# Patient Record
Sex: Female | Born: 1940 | Race: Black or African American | Hispanic: No | Smoking: Former smoker
Health system: Southern US, Community
[De-identification: ages and names within clinical notes are randomized; demographics above are authoritative.]

## PROBLEM LIST (undated history)

## (undated) DIAGNOSIS — I639 Cerebral infarction, unspecified: Secondary | ICD-10-CM

## (undated) DIAGNOSIS — M109 Gout, unspecified: Secondary | ICD-10-CM

## (undated) DIAGNOSIS — F329 Major depressive disorder, single episode, unspecified: Secondary | ICD-10-CM

## (undated) DIAGNOSIS — K219 Gastro-esophageal reflux disease without esophagitis: Secondary | ICD-10-CM

## (undated) DIAGNOSIS — J449 Chronic obstructive pulmonary disease, unspecified: Secondary | ICD-10-CM

## (undated) DIAGNOSIS — G4733 Obstructive sleep apnea (adult) (pediatric): Secondary | ICD-10-CM

## (undated) DIAGNOSIS — D649 Anemia, unspecified: Secondary | ICD-10-CM

## (undated) DIAGNOSIS — I10 Essential (primary) hypertension: Secondary | ICD-10-CM

## (undated) DIAGNOSIS — F419 Anxiety disorder, unspecified: Secondary | ICD-10-CM

## (undated) DIAGNOSIS — E119 Type 2 diabetes mellitus without complications: Secondary | ICD-10-CM

## (undated) DIAGNOSIS — H409 Unspecified glaucoma: Secondary | ICD-10-CM

## (undated) DIAGNOSIS — M1990 Unspecified osteoarthritis, unspecified site: Secondary | ICD-10-CM

## (undated) DIAGNOSIS — F32A Depression, unspecified: Secondary | ICD-10-CM

## (undated) HISTORY — PX: FOOT SURGERY: SHX648

## (undated) HISTORY — DX: Cerebral infarction, unspecified: I63.9

## (undated) HISTORY — DX: Obstructive sleep apnea (adult) (pediatric): G47.33

## (undated) HISTORY — DX: Unspecified glaucoma: H40.9

## (undated) HISTORY — PX: ABDOMINAL HYSTERECTOMY: SUR658

## (undated) HISTORY — PX: SHOULDER ARTHROSCOPY: SHX128

## (undated) HISTORY — PX: BACK SURGERY: SHX140

## (undated) HISTORY — DX: Essential (primary) hypertension: I10

## (undated) HISTORY — PX: BUNIONECTOMY: SHX129

## (undated) HISTORY — DX: Chronic obstructive pulmonary disease, unspecified: J44.9

## (undated) HISTORY — DX: Gastro-esophageal reflux disease without esophagitis: K21.9

## (undated) HISTORY — PX: KNEE SURGERY: SHX244

## (undated) HISTORY — PX: JOINT REPLACEMENT: SHX530

---

## 1998-04-25 ENCOUNTER — Emergency Department (HOSPITAL_COMMUNITY): Admission: EM | Admit: 1998-04-25 | Discharge: 1998-04-25 | Payer: Self-pay | Admitting: *Deleted

## 1998-04-25 ENCOUNTER — Encounter: Payer: Self-pay | Admitting: *Deleted

## 1998-04-25 ENCOUNTER — Encounter: Payer: Self-pay | Admitting: Orthopedic Surgery

## 1998-11-26 ENCOUNTER — Ambulatory Visit (HOSPITAL_COMMUNITY): Admission: RE | Admit: 1998-11-26 | Discharge: 1998-11-26 | Payer: Self-pay | Admitting: Orthopedic Surgery

## 1998-12-27 ENCOUNTER — Other Ambulatory Visit: Admission: RE | Admit: 1998-12-27 | Discharge: 1998-12-27 | Payer: Self-pay | Admitting: Internal Medicine

## 2000-01-11 ENCOUNTER — Encounter: Payer: Self-pay | Admitting: Internal Medicine

## 2000-01-11 ENCOUNTER — Encounter: Admission: RE | Admit: 2000-01-11 | Discharge: 2000-01-11 | Payer: Self-pay | Admitting: Internal Medicine

## 2000-03-19 ENCOUNTER — Encounter: Admission: RE | Admit: 2000-03-19 | Discharge: 2000-03-19 | Payer: Self-pay | Admitting: Internal Medicine

## 2000-03-19 ENCOUNTER — Encounter: Payer: Self-pay | Admitting: Internal Medicine

## 2000-05-10 ENCOUNTER — Encounter: Payer: Self-pay | Admitting: *Deleted

## 2000-05-15 ENCOUNTER — Inpatient Hospital Stay (HOSPITAL_COMMUNITY): Admission: EM | Admit: 2000-05-15 | Discharge: 2000-05-17 | Payer: Self-pay | Admitting: *Deleted

## 2001-01-03 ENCOUNTER — Encounter: Payer: Self-pay | Admitting: Orthopedic Surgery

## 2001-01-03 ENCOUNTER — Inpatient Hospital Stay (HOSPITAL_COMMUNITY): Admission: RE | Admit: 2001-01-03 | Discharge: 2001-01-07 | Payer: Self-pay | Admitting: Orthopedic Surgery

## 2001-01-07 ENCOUNTER — Inpatient Hospital Stay (HOSPITAL_COMMUNITY)
Admission: RE | Admit: 2001-01-07 | Discharge: 2001-01-25 | Payer: Self-pay | Admitting: Physical Medicine & Rehabilitation

## 2001-01-10 ENCOUNTER — Encounter: Payer: Self-pay | Admitting: Physical Medicine & Rehabilitation

## 2001-05-14 ENCOUNTER — Encounter: Payer: Self-pay | Admitting: Orthopedic Surgery

## 2001-05-16 ENCOUNTER — Encounter: Payer: Self-pay | Admitting: Orthopedic Surgery

## 2001-05-16 ENCOUNTER — Inpatient Hospital Stay (HOSPITAL_COMMUNITY): Admission: RE | Admit: 2001-05-16 | Discharge: 2001-05-22 | Payer: Self-pay | Admitting: Orthopedic Surgery

## 2001-05-19 ENCOUNTER — Encounter: Payer: Self-pay | Admitting: Orthopedic Surgery

## 2001-08-27 ENCOUNTER — Encounter: Admission: RE | Admit: 2001-08-27 | Discharge: 2001-08-27 | Payer: Self-pay | Admitting: Internal Medicine

## 2001-08-27 ENCOUNTER — Encounter: Payer: Self-pay | Admitting: Internal Medicine

## 2001-09-18 ENCOUNTER — Encounter: Admission: RE | Admit: 2001-09-18 | Discharge: 2001-12-17 | Payer: Self-pay | Admitting: Orthopedic Surgery

## 2013-02-20 ENCOUNTER — Other Ambulatory Visit (HOSPITAL_COMMUNITY): Payer: Self-pay | Admitting: Internal Medicine

## 2013-02-20 DIAGNOSIS — Z139 Encounter for screening, unspecified: Secondary | ICD-10-CM

## 2013-02-24 ENCOUNTER — Ambulatory Visit (HOSPITAL_COMMUNITY)
Admission: RE | Admit: 2013-02-24 | Discharge: 2013-02-24 | Disposition: A | Discharge status: Home / Self Care | Payer: Medicare Other | Source: Ambulatory Visit | Attending: Internal Medicine | Admitting: Internal Medicine

## 2013-02-24 DIAGNOSIS — Z139 Encounter for screening, unspecified: Secondary | ICD-10-CM

## 2013-02-24 DIAGNOSIS — Z1231 Encounter for screening mammogram for malignant neoplasm of breast: Secondary | ICD-10-CM | POA: Insufficient documentation

## 2013-04-03 ENCOUNTER — Ambulatory Visit (HOSPITAL_COMMUNITY)
Admission: RE | Admit: 2013-04-03 | Discharge: 2013-04-03 | Disposition: A | Discharge status: Home / Self Care | Payer: Medicare HMO | Source: Ambulatory Visit | Attending: Internal Medicine | Admitting: Internal Medicine

## 2013-04-03 ENCOUNTER — Other Ambulatory Visit (HOSPITAL_COMMUNITY): Payer: Self-pay | Admitting: Internal Medicine

## 2013-04-03 DIAGNOSIS — R52 Pain, unspecified: Secondary | ICD-10-CM

## 2013-04-03 DIAGNOSIS — M25519 Pain in unspecified shoulder: Secondary | ICD-10-CM | POA: Insufficient documentation

## 2013-04-03 DIAGNOSIS — M259 Joint disorder, unspecified: Secondary | ICD-10-CM | POA: Insufficient documentation

## 2013-05-22 ENCOUNTER — Ambulatory Visit (INDEPENDENT_AMBULATORY_CARE_PROVIDER_SITE_OTHER): Payer: Medicare HMO | Admitting: Orthopedic Surgery

## 2013-05-22 ENCOUNTER — Encounter: Payer: Self-pay | Admitting: Orthopedic Surgery

## 2013-05-22 VITALS — BP 128/70 | Ht 63.0 in | Wt 190.0 lb

## 2013-05-22 DIAGNOSIS — M719 Bursopathy, unspecified: Secondary | ICD-10-CM

## 2013-05-22 DIAGNOSIS — M75100 Unspecified rotator cuff tear or rupture of unspecified shoulder, not specified as traumatic: Secondary | ICD-10-CM | POA: Insufficient documentation

## 2013-05-22 DIAGNOSIS — M67919 Unspecified disorder of synovium and tendon, unspecified shoulder: Secondary | ICD-10-CM

## 2013-05-22 NOTE — Progress Notes (Signed)
Patient ID: Heather [name], female   DOB: Apr 07, 1940, 73 y.o.   MRN: [id number]  SUBJECTIVE: Heather [name] is a 73 y.o. female presents with onset of pain in the left shoulder x2 months associated with decreased range of motion. She complains of sharp throbbing 10 out of 10 constant pain which wakes her up at night. She does get relief from heat. She has painful for elevation.  Her review of systems is notable for blurred vision wheezing heartburn joint pain swelling itching depression and seasonal allergies. The remaining systems were reviewed and were negative  The past, family history and social history have been reviewed and are recorded in the corresponding sections of epic   OBJECTIVE: Vital signs as noted above. Appearance: alert, well appearing, and in no distress, oriented to person, place, and time and normal appearing weight. Shoulder exam: soft tissue tenderness at the subacromial space, reduced range of motion for elevation internal rotation, subdeltoid tenderness, positive impingement signs, contralateral shoulder exam is normal. 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  ASSESSMENT: Shoulder impingement syndrome  PLAN: Recommend subacromial injection, physical therapy  Return in 6 weeks if no improvement consider MRI  Inject left subacromial space Procedure inject subacromial space left shoulder Diagnosis rotator cuff syndrome left shoulder Medication Depo-Medrol 40 mg, 1 cc and lidocaine 1% 3 cc Verbal consent Timeout completed  The injection site was cleaned with alcohol and sprayed with ethyl chloride. From a posterior approach a 20-gauge needle was injected in the subacromial space. The medication went in easily. There were no complications. The wound was covered with a sterile bandage. Appropriate precautions were given.

## 2013-05-22 NOTE — Patient Instructions (Addendum)
  Call to arrange therapy at [facility]    Joint Injection  Care After  Refer to this sheet in the next few days. These instructions provide you with information on caring for yourself after you have had a joint injection. Your caregiver also may give you more specific instructions. Your treatment has been planned according to current medical practices, but problems sometimes occur. Call your caregiver if you have any problems or questions after your procedure.  After any type of joint injection, it is not uncommon to experience:  Soreness, swelling, or bruising around the injection site.  Mild numbness, tingling, or weakness around the injection site caused by the numbing medicine used before or with the injection. It also is possible to experience the following effects associated with the specific agent after injection:  Iodine-based contrast agents:  Allergic reaction (itching, hives, widespread redness, and swelling beyond the injection site).  Corticosteroids (These effects are rare.):  Allergic reaction.  Increased blood sugar levels (If you have diabetes and you notice that your blood sugar levels have increased, notify your caregiver).  Increased blood pressure levels.  Mood swings.  Hyaluronic acid in the use of viscosupplementation.  Temporary heat or redness.  Temporary rash and itching.  Increased fluid accumulation in the injected joint. These effects all should resolve within a day after your procedure.  HOME CARE INSTRUCTIONS  Limit yourself to light activity the day of your procedure. Avoid lifting heavy objects, bending, stooping, or twisting.  Take prescription or over-the-counter pain medication as directed by your caregiver.  You may apply ice to your injection site to reduce pain and swelling the day of your procedure. Ice may be applied 3-4 times:  Put ice in a plastic bag.  Place a towel between your skin and the bag.  Leave the ice on for no longer than 15-20  minutes each time. SEEK IMMEDIATE MEDICAL CARE IF:  Pain and swelling get worse rather than better or extend beyond the injection site.  Numbness does not go away.  Blood or fluid continues to leak from the injection site.  You have chest pain.  You have swelling of your face or tongue.  You have trouble breathing or you become dizzy.  You develop a fever, chills, or severe tenderness at the injection site that last longer than 1 day. MAKE SURE YOU:  Understand these instructions.  Watch your condition.  Get help right away if you are not doing well or if you get worse. Document Released: 11/03/2010 Document Revised: 05/15/2011 Document Reviewed: 11/03/2010  ExitCare Patient Information 2014 [employer]   

## 2013-05-26 ENCOUNTER — Ambulatory Visit (HOSPITAL_COMMUNITY)
Admission: RE | Admit: 2013-05-26 | Discharge: 2013-05-26 | Disposition: A | Discharge status: Home / Self Care | Payer: Medicare HMO | Source: Ambulatory Visit | Attending: Orthopedic Surgery | Admitting: Orthopedic Surgery

## 2013-05-26 DIAGNOSIS — J449 Chronic obstructive pulmonary disease, unspecified: Secondary | ICD-10-CM | POA: Insufficient documentation

## 2013-05-26 DIAGNOSIS — IMO0001 Reserved for inherently not codable concepts without codable children: Secondary | ICD-10-CM | POA: Insufficient documentation

## 2013-05-26 DIAGNOSIS — M25519 Pain in unspecified shoulder: Secondary | ICD-10-CM | POA: Insufficient documentation

## 2013-05-26 DIAGNOSIS — M6281 Muscle weakness (generalized): Secondary | ICD-10-CM | POA: Insufficient documentation

## 2013-05-26 DIAGNOSIS — I10 Essential (primary) hypertension: Secondary | ICD-10-CM | POA: Insufficient documentation

## 2013-05-26 DIAGNOSIS — J4489 Other specified chronic obstructive pulmonary disease: Secondary | ICD-10-CM | POA: Insufficient documentation

## 2013-05-26 NOTE — Evaluation (Signed)
Occupational Therapy Evaluation  Patient Details  Name: Heather MoodDianne J Lachney MRN: [id number] Date of Birth: 01/18/1941  Today's Date: 05/26/2013 Time: [id number] OT Time Calculation (min): 30 min OT eval [phone] 30'  Visit#: 1 of 8  Re-eval: 06/23/13  Assessment Diagnosis: Left shoulder pain Next MD Visit: 07/03/13 Romeo AppleHarrison Prior Therapy: None  Authorization: [employer]  Authorization Time Period: prior to 10th visit  Authorization Visit#: 1 of 10   Past Medical History:  Past Medical History  Diagnosis Date  . HTN (hypertension)   . COPD (chronic obstructive pulmonary disease)   . GERD (gastroesophageal reflux disease)   . Glaucoma    Past Surgical History:  Past Surgical History  Procedure Laterality Date  . Joint replacement Bilateral     hip   . Joint replacement Right     knee  . Shoulder arthroscopy Right   . Abdominal hysterectomy    . Back surgery    . Foot surgery      Subjective Symptoms/Limitations Symptoms: S: I've been trying to deal with the pain for a while but it just won't go away. Pertinent History: Ms. Fayrene FearingJames has been experiencing pain and decreased joint mobility in left shoulder for approximately 3 months. Patient received a cortizone shot on 05/22/13 which has helped with the pain. Dr. Romeo AppleHarrison has referred patient for evaluation and treatment.  Limitations: washing dishes, reaching up into cabinet, putting on shirt, washing her back, unable to go to the Gym. Special Tests: FOTO score: 64/100 (36% limited) Patient Stated Goals: To be able to gain back functional use of her arm. Pain Assessment Currently in Pain?: No/denies (Pt takes regular pain meds. Pain can get up to 10/10)  Precautions/Restrictions  Precautions Precautions: None  Balance Screening Balance Screen Has the patient fallen in the past 6 months: No  Prior Functioning  Home Living Family/patient expects to be discharged to:: Private residence Living Arrangements: Other  relatives (sister) Prior Function Level of Independence: Independent with basic ADLs;Independent with gait  Able to Take Stairs?: Yes Driving: Yes Vocation: Retired Leisure: Hobbies-yes (Comment) Comments: reading, going to the gym (Which she has been unable since her shoulder pain)  Assessment ADL/Vision/Perception ADL ADL Comments: Difficulty with reaching into cabinets, donning shirt, washing dishes, washing her back, unable to go to the gym Dominant Hand: Right Vision - History Baseline Vision: No visual deficits  Cognition/Observation Cognition Overall Cognitive Status: Within Functional Limits for tasks assessed Arousal/Alertness: Awake/alert Orientation Level: Oriented X4  Additional Assessments RUE Assessment RUE Assessment: Within Functional Limits LUE Assessment LUE Assessment:  (assessed seated. IR/ER Adducted) LUE AROM (degrees) Left Shoulder Flexion: 110 Degrees Left Shoulder ABduction: 105 Degrees Left Shoulder Internal Rotation: 90 Degrees Left Shoulder External Rotation: 55 Degrees LUE PROM (degrees) Left Shoulder Flexion: 135 Degrees Left Shoulder ABduction: 120 Degrees Left Shoulder Internal Rotation: 90 Degrees Left Shoulder External Rotation: 55 Degrees LUE Strength Left Shoulder Flexion: 3-/5 Left Shoulder ABduction: 3-/5 Left Shoulder Internal Rotation: 3/5 Left Shoulder External Rotation: 3/5 Palpation Palpation: Max fascial restrictions in left upper arm, trapezius, and scapularis region.      Occupational Therapy Assessment and Plan OT Assessment and Plan Clinical Impression Statement: A: Patient is a 73 y/o female presenting to occupational therapy with left shoulder pain that has been causing a decreased AROM, increased pain and fascial restrictions creating difficulty when completing B/IADL activities.  Pt will benefit from skilled therapeutic intervention in order to improve on the following deficits: Impaired UE functional use;Increased  fascial restricitons;Decreased range  of motion;Pain;Decreased strength Rehab Potential: Excellent OT Frequency: Min 2X/week OT Duration: 4 weeks OT Treatment/Interventions: Therapeutic activities;Self-care/ADL training;Therapeutic exercise;Patient/family education;DME and/or AE instruction;Manual therapy;Modalities OT Plan: P: Pt will benefit from skilled OT services to decrease pain, increase range of motion, increased strength, decrease fascial restrticions, and improve overall ADL status. Treatment Plan: PROM, AAROM, AROM, MFR and manual stretching, scapular strengthening, proximal stabilization, and overall LUE strengthening, e-stim/modalities PRN for pain. Progress as tolerated    Goals Short Term Goals Time to Complete Short Term Goals: 2 weeks Short Term Goal 1: Patient will be educated on HEP.  Short Term Goal 2: Patient willl increase PROM of left shoulder to [facility] to increase ability to donn shirt with greater ease.  Short Term Goal 3: Patient will decrease pain level to 5/10 when completing daily activities.  Short Term Goal 4: Patient will increase left shoulder strength to 4+/5 to increase ability to raise arm up overhead into cabinets. Short Term Goal 5: Patient will decrease fascial restrictions to moderate. Long Term Goals Time to Complete Long Term Goals: 4 weeks Long Term Goal 1: Patient will return to highest level of indenpendence with all B/ADL and leisure activities.  Long Term Goal 2: Patient will increase AROM of left shoulder to [facility] to increase ability to wash her back. Long Term Goal 3: Patient will decrease pain level to 2/10 or less when completing daily activities.  Long Term Goal 4: Patient will increase left shoulder strength to 4+/5 to increase ability to do dishes and lift them into cabinets. Long Term Goal 5: Patient will decrease fascial restrictions to minimum.   Problem List Patient Active Problem List   Diagnosis Date Noted  . Muscle weakness  (generalized) 05/26/2013  . Pain in joint, shoulder region 05/26/2013  . Rotator cuff syndrome 05/22/2013    End of Session Activity Tolerance: Patient tolerated treatment well General Behavior During Therapy: [facility] for tasks assessed/performed OT Plan of Care OT Home Exercise Plan: towel slides and shoulder stretches OT Patient Instructions: Handout (scanned) Consulted and Agree with Plan of Care: Patient  GO Functional Assessment Tool Used: FOTO score: 64/100 (36% impaired) Functional Limitation: Carrying, moving and handling objects Carrying, Moving and Handling Objects Current Status ([id number]): At least 20 percent but less than 40 percent impaired, limited or restricted Carrying, Moving and Handling Objects Goal Status [phone]): At least 1 percent but less than 20 percent impaired, limited or restricted  Limmie Patricia, OTR/L,CBIS   05/26/2013, 11:48 AM  Physician Documentation Your signature is required to indicate approval of the treatment plan as stated above.  Please sign and either send electronically or make a copy of this report for your files and return this physician signed original.  Please mark one 1.__approve of plan  2. ___approve of plan with the following conditions.   ______________________________                                                          _____________________ Physician Signature  Date  

## 2013-05-28 ENCOUNTER — Ambulatory Visit (HOSPITAL_COMMUNITY)
Admission: RE | Admit: 2013-05-28 | Discharge: 2013-05-28 | Disposition: A | Discharge status: Home / Self Care | Payer: Medicare HMO | Source: Ambulatory Visit | Attending: Internal Medicine | Admitting: Internal Medicine

## 2013-05-28 NOTE — Progress Notes (Signed)
Occupational Therapy Treatment Patient Details  Name: Heather [name] MRN: [id number] Date of Birth: 29-Oct-1940  Today's Date: 05/28/2013 Time: 1018-1058 OT Time Calculation (min): 40 min Manual [id number] (24') Therapeutic Exercises 1042-1058 (16')  Visit#: 2 of 8  Re-eval:      Authorization: [employer]  Authorization Time Period: prior to 10th visit  Authorization Visit#: 2 of 10  Subjective Symptoms/Limitations Symptoms: "Its better today." Limitations: washing dishes, reaching up into cabinet, putting on shirt, washing her back, unable to go to the Gym. Pain Assessment Currently in Pain?: Yes Pain Score: 8  Pain Location: Shoulder Pain Orientation: Left Pain Type: Chronic pain  Precautions/Restrictions     Exercise/Treatments Supine Protraction: PROM;10 reps Horizontal ABduction: PROM;10 reps External Rotation: PROM;10 reps Internal Rotation: PROM;10 reps Flexion: PROM;10 reps ABduction: PROM;10 reps Seated Elevation: AROM;12 reps Extension: AROM;12 reps Row: AROM;12 reps ROM / Strengthening / Isometric Strengthening   Flexion: 3X3" Extension: 3X3" External Rotation: 3X3" Internal Rotation: 3X3" ABduction: 3X3" ADduction: 3X3"     Manual Therapy Manual Therapy: Myofascial release Myofascial Release: MFR and manual stretching to upper arm, bicep and tricep, trap, and scapular regions to decrease tightness and increase pain free range of motion  Occupational Therapy Assessment and Plan OT Assessment and Plan Clinical Impression Statement: Pt tolerated well PROM this session, having pain with flexion this date. Pt had no increased pain with isometrics or seated scapular AROM. Pt indicated her pain ahd decreased from inital 8/10 by end of session. OT Plan: PROM, attempt AAROM, e-stim PRN for pain, ball stretches   Goals Short Term Goals Short Term Goal 1: Patient will be educated on HEP.  Short Term Goal 1 Progress: Progressing toward  goal Short Term Goal 2: Patient willl increase PROM of left shoulder to [facility] to increase ability to donn shirt with greater ease.  Short Term Goal 2 Progress: Progressing toward goal Short Term Goal 3: Patient will decrease pain level to 5/10 when completing daily activities.  Short Term Goal 3 Progress: Progressing toward goal Short Term Goal 4: Patient will increase left shoulder strength to 4+/5 to increase ability to raise arm up overhead into cabinets. Short Term Goal 4 Progress: Progressing toward goal Short Term Goal 5: Patient will decrease fascial restrictions to moderate. Short Term Goal 5 Progress: Progressing toward goal Long Term Goals Long Term Goal 1: Patient will return to highest level of indenpendence with all B/ADL and leisure activities.  Long Term Goal 1 Progress: Progressing toward goal Long Term Goal 2: Patient will increase AROM of left shoulder to  [facility] to increase ability to wash her back. Long Term Goal 2 Progress: Progressing toward goal Long Term Goal 3: Patient will decrease pain level to 2/10 or less when completing daily activities.  Long Term Goal 3 Progress: Progressing toward goal Long Term Goal 4: Patient will increase left shoulder strength to 4+/5 to increase ability to do dishes and lift them into cabinets. Long Term Goal 4 Progress: Progressing toward goal Long Term Goal 5: Patient will decrease fascial restrictions to minimum.  Long Term Goal 5 Progress: Progressing toward goal  Problem List Patient Active Problem List   Diagnosis Date Noted  . Muscle weakness (generalized) 05/26/2013  . Pain in joint, shoulder region 05/26/2013  . Rotator cuff syndrome 05/22/2013    End of Session Activity Tolerance: Patient tolerated treatment well General Behavior During Therapy: [facility] for tasks assessed/performed  GO    Marry Guan, MS, OTR/L [phone]  05/28/2013, 11:55 AM

## 2013-06-03 ENCOUNTER — Ambulatory Visit (HOSPITAL_COMMUNITY)
Admission: RE | Admit: 2013-06-03 | Discharge: 2013-06-03 | Disposition: A | Discharge status: Home / Self Care | Payer: Medicare HMO | Source: Ambulatory Visit | Attending: Internal Medicine | Admitting: Internal Medicine

## 2013-06-03 NOTE — Progress Notes (Signed)
Occupational Therapy Treatment Patient Details  Name: Heather [name] MRN: [id number] Date of Birth: 10/09/1940  Today's Date: 06/03/2013 Time: 4628-6381 OT Time Calculation (min): 39 min Manual therapy 1026-1040 14' Therapeutic exercises 1040-1105 25'  Visit#: 3 of 8  Re-eval: 06/23/13    Authorization: [employer] - [facility] referring MD, no auth needed from [place] Time Period: prior to 10th visit  Authorization Visit#: 3 of 10  Subjective Symptoms/Limitations Symptoms: S:  Its really feeling pretty good.   Precautions/Restrictions   n/a  Exercise/Treatments Supine Protraction: PROM;AROM;10 reps Horizontal ABduction: PROM;AROM;10 reps External Rotation: PROM;AROM;10 reps Internal Rotation: PROM;AROM;10 reps Flexion: PROM;AROM;10 reps ABduction: PROM;AROM;10 reps Pulleys Flexion: 1 minute ABduction: 1 minute Therapy Ball Flexion: 10 reps ABduction: 10 reps Right/Left: 5 reps ROM / Strengthening / Isometric Strengthening Proximal Shoulder Strengthening, Supine: 10 times each with one rest break        Manual Therapy Manual Therapy: Myofascial release Myofascial Release: MFR and manual stretching to upper arm, bicep and tricep, trap, and scapular regions to decrease tightness and increase pain free range of motion  Occupational Therapy Assessment and Plan OT Assessment and Plan Clinical Impression Statement: A:  Patient able to complete AROM in supine this date with 76% AROM or greater.  Completed ball circles without difficulty.  OT Plan: P:  Attempt AROM in seated, and scapular stability exercises such as prot/ret//elev/dep and thumb tacks.    Goals Short Term Goals Short Term Goal 1: Patient will be educated on HEP.  Short Term Goal 1 Progress: Progressing toward goal Short Term Goal 2: Patient willl increase PROM of left shoulder to [facility] to increase ability to donn shirt with greater ease.  Short Term Goal 2 Progress: Met Short Term Goal 3:  Patient will decrease pain level to 5/10 when completing daily activities.  Short Term Goal 3 Progress: Progressing toward goal Short Term Goal 4: Patient will increase left shoulder strength to 4+/5 to increase ability to raise arm up overhead into cabinets. Short Term Goal 4 Progress: Progressing toward goal Short Term Goal 5: Patient will decrease fascial restrictions to moderate. Short Term Goal 5 Progress: Progressing toward goal Long Term Goals Long Term Goal 1: Patient will return to highest level of indenpendence with all B/ADL and leisure activities.  Long Term Goal 1 Progress: Progressing toward goal Long Term Goal 2: Patient will increase AROM of left shoulder to [facility] to increase ability to wash her back. Long Term Goal 2 Progress: Progressing toward goal Long Term Goal 3: Patient will decrease pain level to 2/10 or less when completing daily activities.  Long Term Goal 3 Progress: Progressing toward goal Long Term Goal 4: Patient will increase left shoulder strength to 4+/5 to increase ability to do dishes and lift them into cabinets. Long Term Goal 4 Progress: Progressing toward goal Long Term Goal 5: Patient will decrease fascial restrictions to minimum.  Long Term Goal 5 Progress: Progressing toward goal  Problem List Patient Active Problem List   Diagnosis Date Noted  . Muscle weakness (generalized) 05/26/2013  . Pain in joint, shoulder region 05/26/2013  . Rotator cuff syndrome 05/22/2013    End of Session Activity Tolerance: Patient tolerated treatment well General Behavior During Therapy: [facility] for tasks assessed/performed  GO    Vangie Bicker, OTR/L  06/03/2013, 11:07 AM

## 2013-06-06 ENCOUNTER — Ambulatory Visit (HOSPITAL_COMMUNITY)
Admission: RE | Admit: 2013-06-06 | Discharge: 2013-06-06 | Disposition: A | Discharge status: Home / Self Care | Payer: Medicare HMO | Source: Ambulatory Visit | Attending: Internal Medicine | Admitting: Internal Medicine

## 2013-06-06 DIAGNOSIS — M25519 Pain in unspecified shoulder: Secondary | ICD-10-CM | POA: Insufficient documentation

## 2013-06-06 DIAGNOSIS — IMO0001 Reserved for inherently not codable concepts without codable children: Secondary | ICD-10-CM | POA: Diagnosis not present

## 2013-06-06 DIAGNOSIS — M6281 Muscle weakness (generalized): Secondary | ICD-10-CM | POA: Insufficient documentation

## 2013-06-06 DIAGNOSIS — I10 Essential (primary) hypertension: Secondary | ICD-10-CM | POA: Insufficient documentation

## 2013-06-06 DIAGNOSIS — J449 Chronic obstructive pulmonary disease, unspecified: Secondary | ICD-10-CM | POA: Diagnosis not present

## 2013-06-06 DIAGNOSIS — J4489 Other specified chronic obstructive pulmonary disease: Secondary | ICD-10-CM | POA: Insufficient documentation

## 2013-06-06 NOTE — Progress Notes (Signed)
Occupational Therapy Treatment Patient Details  Name: Heather [name] MRN: [id number] Date of Birth: Feb 28, 1941  Today's Date: 06/06/2013 Time: 3335-4562 OT Time Calculation (min): 43 min Manual therapy 5638-9373 16'  therapeutic exercises 1031-1058 27' Visit#: 4 of 8  Re-eval: 06/23/13    Authorization: [employer] H[employer] - [facility] referring MD, no auth needed from [place] Time Period: prior to 10th visit  Authorization Visit#: 4 of 10  Subjective Symptoms/Limitations Symptoms: S:  Im doing pretty good!  I think the ball exercises hurt my back. Pain Assessment Currently in Pain?: Yes Pain Score: 2  Pain Location: Shoulder Pain Orientation: Left Pain Type: Acute pain  Precautions/Restrictions   progress as tolerated  Exercise/Treatments Supine Protraction: PROM;5 reps;AROM;15 reps Horizontal ABduction: PROM;5 reps;AROM;15 reps External Rotation: PROM;5 reps;AROM;15 reps Internal Rotation: PROM;5 reps;AROM;15 reps Flexion: PROM;5 reps;AROM;15 reps ABduction: PROM;5 reps;AROM;15 reps Seated Protraction: AROM;15 reps (standing) Horizontal ABduction: AROM;10 reps (standing) External Rotation: AROM;15 reps (standing) Internal Rotation: AROM;15 reps (standing) Flexion: AROM;15 reps (standing) Abduction: AROM;15 reps (standing) Standing External Rotation: Theraband;10 reps Theraband Level (Shoulder External Rotation): Level 2 (Red) Internal Rotation: Theraband;10 reps Theraband Level (Shoulder Internal Rotation): Level 2 (Red) Extension: Theraband;10 reps Theraband Level (Shoulder Extension): Level 2 (Red) Row: Theraband;10 reps Theraband Level (Shoulder Row): Level 2 (Red) Retraction: Theraband;10 reps Theraband Level (Shoulder Retraction): Level 2 (Red) Therapy Ball Right/Left:  (dc secondary irritated her back) ROM / Strengthening / Isometric Strengthening Wall Wash: 1' attempted 45" made  Proximal Shoulder Strengthening, Supine: 10 times without  resting Proximal Shoulder Strengthening, Seated: 10 times without resting Prot/Ret//Elev/Dep: 1'      Manual Therapy Manual Therapy: Myofascial release Myofascial Release: MFR and manual stretching to upper arm, bicep and tricep, trap, and scapular regions to decrease tightness and increase pain free range of motion  Occupational Therapy Assessment and Plan OT Assessment and Plan Clinical Impression Statement: A:  Added standing AROM, theraband scapular stability exercises.   OT Plan: P:  Add UBE and improve ability to complete thumbtack exercise for a total of 1 minute.    Goals Short Term Goals Short Term Goal 1: Patient will be educated on HEP.  Short Term Goal 2: Patient willl increase PROM of left shoulder to [facility] to increase ability to donn shirt with greater ease.  Short Term Goal 3: Patient will decrease pain level to 5/10 when completing daily activities.  Short Term Goal 4: Patient will increase left shoulder strength to 4+/5 to increase ability to raise arm up overhead into cabinets. Short Term Goal 5: Patient will decrease fascial restrictions to moderate. Long Term Goals Long Term Goal 1: Patient will return to highest level of indenpendence with all B/ADL and leisure activities.  Long Term Goal 2: Patient will increase AROM of left shoulder to [facility] to increase ability to wash her back. Long Term Goal 3: Patient will decrease pain level to 2/10 or less when completing daily activities.  Long Term Goal 4: Patient will increase left shoulder strength to 4+/5 to increase ability to do dishes and lift them into cabinets. Long Term Goal 5: Patient will decrease fascial restrictions to minimum.   Problem List Patient Active Problem List   Diagnosis Date Noted  . Muscle weakness (generalized) 05/26/2013  . Pain in joint, shoulder region 05/26/2013  . Rotator cuff syndrome 05/22/2013    End of Session Activity Tolerance: Patient tolerated treatment well General Behavior  During Therapy: [facility] for tasks assessed/performed  GO    Vangie Bicker,  OTR/L  06/06/2013, 11:03 AM

## 2013-06-10 ENCOUNTER — Ambulatory Visit (HOSPITAL_COMMUNITY)
Admission: RE | Admit: 2013-06-10 | Discharge: 2013-06-10 | Disposition: A | Discharge status: Home / Self Care | Payer: Medicare HMO | Source: Ambulatory Visit | Attending: Internal Medicine | Admitting: Internal Medicine

## 2013-06-10 DIAGNOSIS — IMO0001 Reserved for inherently not codable concepts without codable children: Secondary | ICD-10-CM | POA: Diagnosis not present

## 2013-06-10 NOTE — Progress Notes (Signed)
Occupational Therapy Treatment Patient Details  Name: Heather [name] MRN: [id number] Date of Birth: 1940-05-27  Today's Date: 06/10/2013 Time: 1027-1106 OT Time Calculation (min): 39 min Manual 1027-1037 (10') Therapeutic Exercises 1037-1106 (29')  Visit#: 5 of 8  Re-eval: 06/23/13    Authorization: [employer] HMO - [facility] referring MD, no auth needed from Normal Time Period: prior to 10th visit  Authorization Visit#: 5 of 10  Subjective Symptoms/Limitations Symptoms: "i think its almost gone." Pain Assessment Currently in Pain?: Yes Pain Score: 1  Pain Location: Shoulder Pain Orientation: Left Pain Type: Acute pain  Precautions/Restrictions     Exercise/Treatments Supine Protraction: PROM;5 reps;AROM;15 reps Horizontal ABduction: PROM;5 reps;AROM;15 reps External Rotation: PROM;5 reps;AROM;15 reps Internal Rotation: PROM;5 reps;AROM;15 reps Flexion: PROM;5 reps;AROM;15 reps ABduction: PROM;5 reps;AROM;15 reps Seated Protraction: AROM;15 reps Horizontal ABduction: AROM;15 reps External Rotation: AROM;15 reps Internal Rotation: AROM;15 reps Flexion: AROM;15 reps Abduction: AROM;15 reps     ROM / Strengthening / Isometric Strengthening UBE (Upper Arm Bike): 2' forward and back at 1.0 Thumb Tacks: attempted 1' and achieved. Proximal Shoulder Strengthening, Supine: 10 times with rest between up/down, criss/cross, and circles    Manual Therapy Manual Therapy: Myofascial release Myofascial Release: MFR and manual stretching to upper arm, bicep and tricep, trap, and scapular regions to decrease tightness and increase pain free range of motion.    Occupational Therapy Assessment and Plan OT Assessment and Plan Clinical Impression Statement: Pt toelrated well all exercises.  Able to toelrated full minute with thumbtacks this session, improvemtn from 45" previously. Added UBE this session - pt tolerated well. OT Plan: Add 1# to supine strengthening.    Goals Short Term Goals Short Term Goal 1: Patient will be educated on HEP.  Short Term Goal 1 Progress: Progressing toward goal Short Term Goal 2: Patient willl increase PROM of left shoulder to [facility] to increase ability to donn shirt with greater ease.  Short Term Goal 2 Progress: Met Short Term Goal 3: Patient will decrease pain level to 5/10 when completing daily activities.  Short Term Goal 3 Progress: Progressing toward goal Short Term Goal 4: Patient will increase left shoulder strength to 4+/5 to increase ability to raise arm up overhead into cabinets. Short Term Goal 4 Progress: Progressing toward goal Short Term Goal 5: Patient will decrease fascial restrictions to moderate. Short Term Goal 5 Progress: Progressing toward goal Long Term Goals Long Term Goal 1: Patient will return to highest level of indenpendence with all B/ADL and leisure activities.  Long Term Goal 1 Progress: Progressing toward goal Long Term Goal 2: Patient will increase AROM of left shoulder to [facility] to increase ability to wash her back. Long Term Goal 2 Progress: Progressing toward goal Long Term Goal 3: Patient will decrease pain level to 2/10 or less when completing daily activities.  Long Term Goal 3 Progress: Progressing toward goal Long Term Goal 4: Patient will increase left shoulder strength to 4+/5 to increase ability to do dishes and lift them into cabinets. Long Term Goal 4 Progress: Progressing toward goal Long Term Goal 5: Patient will decrease fascial restrictions to minimum.  Long Term Goal 5 Progress: Progressing toward goal  Problem List Patient Active Problem List   Diagnosis Date Noted  . Muscle weakness (generalized) 05/26/2013  . Pain in joint, shoulder region 05/26/2013  . 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

## 2013-06-12 ENCOUNTER — Ambulatory Visit (HOSPITAL_COMMUNITY)
Admission: RE | Admit: 2013-06-12 | Discharge: 2013-06-12 | Disposition: A | Discharge status: Home / Self Care | Payer: Medicare HMO | Source: Ambulatory Visit | Attending: Internal Medicine | Admitting: Internal Medicine

## 2013-06-12 DIAGNOSIS — IMO0001 Reserved for inherently not codable concepts without codable children: Secondary | ICD-10-CM | POA: Diagnosis not present

## 2013-06-12 NOTE — Progress Notes (Signed)
Occupational Therapy Treatment Patient Details  Name: WANETA BOSSERT MRN: [id number] Date of Birth: 01-30-1941  Today's Date: 06/12/2013 Time: 1561-5379 OT Time Calculation (min): 41 min Manual therapy 4327-6147 13' Therapeutic exercises 1037-1105 28'  Visit#: 6 of 8  Re-eval: 06/23/13    Authorization: [employer] HMO - [facility] referring MD, no auth needed from [facility]  Authorization Time Period: prior to 10th visit  Authorization Visit#: 6 of 10  Subjective S:  I am feeling better. Pain Assessment Currently in Pain?: Yes Pain Score: 1  Pain Location: Shoulder Pain Orientation: Left Pain Type: Acute pain  Precautions/Restrictions   progress as tolerated  Exercise/Treatments Supine Protraction: PROM;Strengthening;10 reps Protraction Limitations: 1 Horizontal ABduction: PROM;Strengthening;10 reps Horizontal ABduction Weight (lbs): 1 External Rotation: PROM;Strengthening;10 reps External Rotation Weight (lbs): 1 Internal Rotation: PROM;Strengthening;10 reps Internal Rotation Weight (lbs): 1 Flexion: PROM;Strengthening;10 reps Shoulder Flexion Weight (lbs): 1 ABduction: PROM;Strengthening;10 reps Shoulder ABduction Weight (lbs): 1 Standing Protraction: Strengthening;10 reps Protraction Weight (lbs): 1 Horizontal ABduction: Strengthening;10 reps Horizontal ABduction Weight (lbs): 1 External Rotation: Strengthening;10 reps External Rotation Weight (lbs): 1 Internal Rotation: Strengthening;10 reps Internal Rotation Weight (lbs): 1 Flexion: Strengthening;10 reps Shoulder Flexion Weight (lbs): 1 ABduction: Strengthening;10 reps Shoulder ABduction Weight (lbs): 1 ROM / Strengthening / Isometric Strengthening UBE (Upper Arm Bike): 3' forward and 3' in reverse at 1.0 Wall Wash: 1' with 1#  "W" Arms: 10 times each X to V Arms: 10 times each Proximal Shoulder Strengthening, Supine: 10 times with 1# Proximal Shoulder Strengthening, Seated: 10 times with 1#      Manual  Therapy Manual Therapy: Myofascial release Myofascial Release: MFR and manual stretching to left upper arm, bicep and tricep, trap, and scapular regions to decrease tightness and increase pain free range of motion.   Occupational Therapy Assessment and Plan OT Assessment and Plan Clinical Impression Statement: A:  Added 1# to supine and standing exercises - required consistent cueing and tactile cues to depress shoulder blade during standing exercises.  OT Plan: P:  Attempt 2' with weighted wall wash, decrease amount of cuing needed to depress shoulder blade during standing exercises.   Goals Short Term Goals Short Term Goal 1: Patient will be educated on HEP.  Short Term Goal 2: Patient willl increase PROM of left shoulder to [facility] to increase ability to donn shirt with greater ease.  Short Term Goal 3: Patient will decrease pain level to 5/10 when completing daily activities.  Short Term Goal 4: Patient will increase left shoulder strength to 4+/5 to increase ability to raise arm up overhead into cabinets. Short Term Goal 5: Patient will decrease fascial restrictions to moderate. Long Term Goals Long Term Goal 1: Patient will return to highest level of indenpendence with all B/ADL and leisure activities.  Long Term Goal 2: Patient will increase AROM of left shoulder to [facility] to increase ability to wash her back. Long Term Goal 3: Patient will decrease pain level to 2/10 or less when completing daily activities.  Long Term Goal 4: Patient will increase left shoulder strength to 4+/5 to increase ability to do dishes and lift them into cabinets. Long Term Goal 5: Patient will decrease fascial restrictions to minimum.   Problem List Patient Active Problem List   Diagnosis Date Noted  . Muscle weakness (generalized) 05/26/2013  . Pain in joint, shoulder region 05/26/2013  . Rotator cuff syndrome 05/22/2013    End of Session Activity Tolerance: Patient tolerated treatment  well General Behavior During Therapy: [facility] for tasks assessed/performed  GO    Shirlean MylarBethany H. Murray, OTR/L  06/12/2013, 11:05 AM

## 2013-06-17 ENCOUNTER — Ambulatory Visit (HOSPITAL_COMMUNITY)
Admission: RE | Admit: 2013-06-17 | Discharge: 2013-06-17 | Disposition: A | Discharge status: Home / Self Care | Payer: Medicare HMO | Source: Ambulatory Visit | Attending: Internal Medicine | Admitting: Internal Medicine

## 2013-06-17 DIAGNOSIS — IMO0001 Reserved for inherently not codable concepts without codable children: Secondary | ICD-10-CM | POA: Diagnosis not present

## 2013-06-17 NOTE — Progress Notes (Signed)
Occupational Therapy Treatment Patient Details  Name: Wyline MoodDianne J Hilyard MRN: [id number] Date of Birth: 03/23/1940  Today's Date: 06/17/2013 Time: 4098-11911022-1104 OT Time Calculation (min): 42 min Manual therapy [id number] 13' Therapeutic exercises 1308-65781035-1104 29' Visit#: 7 of 8  Re-eval: 06/23/13    Authorization: [employer] HMO - [facility], no auth [facility] from [facility]  Authorization Time Period: prior to 10th visit  Authorization Visit#: 7 of 10  Subjective  S:  I am using my arm more.  [facility]  Pain Assessment Currently in Pain?: No/denies Pain Score: 0-No pain   Exercise/Treatments Supine Protraction: PROM;Strengthening;10 reps Protraction Limitations: 2 Horizontal ABduction: PROM;Strengthening;10 reps Horizontal ABduction Weight (lbs): 2 External Rotation: PROM;Strengthening;10 reps External Rotation Weight (lbs): 2 Internal Rotation: PROM;Strengthening;10 reps Internal Rotation Weight (lbs): 2 Flexion: PROM;Strengthening;10 reps Shoulder Flexion Weight (lbs): 2 ABduction: PROM;Strengthening;10 reps Shoulder ABduction Weight (lbs): 2 Standing Protraction: Strengthening;10 reps Protraction Weight (lbs): 2 Horizontal ABduction: Strengthening;10 reps (min assist from OT to hold weight of arm) Horizontal ABduction Weight (lbs): 2 External Rotation: Strengthening;10 reps External Rotation Weight (lbs): 2 Internal Rotation: Strengthening;10 reps Internal Rotation Weight (lbs): 2 Flexion: Strengthening;10 reps Shoulder Flexion Weight (lbs): 2 ABduction: Strengthening;10 reps Shoulder ABduction Weight (lbs): 2 ROM / Strengthening / Isometric Strengthening UBE (Upper Arm Bike): 3' forward and 3' in reverse at 1.5 Wall Wash: 2' with 1# "W" Arms: 10 times each X to V Arms: 10 times each Proximal Shoulder Strengthening, Supine: 10 times each with 2# Proximal Shoulder Strengthening, Seated: 10 times each Ball on Wall: 1' with arm flexed  to 90 and 1' with arm abducted to 90          Manual Therapy Manual Therapy: Myofascial release Myofascial Release: Myofascial release (MFR) and manual stretching to left upper arm, bicep and tricep, trap, and scapular regions to decrease tightness and increase pain free range of motion.  Occupational Therapy Assessment and Plan OT Assessment and Plan Clinical Impression Statement: A: Improved to 2# strengthening in supine and seated.  Required assistance with horizontal abduction and abduction.   OT Plan: P:  Reassess, complete abduction and horizontal abduction without assistance from OTR/L   Goals Short Term Goals Short Term Goal 1: Patient will be educated on HEP.  Short Term Goal 2: Patient willl increase PROM of left shoulder to [facility] to increase ability to donn shirt with greater ease.  Short Term Goal 3: Patient will decrease pain level to 5/10 when completing daily activities.  Short Term Goal 4: Patient will increase left shoulder strength to 4+/5 to increase ability to raise arm up overhead into cabinets. Short Term Goal 5: Patient will decrease fascial restrictions to moderate. Long Term Goals Long Term Goal 1: Patient will return to highest level of indenpendence with all B/ADL and leisure activities.  Long Term Goal 2: Patient will increase AROM of left shoulder to [facility] to increase ability to wash her back. Long Term Goal 3: Patient will decrease pain level to 2/10 or less when completing daily activities.  Long Term Goal 4: Patient will increase left shoulder strength to 4+/5 to increase ability to do dishes and lift them into cabinets. Long Term Goal 5: Patient will decrease fascial restrictions to minimum.   Problem List Patient Active Problem List   Diagnosis Date Noted  . Muscle weakness (generalized) 05/26/2013  . Pain in joint, shoulder region 05/26/2013  . Rotator cuff syndrome 05/22/2013    End of Session Activity Tolerance:  Patient tolerated treatment  well General Behavior During Therapy: [facility] for tasks assessed/performed  GO    Shirlean Mylar, OTR/L  06/17/2013, 11:01 AM

## 2013-06-19 ENCOUNTER — Ambulatory Visit (HOSPITAL_COMMUNITY)
Admission: RE | Admit: 2013-06-19 | Discharge: 2013-06-19 | Disposition: A | Discharge status: Home / Self Care | Payer: Medicare HMO | Source: Ambulatory Visit | Attending: Internal Medicine | Admitting: Internal Medicine

## 2013-06-19 DIAGNOSIS — IMO0001 Reserved for inherently not codable concepts without codable children: Secondary | ICD-10-CM | POA: Diagnosis not present

## 2013-06-19 NOTE — Evaluation (Signed)
Occupational Therapy Discharge Patient Details  Name: Heather [name] MRN: [id number] Date of Birth: 15-Feb-1941  Today's Date: 06/19/2013 Time: 8413-2440 OT Time Calculation (min): 38 min Manual therapy 1027-2536 28' reasessment 6440-3474 10'  Visit#: 8 of 8  Re-eval:    Assessment Diagnosis: Left shoulder pain  Authorization: [employer] HMO - [facility] referring MD, no auth needed from [place] Time Period: prior to 10th visit  Authorization Visit#: 8 of 10   Past Medical History:  Past Medical History  Diagnosis Date  . HTN (hypertension)   . COPD (chronic obstructive pulmonary disease)   . GERD (gastroesophageal reflux disease)   . Glaucoma    Past Surgical History:  Past Surgical History  Procedure Laterality Date  . Joint replacement Bilateral     hip   . Joint replacement Right     knee  . Shoulder arthroscopy Right   . Abdominal hysterectomy    . Back surgery    . Foot surgery      Subjective  S:  I can do pretty much everything I want to do with my arm again, and it doesnt hurt to bad. Special Tests: FOTO score improved from 64% to 88%. Pain Assessment Currently in Pain?: No/denies Pain Score: 0-No pain    Additional Assessments LUE AROM (degrees) LUE Overall AROM Comments: assessed in seated ER/IR with shoulder abducted (05/26/13) Left Shoulder Flexion: 170 Degrees (110) Left Shoulder ABduction: 170 Degrees (105) Left Shoulder Internal Rotation: 60 Degrees (90 with shoulder adducted ) Left Shoulder External Rotation: 90 Degrees (55 with shoulder addcuted) LUE PROM (degrees) LUE Overall PROM Comments: PROM is WFL LUE Strength LUE Overall Strength Comments: assessed in seated ER/IR with shoulder abducted (05/26/13 wtih shoulder adducted for ER/IR) Left Shoulder Flexion: 4/5 (3-/5) Left Shoulder ABduction: 5/5 (3-/5) Left Shoulder Internal Rotation: 5/5 (3/5) Left Shoulder External Rotation: 5/5 (3/5)      Exercise/Treatments Standing Extension: Theraband;10 reps Theraband Level (Shoulder Extension): Level 2 (Red) Row: Theraband;10 reps Theraband Level (Shoulder Row): Level 2 (Red) Retraction: Theraband;10 reps Theraband Level (Shoulder Retraction): Level 2 (Red)    Manual Therapy Myofascial Release: MFR and manual stretching to left scapular region, SCM, trapezius region and cervical region with manual cervical traction.    Occupational Therapy Assessment and Plan OT Assessment and Plan Clinical Impression Statement: A:  Patient has vastly improved AROM and strength in her left shoulder.  Feels she is back to her prior level of independence and ready for dc at this time.  OT Plan: P:  DC from skilled OT intervention this date.     Goals Short Term Goals Short Term Goal 1: Patient will be educated on HEP.  Short Term Goal 1 Progress: Met Short Term Goal 2: Patient willl increase PROM of left shoulder to [facility] to increase ability to donn shirt with greater ease.  Short Term Goal 2 Progress: Met Short Term Goal 3: Patient will decrease pain level to 5/10 when completing daily activities.  Short Term Goal 3 Progress: Met Short Term Goal 4: Patient will increase left shoulder strength to 4+/5 to increase ability to raise arm up overhead into cabinets. Short Term Goal 4 Progress: Met Short Term Goal 5: Patient will decrease fascial restrictions to moderate. Short Term Goal 5 Progress: Met Long Term Goals Long Term Goal 1: Patient will return to highest level of indenpendence with all B/ADL and leisure activities.  Long Term Goal 1 Progress: Met Long Term Goal 2: Patient will increase AROM of  left shoulder to [facility] to increase ability to wash her back. Long Term Goal 2 Progress: Met Long Term Goal 3: Patient will decrease pain level to 2/10 or less when completing daily activities.  Long Term Goal 3 Progress: Met Long Term Goal 4: Patient will increase left shoulder strength to 4+/5 to  increase ability to do dishes and lift them into cabinets. Long Term Goal 4 Progress: Met Long Term Goal 5: Patient will decrease fascial restrictions to minimum.  Long Term Goal 5 Progress: Met  Problem List Patient Active Problem List   Diagnosis Date Noted  . Muscle weakness (generalized) 05/26/2013  . Pain in joint, shoulder region 05/26/2013  . Rotator cuff syndrome 05/22/2013    End of Session Activity Tolerance: Patient tolerated treatment well General Behavior During Therapy: WFL for tasks assessed/performed OT Plan of Care OT Home Exercise Plan: postural tband exercises with red tband OT Patient Instructions: handout not scanned Consulted and Agree with Plan of Care: Patient  GO Functional Assessment Tool Used: FOTO scred 88% independent  Functional Limitation: Carrying, moving and handling objects Carrying, Moving and Handling Objects Goal Status ([id number]): At least 1 percent but less than 20 percent impaired, limited or restricted Carrying, Moving and Handling Objects Discharge Status [phone]): At least 1 percent but less than 20 percent impaired, limited or restricted  Arbutus Ped, OTR/L 06/19/2013, 11:45 AM  Physician Documentation Your signature is required to indicate approval of the treatment plan as stated above.  Please sign and either send electronically or make a copy of this report for your files and return this physician signed original.  Please mark one 1.__approve of plan  2. ___approve of plan with the following conditions.   ______________________________                                                          _____________________ Physician Signature                                                                                                             Date

## 2013-07-03 ENCOUNTER — Ambulatory Visit (INDEPENDENT_AMBULATORY_CARE_PROVIDER_SITE_OTHER): Payer: Medicare HMO | Admitting: Orthopedic Surgery

## 2013-07-03 VITALS — BP 121/64 | Ht 63.0 in | Wt 190.0 lb

## 2013-07-03 DIAGNOSIS — M75100 Unspecified rotator cuff tear or rupture of unspecified shoulder, not specified as traumatic: Secondary | ICD-10-CM

## 2013-07-03 DIAGNOSIS — M719 Bursopathy, unspecified: Secondary | ICD-10-CM

## 2013-07-03 DIAGNOSIS — M67919 Unspecified disorder of synovium and tendon, unspecified shoulder: Secondary | ICD-10-CM

## 2013-07-03 NOTE — Progress Notes (Signed)
   Subjective:    Patient ID: Heather [name], female    DOB: Nov 09, 1940, 73 y.o.   MRN: [id number]  Chief Complaint  Patient presents with  . Follow-up    6 week recheck left shoulder rotator cuff syndrome s/p injection and therapy    HPI  73 years old atraumatic onset of left shoulder pain treated with injection and physical therapy presents for followup Improved but still having night pain   Review of Systems Normal     Objective:   Physical Exam  BP 121/64  Ht 5\' 3"  (1.6 m)  Wt 190 lb (86.183 kg)  BMI 33.67 kg/m2 General appearance is normal, the patient is alert and oriented x3 with normal [name] and affect. S spinatus 5/5 MMT Pain ful ROM  NV intact        Assessment & Plan:   Encounter Diagnosis  Name Primary?  . Rotator cuff syndrome Yes    Shoulder Injection Procedure Note   Pre-operative Diagnosis: right  RC Syndrome  Post-operative Diagnosis: same  Indications: pain   Anesthesia: ethyl chloride   Procedure Details   Verbal consent was obtained for the procedure. The shoulder was prepped withalcohol and the skin was anesthetized. A 20 gauge needle was advanced into the subacromial space through posterior approach without difficulty  The space was then injected with 3 ml 1% lidocaine and 1 ml of depomedrol. The injection site was cleansed with isopropyl alcohol and a dressing was applied.  Complications:  None; patient tolerated the procedure well.

## 2013-09-02 ENCOUNTER — Ambulatory Visit (INDEPENDENT_AMBULATORY_CARE_PROVIDER_SITE_OTHER): Payer: Medicare HMO | Admitting: Orthopedic Surgery

## 2013-09-02 VITALS — BP 143/58 | Ht 63.0 in | Wt 190.0 lb

## 2013-09-02 DIAGNOSIS — M75102 Unspecified rotator cuff tear or rupture of left shoulder, not specified as traumatic: Secondary | ICD-10-CM

## 2013-09-02 DIAGNOSIS — S43429A Sprain of unspecified rotator cuff capsule, initial encounter: Secondary | ICD-10-CM

## 2013-09-02 MED ORDER — ACETAMINOPHEN-CODEINE 300-30 MG PO TABS: 1.0000 | ORAL_TABLET | ORAL | Status: DC | PRN

## 2013-09-02 NOTE — Progress Notes (Signed)
Patient ID: Heather [name], female   DOB: 1940-08-12, 73 y.o.   MRN: [id number]  Chief Complaint  Patient presents with  . Follow-up    2 month recheck left shoulder    Third visit status post problems with her left shoulder. She's had injection twice and had therapy. She says her shoulder is worse more pain more weakness more crepitation  System review musculoskeletal otherwise normal   General the patient is well-developed and well-nourished grooming and hygiene are normal Oriented x3 Mood and affect normal Inspection of the left shoulder reveals severe crepitation anterior joint line pain and pain over the biceps tendon. Range of motion passively is normal but painful her shoulder is stable posteriorly and anteriorly has weakness of her supraspinatus tendon grade 4/5 This is a new finding, Skin clean dry and intact  Cardiovascular exam is normal Sensory exam normal   She probably has a rotator cuff tear. She'll need surgery of the tendon is torn. Recommend MRI to evaluate for surgical management

## 2013-09-02 NOTE — Patient Instructions (Signed)
MRI left shoulder

## 2013-09-09 ENCOUNTER — Ambulatory Visit (HOSPITAL_COMMUNITY)
Admission: RE | Admit: 2013-09-09 | Discharge: 2013-09-09 | Disposition: A | Discharge status: Home / Self Care | Payer: Medicare HMO | Source: Ambulatory Visit | Attending: Orthopedic Surgery | Admitting: Orthopedic Surgery

## 2013-09-09 DIAGNOSIS — M75102 Unspecified rotator cuff tear or rupture of left shoulder, not specified as traumatic: Secondary | ICD-10-CM

## 2013-09-09 DIAGNOSIS — M25519 Pain in unspecified shoulder: Secondary | ICD-10-CM | POA: Insufficient documentation

## 2013-09-09 DIAGNOSIS — S46819A Strain of other muscles, fascia and tendons at shoulder and upper arm level, unspecified arm, initial encounter: Secondary | ICD-10-CM | POA: Insufficient documentation

## 2013-09-09 DIAGNOSIS — M19019 Primary osteoarthritis, unspecified shoulder: Secondary | ICD-10-CM | POA: Insufficient documentation

## 2013-09-25 ENCOUNTER — Telehealth: Payer: Self-pay | Admitting: Orthopedic Surgery

## 2013-09-25 NOTE — Telephone Encounter (Signed)
Patient called to relay information as discussed with Dr Romeo Apple at last visit as to what type of injection she was given while in [place] approximately  one year ago.  States it was in her Right Shoulder, and it was Kenalog, and done by a primary care provider.  Patient is scheduled for her MRI result appointment 09/29/13.

## 2013-09-29 ENCOUNTER — Ambulatory Visit: Payer: Medicare HMO | Admitting: Orthopedic Surgery

## 2013-09-29 ENCOUNTER — Encounter: Payer: Self-pay | Admitting: Orthopedic Surgery

## 2013-09-30 ENCOUNTER — Telehealth: Payer: Self-pay | Admitting: Orthopedic Surgery

## 2013-09-30 DIAGNOSIS — M75102 Unspecified rotator cuff tear or rupture of left shoulder, not specified as traumatic: Secondary | ICD-10-CM

## 2013-09-30 MED ORDER — ACETAMINOPHEN-CODEINE 300-30 MG PO TABS: 1.0000 | ORAL_TABLET | ORAL | Status: DC | PRN

## 2013-09-30 NOTE — Telephone Encounter (Signed)
Patient has called back and re-scheduled her 09/29/13 missed appointment for MRI results; re-scheduled to next available, 10/07/13.  She is asking about pain medication refill for: Acetaminophen Codeine 300.  Please advise.  Patient ph# [phone]

## 2013-09-30 NOTE — Telephone Encounter (Signed)
Routing to Dr Harrison 

## 2013-09-30 NOTE — Telephone Encounter (Signed)
Prescription available for pick up, patient aware 

## 2013-09-30 NOTE — Telephone Encounter (Signed)
refill 

## 2013-10-01 NOTE — Telephone Encounter (Signed)
Prescription picked up by the patient °

## 2013-10-07 ENCOUNTER — Ambulatory Visit (INDEPENDENT_AMBULATORY_CARE_PROVIDER_SITE_OTHER): Payer: Medicare HMO | Admitting: Orthopedic Surgery

## 2013-10-07 ENCOUNTER — Encounter: Payer: Self-pay | Admitting: Orthopedic Surgery

## 2013-10-07 VITALS — BP 126/61 | Ht 63.0 in | Wt 190.0 lb

## 2013-10-07 DIAGNOSIS — M75102 Unspecified rotator cuff tear or rupture of left shoulder, not specified as traumatic: Secondary | ICD-10-CM

## 2013-10-07 DIAGNOSIS — S43429A Sprain of unspecified rotator cuff capsule, initial encounter: Secondary | ICD-10-CM

## 2013-10-07 MED ORDER — ACETAMINOPHEN-CODEINE 300-30 MG PO TABS: 1.0000 | ORAL_TABLET | ORAL | Status: DC | PRN

## 2013-10-07 NOTE — Patient Instructions (Signed)
Meds ordered this encounter  Medications  . Acetaminophen-Codeine 300-30 MG per tablet    Sig: Take 1 tablet by mouth every 4 (four) hours as needed for pain.    Dispense:  60 tablet    Refill:  1

## 2013-10-07 NOTE — Progress Notes (Signed)
Chief Complaint  Patient presents with  . Results    review MRI of Left shoulder    Rotator cuff disease left shoulder. 2 injections and home exercises as well as physical therapy still complains of pain.  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 She still has pain although the Tylenol with Codeine does help.   BP 126/61  Ht 5\' 3"  (1.6 m)  Wt 190 lb (86.183 kg)  BMI 33.67 kg/m2 General appearance is normal, the patient is alert and oriented x3 with normal mood and affect. She has good function of 120 of flexion she can get her hand on top of her head but she has pain Neurovascular exam intact   I discussed surgical versus nonsurgical options for her which included Tylenol 3 and further home exercises to strengthen the shoulder she's already had physical therapy. She opted for pain medication. We will see her in 3 months we refilled her Tylenol No. 3  Meds ordered this encounter  Medications  . Acetaminophen-Codeine 300-30 MG per tablet    Sig: Take 1 tablet by mouth every 4 (four) hours as needed for pain.    Dispense:  60 tablet    Refill:  1

## 2013-10-20 ENCOUNTER — Telehealth: Payer: Self-pay | Admitting: Orthopedic Surgery

## 2013-10-20 NOTE — Telephone Encounter (Signed)
Heather [name] said since 10/13/13 she has had a lot of swelling in both ankles, feet and legs.  She thinks it is a reaction from Aceaminophen-Codeine 300-30. Has also had some itching.  She says the swelling does not go down any during the night.  She has bee elevating and has been wearing her hose but has not helped.  Said she is still taking it because her pain is so bad. Told her to check with her PCP as Heather [name] not in the office today and her PCP needs to be aware of the swelling.

## 2013-10-27 ENCOUNTER — Emergency Department (HOSPITAL_COMMUNITY)
Admission: EM | Admit: 2013-10-27 | Discharge: 2013-10-27 | Disposition: A | Discharge status: Home / Self Care | Payer: Medicare HMO | Attending: Emergency Medicine | Admitting: Emergency Medicine

## 2013-10-27 ENCOUNTER — Encounter (HOSPITAL_COMMUNITY): Payer: Self-pay | Admitting: Emergency Medicine

## 2013-10-27 DIAGNOSIS — R220 Localized swelling, mass and lump, head: Secondary | ICD-10-CM | POA: Insufficient documentation

## 2013-10-27 DIAGNOSIS — K219 Gastro-esophageal reflux disease without esophagitis: Secondary | ICD-10-CM | POA: Diagnosis not present

## 2013-10-27 DIAGNOSIS — T44905A Adverse effect of unspecified drugs primarily affecting the autonomic nervous system, initial encounter: Secondary | ICD-10-CM | POA: Diagnosis not present

## 2013-10-27 DIAGNOSIS — Z79899 Other long term (current) drug therapy: Secondary | ICD-10-CM | POA: Insufficient documentation

## 2013-10-27 DIAGNOSIS — R1310 Dysphagia, unspecified: Secondary | ICD-10-CM | POA: Insufficient documentation

## 2013-10-27 DIAGNOSIS — IMO0002 Reserved for concepts with insufficient information to code with codable children: Secondary | ICD-10-CM | POA: Diagnosis not present

## 2013-10-27 DIAGNOSIS — J4489 Other specified chronic obstructive pulmonary disease: Secondary | ICD-10-CM | POA: Insufficient documentation

## 2013-10-27 DIAGNOSIS — J449 Chronic obstructive pulmonary disease, unspecified: Secondary | ICD-10-CM | POA: Diagnosis not present

## 2013-10-27 DIAGNOSIS — R221 Localized swelling, mass and lump, neck: Secondary | ICD-10-CM

## 2013-10-27 DIAGNOSIS — I10 Essential (primary) hypertension: Secondary | ICD-10-CM | POA: Diagnosis not present

## 2013-10-27 DIAGNOSIS — T7840XA Allergy, unspecified, initial encounter: Secondary | ICD-10-CM

## 2013-10-27 DIAGNOSIS — Z7982 Long term (current) use of aspirin: Secondary | ICD-10-CM | POA: Diagnosis not present

## 2013-10-27 DIAGNOSIS — Z87891 Personal history of nicotine dependence: Secondary | ICD-10-CM | POA: Diagnosis not present

## 2013-10-27 DIAGNOSIS — H409 Unspecified glaucoma: Secondary | ICD-10-CM | POA: Diagnosis not present

## 2013-10-27 MED ORDER — METHYLPREDNISOLONE SODIUM SUCC 125 MG IJ SOLR
125.0000 mg | Freq: Once | INTRAMUSCULAR | Status: AC
  Administered 2013-10-27: 125 mg via INTRAVENOUS
  Filled 2013-10-27: qty 2

## 2013-10-27 MED ORDER — DIPHENHYDRAMINE HCL 50 MG/ML IJ SOLN
25.0000 mg | Freq: Once | INTRAMUSCULAR | Status: AC
  Administered 2013-10-27: 25 mg via INTRAVENOUS
  Filled 2013-10-27: qty 1

## 2013-10-27 MED ORDER — FAMOTIDINE IN NACL 20-0.9 MG/50ML-% IV SOLN
20.0000 mg | Freq: Once | INTRAVENOUS | Status: AC
  Administered 2013-10-27: 20 mg via INTRAVENOUS
  Filled 2013-10-27: qty 50

## 2013-10-27 MED ORDER — PREDNISONE 10 MG PO TABS: 20.0000 mg | ORAL_TABLET | Freq: Every day | ORAL | Status: DC

## 2013-10-27 NOTE — ED Notes (Signed)
Pt states started new HTN med, feels like tongue swelling, pt able to speak clearly and in no distress at present, started this morning, took med at 0730

## 2013-10-27 NOTE — ED Provider Notes (Signed)
CSN: [id number]     Arrival date & time 10/27/13  1541 History   First MD Initiated Contact with Patient 10/27/13 1750     Chief Complaint  Patient presents with  . Allergic Reaction     (Consider location/radiation/quality/duration/timing/severity/associated sxs/prior Treatment) Patient is a 73 y.o. female presenting with allergic reaction. The history is provided by the patient (the pt complains of swelling to tongue).  Allergic Reaction Presenting symptoms: difficulty swallowing   Presenting symptoms: no difficulty breathing and no rash   Difficulty swallowing:    Severity:  Mild   Onset quality:  Gradual   Timing:  Constant   Progression:  Unchanged Severity:  Mild   Past Medical History  Diagnosis Date  . HTN (hypertension)   . COPD (chronic obstructive pulmonary disease)   . GERD (gastroesophageal reflux disease)   . Glaucoma    Past Surgical History  Procedure Laterality Date  . Joint replacement Bilateral     hip   . Joint replacement Right     knee  . Shoulder arthroscopy Right   . Abdominal hysterectomy    . Back surgery    . Foot surgery     History reviewed. No pertinent family history. History  Substance Use Topics  . Smoking status: Former Games developer  . Smokeless tobacco: Not on file  . Alcohol Use: No   OB History   Grav Para Term Preterm Abortions TAB SAB Ect Mult Living                 Review of Systems  Constitutional: Negative for appetite change and fatigue.  HENT: Positive for trouble swallowing. Negative for congestion, ear discharge and sinus pressure.        Tongue swelling  Eyes: Negative for discharge.  Respiratory: Negative for cough.   Cardiovascular: Negative for chest pain.  Gastrointestinal: Negative for abdominal pain and diarrhea.  Genitourinary: Negative for frequency and hematuria.  Musculoskeletal: Negative for back pain.  Skin: Negative for rash.  Neurological: Negative for seizures and headaches.   Psychiatric/Behavioral: Negative for hallucinations.      Allergies  Review of patient's allergies indicates no known allergies.  Home Medications   Prior to Admission medications   Medication Sig Start Date End Date Taking? Authorizing Provider  acetaminophen-codeine (TYLENOL #3) 300-30 MG per tablet Take by mouth every 4 (four) hours as needed for moderate pain.   Yes Historical Provider, MD  Acetaminophen-Codeine 300-30 MG per tablet Take 1 tablet by mouth every 4 (four) hours as needed for pain. 10/07/13  Yes Vickki Hearing, MD  aspirin EC 81 MG tablet Take 81 mg by mouth every morning.   Yes Historical Provider, MD  Calcium-Magnesium-Zinc [phone] MG TABS Take 1 capsule by mouth daily.   Yes Historical Provider, MD  cetirizine (ZYRTEC) 10 MG tablet Take 10 mg by mouth every morning.   Yes Historical Provider, MD  cholecalciferol (VITAMIN D) 1000 UNITS tablet Take 1,000 Units by mouth at bedtime.   Yes Historical Provider, MD  citalopram (CELEXA) 20 MG tablet Take 20 mg by mouth every morning.   Yes Historical Provider, MD  cloNIDine (CATAPRES) 0.2 MG tablet Take 0.2 mg by mouth 2 (two) times daily.   Yes Historical Provider, MD  Fluticasone-Salmeterol (ADVAIR) 250-50 MCG/DOSE AEPB Inhale 1 puff into the lungs 2 (two) times daily.   Yes Historical Provider, MD  furosemide (LASIX) 40 MG tablet Take 40 mg by mouth daily.   Yes Historical Provider, MD  losartan (COZAAR)  100 MG tablet Take 100 mg by mouth every morning.   Yes Historical Provider, MD  metoprolol (LOPRESSOR) 50 MG tablet Take 50 mg by mouth 2 (two) times daily.   Yes Historical Provider, MD  Multiple Vitamins-Minerals (SENTRY) TABS Take 1 tablet by mouth every morning.   Yes Historical Provider, MD  nitroGLYCERIN (NITROSTAT) 0.4 MG SL tablet Place 0.4 mg under the tongue every 5 (five) minutes as needed for chest pain.   Yes Historical Provider, MD  Omega-3 Fatty Acids (FISH OIL) 1000 MG CAPS Take 1 capsule by mouth at  bedtime.   Yes Historical Provider, MD  omeprazole (PRILOSEC) 20 MG capsule Take 20 mg by mouth daily.   Yes Historical Provider, MD  zolpidem (AMBIEN) 10 MG tablet Take 10 mg by mouth at bedtime as needed for sleep.   Yes Historical Provider, MD  predniSONE (DELTASONE) 10 MG tablet Take 2 tablets (20 mg total) by mouth daily. 10/27/13   Benny Lennert, MD   BP 159/88  Pulse 60  Temp(Src) 98.1 F (36.7 C) (Oral)  Resp 18  Ht  (1.6 m)  Wt 195 lb (88.451 kg)  BMI 34.55 kg/m2  SpO2 98% Physical Exam  Constitutional: She is oriented to person, place, and time. She appears well-developed.  HENT:  Head: Normocephalic.  Mild swelling to tongue  Eyes: Conjunctivae and EOM are normal. No scleral icterus.  Neck: Neck supple. No thyromegaly present.  Cardiovascular: Normal rate and regular rhythm.  Exam reveals no gallop and no friction rub.   No murmur heard. Pulmonary/Chest: No stridor. She has no wheezes. She has no rales. She exhibits no tenderness.  Abdominal: She exhibits no distension. There is no tenderness. There is no rebound.  Musculoskeletal: Normal range of motion. She exhibits no edema.  Lymphadenopathy:    She has no cervical adenopathy.  Neurological: She is oriented to person, place, and time. She exhibits normal muscle tone. Coordination normal.  Skin: No rash noted. No erythema.  Psychiatric: She has a normal mood and affect. Her behavior is normal.    ED Course  Procedures (including critical care time) Labs Review Labs Reviewed - No data to display  Imaging Review No results found.   EKG Interpretation None      MDM   Final diagnoses:  Allergic reaction, initial encounter    Pt improving.  Will stop losartan and tx with prednisone and benadryl with follow up with pcp    Benny Lennert, MD 10/27/13 2022

## 2013-10-27 NOTE — Discharge Instructions (Signed)
Stop your bp medicine called losartan or cozaar.  Take benadryl for swelling or itching and follow up with your md this week.

## 2013-11-04 ENCOUNTER — Telehealth: Payer: Self-pay | Admitting: Orthopedic Surgery

## 2013-11-04 NOTE — Telephone Encounter (Signed)
Routing to dr harrison 

## 2013-11-04 NOTE — Telephone Encounter (Signed)
Patient called about her left shoulder, said that pain is on-going.  Her next scheduled appointment is 01/07/14. She said she had been already told by Dr Romeo Apple at her 10/07/13 visit that she would be needing surgery.  She also has some new changes regarding her [facility], which she is further checking on.  Please advise if a "sooner" appointment is needed or if there are other recommendations for her shoulder pain.  Her ph#'s are: [phone] [facility]) / [phone] [facility])

## 2013-11-05 NOTE — Telephone Encounter (Signed)
Visit after insurance cleared

## 2013-11-06 NOTE — Telephone Encounter (Addendum)
Called back to patient, left message, however, answer machine may not be working correctly. (Follow up) * I called back today on cell phone#, and reached.  Patient still needs to further check if referral is needed.

## 2013-11-06 NOTE — Telephone Encounter (Signed)
Please schedule appointment once insurance is verified

## 2013-11-13 NOTE — Telephone Encounter (Signed)
No further notes from Dr. Romeo Apple

## 2013-11-19 ENCOUNTER — Other Ambulatory Visit: Payer: Self-pay | Admitting: *Deleted

## 2013-11-19 ENCOUNTER — Telehealth: Payer: Self-pay | Admitting: Orthopedic Surgery

## 2013-11-19 DIAGNOSIS — M75102 Unspecified rotator cuff tear or rupture of left shoulder, not specified as traumatic: Secondary | ICD-10-CM

## 2013-11-19 MED ORDER — ACETAMINOPHEN-CODEINE 300-30 MG PO TABS: 1.0000 | ORAL_TABLET | ORAL | Status: DC | PRN

## 2013-11-20 NOTE — Telephone Encounter (Signed)
Prescription available, patient aware  

## 2013-12-04 ENCOUNTER — Other Ambulatory Visit: Payer: Self-pay | Admitting: *Deleted

## 2013-12-04 ENCOUNTER — Telehealth: Payer: Self-pay | Admitting: Orthopedic Surgery

## 2013-12-04 DIAGNOSIS — M75102 Unspecified rotator cuff tear or rupture of left shoulder, not specified as traumatic: Secondary | ICD-10-CM

## 2013-12-04 MED ORDER — ACETAMINOPHEN-CODEINE 300-30 MG PO TABS: 1.0000 | ORAL_TABLET | ORAL | Status: DC | PRN

## 2013-12-04 NOTE — Telephone Encounter (Signed)
Patient is calling asking for a refill on her pain meds acetaminophen-codeine (TYLENOL #3) 300-30 MG per tablet, please advise

## 2013-12-04 NOTE — Telephone Encounter (Signed)
Prescription available, patient aware  

## 2013-12-04 NOTE — Telephone Encounter (Signed)
Patient picked up Rx

## 2013-12-12 ENCOUNTER — Other Ambulatory Visit (HOSPITAL_COMMUNITY): Payer: Self-pay | Admitting: Internal Medicine

## 2013-12-12 DIAGNOSIS — M810 Age-related osteoporosis without current pathological fracture: Secondary | ICD-10-CM

## 2013-12-19 ENCOUNTER — Other Ambulatory Visit (HOSPITAL_COMMUNITY): Payer: Self-pay | Admitting: Internal Medicine

## 2013-12-19 ENCOUNTER — Ambulatory Visit (HOSPITAL_COMMUNITY)
Admission: RE | Admit: 2013-12-19 | Discharge: 2013-12-19 | Disposition: A | Discharge status: Home / Self Care | Payer: Medicare HMO | Source: Ambulatory Visit | Attending: Internal Medicine | Admitting: Internal Medicine

## 2013-12-19 DIAGNOSIS — M810 Age-related osteoporosis without current pathological fracture: Secondary | ICD-10-CM

## 2013-12-22 ENCOUNTER — Other Ambulatory Visit: Payer: Self-pay | Admitting: *Deleted

## 2013-12-22 ENCOUNTER — Telehealth: Payer: Self-pay | Admitting: Orthopedic Surgery

## 2013-12-22 DIAGNOSIS — M75102 Unspecified rotator cuff tear or rupture of left shoulder, not specified as traumatic: Secondary | ICD-10-CM

## 2013-12-22 MED ORDER — ACETAMINOPHEN-CODEINE 300-30 MG PO TABS: 1.0000 | ORAL_TABLET | ORAL | Status: DC | PRN

## 2013-12-22 NOTE — Telephone Encounter (Signed)
Patient is calling asking for a refill on pain medication acetaminophen-codeine (TYLENOL #3) 300-30 MG per please advise?

## 2013-12-23 NOTE — Telephone Encounter (Signed)
Prescription available for pick up, called patient, no answer 

## 2014-01-05 ENCOUNTER — Telehealth: Payer: Self-pay | Admitting: Orthopedic Surgery

## 2014-01-05 ENCOUNTER — Other Ambulatory Visit: Payer: Self-pay | Admitting: *Deleted

## 2014-01-05 MED ORDER — ACETAMINOPHEN-CODEINE #3 300-30 MG PO TABS: 1.0000 | ORAL_TABLET | ORAL | Status: DC | PRN

## 2014-01-05 NOTE — Telephone Encounter (Signed)
Mrs. Callanan is calling asking for a refill on her pain medication acetaminophen-codeine (TYLENOL #3) 300-30 MG per tablet [[id number]] please advise?

## 2014-01-08 ENCOUNTER — Encounter: Payer: Self-pay | Admitting: Orthopedic Surgery

## 2014-01-08 ENCOUNTER — Ambulatory Visit (INDEPENDENT_AMBULATORY_CARE_PROVIDER_SITE_OTHER): Payer: Medicare HMO | Admitting: Orthopedic Surgery

## 2014-01-08 VITALS — BP 121/62 | Ht 63.0 in | Wt 195.0 lb

## 2014-01-08 DIAGNOSIS — M75102 Unspecified rotator cuff tear or rupture of left shoulder, not specified as traumatic: Secondary | ICD-10-CM

## 2014-01-08 NOTE — Progress Notes (Signed)
Patient ID: Heather [name], female   DOB: [birth year], 73 y.o.   MRN: [id number] Chief Complaint  Patient presents with  . 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 She is on Tylenol with codeine for pain still having pain and loss of function.  Review of systems negative  Fortunately she has 120 of flexion and can get her hand on top of her head neurovascular exam is intact her appearance is normal she is well-groomed she is alert and oriented 3 mood and affect are normal vital signs are stable blood pressure 121/62 weight 195 pounds and height 5 foot 3 inches  She has 3 new medications hydralazine 25 mg 2 daily potassium 10 mg 2 daily metoprolol 100 mg once a day  Shoulder arthritis with cuff tear arthritis  Recommend cuff tear CTA type prosthesis  She wants to have surgery in January

## 2014-01-19 ENCOUNTER — Other Ambulatory Visit: Payer: Self-pay | Admitting: *Deleted

## 2014-01-19 ENCOUNTER — Telehealth: Payer: Self-pay | Admitting: Orthopedic Surgery

## 2014-01-19 MED ORDER — ACETAMINOPHEN-CODEINE #3 300-30 MG PO TABS: 1.0000 | ORAL_TABLET | ORAL | Status: DC | PRN

## 2014-01-19 NOTE — Telephone Encounter (Signed)
Patient called to request medication refill: acetaminophen-codeine (TYLENOL #3) 300-30 MG per tablet [[id number]]   - her next scheduled appointment is 02/19/14.  Ph# is [phone]

## 2014-01-19 NOTE — Telephone Encounter (Signed)
Prescription faxed to pharmacy.

## 2014-01-28 ENCOUNTER — Other Ambulatory Visit (HOSPITAL_COMMUNITY): Payer: Self-pay | Admitting: Internal Medicine

## 2014-01-28 DIAGNOSIS — Z1231 Encounter for screening mammogram for malignant neoplasm of breast: Secondary | ICD-10-CM

## 2014-02-02 ENCOUNTER — Telehealth: Payer: Self-pay | Admitting: Orthopedic Surgery

## 2014-02-02 ENCOUNTER — Other Ambulatory Visit: Payer: Self-pay | Admitting: *Deleted

## 2014-02-02 MED ORDER — ACETAMINOPHEN-CODEINE #3 300-30 MG PO TABS: 1.0000 | ORAL_TABLET | ORAL | Status: DC | PRN

## 2014-02-02 NOTE — Telephone Encounter (Signed)
Patient is calling asking for a refill on pain medication acetaminophen-codeine (TYLENOL #3) 300-30 MG per tablet please advise?

## 2014-02-03 NOTE — Telephone Encounter (Signed)
Rx is ready for pick up, patient is aware

## 2014-02-16 ENCOUNTER — Telehealth: Payer: Self-pay | Admitting: Orthopedic Surgery

## 2014-02-16 ENCOUNTER — Other Ambulatory Visit: Payer: Self-pay | Admitting: *Deleted

## 2014-02-16 MED ORDER — ACETAMINOPHEN-CODEINE #3 300-30 MG PO TABS: 1.0000 | ORAL_TABLET | ORAL | Status: DC | PRN

## 2014-02-16 NOTE — Telephone Encounter (Signed)
PATIENT IS ASKING FOR A REFILL ON PAIN MEDICATION acetaminophen-codeine (TYLENOL #3) 300-30 MG per tablet AND IF IT CAN BE FAXED TO PHARMACY, PLEASE ADVISE?

## 2014-02-16 NOTE — Telephone Encounter (Signed)
Script faxed to [facility], patient aware

## 2014-02-19 ENCOUNTER — Encounter: Payer: Self-pay | Admitting: Orthopedic Surgery

## 2014-02-19 ENCOUNTER — Ambulatory Visit (INDEPENDENT_AMBULATORY_CARE_PROVIDER_SITE_OTHER): Payer: Commercial Managed Care - HMO | Admitting: Orthopedic Surgery

## 2014-02-19 VITALS — BP 129/67 | Ht 63.0 in | Wt 195.0 lb

## 2014-02-19 DIAGNOSIS — M75102 Unspecified rotator cuff tear or rupture of left shoulder, not specified as traumatic: Secondary | ICD-10-CM

## 2014-02-19 NOTE — Patient Instructions (Signed)
SURGERY LEFT SHOULDER ARTHROPLASTY 03/25/14

## 2014-02-19 NOTE — Progress Notes (Signed)
Patient ID: Heather [name], female   DOB: 05/23/1940, 73 y.o.   MRN: [id number]  Patient ID: Heather [name], female   DOB: 02/08/1941, 73 y.o.   MRN: [id number]  Chief Complaint  Patient presents with  . Follow-up    6 week recheck on left shoulder.    HPI Heather [name] is a 73 y.o. female.  73 year old female presents for reevaluation of her left shoulder and possible preop for left shoulder prosthesis. In review the patient is 73 years old she presented back in March 2015 with painful left shoulder for 2 months associated with loss of motion. She was having 10 out of 10 pain at that time and loss of pain-free for elevation. She was treated with physical therapy and subacromial injection and eventually had an MRI of her shoulder area the MRI showed a chronic rotator cuff tear with a high riding humeral head and glenohumeral arthritis. She did not want have surgery was treated with codeine. She presents back now for preop and wants to have her surgery in January   HPI  Review of Systems Review of Systems Currently normal  Past Medical History  Diagnosis Date  . HTN (hypertension)   . COPD (chronic obstructive pulmonary disease)   . GERD (gastroesophageal reflux disease)   . Glaucoma     Past Surgical History  Procedure Laterality Date  . Joint replacement Bilateral     hip   . Joint replacement Right     knee  . Shoulder arthroscopy Right   . Abdominal hysterectomy    . Back surgery    . Foot surgery      History reviewed. No pertinent family history.  Social History History  Substance Use Topics  . Smoking status: Former Games developermoker  . Smokeless tobacco: Not on file  . Alcohol Use: No    No Known Allergies  Current Outpatient Prescriptions  Medication Sig Dispense Refill  . acetaminophen-codeine (TYLENOL #3) 300-30 MG per tablet Take 1 tablet by mouth every 4 (four) hours as needed for moderate pain. 60 tablet 0  . Acetaminophen-Codeine 300-30 MG per tablet Take 1  tablet by mouth every 4 (four) hours as needed for pain. 60 tablet 0  . aspirin EC 81 MG tablet Take 81 mg by mouth every morning.    . Calcium-Magnesium-Zinc 333-133-5 MG TABS Take 1 capsule by mouth daily.    . cetirizine (ZYRTEC) 10 MG tablet Take 10 mg by mouth every morning.    . cholecalciferol (VITAMIN D) 1000 UNITS tablet Take 1,000 Units by mouth at bedtime.    . citalopram (CELEXA) 20 MG tablet Take 20 mg by mouth every morning.    . cloNIDine (CATAPRES) 0.2 MG tablet Take 0.2 mg by mouth 2 (two) times daily.    . Fluticasone-Salmeterol (ADVAIR) 250-50 MCG/DOSE AEPB Inhale 1 puff into the lungs 2 (two) times daily.    . furosemide (LASIX) 40 MG tablet Take 40 mg by mouth daily.    [name]. losartan (COZAAR) 100 MG tablet Take 100 mg by mouth every morning.    . metoprolol (LOPRESSOR) 50 MG tablet Take 50 mg by mouth 2 (two) times daily.    . Multiple Vitamins-Minerals (SENTRY) TABS Take 1 tablet by mouth every morning.    . nitroGLYCERIN (NITROSTAT) 0.4 MG SL tablet Place 0.4 mg under the tongue every 5 (five) minutes as needed for chest pain.    . Omega-3 Fatty Acids (FISH OIL) 1000 MG CAPS Take 1 capsule by  mouth at bedtime.    [name] omeprazole (PRILOSEC) 20 MG capsule Take 20 mg by mouth daily.    . predniSONE (DELTASONE) 10 MG tablet Take 2 tablets (20 mg total) by mouth daily. 6 tablet 0  . zolpidem (AMBIEN) 10 MG tablet Take 10 mg by mouth at bedtime as needed for sleep.     No current facility-administered medications for this visit.       Physical Exam Blood pressure 129/67, height 5\' 3"  (1.6 m), weight 195 lb (88.451 kg). Physical Exam Well-developed well-nourished female grooming hygiene intact oriented 3 mood and affect normal gait unremarkable  Lower extremities normal  Right shoulder no abnormalities. Right upper extremity normal.  She is maintained 120 for elevation and can get her hand on top of her head she has upper spinatus tendon weakness. No instability detected.  Joint line is mildly tender. 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

## 2014-02-23 ENCOUNTER — Other Ambulatory Visit: Payer: Self-pay | Admitting: *Deleted

## 2014-02-25 ENCOUNTER — Other Ambulatory Visit: Payer: Self-pay | Admitting: *Deleted

## 2014-02-25 MED ORDER — ACETAMINOPHEN-CODEINE #3 300-30 MG PO TABS: 1.0000 | ORAL_TABLET | ORAL | Status: DC | PRN

## 2014-03-02 ENCOUNTER — Ambulatory Visit (HOSPITAL_COMMUNITY)
Admission: RE | Admit: 2014-03-02 | Discharge: 2014-03-02 | Disposition: A | Discharge status: Home / Self Care | Payer: Medicare HMO | Source: Ambulatory Visit | Attending: Internal Medicine | Admitting: Internal Medicine

## 2014-03-02 DIAGNOSIS — Z1231 Encounter for screening mammogram for malignant neoplasm of breast: Secondary | ICD-10-CM | POA: Insufficient documentation

## 2014-03-04 ENCOUNTER — Telehealth: Payer: Self-pay | Admitting: Orthopedic Surgery

## 2014-03-04 NOTE — Telephone Encounter (Signed)
03/03/14, approximately 5:29pm, entered pre-authorization on [facility] [employer] insurance portal, for surgery (in-patient/admit) 03/25/14, [facility], CPT [phone], ICD10 M75.102.  03/04/14 (today) 8:53am, received Authorization [id number], effective 03/25/14, for 1 day; if additional day(s) needed, hospital to contact [facility] with clinical information.

## 2014-03-16 ENCOUNTER — Other Ambulatory Visit: Payer: Self-pay | Admitting: *Deleted

## 2014-03-16 ENCOUNTER — Telehealth: Payer: Self-pay | Admitting: Orthopedic Surgery

## 2014-03-16 DIAGNOSIS — M75102 Unspecified rotator cuff tear or rupture of left shoulder, not specified as traumatic: Secondary | ICD-10-CM

## 2014-03-16 MED ORDER — ACETAMINOPHEN-CODEINE 300-30 MG PO TABS: 1.0000 | ORAL_TABLET | ORAL | Status: DC | PRN

## 2014-03-16 NOTE — Telephone Encounter (Signed)
Patient called, requests refill of medication: acetaminophen-codeine (TYLENOL #3) 300-30 MG per tablet [[id number]]  Phone # [phone]

## 2014-03-17 NOTE — Telephone Encounter (Signed)
Prescription faxed to pharmacy.

## 2014-03-19 NOTE — Patient Instructions (Signed)
Heather [name]  03/19/2014   Your procedure is scheduled on:  03/25/2014  Report to [facility] at  850  AM.  Call this number if you have problems the morning of surgery: [phone]   Remember:   Do not eat food or drink liquids after midnight.   Take these medicines the morning of surgery with A SIP OF WATER:  Zyrtec, celexa, clonodine, metoprolol, prilosec, prednisone   Do not wear jewelry, make-up or nail polish.  Do not wear lotions, powders, or perfumes.   Do not shave 48 hours prior to surgery. Men may shave face and neck.  Do not bring valuables to the hospital.  [facility] is not responsible for any belongings or valuables.               Contacts, dentures or bridgework may not be worn into surgery.  Leave suitcase in the car. After surgery it may be brought to your room.  For patients admitted to the hospital, discharge time is determined by your treatment team.               Patients discharged the day of surgery will not be allowed to drive home.  Name and phone number of your driver: family  Special Instructions: Shower using CHG 2 nights before surgery and the night before surgery.  If you shower the day of surgery use CHG.  Use special wash - you have one bottle of CHG for all showers.  You should use approximately 1/3 of the bottle for each shower.   Please read over the following fact sheets that you were given: Pain Booklet, Coughing and Deep Breathing, Blood Transfusion Information, Total Joint Packet, MRSA Information, Surgical Site Infection Prevention, Anesthesia Post-op Instructions and Care and Recovery After Surgery Shoulder Joint Replacement Shoulder replacement surgery replaces damaged surfaces with artificial parts (prostheses). Usually, there are two parts used to replace this joint:  The humeral component replaces the head of the upper arm bone. It is made of metal (usually cobalt-chromium-based alloys). This is a rounded ball attached to a stem that  fits into the humerus bone. This part comes in various sizes and can be a single piece or a modular unit.  The glenoid component replaces the socket (glenoid depression). It is made of ultra high-density polyethylene. Some versions have a metal tray, but totally plastic versions are more common. Depending on the damage to your shoulder, the surgeon may replace just the humeral head (hemiarthroplasty) or both the humeral head and the glenoid (total shoulder replacement). The shoulder parts come in various sizes and shapes to fit different people. They are held in place with either bone cement (cemented) or bone ingrowth (cementless).  The surrounding muscles and tendons hold the prosthetic parts in place, the same as the original shoulder. Each case is individual, and your surgeon will evaluate your situation carefully before making any decisions. Ask what type of implant will be used and why that choice is appropriate for you. LET [facility] CARE PROVIDER KNOW ABOUT:  Any allergies you have.  All medicines you are taking, including vitamins, herbs, eye drops, creams, and over-the-counter medicines.  Previous problems you or members of your family have had with the use of anesthetics.  Any blood disorders you have.  Previous surgeries you have had.  Medical conditions you have. RISKS AND COMPLICATIONS  Generally, shoulder joint replacement is a safe procedure. However, as with any procedure, complications can occur. Possible complications  include:  Infection.  Upper arm bone fracture that occurs during surgery (intraoperative fracture) or postoperative fractures.  Postoperative instability.  Loosening of the glenoid component over time. BEFORE THE PROCEDURE  Do not eat or drink after midnight the day before your procedure, or as instructed.   Ask your health care provider about changing or stopping any regular medicines. You may need to stop taking certain medicines up to 1 week  before the procedure.   You may have lab tests the morning of surgery.   Make arrangements for someone to assist you at home for the first week after discharge from the hospital. PROCEDURE   You will be given a medicine that numbs the shoulder area (regional anesthetic) or a medicine that makes you go to sleep (general anesthetic).  The surgical cut (incision) is 4-6 inches (10-15 cm) and is made on the front of the shoulder from the collarbone (clavicle) to the point where the shoulder muscle (deltoid) attaches to the upper arm bone. The surgeon will take care not to injure the nerves or blood vessels that cross the shoulder.  The upper arm bone is dislocated from the socket to expose the ball-like end of the upper arm. Only the portion of the bone covered by cartilage is removed. Articular cartilage covers the ends of bones where they meet the ends of other bones.  The center cavity of the humerus bone is cleaned and enlarged with reamers to create a hollow area that matches the shape of the implant stem. The top end of the bone is smoothed so the stem can be inserted flush with the bone surface.  If the ball of the prosthesis is a separate piece, the proper size is selected and attached.  If the socket portion of the joint is basically healthy and the surrounding muscles are intact, the surgeon may decide not to replace it. However, if the socket is arthritic, the upper arm bone is moved to the back, and the surgeon will implant the glenoid component. The surgeon prepares the socket surface by removing the remaining damaged cartilage. The socket bone is then gently reamed to match the implant. Protrusions on the artificial socket part are then fitted into holes drilled in the bone surface. Once the part fits, it is cemented into position.  The arm bone, with its new artificial head, is replaced in the socket. The surgeon reattaches the supporting tendons and closes the incision.  The arm is  placed in a sling, and a support pillow is placed under the elbow to protect the repair.  Tubes are placed to remove excess drainage. These are usually removed a day or two later. AFTER THE PROCEDURE  You will be taken to a recovery area and monitored until the anesthetic wears off.  Your arm will be numb from the regional anesthetic. This may last until the following day.  Pain medicines will be given to control the pain.  Typically, you will remain in the hospital for 2-3 days.  Your arm and shoulder will be stiff and bruised. This will improve over time.  An icing device is placed around your shoulder. This helps to control pain and swelling.  Your arm will be in a sling. You will need to wear this for 4-6 weeks after surgery.  The day after surgery, your health care team will begin to show you exercises for your shoulder. Document Released: 11/19/2002 Document Revised: 07/07/2013 Document Reviewed: 09/19/2012 [facility] Patient Information 2015 [place], [place]. This information is  not intended to replace advice given to you by your health care provider. Make sure you discuss any questions you have with your health care provider. PATIENT INSTRUCTIONS POST-ANESTHESIA  IMMEDIATELY FOLLOWING SURGERY:  Do not drive or operate machinery for the first twenty four hours after surgery.  Do not make any important decisions for twenty four hours after surgery or while taking narcotic pain medications or sedatives.  If you develop intractable nausea and vomiting or a severe headache please notify your doctor immediately.  FOLLOW-UP:  Please make an appointment with your surgeon as instructed. You do not need to follow up with anesthesia unless specifically instructed to do so.  WOUND CARE INSTRUCTIONS (if applicable):  Keep a dry clean dressing on the anesthesia/puncture wound site if there is drainage.  Once the wound has quit draining you may leave it open to air.  Generally you should leave the  bandage intact for twenty four hours unless there is drainage.  If the epidural site drains for more than 36-48 hours please call the anesthesia department.  QUESTIONS?:  Please feel free to call your physician or the hospital operator if you have any questions, and they will be happy to assist you.

## 2014-03-20 ENCOUNTER — Encounter (HOSPITAL_COMMUNITY)
Admission: RE | Admit: 2014-03-20 | Discharge: 2014-03-20 | Disposition: A | Discharge status: Home / Self Care | Payer: Commercial Managed Care - HMO | Source: Ambulatory Visit | Attending: Orthopedic Surgery | Admitting: Orthopedic Surgery

## 2014-03-20 ENCOUNTER — Encounter (HOSPITAL_COMMUNITY): Payer: Self-pay

## 2014-03-20 ENCOUNTER — Other Ambulatory Visit: Payer: Self-pay

## 2014-03-20 DIAGNOSIS — Z01812 Encounter for preprocedural laboratory examination: Secondary | ICD-10-CM | POA: Insufficient documentation

## 2014-03-20 DIAGNOSIS — Z01818 Encounter for other preprocedural examination: Secondary | ICD-10-CM | POA: Diagnosis not present

## 2014-03-20 DIAGNOSIS — Z01810 Encounter for preprocedural cardiovascular examination: Secondary | ICD-10-CM | POA: Insufficient documentation

## 2014-03-20 DIAGNOSIS — M75102 Unspecified rotator cuff tear or rupture of left shoulder, not specified as traumatic: Secondary | ICD-10-CM | POA: Diagnosis not present

## 2014-03-20 HISTORY — DX: Anxiety disorder, unspecified: F41.9

## 2014-03-20 HISTORY — DX: Depression, unspecified: F32.A

## 2014-03-20 HISTORY — DX: Unspecified osteoarthritis, unspecified site: M19.90

## 2014-03-20 HISTORY — DX: Major depressive disorder, single episode, unspecified: F32.9

## 2014-03-20 HISTORY — DX: Anemia, unspecified: D64.9

## 2014-03-20 HISTORY — DX: Gout, unspecified: M10.9

## 2014-03-20 LAB — BASIC METABOLIC PANEL
ANION GAP: 6 (ref 5–15)
BUN: 33 mg/dL — ABNORMAL HIGH (ref 6–23)
CALCIUM: 9.4 mg/dL (ref 8.4–10.5)
CO2: 28 mmol/L (ref 19–32)
Chloride: 106 mEq/L (ref 96–112)
Creatinine, Ser: 1.19 mg/dL — ABNORMAL HIGH (ref 0.50–1.10)
GFR calc Af Amer: 52 mL/min — ABNORMAL LOW (ref >90)
GFR calc non Af Amer: 44 mL/min — ABNORMAL LOW (ref >90)
Glucose, Bld: 99 mg/dL (ref 70–99)
Potassium: 4.6 mmol/L (ref 3.5–5.1)
SODIUM: 140 mmol/L (ref 135–145)

## 2014-03-20 LAB — CBC WITH DIFFERENTIAL/PLATELET
BASOS ABS: 0.1 10*3/uL (ref 0.0–0.1)
Basophils Relative: 2 % — ABNORMAL HIGH (ref 0–1)
EOS PCT: 4 % (ref 0–5)
Eosinophils Absolute: 0.2 10*3/uL (ref 0.0–0.7)
HCT: 34.9 % — ABNORMAL LOW (ref 36.0–46.0)
HEMOGLOBIN: 11.1 g/dL — AB (ref 12.0–15.0)
LYMPHS ABS: 1.3 10*3/uL (ref 0.7–4.0)
Lymphocytes Relative: 35 % (ref 12–46)
MCH: 29.8 pg (ref 26.0–34.0)
MCHC: 31.8 g/dL (ref 30.0–36.0)
MCV: 93.6 fL (ref 78.0–100.0)
MONOS PCT: 11 % (ref 3–12)
Monocytes Absolute: 0.4 10*3/uL (ref 0.1–1.0)
Neutro Abs: 1.8 10*3/uL (ref 1.7–7.7)
Neutrophils Relative %: 48 % (ref 43–77)
Platelets: 244 10*3/uL (ref 150–400)
RBC: 3.73 MIL/uL — ABNORMAL LOW (ref 3.87–5.11)
RDW: 14.5 % (ref 11.5–15.5)
WBC: 3.8 10*3/uL — ABNORMAL LOW (ref 4.0–10.5)

## 2014-03-20 LAB — TYPE AND SCREEN
ABO/RH(D): A POS
Antibody Screen: NEGATIVE

## 2014-03-20 LAB — PROTIME-INR
INR: 1.08 (ref 0.00–1.49)
PROTHROMBIN TIME: 14.1 s (ref 11.6–15.2)

## 2014-03-20 LAB — SURGICAL PCR SCREEN
MRSA, PCR: NEGATIVE
Staphylococcus aureus: NEGATIVE

## 2014-03-20 LAB — APTT: APTT: 27 s (ref 24–37)

## 2014-03-20 NOTE — Pre-Procedure Instructions (Signed)
Patient given informatiopn to sign up for my chart at home.

## 2014-03-23 ENCOUNTER — Other Ambulatory Visit: Payer: Self-pay | Admitting: *Deleted

## 2014-03-23 NOTE — H&P (Signed)
Patient ID: Heather [name], female   DOB: [birth year], 74 y.o.   MRN: [id number]  SUBJECTIVE: Heather [name] is a 74 y.o. female presents with onset of pain in the left shoulder x2 months associated with decreased range of motion. She complains of sharp throbbing 10 out of 10 constant pain which wakes her up at night. She does get relief from heat. She has painful for elevation.  Her review of systems is notable for blurred vision wheezing heartburn joint pain swelling itching depression and seasonal allergies. The remaining systems were reviewed and were negative  Past Medical History  Diagnosis Date  . HTN (hypertension)   . COPD (chronic obstructive pulmonary disease)   . GERD (gastroesophageal reflux disease)   . Glaucoma   . Anxiety   . Depression   . Anemia   . Arthritis   . Gout    Past Surgical History  Procedure Laterality Date  . Joint replacement Bilateral     hip   . Joint replacement Right     knee  . Shoulder arthroscopy Right   . Abdominal hysterectomy    . Foot surgery Left     repair of "kissing Cousins"  . Bunionectomy Bilateral   . Back surgery      lumbar-disc   No family history on file. History  Substance Use Topics  . Smoking status: Former Smoker -- 1.00 packs/day for 40 years    Types: Cigarettes    Quit date: 03/20/1985  . Smokeless tobacco: Not on file  . Alcohol Use: No   No current facility-administered medications for this encounter.  Current outpatient prescriptions:  .  Acetaminophen-Codeine 300-30 MG per tablet, Take 1 tablet by mouth every 4 (four) hours as needed for pain., Disp: 60 tablet, Rfl: 0 .  aspirin EC 81 MG tablet, Take 81 mg by mouth every morning., Disp: , Rfl:  .  Calcium-Magnesium-Zinc 333-133-5 MG TABS, Take 1 capsule by mouth daily., Disp: , Rfl:  .  cetirizine (ZYRTEC) 10 MG tablet, Take 10 mg by mouth every morning., Disp: , Rfl:  .  cholecalciferol (VITAMIN D) 1000 UNITS tablet, Take 1,000 Units by mouth at  bedtime., Disp: , Rfl:  .  citalopram (CELEXA) 20 MG tablet, Take 20 mg by mouth every morning., Disp: , Rfl:  .  Fluticasone-Salmeterol (ADVAIR) 250-50 MCG/DOSE AEPB, Inhale 1 puff into the lungs 2 (two) times daily., Disp: , Rfl:  .  furosemide (LASIX) 40 MG tablet, Take 40 mg by mouth daily., Disp: , Rfl:  .  hydrALAZINE (APRESOLINE) 25 MG tablet, Take 1 tablet by mouth 2 (two) times daily., Disp: , Rfl:  .  metoprolol (LOPRESSOR) 100 MG tablet, Take 1 tablet by mouth daily., Disp: , Rfl:  .  Multiple Vitamins-Minerals (SENTRY) TABS, Take 1 tablet by mouth every morning., Disp: , Rfl:  .  nitroGLYCERIN (NITROSTAT) 0.4 MG SL tablet, Place 0.4 mg under the tongue every 5 (five) minutes as needed for chest pain., Disp: , Rfl:  .  Omega-3 Fatty Acids (FISH OIL) 1000 MG CAPS, Take 1 capsule by mouth at bedtime., Disp: , Rfl:  .  omeprazole (PRILOSEC) 20 MG capsule, Take 20 mg by mouth daily., Disp: , Rfl:  .  potassium chloride (MICRO-K) 10 MEQ CR capsule, Take 1 capsule by mouth 2 (two) times daily., Disp: , Rfl:  .  PROAIR HFA 108 (90 BASE) MCG/ACT inhaler, Inhale 2 puffs into the lungs every 6 (six) hours as needed for wheezing or shortness  of breath. , Disp: , Rfl:  .  zolpidem (AMBIEN) 10 MG tablet, Take 10 mg by mouth at bedtime as needed for sleep., Disp: , Rfl:  .  acetaminophen-codeine (TYLENOL #3) 300-30 MG per tablet, Take 1 tablet by mouth every 4 (four) hours as needed for moderate pain. (Patient not taking: Reported on 03/19/2014), Disp: 60 tablet, Rfl: 0 .  predniSONE (DELTASONE) 10 MG tablet, Take 2 tablets (20 mg total) by mouth daily. (Patient not taking: Reported on 03/19/2014), Disp: 6 tablet, Rfl: 0   OBJECTIVE: Vital signs in the office have been stable and within normal limits  Appearance: alert, well appearing, and in no distress, oriented to person, place, and time and normal appearing weight.  Left Shoulder exam: soft tissue tenderness at the subacromial space, reduced  range of motion for elevation internal rotation, subdeltoid tenderness, positive impingement signs, contralateral shoulder exam is normal.  Lower extremities normal  Lymph node system negative  Neurologic evaluation normal  No skin lesions  No pathologic reflexes deep tendon reflexes intact coordination and balance normal. Skin normal 4 extremity.   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  ASSESSMENT: Left Shoulder arthritis  PLAN: Left shoulder arthroplasty total shoulder versus partial arthroplasty if we use a partial arthroplasty we will have to use a CTA prosthesis if the cuff is deficient

## 2014-03-25 ENCOUNTER — Observation Stay (HOSPITAL_COMMUNITY): Payer: Commercial Managed Care - HMO

## 2014-03-25 ENCOUNTER — Inpatient Hospital Stay (HOSPITAL_COMMUNITY): Payer: Commercial Managed Care - HMO | Admitting: Anesthesiology

## 2014-03-25 ENCOUNTER — Encounter (HOSPITAL_COMMUNITY)
Admission: RE | Disposition: A | Discharge status: Skilled Nursing Facility | Payer: Self-pay | Source: Ambulatory Visit | Attending: Orthopedic Surgery

## 2014-03-25 ENCOUNTER — Encounter (HOSPITAL_COMMUNITY): Payer: Self-pay

## 2014-03-25 ENCOUNTER — Observation Stay (HOSPITAL_COMMUNITY)
Admission: RE | Admit: 2014-03-25 | Discharge: 2014-03-26 | Disposition: A | Discharge status: Skilled Nursing Facility | Payer: Commercial Managed Care - HMO | Source: Ambulatory Visit | Attending: Orthopedic Surgery | Admitting: Orthopedic Surgery

## 2014-03-25 DIAGNOSIS — Z96651 Presence of right artificial knee joint: Secondary | ICD-10-CM | POA: Insufficient documentation

## 2014-03-25 DIAGNOSIS — Z87891 Personal history of nicotine dependence: Secondary | ICD-10-CM | POA: Insufficient documentation

## 2014-03-25 DIAGNOSIS — K219 Gastro-esophageal reflux disease without esophagitis: Secondary | ICD-10-CM | POA: Diagnosis not present

## 2014-03-25 DIAGNOSIS — M75122 Complete rotator cuff tear or rupture of left shoulder, not specified as traumatic: Secondary | ICD-10-CM | POA: Diagnosis not present

## 2014-03-25 DIAGNOSIS — I10 Essential (primary) hypertension: Secondary | ICD-10-CM | POA: Diagnosis not present

## 2014-03-25 DIAGNOSIS — F329 Major depressive disorder, single episode, unspecified: Secondary | ICD-10-CM | POA: Insufficient documentation

## 2014-03-25 DIAGNOSIS — M109 Gout, unspecified: Secondary | ICD-10-CM | POA: Insufficient documentation

## 2014-03-25 DIAGNOSIS — Z96643 Presence of artificial hip joint, bilateral: Secondary | ICD-10-CM | POA: Insufficient documentation

## 2014-03-25 DIAGNOSIS — H409 Unspecified glaucoma: Secondary | ICD-10-CM | POA: Diagnosis not present

## 2014-03-25 DIAGNOSIS — S43422A Sprain of left rotator cuff capsule, initial encounter: Secondary | ICD-10-CM | POA: Diagnosis not present

## 2014-03-25 DIAGNOSIS — M19012 Primary osteoarthritis, left shoulder: Secondary | ICD-10-CM | POA: Diagnosis not present

## 2014-03-25 DIAGNOSIS — M12512 Traumatic arthropathy, left shoulder: Secondary | ICD-10-CM

## 2014-03-25 DIAGNOSIS — F419 Anxiety disorder, unspecified: Secondary | ICD-10-CM | POA: Diagnosis not present

## 2014-03-25 DIAGNOSIS — J449 Chronic obstructive pulmonary disease, unspecified: Secondary | ICD-10-CM | POA: Insufficient documentation

## 2014-03-25 DIAGNOSIS — M12819 Other specific arthropathies, not elsewhere classified, unspecified shoulder: Secondary | ICD-10-CM | POA: Insufficient documentation

## 2014-03-25 DIAGNOSIS — Z7982 Long term (current) use of aspirin: Secondary | ICD-10-CM | POA: Diagnosis not present

## 2014-03-25 DIAGNOSIS — Z09 Encounter for follow-up examination after completed treatment for conditions other than malignant neoplasm: Secondary | ICD-10-CM

## 2014-03-25 DIAGNOSIS — Z471 Aftercare following joint replacement surgery: Secondary | ICD-10-CM | POA: Diagnosis not present

## 2014-03-25 DIAGNOSIS — Z96612 Presence of left artificial shoulder joint: Secondary | ICD-10-CM | POA: Diagnosis not present

## 2014-03-25 HISTORY — PX: SHOULDER HEMI-ARTHROPLASTY: SHX5049

## 2014-03-25 SURGERY — HEMIARTHROPLASTY, SHOULDER
Anesthesia: General | Site: Shoulder | Laterality: Left

## 2014-03-25 MED ORDER — DEXAMETHASONE SODIUM PHOSPHATE 4 MG/ML IJ SOLN
4.0000 mg | Freq: Once | INTRAMUSCULAR | Status: AC
  Administered 2014-03-25: 4 mg via INTRAVENOUS

## 2014-03-25 MED ORDER — GLYCOPYRROLATE 0.2 MG/ML IJ SOLN
INTRAMUSCULAR | Status: AC
  Filled 2014-03-25: qty 1

## 2014-03-25 MED ORDER — VITAMIN D 1000 UNITS PO TABS
1000.0000 [IU] | ORAL_TABLET | Freq: Every day | ORAL | Status: DC
  Administered 2014-03-25: 1000 [IU] via ORAL
  Filled 2014-03-25: qty 1

## 2014-03-25 MED ORDER — DOCUSATE SODIUM 100 MG PO CAPS
100.0000 mg | ORAL_CAPSULE | Freq: Two times a day (BID) | ORAL | Status: DC
  Administered 2014-03-25 – 2014-03-26 (×3): 100 mg via ORAL
  Filled 2014-03-25 (×3): qty 1

## 2014-03-25 MED ORDER — BUPIVACAINE-EPINEPHRINE (PF) 0.5% -1:200000 IJ SOLN
INTRAMUSCULAR | Status: AC
  Filled 2014-03-25: qty 60

## 2014-03-25 MED ORDER — BUPIVACAINE-EPINEPHRINE (PF) 0.5% -1:200000 IJ SOLN
INTRAMUSCULAR | Status: DC | PRN
  Administered 2014-03-25: 60 mL

## 2014-03-25 MED ORDER — LACTATED RINGERS IV SOLN
INTRAVENOUS | Status: DC
  Administered 2014-03-25: 10:00:00 via INTRAVENOUS

## 2014-03-25 MED ORDER — ONDANSETRON HCL 4 MG/2ML IJ SOLN
4.0000 mg | Freq: Once | INTRAMUSCULAR | Status: AC
  Administered 2014-03-25: 4 mg via INTRAVENOUS
  Filled 2014-03-25: qty 2

## 2014-03-25 MED ORDER — LIDOCAINE HCL (PF) 1 % IJ SOLN
INTRAMUSCULAR | Status: AC
  Filled 2014-03-25: qty 5

## 2014-03-25 MED ORDER — OMEGA-3-ACID ETHYL ESTERS 1 G PO CAPS
1.0000 g | ORAL_CAPSULE | Freq: Every day | ORAL | Status: DC
  Administered 2014-03-25 – 2014-03-26 (×2): 1 g via ORAL
  Filled 2014-03-25 (×2): qty 1

## 2014-03-25 MED ORDER — METHOCARBAMOL 500 MG PO TABS
500.0000 mg | ORAL_TABLET | Freq: Four times a day (QID) | ORAL | Status: DC | PRN
Start: 2014-03-25 — End: 2014-03-26
  Administered 2014-03-25 – 2014-03-26 (×2): 500 mg via ORAL
  Filled 2014-03-25 (×2): qty 1

## 2014-03-25 MED ORDER — HYDRALAZINE HCL 25 MG PO TABS
25.0000 mg | ORAL_TABLET | Freq: Two times a day (BID) | ORAL | Status: DC
Start: 2014-03-25 — End: 2014-03-26
  Administered 2014-03-26: 25 mg via ORAL
  Filled 2014-03-25 (×2): qty 1

## 2014-03-25 MED ORDER — ALBUTEROL SULFATE (2.5 MG/3ML) 0.083% IN NEBU
INHALATION_SOLUTION | RESPIRATORY_TRACT | Status: AC
  Filled 2014-03-25: qty 3

## 2014-03-25 MED ORDER — SENNA 8.6 MG PO TABS
1.0000 | ORAL_TABLET | Freq: Two times a day (BID) | ORAL | Status: DC
  Administered 2014-03-25 – 2014-03-26 (×3): 8.6 mg via ORAL
  Filled 2014-03-25 (×3): qty 1

## 2014-03-25 MED ORDER — CEFAZOLIN SODIUM-DEXTROSE 2-3 GM-% IV SOLR
2.0000 g | INTRAVENOUS | Status: AC
  Administered 2014-03-25: 2 g via INTRAVENOUS
  Filled 2014-03-25: qty 50

## 2014-03-25 MED ORDER — METOCLOPRAMIDE HCL 10 MG PO TABS: 5.0000 mg | ORAL_TABLET | Freq: Three times a day (TID) | ORAL | Status: DC | PRN

## 2014-03-25 MED ORDER — NITROGLYCERIN 0.4 MG SL SUBL: 0.4000 mg | SUBLINGUAL_TABLET | SUBLINGUAL | Status: DC | PRN

## 2014-03-25 MED ORDER — EPHEDRINE SULFATE 50 MG/ML IJ SOLN
INTRAMUSCULAR | Status: DC | PRN
Start: 2014-03-25 — End: 2014-03-25
  Administered 2014-03-25 (×4): 5 mg via INTRAVENOUS

## 2014-03-25 MED ORDER — POLYETHYLENE GLYCOL 3350 17 G PO PACK: 17.0000 g | PACK | Freq: Every day | ORAL | Status: DC | PRN

## 2014-03-25 MED ORDER — ADULT MULTIVITAMIN W/MINERALS CH
1.0000 | ORAL_TABLET | Freq: Every day | ORAL | Status: DC
  Administered 2014-03-26: 1 via ORAL
  Filled 2014-03-25: qty 1

## 2014-03-25 MED ORDER — PROPOFOL 10 MG/ML IV BOLUS
INTRAVENOUS | Status: DC | PRN
  Administered 2014-03-25: 100 mg via INTRAVENOUS
  Administered 2014-03-25: 20 mg via INTRAVENOUS

## 2014-03-25 MED ORDER — NEOSTIGMINE METHYLSULFATE 10 MG/10ML IV SOLN
INTRAVENOUS | Status: DC | PRN
  Administered 2014-03-25: 2 mg via INTRAVENOUS

## 2014-03-25 MED ORDER — ALBUTEROL SULFATE (2.5 MG/3ML) 0.083% IN NEBU
2.5000 mg | INHALATION_SOLUTION | Freq: Once | RESPIRATORY_TRACT | Status: AC
  Administered 2014-03-25: 2.5 mg via RESPIRATORY_TRACT

## 2014-03-25 MED ORDER — MOMETASONE FURO-FORMOTEROL FUM 100-5 MCG/ACT IN AERO
2.0000 | INHALATION_SPRAY | Freq: Two times a day (BID) | RESPIRATORY_TRACT | Status: DC
  Administered 2014-03-25 – 2014-03-26 (×2): 2 via RESPIRATORY_TRACT
  Filled 2014-03-25: qty 8.8

## 2014-03-25 MED ORDER — LIDOCAINE HCL (PF) 1 % IJ SOLN
INTRAMUSCULAR | Status: AC
  Filled 2014-03-25: qty 2

## 2014-03-25 MED ORDER — CHLORHEXIDINE GLUCONATE 4 % EX LIQD: 60.0000 mL | Freq: Once | CUTANEOUS | Status: AC

## 2014-03-25 MED ORDER — PANTOPRAZOLE SODIUM 40 MG PO TBEC
40.0000 mg | DELAYED_RELEASE_TABLET | Freq: Every day | ORAL | Status: DC
  Administered 2014-03-26: 40 mg via ORAL
  Filled 2014-03-25: qty 1

## 2014-03-25 MED ORDER — ONDANSETRON HCL 4 MG/2ML IJ SOLN: 4.0000 mg | Freq: Four times a day (QID) | INTRAMUSCULAR | Status: DC | PRN

## 2014-03-25 MED ORDER — ALBUTEROL SULFATE (2.5 MG/3ML) 0.083% IN NEBU: 2.5000 mg | INHALATION_SOLUTION | Freq: Four times a day (QID) | RESPIRATORY_TRACT | Status: DC | PRN

## 2014-03-25 MED ORDER — PROPOFOL 10 MG/ML IV BOLUS
INTRAVENOUS | Status: AC
  Filled 2014-03-25: qty 20

## 2014-03-25 MED ORDER — OXYCODONE HCL 5 MG PO TABS
5.0000 mg | ORAL_TABLET | ORAL | Status: DC | PRN
  Administered 2014-03-25: 10 mg via ORAL
  Administered 2014-03-25: 5 mg via ORAL
  Administered 2014-03-26: 10 mg via ORAL
  Administered 2014-03-26: 5 mg via ORAL
  Administered 2014-03-26: 10 mg via ORAL
  Filled 2014-03-25 (×2): qty 2
  Filled 2014-03-25 (×2): qty 1
  Filled 2014-03-25: qty 2
  Filled 2014-03-25: qty 1

## 2014-03-25 MED ORDER — FENTANYL CITRATE 0.05 MG/ML IJ SOLN
25.0000 ug | INTRAMUSCULAR | Status: DC | PRN
  Administered 2014-03-25: 25 ug via INTRAVENOUS
  Filled 2014-03-25: qty 2

## 2014-03-25 MED ORDER — PHENOL 1.4 % MT LIQD: 1.0000 | OROMUCOSAL | Status: DC | PRN

## 2014-03-25 MED ORDER — BUPIVACAINE HCL (PF) 0.25 % IJ SOLN
INTRAMUSCULAR | Status: AC
Start: 2014-03-25 — End: 2014-03-25
  Filled 2014-03-25: qty 30

## 2014-03-25 MED ORDER — CITALOPRAM HYDROBROMIDE 20 MG PO TABS
20.0000 mg | ORAL_TABLET | Freq: Every morning | ORAL | Status: DC
  Administered 2014-03-26: 20 mg via ORAL
  Filled 2014-03-25: qty 1

## 2014-03-25 MED ORDER — DEXTROSE 5 % IV SOLN
500.0000 mg | Freq: Four times a day (QID) | INTRAVENOUS | Status: DC | PRN
  Filled 2014-03-25: qty 5

## 2014-03-25 MED ORDER — ACETAMINOPHEN 325 MG PO TABS
650.0000 mg | ORAL_TABLET | Freq: Four times a day (QID) | ORAL | Status: DC | PRN
  Administered 2014-03-25: 650 mg via ORAL
  Filled 2014-03-25: qty 2

## 2014-03-25 MED ORDER — POTASSIUM CHLORIDE CRYS ER 10 MEQ PO TBCR
10.0000 meq | EXTENDED_RELEASE_TABLET | Freq: Once | ORAL | Status: AC
  Administered 2014-03-25: 10 meq via ORAL
  Filled 2014-03-25: qty 1

## 2014-03-25 MED ORDER — METOPROLOL TARTRATE 50 MG PO TABS
100.0000 mg | ORAL_TABLET | Freq: Every day | ORAL | Status: DC
  Administered 2014-03-26: 100 mg via ORAL
  Filled 2014-03-25: qty 2

## 2014-03-25 MED ORDER — ZOLPIDEM TARTRATE 5 MG PO TABS
5.0000 mg | ORAL_TABLET | Freq: Every evening | ORAL | Status: DC | PRN
Start: 2014-03-25 — End: 2014-03-26

## 2014-03-25 MED ORDER — CEFAZOLIN SODIUM-DEXTROSE 2-3 GM-% IV SOLR
2.0000 g | Freq: Four times a day (QID) | INTRAVENOUS | Status: AC
  Administered 2014-03-25 – 2014-03-26 (×3): 2 g via INTRAVENOUS
  Filled 2014-03-25 (×3): qty 50

## 2014-03-25 MED ORDER — FENTANYL CITRATE 0.05 MG/ML IJ SOLN
INTRAMUSCULAR | Status: DC | PRN
  Administered 2014-03-25: 25 ug via INTRAVENOUS
  Administered 2014-03-25: 50 ug via INTRAVENOUS
  Administered 2014-03-25: 25 ug via INTRAVENOUS
  Administered 2014-03-25: 50 ug via INTRAVENOUS

## 2014-03-25 MED ORDER — MIDAZOLAM HCL 2 MG/2ML IJ SOLN
INTRAMUSCULAR | Status: AC
  Filled 2014-03-25: qty 2

## 2014-03-25 MED ORDER — FUROSEMIDE 40 MG PO TABS
40.0000 mg | ORAL_TABLET | Freq: Every day | ORAL | Status: DC
  Administered 2014-03-25 – 2014-03-26 (×2): 40 mg via ORAL
  Filled 2014-03-25 (×2): qty 1

## 2014-03-25 MED ORDER — GLYCOPYRROLATE 0.2 MG/ML IJ SOLN
INTRAMUSCULAR | Status: DC | PRN
  Administered 2014-03-25: 0.4 mg via INTRAVENOUS

## 2014-03-25 MED ORDER — HYDROMORPHONE HCL 1 MG/ML IJ SOLN
0.5000 mg | INTRAMUSCULAR | Status: DC | PRN
  Administered 2014-03-25: 0.5 mg via INTRAVENOUS
  Filled 2014-03-25: qty 1

## 2014-03-25 MED ORDER — ONDANSETRON HCL 4 MG PO TABS: 4.0000 mg | ORAL_TABLET | Freq: Four times a day (QID) | ORAL | Status: DC | PRN

## 2014-03-25 MED ORDER — ONDANSETRON HCL 4 MG/2ML IJ SOLN
INTRAMUSCULAR | Status: AC
  Filled 2014-03-25: qty 2

## 2014-03-25 MED ORDER — SODIUM CHLORIDE 0.9 % IN NEBU
INHALATION_SOLUTION | RESPIRATORY_TRACT | Status: AC
  Filled 2014-03-25: qty 3

## 2014-03-25 MED ORDER — ASPIRIN EC 81 MG PO TBEC
81.0000 mg | DELAYED_RELEASE_TABLET | Freq: Every morning | ORAL | Status: DC
  Administered 2014-03-25 – 2014-03-26 (×2): 81 mg via ORAL
  Filled 2014-03-25 (×2): qty 1

## 2014-03-25 MED ORDER — METOCLOPRAMIDE HCL 5 MG/ML IJ SOLN: 5.0000 mg | Freq: Three times a day (TID) | INTRAMUSCULAR | Status: DC | PRN

## 2014-03-25 MED ORDER — ONDANSETRON HCL 4 MG/2ML IJ SOLN
4.0000 mg | Freq: Once | INTRAMUSCULAR | Status: AC
  Administered 2014-03-25: 4 mg via INTRAVENOUS

## 2014-03-25 MED ORDER — MIDAZOLAM HCL 2 MG/2ML IJ SOLN
1.0000 mg | INTRAMUSCULAR | Status: DC | PRN
Start: 2014-03-25 — End: 2014-03-25
  Administered 2014-03-25: 2 mg via INTRAVENOUS

## 2014-03-25 MED ORDER — SODIUM CHLORIDE 0.9 % IR SOLN
Status: DC | PRN
  Administered 2014-03-25 (×2): 1000 mL

## 2014-03-25 MED ORDER — MENTHOL 3 MG MT LOZG: 1.0000 | LOZENGE | OROMUCOSAL | Status: DC | PRN

## 2014-03-25 MED ORDER — SODIUM CHLORIDE 0.9 % IJ SOLN
INTRAMUSCULAR | Status: AC
  Filled 2014-03-25: qty 10

## 2014-03-25 MED ORDER — ROCURONIUM BROMIDE 50 MG/5ML IV SOLN
INTRAVENOUS | Status: AC
  Filled 2014-03-25: qty 1

## 2014-03-25 MED ORDER — DEXAMETHASONE SODIUM PHOSPHATE 4 MG/ML IJ SOLN
INTRAMUSCULAR | Status: AC
  Filled 2014-03-25: qty 1

## 2014-03-25 MED ORDER — BISACODYL 10 MG RE SUPP: 10.0000 mg | Freq: Every day | RECTAL | Status: DC | PRN

## 2014-03-25 MED ORDER — EPHEDRINE SULFATE 50 MG/ML IJ SOLN
INTRAMUSCULAR | Status: AC
  Filled 2014-03-25: qty 1

## 2014-03-25 MED ORDER — ROCURONIUM BROMIDE 100 MG/10ML IV SOLN
INTRAVENOUS | Status: DC | PRN
  Administered 2014-03-25: 5 mg via INTRAVENOUS

## 2014-03-25 MED ORDER — ACETAMINOPHEN 650 MG RE SUPP: 650.0000 mg | Freq: Four times a day (QID) | RECTAL | Status: DC | PRN

## 2014-03-25 MED ORDER — LIDOCAINE HCL (CARDIAC) 10 MG/ML IV SOLN
INTRAVENOUS | Status: DC | PRN
  Administered 2014-03-25: 10 mg via INTRAVENOUS

## 2014-03-25 MED ORDER — ALBUTEROL SULFATE HFA 108 (90 BASE) MCG/ACT IN AERS: 2.0000 | INHALATION_SPRAY | Freq: Four times a day (QID) | RESPIRATORY_TRACT | Status: DC | PRN

## 2014-03-25 MED ORDER — SENTRY PO TABS: 1.0000 | ORAL_TABLET | Freq: Every morning | ORAL | Status: DC

## 2014-03-25 MED ORDER — LORATADINE 10 MG PO TABS
10.0000 mg | ORAL_TABLET | Freq: Every day | ORAL | Status: DC
  Administered 2014-03-26: 10 mg via ORAL
  Filled 2014-03-25: qty 1

## 2014-03-25 MED ORDER — BUPIVACAINE HCL (PF) 0.25 % IJ SOLN
INTRAMUSCULAR | Status: AC
  Filled 2014-03-25: qty 30

## 2014-03-25 MED ORDER — ONDANSETRON HCL 4 MG/2ML IJ SOLN: 4.0000 mg | Freq: Once | INTRAMUSCULAR | Status: DC | PRN

## 2014-03-25 MED ORDER — METHOCARBAMOL 1000 MG/10ML IJ SOLN
500.0000 mg | Freq: Once | INTRAVENOUS | Status: AC
  Administered 2014-03-25: 500 mg via INTRAVENOUS
  Filled 2014-03-25: qty 5

## 2014-03-25 MED ORDER — HYDROCODONE-ACETAMINOPHEN 5-325 MG PO TABS
2.0000 | ORAL_TABLET | Freq: Once | ORAL | Status: AC
  Administered 2014-03-25: 2 via ORAL
  Filled 2014-03-25: qty 2

## 2014-03-25 MED ORDER — FENTANYL CITRATE 0.05 MG/ML IJ SOLN
INTRAMUSCULAR | Status: AC
  Filled 2014-03-25: qty 5

## 2014-03-25 MED ORDER — HYDROCODONE-ACETAMINOPHEN 10-325 MG PO TABS
1.0000 | ORAL_TABLET | ORAL | Status: DC | PRN
  Administered 2014-03-25: 1 via ORAL
  Filled 2014-03-25: qty 1

## 2014-03-25 MED ORDER — ALUM & MAG HYDROXIDE-SIMETH 200-200-20 MG/5ML PO SUSP: 30.0000 mL | ORAL | Status: DC | PRN

## 2014-03-25 SURGICAL SUPPLY — 79 items
ATTRACTOMAT 16X20 MAGNETIC DRP (DRAPES) IMPLANT
BAG HAMPER (MISCELLANEOUS) ×4 IMPLANT
BLADE SAW SGTL 83.5X18.5 (BLADE) ×4 IMPLANT
BNDG COHESIVE 4X5 TAN STRL (GAUZE/BANDAGES/DRESSINGS) ×4 IMPLANT
BOWL SMART MIX CTS (DISPOSABLE) IMPLANT
BUR W/OAL 277010062S2 (BURR) IMPLANT
CAPT SHLDR PARTIAL 1 ×4 IMPLANT
CATH KIT ON Q 2.5IN SLV (PAIN MANAGEMENT) IMPLANT
CEMENT HV SMART SET (Cement) IMPLANT
CHLORAPREP W/TINT 26ML (MISCELLANEOUS) ×4 IMPLANT
CLOTH BEACON ORANGE TIMEOUT ST (SAFETY) ×4 IMPLANT
COOLER CRYO CUFF IC AND MOTOR (MISCELLANEOUS) IMPLANT
COOLER CRYO IC GRAV AND TUBE (ORTHOPEDIC SUPPLIES) IMPLANT
COVER LIGHT HANDLE STERIS (MISCELLANEOUS) ×8 IMPLANT
COVER PROBE W GEL 5X96 (DRAPES) ×4 IMPLANT
CUFF CRYO UNI SHDR 32X48 (MISCELLANEOUS) IMPLANT
DECANTER SPIKE VIAL GLASS SM (MISCELLANEOUS) ×8 IMPLANT
DRAPE ORTHO 2.5IN SPLIT 77X108 (DRAPES) ×4 IMPLANT
DRAPE ORTHO SPLIT 77X108 STRL (DRAPES) ×4
DRAPE PROXIMA HALF (DRAPES) ×4 IMPLANT
DRAPE SHOULDER BEACH CHAIR (DRAPES) ×4 IMPLANT
DRAPE U-SHAPE 47X51 STRL (DRAPES) ×4 IMPLANT
DRESSING ALLEVYN BORDER HEEL (GAUZE/BANDAGES/DRESSINGS) IMPLANT
DRSG MEPILEX BORDER 4X12 (GAUZE/BANDAGES/DRESSINGS) ×4 IMPLANT
DRSG MEPILEX BORDER 4X8 (GAUZE/BANDAGES/DRESSINGS) IMPLANT
ELECT REM PT RETURN 9FT ADLT (ELECTROSURGICAL) ×4
ELECTRODE REM PT RTRN 9FT ADLT (ELECTROSURGICAL) ×2 IMPLANT
FORMALIN 10 PREFIL 480ML (MISCELLANEOUS) ×4 IMPLANT
GAUZE SPONGE 4X4 12PLY STRL (GAUZE/BANDAGES/DRESSINGS) IMPLANT
GAUZE XEROFORM 5X9 LF (GAUZE/BANDAGES/DRESSINGS) IMPLANT
GLOVE BIO SURGEON STRL SZ 6.5 (GLOVE) ×3 IMPLANT
GLOVE BIO SURGEONS STRL SZ 6.5 (GLOVE) ×1
GLOVE BIOGEL PI IND STRL 7.0 (GLOVE) ×4 IMPLANT
GLOVE BIOGEL PI IND STRL 7.5 (GLOVE) ×2 IMPLANT
GLOVE BIOGEL PI INDICATOR 7.0 (GLOVE) ×4
GLOVE BIOGEL PI INDICATOR 7.5 (GLOVE) ×2
GLOVE ECLIPSE 6.5 STRL STRAW (GLOVE) ×4 IMPLANT
GLOVE ECLIPSE 7.0 STRL STRAW (GLOVE) ×8 IMPLANT
GLOVE SKINSENSE NS SZ8.0 LF (GLOVE) ×2
GLOVE SKINSENSE STRL SZ8.0 LF (GLOVE) ×2 IMPLANT
GLOVE SS N UNI LF 8.5 STRL (GLOVE) ×4 IMPLANT
GOWN STRL REUS W/TWL LRG LVL3 (GOWN DISPOSABLE) ×16 IMPLANT
GOWN STRL REUS W/TWL XL LVL3 (GOWN DISPOSABLE) ×4 IMPLANT
HANDPIECE INTERPULSE COAX TIP (DISPOSABLE)
HEMOSTAT SURGICEL 4X8 (HEMOSTASIS) IMPLANT
HOOD W/PEELAWAY (MISCELLANEOUS) IMPLANT
IMMOBILIZER SHOULDER LGE (ORTHOPEDIC SUPPLIES) ×4 IMPLANT
INST SET MINOR BONE (KITS) ×4 IMPLANT
IV NS IRRIG 3000ML ARTHROMATIC (IV SOLUTION) IMPLANT
KIT ROOM TURNOVER APOR (KITS) ×4 IMPLANT
MANIFOLD NEPTUNE II (INSTRUMENTS) ×4 IMPLANT
MARKER SKIN DUAL TIP RULER LAB (MISCELLANEOUS) ×4 IMPLANT
NEEDLE HYPO 18GX1.5 BLUNT FILL (NEEDLE) IMPLANT
NEEDLE HYPO 21X1.5 SAFETY (NEEDLE) IMPLANT
NEEDLE MAYO 6 CRC TAPER PT (NEEDLE) ×4 IMPLANT
NS IRRIG 1000ML POUR BTL (IV SOLUTION) ×8 IMPLANT
PACK BASIC III (CUSTOM PROCEDURE TRAY) ×2
PACK BASIC LIMB (CUSTOM PROCEDURE TRAY) ×4 IMPLANT
PACK SRG BSC III STRL LF ECLPS (CUSTOM PROCEDURE TRAY) ×2 IMPLANT
PAD ABD 5X9 TENDERSORB (GAUZE/BANDAGES/DRESSINGS) IMPLANT
PASSER SUT CAPTURE FIRST (SUTURE) IMPLANT
SET BASIN LINEN APH (SET/KITS/TRAYS/PACK) ×4 IMPLANT
SET HNDPC FAN SPRY TIP SCT (DISPOSABLE) IMPLANT
SPONGE LAP 18X18 X RAY DECT (DISPOSABLE) ×4 IMPLANT
STAPLER VISISTAT 35W (STAPLE) ×4 IMPLANT
SUT BRALON NAB BRD #1 30IN (SUTURE) ×8 IMPLANT
SUT ETHIBOND NAB OS 4 #2 30IN (SUTURE) ×8 IMPLANT
SUT ETHILON 3 0 FSL (SUTURE) IMPLANT
SUT MON AB 2-0 CT1 36 (SUTURE) ×4 IMPLANT
SUT NUROLON CT 2 BLK #1 18IN (SUTURE) IMPLANT
SUT VIC AB 1 CT1 27 (SUTURE) ×14
SUT VIC AB 1 CT1 27XBRD ANTBC (SUTURE) ×14 IMPLANT
SYR 30ML LL (SYRINGE) ×4 IMPLANT
SYR BULB IRRIGATION 50ML (SYRINGE) ×8 IMPLANT
SYRINGE 10CC LL (SYRINGE) ×4 IMPLANT
TOWEL OR 17X26 4PK STRL BLUE (TOWEL DISPOSABLE) ×4 IMPLANT
TOWER CARTRIDGE SMART MIX (DISPOSABLE) IMPLANT
YANKAUER SUCT 12FT TUBE ARGYLE (SUCTIONS) ×4 IMPLANT
YANKAUER SUCT BULB TIP NO VENT (SUCTIONS) ×4 IMPLANT

## 2014-03-25 NOTE — Anesthesia Postprocedure Evaluation (Signed)
  Anesthesia Post-op Note  Patient: Heather [name]  Procedure(s) Performed: Procedure(s): LEFT SHOULDER HEMI-ARTHROPLASTY (Left)  Patient Location: PACU  Anesthesia Type:General  Level of Consciousness: awake and patient cooperative  Airway and Oxygen Therapy: Patient Spontanous Breathing and non-rebreather face mask  Post-op Pain: moderate  Post-op Assessment: Post-op Vital signs reviewed, Patient's Cardiovascular Status Stable, Respiratory Function Stable, Patent Airway and No signs of Nausea or vomiting  Post-op Vital Signs: Reviewed and stable  Last Vitals:  Filed Vitals:   03/25/14 1232  BP: 120/56  Pulse: 77  Temp: 37.1 C  Resp: 15    Complications: No apparent anesthesia complications

## 2014-03-25 NOTE — Op Note (Signed)
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  Operative findings the biceps tendon was intact although was hypertrophied. The rotator cuff supra space simple spinatus tendons were torn. Subscapularis was intact. Glenoid humeral arthritis was noted on the humeral and glenoid side. Osteophytes are noted also in the anteroinferior humeral head and neck.  Blood loss minimal  Details of procedure the site of surgery was confirmed and marked in the preop holding area. The chart was reviewed and updated. The patient was taken to the operating room for general anesthesia and appropriate antibiotics using Ancef. She was placed in the modified beachchair position  The arm was prepped and draped sterilely with ChloraPrep  The skin was infiltrated with dilute Marcaine with epinephrine solution and a deltopectoral incision was made. The cephalic vein was found and multiple branches were coagulated. It was taken laterally with the deltoid and blunt dissection was carried out a deltopectoral interval and the superior aspect of the pectoralis tendon was released  Deep retractors were placed  The strap muscles were identified and finger dissection was used to palpate the muscular cutaneous nerve. The arms and externally rotated retractors were placed deeper and the subscapularis was incised 1.5 cm from its attachment site. Tagging sutures were placed and the tendon was retracted medially.  The humerus was subsequent subluxated forward the biceps tendon was identified the sheath was opened and the biceps tendon was found to be intact but hypertrophied. There was no evidence of any supraspinatus or infraspinatus tendon as it was retracted leaving significant arthritis on the humeral side  and mild arthritis on the glenoid side.  The patient has an os acromiale therefore hemiarthroplasty was placed instead of a CTA hemiarthroplasty  The arm was placed in 30 of external rotation. Cutting guide was placed on the proximal humerus and the humeral head was removed. It measured 48 x 18 mm.  Posterior to the bicipital groove a pilot hole was started and serial reaming was performed up to a size 12. A trial size 12 broach was placed and a trial reduction was done with a 48 x 18 mm humeral head. The arm was placed in external rotation at 30 the subscapularis sutures were used to temporarily reapproximate the tendon. The tendon was under anatomic tension. Excellent range of motion was reestablished with the trial implants  The trial implants were then removed. The size 12 humerus was placed and a second trial reduction was performed with the trial 48 x 18 mm head. No change in the range of motion or stability of the shoulder was noted at this implant and therefore a 48 x 18 mm metal head was placed. The wound was irrigated coagulation was performed using the Bovie.  #1 Bralon sutures 5 were used to reapproximate the subscapularis tendon rotator interval was repaired with absorbable suture. A second irrigation was performed and the subcutaneous tissue was closed with 0 Monocryl and 2-0 Monocryl with the skin reapproximated with staples  Subcutaneous tissues were infiltrated with 30 mL of Marcaine with epinephrine  A shoulder immobilizer was placed in the postoperative film showed stable prosthesis without complication.

## 2014-03-25 NOTE — Anesthesia Procedure Notes (Addendum)
Procedure Name: Intubation Date/Time: 03/25/2014 10:16 AM Performed by: Franco Nones Pre-anesthesia Checklist: Patient identified, Patient being monitored, Timeout performed, Emergency Drugs available and Suction available Patient Re-evaluated:Patient Re-evaluated prior to inductionOxygen Delivery Method: Circle System Utilized Preoxygenation: Pre-oxygenation with 100% oxygen Intubation Type: IV induction, Rapid sequence and Cricoid Pressure applied Ventilation: Mask ventilation without difficulty Laryngoscope Size: Miller and 2 Grade View: Grade I Tube type: Oral Tube size: 7.0 mm Number of attempts: 1 Airway Equipment and Method: Stylet and Oral airway Placement Confirmation: ETT inserted through vocal cords under direct vision,  positive ETCO2 and breath sounds checked- equal and bilateral Secured at: 20 cm Tube secured with: Tape Dental Injury: Teeth and Oropharynx as per pre-operative assessment

## 2014-03-25 NOTE — Progress Notes (Signed)
0940 Sacral allevyn Life protective dressing applied to sacrum.

## 2014-03-25 NOTE — Anesthesia Preprocedure Evaluation (Addendum)
Anesthesia Evaluation  Patient identified by MRN, date of birth, ID band Patient awake    Airway Mallampati: II  TM Distance: >3 FB     Dental  (+) Edentulous Upper, Partial Lower   Pulmonary COPDformer smoker,  breath sounds clear to auscultation        Cardiovascular hypertension, Pt. on medications Rhythm:Regular Rate:Normal     Neuro/Psych PSYCHIATRIC DISORDERS Anxiety Depression    GI/Hepatic GERD-  Medicated and Controlled,  Endo/Other    Renal/GU      Musculoskeletal  (+) Arthritis -,   Abdominal   Peds  Hematology  (+) anemia ,   Anesthesia Other Findings   Reproductive/Obstetrics                            Anesthesia Physical Anesthesia Plan  ASA: III  Anesthesia Plan: General   Post-op Pain Management:    Induction: Intravenous, Rapid sequence and Cricoid pressure planned  Airway Management Planned: Oral ETT  Additional Equipment:   Intra-op Plan:   Post-operative Plan: Extubation in OR  Informed Consent: I have reviewed the patients History and Physical, chart, labs and discussed the procedure including the risks, benefits and alternatives for the proposed anesthesia with the patient or authorized representative who has indicated his/her understanding and acceptance.     Plan Discussed with:   Anesthesia Plan Comments:         Anesthesia Quick Evaluation

## 2014-03-25 NOTE — Telephone Encounter (Signed)
03/25/14 Additional CPT 23470 for surgery as noted, [facility], 03/25/14; noted in [facility] portal, per Authorization# [id number].

## 2014-03-25 NOTE — Transfer of Care (Signed)
Immediate Anesthesia Transfer of Care Note  Patient: Heather [name]  Procedure(s) Performed: Procedure(s): LEFT SHOULDER HEMI-ARTHROPLASTY (Left)  Patient Location: PACU  Anesthesia Type:General  Level of Consciousness: sedated and patient cooperative  Airway & Oxygen Therapy: Patient Spontanous Breathing and non-rebreather face mask  Post-op Assessment: Report given to PACU RN, Post -op Vital signs reviewed and stable and Patient moving all extremities  Post vital signs: Reviewed and stable  Complications: No apparent anesthesia complications

## 2014-03-25 NOTE — Brief Op Note (Signed)
03/25/2014  12:35 PM  PATIENT:  Heather [name]  74 y.o. 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  depuy shoulder size 12 with a 48 x18 head   PROCEDURE:  Procedure(s): LEFT SHOULDER HEMI-ARTHROPLASTY (Left)  SURGEON:  Surgeon(s) and Role:    * Vickki Hearing, MD - Primary  PHYSICIAN ASSISTANT:   ASSISTANTS: betty ashley    ANESTHESIA:   general  EBL:  Total I/O In: 900 [I.V.:900] Out: 50 [Blood:50]  BLOOD ADMINISTERED:none  DRAINS: none   LOCAL MEDICATIONS USED:  MARCAINE     SPECIMEN:  No Specimen  DISPOSITION OF SPECIMEN:  N/A  COUNTS:  YES  TOURNIQUET:  * No tourniquets in log *  DICTATION: .Dragon Dictation  PLAN OF CARE: Admit for overnight observation  PATIENT DISPOSITION:  PACU - hemodynamically stable.   Delay start of Pharmacological VTE agent (>24hrs) due to surgical blood loss or risk of bleeding: not applicable

## 2014-03-25 NOTE — Interval H&P Note (Signed)
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 After consideration of risks, benefits and other options for treatment, the patient has consented to  Procedure(s): TOTAL SHOULDER ARTHROPLASTY (Left) as a surgical intervention .  The patient's history has been reviewed, patient examined, no change in status, stable for surgery.  I have reviewed the patient's chart and labs.  Questions were answered to the patient's satisfaction.   Possible hemi   Fuller [place]

## 2014-03-26 ENCOUNTER — Inpatient Hospital Stay
Admission: RE | Admit: 2014-03-26 | Discharge: 2014-04-18 | Disposition: A | Discharge status: Home / Self Care | Payer: Commercial Managed Care - HMO | Source: Ambulatory Visit | Attending: Internal Medicine | Admitting: Internal Medicine

## 2014-03-26 DIAGNOSIS — M25512 Pain in left shoulder: Secondary | ICD-10-CM | POA: Diagnosis not present

## 2014-03-26 DIAGNOSIS — F329 Major depressive disorder, single episode, unspecified: Secondary | ICD-10-CM | POA: Diagnosis not present

## 2014-03-26 DIAGNOSIS — H04123 Dry eye syndrome of bilateral lacrimal glands: Secondary | ICD-10-CM | POA: Diagnosis not present

## 2014-03-26 DIAGNOSIS — J410 Simple chronic bronchitis: Secondary | ICD-10-CM | POA: Diagnosis not present

## 2014-03-26 DIAGNOSIS — N289 Disorder of kidney and ureter, unspecified: Secondary | ICD-10-CM | POA: Diagnosis not present

## 2014-03-26 DIAGNOSIS — K219 Gastro-esophageal reflux disease without esophagitis: Secondary | ICD-10-CM | POA: Diagnosis not present

## 2014-03-26 DIAGNOSIS — F418 Other specified anxiety disorders: Secondary | ICD-10-CM | POA: Diagnosis not present

## 2014-03-26 DIAGNOSIS — M75122 Complete rotator cuff tear or rupture of left shoulder, not specified as traumatic: Secondary | ICD-10-CM | POA: Diagnosis not present

## 2014-03-26 DIAGNOSIS — M12519 Traumatic arthropathy, unspecified shoulder: Secondary | ICD-10-CM | POA: Diagnosis not present

## 2014-03-26 DIAGNOSIS — F419 Anxiety disorder, unspecified: Secondary | ICD-10-CM | POA: Diagnosis not present

## 2014-03-26 DIAGNOSIS — M12512 Traumatic arthropathy, left shoulder: Secondary | ICD-10-CM | POA: Diagnosis not present

## 2014-03-26 DIAGNOSIS — R609 Edema, unspecified: Secondary | ICD-10-CM | POA: Diagnosis not present

## 2014-03-26 DIAGNOSIS — I10 Essential (primary) hypertension: Secondary | ICD-10-CM | POA: Diagnosis not present

## 2014-03-26 DIAGNOSIS — M1A09X0 Idiopathic chronic gout, multiple sites, without tophus (tophi): Secondary | ICD-10-CM | POA: Diagnosis not present

## 2014-03-26 DIAGNOSIS — D649 Anemia, unspecified: Secondary | ICD-10-CM | POA: Diagnosis not present

## 2014-03-26 DIAGNOSIS — M109 Gout, unspecified: Secondary | ICD-10-CM | POA: Diagnosis not present

## 2014-03-26 DIAGNOSIS — M6281 Muscle weakness (generalized): Secondary | ICD-10-CM | POA: Diagnosis not present

## 2014-03-26 DIAGNOSIS — M19012 Primary osteoarthritis, left shoulder: Secondary | ICD-10-CM | POA: Diagnosis not present

## 2014-03-26 DIAGNOSIS — N179 Acute kidney failure, unspecified: Secondary | ICD-10-CM | POA: Diagnosis not present

## 2014-03-26 DIAGNOSIS — R040 Epistaxis: Secondary | ICD-10-CM | POA: Diagnosis not present

## 2014-03-26 DIAGNOSIS — H409 Unspecified glaucoma: Secondary | ICD-10-CM | POA: Diagnosis not present

## 2014-03-26 DIAGNOSIS — J449 Chronic obstructive pulmonary disease, unspecified: Secondary | ICD-10-CM | POA: Diagnosis not present

## 2014-03-26 DIAGNOSIS — Z96612 Presence of left artificial shoulder joint: Secondary | ICD-10-CM | POA: Diagnosis not present

## 2014-03-26 MED ORDER — HYDROCODONE-ACETAMINOPHEN 10-325 MG PO TABS: 1.0000 | ORAL_TABLET | ORAL | Status: DC | PRN

## 2014-03-26 NOTE — Clinical Social Work Note (Addendum)
CSW presented bed offers and pt chooses [facility]. Facility notified and authorization received. Pt to transfer with RN. D/C summary faxed. CSW called pt's friend, Marcelle Smiling and notified her of transfer. MD will take script to facility later today.  Derenda Fennel, [place] [phone]

## 2014-03-26 NOTE — Evaluation (Signed)
Physical Therapy Evaluation Patient Details Name: Heather [name] MRN: [id number] DOB: 11/07/1940 Today's Date: 03/26/2014   History of Present Illness  Heather [name] is a 74 y.o. female presents with onset of pain in the left shoulder x2 months associated with decreased range of motion. She complains of sharp throbbing 10 out of 10 constant pain which wakes her up at night. Patient underwent a left rotator cuff arthroscopy and is to wear a sling 24/7.   Clinical Impression   Pt was seen for evaluation.  She was extremely pleasant and cooperative, LUE in sling with pain well controlled.  She is quite limited in ADLs due to her inability to use her LUE.  Transfers are quite labored and gait is mildly unsteady due to the change in her balance with LUE in a sling.  She would benefit from short term SNF in order to assist her in regaining safe mobility.    Follow Up Recommendations SNF    Equipment Recommendations  None recommended by PT    Recommendations for Other Services   none    Precautions / Restrictions Precautions Precautions: Shoulder;Fall Shoulder Interventions: Shoulder sling/immobilizer;At all times;Off for dressing/bathing/exercises Precaution Comments: Educational handout given Restrictions Weight Bearing Restrictions: Yes LUE Weight Bearing: Non weight bearing      Mobility  Bed Mobility Overal bed mobility: Modified Independent             General bed mobility comments: transfer is very labored and slow but able to perform without assistance  Transfers Overall transfer level: Needs assistance Equipment used: Straight cane;None Transfers: Sit to/from Stand Sit to Stand: Min guard         General transfer comment: balance is slightly altered due to LUE in a sling...she feels mildly unstable in stance  Ambulation/Gait Ambulation/Gait assistance: Min guard Ambulation Distance (Feet): 80 Feet Assistive device: Straight cane;None Gait Pattern/deviations:  Decreased dorsiflexion - right;Decreased dorsiflexion - left;Step-through pattern   Gait velocity interpretation: Below normal speed for age/gender General Gait Details: waddling gait pattern...offered cane to assist with gait but pt was unsure that it was helping her...she was mildly unstable with turns and needed close supervision  Stairs            Wheelchair Mobility    Modified Rankin (Stroke Patients Only)       Balance Overall balance assessment: Needs assistance   Sitting balance-Leahy Scale: Good     Standing balance support: No upper extremity supported;During functional activity Standing balance-Leahy Scale: Fair                               Pertinent Vitals/Pain Pain Assessment: No/denies pain (at rest) Pain Score: 9  Pain Descriptors / Indicators: Aching;Constant Pain Intervention(s): Patient requesting pain meds-RN notified    Home Living Family/patient expects to be discharged to:: Other (Comment) Living Arrangements: Alone Available Help at Discharge: Friend(s);Available PRN/intermittently Type of Home: Apartment Home Access: Level entry     Home Layout: One level Home Equipment: Cane - quad;Shower seat;Grab bars - tub/shower;Hand held shower head      Prior Function Level of Independence: Independent         Comments: reading, going to the gym (Which she has been unable since her shoulder pain)     Hand Dominance   Dominant Hand: Right    Extremity/Trunk Assessment   Upper Extremity Assessment: LUE deficits/detail       LUE Deficits / Details:  recent rotator cuff arthroplasty   Lower Extremity Assessment: Overall WFL for tasks assessed (s/p bilateral THR and right TKR)      Cervical / Trunk Assessment: Kyphotic  Communication   Communication: No difficulties  Cognition Arousal/Alertness: Awake/alert Behavior During Therapy: WFL for tasks assessed/performed Overall Cognitive Status: Within Functional Limits  for tasks assessed                      General Comments      Exercises Donning/doffing shirt without moving shoulder: Maximal assistance Correct positioning of sling/immobilizer: Maximal assistance ROM for elbow, wrist and digits of operated UE: Independent Sling wearing schedule (on at all times/off for ADL's): Independent Proper positioning of operated UE when showering: Set-up Positioning of UE while sleeping: Set-up      Assessment/Plan    PT Assessment All further PT needs can be met in the next venue of care  PT Diagnosis Difficulty walking   PT Problem List Decreased activity tolerance;Decreased balance;Decreased mobility;Obesity  PT Treatment Interventions     PT Goals (Current goals can be found in the Care Plan section) Acute Rehab PT Goals PT Goal Formulation: All assessment and education complete, DC therapy    Frequency     Barriers to discharge  lives alone, no family here      Co-evaluation               End of Session Equipment Utilized During Treatment: Gait belt Activity Tolerance: Patient tolerated treatment well Patient left: in chair;with call bell/phone within reach      Functional Assessment Tool Used: clinical judgement Functional Limitation: Mobility: Walking and moving around Mobility: Walking and Moving Around Current Status (P0051): At least 1 percent but less than 20 percent impaired, limited or restricted Mobility: Walking and Moving Around Goal Status [phone]): At least 1 percent but less than 20 percent impaired, limited or restricted Mobility: Walking and Moving Around Discharge Status [phone]): At least 1 percent but less than 20 percent impaired, limited or restricted    Time: 7014-1030 PT Time Calculation (min) (ACUTE ONLY): 40 min   Charges:   PT Evaluation $Initial PT Evaluation Tier I: 1 Procedure     PT G Codes:   PT G-Codes **NOT FOR INPATIENT CLASS** Functional Assessment Tool Used: clinical  judgement Functional Limitation: Mobility: Walking and moving around Mobility: Walking and Moving Around Current Status (D3143): At least 1 percent but less than 20 percent impaired, limited or restricted Mobility: Walking and Moving Around Goal Status [phone]): At least 1 percent but less than 20 percent impaired, limited or restricted Mobility: Walking and Moving Around Discharge Status [phone]): At least 1 percent but less than 20 percent impaired, limited or restricted    Sable Feil 03/26/2014, 11:45 AM

## 2014-03-26 NOTE — Addendum Note (Signed)
Addendum  created 03/26/14 1122 by Earleen Newport, CRNA   Modules edited: Notes Section   Notes Section:  File: [id number]

## 2014-03-26 NOTE — Discharge Summary (Addendum)
Physician Discharge Summary  Patient ID: Heather [name] MRN: [id number] DOB/AGE: 73-Jul-1942 73 y.o.  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 Left shoulder hemiarthroplasty for rotator cuff arthropathy. Surgical findings repairable rotator cuff tear left shoulder with osteoarthritis of the glenohumeral joint. On postop day 1 she was stable. Unfortunately she has no one to help her at home and will be sent to the [facility] for rehabilitation and assistance with daily activities  Discharge Exam: Blood pressure 109/62, pulse 88, temperature 99.6 F (37.6 C), temperature source Oral, resp. rate 20, height  (1.6 m), weight 197 lb (89.359 kg), SpO2 96 %. Incision/Wound: the dressing is dry and intact with scant drainage  Disposition: 01-Home or Self Care  Discharge Instructions    Diet - low sodium heart healthy    Complete by:  As directed      Discharge instructions    Complete by:  As directed   Wear shoulder immobilizer at all times     Discharge wound care:    Complete by:  As directed   Dressing change as needed     Increase activity slowly    Complete by:  As directed      Lifting restrictions    Complete by:  As directed   No lifting left arm            Medication List    STOP taking these medications        Acetaminophen-Codeine 300-30 MG per tablet     predniSONE 10 MG tablet  Commonly known as:  DELTASONE      TAKE these medications        aspirin EC 81 MG tablet  Take 81 mg by mouth every morning.     Calcium-Magnesium-Zinc 333-133-5 MG Tabs  Take 1 capsule by mouth daily.     cetirizine 10 MG tablet  Commonly known as:  ZYRTEC  Take 10 mg by mouth every morning.     cholecalciferol 1000 UNITS  tablet  Commonly known as:  VITAMIN D  Take 1,000 Units by mouth at bedtime.     citalopram 20 MG tablet  Commonly known as:  CELEXA  Take 20 mg by mouth every morning.     Fish Oil 1000 MG Caps  Take 1 capsule by mouth at bedtime.     Fluticasone-Salmeterol 250-50 MCG/DOSE Aepb  Commonly known as:  ADVAIR  Inhale 1 puff into the lungs 2 (two) times daily.     furosemide 40 MG tablet  Commonly known as:  LASIX  Take 40 mg by mouth daily.     hydrALAZINE 25 MG tablet  Commonly known as:  APRESOLINE  Take 1 tablet by mouth 2 (two) times daily.     HYDROcodone-acetaminophen 10-325 MG per tablet  Commonly known as:  NORCO  Take 1 tablet by mouth every 4 (four) hours as needed for severe pain.     metoprolol 100 MG tablet  Commonly known as:  LOPRESSOR  Take 1 tablet by mouth daily.     nitroGLYCERIN 0.4 MG SL tablet  Commonly known as:  NITROSTAT  Place 0.4 mg under the tongue every 5 (five) minutes as needed for chest pain.     omeprazole 20 MG capsule  Commonly known as:  PRILOSEC  Take  20 mg by mouth daily.     potassium chloride 10 MEQ CR capsule  Commonly known as:  MICRO-K  Take 1 capsule by mouth 2 (two) times daily.     PROAIR HFA 108 (90 BASE) MCG/ACT inhaler  Generic drug:  albuterol  Inhale 2 puffs into the lungs every 6 (six) hours as needed for wheezing or shortness of breath.     SENTRY Tabs  Take 1 tablet by mouth every morning.     zolpidem 10 MG tablet  Commonly known as:  AMBIEN  Take 10 mg by mouth at bedtime as needed for sleep.           Follow-up Information    Follow up with Fuller Canada, MD.   Specialties:  Orthopedic Surgery, Radiology   Contact information:   [address] [place] [address] [phone]       Signed: Fuller Canada 03/26/2014, 1:37 PM

## 2014-03-26 NOTE — Anesthesia Postprocedure Evaluation (Signed)
  Anesthesia Post-op Note  Patient: Heather [name]  Procedure(s) Performed: Procedure(s): LEFT SHOULDER HEMI-ARTHROPLASTY (Left)  Patient Location: Room 305  Anesthesia Type:General  Level of Consciousness: awake, alert , oriented and patient cooperative  Airway and Oxygen Therapy: Patient Spontanous Breathing  Post-op Pain: mild  Post-op Assessment: Post-op Vital signs reviewed, Patient's Cardiovascular Status Stable, Respiratory Function Stable, Patent Airway, No signs of Nausea or vomiting and Pain level controlled  Post-op Vital Signs: Reviewed and stable  Last Vitals:  Filed Vitals:   03/26/14 0827  BP: 109/62  Pulse: 88  Temp:   Resp:     Complications: No apparent anesthesia complications

## 2014-03-26 NOTE — Clinical Social Work Psychosocial (Signed)
Clinical Social Work Department BRIEF PSYCHOSOCIAL ASSESSMENT 03/26/2014  Patient:  Heather [name], Heather [name]     Account Number:  [id number]     Admit date:  03/25/2014  Clinical Social Worker:  Wyatt Haste  Date/Time:  03/26/2014 12:26 PM  Referred by:  Physician  Date Referred:  03/26/2014 Referred for  SNF Placement   Other Referral:   Interview type:  Patient Other interview type:    PSYCHOSOCIAL DATA Living Status:  ALONE Admitted from facility:   Level of care:   Primary support name:   Primary support relationship to patient:   Degree of support available:   family lives out of town per pt    CURRENT CONCERNS Current Concerns  Post-Acute Placement   Other Concerns:    SOCIAL WORK ASSESSMENT / PLAN CSW met with pt at bedside. Pt alert and oriented and reports she lives alone in an apartment. Pt moved here from [place] in 2014 when she and her husband divorced. Her family all lives in [place]. Pt had shoulder surgery yesterday. She states she has had multiple joint replacements, but was able to go home for most of them with family support. She did go to rehab after knee replacement. Pt plans to return to [place] after she recovers. PT evaluated pt and recommendation is for SNF. CSW discussed placement process and she is agreeable. Aware of [employer] authorization process. Pt requests [place] if possible. At baseline, pt occasionally uses a cane. She is still driving. CSW sent out referral to SNF in [place] and notified [employer].   Assessment/plan status:  Psychosocial Support/Ongoing Assessment of Needs Other assessment/ plan:   Information/referral to community resources:   SNF list    PATIENT'S/FAMILY'S RESPONSE TO PLAN OF CARE: Pt very agreeable to short term SNF prior to return home as she lives alone and does not feel she can manage independently for awhile.       Benay Pike, [place]

## 2014-03-26 NOTE — Care Management Utilization Note (Signed)
UR completed 

## 2014-03-26 NOTE — Clinical Social Work Placement (Signed)
Clinical Social Work Department CLINICAL SOCIAL WORK PLACEMENT NOTE 03/26/2014  Patient:  Heather [name], Heather [name]  Account Number:  [id number] Admit date:  03/25/2014  Clinical Social Worker:  Derenda Fennel, LCSW  Date/time:  03/26/2014 12:25 PM  Clinical Social Work is seeking post-discharge placement for this patient at the following level of care:   SKILLED NURSING   (*CSW will update this form in Epic as items are completed)   03/26/2014  Patient/family provided with [facility] Department of Clinical Social Work's list of facilities offering this level of care within the geographic area requested by the patient (or if unable, by the patient's family).  03/26/2014  Patient/family informed of their freedom to choose among providers that offer the needed level of care, that participate in Medicare, Medicaid or managed care program needed by the patient, have an available bed and are willing to accept the patient.  03/26/2014  Patient/family informed of [facility]' ownership interest in [facility], as well as of the fact that they are under no obligation to receive care at this facility.  PASARR submitted to EDS on 03/26/2014 PASARR number received on 03/26/2014  FL2 transmitted to all facilities in geographic area requested by pt/family on  03/26/2014 FL2 transmitted to all facilities within larger geographic area on   Patient informed that his/her managed care company has contracts with or will negotiate with  certain facilities, including the following:     Patient/family informed of bed offers received:  03/26/2014 Patient chooses bed at [facility] Physician recommends and patient chooses bed at  [facility]  Patient to be transferred to [name] on  03/26/2014 Patient to be transferred to facility by RN Patient and family notified of transfer on 03/26/2014 Name of family member notified:  Marcelle Smiling- friend  The following physician request were  entered in Epic:   Additional Comments:  Derenda Fennel, [place] [phone]

## 2014-03-26 NOTE — Care Management Note (Signed)
    Page 1 of 1   03/26/2014     1:31:40 PM CARE MANAGEMENT NOTE 03/26/2014  Patient:  SHOMARI, KAUK   Account Number:  [id number]  Date Initiated:  03/26/2014  Documentation initiated by:  Kathyrn Sheriff  Subjective/Objective Assessment:   Pt admitted for s/p rotator cuff surgery. Pt is from home with self care. Pt has no support when she returns home. Pt plans to discharge to SNF per PT's recommendations. CSW aware of dishcarge plan and will arrange for placement. No CM need     Action/Plan:   Anticipated DC Date:  03/26/2014   Anticipated DC Plan:  SKILLED NURSING FACILITY  In-house referral  Clinical Social Worker      DC Planning Services  CM consult      Choice offered to / List presented to:             Status of service:  Completed, signed off Medicare Important Message given?   (If response is "NO", the following Medicare IM given date fields will be blank) Date Medicare IM given:   Medicare IM given by:   Date Additional Medicare IM given:   Additional Medicare IM given by:    Discharge Disposition:  SKILLED NURSING FACILITY  Per UR Regulation:    If discussed at Long Length of Stay Meetings, dates discussed:    Comments:  03/26/2014 1330 Kathyrn Sheriff, RN, MSN, [facility]

## 2014-03-26 NOTE — Progress Notes (Signed)
Pt discharged to [facility] per Dr. Romeo Apple. Pt's IV site D/C'd and WDL. Pt's VSS. Report called to Herbert Seta, nurse at [facility]. Verbalized understanding. Pt left floor via WC in stable condition accompanied NT.

## 2014-03-26 NOTE — Evaluation (Signed)
Occupational Therapy Evaluation Patient Details Name: Heather [name] MRN: [id number] DOB: 1940/03/07 Today's Date: 03/26/2014    History of Present Illness Heather [name] is a 74 y.o. female presents with onset of pain in the left shoulder x2 months associated with decreased range of motion. She complains of sharp throbbing 10 out of 10 constant pain which wakes her up at night. Patient underwent a left rotator cuff arthroscopy and is to wear a sling 24/7.    Clinical Impression   Pt in bed upon therapy arrival. Patient educated on sling donning/doffing, care, wearing schedule, and AROM exercises for hand and wrist. Precautions and proper LUE positioning reviewed. Patient verbalized understanding. Patient lives alone and does not have any assistance from friends or family. Recommend a short stay at SNF to increase ADL performance before discharging home.     Follow Up Recommendations  SNF    Equipment Recommendations  None recommended by OT       Precautions / Restrictions Precautions Precautions: Shoulder Shoulder Interventions: Shoulder sling/immobilizer;At all times;Off for dressing/bathing/exercises Precaution Comments: Educational handout given Restrictions Weight Bearing Restrictions: Yes LUE Weight Bearing: Non weight bearing              ADL Overall ADL's : Needs assistance/impaired         Upper Body Bathing: Total assistance;Bed level       Upper Body Dressing : Total assistance;Bed level                     General ADL Comments: Unable to transfer out of bed due to pain and grogginess. Patient will defintely need assistance with ADLs for a short period of time.                Pertinent Vitals/Pain Pain Assessment: 0-10 Pain Score: 9  Pain Descriptors / Indicators: Aching;Constant Pain Intervention(s): Patient requesting pain meds-RN notified     Hand Dominance Right   Extremity/Trunk Assessment Upper Extremity Assessment Upper  Extremity Assessment: LUE deficits/detail LUE Deficits / Details: recent rotator cuff arthroplasty LUE: Unable to fully assess due to immobilization;Unable to fully assess due to pain           Communication Communication Communication: No difficulties   Cognition Arousal/Alertness: Awake/alert Behavior During Therapy: WFL for tasks assessed/performed Overall Cognitive Status: Within Functional Limits for tasks assessed                           Shoulder Instructions Shoulder Instructions Donning/doffing shirt without moving shoulder: Maximal assistance Correct positioning of sling/immobilizer: Maximal assistance ROM for elbow, wrist and digits of operated UE: Independent Sling wearing schedule (on at all times/off for ADL's): Independent Proper positioning of operated UE when showering: Set-up Positioning of UE while sleeping: Set-up    Home Living Family/patient expects to be discharged to:: Other (Comment) (Patient lives alone. Needs assistance with ADLs. Recommend short rehab stay at SNF if insurance will cover.) Living Arrangements: Alone   Type of Home: Apartment Home Access: Level entry     Home Layout: One level     Bathroom Shower/Tub: Tub/shower unit         Home Equipment: Cane - quad;Shower seat;Grab bars - tub/shower;Hand held shower head          Prior Functioning/Environment Level of Independence: Independent  End of Session Nurse Communication: Patient requests pain meds  Activity Tolerance: Patient tolerated treatment well;Patient limited by fatigue;Patient limited by pain Patient left: in bed;with call bell/phone within reach;with nursing/sitter in room   Time: 0815-0835 OT Time Calculation (min): 20 min Charges:  OT General Charges $OT Visit: 1 Procedure OT Evaluation $Initial OT Evaluation Tier I: 1 Procedure (10') OT Treatments $Self Care/Home Management : 8-22 mins  (10') G-Codes: OT G-codes **NOT FOR INPATIENT CLASS** Functional Assessment Tool Used: clinical judgement Functional Limitation: Self care Self Care Current Status (F1102): At least 80 percent but less than 100 percent impaired, limited or restricted Self Care Goal Status (T1173): At least 80 percent but less than 100 percent impaired, limited or restricted Self Care Discharge Status [phone]): At least 80 percent but less than 100 percent impaired, limited or restricted  Heather [name], OTR/L,CBIS  [phone]  03/26/2014, 8:49 AM

## 2014-03-26 NOTE — Progress Notes (Signed)
  POD # 1 left hemi-arthroplasty for unrepairable rotator cuff with rotator cuff induced arthritis   VS BP 111/55 mmHg  Pulse 80  Temp(Src) 99.6 F (37.6 C) (Oral)  Resp 20  Ht 5\' 3"  (1.6 m)  Wt 197 lb (89.359 kg)  BMI 34.91 kg/m2  SpO2 97%   LABS none  DRESSING clean  Neuro-vasculo-motor status of operative limb is normal with intact deltoid sensation lateral portion of the arm in the ability to move the hand without any abnormalities  A/P the patient is postop day 1 she is doing well, she does not have any family with her and she would have to go home alone or if she can get social service to check her and if it's assisted living or nursing home placement. I will await their most important evaluation after occupational therapy assessment

## 2014-03-26 NOTE — Clinical Social Work Placement (Signed)
Clinical Social Work Department CLINICAL SOCIAL WORK PLACEMENT NOTE 03/26/2014  Patient:  Heather [name], Heather [name]  Account Number:  [id number] Admit date:  03/25/2014  Clinical Social Worker:  Derenda Fennel, LCSW  Date/time:  03/26/2014 12:25 PM  Clinical Social Work is seeking post-discharge placement for this patient at the following level of care:   SKILLED NURSING   (*CSW will update this form in Epic as items are completed)   03/26/2014  Patient/family provided with [facility] Department of Clinical Social Work's list of facilities offering this level of care within the geographic area requested by the patient (or if unable, by the patient's family).  03/26/2014  Patient/family informed of their freedom to choose among providers that offer the needed level of care, that participate in Medicare, Medicaid or managed care program needed by the patient, have an available bed and are willing to accept the patient.  03/26/2014  Patient/family informed of [facility]' ownership interest in [facility], as well as of the fact that they are under no obligation to receive care at this facility.  PASARR submitted to EDS on 03/26/2014 PASARR number received on 03/26/2014  FL2 transmitted to all facilities in geographic area requested by pt/family on  03/26/2014 FL2 transmitted to all facilities within larger geographic area on   Patient informed that his/her managed care company has contracts with or will negotiate with  certain facilities, including the following:     Patient/family informed of bed offers received:   Patient chooses bed at  Physician recommends and patient chooses bed at    Patient to be transferred to  on   Patient to be transferred to facility by  Patient and family notified of transfer on  Name of family member notified:    The following physician request were entered in Epic:   Additional Comments:  Derenda Fennel, LCSW [phone]

## 2014-03-27 ENCOUNTER — Encounter (HOSPITAL_COMMUNITY): Payer: Self-pay | Admitting: Orthopedic Surgery

## 2014-03-30 ENCOUNTER — Non-Acute Institutional Stay (SKILLED_NURSING_FACILITY): Payer: Commercial Managed Care - HMO | Admitting: Internal Medicine

## 2014-03-30 DIAGNOSIS — N179 Acute kidney failure, unspecified: Secondary | ICD-10-CM

## 2014-03-30 DIAGNOSIS — Z96612 Presence of left artificial shoulder joint: Secondary | ICD-10-CM | POA: Diagnosis not present

## 2014-03-30 DIAGNOSIS — F418 Other specified anxiety disorders: Secondary | ICD-10-CM | POA: Diagnosis not present

## 2014-03-30 DIAGNOSIS — J449 Chronic obstructive pulmonary disease, unspecified: Secondary | ICD-10-CM

## 2014-03-31 NOTE — Progress Notes (Addendum)
Patient ID: Heather [name], female   DOB: [birth year], 74 y.o.   MRN: [id number]               HISTORY & PHYSICAL  DATE:  03/30/2014                 FACILITY: [facility]               LEVEL OF CARE:   SNF   CHIEF COMPLAINT:  Admission to SNF, post stay at [facility], 03/25/2014 through 03/26/2014.    HISTORY OF PRESENT ILLNESS:  This patient was admitted electively for a left rotator cuff arthropathy.  She had a left shoulder hemiarthroplasty.  She was noted to have a repairable rotator cuff tear of the left shoulder with osteoarthritis of the glenohumeral joint.  She has very limited support at home and, therefore, she has been sent here for rehabilitation.     PAST MEDICAL HISTORY/PROBLEM LIST:                Hypertension.    COPD.     Gastroesophageal reflux disease.    Glaucoma.    Depression with anxiety.    Anemia.    Arthritis.    Gout.    PAST SURGICAL HISTORY:                        Bilateral hip replacements.    Right total knee replacement.    Shoulder arthroscopy on the right.    Abdominal hysterectomy.    Foot surgery on the left.    Bilateral bunionectomies.    Back surgery in the lumbar disc.    Left shoulder hemiarthroplasty on 03/25/2014.     CURRENT MEDICATIONS:  Medication list is reviewed.      Prednisone 10 mg, which has stopped.  I am not sure of the exact need for this.    Aspirin 81 q.d.    Calcium/magnesium/zinc 333/133/5, 1 capsule daily.    Zyrtec 10 q.d., allergic rhinitis.    Vitamin D 1000 U at bedtime.    Celexa 20 mg q.morning.    Fish oil 1 capsule at bedtime.    Advair 1 puff b.i.d.    Lasix 40 q.d.    Hydralazine 20 b.i.d.    Norco 10/325, 1 tablet q.4 hours p.r.n., severe pain.    Metoprolol 100 mg, 1 tablet daily.    Nitroglycerin 0.4 sublingual.  (Patient states she carries this, although has never reported chest pain.)   Prilosec 20 q.d.     KCl 10 mEq b.i.d.     ProAir 2 puffs q.6  p.r.n.     Ambien 10 mg q.h.s. p.r.n.      SOCIAL HISTORY:       HOUSING:  The patient lives in [place] independently.   FUNCTIONAL STATUS:  Independent with ADLs and IADLs.  States she occasionally uses a tripod cane when her replaced knee hurts.   TOBACCO USE:  She is an ex-smoker, quitting many years ago.    REVIEW OF SYSTEMS:                CHEST/RESPIRATORY:  No excessive shortness of breath or cough.   CARDIAC:   No chest pain.   GI:  No nausea and vomiting.  She has had normal bowel movements.   GU:  No dysuria.      PHYSICAL EXAMINATION:  GENERAL APPEARANCE:  The patient appears well.   CHEST/RESPIRATORY:  Clear air entry bilaterally.    CARDIOVASCULAR:  CARDIAC:   Heart sounds are normal.  There are no murmurs.   GASTROINTESTINAL:  ABDOMEN:   Soft.  Not distended.  No masses.   CIRCULATION:  EDEMA/VARICOSITIES:  Extremities:  No evidence of a DVT.  No edema.   NEUROLOGICAL:    BALANCE/GAIT:  I did not see her on her feet.   PSYCHIATRIC:   MENTAL STATUS:   Bright, alert, cheerful.    MUSCULOSKELETAL:   EXTREMITIES:   LEFT UPPER EXTREMITY:  Left shoulder:  The laparoscopic incisions have covering.  I did not remove these.  She has a shoulder brace on.  I did not remove this, either.   OTHER:  She appears to have decent range in her other joints.     ASSESSMENT/PLAN:               Left shoulder hemiarthroplasty.  She has no support at home for ADLs and IADLs.  Therefore, she is here.    COPD.  This does not appear to be unstable.    History of depression with anxiety.  This also appears quite stable.    Mild renal insufficiency.  Her BUN is 44, creatinine of 1.34.  She is on Lasix 40 mg, I believe for lower extremity edema.  The edema may be medication-induced (hydralazine).  I will reduce her Lasix to 20 mg and follow up on this.    The patient is here for rehabilitation of her left shoulder.  Her stay here should be short.

## 2014-04-01 ENCOUNTER — Telehealth: Payer: Self-pay | Admitting: *Deleted

## 2014-04-01 ENCOUNTER — Non-Acute Institutional Stay (SKILLED_NURSING_FACILITY): Payer: Commercial Managed Care - HMO | Admitting: Internal Medicine

## 2014-04-01 ENCOUNTER — Encounter: Payer: Self-pay | Admitting: Internal Medicine

## 2014-04-01 ENCOUNTER — Other Ambulatory Visit: Payer: Self-pay | Admitting: Orthopedic Surgery

## 2014-04-01 DIAGNOSIS — N289 Disorder of kidney and ureter, unspecified: Secondary | ICD-10-CM | POA: Diagnosis not present

## 2014-04-01 DIAGNOSIS — R040 Epistaxis: Secondary | ICD-10-CM | POA: Insufficient documentation

## 2014-04-01 DIAGNOSIS — M12512 Traumatic arthropathy, left shoulder: Secondary | ICD-10-CM

## 2014-04-01 DIAGNOSIS — Z96612 Presence of left artificial shoulder joint: Secondary | ICD-10-CM

## 2014-04-01 DIAGNOSIS — M12812 Other specific arthropathies, not elsewhere classified, left shoulder: Secondary | ICD-10-CM

## 2014-04-01 NOTE — Progress Notes (Signed)
Patient ID: Heather [name], female   DOB: [birth year], 74 y.o.   MRN: [id number]   FACILITY: [facility]               LEVEL OF CARE:   SNF  This is an acute visit   CHIEF COMPLAINT:  Acute visit secondary to nosebleed.    HISTORY OF PRESENT ILLNESS:  This patient was admitted electively for a left rotator cuff arthropathy.  She had a left shoulder hemiarthroplasty.  She was noted to have a repairable rotator cuff tear of the left shoulder with osteoarthritis of the glenohumeral joint.  She has very limited support at home and, therefore, she has been sent here for rehabilitation Her stay here has been relatively uneventful nursing staff has noted a nosebleed the last couple days apparently this is of short duration and resolves with compression.  She does not report any weakness dizziness or trauma to her nose-vital signs appear to be stable.  I note she is on aspirin for anticoagulation.     PAST MEDICAL HISTORY/PROBLEM LIST:                Hypertension.    COPD.     Gastroesophageal reflux disease.    Glaucoma.    Depression with anxiety.    Anemia.    Arthritis.    Gout.    PAST SURGICAL HISTORY:                        Bilateral hip replacements.    Right total knee replacement.    Shoulder arthroscopy on the right.    Abdominal hysterectomy.    Foot surgery on the left.    Bilateral bunionectomies.    Back surgery in the lumbar disc.    Left shoulder hemiarthroplasty on 03/25/2014.     CURRENT MEDICATIONS:  Medication list is reviewed.      Prednisone 10 mg, which has stopped.  I am not sure of the exact need for this.    Aspirin 81 q.d.    Calcium/magnesium/zinc 333/133/5, 1 capsule daily.    Zyrtec 10 q.d., allergic rhinitis.    Vitamin D 1000 U at bedtime.    Celexa 20 mg q.morning.    Fish oil 1 capsule at bedtime.    Advair 1 puff b.i.d.    Lasix 20 q.d.    Hydralazine 20 b.i.d.    Norco 10/325, 1 tablet q.4 hours p.r.n.,  severe pain.    Metoprolol 100 mg, 1 tablet daily.    Nitroglycerin 0.4 sublingual.  (Patient states she carries this, although has never reported chest pain.)   Prilosec 20 q.d.     KCl 10 mEq b.i.d.     ProAir 2 puffs q.6 p.r.n.     Ambien 10 mg q.h.s. p.r.n.      SOCIAL HISTORY:       HOUSING:  The patient lives in [place] independently.   FUNCTIONAL STATUS:  Independent with ADLs and IADLs.  States she occasionally uses a tripod cane when her replaced knee hurts.   TOBACCO USE:  She is an ex-smoker, quitting many years ago.    REVIEW OF SYSTEMS: General does not complaining any fever or chills.  Skin does not report any increased rashes or bruising.  Head ears eyes nose mouth and throat-does not complain of visual changes apparently has had a couple episodes of a nosebleed that fairly quickly resolved  CHEST/RESPIRATORY:  No excessive shortness of breath or cough.   CARDIAC:   No chest pain. Some chronic lower extremity edema   GI:  No nausea and vomiting.  She has had normal bowel movements.   GU:  No dysuria.      PHYSICAL EXAMINATION: Temperature 99.6 pulse 74 respirations 18 blood pressure 111/54.--Most recent weight is 197.8 this is down a couple pounds from the weight on January 22                GENERAL APPEARANCE:  The patient appears well. Head ears eyes nose mouth and throat-sclera and conjunctivae are clear visual acuity appears grossly intact oropharynx clear mucous membranes moist-did note some dried blood clots in turbinates bilaterally there is no active bleeding at this time   CHEST/RESPIRATORY:  Clear air entry bilaterally.    CARDIOVASCULAR:  CARDIAC:   Heart sounds are normal.  There are no murmurs.   GASTROINTESTINAL:  ABDOMEN:   Soft.  Not distended.  No masses.   CIRCULATION:  EDEMA/VARICOSITIES:  Extremities:  No evidence of a DVT.  appears to be chronic edema   NEUROLOGICAL:     No lateralizing findings.   PSYCHIATRIC:     MENTAL STATUS:   Bright, alert, cheerful.    MUSCULOSKELETAL:   EXTREMITIES:   LEFT UPPER EXTREMITY:  Left shoulder:  Surgical site Staples are in place there is no surrounding edema erythema or drainage this appears to be healing fairly unremarkably-arm is in a sling there is a positive radial pulse grip strength is intact.    Labs.  03/27/2014.  WBC 6.7 hemoglobin 9.4 platelets 179.  Sodium 139 potassium 3.8 BUN 44 creatinine 1.34.  I did compare this to previous labs areJan 19th at that point creatinine was 1.19-hemoglobin was 11.1 platelets 244.--These labs were prior to surgery       ASSESSMENT/PLAN  Nosebleed-at this point appears to be stable this may be due to the dry air-he has quickly resolved-will check blood work tomorrow however including a CBC with platelets PT and INR-I do note her hemoglobin had dropped somewhat on most recent lab however this is a postsurgical hemoglobin-:               Left shoulder hemiarthroplasty.  She has no support at home for ADLs and IADLs.  Therefore, she is here.  This appears to be stable per exam today  COPD.  This does not appear to be unstable.    History of depression with anxiety.  This also appears quite stable.    Mild renal insufficiency.  Her BUN is 44, creatinine of 1.34.  She was on Lasix 40 mg, I believe for lower extremity edema.  The edema may be medication-induced (hydralazine).  Dr. Leanord Hawking reduce this to 20 mg a day update BMP has been ordered for next week  Her weight has trended down a couple pounds which would indicate she is not retaining excessive fluid but this will have to be monitored.   [name]     CPT CODE: 16109    --of note greater than 25 minutes spent assessing patient-discussing her status with nursing staff-reviewing her chart-and coordinating and formulating a plan of care-of note greater than 50% of time spent coordinating plan of care

## 2014-04-01 NOTE — Telephone Encounter (Signed)
done

## 2014-04-01 NOTE — Telephone Encounter (Signed)
Physical therapist Annice Pih would like to know if you have a certain protocol for the hemi-arthroplasty

## 2014-04-01 NOTE — Telephone Encounter (Signed)
I printed the order on the front printer fax it to the therapist please thanks

## 2014-04-02 ENCOUNTER — Other Ambulatory Visit: Payer: Self-pay | Admitting: *Deleted

## 2014-04-02 MED ORDER — HYDROCODONE-ACETAMINOPHEN 10-325 MG PO TABS: 1.0000 | ORAL_TABLET | ORAL | Status: DC | PRN

## 2014-04-02 NOTE — Telephone Encounter (Signed)
[facility] 

## 2014-04-03 DIAGNOSIS — J449 Chronic obstructive pulmonary disease, unspecified: Secondary | ICD-10-CM | POA: Diagnosis not present

## 2014-04-03 DIAGNOSIS — I10 Essential (primary) hypertension: Secondary | ICD-10-CM | POA: Diagnosis not present

## 2014-04-04 ENCOUNTER — Encounter: Payer: Self-pay | Admitting: Internal Medicine

## 2014-04-04 NOTE — Progress Notes (Signed)
This encounter was created in error - please disregard.

## 2014-04-06 ENCOUNTER — Encounter: Payer: Self-pay | Admitting: Orthopedic Surgery

## 2014-04-06 ENCOUNTER — Ambulatory Visit: Payer: Medicare HMO | Admitting: Orthopedic Surgery

## 2014-04-06 ENCOUNTER — Non-Acute Institutional Stay (SKILLED_NURSING_FACILITY): Payer: Commercial Managed Care - HMO | Admitting: Internal Medicine

## 2014-04-06 DIAGNOSIS — J449 Chronic obstructive pulmonary disease, unspecified: Secondary | ICD-10-CM

## 2014-04-07 ENCOUNTER — Ambulatory Visit (INDEPENDENT_AMBULATORY_CARE_PROVIDER_SITE_OTHER): Payer: Self-pay | Admitting: Orthopedic Surgery

## 2014-04-07 VITALS — Ht 63.0 in | Wt 197.0 lb

## 2014-04-07 DIAGNOSIS — M75102 Unspecified rotator cuff tear or rupture of left shoulder, not specified as traumatic: Secondary | ICD-10-CM

## 2014-04-07 DIAGNOSIS — Z96612 Presence of left artificial shoulder joint: Secondary | ICD-10-CM

## 2014-04-07 NOTE — Progress Notes (Signed)
Patient ID: Heather [name], female   DOB: [birth year], 74 y.o.   MRN: [id number] Chief Complaint  Patient presents with  . Follow-up    post op 1, Left shoulder hemi-arthroplasty, DOS 03/25/13    Ht 5\' 3"  (1.6 m)  Wt 197 lb (89.359 kg)  BMI 34.91 kg/m2   First postop visit postop day #13. Staples removed. Wound looks clean. Patient immobilizer. Patient at rehabilitation secondary to living alone.  Follow-up 4 weeks x-ray AP lateral left shoulder  The size 12 humerus was placed and a second trial reduction was performed with the trial 48 x 18 mm head. No change in the range of motion or stability of the shoulder was noted at this implant and therefore a 48 x 18 mm metal head was placed.  PRE-OPERATIVE DIAGNOSIS:  Rotator cuff tear unrepairable rotator cuff tear with glenohumeral arthritis  POST-OPERATIVE DIAGNOSIS:  Rotator cuff tear unrepairable rotator cuff tear with glenohumeral arthritis  depuy shoulder size 12 with a 48 x18 head   PROCEDURE:  Procedure(s): LEFT SHOULDER HEMI-ARTHROPLASTY (Left) IMPRESSION: Complete supraspinatus tendon tear with 4-5 cm of tendon retraction and fatty atrophy of the supraspinatus. The humeral head is high-riding secondary to the tear.   Bulky acromioclavicular osteoarthritis.   Mild to moderate glenohumeral osteoarthritis.   Os acromiale.

## 2014-04-07 NOTE — Addendum Note (Signed)
Addended by: Adella Hare B on: 04/07/2014 03:27 PM   Modules accepted: Level of Service

## 2014-04-08 ENCOUNTER — Other Ambulatory Visit: Payer: Self-pay | Admitting: *Deleted

## 2014-04-08 MED ORDER — OXYCODONE HCL 5 MG PO TABS: ORAL_TABLET | ORAL | Status: DC

## 2014-04-08 NOTE — Telephone Encounter (Signed)
[facility] 

## 2014-04-08 NOTE — Progress Notes (Addendum)
Patient ID: Heather [name], female   DOB: 1941-02-23, 74 y.o.   MRN: [id number]               PROGRESS NOTE  DATE:  04/06/2014                       FACILITY: [facility]                     LEVEL OF CARE:   SNF   Acute Visit   CHIEF COMPLAINT:  Follow up edema.     HISTORY OF PRESENT ILLNESS:  This is a 74 year-old lady who came to [place] after an elective left total shoulder replacement.    At the time she was admitted here, she was on Lasix 40 mg.  I found this to be a high dose.  I reduced it to 20.  However, she has gone on to complain of lower extremity edema.  I am seeing her today because of this.    PAST MEDICAL HISTORY/PROBLEM LIST:       Hypertension.    COPD.    Gastroesophageal reflux.    Depression with anxiety.    Glaucoma.    Anemia.    Arthritis.    Gout.    REVIEW OF SYSTEMS:                  CHEST/RESPIRATORY:  No shortness of breath.  CARDIAC:   No chest pain.   I cannot see that she has had any cardiac issues.    PHYSICAL EXAMINATION:                       GENERAL APPEARANCE:  The patient appears well.     CHEST/RESPIRATORY:  Clear air entry bilaterally.     CARDIOVASCULAR:  CARDIAC:   Heart sounds are normal.  There are no murmurs.   Her JVP, however, is borderline elevated at 45.   EDEMA/VARICOSITIES:  Extremities:  She has some trivial pedal edema, but there is no evidence of a DVT.  I do not see a major issue here.    ASSESSMENT/PLAN:                        Bilateral lower extremity edema.  This is mild.  She was on Lasix 40 mg a day.  I will increase this.   She does have COPD.  I suppose it is possible that she has some degree of pulmonary hypertension.  She also came to [place] on prednisone.  The prednisone appears to have stopped.        I do not see any other major issues here.

## 2014-04-14 ENCOUNTER — Encounter: Payer: Self-pay | Admitting: Internal Medicine

## 2014-04-14 ENCOUNTER — Non-Acute Institutional Stay (SKILLED_NURSING_FACILITY): Payer: Commercial Managed Care - HMO | Admitting: Internal Medicine

## 2014-04-14 DIAGNOSIS — N289 Disorder of kidney and ureter, unspecified: Secondary | ICD-10-CM | POA: Diagnosis not present

## 2014-04-14 DIAGNOSIS — R609 Edema, unspecified: Secondary | ICD-10-CM

## 2014-04-14 DIAGNOSIS — H04123 Dry eye syndrome of bilateral lacrimal glands: Secondary | ICD-10-CM

## 2014-04-14 HISTORY — DX: Dry eye syndrome of bilateral lacrimal glands: H04.123

## 2014-04-14 NOTE — Progress Notes (Signed)
Patient ID: Heather [name], female   DOB: 08/08/1940, 74 y.o.   MRN: [id number]   FACILITY: [facility]               LEVEL OF CARE:   SNF  This is an acute visit   CHIEF COMPLAINT:  Acute visit secondary to irritated eyes.    HISTORY OF PRESENT ILLNESS:  This patient is a pleasant 74 year old female who was admitted electively for a left rotator cuff arthropathy.  She had a left shoulder hemiarthroplasty.  She was noted to have a repairable rotator cuff tear of the left shoulder with osteoarthritis of the glenohumeral joint.  She has very limited support at home and, therefore, she has been sent here for rehabilitation Her stay here has been relatively uneventful --I saw her a couple weeks ago for nosebleed which was of short duration and this has resolved.  She has been complaining of some irritated "junky-feeling eyes-she says this is not really new and she was on home moisturizing eyedrop at home that apparently provided relief.  She does not complain of any visual changes she does have prescription lenses.  I do know recently her Lasix was reduced secondary to renal insufficiency apparently she is on Lasix for a history of edema-her creatinine did improve from 1.34 down to 0.95 on February 1--I suspect secondary to concerns about her edema Dr. Leanord Hawking increased her Lasix back to 40 mg a day--her weight appears to be stable at around 197   She does not complain of any chest pain or shortness of breath.   [name]     PAST MEDICAL HISTORY/PROBLEM LIST:                Hypertension.    COPD.     Gastroesophageal reflux disease.    Glaucoma.    Depression with anxiety.    Anemia.    Arthritis.    Gout.    PAST SURGICAL HISTORY:                        Bilateral hip replacements.    Right total knee replacement.    Shoulder arthroscopy on the right.    Abdominal hysterectomy.    Foot surgery on the left.    Bilateral bunionectomies.    Back surgery in the lumbar  disc.    Left shoulder hemiarthroplasty on 03/25/2014.     CURRENT MEDICATIONS:  Medication list is reviewed.      Prednisone 10 mg, which has stopped.  I am not sure of the exact need for this.    Aspirin 81 q.d.    Calcium/magnesium/zinc 333/133/5, 1 capsule daily.    Zyrtec 10 q.d., allergic rhinitis.    Vitamin D 1000 U at bedtime.    Celexa 20 mg q.morning.    Fish oil 1 capsule at bedtime.    Advair 1 puff b.i.d.    Lasix 40 q.d.    Hydralazine 20 b.i.d.    Norco 10/325, 1 tablet q.4 hours p.r.n., severe pain.    Metoprolol 100 mg, 1 tablet daily.    Nitroglycerin 0.4 sublingual.  (Patient states she carries this, although has never reported chest pain.)   Prilosec 20 q.d.     KCl 10 mEq b.i.d.     ProAir 2 puffs q.6 p.r.n.     Ambien 10 mg q.h.s. p.r.n.      SOCIAL HISTORY:       HOUSING:  The patient  lives in [place] independently.   FUNCTIONAL STATUS:  Independent with ADLs and IADLs.  States she occasionally uses a tripod cane when her replaced knee hurts.   TOBACCO USE:  She is an ex-smoker, quitting many years ago.    REVIEW OF SYSTEMS: General does not complaining any fever or chills.  Skin does not report any increased rashes or bruising.  Head ears eyes nose mouth and throat-does not complain of visual changes eyes feel irritated as noted above-there has been no further episodes of nosebleeds to my knowledge              CHEST/RESPIRATORY:  No excessive shortness of breath or cough.   CARDIAC:   No chest pain. Some chronic lower extremity edema   GI:  No nausea and vomiting.  She has had normal bowel movements.   GU:  No dysuria.      PHYSICAL EXAMINATION:  T- 97.9 pulse 68 respirations 18 blood pressure 123/60 weight is stable at 197.8                GENERAL APPEARANCE:  The patient appears well-sitting comfortably in her wheelchair. Head ears eyes nose mouth and throat-- Her sclera and conjunctiva are relatively clear do appear to be  somewhat irritated but this does not appear to be an acute conjunctivitis visual acuity appears grossly intact--there is no exudate  oropharynx clear mucous membranes moist-  CHEST/RESPIRATORY:  Clear air entry bilaterally.    CARDIOVASCULAR:  CARDIAC:   Heart sounds are normal.  There are no murmurs.   GASTROINTESTINAL:  ABDOMEN:   Soft.  Not distended.  No masses.   CIRCULATION:  EDEMA/VARICOSITIES:  Extremities:  No evidence of a DVT.  appears to be chronic edema this appears to be stable with previous exam   NEUROLOGICAL:     No lateralizing findings.   PSYCHIATRIC:   MENTAL STATUS:   Bright, alert, cheerful.    MUSCULOSKELETAL:   EXTREMITIES:    Has her left upper extremity in a sling radial pulse intact   Labs.  04/06/2014.  WBC 5.6 hemoglobin 10.2 platelets 322.  Sodium 140 potassium 3.8 BUN 23 creatinine 0.95  03/27/2014.  WBC 6.7 hemoglobin 9.4 platelets 179.  Sodium 139 potassium 3.8 BUN 44 creatinine 1.34.  I did compare this to previous labs areJan 19th at that point creatinine was 1.19-hemoglobin was 11.1 platelets 244.--These labs were prior to surgery       ASSESSMENT/PLAN   Dry eyes-again this does not appear to be acute conjunctivitis will prescribed artificial tears when necessary and monitor.    :               Left shoulder hemiarthroplasty.  She has no support at home for ADLs and IADLs.  Therefore, she is here. She apparently is doing well with this   COPD.  This does not appear to be unstable--no complaints of shortness of breath or cough.    History of depression with anxiety.  This also appears quite stable.    Mild renal insufficiency.  Her  Most recent BUN is23 creatinine 0.95.  She was on Lasix 40 mg--this was reduced to 20 mg a day secondary to renal issues-she is now back on 40 mg a day will recheck a metabolic panel-  Edema this appears stable her weight has been stable per chart review at this point will monitor-again her Lasix has  been increased,    [id number]--of note greater than 25 minutes spent assessing patient reviewing  her chart-and coordinating plan of care for numerous diagnoses-of note greater than 50% of time spent coordinating plan of care      .   [name]

## 2014-04-15 ENCOUNTER — Other Ambulatory Visit (HOSPITAL_COMMUNITY)
Admission: RE | Admit: 2014-04-15 | Discharge: 2014-04-15 | Disposition: A | Discharge status: Home / Self Care | Payer: Commercial Managed Care - HMO | Source: Ambulatory Visit | Attending: Internal Medicine | Admitting: Internal Medicine

## 2014-04-15 ENCOUNTER — Encounter (HOSPITAL_COMMUNITY)
Admission: RE | Admit: 2014-04-15 | Discharge: 2014-04-15 | Disposition: A | Discharge status: Home / Self Care | Payer: Commercial Managed Care - HMO | Source: Skilled Nursing Facility | Attending: Internal Medicine | Admitting: Internal Medicine

## 2014-04-15 LAB — BASIC METABOLIC PANEL
Anion gap: 6 (ref 5–15)
BUN: 23 mg/dL (ref 6–23)
CHLORIDE: 105 mmol/L (ref 96–112)
CO2: 30 mmol/L (ref 19–32)
CREATININE: 0.92 mg/dL (ref 0.50–1.10)
Calcium: 9.1 mg/dL (ref 8.4–10.5)
GFR, EST AFRICAN AMERICAN: 70 mL/min — AB (ref >90)
GFR, EST NON AFRICAN AMERICAN: 61 mL/min — AB (ref >90)
Glucose, Bld: 90 mg/dL (ref 70–99)
Potassium: 3.6 mmol/L (ref 3.5–5.1)
Sodium: 141 mmol/L (ref 135–145)

## 2014-04-17 ENCOUNTER — Encounter: Payer: Self-pay | Admitting: Internal Medicine

## 2014-04-17 ENCOUNTER — Non-Acute Institutional Stay (SKILLED_NURSING_FACILITY): Payer: Commercial Managed Care - HMO | Admitting: Internal Medicine

## 2014-04-17 DIAGNOSIS — I10 Essential (primary) hypertension: Secondary | ICD-10-CM | POA: Diagnosis not present

## 2014-04-17 DIAGNOSIS — M12819 Other specific arthropathies, not elsewhere classified, unspecified shoulder: Secondary | ICD-10-CM

## 2014-04-17 DIAGNOSIS — J410 Simple chronic bronchitis: Secondary | ICD-10-CM

## 2014-04-17 DIAGNOSIS — N289 Disorder of kidney and ureter, unspecified: Secondary | ICD-10-CM | POA: Diagnosis not present

## 2014-04-17 DIAGNOSIS — M12519 Traumatic arthropathy, unspecified shoulder: Secondary | ICD-10-CM | POA: Diagnosis not present

## 2014-04-17 NOTE — Progress Notes (Signed)
Patient ID: Heather [name], female   DOB: Nov 13, 1940, 74 y.o.   MRN: [id number]   FACILITY: [facility]               LEVEL OF CARE:   SNF  This is a discharge note   CHIEF COMPLAINT:  Discharge note    HISTORY OF PRESENT ILLNESS:  This patient is a pleasant 74 year old female who was admitted electively for a left rotator cuff arthropathy.  She had a left shoulder hemiarthroplasty.  She was noted to have a repairable rotator cuff tear of the left shoulder with osteoarthritis of the glenohumeral joint.  She has very limited support at home and, therefore, she has been sent here for rehabilitation Her stay here has been relatively uneventful --I saw her a couple weeks ago for nosebleed which was of short duration and this has resolved.  She has been complaining of some irritated "junky-feeling eyes-she says this is not really new and she was on home moisturizing eyedrop at home that apparently provided relief.---she has been started on artificial tears  S.  I do know recently her Lasix was reduced secondary to renal insufficiency apparently she is on Lasix for a history of edema-her creatinine did improve from 1.34 down to 0.95 on February 1--I suspect secondary to concerns about her edema Dr. Leanord Hawking increased her Lasix back to 40 mg a day--her weight appears to be stable at around 197   She does not complain of any chest pain or shortness of breath.  She will be going home she will need PT and OT for further strengthening --the oxycodone when necessary appears effective for pain   .     PAST MEDICAL HISTORY/PROBLEM LIST:                Hypertension.    COPD.     Gastroesophageal reflux disease.    Glaucoma.    Depression with anxiety.    Anemia.    Arthritis.    Gout.    PAST SURGICAL HISTORY:                        Bilateral hip replacements.    Right total knee replacement.    Shoulder arthroscopy on the right.    Abdominal hysterectomy.    Foot  surgery on the left.    Bilateral bunionectomies.    Back surgery in the lumbar disc.    Left shoulder hemiarthroplasty on 03/25/2014.     CURRENT MEDICATIONS:  Medication list is reviewed.      Prednisone 10 mg, which has stopped.  .    Aspirin 81 q.d.    Calcium/magnesium/zinc 333/133/5, 1 capsule daily.    Zyrtec 10 q.d., allergic rhinitis.    Vitamin D 1000 U at bedtime.    Celexa 20 mg q.morning.    Fish oil 1 capsule at bedtime.    Advair 1 puff b.i.d.    Lasix 40 q.d.    Hydralazine 20 b.i.d.    Norco 10/325, 1 tablet q.4 hours p.r.n., severe pain.    Metoprolol 100 mg, 1 tablet daily.    Nitroglycerin 0.4 sublingual.  (Patient states she carries this, although has never reported chest pain.)   Prilosec 20 q.d.     KCl 10 mEq b.i.d.     ProAir 2 puffs q.6 p.r.n.     Ambien 10 mg q.h.s. p.r.n.      SOCIAL HISTORY:  HOUSING:  The patient lives in [place] independently.   FUNCTIONAL STATUS:  Independent with ADLs and IADLs.  States she occasionally uses a tripod cane when her replaced knee hurts.   TOBACCO USE:  She is an ex-smoker, quitting many years ago.    REVIEW OF SYSTEMS: General does not complaining any fever or chills.   Skin does not report any increased rashes or bruising.  Head ears eyes nose mouth and throat-does not complain of visual changes eyes --at times eyes will feel irritated-there has been no further episodes of nosebleeds to my knowledge              CHEST/RESPIRATORY:  No excessive shortness of breath or cough.   CARDIAC:   No chest pain. Some chronic lower extremity edema   GI:  No nausea and vomiting.  She has had normal bowel movements.   GU:  No dysuria.  Musculoskeletal-shoulder pain is controlled on the oxycodone otherwise does not complain of joint pain.  Neurologic no complaint of dizziness headache or numbness.  Psych some history of insomnia she is receiving zolpidem apparently with relief     PHYSICAL  EXAMINATION:  Temperature 98.1 pulse 62 respirations 20 blood pressure 111/68                GENERAL APPEARANCE:  The patient appears well-sign of distress Head ears eyes nose mouth and throat-- Her sclera and conjunctiva are relatively clear  visual acuity appears grossly intact--there is no exudate  oropharynx clear mucous membranes moist-  CHEST/RESPIRATORY:  Clear air entry bilaterally.--Labored breathing    CARDIOVASCULAR:  CARDIAC:   Heart sounds are normal.  There are no murmurs.   GASTROINTESTINAL:  ABDOMEN:   Soft.  Not distended.  No masses.   CIRCULATION:  EDEMA/VARICOSITIES:  Extremities:  No evidence of a DVT.  appears to be chronic edema this appears to be stable with previous exam --pedal pulses are palpable although somewhat reduced I suspect secondary to the chronic edema  Muscle skeletal-have her left arm in a sling surgical site left shoulder there is dry crusting a couple Steri-Strips are still in place there is no surrounding erythema or exudate no sign of infection  Moves all her extremities otherwise it appears at baseline-ambulates with a cane NEUROLOGICAL:     No lateralizing findings.   PSYCHIATRIC:   MENTAL STATUS:   Bright, alert, cheerful.        Labs  04/15/2014.  Sodium 141 potassium 3.6 BUN 23 creatinine 0.92.  04/06/2014.  WBC 5.6 hemoglobin 10.2 platelets 322.  Sodium 140 potassium 3.8 BUN 23 creatinine 0.95  03/27/2014.  WBC 6.7 hemoglobin 9.4 platelets 179.  Sodium 139 potassium 3.8 BUN 44 creatinine 1.34.  I did compare this to previous labs areJan 19th at that point creatinine was 1.19-hemoglobin was 11.1 platelets 244.--These labs were prior to surgery       ASSESSMENT/PLAN   Dry eyes-again this does not appear to be acute conjunctivitis will prescribed artificial tears when necessary and monitor.    :               Left shoulder hemiarthroplasty.    appears to have done well with this-she will need continued PT --OT  home   COPD.  This does not appear to be unstable--no complaints of shortness of breath or cough.--Continues on Advair as well as pro-air when necessary    History of depression with anxiety.  This also appears quite stable.    Mild renal  insufficiency.  Her  Most recent BUN is23 creatinine 0.95.  She was on Lasix 40 mg--this was reduced to 20 mg a day secondary to renal issues-she is now back on 40 mg a day --recent metabolic panel shows stability-    Hypertension this appears stable current medications including hydralazine 25 mg twice a day-Lopressor 100 mg daily.--Recent blood pressures 111/68-123/60-this appears to be relatively baseline  Insomnia this appears stable onZolpidem.    History of allergic rhinitis she is on Zyrtec apparently has been on this long-term  , History of irritated eyes-I did write an order for artificial tears earlier this week she says she has not received this yet-however since he is going home tomorrow she does have drops at home which have been effective   Will be going home she will need continued PT and OT for strengthening and rehabilitation but appears to be stable--she will be by herself but has strong friends support [employer] has done well during her stay here with  few complications  [id number]-of note greater than 30 minutes spent on this discharge summary--        .   [name]

## 2014-04-20 DIAGNOSIS — J449 Chronic obstructive pulmonary disease, unspecified: Secondary | ICD-10-CM | POA: Diagnosis not present

## 2014-04-20 DIAGNOSIS — I10 Essential (primary) hypertension: Secondary | ICD-10-CM | POA: Diagnosis not present

## 2014-04-21 DIAGNOSIS — Z471 Aftercare following joint replacement surgery: Secondary | ICD-10-CM | POA: Diagnosis not present

## 2014-04-21 DIAGNOSIS — M150 Primary generalized (osteo)arthritis: Secondary | ICD-10-CM | POA: Diagnosis not present

## 2014-04-21 DIAGNOSIS — J449 Chronic obstructive pulmonary disease, unspecified: Secondary | ICD-10-CM | POA: Diagnosis not present

## 2014-04-21 DIAGNOSIS — M810 Age-related osteoporosis without current pathological fracture: Secondary | ICD-10-CM | POA: Diagnosis not present

## 2014-04-21 DIAGNOSIS — D649 Anemia, unspecified: Secondary | ICD-10-CM | POA: Diagnosis not present

## 2014-04-21 DIAGNOSIS — F419 Anxiety disorder, unspecified: Secondary | ICD-10-CM | POA: Diagnosis not present

## 2014-04-21 DIAGNOSIS — F329 Major depressive disorder, single episode, unspecified: Secondary | ICD-10-CM | POA: Diagnosis not present

## 2014-04-21 DIAGNOSIS — I10 Essential (primary) hypertension: Secondary | ICD-10-CM | POA: Diagnosis not present

## 2014-04-21 DIAGNOSIS — K219 Gastro-esophageal reflux disease without esophagitis: Secondary | ICD-10-CM | POA: Diagnosis not present

## 2014-04-22 DIAGNOSIS — M150 Primary generalized (osteo)arthritis: Secondary | ICD-10-CM | POA: Diagnosis not present

## 2014-04-22 DIAGNOSIS — M810 Age-related osteoporosis without current pathological fracture: Secondary | ICD-10-CM | POA: Diagnosis not present

## 2014-04-22 DIAGNOSIS — I10 Essential (primary) hypertension: Secondary | ICD-10-CM | POA: Diagnosis not present

## 2014-04-22 DIAGNOSIS — Z471 Aftercare following joint replacement surgery: Secondary | ICD-10-CM | POA: Diagnosis not present

## 2014-04-22 DIAGNOSIS — F419 Anxiety disorder, unspecified: Secondary | ICD-10-CM | POA: Diagnosis not present

## 2014-04-22 DIAGNOSIS — J449 Chronic obstructive pulmonary disease, unspecified: Secondary | ICD-10-CM | POA: Diagnosis not present

## 2014-04-22 DIAGNOSIS — K219 Gastro-esophageal reflux disease without esophagitis: Secondary | ICD-10-CM | POA: Diagnosis not present

## 2014-04-22 DIAGNOSIS — F329 Major depressive disorder, single episode, unspecified: Secondary | ICD-10-CM | POA: Diagnosis not present

## 2014-04-22 DIAGNOSIS — D649 Anemia, unspecified: Secondary | ICD-10-CM | POA: Diagnosis not present

## 2014-04-24 DIAGNOSIS — J449 Chronic obstructive pulmonary disease, unspecified: Secondary | ICD-10-CM | POA: Diagnosis not present

## 2014-04-24 DIAGNOSIS — M79671 Pain in right foot: Secondary | ICD-10-CM | POA: Diagnosis not present

## 2014-04-24 DIAGNOSIS — I10 Essential (primary) hypertension: Secondary | ICD-10-CM | POA: Diagnosis not present

## 2014-04-27 ENCOUNTER — Telehealth: Payer: Self-pay | Admitting: Orthopedic Surgery

## 2014-04-27 ENCOUNTER — Other Ambulatory Visit: Payer: Self-pay | Admitting: *Deleted

## 2014-04-27 DIAGNOSIS — J449 Chronic obstructive pulmonary disease, unspecified: Secondary | ICD-10-CM | POA: Diagnosis not present

## 2014-04-27 DIAGNOSIS — I10 Essential (primary) hypertension: Secondary | ICD-10-CM | POA: Diagnosis not present

## 2014-04-27 DIAGNOSIS — D649 Anemia, unspecified: Secondary | ICD-10-CM | POA: Diagnosis not present

## 2014-04-27 DIAGNOSIS — F419 Anxiety disorder, unspecified: Secondary | ICD-10-CM | POA: Diagnosis not present

## 2014-04-27 DIAGNOSIS — K219 Gastro-esophageal reflux disease without esophagitis: Secondary | ICD-10-CM | POA: Diagnosis not present

## 2014-04-27 DIAGNOSIS — M150 Primary generalized (osteo)arthritis: Secondary | ICD-10-CM | POA: Diagnosis not present

## 2014-04-27 DIAGNOSIS — Z471 Aftercare following joint replacement surgery: Secondary | ICD-10-CM | POA: Diagnosis not present

## 2014-04-27 DIAGNOSIS — F329 Major depressive disorder, single episode, unspecified: Secondary | ICD-10-CM | POA: Diagnosis not present

## 2014-04-27 DIAGNOSIS — M810 Age-related osteoporosis without current pathological fracture: Secondary | ICD-10-CM | POA: Diagnosis not present

## 2014-04-27 MED ORDER — HYDROCODONE-ACETAMINOPHEN 10-325 MG PO TABS: 1.0000 | ORAL_TABLET | ORAL | Status: DC | PRN

## 2014-04-27 NOTE — Telephone Encounter (Signed)
Prescription available, patient aware  

## 2014-04-27 NOTE — Telephone Encounter (Signed)
Patient is calling requesting a refill on pain medication HYDROcodone-acetaminophen (NORCO) 10-325 MG per tablet [[id number]] please advise?

## 2014-04-29 ENCOUNTER — Other Ambulatory Visit: Payer: Self-pay | Admitting: Orthopedic Surgery

## 2014-04-29 DIAGNOSIS — F329 Major depressive disorder, single episode, unspecified: Secondary | ICD-10-CM | POA: Diagnosis not present

## 2014-04-29 DIAGNOSIS — M150 Primary generalized (osteo)arthritis: Secondary | ICD-10-CM | POA: Diagnosis not present

## 2014-04-29 DIAGNOSIS — D649 Anemia, unspecified: Secondary | ICD-10-CM | POA: Diagnosis not present

## 2014-04-29 DIAGNOSIS — K219 Gastro-esophageal reflux disease without esophagitis: Secondary | ICD-10-CM | POA: Diagnosis not present

## 2014-04-29 DIAGNOSIS — M810 Age-related osteoporosis without current pathological fracture: Secondary | ICD-10-CM | POA: Diagnosis not present

## 2014-04-29 DIAGNOSIS — J449 Chronic obstructive pulmonary disease, unspecified: Secondary | ICD-10-CM | POA: Diagnosis not present

## 2014-04-29 DIAGNOSIS — I10 Essential (primary) hypertension: Secondary | ICD-10-CM | POA: Diagnosis not present

## 2014-04-29 DIAGNOSIS — F419 Anxiety disorder, unspecified: Secondary | ICD-10-CM | POA: Diagnosis not present

## 2014-04-29 DIAGNOSIS — Z471 Aftercare following joint replacement surgery: Secondary | ICD-10-CM | POA: Diagnosis not present

## 2014-04-29 MED ORDER — ACETAMINOPHEN-CODEINE #3 300-30 MG PO TABS: 1.0000 | ORAL_TABLET | ORAL | Status: DC | PRN

## 2014-04-29 NOTE — Telephone Encounter (Signed)
Patient picked up Rx

## 2014-05-01 DIAGNOSIS — J449 Chronic obstructive pulmonary disease, unspecified: Secondary | ICD-10-CM | POA: Diagnosis not present

## 2014-05-01 DIAGNOSIS — K219 Gastro-esophageal reflux disease without esophagitis: Secondary | ICD-10-CM | POA: Diagnosis not present

## 2014-05-01 DIAGNOSIS — F419 Anxiety disorder, unspecified: Secondary | ICD-10-CM | POA: Diagnosis not present

## 2014-05-01 DIAGNOSIS — F329 Major depressive disorder, single episode, unspecified: Secondary | ICD-10-CM | POA: Diagnosis not present

## 2014-05-01 DIAGNOSIS — Z471 Aftercare following joint replacement surgery: Secondary | ICD-10-CM | POA: Diagnosis not present

## 2014-05-01 DIAGNOSIS — D649 Anemia, unspecified: Secondary | ICD-10-CM | POA: Diagnosis not present

## 2014-05-01 DIAGNOSIS — M150 Primary generalized (osteo)arthritis: Secondary | ICD-10-CM | POA: Diagnosis not present

## 2014-05-01 DIAGNOSIS — M810 Age-related osteoporosis without current pathological fracture: Secondary | ICD-10-CM | POA: Diagnosis not present

## 2014-05-01 DIAGNOSIS — I10 Essential (primary) hypertension: Secondary | ICD-10-CM | POA: Diagnosis not present

## 2014-05-04 DIAGNOSIS — I10 Essential (primary) hypertension: Secondary | ICD-10-CM | POA: Diagnosis not present

## 2014-05-04 DIAGNOSIS — Z471 Aftercare following joint replacement surgery: Secondary | ICD-10-CM | POA: Diagnosis not present

## 2014-05-04 DIAGNOSIS — F329 Major depressive disorder, single episode, unspecified: Secondary | ICD-10-CM | POA: Diagnosis not present

## 2014-05-04 DIAGNOSIS — F419 Anxiety disorder, unspecified: Secondary | ICD-10-CM | POA: Diagnosis not present

## 2014-05-04 DIAGNOSIS — M810 Age-related osteoporosis without current pathological fracture: Secondary | ICD-10-CM | POA: Diagnosis not present

## 2014-05-04 DIAGNOSIS — K219 Gastro-esophageal reflux disease without esophagitis: Secondary | ICD-10-CM | POA: Diagnosis not present

## 2014-05-04 DIAGNOSIS — M150 Primary generalized (osteo)arthritis: Secondary | ICD-10-CM | POA: Diagnosis not present

## 2014-05-04 DIAGNOSIS — D649 Anemia, unspecified: Secondary | ICD-10-CM | POA: Diagnosis not present

## 2014-05-04 DIAGNOSIS — J449 Chronic obstructive pulmonary disease, unspecified: Secondary | ICD-10-CM | POA: Diagnosis not present

## 2014-05-05 ENCOUNTER — Encounter: Payer: Self-pay | Admitting: Orthopedic Surgery

## 2014-05-05 ENCOUNTER — Ambulatory Visit (INDEPENDENT_AMBULATORY_CARE_PROVIDER_SITE_OTHER): Payer: Self-pay | Admitting: Orthopedic Surgery

## 2014-05-05 VITALS — BP 135/71 | Ht 63.0 in | Wt 197.0 lb

## 2014-05-05 DIAGNOSIS — M75102 Unspecified rotator cuff tear or rupture of left shoulder, not specified as traumatic: Secondary | ICD-10-CM

## 2014-05-05 DIAGNOSIS — Z96612 Presence of left artificial shoulder joint: Secondary | ICD-10-CM

## 2014-05-05 NOTE — Progress Notes (Signed)
Chief Complaint  Patient presents with  . 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 Patient is doing well with good pain relief soreness from surgery occupational therapy occurring at home patient doing well progressing has decreased her pain medication to hydrocodone  Continue occupational therapy DC the sling located Drive follow-up 6 weeks

## 2014-05-06 DIAGNOSIS — J449 Chronic obstructive pulmonary disease, unspecified: Secondary | ICD-10-CM | POA: Diagnosis not present

## 2014-05-06 DIAGNOSIS — M150 Primary generalized (osteo)arthritis: Secondary | ICD-10-CM | POA: Diagnosis not present

## 2014-05-06 DIAGNOSIS — D649 Anemia, unspecified: Secondary | ICD-10-CM | POA: Diagnosis not present

## 2014-05-06 DIAGNOSIS — K219 Gastro-esophageal reflux disease without esophagitis: Secondary | ICD-10-CM | POA: Diagnosis not present

## 2014-05-06 DIAGNOSIS — F329 Major depressive disorder, single episode, unspecified: Secondary | ICD-10-CM | POA: Diagnosis not present

## 2014-05-06 DIAGNOSIS — I10 Essential (primary) hypertension: Secondary | ICD-10-CM | POA: Diagnosis not present

## 2014-05-06 DIAGNOSIS — F419 Anxiety disorder, unspecified: Secondary | ICD-10-CM | POA: Diagnosis not present

## 2014-05-06 DIAGNOSIS — Z471 Aftercare following joint replacement surgery: Secondary | ICD-10-CM | POA: Diagnosis not present

## 2014-05-06 DIAGNOSIS — M810 Age-related osteoporosis without current pathological fracture: Secondary | ICD-10-CM | POA: Diagnosis not present

## 2014-05-08 DIAGNOSIS — F329 Major depressive disorder, single episode, unspecified: Secondary | ICD-10-CM | POA: Diagnosis not present

## 2014-05-08 DIAGNOSIS — I10 Essential (primary) hypertension: Secondary | ICD-10-CM | POA: Diagnosis not present

## 2014-05-08 DIAGNOSIS — J449 Chronic obstructive pulmonary disease, unspecified: Secondary | ICD-10-CM | POA: Diagnosis not present

## 2014-05-08 DIAGNOSIS — M810 Age-related osteoporosis without current pathological fracture: Secondary | ICD-10-CM | POA: Diagnosis not present

## 2014-05-08 DIAGNOSIS — K219 Gastro-esophageal reflux disease without esophagitis: Secondary | ICD-10-CM | POA: Diagnosis not present

## 2014-05-08 DIAGNOSIS — F419 Anxiety disorder, unspecified: Secondary | ICD-10-CM | POA: Diagnosis not present

## 2014-05-08 DIAGNOSIS — M150 Primary generalized (osteo)arthritis: Secondary | ICD-10-CM | POA: Diagnosis not present

## 2014-05-08 DIAGNOSIS — Z471 Aftercare following joint replacement surgery: Secondary | ICD-10-CM | POA: Diagnosis not present

## 2014-05-08 DIAGNOSIS — D649 Anemia, unspecified: Secondary | ICD-10-CM | POA: Diagnosis not present

## 2014-05-11 ENCOUNTER — Telehealth: Payer: Self-pay | Admitting: Orthopedic Surgery

## 2014-05-11 DIAGNOSIS — Z471 Aftercare following joint replacement surgery: Secondary | ICD-10-CM | POA: Diagnosis not present

## 2014-05-11 DIAGNOSIS — J449 Chronic obstructive pulmonary disease, unspecified: Secondary | ICD-10-CM | POA: Diagnosis not present

## 2014-05-11 DIAGNOSIS — M150 Primary generalized (osteo)arthritis: Secondary | ICD-10-CM | POA: Diagnosis not present

## 2014-05-11 DIAGNOSIS — I10 Essential (primary) hypertension: Secondary | ICD-10-CM | POA: Diagnosis not present

## 2014-05-11 DIAGNOSIS — F329 Major depressive disorder, single episode, unspecified: Secondary | ICD-10-CM | POA: Diagnosis not present

## 2014-05-11 DIAGNOSIS — M810 Age-related osteoporosis without current pathological fracture: Secondary | ICD-10-CM | POA: Diagnosis not present

## 2014-05-11 DIAGNOSIS — K219 Gastro-esophageal reflux disease without esophagitis: Secondary | ICD-10-CM | POA: Diagnosis not present

## 2014-05-11 DIAGNOSIS — D649 Anemia, unspecified: Secondary | ICD-10-CM | POA: Diagnosis not present

## 2014-05-11 DIAGNOSIS — F419 Anxiety disorder, unspecified: Secondary | ICD-10-CM | POA: Diagnosis not present

## 2014-05-11 NOTE — Telephone Encounter (Signed)
Patient called to relay that her home therapy will be finished this Friday, 05/15/14; therefore, requests orders for out-patient occupational/physical therapy at [facility] out-patient.  Patient's ph# is [phone]

## 2014-05-11 NOTE — Telephone Encounter (Addendum)
Heather [name] is calling to let us know that the last day of her occupational therapy (in home) will be Friday 05/15/14 and they state she needs further therapy at outpatient therapy and we need to set her up, please advise?

## 2014-05-11 NOTE — Telephone Encounter (Signed)
3 times weekly for 6 weeks?

## 2014-05-11 NOTE — Telephone Encounter (Signed)
Yes

## 2014-05-12 ENCOUNTER — Other Ambulatory Visit: Payer: Self-pay | Admitting: *Deleted

## 2014-05-12 DIAGNOSIS — Z96612 Presence of left artificial shoulder joint: Secondary | ICD-10-CM

## 2014-05-12 NOTE — Telephone Encounter (Signed)
Faxed as noted, and patient aware.

## 2014-05-12 NOTE — Telephone Encounter (Signed)
Order printed, please fax order to aph therapy dept and advise patient

## 2014-05-12 NOTE — Telephone Encounter (Signed)
Order faxed and patient aware.

## 2014-05-13 DIAGNOSIS — D649 Anemia, unspecified: Secondary | ICD-10-CM | POA: Diagnosis not present

## 2014-05-13 DIAGNOSIS — M810 Age-related osteoporosis without current pathological fracture: Secondary | ICD-10-CM | POA: Diagnosis not present

## 2014-05-13 DIAGNOSIS — I10 Essential (primary) hypertension: Secondary | ICD-10-CM | POA: Diagnosis not present

## 2014-05-13 DIAGNOSIS — J449 Chronic obstructive pulmonary disease, unspecified: Secondary | ICD-10-CM | POA: Diagnosis not present

## 2014-05-13 DIAGNOSIS — Z471 Aftercare following joint replacement surgery: Secondary | ICD-10-CM | POA: Diagnosis not present

## 2014-05-13 DIAGNOSIS — K219 Gastro-esophageal reflux disease without esophagitis: Secondary | ICD-10-CM | POA: Diagnosis not present

## 2014-05-13 DIAGNOSIS — F419 Anxiety disorder, unspecified: Secondary | ICD-10-CM | POA: Diagnosis not present

## 2014-05-13 DIAGNOSIS — M150 Primary generalized (osteo)arthritis: Secondary | ICD-10-CM | POA: Diagnosis not present

## 2014-05-13 DIAGNOSIS — F329 Major depressive disorder, single episode, unspecified: Secondary | ICD-10-CM | POA: Diagnosis not present

## 2014-05-15 DIAGNOSIS — I10 Essential (primary) hypertension: Secondary | ICD-10-CM | POA: Diagnosis not present

## 2014-05-15 DIAGNOSIS — M810 Age-related osteoporosis without current pathological fracture: Secondary | ICD-10-CM | POA: Diagnosis not present

## 2014-05-15 DIAGNOSIS — F419 Anxiety disorder, unspecified: Secondary | ICD-10-CM | POA: Diagnosis not present

## 2014-05-15 DIAGNOSIS — J449 Chronic obstructive pulmonary disease, unspecified: Secondary | ICD-10-CM | POA: Diagnosis not present

## 2014-05-15 DIAGNOSIS — K219 Gastro-esophageal reflux disease without esophagitis: Secondary | ICD-10-CM | POA: Diagnosis not present

## 2014-05-15 DIAGNOSIS — D649 Anemia, unspecified: Secondary | ICD-10-CM | POA: Diagnosis not present

## 2014-05-15 DIAGNOSIS — F329 Major depressive disorder, single episode, unspecified: Secondary | ICD-10-CM | POA: Diagnosis not present

## 2014-05-15 DIAGNOSIS — Z471 Aftercare following joint replacement surgery: Secondary | ICD-10-CM | POA: Diagnosis not present

## 2014-05-15 DIAGNOSIS — M150 Primary generalized (osteo)arthritis: Secondary | ICD-10-CM | POA: Diagnosis not present

## 2014-05-18 ENCOUNTER — Encounter (HOSPITAL_COMMUNITY): Payer: Self-pay | Admitting: Specialist

## 2014-05-18 ENCOUNTER — Ambulatory Visit (HOSPITAL_COMMUNITY): Payer: Commercial Managed Care - HMO | Attending: Orthopedic Surgery | Admitting: Specialist

## 2014-05-18 DIAGNOSIS — M25612 Stiffness of left shoulder, not elsewhere classified: Secondary | ICD-10-CM | POA: Insufficient documentation

## 2014-05-18 DIAGNOSIS — M6281 Muscle weakness (generalized): Secondary | ICD-10-CM | POA: Diagnosis not present

## 2014-05-18 DIAGNOSIS — Z471 Aftercare following joint replacement surgery: Secondary | ICD-10-CM | POA: Diagnosis not present

## 2014-05-18 DIAGNOSIS — M25512 Pain in left shoulder: Secondary | ICD-10-CM | POA: Diagnosis not present

## 2014-05-18 DIAGNOSIS — M2560 Stiffness of unspecified joint, not elsewhere classified: Secondary | ICD-10-CM

## 2014-05-18 DIAGNOSIS — Z96612 Presence of left artificial shoulder joint: Secondary | ICD-10-CM | POA: Insufficient documentation

## 2014-05-18 NOTE — Therapy (Signed)
[facility] [address] [place], [place], [address] Phone: [phone]   Fax:  [phone]  Occupational Therapy Evaluation  Patient Details  Name: Heather [name] MRN: [id number] Date of Birth: 04-26-1940 Referring Provider:  Vickki Hearing, MD  Encounter Date: 05/18/2014      OT End of Session - 05/18/14 1628    Visit Number 1   Number of Visits 24   Date for OT Re-Evaluation 07/17/14  mini reassess on 06/17/14   Authorization Type [employer]   Authorization Time Period no authorization required   OT Start Time 1350   OT Stop Time 1430   OT Time Calculation (min) 40 min   Activity Tolerance Patient tolerated treatment well      Past Medical History  Diagnosis Date  . HTN (hypertension)   . COPD (chronic obstructive pulmonary disease)   . GERD (gastroesophageal reflux disease)   . Glaucoma   . Anxiety   . Depression   . Anemia   . Arthritis   . Gout     Past Surgical History  Procedure Laterality Date  . Joint replacement Bilateral     hip   . Joint replacement Right     knee  . Shoulder arthroscopy Right   . Abdominal hysterectomy    . Foot surgery Left     repair of "kissing Cousins"  . Bunionectomy Bilateral   . Back surgery      lumbar-disc  . Shoulder hemi-arthroplasty Left 03/25/2014    Procedure: LEFT SHOULDER HEMI-ARTHROPLASTY;  Surgeon: Vickki Hearing, MD;  Location: [facility];  Service: Orthopedics;  Laterality: Left;    There were no vitals filed for this visit.  Visit Diagnosis:  Shoulder pain, left - Plan: Ot plan of care cert/re-cert  Limited joint range of motion - Plan: Ot plan of care cert/re-cert  Decreased muscle strength - Plan: Ot plan of care cert/re-cert  S/P shoulder replacement, left - Plan: Ot plan of care cert/re-cert      Subjective Assessment - 05/18/14 1447    Symptoms S:  I am concerned that I wont be able to braid my hair or wash it again.   Pertinent History  Heather [name] recieved occupational therapy in March and April of 2015 for left shoulder pain.  She continued to have increased pain in her left shoulder after therapy concluded and consequently had a left total shoulder replacement on 03/25/14.  After several weeks of home health OT, she has been referred to outpatient Occupational Therapy for evaluation and treatment.   Limitations AAROM through 3/16 then progress to AROM as able   Patient Stated Goals I want to get back to my full capacity.   Currently in Pain? Yes   Pain Score 6    Pain Location Shoulder   Pain Orientation Left   Pain Descriptors / Indicators Aching   Pain Type Acute pain   Pain Onset More than a month ago   Pain Frequency Intermittent   Aggravating Factors  use   Pain Relieving Factors rest   Effect of Pain on Daily Activities unable to use above waist height           [facility] OT Assessment - 05/18/14 0001    Assessment   Diagnosis S/P Left Total Shoulder Replacement   Onset Date 03/25/14   Prior Therapy home health   Precautions   Precautions Shoulder   Type of Shoulder Precautions AAROM through 05/20/14 progressing to AROM as  able   Balance Screen   Has the patient fallen in the past 6 months No   Has the patient had a decrease in activity level because of a fear of falling?  No   Is the patient reluctant to leave their home because of a fear of falling?  No   Home  Environment   Family/patient expects to be discharged to: Private residence   Lives With Alone   Prior Function   Level of Independence Independent with basic ADLs;Independent with homemaking with ambulation  driving   Vocation Retired   Leisure enjoys gardening, sewing, baking, and cooking   ADL   ADL comments unable to use her left arm with functional activities above waist height.  Unable to braid or wash her hair, pick up anything heavy.   Written Expression   Dominant Hand Right   Vision - History   Baseline Vision Wears glasses all the  time   Cognition   Overall Cognitive Status Within Functional Limits for tasks assessed   Observation/Other Assessments   Skin Integrity 7 inch healed surgical incision with moderate restrictions noted on anterior shoulder   Quick DASH  61.36   Sensation   Light Touch Appears Intact   Coordination   Gross Motor Movements are Fluid and Coordinated Yes   Fine Motor Movements are Fluid and Coordinated Yes   ROM / Strength   AROM / PROM / Strength AROM;PROM;Strength   Palpation   Palpation mod-max fascial restrictions in left shoulder region,    AROM   Overall AROM Comments assessed in supine, ER/IR with shoulder adducted   AROM Assessment Site Shoulder   Right/Left Shoulder Left   Left Shoulder Flexion 40 Degrees   Left Shoulder ABduction 44 Degrees   Left Shoulder Internal Rotation 76 Degrees   Left Shoulder External Rotation 30 Degrees   PROM   Overall PROM Comments assessed in supine, ER/IR with shoulder adducted   PROM Assessment Site Shoulder   Right/Left Shoulder Left   Left Shoulder Flexion 88 Degrees   Left Shoulder ABduction 55 Degrees   Left Shoulder Internal Rotation 76 Degrees   Left Shoulder External Rotation 35 Degrees   Strength   Overall Strength Comments strength not assessed due to recent surgery   Strength Assessment Site Shoulder   Right/Left Shoulder Left             Quick Dash - 05/18/14 0001    Open a tight or new jar Unable   Do heavy household chores (wash walls, wash floors) Moderate difficulty   Carry a shopping bag or briefcase Severe difficulty   Wash your back Unable   Use a knife to cut food Unable   Recreational activities in which you take some force or impact through your arm, shoulder, or hand (golf, hammering, tennis) Unable   During the past week, to what extent has your arm, shoulder or hand problem interfered with your normal social activities with family, friends, neighbors, or groups? Not at all   During the past week, to what  extent has your arm, shoulder or hand problem limited your work or other regular daily activities Modererately   Arm, shoulder, or hand pain. Moderate   Tingling (pins and needles) in your arm, shoulder, or hand None   Difficulty Sleeping Moderate difficulty   DASH Score 61.36 %             OT Treatments/Exercises (OP) - 05/18/14 0001    Manual Therapy   Manual  Therapy Myofascial release   Myofascial Release MFR to left upper arm, scapular, and shoulder region to decrease pain and improve pain free mobility in left upper extremity.  PROM to shoulder through availale range.  scar massage to healed surgical incision.                OT Education - 05/18/14 1628    Education provided Yes   Education Details towel slides   Person(s) Educated Patient   Methods Explanation;Demonstration;Handout   Comprehension Verbalized understanding          OT Short Term Goals - 05/18/14 1633    OT SHORT TERM GOAL #1   Title Patient will be educated on HEP.   Time 6   Period Weeks   Status New   OT SHORT TERM GOAL #2   Title Patient will have WFL PROM in her left shoulder in order to don and doff shirts and coats with increased ease.   Time 6   Period Weeks   Status New   OT SHORT TERM GOAL #3   Title Patient will improve left shoulder strength to 3+/5 for increased ability to lift bags of groceries.    Time 6   Period Weeks   Status New   OT SHORT TERM GOAL #4   Title Patient will decrease pain to 4/10 or better in left shoulder during functional activities.    Time 6   Period Weeks   Status New   OT SHORT TERM GOAL #5   Title Patient will decrease fascial restrictions to moderate in her left shoulder.   Time 6   Period Weeks   Status New           OT Long Term Goals - 05/18/14 1635    OT LONG TERM GOAL #1   Title Patient will return to prior level of independence with all functional activities and leisure activities.    Time 12   Period Weeks   Status New   OT  LONG TERM GOAL #2   Title Patient will have WFL AROM in her left shoulder for increased ability to braid and wash her hair.   Time 12   Period Weeks   Status New   OT LONG TERM GOAL #3   Title Patient will improve left shoulder strength to 4+/5 for increased ability to lift pots an pans when cooking.    Time 12   Period Weeks   Status New   OT LONG TERM GOAL #4   Title Patient will decrease pain to 2/10 in left shoulder while completing functional activities.    Time 12   Period Weeks   Status New   OT LONG TERM GOAL #5   Title Patient will decrease fascial restrictions in her left shoulder to minimal.   Time 12   Period Weeks   Status New               Plan - 05/18/14 1630    Clinical Impression Statement A:  Patient is a 74 year old who had a left total shoulder replacement on 03/25/13, followed by several weeks of home health occupational therapy.  She has been referred to occupational therapy for evaluation and treatment in order to return to prior level of function with her B/IADLs,and leisure activities.    Pt will benefit from skilled therapeutic intervention in order to improve on the following deficits (Retired) Decreased range of motion;Decreased strength;Decreased scar mobility;Increased fascial restricitons;Increased muscle spasms;Pain  Rehab Potential Good   OT Frequency 2x / week   OT Duration 12 weeks   OT Treatment/Interventions Self-care/ADL training;Cryotherapy;Moist Heat;Neuromuscular education;Manual Therapy;Passive range of motion;Therapeutic exercises;Patient/family education   Plan P:  Skilled OT intervention to improve on above mentioned deficits in order to return to prior level of independence with all B/IADLs and leisure activities.  Next treatment:  PROM adding AAROM as able, scapular stability, ball stretches, isometrics.     Consulted and Agree with Plan of Care Patient          G-Codes - 06/09/14 1639    Functional Assessment Tool Used DASH  scored 61.36% impairment level   Functional Limitation Carrying, moving and handling objects   Carrying, Moving and Handling Objects Current Status [phone]) At least 60 percent but less than 80 percent impaired, limited or restricted   Carrying, Moving and Handling Objects Goal Status ([id number]) At least 1 percent but less than 20 percent impaired, limited or restricted      Problem List Patient Active Problem List   Diagnosis Date Noted  . HTN (hypertension) 04/17/2014  . Dry eyes 04/14/2014  . Bleeding from the nose 04/01/2014  . Renal insufficiency 04/01/2014  . Rotator cuff arthropathy   . Muscle weakness (generalized) 05/26/2013  . Pain in joint, shoulder region 05/26/2013  . Rotator cuff syndrome 05/22/2013    Shirlean Mylar, OTR/L [phone]  06-09-14, 4:44 PM  [facility] [address] [place], [place], [address] Phone: [phone]   Fax:  [phone]

## 2014-05-18 NOTE — Patient Instructions (Signed)
SHOULDER: Flexion On Table   Place hands on table, elbows straight. Move hips away from body. Press hands down into table. Hold ___ seconds. ___ reps per set, ___ sets per day, ___ days per week  Abduction (Passive)   With arm out to side, resting on table, lower head toward arm, keeping trunk away from table. Hold ____ seconds. Repeat ____ times. Do ____ sessions per day.  Copyright  VHI. All rights reserved.     Internal Rotation (Assistive)   Seated with elbow bent at right angle and held against side, slide arm on table surface in an inward arc. Repeat ____ times. Do ____ sessions per day. Activity: Use this motion to brush crumbs off the table.  Copyright  VHI. All rights reserved.   

## 2014-05-21 ENCOUNTER — Ambulatory Visit (HOSPITAL_COMMUNITY): Payer: Commercial Managed Care - HMO

## 2014-05-21 ENCOUNTER — Encounter (HOSPITAL_COMMUNITY): Payer: Self-pay

## 2014-05-21 DIAGNOSIS — Z471 Aftercare following joint replacement surgery: Secondary | ICD-10-CM | POA: Diagnosis not present

## 2014-05-21 DIAGNOSIS — Z96612 Presence of left artificial shoulder joint: Secondary | ICD-10-CM | POA: Diagnosis not present

## 2014-05-21 DIAGNOSIS — M25512 Pain in left shoulder: Secondary | ICD-10-CM | POA: Diagnosis not present

## 2014-05-21 DIAGNOSIS — M2560 Stiffness of unspecified joint, not elsewhere classified: Secondary | ICD-10-CM

## 2014-05-21 DIAGNOSIS — M25612 Stiffness of left shoulder, not elsewhere classified: Secondary | ICD-10-CM | POA: Diagnosis not present

## 2014-05-21 DIAGNOSIS — M6281 Muscle weakness (generalized): Secondary | ICD-10-CM | POA: Diagnosis not present

## 2014-05-21 NOTE — Therapy (Signed)
[facility] [address] [place], [place], [address] Phone: [phone]   Fax:  [phone]  Occupational Therapy Treatment  Patient Details  Name: Heather [name] MRN: [id number] Date of Birth: [birth year] Referring Provider:  Vickki Hearing, MD  Encounter Date: 05/21/2014      OT End of Session - 05/21/14 1339    Visit Number 2   Number of Visits 24   Date for OT Re-Evaluation 07/17/14  mini reassess on 06/17/14   Authorization Type [employer]   Authorization Time Period no authorization required   OT Start Time 1300   OT Stop Time 1340   OT Time Calculation (min) 40 min   Activity Tolerance Patient tolerated treatment well      Past Medical History  Diagnosis Date  . HTN (hypertension)   . COPD (chronic obstructive pulmonary disease)   . GERD (gastroesophageal reflux disease)   . Glaucoma   . Anxiety   . Depression   . Anemia   . Arthritis   . Gout     Past Surgical History  Procedure Laterality Date  . Joint replacement Bilateral     hip   . Joint replacement Right     knee  . Shoulder arthroscopy Right   . Abdominal hysterectomy    . Foot surgery Left     repair of "kissing Cousins"  . Bunionectomy Bilateral   . Back surgery      lumbar-disc  . Shoulder hemi-arthroplasty Left 03/25/2014    Procedure: LEFT SHOULDER HEMI-ARTHROPLASTY;  Surgeon: Vickki Hearing, MD;  Location: [facility];  Service: Orthopedics;  Laterality: Left;    There were no vitals filed for this visit.  Visit Diagnosis:  Shoulder pain, left  Limited joint range of motion  Decreased muscle strength      Subjective Assessment - 05/21/14 1328    Symptoms S: Sometimes the pain goes all the way my arm.    Currently in Pain? Yes   Pain Score 6    Pain Location Shoulder   Pain Orientation Left   Pain Descriptors / Indicators Aching   Pain Type Acute pain            [facility] OT Assessment - 05/21/14 1330    Assessment   Diagnosis S/P Left Total Shoulder Replacement   Precautions   Precautions Shoulder   Type of Shoulder Precautions AAROM through 05/20/14 progressing to AROM as able                OT Treatments/Exercises (OP) - 05/21/14 1330    Exercises   Exercises Shoulder   Shoulder Exercises: Supine   Protraction PROM;10 reps   Horizontal ABduction PROM;10 reps   External Rotation PROM;10 reps   Internal Rotation PROM;10 reps   Flexion PROM;10 reps   ABduction PROM;10 reps   Shoulder Exercises: ROM/Strengthening   Thumb Tacks 1' low level   Manual Therapy   Manual Therapy Myofascial release;Scapular mobilization   Myofascial Release Myofascial release to left upper arm, trapezius, and scapularis region to decrease joint mobility and increase joint mobility in a pain free zone.    Scapular Mobilization Anterior-posterior glenohumeral mobilization (low grade) acromioclavicular joint mobilization 8 reps each holding for 3 seconds.                  OT Short Term Goals - 05/21/14 1329    OT SHORT TERM GOAL #1   Title Patient will be educated on HEP.  Status On-going   OT SHORT TERM GOAL #2   Title Patient will have WFL PROM in her left shoulder in order to don and doff shirts and coats with increased ease.   Status On-going   OT SHORT TERM GOAL #3   Title Patient will improve left shoulder strength to 3+/5 for increased ability to lift bags of groceries.    Status On-going   OT SHORT TERM GOAL #4   Title Patient will decrease pain to 4/10 or better in left shoulder during functional activities.    Status On-going   OT SHORT TERM GOAL #5   Title Patient will decrease fascial restrictions to moderate in her left shoulder.   Status On-going           OT Long Term Goals - 05/21/14 1329    OT LONG TERM GOAL #1   Title Patient will return to prior level of independence with all functional activities and leisure activities.    Status On-going   OT LONG TERM GOAL #2    Title Patient will have WFL AROM in her left shoulder for increased ability to braid and wash her hair.   Status On-going   OT LONG TERM GOAL #3   Title Patient will improve left shoulder strength to 4+/5 for increased ability to lift pots an pans when cooking.    Status On-going   OT LONG TERM GOAL #4   Title Patient will decrease pain to 2/10 in left shoulder while completing functional activities.    Status On-going   OT LONG TERM GOAL #5   Title Patient will decrease fascial restrictions in her left shoulder to minimal.   Status On-going               Plan - 05/21/14 1340    Clinical Impression Statement A: Initiated myofascial release, manual stretching, scapular mobilizations, therapy ball stretches and thumb tacks this date. Patient's passive ROM was limited; only able to tolerate 50% of range before increased pain.    Plan P: Continue with PROM supine working on increasing range of motion with decreased pain. Add isometrics.        Problem List Patient Active Problem List   Diagnosis Date Noted  . HTN (hypertension) 04/17/2014  . Dry eyes 04/14/2014  . Bleeding from the nose 04/01/2014  . Renal insufficiency 04/01/2014  . Rotator cuff arthropathy   . Muscle weakness (generalized) 05/26/2013  . Pain in joint, shoulder region 05/26/2013  . Rotator cuff syndrome 05/22/2013    Limmie Patricia, OTR/L,CBIS  [phone]  05/21/2014, 1:44 PM  [facility] [address] [place], [place], [address] Phone: [phone]   Fax:  [phone]

## 2014-05-25 ENCOUNTER — Ambulatory Visit (HOSPITAL_COMMUNITY): Payer: Commercial Managed Care - HMO | Admitting: Specialist

## 2014-05-25 DIAGNOSIS — M2560 Stiffness of unspecified joint, not elsewhere classified: Secondary | ICD-10-CM

## 2014-05-25 DIAGNOSIS — Z96612 Presence of left artificial shoulder joint: Secondary | ICD-10-CM | POA: Diagnosis not present

## 2014-05-25 DIAGNOSIS — M25512 Pain in left shoulder: Secondary | ICD-10-CM

## 2014-05-25 DIAGNOSIS — Z471 Aftercare following joint replacement surgery: Secondary | ICD-10-CM | POA: Diagnosis not present

## 2014-05-25 DIAGNOSIS — M25612 Stiffness of left shoulder, not elsewhere classified: Secondary | ICD-10-CM | POA: Diagnosis not present

## 2014-05-25 DIAGNOSIS — M6281 Muscle weakness (generalized): Secondary | ICD-10-CM | POA: Diagnosis not present

## 2014-05-25 NOTE — Therapy (Signed)
[place], [place], [address] Phone: [phone]   Fax:  [phone]  Occupational Therapy Treatment  Patient Details  Name: Heather [name] MRN: [id number] Date of Birth: 05-31-1940 Referring Provider:  Carole Civil, MD  Encounter Date: 05/25/2014      OT End of Session - 05/25/14 1426    Visit Number 3   Number of Visits 24   Date for OT Re-Evaluation 07/17/14  mini reassess 06/17/14   Authorization Type [employer]   Authorization Time Period no authorization required update g code 10th visit   Authorization - Visit Number 3   Authorization - Number of Visits 10   OT Start Time 1346   OT Stop Time 1423   OT Time Calculation (min) 37 min   Activity Tolerance Patient tolerated treatment well   Behavior During Therapy [facility] for tasks assessed/performed      Past Medical History  Diagnosis Date  . HTN (hypertension)   . COPD (chronic obstructive pulmonary disease)   . GERD (gastroesophageal reflux disease)   . Glaucoma   . Anxiety   . Depression   . Anemia   . Arthritis   . Gout     Past Surgical History  Procedure Laterality Date  . Joint replacement Bilateral     hip   . Joint replacement Right     knee  . Shoulder arthroscopy Right   . Abdominal hysterectomy    . Foot surgery Left     repair of "kissing Cousins"  . Bunionectomy Bilateral   . Back surgery      lumbar-disc  . Shoulder hemi-arthroplasty Left 03/25/2014    Procedure: LEFT SHOULDER HEMI-ARTHROPLASTY;  Surgeon: Carole Civil, MD;  Location: [facility];  Service: Orthopedics;  Laterality: Left;    There were no vitals filed for this visit.  Visit Diagnosis:  Shoulder pain, left  Limited joint range of motion      Subjective Assessment - 05/25/14 1346    Symptoms S:  Its coming along.   Limitations AAROM through 3/16 then progress to AROM as able   Currently in Pain? Yes   Pain Score 5    Pain Location  Shoulder   Pain Orientation Left   Pain Descriptors / Indicators Aching   Pain Type Acute pain   Pain Onset More than a month ago   Pain Frequency Intermittent   Aggravating Factors  use   Pain Relieving Factors rest   Effect of Pain on Daily Activities unable to use above waist height            [facility] OT Assessment - 05/25/14 0001    Assessment   Diagnosis S/P Left Total Shoulder Replacement   Precautions   Precautions Shoulder   Type of Shoulder Precautions AAROM through 05/20/14 progressing to AROM as able                OT Treatments/Exercises (OP) - 05/25/14 0001    Exercises   Exercises Shoulder   Shoulder Exercises: Supine   Protraction PROM;10 reps;AAROM;5 reps   Protraction Limitations dowel and hand over hand   Horizontal ABduction PROM;10 reps;AAROM;5 reps   Horizontal ABduction Limitations dowel and hand over hand   External Rotation PROM;10 reps;AAROM;5 reps   Internal Rotation PROM;10 reps;AAROM;5 reps   Flexion PROM;10 reps;AAROM;5 reps   ABduction PROM;10 reps;AAROM;5 reps   ABduction Limitations dowel and hand over hand   Other Supine Exercises serratus anterior  punch   Shoulder Exercises: Seated   Elevation AROM;15 reps   Extension AROM;15 reps  palms forward   Row AROM;15 reps   Shoulder Exercises: Therapy Ball   Flexion 15 reps   ABduction 15 reps   Shoulder Exercises: ROM/Strengthening   Thumb Tacks 1' low level   Prot/Ret//Elev/Dep 1 minute   Manual Therapy   Manual Therapy Myofascial release   Myofascial Release Myofascial release to left upper arm, trapezius, and scapularis region to decrease joint mobility and increase joint mobility in a pain free zone.                   OT Short Term Goals - 05/21/14 1329    OT SHORT TERM GOAL #1   Title Patient will be educated on HEP.   Status On-going   OT SHORT TERM GOAL #2   Title Patient will have WFL PROM in her left shoulder in order to don and doff shirts and coats with  increased ease.   Status On-going   OT SHORT TERM GOAL #3   Title Patient will improve left shoulder strength to 3+/5 for increased ability to lift bags of groceries.    Status On-going   OT SHORT TERM GOAL #4   Title Patient will decrease pain to 4/10 or better in left shoulder during functional activities.    Status On-going   OT SHORT TERM GOAL #5   Title Patient will decrease fascial restrictions to moderate in her left shoulder.   Status On-going           OT Long Term Goals - 05/21/14 1329    OT LONG TERM GOAL #1   Title Patient will return to prior level of independence with all functional activities and leisure activities.    Status On-going   OT LONG TERM GOAL #2   Title Patient will have WFL AROM in her left shoulder for increased ability to braid and wash her hair.   Status On-going   OT LONG TERM GOAL #3   Title Patient will improve left shoulder strength to 4+/5 for increased ability to lift pots an pans when cooking.    Status On-going   OT LONG TERM GOAL #4   Title Patient will decrease pain to 2/10 in left shoulder while completing functional activities.    Status On-going   OT LONG TERM GOAL #5   Title Patient will decrease fascial restrictions in her left shoulder to minimal.   Status On-going               Plan - 05/25/14 1427    Clinical Impression Statement A:  Added prot/ret//elev/dep this date for scapular stability, and added AAROM gave additional min assist with protraction, horizontal abduction, and abduction   Plan P:  add pulleys and instruct on use for HEP.        Problem List Patient Active Problem List   Diagnosis Date Noted  . HTN (hypertension) 04/17/2014  . Dry eyes 04/14/2014  . Bleeding from the nose 04/01/2014  . Renal insufficiency 04/01/2014  . Rotator cuff arthropathy   . Muscle weakness (generalized) 05/26/2013  . Pain in joint, shoulder region 05/26/2013  . Rotator cuff syndrome 05/22/2013    Vangie Bicker,  OTR/L [phone]  05/25/2014, 2:29 PM  [facility] [address] [place], [place], [address] Phone: [phone]   Fax:  [phone]

## 2014-05-28 ENCOUNTER — Ambulatory Visit (HOSPITAL_COMMUNITY): Payer: Commercial Managed Care - HMO

## 2014-05-28 ENCOUNTER — Encounter (HOSPITAL_COMMUNITY): Payer: Self-pay

## 2014-05-28 DIAGNOSIS — M25512 Pain in left shoulder: Secondary | ICD-10-CM | POA: Diagnosis not present

## 2014-05-28 DIAGNOSIS — M6281 Muscle weakness (generalized): Secondary | ICD-10-CM | POA: Diagnosis not present

## 2014-05-28 DIAGNOSIS — M25612 Stiffness of left shoulder, not elsewhere classified: Secondary | ICD-10-CM | POA: Diagnosis not present

## 2014-05-28 DIAGNOSIS — M2560 Stiffness of unspecified joint, not elsewhere classified: Secondary | ICD-10-CM

## 2014-05-28 DIAGNOSIS — Z96612 Presence of left artificial shoulder joint: Secondary | ICD-10-CM | POA: Diagnosis not present

## 2014-05-28 DIAGNOSIS — Z471 Aftercare following joint replacement surgery: Secondary | ICD-10-CM | POA: Diagnosis not present

## 2014-05-28 NOTE — Therapy (Signed)
[facility] [address] [place], [place], [address] Phone: [phone]   Fax:  [phone]  Occupational Therapy Treatment  Patient Details  Name: Heather [name] MRN: [id number] Date of Birth: 04/17/1940 Referring Provider:  Avon Gully, MD  Encounter Date: 05/28/2014      OT End of Session - 05/28/14 1557    Visit Number 4   Number of Visits 24   Date for OT Re-Evaluation 07/17/14  mini reassess 06/17/14   Authorization Type [employer]   Authorization Time Period no authorization required update g code 10th visit   Authorization - Visit Number 4   Authorization - Number of Visits 10   OT Start Time 1350   OT Stop Time 1430   OT Time Calculation (min) 40 min   Activity Tolerance Patient tolerated treatment well   Behavior During Therapy [facility] for tasks assessed/performed      Past Medical History  Diagnosis Date  . HTN (hypertension)   . COPD (chronic obstructive pulmonary disease)   . GERD (gastroesophageal reflux disease)   . Glaucoma   . Anxiety   . Depression   . Anemia   . Arthritis   . Gout     Past Surgical History  Procedure Laterality Date  . Joint replacement Bilateral     hip   . Joint replacement Right     knee  . Shoulder arthroscopy Right   . Abdominal hysterectomy    . Foot surgery Left     repair of "kissing Cousins"  . Bunionectomy Bilateral   . Back surgery      lumbar-disc  . Shoulder hemi-arthroplasty Left 03/25/2014    Procedure: LEFT SHOULDER HEMI-ARTHROPLASTY;  Surgeon: Vickki Hearing, MD;  Location: [facility];  Service: Orthopedics;  Laterality: Left;    There were no vitals filed for this visit.  Visit Diagnosis:  Shoulder pain, left  Limited joint range of motion  Decreased muscle strength      Subjective Assessment - 05/28/14 1414    Symptoms S: I don't know if I didn't something to it but it has been quite sore for me.    Currently in Pain? Yes   Pain Score 5    Pain Location Shoulder   Pain Orientation Left   Pain Descriptors / Indicators Aching   Pain Type Acute pain            [facility] OT Assessment - 05/28/14 1415    Assessment   Diagnosis S/P Left Total Shoulder Replacement   Precautions   Precautions Shoulder   Type of Shoulder Precautions AAROM through 05/20/14 progressing to AROM as able                OT Treatments/Exercises (OP) - 05/28/14 1415    Exercises   Exercises Shoulder   Shoulder Exercises: Supine   Protraction PROM;5 reps;AAROM;10 reps   Horizontal ABduction PROM;5 reps;AAROM;10 reps   External Rotation PROM;5 reps;AAROM;10 reps   Internal Rotation PROM;5 reps;AAROM;10 reps   Flexion PROM;5 reps;AAROM;10 reps   ABduction PROM;5 reps;AAROM;10 reps   Shoulder Exercises: Pulleys   Flexion 1 minute   ABduction 1 minute   Manual Therapy   Manual Therapy Myofascial release   Myofascial Release Myofascial release to left upper arm, trapezius, and scapularis region to decrease joint mobility and increase joint mobility in a pain free zone. Muscle energy technique to left medial deltoid to relax tone and muscle spasm and improve range of motion.  OT Short Term Goals - 05/21/14 1329    OT SHORT TERM GOAL #1   Title Patient will be educated on HEP.   Status On-going   OT SHORT TERM GOAL #2   Title Patient will have WFL PROM in her left shoulder in order to don and doff shirts and coats with increased ease.   Status On-going   OT SHORT TERM GOAL #3   Title Patient will improve left shoulder strength to 3+/5 for increased ability to lift bags of groceries.    Status On-going   OT SHORT TERM GOAL #4   Title Patient will decrease pain to 4/10 or better in left shoulder during functional activities.    Status On-going   OT SHORT TERM GOAL #5   Title Patient will decrease fascial restrictions to moderate in her left shoulder.   Status On-going           OT Long Term Goals -  05/21/14 1329    OT LONG TERM GOAL #1   Title Patient will return to prior level of independence with all functional activities and leisure activities.    Status On-going   OT LONG TERM GOAL #2   Title Patient will have WFL AROM in her left shoulder for increased ability to braid and wash her hair.   Status On-going   OT LONG TERM GOAL #3   Title Patient will improve left shoulder strength to 4+/5 for increased ability to lift pots an pans when cooking.    Status On-going   OT LONG TERM GOAL #4   Title Patient will decrease pain to 2/10 in left shoulder while completing functional activities.    Status On-going   OT LONG TERM GOAL #5   Title Patient will decrease fascial restrictions in her left shoulder to minimal.   Status On-going               Plan - 05/28/14 1558    Clinical Impression Statement A: Pt stated that only difficulty exercise she is doing at home is the finger crawl up the wall. Suggested that she not push it as hard as she has been and start at a tolerable highet. Then slowly progress further up as she can tolerate. Added pulleys this date. Patient also completed AAROM supine without assistance from therapist.    Plan P: Add proximal shoulder strengthening supine and attempt seated AAROM exercises.         Problem List Patient Active Problem List   Diagnosis Date Noted  . HTN (hypertension) 04/17/2014  . Dry eyes 04/14/2014  . Bleeding from the nose 04/01/2014  . Renal insufficiency 04/01/2014  . Rotator cuff arthropathy   . Muscle weakness (generalized) 05/26/2013  . Pain in joint, shoulder region 05/26/2013  . Rotator cuff syndrome 05/22/2013    Limmie Patricia, OTR/L,CBIS  [phone]  05/28/2014, 4:00 PM  [place] [address] [place], [place], [address] Phone: [phone]   Fax:  [phone]

## 2014-06-01 ENCOUNTER — Ambulatory Visit (HOSPITAL_COMMUNITY): Payer: Commercial Managed Care - HMO | Admitting: Specialist

## 2014-06-01 DIAGNOSIS — M25512 Pain in left shoulder: Secondary | ICD-10-CM | POA: Diagnosis not present

## 2014-06-01 DIAGNOSIS — M2560 Stiffness of unspecified joint, not elsewhere classified: Secondary | ICD-10-CM

## 2014-06-01 DIAGNOSIS — M25612 Stiffness of left shoulder, not elsewhere classified: Secondary | ICD-10-CM | POA: Diagnosis not present

## 2014-06-01 DIAGNOSIS — M6281 Muscle weakness (generalized): Secondary | ICD-10-CM | POA: Diagnosis not present

## 2014-06-01 DIAGNOSIS — Z471 Aftercare following joint replacement surgery: Secondary | ICD-10-CM | POA: Diagnosis not present

## 2014-06-01 DIAGNOSIS — Z96612 Presence of left artificial shoulder joint: Secondary | ICD-10-CM | POA: Diagnosis not present

## 2014-06-01 NOTE — Therapy (Signed)
[facility] [address] [place], [place], [address] Phone: [phone]   Fax:  [phone]  Occupational Therapy Treatment  Patient Details  Name: Heather [name] MRN: [id number] Date of Birth: [birth year] Referring Provider:  Romeo Apple  Encounter Date: 06/01/2014      OT End of Session - 06/01/14 1457    Visit Number 5   Number of Visits 24   Date for OT Re-Evaluation 07/17/14  mini reassess 4/13   Authorization Type [employer]   Authorization Time Period no authorization required update g code 10th visit   Authorization - Visit Number 5   Authorization - Number of Visits 10   OT Start Time 1305   OT Stop Time 1345   OT Time Calculation (min) 40 min   Activity Tolerance Patient tolerated treatment well   Behavior During Therapy [facility] for tasks assessed/performed      Past Medical History  Diagnosis Date  . HTN (hypertension)   . COPD (chronic obstructive pulmonary disease)   . GERD (gastroesophageal reflux disease)   . Glaucoma   . Anxiety   . Depression   . Anemia   . Arthritis   . Gout     Past Surgical History  Procedure Laterality Date  . Joint replacement Bilateral     hip   . Joint replacement Right     knee  . Shoulder arthroscopy Right   . Abdominal hysterectomy    . Foot surgery Left     repair of "kissing Cousins"  . Bunionectomy Bilateral   . Back surgery      lumbar-disc  . Shoulder hemi-arthroplasty Left 03/25/2014    Procedure: LEFT SHOULDER HEMI-ARTHROPLASTY;  Surgeon: Vickki Hearing, MD;  Location: [facility];  Service: Orthopedics;  Laterality: Left;    There were no vitals filed for this visit.  Visit Diagnosis:  Shoulder pain, left  Limited joint range of motion      Subjective Assessment - 06/01/14 1305    Symptoms S:  It doesnt hurt all of the time, but when it does, it comes with a force.   Limitations AAROM through 3/16 then progress to AROM as able   Currently in Pain? Yes   Pain Score 4    Pain Location Shoulder   Pain Orientation Left   Pain Descriptors / Indicators Aching   Pain Type Acute pain   Pain Radiating Towards from shoulder joint to her elbow   Pain Onset More than a month ago   Pain Frequency Intermittent   Aggravating Factors  use   Pain Relieving Factors rest and medication            [facility] OT Assessment - 06/01/14 0001    Assessment   Diagnosis S/P Left Total Shoulder Replacement   Onset Date 03/25/14   Precautions   Precautions Shoulder   Type of Shoulder Precautions AAROM through 05/20/14 progressing to AROM as able                OT Treatments/Exercises (OP) - 06/01/14 0001    Exercises   Exercises Shoulder   Shoulder Exercises: Supine   Protraction PROM;10 reps;AAROM;12 reps   Horizontal ABduction PROM;10 reps;AAROM;12 reps   External Rotation PROM;5 reps;AAROM;12 reps   Internal Rotation PROM;5 reps;AAROM;12 reps   Flexion PROM;10 reps;AAROM;12 reps   ABduction PROM;5 reps;AAROM;12 reps   Other Supine Exercises serratus anterior punch 10 times   Shoulder Exercises: Seated   Protraction AAROM;10 reps  External Rotation AAROM;10 reps   External Rotation Limitations min tactile cues for positioning   Flexion AAROM;10 reps   Flexion Limitations min-mod tactile cues to depress shoulder blade while flexing arm   Shoulder Exercises: Standing   External Rotation Theraband;10 reps   Theraband Level (Shoulder External Rotation) Level 2 (Red)   Internal Rotation Theraband;10 reps   Theraband Level (Shoulder Internal Rotation) Level 2 (Red)   Extension Theraband;10 reps   Theraband Level (Shoulder Extension) Level 2 (Red)   Row Theraband;10 reps   Theraband Level (Shoulder Row) Level 2 (Red)   Shoulder Elevation AROM;10 reps   Shoulder Exercises: ROM/Strengthening   Proximal Shoulder Strengthening, Supine 10 times therapist unweighted arm   Functional Reaching Activities   Low Level seated at table patient reached  out on table to pick up 25 sponges and place them into a box held by therapist, causing patient to reach  into flexion and abduction.  Patient has max difficulty reaching above 45 degrees of flexion or abduction and requires max tactile and verbal cues to depress shoulder blade while reaching.     Manual Therapy   Manual Therapy Myofascial release   Myofascial Release Myofascial release to left upper arm, trapezius, and scapularis region to decrease joint mobility and increase joint mobility in a pain free zone. Muscle energy technique to left medial deltoid to relax tone and muscle spasm and improve range of motion.                 OT Education - 06/01/14 1456    Education provided Yes   Education Details functional reaching activities - encouraged patient to use her right arm to "unweight" her left arm when reaching out in front or to the side of her body and to depress shoulder blade   Person(s) Educated Patient   Methods Explanation;Demonstration   Comprehension Verbalized understanding          OT Short Term Goals - 05/21/14 1329    OT SHORT TERM GOAL #1   Title Patient will be educated on HEP.   Status On-going   OT SHORT TERM GOAL #2   Title Patient will have WFL PROM in her left shoulder in order to don and doff shirts and coats with increased ease.   Status On-going   OT SHORT TERM GOAL #3   Title Patient will improve left shoulder strength to 3+/5 for increased ability to lift bags of groceries.    Status On-going   OT SHORT TERM GOAL #4   Title Patient will decrease pain to 4/10 or better in left shoulder during functional activities.    Status On-going   OT SHORT TERM GOAL #5   Title Patient will decrease fascial restrictions to moderate in her left shoulder.   Status On-going           OT Long Term Goals - 05/21/14 1329    OT LONG TERM GOAL #1   Title Patient will return to prior level of independence with all functional activities and leisure  activities.    Status On-going   OT LONG TERM GOAL #2   Title Patient will have WFL AROM in her left shoulder for increased ability to braid and wash her hair.   Status On-going   OT LONG TERM GOAL #3   Title Patient will improve left shoulder strength to 4+/5 for increased ability to lift pots an pans when cooking.    Status On-going   OT LONG TERM GOAL #4  Title Patient will decrease pain to 2/10 in left shoulder while completing functional activities.    Status On-going   OT LONG TERM GOAL #5   Title Patient will decrease fascial restrictions in her left shoulder to minimal.   Status On-going               Plan - 06/01/14 1458    Clinical Impression Statement A:  Patient has max difficulty reaching out in front of her body at table height unless she compensates with shoulder elevation.  Required mod - max tactile cues to depress shoulder blade during this task.     Plan P:  decrease amount of facilitation required to depress shoulder blade during reaching activity to minimal.          Problem List Patient Active Problem List   Diagnosis Date Noted  . HTN (hypertension) 04/17/2014  . Dry eyes 04/14/2014  . Bleeding from the nose 04/01/2014  . Renal insufficiency 04/01/2014  . Rotator cuff arthropathy   . Muscle weakness (generalized) 05/26/2013  . Pain in joint, shoulder region 05/26/2013  . Rotator cuff syndrome 05/22/2013    Shirlean Mylar, OTR/L [phone]  06/01/2014, 3:00 PM  [place] [facility] [address] [place], [place], [address] Phone: [phone]   Fax:  [phone]

## 2014-06-04 ENCOUNTER — Ambulatory Visit (HOSPITAL_COMMUNITY): Payer: Commercial Managed Care - HMO | Admitting: Occupational Therapy

## 2014-06-04 ENCOUNTER — Encounter (HOSPITAL_COMMUNITY): Payer: Self-pay | Admitting: Occupational Therapy

## 2014-06-04 DIAGNOSIS — M25512 Pain in left shoulder: Secondary | ICD-10-CM

## 2014-06-04 DIAGNOSIS — M6281 Muscle weakness (generalized): Secondary | ICD-10-CM

## 2014-06-04 DIAGNOSIS — Z471 Aftercare following joint replacement surgery: Secondary | ICD-10-CM | POA: Diagnosis not present

## 2014-06-04 DIAGNOSIS — M2560 Stiffness of unspecified joint, not elsewhere classified: Secondary | ICD-10-CM

## 2014-06-04 DIAGNOSIS — Z96612 Presence of left artificial shoulder joint: Secondary | ICD-10-CM | POA: Diagnosis not present

## 2014-06-04 DIAGNOSIS — M25612 Stiffness of left shoulder, not elsewhere classified: Secondary | ICD-10-CM | POA: Diagnosis not present

## 2014-06-04 NOTE — Therapy (Signed)
[place], [place], [address] Phone: [phone]   Fax:  [phone]  Occupational Therapy Treatment  Patient Details  Name: Heather [name] MRN: [id number] Date of Birth: February 20, 1941 Referring Provider:  Carole Civil, MD  Encounter Date: 06/04/2014      OT End of Session - 06/04/14 1437    Visit Number 6   Number of Visits 24   Date for OT Re-Evaluation 07/17/14  mini reassess 4/13   Authorization Type [employer]   Authorization Time Period no authorization required update g code 10th visit   Authorization - Visit Number 6   Authorization - Number of Visits 10   OT Start Time 1345   OT Stop Time 1430   OT Time Calculation (min) 45 min   Activity Tolerance Patient tolerated treatment well   Behavior During Therapy [facility] for tasks assessed/performed      Past Medical History  Diagnosis Date  . HTN (hypertension)   . COPD (chronic obstructive pulmonary disease)   . GERD (gastroesophageal reflux disease)   . Glaucoma   . Anxiety   . Depression   . Anemia   . Arthritis   . Gout     Past Surgical History  Procedure Laterality Date  . Joint replacement Bilateral     hip   . Joint replacement Right     knee  . Shoulder arthroscopy Right   . Abdominal hysterectomy    . Foot surgery Left     repair of "kissing Cousins"  . Bunionectomy Bilateral   . Back surgery      lumbar-disc  . Shoulder hemi-arthroplasty Left 03/25/2014    Procedure: LEFT SHOULDER HEMI-ARTHROPLASTY;  Surgeon: Carole Civil, MD;  Location: [facility];  Service: Orthopedics;  Laterality: Left;    There were no vitals filed for this visit.  Visit Diagnosis:  Shoulder pain, left  Limited joint range of motion  Decreased muscle strength      Subjective Assessment - 06/04/14 1344    Symptoms S: The pain is about the same as it usually is.    Currently in Pain? Yes   Pain Score 4    Pain Location Shoulder   Pain  Orientation Left   Pain Descriptors / Indicators Aching   Pain Type Acute pain            [facility] OT Assessment - 06/04/14 1406    Assessment   Diagnosis S/P Left Total Shoulder Replacement   Precautions   Precautions Shoulder   Type of Shoulder Precautions AAROM through 05/20/14 progressing to AROM as able                OT Treatments/Exercises (OP) - 06/04/14 1404    Exercises   Exercises Shoulder   Shoulder Exercises: Supine   Protraction PROM;10 reps;AAROM;12 reps   Horizontal ABduction PROM;10 reps;AAROM;12 reps   External Rotation PROM;5 reps;AAROM;12 reps   Internal Rotation PROM;5 reps;AAROM;12 reps   Flexion PROM;10 reps;AAROM;12 reps   ABduction PROM;5 reps;AAROM;12 reps   ABduction Limitations 1 rest break, <50% range   Shoulder Exercises: Seated   Protraction AAROM;10 reps   External Rotation AAROM;10 reps   Internal Rotation AAROM;10 reps   Flexion AAROM;10 reps   Flexion Limitations min-mod tactile cues to depress shoulder blade while flexing arm   Shoulder Exercises: ROM/Strengthening   Thumb Tacks 1'   Proximal Shoulder Strengthening, Supine 10X therapist unweighted arm   Prot/Ret//Elev/Dep 1'  Functional Reaching Activities   Low Level Seated at table, pt reached in abduction to grasp pegs and then reached into flexion to place pegs in foam board. Pt demonstrates mod-max difficulty reaching above 45 degrees flex/abd, required mod verbal cuing to depress shoulder during reaching task.    Manual Therapy   Manual Therapy Myofascial release   Myofascial Release Myofascial release to left upper arm, trapezius, and scapularis region to decrease joint mobility and increase joint mobility in a pain free zone. Muscle energy technique to left medial deltoid to relax tone and muscle spasm and improve range of motion.                   OT Short Term Goals - 05/21/14 1329    OT SHORT TERM GOAL #1   Title Patient will be educated on HEP.   Status  On-going   OT SHORT TERM GOAL #2   Title Patient will have WFL PROM in her left shoulder in order to don and doff shirts and coats with increased ease.   Status On-going   OT SHORT TERM GOAL #3   Title Patient will improve left shoulder strength to 3+/5 for increased ability to lift bags of groceries.    Status On-going   OT SHORT TERM GOAL #4   Title Patient will decrease pain to 4/10 or better in left shoulder during functional activities.    Status On-going   OT SHORT TERM GOAL #5   Title Patient will decrease fascial restrictions to moderate in her left shoulder.   Status On-going           OT Long Term Goals - 05/21/14 1329    OT LONG TERM GOAL #1   Title Patient will return to prior level of independence with all functional activities and leisure activities.    Status On-going   OT LONG TERM GOAL #2   Title Patient will have WFL AROM in her left shoulder for increased ability to braid and wash her hair.   Status On-going   OT LONG TERM GOAL #3   Title Patient will improve left shoulder strength to 4+/5 for increased ability to lift pots an pans when cooking.    Status On-going   OT LONG TERM GOAL #4   Title Patient will decrease pain to 2/10 in left shoulder while completing functional activities.    Status On-going   OT LONG TERM GOAL #5   Title Patient will decrease fascial restrictions in her left shoulder to minimal.   Status On-going               Plan - 06/04/14 1438    Clinical Impression Statement A: Pt demonstrates improved reaching this date, pt able to complete functional reaching task with mod verbal cuing to depress shoulder. Pt with max difficulty reaching above 45 degrees flex/abd. Resumed thumb tacks & pro/ret/elev/dep. Pt tolerated treatment well.    Plan P: Resume pulleys, focus on proper form during seated/standing exercises.         Problem List Patient Active Problem List   Diagnosis Date Noted  . HTN (hypertension) 04/17/2014  . Dry  eyes 04/14/2014  . Bleeding from the nose 04/01/2014  . Renal insufficiency 04/01/2014  . Rotator cuff arthropathy   . Muscle weakness (generalized) 05/26/2013  . Pain in joint, shoulder region 05/26/2013  . Rotator cuff syndrome 05/22/2013    Guadelupe Sabin, OTR/L  [phone]  06/04/2014, 2:40 PM  [place]  [place], [place], [address] Phone: [phone]   Fax:  [phone]

## 2014-06-08 ENCOUNTER — Encounter (HOSPITAL_COMMUNITY): Payer: Self-pay

## 2014-06-08 ENCOUNTER — Ambulatory Visit (HOSPITAL_COMMUNITY): Payer: Commercial Managed Care - HMO | Attending: Orthopedic Surgery

## 2014-06-08 DIAGNOSIS — M25612 Stiffness of left shoulder, not elsewhere classified: Secondary | ICD-10-CM | POA: Insufficient documentation

## 2014-06-08 DIAGNOSIS — Z471 Aftercare following joint replacement surgery: Secondary | ICD-10-CM | POA: Diagnosis not present

## 2014-06-08 DIAGNOSIS — M6281 Muscle weakness (generalized): Secondary | ICD-10-CM | POA: Insufficient documentation

## 2014-06-08 DIAGNOSIS — Z96612 Presence of left artificial shoulder joint: Secondary | ICD-10-CM | POA: Diagnosis not present

## 2014-06-08 DIAGNOSIS — M25512 Pain in left shoulder: Secondary | ICD-10-CM

## 2014-06-08 DIAGNOSIS — M2560 Stiffness of unspecified joint, not elsewhere classified: Secondary | ICD-10-CM

## 2014-06-08 NOTE — Therapy (Signed)
[place], [place], [address] Phone: [phone]   Fax:  [phone]  Occupational Therapy Treatment  Patient Details  Name: Heather [name] MRN: [id number] Date of Birth: 1940/11/26 Referring Provider:  Carole Civil, MD  Encounter Date: 06/08/2014      OT End of Session - 06/08/14 1355    Visit Number 7   Number of Visits 24   Date for OT Re-Evaluation 07/17/14  mini reassess 4/13   Authorization Type [employer]   Authorization Time Period no authorization required update g code 10th visit   Authorization - Visit Number 7   Authorization - Number of Visits 10   OT Start Time 1300   OT Stop Time 1345   OT Time Calculation (min) 45 min   Activity Tolerance Patient tolerated treatment well   Behavior During Therapy [facility] for tasks assessed/performed      Past Medical History  Diagnosis Date  . HTN (hypertension)   . COPD (chronic obstructive pulmonary disease)   . GERD (gastroesophageal reflux disease)   . Glaucoma   . Anxiety   . Depression   . Anemia   . Arthritis   . Gout     Past Surgical History  Procedure Laterality Date  . Joint replacement Bilateral     hip   . Joint replacement Right     knee  . Shoulder arthroscopy Right   . Abdominal hysterectomy    . Foot surgery Left     repair of "kissing Cousins"  . Bunionectomy Bilateral   . Back surgery      lumbar-disc  . Shoulder hemi-arthroplasty Left 03/25/2014    Procedure: LEFT SHOULDER HEMI-ARTHROPLASTY;  Surgeon: Carole Civil, MD;  Location: [facility];  Service: Orthopedics;  Laterality: Left;    There were no vitals filed for this visit.  Visit Diagnosis:  Shoulder pain, left  Limited joint range of motion  Decreased muscle strength      Subjective Assessment - 06/08/14 1353    Subjective  S: Pt stated "It isn't popping like it was last time."   Currently in Pain? Yes   Pain Score 4    Pain Location Shoulder    Pain Orientation Left   Pain Descriptors / Indicators Aching            [facility] OT Assessment - 06/08/14 1259    Assessment   Diagnosis S/P Left Total Shoulder Replacement   Precautions   Precautions Shoulder   Type of Shoulder Precautions AAROM through 05/20/14 progressing to AROM as able                OT Treatments/Exercises (OP) - 06/08/14 1300    Exercises   Exercises Shoulder   Shoulder Exercises: Supine   Protraction PROM;5 reps;AAROM;12 reps   Horizontal ABduction PROM;5 reps;AAROM;12 reps   External Rotation PROM;5 reps;AAROM;12 reps   Internal Rotation PROM;5 reps;AAROM;12 reps   Flexion PROM;5 reps;AAROM;12 reps   Flexion Limitations able to achieve 80% range with AAROM.   ABduction PROM;5 reps;AAROM;12 reps   ABduction Limitations able to achieve 50% range.   Shoulder Exercises: Seated   Protraction AAROM;10 reps   Horizontal ABduction AAROM;10 reps   External Rotation AAROM;10 reps   Internal Rotation AAROM;10 reps   Flexion AAROM;10 reps   Flexion Limitations able to perform to shoulder height.   Abduction AAROM;10 reps   ABduction Limitations <50% range   Shoulder Exercises: Pulleys   Flexion  1 minute   ABduction 1 minute   Shoulder Exercises: ROM/Strengthening   Thumb Tacks 1'   Prot/Ret//Elev/Dep 1'   Manual Therapy   Manual Therapy Myofascial release   Myofascial Release Myofascial release to left upper arm, trapezius, and scapularis region to decrease joint mobility and increase joint mobility in a pain free zone.                  OT Short Term Goals - 05/21/14 1329    OT SHORT TERM GOAL #1   Title Patient will be educated on HEP.   Status On-going   OT SHORT TERM GOAL #2   Title Patient will have WFL PROM in her left shoulder in order to don and doff shirts and coats with increased ease.   Status On-going   OT SHORT TERM GOAL #3   Title Patient will improve left shoulder strength to 3+/5 for increased ability to lift bags  of groceries.    Status On-going   OT SHORT TERM GOAL #4   Title Patient will decrease pain to 4/10 or better in left shoulder during functional activities.    Status On-going   OT SHORT TERM GOAL #5   Title Patient will decrease fascial restrictions to moderate in her left shoulder.   Status On-going           OT Long Term Goals - 05/21/14 1329    OT LONG TERM GOAL #1   Title Patient will return to prior level of independence with all functional activities and leisure activities.    Status On-going   OT LONG TERM GOAL #2   Title Patient will have WFL AROM in her left shoulder for increased ability to braid and wash her hair.   Status On-going   OT LONG TERM GOAL #3   Title Patient will improve left shoulder strength to 4+/5 for increased ability to lift pots an pans when cooking.    Status On-going   OT LONG TERM GOAL #4   Title Patient will decrease pain to 2/10 in left shoulder while completing functional activities.    Status On-going   OT LONG TERM GOAL #5   Title Patient will decrease fascial restrictions in her left shoulder to minimal.   Status On-going               Plan - 06/08/14 1356    Clinical Impression Statement A: Pt continues to demonstrate limited ROM, moreso during AAROM.  Pt tolerated exercises with experiencing slight increase in pain occasionally during AAROM seated.  Pt experience a quick small "catch" once when performing seated AAROM horizontal abduction.   Plan P: Continue PROM/AAROM exercises to increase ROM.  Continue focusing on proper form during exercises. Add wall wash if able to tolerate.        Problem List Patient Active Problem List   Diagnosis Date Noted  . HTN (hypertension) 04/17/2014  . Dry eyes 04/14/2014  . Bleeding from the nose 04/01/2014  . Renal insufficiency 04/01/2014  . Rotator cuff arthropathy   . Muscle weakness (generalized) 05/26/2013  . Pain in joint, shoulder region 05/26/2013  . Rotator cuff syndrome  05/22/2013    Elba Barman, OTA Student [id number] 06/08/2014, 2:04 PM  [place] [address] [place], [place], [address] Phone: [phone]   Fax:  [phone]

## 2014-06-10 ENCOUNTER — Ambulatory Visit (HOSPITAL_COMMUNITY): Payer: Commercial Managed Care - HMO

## 2014-06-10 ENCOUNTER — Encounter (HOSPITAL_COMMUNITY): Payer: Self-pay

## 2014-06-10 ENCOUNTER — Other Ambulatory Visit: Payer: Self-pay | Admitting: *Deleted

## 2014-06-10 ENCOUNTER — Telehealth: Payer: Self-pay | Admitting: Orthopedic Surgery

## 2014-06-10 DIAGNOSIS — M25512 Pain in left shoulder: Secondary | ICD-10-CM

## 2014-06-10 DIAGNOSIS — Z471 Aftercare following joint replacement surgery: Secondary | ICD-10-CM | POA: Diagnosis not present

## 2014-06-10 DIAGNOSIS — Z96612 Presence of left artificial shoulder joint: Secondary | ICD-10-CM | POA: Diagnosis not present

## 2014-06-10 DIAGNOSIS — M25612 Stiffness of left shoulder, not elsewhere classified: Secondary | ICD-10-CM | POA: Diagnosis not present

## 2014-06-10 DIAGNOSIS — M2560 Stiffness of unspecified joint, not elsewhere classified: Secondary | ICD-10-CM

## 2014-06-10 DIAGNOSIS — M6281 Muscle weakness (generalized): Secondary | ICD-10-CM

## 2014-06-10 MED ORDER — ACETAMINOPHEN-CODEINE #3 300-30 MG PO TABS: 1.0000 | ORAL_TABLET | ORAL | Status: DC | PRN

## 2014-06-10 NOTE — Telephone Encounter (Signed)
Patient is requesting pain medication acetaminophen-codeine (TYLENOL #3) 300-30 MG per tablet please advise?

## 2014-06-10 NOTE — Telephone Encounter (Signed)
Prescription is available for pick up, patient is aware  

## 2014-06-10 NOTE — Therapy (Signed)
[facility] [address] [place], [place], [address] Phone: [phone]   Fax:  [phone]  Occupational Therapy Reassessment and Treatment  Patient Details  Name: Heather [name] MRN: [id number] Date of Birth: 02-14-1941 Referring Provider:  Vickki Hearing, MD  Encounter Date: 06/10/2014      OT End of Session - 06/10/14 1458    Visit Number 8   Number of Visits 24   Date for OT Re-Evaluation 07/17/14   Authorization Type [employer]   Authorization Time Period no authorization required update g code 18th visit   Authorization - Visit Number 8   Authorization - Number of Visits 18   OT Start Time 1300   OT Stop Time 1345   OT Time Calculation (min) 45 min   Activity Tolerance Patient tolerated treatment well   Behavior During Therapy [facility] for tasks assessed/performed      Past Medical History  Diagnosis Date  . HTN (hypertension)   . COPD (chronic obstructive pulmonary disease)   . GERD (gastroesophageal reflux disease)   . Glaucoma   . Anxiety   . Depression   . Anemia   . Arthritis   . Gout     Past Surgical History  Procedure Laterality Date  . Joint replacement Bilateral     hip   . Joint replacement Right     knee  . Shoulder arthroscopy Right   . Abdominal hysterectomy    . Foot surgery Left     repair of "kissing Cousins"  . Bunionectomy Bilateral   . Back surgery      lumbar-disc  . Shoulder hemi-arthroplasty Left 03/25/2014    Procedure: LEFT SHOULDER HEMI-ARTHROPLASTY;  Surgeon: Vickki Hearing, MD;  Location: [facility];  Service: Orthopedics;  Laterality: Left;    There were no vitals filed for this visit.  Visit Diagnosis:  Shoulder pain, left  Limited joint range of motion  Decreased muscle strength      Subjective Assessment - 06/10/14 1453    Subjective  S: I feel a little depressed sometimes because I am not progressing as quickly as I want to be.    Currently in Pain? Yes   Pain Score 4    Pain Location Shoulder   Pain Orientation Left   Pain Descriptors / Indicators Aching   Pain Type Acute pain           [facility] OT Assessment - 06/10/14 1318    Assessment   Diagnosis S/P Left Total Shoulder Replacement   Precautions   Precautions Shoulder   Type of Shoulder Precautions AAROM through 05/20/14 progressing to AROM as able   AROM   Overall AROM Comments assessed in supine, ER/IR with shoulder adducted   Left Shoulder Flexion 128 Degrees  on eval: 40   Left Shoulder ABduction 94 Degrees  on eval: 44   Left Shoulder Internal Rotation 90 Degrees  on eval: 76   Left Shoulder External Rotation 55 Degrees  on eval: 30   PROM   Overall PROM Comments assessed in supine, ER/IR with shoulder adducted   Left Shoulder Flexion 132 Degrees  on eval: 88   Left Shoulder ABduction 95 Degrees  on eval: 55   Left Shoulder Internal Rotation 90 Degrees  on eval: 76   Left Shoulder External Rotation 71 Degrees  on eval: 35   Strength   Overall Strength Comments assessed seated. IR/ER adducted   Right/Left Shoulder Left   Left Shoulder Flexion  3-/5   Left Shoulder ABduction 3-/5   Left Shoulder Internal Rotation 3/5   Left Shoulder External Rotation 3+/5             [name] - 06/10/14 1335    Open a tight or new jar Mild difficulty   Do heavy household chores (wash walls, wash floors) Moderate difficulty   Carry a shopping bag or briefcase Moderate difficulty   Wash your back Severe difficulty   Use a knife to cut food Unable   Recreational activities in which you take some force or impact through your arm, shoulder, or hand (golf, hammering, tennis) Moderate difficulty   During the past week, to what extent has your arm, shoulder or hand problem interfered with your normal social activities with family, friends, neighbors, or groups? Not at all   During the past week, to what extent has your arm, shoulder or hand problem limited your work or other  regular daily activities Not at all   Arm, shoulder, or hand pain. Moderate   Tingling (pins and needles) in your arm, shoulder, or hand None   Difficulty Sleeping Mild difficulty   DASH Score 38.64 %             OT Treatments/Exercises (OP) - 06/10/14 1325    Exercises   Exercises Shoulder   Shoulder Exercises: Supine   Protraction PROM;5 reps;AROM;10 reps   Horizontal ABduction PROM;5 reps;AROM;10 reps   External Rotation PROM;5 reps;AROM;10 reps   Internal Rotation PROM;5 reps;AROM;10 reps   Flexion PROM;5 reps;AROM;10 reps   ABduction PROM;5 reps;AROM;10 reps   Manual Therapy   Manual Therapy Myofascial release   Myofascial Release Myofascial release to left upper arm, trapezius, and scapularis region to decrease joint mobility and increase joint mobility in a pain free zone.Muscle energy technique to left anteroir deltoid to relax tone and muscle spasm and improve range of motion.                  OT Short Term Goals - 06/10/14 1342    OT SHORT TERM GOAL #1   Title Patient will be educated on HEP.   Status Achieved   OT SHORT TERM GOAL #2   Title Patient will have WFL PROM in her left shoulder in order to don and doff shirts and coats with increased ease.   Status Achieved   OT SHORT TERM GOAL #3   Title Patient will improve left shoulder strength to 3+/5 for increased ability to lift bags of groceries.    Status On-going   OT SHORT TERM GOAL #4   Title Patient will decrease pain to 4/10 or better in left shoulder during functional activities.    Status On-going   OT SHORT TERM GOAL #5   Title Patient will decrease fascial restrictions to moderate in her left shoulder.   Status Achieved           OT Long Term Goals - 05/21/14 1329    OT LONG TERM GOAL #1   Title Patient will return to prior level of independence with all functional activities and leisure activities.    Status On-going   OT LONG TERM GOAL #2   Title Patient will have WFL AROM in  her left shoulder for increased ability to braid and wash her hair.   Status On-going   OT LONG TERM GOAL #3   Title Patient will improve left shoulder strength to 4+/5 for increased ability to lift pots an pans when cooking.  Status On-going   OT LONG TERM GOAL #4   Title Patient will decrease pain to 2/10 in left shoulder while completing functional activities.    Status On-going   OT LONG TERM GOAL #5   Title Patient will decrease fascial restrictions in her left shoulder to minimal.   Status On-going                 G-Codes - 06/15/2014 1459    Functional Assessment Tool Used DASH score: 38.64% impaired   Functional Limitation Carrying, moving and handling objects   Carrying, Moving and Handling Objects Current Status (J0122) At least 20 percent but less than 40 percent impaired, limited or restricted   Carrying, Moving and Handling Objects Goal Status (U4114) At least 1 percent but less than 20 percent impaired, limited or restricted      Problem List Patient Active Problem List   Diagnosis Date Noted  . HTN (hypertension) 04/17/2014  . Dry eyes 04/14/2014  . Bleeding from the nose 04/01/2014  . Renal insufficiency 04/01/2014  . Rotator cuff arthropathy   . Muscle weakness (generalized) 05/26/2013  . Pain in joint, shoulder region 05/26/2013  . Rotator cuff syndrome 05/22/2013    Limmie Patricia, OTR/L,CBIS  [id number]  2014/06/15, 3:02 PM  [facility] [address] [place], [place], [address] Phone: [phone]   Fax:  [phone]

## 2014-06-11 NOTE — Telephone Encounter (Signed)
Patient Picked Up Rx

## 2014-06-16 ENCOUNTER — Ambulatory Visit (INDEPENDENT_AMBULATORY_CARE_PROVIDER_SITE_OTHER): Payer: Self-pay | Admitting: Orthopedic Surgery

## 2014-06-16 VITALS — Ht 63.0 in | Wt 197.0 lb

## 2014-06-16 DIAGNOSIS — Z96612 Presence of left artificial shoulder joint: Secondary | ICD-10-CM

## 2014-06-16 NOTE — Progress Notes (Signed)
Patient ID: Heather [name], female   DOB: 02/04/1941, 74 y.o.   MRN: [id number] Chief Complaint  Patient presents with  . Follow-up    6 week follow up left shoulder partial replacement, DOS 03/25/14    Weeks ago she's 3 months out from a left shoulder hemi-replacement. She is doing well in terms of pain relief. Her range of motion is 45 of external rotation 100 of forward elevation in the scapular plane  Overall pain is minimal  Continue strengthening exercises come back in 3 months

## 2014-06-17 ENCOUNTER — Ambulatory Visit (HOSPITAL_COMMUNITY): Payer: Commercial Managed Care - HMO

## 2014-06-17 ENCOUNTER — Encounter (HOSPITAL_COMMUNITY): Payer: Self-pay

## 2014-06-17 DIAGNOSIS — M2560 Stiffness of unspecified joint, not elsewhere classified: Secondary | ICD-10-CM

## 2014-06-17 DIAGNOSIS — Z471 Aftercare following joint replacement surgery: Secondary | ICD-10-CM | POA: Diagnosis not present

## 2014-06-17 DIAGNOSIS — M6281 Muscle weakness (generalized): Secondary | ICD-10-CM

## 2014-06-17 DIAGNOSIS — M25512 Pain in left shoulder: Secondary | ICD-10-CM

## 2014-06-17 DIAGNOSIS — Z96612 Presence of left artificial shoulder joint: Secondary | ICD-10-CM | POA: Diagnosis not present

## 2014-06-17 DIAGNOSIS — M25612 Stiffness of left shoulder, not elsewhere classified: Secondary | ICD-10-CM | POA: Diagnosis not present

## 2014-06-17 NOTE — Therapy (Signed)
[facility] [address] [place], [place], [address] Phone: [phone]   Fax:  [phone]  Occupational Therapy Treatment  Patient Details  Name: Heather [name] MRN: [id number] Date of Birth: Dec 17, 1940 Referring Provider:  Vickki Hearing, MD  Encounter Date: 06/17/2014      OT End of Session - 06/17/14 1355    Visit Number 9   Number of Visits 24   Date for OT Re-Evaluation 07/17/14   Authorization Type [employer]   Authorization Time Period no authorization required update g code 18th visit   Authorization - Visit Number 9   Authorization - Number of Visits 18   OT Start Time 1300   OT Stop Time 1345   OT Time Calculation (min) 45 min   Activity Tolerance Patient tolerated treatment well   Behavior During Therapy [facility] for tasks assessed/performed      Past Medical History  Diagnosis Date  . HTN (hypertension)   . COPD (chronic obstructive pulmonary disease)   . GERD (gastroesophageal reflux disease)   . Glaucoma   . Anxiety   . Depression   . Anemia   . Arthritis   . Gout     Past Surgical History  Procedure Laterality Date  . Joint replacement Bilateral     hip   . Joint replacement Right     knee  . Shoulder arthroscopy Right   . Abdominal hysterectomy    . Foot surgery Left     repair of "kissing Cousins"  . Bunionectomy Bilateral   . Back surgery      lumbar-disc  . Shoulder hemi-arthroplasty Left 03/25/2014    Procedure: LEFT SHOULDER HEMI-ARTHROPLASTY;  Surgeon: Vickki Hearing, MD;  Location: [facility];  Service: Orthopedics;  Laterality: Left;    There were no vitals filed for this visit.  Visit Diagnosis:  Shoulder pain, left  Limited joint range of motion  Decreased muscle strength      Subjective Assessment - 06/17/14 1302    Subjective  S: Pt stated "I took my hair down, it took some time but I accomplished it."   Currently in Pain? Yes   Pain Score 2    Pain Location Shoulder    Pain Orientation Left            [facility] OT Assessment - 06/17/14 1258    Assessment   Diagnosis S/P Left Total Shoulder Replacement   Precautions   Precautions Shoulder   Type of Shoulder Precautions AAROM through 05/20/14 progressing to AROM as able                OT Treatments/Exercises (OP) - 06/17/14 1259    Exercises   Exercises Shoulder   Shoulder Exercises: Supine   Protraction PROM;5 reps;AROM;12 reps   Horizontal ABduction PROM;5 reps;AROM;12 reps   External Rotation PROM;5 reps;AROM;12 reps   Internal Rotation PROM;5 reps;AROM;12 reps   Flexion PROM;5 reps;AROM;12 reps   Flexion Limitations needed 1 rest break   ABduction PROM;5 reps;AROM;12 reps   ABduction Limitations able to achieve 50% range.   Shoulder Exercises: Seated   Protraction AAROM;12 reps   Horizontal ABduction AAROM;10 reps   Horizontal ABduction Limitations shoulder presented with catching 2 times.   External Rotation AAROM;12 reps   Internal Rotation AAROM;12 reps   Flexion AAROM;12 reps   Flexion Limitations required assistance for shoulder to help maintain proper form.   Abduction AAROM;12 reps   ABduction Limitations <50% range   Shoulder  Exercises: Standing   Extension Theraband;10 reps   Theraband Level (Shoulder Extension) Level 2 (Red)   Row Theraband;10 reps   Theraband Level (Shoulder Row) Level 2 (Red)   Retraction Theraband;10 reps   Theraband Level (Shoulder Retraction) Level 2 (Red)   Retraction Weight (lbs) Needed physical assistance to maintain proper form with shoulder.   Shoulder Exercises: ROM/Strengthening   Wall Wash 1'   Wall Pushups 10 reps   Manual Therapy   Manual Therapy Myofascial release   Myofascial Release Myofascial release to left upper arm, trapezius, and scapularis region to decrease joint mobility and increase joint mobility in a pain free zone.                  OT Short Term Goals - 06/17/14 1640    OT SHORT TERM GOAL #1   Title  Patient will be educated on HEP.   OT SHORT TERM GOAL #2   Title Patient will have WFL PROM in her left shoulder in order to don and doff shirts and coats with increased ease.   OT SHORT TERM GOAL #3   Title Patient will improve left shoulder strength to 3+/5 for increased ability to lift bags of groceries.    Status On-going   OT SHORT TERM GOAL #4   Title Patient will decrease pain to 4/10 or better in left shoulder during functional activities.    Status On-going   OT SHORT TERM GOAL #5   Title Patient will decrease fascial restrictions to moderate in her left shoulder.           OT Long Term Goals - 06/17/14 1641    OT LONG TERM GOAL #1   Title Patient will return to prior level of independence with all functional activities and leisure activities.    Status On-going   OT LONG TERM GOAL #2   Title Patient will have WFL AROM in her left shoulder for increased ability to braid and wash her hair.   Status On-going   OT LONG TERM GOAL #3   Title Patient will improve left shoulder strength to 4+/5 for increased ability to lift pots an pans when cooking.    Status On-going   OT LONG TERM GOAL #4   Title Patient will decrease pain to 2/10 in left shoulder while completing functional activities.    Status On-going   OT LONG TERM GOAL #5   Title Patient will decrease fascial restrictions in her left shoulder to minimal.   Status On-going               Plan - 06/17/14 1356    Clinical Impression Statement A: Added wall pushups and wall wash to exercises.  Increase AAROM reps for supine and seated.  Pt's shoulder presented with catching 2 times during seated AAROM horizontal abduction.  Pt's shoulder continues to elevate when performing exercises and pt requires physical assistance to help keep shoulder depressed.   Plan P:  Add theraband activities for shoulder flexion, internal/external rotation.        Problem List Patient Active Problem List   Diagnosis Date Noted   . HTN (hypertension) 04/17/2014  . Dry eyes 04/14/2014  . Bleeding from the nose 04/01/2014  . Renal insufficiency 04/01/2014  . Rotator cuff arthropathy   . Muscle weakness (generalized) 05/26/2013  . Pain in joint, shoulder region 05/26/2013  . Rotator cuff syndrome 05/22/2013    Beather Arbour, OTA Student [id number]  06/17/2014, 4:41 PM  [place] [facility]  [facility] [place], [place], [address] Phone: [phone]   Fax:  [phone]

## 2014-06-24 ENCOUNTER — Encounter (HOSPITAL_COMMUNITY): Payer: Self-pay

## 2014-06-24 ENCOUNTER — Ambulatory Visit (HOSPITAL_COMMUNITY): Payer: Commercial Managed Care - HMO

## 2014-06-24 DIAGNOSIS — Z96612 Presence of left artificial shoulder joint: Secondary | ICD-10-CM | POA: Diagnosis not present

## 2014-06-24 DIAGNOSIS — M6281 Muscle weakness (generalized): Secondary | ICD-10-CM

## 2014-06-24 DIAGNOSIS — Z471 Aftercare following joint replacement surgery: Secondary | ICD-10-CM | POA: Diagnosis not present

## 2014-06-24 DIAGNOSIS — M25612 Stiffness of left shoulder, not elsewhere classified: Secondary | ICD-10-CM | POA: Diagnosis not present

## 2014-06-24 DIAGNOSIS — M25512 Pain in left shoulder: Secondary | ICD-10-CM | POA: Diagnosis not present

## 2014-06-24 DIAGNOSIS — M2560 Stiffness of unspecified joint, not elsewhere classified: Secondary | ICD-10-CM

## 2014-06-24 NOTE — Therapy (Signed)
[facility] [address] [place], [place], [address] Phone: [phone]   Fax:  [phone]  Occupational Therapy Treatment  Patient Details  Name: Heather [name] MRN: [id number] Date of Birth: 1940/04/03 Referring Provider:  Vickki Hearing, MD  Encounter Date: 06/24/2014      OT End of Session - 06/24/14 1400    Visit Number 10   Number of Visits 24   Date for OT Re-Evaluation 07/17/14   Authorization Type [employer]   Authorization Time Period no authorization required update g code 18th visit   Authorization - Visit Number 10   Authorization - Number of Visits 18   OT Start Time 1300   OT Stop Time 1354   OT Time Calculation (min) 54 min   Activity Tolerance Patient tolerated treatment well   Behavior During Therapy [facility] for tasks assessed/performed      Past Medical History  Diagnosis Date  . HTN (hypertension)   . COPD (chronic obstructive pulmonary disease)   . GERD (gastroesophageal reflux disease)   . Glaucoma   . Anxiety   . Depression   . Anemia   . Arthritis   . Gout     Past Surgical History  Procedure Laterality Date  . Joint replacement Bilateral     hip   . Joint replacement Right     knee  . Shoulder arthroscopy Right   . Abdominal hysterectomy    . Foot surgery Left     repair of "kissing Cousins"  . Bunionectomy Bilateral   . Back surgery      lumbar-disc  . Shoulder hemi-arthroplasty Left 03/25/2014    Procedure: LEFT SHOULDER HEMI-ARTHROPLASTY;  Surgeon: Vickki Hearing, MD;  Location: [facility];  Service: Orthopedics;  Laterality: Left;    There were no vitals filed for this visit.  Visit Diagnosis:  Shoulder pain, left  Limited joint range of motion  Decreased muscle strength      Subjective Assessment - 06/24/14 1306    Subjective  S: Pt stated "It isn't popping like it was the last time."   Currently in Pain? No/denies   Pain Score 0-No pain            [facility] OT  Assessment - 06/24/14 1307    Assessment   Diagnosis S/P Left Total Shoulder Replacement   Precautions   Precautions Shoulder   Type of Shoulder Precautions AAROM through 05/20/14 progressing to AROM as able                  OT Treatments/Exercises (OP) - 06/24/14 1308    Exercises   Exercises Shoulder   Shoulder Exercises: Supine   Protraction PROM;5 reps;AROM;12 reps   Horizontal ABduction PROM;5 reps;AROM;12 reps   External Rotation PROM;5 reps;AROM;12 reps   Internal Rotation PROM;5 reps;AROM;12 reps   Flexion PROM;5 reps;AROM;12 reps   ABduction PROM;5 reps;AROM;12 reps   ABduction Limitations able to achieve 60% range.   Shoulder Exercises: Seated   Protraction AAROM;12 reps   Horizontal ABduction AAROM;12 reps   Horizontal ABduction Limitations required 1 rest break   External Rotation AAROM;12 reps   Internal Rotation AAROM;12 reps   Flexion AAROM;12 reps   Flexion Limitations required assistance for shoulder to help maintain proper form.   Abduction AAROM;12 reps   Shoulder Exercises: Standing   External Rotation Theraband;10 reps   Theraband Level (Shoulder External Rotation) Level 2 (Red)   Internal Rotation Theraband;10 reps   Theraband Level (  Shoulder Internal Rotation) Level 2 (Red)   Flexion Theraband;10 reps   Theraband Level (Shoulder Flexion) Level 2 (Red)   Flexion Limitations Pt's shoulder demonstrated decreased elevation.   Extension Theraband;10 reps   Theraband Level (Shoulder Extension) Level 2 (Red)   Row Theraband;10 reps   Theraband Level (Shoulder Row) Level 2 (Red)   Shoulder Exercises: ROM/Strengthening   Wall Pushups 10 reps   Manual Therapy   Manual Therapy Myofascial release   Myofascial Release Myofascial release to left upper arm, trapezius, and scapularis region to decrease joint mobility and increase joint mobility in a pain free zone.                  OT Short Term Goals - 06/17/14 1640    OT SHORT TERM GOAL #1    Title Patient will be educated on HEP.   OT SHORT TERM GOAL #2   Title Patient will have WFL PROM in her left shoulder in order to don and doff shirts and coats with increased ease.   OT SHORT TERM GOAL #3   Title Patient will improve left shoulder strength to 3+/5 for increased ability to lift bags of groceries.    Status On-going   OT SHORT TERM GOAL #4   Title Patient will decrease pain to 4/10 or better in left shoulder during functional activities.    Status On-going   OT SHORT TERM GOAL #5   Title Patient will decrease fascial restrictions to moderate in her left shoulder.           OT Long Term Goals - 06/17/14 1641    OT LONG TERM GOAL #1   Title Patient will return to prior level of independence with all functional activities and leisure activities.    Status On-going   OT LONG TERM GOAL #2   Title Patient will have WFL AROM in her left shoulder for increased ability to braid and wash her hair.   Status On-going   OT LONG TERM GOAL #3   Title Patient will improve left shoulder strength to 4+/5 for increased ability to lift pots an pans when cooking.    Status On-going   OT LONG TERM GOAL #4   Title Patient will decrease pain to 2/10 in left shoulder while completing functional activities.    Status On-going   OT LONG TERM GOAL #5   Title Patient will decrease fascial restrictions in her left shoulder to minimal.   Status On-going               Plan - 06/24/14 1310    Clinical Impression Statement A: Added theraband exercises for shoulder (flexion, external and internal rotation).  Pt continues to need tactile input to depress shoulder during AAROM exercises while seated.  However, when performing shoulder exercises with theraband, moreso during shoulder flexion, pt's shoulder does not elevate as much as it does during AAROM exercises.  Pt expressed some discomfort throughout treatment, however, pt was able to tolerate exercises well.   Plan P:  Continue  working to improve proper form of shoulder during exercises.  Add theraband scapular and shoulder (flexion, external and internal rotation) exercises to HEP.        Problem List Patient Active Problem List   Diagnosis Date Noted  . HTN (hypertension) 04/17/2014  . Dry eyes 04/14/2014  . Bleeding from the nose 04/01/2014  . Renal insufficiency 04/01/2014  . Rotator cuff arthropathy   . Muscle weakness (generalized) 05/26/2013  . Pain  in joint, shoulder region 05/26/2013  . Rotator cuff syndrome 05/22/2013    Beather Arbour, OTA Student [phone]  06/24/2014, 2:18 PM  [facility] [address] [place], [place], [address] Phone: [phone]   Fax:  [phone]

## 2014-07-01 ENCOUNTER — Ambulatory Visit (HOSPITAL_COMMUNITY): Payer: Commercial Managed Care - HMO | Admitting: Occupational Therapy

## 2014-07-01 ENCOUNTER — Encounter (HOSPITAL_COMMUNITY): Payer: Self-pay | Admitting: Occupational Therapy

## 2014-07-01 DIAGNOSIS — M2560 Stiffness of unspecified joint, not elsewhere classified: Secondary | ICD-10-CM

## 2014-07-01 DIAGNOSIS — Z96612 Presence of left artificial shoulder joint: Secondary | ICD-10-CM | POA: Diagnosis not present

## 2014-07-01 DIAGNOSIS — M25612 Stiffness of left shoulder, not elsewhere classified: Secondary | ICD-10-CM | POA: Diagnosis not present

## 2014-07-01 DIAGNOSIS — M25512 Pain in left shoulder: Secondary | ICD-10-CM

## 2014-07-01 DIAGNOSIS — M6281 Muscle weakness (generalized): Secondary | ICD-10-CM | POA: Diagnosis not present

## 2014-07-01 DIAGNOSIS — Z471 Aftercare following joint replacement surgery: Secondary | ICD-10-CM | POA: Diagnosis not present

## 2014-07-01 NOTE — Therapy (Signed)
[facility] [address] [place], [place], [address] Phone: [phone]   Fax:  [phone]  Occupational Therapy Treatment  Patient Details  Name: Heather [name] MRN: [id number] Date of Birth: 06/16/1940 Referring Provider:  Avon Gully, MD  Encounter Date: 07/01/2014      OT End of Session - 07/01/14 1535    Visit Number 11   Number of Visits 24   Date for OT Re-Evaluation 07/17/14   Authorization Type [employer]   Authorization Time Period no authorization required update g code 18th visit   Authorization - Visit Number 11   Authorization - Number of Visits 18   OT Start Time 1302   OT Stop Time 1347   OT Time Calculation (min) 45 min   Activity Tolerance Patient tolerated treatment well   Behavior During Therapy [facility] for tasks assessed/performed      Past Medical History  Diagnosis Date  . HTN (hypertension)   . COPD (chronic obstructive pulmonary disease)   . GERD (gastroesophageal reflux disease)   . Glaucoma   . Anxiety   . Depression   . Anemia   . Arthritis   . Gout     Past Surgical History  Procedure Laterality Date  . Joint replacement Bilateral     hip   . Joint replacement Right     knee  . Shoulder arthroscopy Right   . Abdominal hysterectomy    . Foot surgery Left     repair of "kissing Cousins"  . Bunionectomy Bilateral   . Back surgery      lumbar-disc  . Shoulder hemi-arthroplasty Left 03/25/2014    Procedure: LEFT SHOULDER HEMI-ARTHROPLASTY;  Surgeon: Vickki Hearing, MD;  Location: [facility];  Service: Orthopedics;  Laterality: Left;    There were no vitals filed for this visit.  Visit Diagnosis:  Shoulder pain, left  Limited joint range of motion  Decreased muscle strength      Subjective Assessment - 07/01/14 1302    Subjective  S: It's feeling almost great today!   Currently in Pain? No/denies            [facility] OT Assessment - 07/01/14 1321    Assessment   Diagnosis S/P Left Total Shoulder Replacement   Precautions   Precautions Shoulder   Type of Shoulder Precautions AAROM through 05/20/14 progressing to AROM as able                  OT Treatments/Exercises (OP) - 07/01/14 1304    Exercises   Exercises Shoulder   Shoulder Exercises: Supine   Protraction PROM;5 reps;AROM;12 reps   Horizontal ABduction PROM;5 reps;AROM;12 reps   External Rotation PROM;5 reps;AROM;12 reps   Internal Rotation PROM;5 reps;AROM;12 reps   Flexion PROM;5 reps;AROM;12 reps   ABduction PROM;5 reps;AROM;12 reps   ABduction Limitations able to achieve 60% range.   Shoulder Exercises: Seated   Protraction AAROM;12 reps   Horizontal ABduction AAROM;12 reps   External Rotation AAROM;12 reps   Internal Rotation AAROM;12 reps   Flexion AAROM;12 reps   Flexion Limitations required assistance for shoulder to help maintain proper form.   Abduction AAROM;12 reps   Shoulder Exercises: Standing   External Rotation Theraband;15 reps   Theraband Level (Shoulder External Rotation) Level 2 (Red)   Internal Rotation Theraband;15 reps   Theraband Level (Shoulder Internal Rotation) Level 2 (Red)   Extension Theraband;15 reps   Theraband Level (Shoulder Extension) Level 2 (Red)   Row  Theraband;15 reps   Theraband Level (Shoulder Row) Level 2 (Red)   Retraction Theraband;15 reps   Theraband Level (Shoulder Retraction) Level 2 (Red)   Manual Therapy   Manual Therapy Myofascial release   Myofascial Release Myofascial release to left upper arm, trapezius, and scapularis region to decrease joint mobility and increase joint mobility in a pain free zone.                OT Education - 07/01/14 1535    Education Details Scapular theraband exercises.    Person(s) Educated Patient   Methods Explanation;Demonstration;Handout   Comprehension Verbalized understanding;Returned demonstration          OT Short Term Goals - 06/17/14 1640    OT SHORT TERM GOAL #1    Title Patient will be educated on HEP.   OT SHORT TERM GOAL #2   Title Patient will have WFL PROM in her left shoulder in order to don and doff shirts and coats with increased ease.   OT SHORT TERM GOAL #3   Title Patient will improve left shoulder strength to 3+/5 for increased ability to lift bags of groceries.    Status On-going   OT SHORT TERM GOAL #4   Title Patient will decrease pain to 4/10 or better in left shoulder during functional activities.    Status On-going   OT SHORT TERM GOAL #5   Title Patient will decrease fascial restrictions to moderate in her left shoulder.           OT Long Term Goals - 06/17/14 1641    OT LONG TERM GOAL #1   Title Patient will return to prior level of independence with all functional activities and leisure activities.    Status On-going   OT LONG TERM GOAL #2   Title Patient will have WFL AROM in her left shoulder for increased ability to braid and wash her hair.   Status On-going   OT LONG TERM GOAL #3   Title Patient will improve left shoulder strength to 4+/5 for increased ability to lift pots an pans when cooking.    Status On-going   OT LONG TERM GOAL #4   Title Patient will decrease pain to 2/10 in left shoulder while completing functional activities.    Status On-going   OT LONG TERM GOAL #5   Title Patient will decrease fascial restrictions in her left shoulder to minimal.   Status On-going               Plan - 07/01/14 1536    Clinical Impression Statement A: Continued working on proper form during exercises. Provided scapular theraband HEP, did not provide additional shoulder theraband exercises due to poor form during therapy session. Pt tolerated treatment well. Some exercises not completed due to time constraints.    Plan P: Continue to focus on form, provide AAROM/AROM exercise HEP. Resume missed exercises.         Problem List Patient Active Problem List   Diagnosis Date Noted  . HTN (hypertension)  04/17/2014  . Dry eyes 04/14/2014  . Bleeding from the nose 04/01/2014  . Renal insufficiency 04/01/2014  . Rotator cuff arthropathy   . Muscle weakness (generalized) 05/26/2013  . Pain in joint, shoulder region 05/26/2013  . Rotator cuff syndrome 05/22/2013    Ezra Sites, OTR/L  [phone]  07/01/2014, 3:39 PM  [facility] [address] [place], [place], [address] Phone: [phone]   Fax:  [phone]

## 2014-07-01 NOTE — Patient Instructions (Addendum)

## 2014-07-08 ENCOUNTER — Ambulatory Visit (HOSPITAL_COMMUNITY): Payer: Commercial Managed Care - HMO | Attending: Orthopedic Surgery | Admitting: Occupational Therapy

## 2014-07-08 ENCOUNTER — Encounter (HOSPITAL_COMMUNITY): Payer: Self-pay | Admitting: Occupational Therapy

## 2014-07-08 DIAGNOSIS — M25512 Pain in left shoulder: Secondary | ICD-10-CM | POA: Insufficient documentation

## 2014-07-08 DIAGNOSIS — Z96612 Presence of left artificial shoulder joint: Secondary | ICD-10-CM | POA: Diagnosis not present

## 2014-07-08 DIAGNOSIS — M2560 Stiffness of unspecified joint, not elsewhere classified: Secondary | ICD-10-CM

## 2014-07-08 DIAGNOSIS — M25612 Stiffness of left shoulder, not elsewhere classified: Secondary | ICD-10-CM | POA: Insufficient documentation

## 2014-07-08 DIAGNOSIS — M6281 Muscle weakness (generalized): Secondary | ICD-10-CM | POA: Insufficient documentation

## 2014-07-08 DIAGNOSIS — Z471 Aftercare following joint replacement surgery: Secondary | ICD-10-CM | POA: Diagnosis not present

## 2014-07-08 NOTE — Patient Instructions (Signed)
ROM: Flexion - Wand   Bring wand directly over head, leading with right side. Reach back until stretch is felt. . Repeat _10-15___ times per set. Do __1-2__ sets day.  http://orth.exer.us/744   Copyright  VHI. All rights reserved.   ROM: Abduction - Wand   Holding wand with left hand palm up, push wand directly out to side, leading with other hand palm down, until stretch is felt. Repeat _10-15___ times per set. Do _1-2___ sets per day. http://orth.exer.us/746   Copyright  VHI. All rights reserved.   ROM: Horizontal Abduction / Adduction - Wand   Keeping both palms down, push right hand across body with other hand. Then pull back across body, keeping arms parallel to floor. Do not allow trunk to twist.  Repeat _10-15___ times per set. Do _1-2___ sets per day.   http://orth.exer.us/752   Copyright  VHI. All rights reserved.   ROM: External / Internal Rotation - Wand   Holding wand with left hand palm up, push out from body with other hand, palm down. Keep both elbows bent.  Repeat to other side, leading with same hand. Keep elbows bent. Repeat _10-15___ times per set. Do _1-2___ sets per day.   http://orth.exer.us/748   Copyright  VHI. All rights reserved.    1) Shoulder Protraction    Begin with elbows by your side, slowly "punch" straight out in front of you keeping arms/elbows straight. Repeat _10-15__times, _1-2___sets/day.     2) Shoulder Flexion  Supine:     Standing:         Begin with arms at your side with thumbs pointed up, slowly raise both arms up and forward towards overhead. Repeat_10-15__times, _1-2__sets/day.    3) Horizontal abduction/adduction  Supine:   Standing:           Begin with arms straight out in front of you, bring out to the side in at "T" shape. Keep arms straight entire time. Repeat _10-15___times, _1-2___sets/day.    4) Internal & External Rotation    *No band* -Stand with elbows at the side and elbows bent 90  degrees. Move your forearms away from your body, then bring back inward toward the body.  Repeat _10-15__times, _1-2__sets/day    5) Shoulder Abduction  Supine:     Standing:       Lying on your back begin with your arms flat on the table next to your side. Slowly move your arms out to the side so that they go overhead, in a jumping jack or snow angel movement. Repeat _10-15__times, _1-2__sets/day

## 2014-07-08 NOTE — Therapy (Signed)
[facility] [address] [place], [place], [address] Phone: [phone]   Fax:  [phone]  Occupational Therapy Treatment  Patient Details  Name: Heather [name] MRN: [id number] Date of Birth: 04/12/1940 Referring Provider:  Vickki Hearing, MD  Encounter Date: 07/08/2014      OT End of Session - 07/08/14 1524    Visit Number 12   Number of Visits 24   Date for OT Re-Evaluation 07/17/14   Authorization Type [employer]   Authorization Time Period no authorization required update g code 18th visit   Authorization - Visit Number 12   Authorization - Number of Visits 18   OT Start Time 0100   OT Stop Time 0146   OT Time Calculation (min) 46 min   Activity Tolerance Patient tolerated treatment well   Behavior During Therapy [facility] for tasks assessed/performed      Past Medical History  Diagnosis Date  . HTN (hypertension)   . COPD (chronic obstructive pulmonary disease)   . GERD (gastroesophageal reflux disease)   . Glaucoma   . Anxiety   . Depression   . Anemia   . Arthritis   . Gout     Past Surgical History  Procedure Laterality Date  . Joint replacement Bilateral     hip   . Joint replacement Right     knee  . Shoulder arthroscopy Right   . Abdominal hysterectomy    . Foot surgery Left     repair of "kissing Cousins"  . Bunionectomy Bilateral   . Back surgery      lumbar-disc  . Shoulder hemi-arthroplasty Left 03/25/2014    Procedure: LEFT SHOULDER HEMI-ARTHROPLASTY;  Surgeon: Vickki Hearing, MD;  Location: [facility];  Service: Orthopedics;  Laterality: Left;    There were no vitals filed for this visit.  Visit Diagnosis:  Shoulder pain, left  Limited joint range of motion  Decreased muscle strength      Subjective Assessment - 07/08/14 1303    Subjective  S: I'm feeling pretty good today.    Currently in Pain? Yes   Pain Score 1    Pain Location Shoulder   Pain Orientation Left   Pain  Descriptors / Indicators Aching   Pain Type Acute pain            [facility] OT Assessment - 07/08/14 1523    Assessment   Diagnosis S/P Left Total Shoulder Replacement   Precautions   Precautions Shoulder   Type of Shoulder Precautions AAROM through 05/20/14 progressing to AROM as able                  OT Treatments/Exercises (OP) - 07/08/14 1322    Exercises   Exercises Shoulder   Shoulder Exercises: Supine   Protraction PROM;5 reps;AROM;12 reps   Horizontal ABduction PROM;5 reps;AROM;12 reps   External Rotation PROM;5 reps;AROM;12 reps   Internal Rotation PROM;5 reps;AROM;12 reps   Flexion PROM;5 reps;AROM;12 reps   ABduction PROM;5 reps;AROM;12 reps   ABduction Limitations able to achieve 60% range.   Shoulder Exercises: Seated   Protraction AAROM;12 reps   Horizontal ABduction AAROM;12 reps   Horizontal ABduction Limitations required 1 rest break   External Rotation AAROM;12 reps   Internal Rotation AAROM;12 reps   Flexion AAROM;12 reps   Flexion Limitations required assistance for shoulder to help maintain proper form.   Abduction AAROM;12 reps   ABduction Limitations <50% range   Shoulder Exercises: ROM/Strengthening  Wall Pushups 10 reps   Proximal Shoulder Strengthening, Supine 10X no rest break   Manual Therapy   Manual Therapy Myofascial release   Myofascial Release Myofascial release to left upper arm, trapezius, and scapularis region to decrease joint mobility and increase joint mobility in a pain free zone.                OT Education - 07/08/14 1524    Education provided Yes   Education Details AROM supine & AAROM seated exercises   Person(s) Educated Patient   Methods Explanation;Demonstration;Handout   Comprehension Verbalized understanding;Returned demonstration          OT Short Term Goals - 06/17/14 1640    OT SHORT TERM GOAL #1   Title Patient will be educated on HEP.   OT SHORT TERM GOAL #2   Title Patient will have WFL  PROM in her left shoulder in order to don and doff shirts and coats with increased ease.   OT SHORT TERM GOAL #3   Title Patient will improve left shoulder strength to 3+/5 for increased ability to lift bags of groceries.    Status On-going   OT SHORT TERM GOAL #4   Title Patient will decrease pain to 4/10 or better in left shoulder during functional activities.    Status On-going   OT SHORT TERM GOAL #5   Title Patient will decrease fascial restrictions to moderate in her left shoulder.           OT Long Term Goals - 06/17/14 1641    OT LONG TERM GOAL #1   Title Patient will return to prior level of independence with all functional activities and leisure activities.    Status On-going   OT LONG TERM GOAL #2   Title Patient will have WFL AROM in her left shoulder for increased ability to braid and wash her hair.   Status On-going   OT LONG TERM GOAL #3   Title Patient will improve left shoulder strength to 4+/5 for increased ability to lift pots an pans when cooking.    Status On-going   OT LONG TERM GOAL #4   Title Patient will decrease pain to 2/10 in left shoulder while completing functional activities.    Status On-going   OT LONG TERM GOAL #5   Title Patient will decrease fascial restrictions in her left shoulder to minimal.   Status On-going               Plan - 07/08/14 1525    Clinical Impression Statement A: Continued AROM/AAROM exercises this date, focusing on form; pt continues to requiring cuing to depress shoulder. Provided pt with and educated pt on AAROM seated and AROM supine exercises.  Pt tolerated treatment well. Theraband exercises not completed  due to time constraints.    Plan P: Reassess. Follow-up on HEP. Resume theraband exercises.         Problem List Patient Active Problem List   Diagnosis Date Noted  . HTN (hypertension) 04/17/2014  . Dry eyes 04/14/2014  . Bleeding from the nose 04/01/2014  . Renal insufficiency 04/01/2014  .  Rotator cuff arthropathy   . Muscle weakness (generalized) 05/26/2013  . Pain in joint, shoulder region 05/26/2013  . Rotator cuff syndrome 05/22/2013    Ezra Sites, OTR/L  [phone]  07/08/2014, 3:29 PM  [facility] [address] [place], [place], [address] Phone: [phone]   Fax:  [phone]

## 2014-07-13 ENCOUNTER — Ambulatory Visit (HOSPITAL_COMMUNITY): Payer: Commercial Managed Care - HMO | Admitting: Specialist

## 2014-07-13 DIAGNOSIS — M6281 Muscle weakness (generalized): Secondary | ICD-10-CM

## 2014-07-13 DIAGNOSIS — M25512 Pain in left shoulder: Secondary | ICD-10-CM | POA: Diagnosis not present

## 2014-07-13 DIAGNOSIS — M25612 Stiffness of left shoulder, not elsewhere classified: Secondary | ICD-10-CM | POA: Diagnosis not present

## 2014-07-13 DIAGNOSIS — Z96612 Presence of left artificial shoulder joint: Secondary | ICD-10-CM

## 2014-07-13 DIAGNOSIS — Z471 Aftercare following joint replacement surgery: Secondary | ICD-10-CM | POA: Diagnosis not present

## 2014-07-13 DIAGNOSIS — M2560 Stiffness of unspecified joint, not elsewhere classified: Secondary | ICD-10-CM

## 2014-07-13 NOTE — Patient Instructions (Signed)
ROM: Towel Stretch - with Interior Rotation   Pull left arm up behind back by pulling towel up with other arm. Hold _15___ seconds. Repeat _5___ times per set.  Do __3__ sessions per day.  http://orth.exer.us/889   Copyright  VHI. All rights reserved.  External Rotation   Lay on your back with hands clasped behind head. Pull elbows back as far as possible. Hold ___15_ seconds. Repeat ___5_ times. Do 3____ sessions per day.  Copyright  VHI. All rights   Shoulder Strengthening in Supine -   Hold a water bottle in your hand for resistance  Complete each exercise 10 times each 2-3 times per day 1) Shoulder Protraction    Begin with elbows by your side, slowly "punch" straight out in front of you keeping arms/elbows straight. Repeat ___times, ____set/day.     2) Shoulder Flexion  Supine:            Begin with arms at your side with thumbs pointed up, slowly raise both arms up and forward towards overhead. Repeat___times, ___set/day.               3) Horizontal abduction/adduction  Supine:             Begin with arms straight out in front of you, bring out to the side in at "T" shape. Keep arms straight entire time. Repeat ____times, ____sets/day.                 4) Internal & External Rotation    *No band* -Stand with elbows at the side and elbows bent 90 degrees. Move your forearms away from your body, then bring back inward toward the body.  Repeat ___times, ___sets/day    5) Shoulder Abduction  Supine:     Standing:       Lying on your back begin with your arms flat on the table next to your side. Slowly move your arms out to the side so that they go overhead, in a jumping jack or snow angel movement. Repeat ___times, ___sets/day

## 2014-07-13 NOTE — Therapy (Signed)
[place], [place], [address] Phone: [phone]   Fax:  [phone]  Occupational Therapy Treatment and Recertification  Patient Details  Name: Heather [name] MRN: [id number] Date of Birth: 26-Dec-1940 Referring Provider:  Carole Civil, MD  Encounter Date: 07/13/2014      OT End of Session - 07/13/14 1527    Visit Number 13   Number of Visits 24   Date for OT Re-Evaluation 09/11/14  08/12/14 mini reassess   Authorization Type [employer]   Authorization Time Period no authorization required update g code 18th visit   Authorization - Visit Number 13   Authorization - Number of Visits 18   OT Start Time 1351   OT Stop Time 1437   OT Time Calculation (min) 46 min   Activity Tolerance Patient tolerated treatment well   Behavior During Therapy [facility] for tasks assessed/performed      Past Medical History  Diagnosis Date  . HTN (hypertension)   . COPD (chronic obstructive pulmonary disease)   . GERD (gastroesophageal reflux disease)   . Glaucoma   . Anxiety   . Depression   . Anemia   . Arthritis   . Gout     Past Surgical History  Procedure Laterality Date  . Joint replacement Bilateral     hip   . Joint replacement Right     knee  . Shoulder arthroscopy Right   . Abdominal hysterectomy    . Foot surgery Left     repair of "kissing Cousins"  . Bunionectomy Bilateral   . Back surgery      lumbar-disc  . Shoulder hemi-arthroplasty Left 03/25/2014    Procedure: LEFT SHOULDER HEMI-ARTHROPLASTY;  Surgeon: Carole Civil, MD;  Location: [facility];  Service: Orthopedics;  Laterality: Left;    There were no vitals filed for this visit.  Visit Diagnosis:  Shoulder pain, left - Plan: Ot plan of care cert/re-cert  Limited joint range of motion - Plan: Ot plan of care cert/re-cert  Decreased muscle strength - Plan: Ot plan of care cert/re-cert  S/P shoulder replacement, left - Plan: Ot plan of  care cert/re-cert      Subjective Assessment - 07/13/14 1518    Subjective  S:  Its doing alot better than it was.  I can do so much more.  I still can't braid my hair in the back or reach into an overhead cabinet though   Limitations progress as tolerated   Currently in Pain? Yes   Pain Score 1    Pain Location Shoulder   Pain Orientation Left   Pain Descriptors / Indicators Aching   Pain Type Chronic pain   Pain Radiating Towards from shoulder joint to her elbow   Pain Onset More than a month ago   Pain Frequency Intermittent   Aggravating Factors  using her arm   Pain Relieving Factors rest and medication   Effect of Pain on Daily Activities fluid movement to shoulder height only            [facility] OT Assessment - 07/13/14 0001    Assessment   Diagnosis S/P Left Total Shoulder Replacement   Onset Date 03/25/14   Precautions   Precautions Shoulder   Type of Shoulder Precautions progress as tolerated   ADL   ADL comments increase use of left arm with all activities at waist and shoulder height.  Continues to have difficulty braiding her hair, fastening her bra,  and reaching overhead   AROM   Overall AROM Comments assessed in supine, ER/IR with shoulder adducted (seated AROM) (supine AROM from 06/10/14)   Left Shoulder Flexion 132 Degrees  85 seated, (128)   Left Shoulder ABduction 154 Degrees  90 seated (was 94)   Left Shoulder Internal Rotation 90 Degrees  90 seated (was 90)   Left Shoulder External Rotation 63 Degrees  55 seated (was 55)   PROM   Overall PROM Comments WFL in supine   Strength   Overall Strength Comments assessed seated. IR/ER adducted   Left Shoulder Flexion 3/5  3-/5   Left Shoulder ABduction 3/5  3-/5   Left Shoulder Internal Rotation 3+/5  3/5   Left Shoulder External Rotation 4-/5  3+/5                  OT Treatments/Exercises (OP) - 07/13/14 0001    Exercises   Exercises Shoulder   Shoulder Exercises: Supine   Protraction  PROM;5 reps;Strengthening;10 reps   Protraction Weight (lbs) 1   Horizontal ABduction PROM;5 reps;Strengthening;10 reps   Horizontal ABduction Weight (lbs) 1   External Rotation PROM;5 reps;Strengthening;10 reps   External Rotation Weight (lbs) 1   Internal Rotation PROM;5 reps;Strengthening;10 reps   Internal Rotation Weight (lbs) 1   Flexion PROM;5 reps;Strengthening;10 reps   Shoulder Flexion Weight (lbs) 1   ABduction PROM;5 reps;Strengthening;10 reps   Shoulder ABduction Weight (lbs) 1   Shoulder Exercises: Seated   Protraction AROM;Limitations   Protraction Limitations required hand over hand assist and tactile cues to depress shoulder   Horizontal ABduction AROM;10 reps   Horizontal ABduction Limitations required hand over hand assist and tactile cues to depress shoulrequired hand over hand assist and tactile cues to depress shoulder   External Rotation AROM;12 reps   External Rotation Limitations required hand over hand assist and tactile cues to depress shoulder   Internal Rotation AROM;10 reps   Internal Rotation Limitations required hand over hand assist and tactile cues to depress shoulder   Flexion AROM;10 reps   Flexion Weight (lbs) required hand over hand assist and tactile cues to depress shoulder   Abduction AROM;10 reps   ABduction Limitations required hand over hand assist and tactile cues to depress shoulder   Shoulder Exercises: ROM/Strengthening   Rebounder UBE 2' forward and 2' reverse at 1/0   Shoulder Exercises: Stretch   Internal Rotation Stretch 10 seconds   Star Gazer Stretch 1 rep;10 seconds   Manual Therapy   Manual Therapy Myofascial release   Myofascial Release Myofascial release to left upper arm, trapezius, and scapularis region to decrease joint mobility and increase joint mobility in a pain free zone.                OT Education - 07/13/14 1527    Education provided Yes   Education Details strengthening in supine, stargazer stretch and  internal rotation with towel   Person(s) Educated Patient   Methods Explanation;Demonstration;Handout   Comprehension Verbalized understanding;Returned demonstration          OT Short Term Goals - 07/13/14 1532    OT SHORT TERM GOAL #1   Title Patient will be educated on HEP.   Status Achieved   OT SHORT TERM GOAL #2   Title Patient will have WFL PROM in her left shoulder in order to don and doff shirts and coats with increased ease.   Status Achieved   OT SHORT TERM GOAL #3   Title Patient   will improve left shoulder strength to 3+/5 for increased ability to lift bags of groceries.    Status Partially Met   OT SHORT TERM GOAL #4   Title Patient will decrease pain to 4/10 or better in left shoulder during functional activities.    Status Achieved   OT SHORT TERM GOAL #5   Title Patient will decrease fascial restrictions to moderate in her left shoulder.   Status Achieved           OT Long Term Goals - 06/17/14 1641    OT LONG TERM GOAL #1   Title Patient will return to prior level of independence with all functional activities and leisure activities.    Status On-going   OT LONG TERM GOAL #2   Title Patient will have WFL AROM in her left shoulder for increased ability to braid and wash her hair.   Status On-going   OT LONG TERM GOAL #3   Title Patient will improve left shoulder strength to 4+/5 for increased ability to lift pots an pans when cooking.    Status On-going   OT LONG TERM GOAL #4   Title Patient will decrease pain to 2/10 in left shoulder while completing functional activities.    Status On-going   OT LONG TERM GOAL #5   Title Patient will decrease fascial restrictions in her left shoulder to minimal.   Status On-going               Plan - 07/13/14 1528    Clinical Impression Statement A:  Ms. Kirchner is approximately 3 1/2 months post op shoulder replacement.  She is making good gains in PROM and AROM in supine.  Functionally, she is able to  complete tasks at waist height and shoulder height without difficulty.  She continues to struggle with AROM over head and reaching behind her head or behind her back.  Her strength is fair, and she has a good deal of difficulty lifting anything with her left arm.  Patient will benefit from continued skilled OT services 1 x per week for continued improvements towards independence with functional reaching overhead, behind her head, and behind her back.    Pt will benefit from skilled therapeutic intervention in order to improve on the following deficits (Retired) Decreased range of motion;Decreased strength;Decreased scar mobility;Increased fascial restricitons;Increased muscle spasms;Pain   Rehab Potential Good   OT Frequency 1x / week   OT Duration 8 weeks   OT Treatment/Interventions Self-care/ADL training;Cryotherapy;Moist Heat;Neuromuscular education;Manual Therapy;Passive range of motion;Therapeutic exercises;Patient/family education   Plan P: focus on strengthening available range in supine, functional reaching activities, proximal shoulder strengthenin.g    Consulted and Agree with Plan of Care Patient        Problem List Patient Active Problem List   Diagnosis Date Noted  . HTN (hypertension) 04/17/2014  . Dry eyes 04/14/2014  . Bleeding from the nose 04/01/2014  . Renal insufficiency 04/01/2014  . Rotator cuff arthropathy   . Muscle weakness (generalized) 05/26/2013  . Pain in joint, shoulder region 05/26/2013  . Rotator cuff syndrome 05/22/2013    Vangie Bicker, OTR/L [phone]  07/13/2014, 3:35 PM  [place] [address] [place], [place], [address] Phone: [phone]   Fax:  [phone]

## 2014-07-15 DIAGNOSIS — M1990 Unspecified osteoarthritis, unspecified site: Secondary | ICD-10-CM | POA: Diagnosis not present

## 2014-07-15 DIAGNOSIS — I10 Essential (primary) hypertension: Secondary | ICD-10-CM | POA: Diagnosis not present

## 2014-07-15 DIAGNOSIS — Z96643 Presence of artificial hip joint, bilateral: Secondary | ICD-10-CM | POA: Diagnosis not present

## 2014-07-15 DIAGNOSIS — J449 Chronic obstructive pulmonary disease, unspecified: Secondary | ICD-10-CM | POA: Diagnosis not present

## 2014-07-20 ENCOUNTER — Ambulatory Visit (HOSPITAL_COMMUNITY): Payer: Commercial Managed Care - HMO | Admitting: Specialist

## 2014-07-20 DIAGNOSIS — M6281 Muscle weakness (generalized): Secondary | ICD-10-CM | POA: Diagnosis not present

## 2014-07-20 DIAGNOSIS — Z471 Aftercare following joint replacement surgery: Secondary | ICD-10-CM | POA: Diagnosis not present

## 2014-07-20 DIAGNOSIS — M25512 Pain in left shoulder: Secondary | ICD-10-CM | POA: Diagnosis not present

## 2014-07-20 DIAGNOSIS — M2560 Stiffness of unspecified joint, not elsewhere classified: Secondary | ICD-10-CM

## 2014-07-20 DIAGNOSIS — Z96612 Presence of left artificial shoulder joint: Secondary | ICD-10-CM | POA: Diagnosis not present

## 2014-07-20 DIAGNOSIS — M25612 Stiffness of left shoulder, not elsewhere classified: Secondary | ICD-10-CM | POA: Diagnosis not present

## 2014-07-20 NOTE — Therapy (Signed)
[place], [place], [address] Phone: [phone]   Fax:  [phone]  Occupational Therapy Treatment  Patient Details  Name: Heather [name] MRN: [id number] Date of Birth: Aug 18, 1940 Referring Provider:  Rosita Fire, MD  Encounter Date: 07/20/2014      OT End of Session - 07/20/14 1157    Visit Number 14   Number of Visits 24   Date for OT Re-Evaluation 09/11/14  6/8 mini reassess   Authorization Type [employer]   Authorization Time Period no authorization required update g code 18th visit   Authorization - Visit Number 14   Authorization - Number of Visits 18   OT Start Time 1111   OT Stop Time 1154   OT Time Calculation (min) 43 min   Activity Tolerance Patient tolerated treatment well   Behavior During Therapy [facility] for tasks assessed/performed      Past Medical History  Diagnosis Date  . HTN (hypertension)   . COPD (chronic obstructive pulmonary disease)   . GERD (gastroesophageal reflux disease)   . Glaucoma   . Anxiety   . Depression   . Anemia   . Arthritis   . Gout     Past Surgical History  Procedure Laterality Date  . Joint replacement Bilateral     hip   . Joint replacement Right     knee  . Shoulder arthroscopy Right   . Abdominal hysterectomy    . Foot surgery Left     repair of "kissing Cousins"  . Bunionectomy Bilateral   . Back surgery      lumbar-disc  . Shoulder hemi-arthroplasty Left 03/25/2014    Procedure: LEFT SHOULDER HEMI-ARTHROPLASTY;  Surgeon: Carole Civil, MD;  Location: [facility];  Service: Orthopedics;  Laterality: Left;    There were no vitals filed for this visit.  Visit Diagnosis:  Shoulder pain, left  Limited joint range of motion  Decreased muscle strength      Subjective Assessment - 07/20/14 1111    Subjective  S:  I think I may have overdone it with the bike.  My neck and shoulder are really sore.   Currently in Pain? Yes   Pain Score  7    Pain Location Shoulder   Pain Orientation Left   Pain Descriptors / Indicators Aching   Pain Type Acute pain            [facility] OT Assessment - 07/20/14 0001    Assessment   Diagnosis S/P Left Total Shoulder Replacement   Onset Date 03/25/14   Precautions   Precautions Shoulder   Type of Shoulder Precautions progress as tolerated                  OT Treatments/Exercises (OP) - 07/20/14 0001    Shoulder Exercises: Supine   Protraction PROM;5 reps;Strengthening;12 reps   Protraction Weight (lbs) 1   Horizontal ABduction PROM;5 reps;Strengthening;12 reps   Horizontal ABduction Weight (lbs) 1   External Rotation PROM;5 reps;Strengthening;12 reps   External Rotation Weight (lbs) 1   Internal Rotation PROM;5 reps;Strengthening;12 reps   Internal Rotation Weight (lbs) 1   Flexion PROM;5 reps;Strengthening;12 reps   Shoulder Flexion Weight (lbs) 1   ABduction PROM;5 reps;Strengthening;12 reps   Shoulder ABduction Weight (lbs) 1   Shoulder Exercises: ROM/Strengthening   Rebounder UBE 2' forward and 2' reverse at 1.0   Proximal Shoulder Strengthening, Supine 15 seconds holding 1 pound weigfht   Rhythmic Stabilization,  Supine 15 seconds at 90 120, 70 with 1# weight in hand max difficulty ressisting min resistance     Functional Reaching Activities   Low Level seated completed pinch tree from waist height to shoulder height    Manual Therapy   Manual Therapy Myofascial release   Myofascial Release Myofascial release to left upper arm, trapezius, and scapularis region to decrease joint mobility and increase joint mobility in a pain free zone.                  OT Short Term Goals - 07/13/14 1532    OT SHORT TERM GOAL #1   Title Patient will be educated on HEP.   Status Achieved   OT SHORT TERM GOAL #2   Title Patient will have WFL PROM in her left shoulder in order to don and doff shirts and coats with increased ease.   Status Achieved   OT SHORT TERM  GOAL #3   Title Patient will improve left shoulder strength to 3+/5 for increased ability to lift bags of groceries.    Status Partially Met   OT SHORT TERM GOAL #4   Title Patient will decrease pain to 4/10 or better in left shoulder during functional activities.    Status Achieved   OT SHORT TERM GOAL #5   Title Patient will decrease fascial restrictions to moderate in her left shoulder.   Status Achieved           OT Long Term Goals - 06/17/14 1641    OT LONG TERM GOAL #1   Title Patient will return to prior level of independence with all functional activities and leisure activities.    Status On-going   OT LONG TERM GOAL #2   Title Patient will have WFL AROM in her left shoulder for increased ability to braid and wash her hair.   Status On-going   OT LONG TERM GOAL #3   Title Patient will improve left shoulder strength to 4+/5 for increased ability to lift pots an pans when cooking.    Status On-going   OT LONG TERM GOAL #4   Title Patient will decrease pain to 2/10 in left shoulder while completing functional activities.    Status On-going   OT LONG TERM GOAL #5   Title Patient will decrease fascial restrictions in her left shoulder to minimal.   Status On-going               Plan - 07/20/14 1158    Clinical Impression Statement A:  Patient arrived with 7/10 pain in trapezius and shoulder, after MFR intervention pain decreased to 2/10.  Recommended dc towel stetch for internal rotation, as this may be irritating her shoulder more than helping with ROM.  Added rhythmic stabilization and functional reaching activities.    Plan P:  increase strength by improving amount of resistance she can withstand with rhythmic stabilization. increase time on UBE to 6 minutes.        Problem List Patient Active Problem List   Diagnosis Date Noted  . HTN (hypertension) 04/17/2014  . Dry eyes 04/14/2014  . Bleeding from the nose 04/01/2014  . Renal insufficiency 04/01/2014   . Rotator cuff arthropathy   . Muscle weakness (generalized) 05/26/2013  . Pain in joint, shoulder region 05/26/2013  . Rotator cuff syndrome 05/22/2013    Vangie Bicker, OTR/L [phone]  07/20/2014, 12:01 PM  [place], [place], [address] Phone:  [phone]   Fax:  [phone]

## 2014-07-27 ENCOUNTER — Encounter (HOSPITAL_COMMUNITY): Payer: Self-pay | Admitting: Occupational Therapy

## 2014-07-27 ENCOUNTER — Ambulatory Visit (HOSPITAL_COMMUNITY): Payer: Commercial Managed Care - HMO | Admitting: Occupational Therapy

## 2014-07-27 DIAGNOSIS — Z96612 Presence of left artificial shoulder joint: Secondary | ICD-10-CM | POA: Diagnosis not present

## 2014-07-27 DIAGNOSIS — M6281 Muscle weakness (generalized): Secondary | ICD-10-CM | POA: Diagnosis not present

## 2014-07-27 DIAGNOSIS — Z471 Aftercare following joint replacement surgery: Secondary | ICD-10-CM | POA: Diagnosis not present

## 2014-07-27 DIAGNOSIS — M25612 Stiffness of left shoulder, not elsewhere classified: Secondary | ICD-10-CM | POA: Diagnosis not present

## 2014-07-27 DIAGNOSIS — M2560 Stiffness of unspecified joint, not elsewhere classified: Secondary | ICD-10-CM

## 2014-07-27 DIAGNOSIS — M25512 Pain in left shoulder: Secondary | ICD-10-CM | POA: Diagnosis not present

## 2014-07-27 NOTE — Therapy (Signed)
[place], [place], [address] Phone: [phone]   Fax:  [phone]  Occupational Therapy Treatment  Patient Details  Name: Heather [name] MRN: [id number] Date of Birth: 1940/04/09 Referring Provider:  Carole Civil, MD  Encounter Date: 07/27/2014      OT End of Session - 07/27/14 1611    Visit Number 15   Number of Visits 24   Date for OT Re-Evaluation 09/11/14  6/8 mini reassess   Authorization Type [employer]   Authorization Time Period no authorization required update g code 18th visit   Authorization - Visit Number 15   Authorization - Number of Visits 18   OT Start Time 2081   OT Stop Time 1515   OT Time Calculation (min) 30 min   Activity Tolerance Patient tolerated treatment well   Behavior During Therapy [facility] for tasks assessed/performed      Past Medical History  Diagnosis Date  . HTN (hypertension)   . COPD (chronic obstructive pulmonary disease)   . GERD (gastroesophageal reflux disease)   . Glaucoma   . Anxiety   . Depression   . Anemia   . Arthritis   . Gout     Past Surgical History  Procedure Laterality Date  . Joint replacement Bilateral     hip   . Joint replacement Right     knee  . Shoulder arthroscopy Right   . Abdominal hysterectomy    . Foot surgery Left     repair of "kissing Cousins"  . Bunionectomy Bilateral   . Back surgery      lumbar-disc  . Shoulder hemi-arthroplasty Left 03/25/2014    Procedure: LEFT SHOULDER HEMI-ARTHROPLASTY;  Surgeon: Carole Civil, MD;  Location: [facility];  Service: Orthopedics;  Laterality: Left;    There were no vitals filed for this visit.  Visit Diagnosis:  Shoulder pain, left  Limited joint range of motion  Decreased muscle strength      Subjective Assessment - 07/27/14 1447    Subjective  S: I took pain medication before I left, so it's feeling pretty good right now.    Currently in Pain? No/denies             [facility] OT Assessment - 07/27/14 1502    Assessment   Diagnosis S/P Left Total Shoulder Replacement   Precautions   Precautions Shoulder   Type of Shoulder Precautions progress as tolerated                  OT Treatments/Exercises (OP) - 07/27/14 1501    Exercises   Exercises Shoulder   Shoulder Exercises: Supine   Protraction PROM;5 reps;Strengthening;12 reps   Protraction Weight (lbs) 1   Horizontal ABduction PROM;5 reps;Strengthening;12 reps   Horizontal ABduction Weight (lbs) 1   External Rotation PROM;5 reps;Strengthening;12 reps   External Rotation Weight (lbs) 1   Internal Rotation PROM;5 reps;Strengthening;12 reps   Internal Rotation Weight (lbs) 1   Flexion PROM;5 reps;Strengthening;12 reps   Shoulder Flexion Weight (lbs) 1   ABduction PROM;5 reps;Strengthening;12 reps   Shoulder ABduction Weight (lbs) 1   ABduction Limitations able to achieve 60% range.   Shoulder Exercises: ROM/Strengthening   Rebounder UBE 3' forward 3' reverse   Rhythmic Stabilization, Supine 25 reps at 90 degrees, mod difficulty resisting min-mod resistance                  OT Short Term Goals - 07/13/14 1532  OT SHORT TERM GOAL #1   Title Patient will be educated on HEP.   Status Achieved   OT SHORT TERM GOAL #2   Title Patient will have WFL PROM in her left shoulder in order to don and doff shirts and coats with increased ease.   Status Achieved   OT SHORT TERM GOAL #3   Title Patient will improve left shoulder strength to 3+/5 for increased ability to lift bags of groceries.    Status Partially Met   OT SHORT TERM GOAL #4   Title Patient will decrease pain to 4/10 or better in left shoulder during functional activities.    Status Achieved   OT SHORT TERM GOAL #5   Title Patient will decrease fascial restrictions to moderate in her left shoulder.   Status Achieved           OT Long Term Goals - 06/17/14 1641    OT LONG TERM GOAL #1   Title  Patient will return to prior level of independence with all functional activities and leisure activities.    Status On-going   OT LONG TERM GOAL #2   Title Patient will have WFL AROM in her left shoulder for increased ability to braid and wash her hair.   Status On-going   OT LONG TERM GOAL #3   Title Patient will improve left shoulder strength to 4+/5 for increased ability to lift pots an pans when cooking.    Status On-going   OT LONG TERM GOAL #4   Title Patient will decrease pain to 2/10 in left shoulder while completing functional activities.    Status On-going   OT LONG TERM GOAL #5   Title Patient will decrease fascial restrictions in her left shoulder to minimal.   Status On-going               Plan - 07/27/14 1611    Clinical Impression Statement A: Increased UBE to 6', pt had min-mod difficulty with rhymic stabilization this session. Pt reports she does her HEP even though it hurts sometimes. Pt tolerated treatment well, all exercises not completed due to time restraints-pt arrived 15 min late to session.    Plan P: Resume missed exercises, continue working to increase PROM.         Problem List Patient Active Problem List   Diagnosis Date Noted  . HTN (hypertension) 04/17/2014  . Dry eyes 04/14/2014  . Bleeding from the nose 04/01/2014  . Renal insufficiency 04/01/2014  . Rotator cuff arthropathy   . Muscle weakness (generalized) 05/26/2013  . Pain in joint, shoulder region 05/26/2013  . Rotator cuff syndrome 05/22/2013    Guadelupe Sabin, OTR/L  [phone]  07/27/2014, 4:14 PM  [place] [address] [place], [place], [address] Phone: [phone]   Fax:  [phone]

## 2014-08-04 ENCOUNTER — Telehealth: Payer: Self-pay | Admitting: Orthopedic Surgery

## 2014-08-04 ENCOUNTER — Ambulatory Visit (HOSPITAL_COMMUNITY): Payer: Commercial Managed Care - HMO | Admitting: Occupational Therapy

## 2014-08-04 ENCOUNTER — Encounter (HOSPITAL_COMMUNITY): Payer: Self-pay | Admitting: Occupational Therapy

## 2014-08-04 DIAGNOSIS — M6281 Muscle weakness (generalized): Secondary | ICD-10-CM | POA: Diagnosis not present

## 2014-08-04 DIAGNOSIS — M25512 Pain in left shoulder: Secondary | ICD-10-CM

## 2014-08-04 DIAGNOSIS — Z471 Aftercare following joint replacement surgery: Secondary | ICD-10-CM | POA: Diagnosis not present

## 2014-08-04 DIAGNOSIS — Z96612 Presence of left artificial shoulder joint: Secondary | ICD-10-CM | POA: Diagnosis not present

## 2014-08-04 DIAGNOSIS — M2560 Stiffness of unspecified joint, not elsewhere classified: Secondary | ICD-10-CM

## 2014-08-04 DIAGNOSIS — M25612 Stiffness of left shoulder, not elsewhere classified: Secondary | ICD-10-CM | POA: Diagnosis not present

## 2014-08-04 NOTE — Telephone Encounter (Signed)
Patient called to request refill of medication: acetaminophen-codeine (TYLENOL #3) 300-30 MG per tablet [[id number]] - her ph# is [phone]

## 2014-08-04 NOTE — Therapy (Signed)
[place], [place], [address] Phone: [phone]   Fax:  [phone]  Occupational Therapy Treatment  Patient Details  Name: Heather [name] MRN: [id number] Date of Birth: August 16, 1940 Referring Provider:  Carole Civil, MD  Encounter Date: 08/04/2014      OT End of Session - 08/04/14 1155    Visit Number 16   Number of Visits 24   Date for OT Re-Evaluation 09/11/14  6/8 mini reassess   Authorization Type [employer]   Authorization Time Period no authorization required update g code 18th visit   Authorization - Visit Number 16   Authorization - Number of Visits 18   OT Start Time 1105   OT Stop Time 1147   OT Time Calculation (min) 42 min   Activity Tolerance Patient tolerated treatment well   Behavior During Therapy [facility] for tasks assessed/performed      Past Medical History  Diagnosis Date  . HTN (hypertension)   . COPD (chronic obstructive pulmonary disease)   . GERD (gastroesophageal reflux disease)   . Glaucoma   . Anxiety   . Depression   . Anemia   . Arthritis   . Gout     Past Surgical History  Procedure Laterality Date  . Joint replacement Bilateral     hip   . Joint replacement Right     knee  . Shoulder arthroscopy Right   . Abdominal hysterectomy    . Foot surgery Left     repair of "kissing Cousins"  . Bunionectomy Bilateral   . Back surgery      lumbar-disc  . Shoulder hemi-arthroplasty Left 03/25/2014    Procedure: LEFT SHOULDER HEMI-ARTHROPLASTY;  Surgeon: Carole Civil, MD;  Location: [facility];  Service: Orthopedics;  Laterality: Left;    There were no vitals filed for this visit.  Visit Diagnosis:  Shoulder pain, left  Limited joint range of motion  Decreased muscle strength      Subjective Assessment - 08/04/14 1107    Subjective  S: My arm started hurting on Friday after I did all my exercises.    Currently in Pain? Yes   Pain Score 5    Pain  Location Shoulder   Pain Orientation Left   Pain Descriptors / Indicators Aching   Pain Type Acute pain            [facility] OT Assessment - 08/04/14 1106    Assessment   Diagnosis S/P Left Total Shoulder Replacement   Precautions   Precautions Shoulder   Type of Shoulder Precautions progress as tolerated           OT Treatments/Exercises (OP) - 08/04/14 1108    Exercises   Exercises Shoulder   Shoulder Exercises: Supine   Protraction PROM;5 reps;Strengthening;12 reps   Protraction Weight (lbs) 1   Horizontal ABduction PROM;5 reps;Strengthening;12 reps   Horizontal ABduction Weight (lbs) 1   External Rotation PROM;5 reps;Strengthening;12 reps   External Rotation Weight (lbs) 1   Internal Rotation PROM;5 reps;Strengthening;12 reps   Internal Rotation Weight (lbs) 1   Flexion PROM;5 reps;Strengthening;12 reps   Shoulder Flexion Weight (lbs) 1   ABduction PROM;5 reps;Strengthening;12 reps   Shoulder ABduction Weight (lbs) 1   Shoulder Exercises: Seated   Protraction AROM;10 reps   Protraction Limitations verbal and tactile cuing to depress shoulder   Horizontal ABduction AROM;10 reps   Horizontal ABduction Limitations verbal cuing to depress shoulder   External Rotation AROM;10  reps   Internal Rotation AROM;10 reps   Flexion AROM;10 reps   Flexion Weight (lbs) verbal and tactile cuing to depress shoulder   Abduction AROM;10 reps   ABduction Limitations <50% range, verbal cuing to depress shoulder   Shoulder Exercises: ROM/Strengthening   Rebounder UBE Level 2 3' forward 3' reverse   Proximal Shoulder Strengthening, Supine 10X with 1# weight, no rest break   Rhythmic Stabilization, Supine 20 reps at 45 & 90, min resistance, mod-max difficulty with 1# weight   Manual Therapy   Manual Therapy Myofascial release   Myofascial Release Myofascial release to left upper arm, trapezius, and scapularis region to decrease joint mobility and increase joint mobility in a pain free  zone.           OT Short Term Goals - 07/13/14 1532    OT SHORT TERM GOAL #1   Title Patient will be educated on HEP.   Status Achieved   OT SHORT TERM GOAL #2   Title Patient will have WFL PROM in her left shoulder in order to don and doff shirts and coats with increased ease.   Status Achieved   OT SHORT TERM GOAL #3   Title Patient will improve left shoulder strength to 3+/5 for increased ability to lift bags of groceries.    Status Partially Met   OT SHORT TERM GOAL #4   Title Patient will decrease pain to 4/10 or better in left shoulder during functional activities.    Status Achieved   OT SHORT TERM GOAL #5   Title Patient will decrease fascial restrictions to moderate in her left shoulder.   Status Achieved           OT Long Term Goals - 06/17/14 1641    OT LONG TERM GOAL #1   Title Patient will return to prior level of independence with all functional activities and leisure activities.    Status On-going   OT LONG TERM GOAL #2   Title Patient will have WFL AROM in her left shoulder for increased ability to braid and wash her hair.   Status On-going   OT LONG TERM GOAL #3   Title Patient will improve left shoulder strength to 4+/5 for increased ability to lift pots an pans when cooking.    Status On-going   OT LONG TERM GOAL #4   Title Patient will decrease pain to 2/10 in left shoulder while completing functional activities.    Status On-going   OT LONG TERM GOAL #5   Title Patient will decrease fascial restrictions in her left shoulder to minimal.   Status On-going               Plan - 08/04/14 1155    Clinical Impression Statement A: Resumed missed exercises. Pt reports her shoulder being sore after completing her exercises and using her arm to wash her hair on Friday. Pt believes she "overworked it." Pt continues to have discomfort and "catching sensation" during abduction, pain is relieved after short rest break. Pt tolerated treatment well.     Plan P: UPDATE G-CODE. Mini-reassessment. Add flexion/abduction wall stretches.         Problem List Patient Active Problem List   Diagnosis Date Noted  . HTN (hypertension) 04/17/2014  . Dry eyes 04/14/2014  . Bleeding from the nose 04/01/2014  . Renal insufficiency 04/01/2014  . Rotator cuff arthropathy   . Muscle weakness (generalized) 05/26/2013  . Pain in joint, shoulder region 05/26/2013  . Rotator  cuff syndrome 05/22/2013    Guadelupe Sabin, OTR/L  [phone]  08/04/2014, 11:58 AM  [place], [place], [address] Phone: [phone]   Fax:  [phone]

## 2014-08-05 ENCOUNTER — Other Ambulatory Visit: Payer: Self-pay | Admitting: *Deleted

## 2014-08-05 MED ORDER — ACETAMINOPHEN-CODEINE #3 300-30 MG PO TABS: 1.0000 | ORAL_TABLET | ORAL | Status: DC | PRN

## 2014-08-05 NOTE — Telephone Encounter (Signed)
Prescription faxed to [facility], patient aware

## 2014-08-14 ENCOUNTER — Ambulatory Visit (HOSPITAL_COMMUNITY): Payer: Commercial Managed Care - HMO | Attending: Orthopedic Surgery | Admitting: Occupational Therapy

## 2014-08-14 ENCOUNTER — Encounter (HOSPITAL_COMMUNITY): Payer: Self-pay | Admitting: Occupational Therapy

## 2014-08-14 DIAGNOSIS — M25512 Pain in left shoulder: Secondary | ICD-10-CM | POA: Insufficient documentation

## 2014-08-14 DIAGNOSIS — Z471 Aftercare following joint replacement surgery: Secondary | ICD-10-CM | POA: Insufficient documentation

## 2014-08-14 DIAGNOSIS — M25612 Stiffness of left shoulder, not elsewhere classified: Secondary | ICD-10-CM | POA: Insufficient documentation

## 2014-08-14 DIAGNOSIS — M2560 Stiffness of unspecified joint, not elsewhere classified: Secondary | ICD-10-CM

## 2014-08-14 DIAGNOSIS — Z96612 Presence of left artificial shoulder joint: Secondary | ICD-10-CM | POA: Insufficient documentation

## 2014-08-14 DIAGNOSIS — M6281 Muscle weakness (generalized): Secondary | ICD-10-CM | POA: Insufficient documentation

## 2014-08-14 NOTE — Therapy (Signed)
[place], [place], [address] Phone: [phone]   Fax:  [phone]  Occupational Therapy Evaluation  Patient Details  Name: Heather [name] MRN: [id number] Date of Birth: 1940-12-16 Referring Provider:  Carole Civil, MD  Encounter Date: 08/14/2014      OT End of Session - 08/14/14 1203    Visit Number 17   Number of Visits 24   Date for OT Re-Evaluation 09/11/14   Authorization Type [employer]   Authorization Time Period no authorization required update g code 27th visit   Authorization - Visit Number 17   Authorization - Number of Visits 27   OT Start Time 1100   OT Stop Time 1147   OT Time Calculation (min) 47 min   Activity Tolerance Patient tolerated treatment well   Behavior During Therapy [facility] for tasks assessed/performed      Past Medical History  Diagnosis Date  . HTN (hypertension)   . COPD (chronic obstructive pulmonary disease)   . GERD (gastroesophageal reflux disease)   . Glaucoma   . Anxiety   . Depression   . Anemia   . Arthritis   . Gout     Past Surgical History  Procedure Laterality Date  . Joint replacement Bilateral     hip   . Joint replacement Right     knee  . Shoulder arthroscopy Right   . Abdominal hysterectomy    . Foot surgery Left     repair of "kissing Cousins"  . Bunionectomy Bilateral   . Back surgery      lumbar-disc  . Shoulder hemi-arthroplasty Left 03/25/2014    Procedure: LEFT SHOULDER HEMI-ARTHROPLASTY;  Surgeon: Carole Civil, MD;  Location: [facility];  Service: Orthopedics;  Laterality: Left;    There were no vitals filed for this visit.  Visit Diagnosis:  Shoulder pain, left  Limited joint range of motion  Decreased muscle strength  Muscle weakness (generalized)      Subjective Assessment - 08/14/14 1102    Subjective  S: I took pain medication before coming today.    Currently in Pain? No/denies           [facility] OT  Assessment - 08/14/14 1117    Assessment   Diagnosis S/P Left Total Shoulder Replacement   Precautions   Precautions Shoulder   Type of Shoulder Precautions progress as tolerated   Observation/Other Assessments   Quick DASH  27.27  previous 38.64   Palpation   Palpation comment Min fascial restrictions in left upper arm & scapularis regions   AROM   Overall AROM Comments assessed in supine, ER/IR with shoulder adducted (seated AROM) (supine AROM from 06/10/14)   Left Shoulder Flexion 141 Degrees  previous 132   Left Shoulder ABduction 115 Degrees  previous 94 (06/10/2014)   Left Shoulder Internal Rotation 90 Degrees  same as previous   Left Shoulder External Rotation 74 Degrees  previous 63   PROM   Overall PROM Comments WFL in supine   Strength   Overall Strength Comments assessed seated. IR/ER adducted   Strength Assessment Site Shoulder   Right/Left Shoulder Left   Left Shoulder Flexion 3+/5  3/5 previous   Left Shoulder ABduction 3/5  same as previous   Left Shoulder Internal Rotation 4-/5  3+/5 previous   Left Shoulder External Rotation 4-/5  same as previous             Quick Dash - 08/14/14 1139  Open a tight or new jar Mild difficulty   Do heavy household chores (wash walls, wash floors) Mild difficulty   Carry a shopping bag or briefcase No difficulty   Wash your back Unable   Use a knife to cut food Mild difficulty   Recreational activities in which you take some force or impact through your arm, shoulder, or hand (golf, hammering, tennis) Moderate difficulty   During the past week, to what extent has your arm, shoulder or hand problem interfered with your normal social activities with family, friends, neighbors, or groups? Not at all   During the past week, to what extent has your arm, shoulder or hand problem limited your work or other regular daily activities Not at all   Arm, shoulder, or hand pain. Moderate   Tingling (pins and needles) in your arm,  shoulder, or hand None   Difficulty Sleeping Mild difficulty   DASH Score 27.27 %             OT Treatments/Exercises (OP) - 08/14/14 1102    Exercises   Exercises Shoulder   Shoulder Exercises: Supine   Protraction PROM;5 reps   Horizontal ABduction PROM;5 reps   External Rotation PROM;5 reps   Internal Rotation PROM;5 reps   Flexion PROM;5 reps   ABduction PROM;5 reps   Shoulder Exercises: Seated   Protraction AROM;10 reps   Protraction Limitations verbal and tactile cuing to depress shoulder   External Rotation AROM;10 reps   Internal Rotation AROM;10 reps   Flexion AROM;10 reps   Flexion Weight (lbs) verbal and tactile cuing to depress shoulder   Abduction AROM;10 reps   ABduction Limitations <50% range, verbal cuing to depress shoulder   Shoulder Exercises: Stretch   Wall Stretch - Flexion 3 reps;10 seconds   Wall Stretch - ABduction 3 reps;10 seconds   Manual Therapy   Manual Therapy Myofascial release   Myofascial Release Myofascial release to left upper arm, trapezius, and scapularis region to decrease joint mobility and increase joint mobility in a pain free zone.                 OT Short Term Goals - 08/14/14 1124    OT SHORT TERM GOAL #1   Title Patient will be educated on HEP.   Status Achieved   OT SHORT TERM GOAL #2   Title Patient will have WFL PROM in her left shoulder in order to don and doff shirts and coats with increased ease.   Status Achieved   OT SHORT TERM GOAL #3   Title Patient will improve left shoulder strength to 3+/5 for increased ability to lift bags of groceries.    Status Partially Met   OT SHORT TERM GOAL #4   Title Patient will decrease pain to 4/10 or better in left shoulder during functional activities.    Status Achieved   OT SHORT TERM GOAL #5   Title Patient will decrease fascial restrictions to moderate in her left shoulder.   Status Achieved           OT Long Term Goals - 08/14/14 1124    OT LONG TERM  GOAL #1   Title Patient will return to prior level of independence with all functional activities and leisure activities.    Status On-going   OT LONG TERM GOAL #2   Title Patient will have WFL AROM in her left shoulder for increased ability to braid and wash her hair.   Status On-going   OT  LONG TERM GOAL #3   Title Patient will improve left shoulder strength to 4+/5 for increased ability to lift pots an pans when cooking.    Status On-going   OT LONG TERM GOAL #4   Title Patient will decrease pain to 2/10 in left shoulder while completing functional activities.    Status On-going   OT LONG TERM GOAL #5   Title Patient will decrease fascial restrictions in her left shoulder to minimal.   Status Achieved               Plan - Aug 26, 2014 1203    Clinical Impression Statement A: Mini-reassesment completed this visit. Pt has improved range of motion and strength since previous measurements. Pt has met 1/5 LTGs and continues to progress towards remaining goals. Pt reports she is having less pain with her stretches at home. Added wall flexion/abduction stretches this session. DASH score: 27.27% impairment   Plan P: Resume missed exercises, follow up on stretches at home.           G-Codes - 08-26-2014 1206/05/11    Functional Assessment Tool Used DASH Score: 27.27% impairment   Functional Limitation Carrying, moving and handling objects   Carrying, Moving and Handling Objects Current Status [phone]) At least 20 percent but less than 40 percent impaired, limited or restricted   Carrying, Moving and Handling Objects Goal Status ([id number]) At least 1 percent but less than 20 percent impaired, limited or restricted      Problem List Patient Active Problem List   Diagnosis Date Noted  . HTN (hypertension) 04/17/2014  . Dry eyes 04/14/2014  . Bleeding from the nose 04/01/2014  . Renal insufficiency 04/01/2014  . Rotator cuff arthropathy   . Muscle weakness (generalized) 05/26/2013  . Pain in  joint, shoulder region 05/26/2013  . Rotator cuff syndrome 05/22/2013    Guadelupe Sabin, OTR/L  [phone]  Aug 26, 2014, 12:09 PM  [place] [address] [place], [place], [address] Phone: [phone]   Fax:  [phone]

## 2014-08-18 ENCOUNTER — Telehealth: Payer: Self-pay | Admitting: Orthopedic Surgery

## 2014-08-18 ENCOUNTER — Encounter: Payer: Self-pay | Admitting: Orthopedic Surgery

## 2014-08-18 ENCOUNTER — Ambulatory Visit (INDEPENDENT_AMBULATORY_CARE_PROVIDER_SITE_OTHER): Payer: Commercial Managed Care - HMO | Admitting: Orthopedic Surgery

## 2014-08-18 VITALS — BP 127/61 | Ht 63.0 in | Wt 188.0 lb

## 2014-08-18 DIAGNOSIS — Z96612 Presence of left artificial shoulder joint: Secondary | ICD-10-CM

## 2014-08-18 NOTE — Telephone Encounter (Signed)
Per referral from patient's primary care, Dr Felecia Shelling for new problem of hip pain, patient's records of previous hip surgery were received and reviewed by Dr Romeo Apple, and appointment was approved for 09/02/14.  Spoke with patient at the time of her regularly scheduled appointment for her shoulder today, 08/18/14.  Offered patient this appointment; however, she wishes to wait.  Notified primary care, as [employer] referral is required.

## 2014-08-18 NOTE — Progress Notes (Signed)
Patient ID: Heather [name], female   DOB: 16-Oct-1940, 74 y.o.   MRN: [id number] Month follow-up visit left shoulder status post CTA partial replacement  Patient doing well feels mild crepitance in the shoulder  She has 45 of external rotation 100 of forward elevation 90 of abduction passively. She is able to perform activities of daily living most of her pain has resolved she has mild discomfort in the left shoulder depending on activity  Follow-up January 2017 for [facility] and lateral left shoulder

## 2014-08-21 ENCOUNTER — Encounter (HOSPITAL_COMMUNITY): Payer: Self-pay

## 2014-08-21 ENCOUNTER — Ambulatory Visit (HOSPITAL_COMMUNITY): Payer: Commercial Managed Care - HMO

## 2014-08-21 DIAGNOSIS — M6281 Muscle weakness (generalized): Secondary | ICD-10-CM | POA: Diagnosis not present

## 2014-08-21 DIAGNOSIS — Z96612 Presence of left artificial shoulder joint: Secondary | ICD-10-CM | POA: Diagnosis not present

## 2014-08-21 DIAGNOSIS — M25512 Pain in left shoulder: Secondary | ICD-10-CM | POA: Diagnosis not present

## 2014-08-21 DIAGNOSIS — Z471 Aftercare following joint replacement surgery: Secondary | ICD-10-CM | POA: Diagnosis not present

## 2014-08-21 DIAGNOSIS — M25612 Stiffness of left shoulder, not elsewhere classified: Secondary | ICD-10-CM | POA: Diagnosis not present

## 2014-08-21 DIAGNOSIS — M6289 Other specified disorders of muscle: Secondary | ICD-10-CM

## 2014-08-21 DIAGNOSIS — M629 Disorder of muscle, unspecified: Secondary | ICD-10-CM

## 2014-08-21 NOTE — Therapy (Signed)
[place], [place], [address] Phone: [phone]   Fax:  [phone]  Occupational Therapy Treatment  Patient Details  Name: Heather [name] MRN: [id number] Date of Birth: 25-Sep-1940 Referring Provider:  Carole Civil, MD  Encounter Date: 08/21/2014      OT End of Session - 08/21/14 1157    Visit Number 18   Number of Visits 24   Date for OT Re-Evaluation 09/11/14   Authorization Type [employer]   Authorization Time Period no authorization required update g code 27th visit   Authorization - Visit Number 18   Authorization - Number of Visits 27   OT Start Time 1100   OT Stop Time 1147   OT Time Calculation (min) 47 min   Activity Tolerance Patient tolerated treatment well   Behavior During Therapy [facility] for tasks assessed/performed      Past Medical History  Diagnosis Date  . HTN (hypertension)   . COPD (chronic obstructive pulmonary disease)   . GERD (gastroesophageal reflux disease)   . Glaucoma   . Anxiety   . Depression   . Anemia   . Arthritis   . Gout     Past Surgical History  Procedure Laterality Date  . Joint replacement Bilateral     hip   . Joint replacement Right     knee  . Shoulder arthroscopy Right   . Abdominal hysterectomy    . Foot surgery Left     repair of "kissing Cousins"  . Bunionectomy Bilateral   . Back surgery      lumbar-disc  . Shoulder hemi-arthroplasty Left 03/25/2014    Procedure: LEFT SHOULDER HEMI-ARTHROPLASTY;  Surgeon: Carole Civil, MD;  Location: [facility];  Service: Orthopedics;  Laterality: Left;    There were no vitals filed for this visit.  Visit Diagnosis:  Decreased muscle strength  Tight fascia  Stiffness of shoulder joint, left      Subjective Assessment - 08/21/14 1102    Subjective  S: The doctor said the crunching that it happening is from the hardware and in about a year it shouldn't be doing it.    Currently in Pain?  No/denies            [facility] OT Assessment - 08/21/14 1113    Assessment   Diagnosis S/P Left Total Shoulder Replacement   Precautions   Precautions Shoulder   Type of Shoulder Precautions progress as tolerated                  OT Treatments/Exercises (OP) - 08/21/14 1114    Exercises   Exercises Shoulder   Shoulder Exercises: Supine   Protraction PROM;5 reps;Strengthening;12 reps   Protraction Weight (lbs) 1   Horizontal ABduction PROM;5 reps;Strengthening;12 reps   Horizontal ABduction Weight (lbs) 1   External Rotation PROM;5 reps;Strengthening;12 reps   External Rotation Weight (lbs) 1   Internal Rotation PROM;5 reps;Strengthening;12 reps   Internal Rotation Weight (lbs) 1   Flexion PROM;5 reps;Strengthening;12 reps   Shoulder Flexion Weight (lbs) 1   ABduction PROM;5 reps;Strengthening;12 reps   Shoulder ABduction Weight (lbs) 1   Shoulder Exercises: Seated   Protraction Strengthening;10 reps   Protraction Weight (lbs) 1   Horizontal ABduction AROM;10 reps   External Rotation Strengthening;10 reps   External Rotation Weight (lbs) 1   Internal Rotation Strengthening;10 reps   Internal Rotation Weight (lbs) 1   Flexion Strengthening;10 reps   Flexion Weight (lbs) 1  Flexion Limitations Only able to achieve 30-40% range   Abduction AROM;10 reps   ABduction Limitations 49% range. Just short of 50% this session   Shoulder Exercises: ROM/Strengthening   Rebounder Level 2 3' forward 3' reverse   Over Head Lace 1'15"   Wall Pushups 10 reps   Proximal Shoulder Strengthening, Supine 10X with 1# weight, no rest break   Proximal Shoulder Strengthening, Seated 10X 1 rest break   Other ROM/Strengthening Exercises Cybex Cable Column: Shoulder extension/lower trapezius pull down; 1 plate; 10X. Cable upright row; no plate; 10X   Manual Therapy   Manual Therapy Myofascial release;Muscle Energy Technique   Myofascial Release Myofascial release to left upper arm,  trapezius, and scapularis region to decrease joint mobility and increase joint mobility in a pain free zone.   Muscle Energy Technique Muscle energy technique to left anterior deltoid to relax tone and muscle spasm and improve range of motion.                   OT Short Term Goals - 08/21/14 1118    OT SHORT TERM GOAL #1   Title Patient will be educated on HEP.   OT SHORT TERM GOAL #2   Title Patient will have WFL PROM in her left shoulder in order to don and doff shirts and coats with increased ease.   OT SHORT TERM GOAL #3   Title Patient will improve left shoulder strength to 3+/5 for increased ability to lift bags of groceries.    Status Partially Met   OT SHORT TERM GOAL #4   Title Patient will decrease pain to 4/10 or better in left shoulder during functional activities.    OT SHORT TERM GOAL #5   Title Patient will decrease fascial restrictions to moderate in her left shoulder.           OT Long Term Goals - 08/21/14 1118    OT LONG TERM GOAL #1   Title Patient will return to prior level of independence with all functional activities and leisure activities.    Status On-going   OT LONG TERM GOAL #2   Title Patient will have WFL AROM in her left shoulder for increased ability to braid and wash her hair.   Status On-going   OT LONG TERM GOAL #3   Title Patient will improve left shoulder strength to 4+/5 for increased ability to lift pots an pans when cooking.    Status On-going   OT LONG TERM GOAL #4   Title Patient will decrease pain to 2/10 in left shoulder while completing functional activities.    Status On-going   OT LONG TERM GOAL #5   Title Patient will decrease fascial restrictions in her left shoulder to minimal.               Plan - 08/21/14 1158    Clinical Impression Statement A: Resumed missed exercises. Add wall push ups, Cybex column machine, overhead lacing. Pt tolerated well. Pt states she is doing her stretches at home without  difficulty. Initiated muscle energy technique to increase PROM and patient had great results.    Plan P: cont with muscle energy technique and cybex cable column for strengthening.        Problem List Patient Active Problem List   Diagnosis Date Noted  . HTN (hypertension) 04/17/2014  . Dry eyes 04/14/2014  . Bleeding from the nose 04/01/2014  . Renal insufficiency 04/01/2014  . Rotator cuff arthropathy   .  Muscle weakness (generalized) 05/26/2013  . Pain in joint, shoulder region 05/26/2013  . Rotator cuff syndrome 05/22/2013    Ailene Ravel, OTR/L,CBIS  [phone]  08/21/2014, 12:00 PM  [place], [place], [address] Phone: [phone]   Fax:  [phone]

## 2014-08-24 DIAGNOSIS — J449 Chronic obstructive pulmonary disease, unspecified: Secondary | ICD-10-CM | POA: Diagnosis not present

## 2014-08-24 DIAGNOSIS — I10 Essential (primary) hypertension: Secondary | ICD-10-CM | POA: Diagnosis not present

## 2014-08-26 DIAGNOSIS — L209 Atopic dermatitis, unspecified: Secondary | ICD-10-CM | POA: Diagnosis not present

## 2014-08-26 DIAGNOSIS — J449 Chronic obstructive pulmonary disease, unspecified: Secondary | ICD-10-CM | POA: Diagnosis not present

## 2014-08-28 ENCOUNTER — Encounter (HOSPITAL_COMMUNITY): Payer: Self-pay | Admitting: Occupational Therapy

## 2014-08-28 ENCOUNTER — Ambulatory Visit (HOSPITAL_COMMUNITY): Payer: Commercial Managed Care - HMO | Admitting: Occupational Therapy

## 2014-08-28 DIAGNOSIS — M25612 Stiffness of left shoulder, not elsewhere classified: Secondary | ICD-10-CM

## 2014-08-28 DIAGNOSIS — Z471 Aftercare following joint replacement surgery: Secondary | ICD-10-CM | POA: Diagnosis not present

## 2014-08-28 DIAGNOSIS — M25512 Pain in left shoulder: Secondary | ICD-10-CM

## 2014-08-28 DIAGNOSIS — M6281 Muscle weakness (generalized): Secondary | ICD-10-CM | POA: Diagnosis not present

## 2014-08-28 DIAGNOSIS — M629 Disorder of muscle, unspecified: Secondary | ICD-10-CM

## 2014-08-28 DIAGNOSIS — M6289 Other specified disorders of muscle: Secondary | ICD-10-CM

## 2014-08-28 DIAGNOSIS — M2560 Stiffness of unspecified joint, not elsewhere classified: Secondary | ICD-10-CM

## 2014-08-28 DIAGNOSIS — Z96612 Presence of left artificial shoulder joint: Secondary | ICD-10-CM | POA: Diagnosis not present

## 2014-08-28 NOTE — Therapy (Signed)
[place], [place], [address] Phone: [phone]   Fax:  [phone]  Occupational Therapy Treatment  Patient Details  Name: Heather [name] MRN: [id number] Date of Birth: 1940/07/06 Referring Provider:  Rosita Fire, MD  Encounter Date: 08/28/2014      OT End of Session - 08/28/14 1153    Visit Number 19   Number of Visits 24   Date for OT Re-Evaluation 09/11/14   Authorization Type [employer]   Authorization Time Period no authorization required update g code 27th visit   Authorization - Visit Number 19   Authorization - Number of Visits 27   OT Start Time 1105   OT Stop Time 1152   OT Time Calculation (min) 47 min   Activity Tolerance Patient tolerated treatment well   Behavior During Therapy [facility] for tasks assessed/performed      Past Medical History  Diagnosis Date  . HTN (hypertension)   . COPD (chronic obstructive pulmonary disease)   . GERD (gastroesophageal reflux disease)   . Glaucoma   . Anxiety   . Depression   . Anemia   . Arthritis   . Gout     Past Surgical History  Procedure Laterality Date  . Joint replacement Bilateral     hip   . Joint replacement Right     knee  . Shoulder arthroscopy Right   . Abdominal hysterectomy    . Foot surgery Left     repair of "kissing Cousins"  . Bunionectomy Bilateral   . Back surgery      lumbar-disc  . Shoulder hemi-arthroplasty Left 03/25/2014    Procedure: LEFT SHOULDER HEMI-ARTHROPLASTY;  Surgeon: Carole Civil, MD;  Location: [facility];  Service: Orthopedics;  Laterality: Left;    There were no vitals filed for this visit.  Visit Diagnosis:  Decreased muscle strength  Tight fascia  Stiffness of shoulder joint, left  Shoulder pain, left  Limited joint range of motion      Subjective Assessment - 08/28/14 1106    Subjective  S: I believe this shoulder is doing better. I'm using it more I think.    Currently in Pain?  No/denies            [facility] OT Assessment - 08/28/14 1105    Assessment   Diagnosis S/P Left Total Shoulder Replacement   Precautions   Precautions Shoulder   Type of Shoulder Precautions progress as tolerated          OT Treatments/Exercises (OP) - 08/28/14 1106    Exercises   Exercises Shoulder   Shoulder Exercises: Supine   Protraction PROM;5 reps;Strengthening;10 reps   Protraction Weight (lbs) 2   Horizontal ABduction PROM;5 reps;Strengthening;10 reps   Horizontal ABduction Weight (lbs) 2   External Rotation PROM;5 reps;Strengthening;10 reps   External Rotation Weight (lbs) 2   Internal Rotation PROM;5 reps;Strengthening;10 reps   Internal Rotation Weight (lbs) 2   Flexion PROM;5 reps;Strengthening;10 reps   Shoulder Flexion Weight (lbs) 2   ABduction PROM;5 reps;Strengthening;10 reps   Shoulder ABduction Weight (lbs) 2   Shoulder Exercises: Seated   Protraction Strengthening;10 reps   Protraction Weight (lbs) 1   Horizontal ABduction AROM;10 reps   External Rotation Strengthening;10 reps   External Rotation Weight (lbs) 1   Internal Rotation Strengthening;10 reps   Internal Rotation Weight (lbs) 1   Flexion Strengthening;10 reps   Flexion Weight (lbs) 1   Flexion Limitations Only able to achieve  30-40% range   Abduction AROM;10 reps   ABduction Limitations 49% range. Just short of 50% this session   Shoulder Exercises: ROM/Strengthening   Rebounder Level 2 3' forward 3' reverse   Wall Pushups 10 reps   Proximal Shoulder Strengthening, Supine 10X with 1# weight, no rest break   Proximal Shoulder Strengthening, Seated 10X no rest break   Other ROM/Strengthening Exercises cybex cable column: cable upright row 1# plate, 10X   Manual Therapy   Manual Therapy Myofascial release;Muscle Energy Technique   Myofascial Release Myofascial release to left upper arm, trapezius, and scapularis region to decrease joint mobility and increase joint mobility in a pain free  zone.   Muscle Energy Technique Muscle energy technique to left anterior deltoid to relax tone and muscle spasm and improve range of motion.             OT Short Term Goals - 08/21/14 1118    OT SHORT TERM GOAL #1   Title Patient will be educated on HEP.   OT SHORT TERM GOAL #2   Title Patient will have WFL PROM in her left shoulder in order to don and doff shirts and coats with increased ease.   OT SHORT TERM GOAL #3   Title Patient will improve left shoulder strength to 3+/5 for increased ability to lift bags of groceries.    Status Partially Met   OT SHORT TERM GOAL #4   Title Patient will decrease pain to 4/10 or better in left shoulder during functional activities.    OT SHORT TERM GOAL #5   Title Patient will decrease fascial restrictions to moderate in her left shoulder.           OT Long Term Goals - 08/21/14 1118    OT LONG TERM GOAL #1   Title Patient will return to prior level of independence with all functional activities and leisure activities.    Status On-going   OT LONG TERM GOAL #2   Title Patient will have WFL AROM in her left shoulder for increased ability to braid and wash her hair.   Status On-going   OT LONG TERM GOAL #3   Title Patient will improve left shoulder strength to 4+/5 for increased ability to lift pots an pans when cooking.    Status On-going   OT LONG TERM GOAL #4   Title Patient will decrease pain to 2/10 in left shoulder while completing functional activities.    Status On-going   OT LONG TERM GOAL #5   Title Patient will decrease fascial restrictions in her left shoulder to minimal.           Plan - 08/28/14 1153    Clinical Impression Statement A: Continued cybex machine-upright row. Increased weight to 2# during supine exercises. Pt reports she is trying to use her arm more at home and believes it is doing better. Pt tolerated treatment well.    Plan P: Continue with strengthening exercises, attempt x to v arms seated with no  weight         Problem List Patient Active Problem List   Diagnosis Date Noted  . HTN (hypertension) 04/17/2014  . Dry eyes 04/14/2014  . Bleeding from the nose 04/01/2014  . Renal insufficiency 04/01/2014  . Rotator cuff arthropathy   . Muscle weakness (generalized) 05/26/2013  . Pain in joint, shoulder region 05/26/2013  . Rotator cuff syndrome 05/22/2013    Guadelupe Sabin, OTR/L  [phone]  08/28/2014, 11:57 AM  [facility]  [place], [place], [address] Phone: [phone]   Fax:  [phone]

## 2014-09-04 ENCOUNTER — Ambulatory Visit (HOSPITAL_COMMUNITY): Payer: Commercial Managed Care - HMO | Attending: Orthopedic Surgery

## 2014-09-04 ENCOUNTER — Encounter (HOSPITAL_COMMUNITY): Payer: Self-pay

## 2014-09-04 DIAGNOSIS — M25512 Pain in left shoulder: Secondary | ICD-10-CM | POA: Diagnosis not present

## 2014-09-04 DIAGNOSIS — M6289 Other specified disorders of muscle: Secondary | ICD-10-CM

## 2014-09-04 DIAGNOSIS — M2560 Stiffness of unspecified joint, not elsewhere classified: Secondary | ICD-10-CM | POA: Diagnosis not present

## 2014-09-04 DIAGNOSIS — M25612 Stiffness of left shoulder, not elsewhere classified: Secondary | ICD-10-CM

## 2014-09-04 DIAGNOSIS — M629 Disorder of muscle, unspecified: Secondary | ICD-10-CM | POA: Insufficient documentation

## 2014-09-04 DIAGNOSIS — M6281 Muscle weakness (generalized): Secondary | ICD-10-CM | POA: Diagnosis not present

## 2014-09-04 NOTE — Therapy (Signed)
[place], [place], [address] Phone: [phone]   Fax:  [phone]  Occupational Therapy Treatment  Patient Details  Name: Heather [name] MRN: [id number] Date of Birth: 1940/09/28 Referring Provider:  Carole Civil, MD  Encounter Date: 09/04/2014      OT End of Session - 09/04/14 1150    Visit Number 20   Number of Visits 24   Date for OT Re-Evaluation 09/11/14   Authorization Type [employer]   Authorization Time Period no authorization required update g code 27th visit   Authorization - Visit Number 20   Authorization - Number of Visits 27   OT Start Time 1105   OT Stop Time 1152   OT Time Calculation (min) 47 min   Activity Tolerance Patient tolerated treatment well   Behavior During Therapy [facility] for tasks assessed/performed      Past Medical History  Diagnosis Date  . HTN (hypertension)   . COPD (chronic obstructive pulmonary disease)   . GERD (gastroesophageal reflux disease)   . Glaucoma   . Anxiety   . Depression   . Anemia   . Arthritis   . Gout     Past Surgical History  Procedure Laterality Date  . Joint replacement Bilateral     hip   . Joint replacement Right     knee  . Shoulder arthroscopy Right   . Abdominal hysterectomy    . Foot surgery Left     repair of "kissing Cousins"  . Bunionectomy Bilateral   . Back surgery      lumbar-disc  . Shoulder hemi-arthroplasty Left 03/25/2014    Procedure: LEFT SHOULDER HEMI-ARTHROPLASTY;  Surgeon: Carole Civil, MD;  Location: [facility];  Service: Orthopedics;  Laterality: Left;    There were no vitals filed for this visit.  Visit Diagnosis:  Decreased muscle strength  Tight fascia  Stiffness of shoulder joint, left      Subjective Assessment - 09/04/14 1131    Subjective  S: I wish I could stretch my shoulder like you can.   Currently in Pain? No/denies            [facility] OT Assessment - 09/04/14 1132    Assessment   Diagnosis S/P Left Total Shoulder Replacement   Precautions   Precautions Shoulder   Type of Shoulder Precautions progress as tolerated                  OT Treatments/Exercises (OP) - 09/04/14 1132    Exercises   Exercises Shoulder   Shoulder Exercises: Supine   Protraction PROM;5 reps;Strengthening;10 reps   Protraction Weight (lbs) 2   Horizontal ABduction PROM;5 reps;Strengthening;10 reps   Horizontal ABduction Weight (lbs) 2   External Rotation PROM;5 reps;Strengthening;10 reps   External Rotation Weight (lbs) 2   Internal Rotation PROM;5 reps;Strengthening;10 reps   Internal Rotation Weight (lbs) 2   Flexion PROM;5 reps;Strengthening;10 reps   Shoulder Flexion Weight (lbs) 2   ABduction PROM;5 reps;Strengthening;10 reps   Shoulder ABduction Weight (lbs) 2   Shoulder Exercises: Seated   Protraction Strengthening;10 reps   Protraction Weight (lbs) 1   Horizontal ABduction AROM;10 reps   External Rotation Strengthening;10 reps   External Rotation Weight (lbs) 1   Internal Rotation Strengthening;10 reps   Internal Rotation Weight (lbs) 1   Flexion Strengthening;10 reps   Flexion Weight (lbs) 1   Abduction Strengthening;10 reps   ABduction Weight (lbs) 1   ABduction  Limitations 49% range. Just short of 50% this session   Shoulder Exercises: ROM/Strengthening   Rebounder Level 2 3' forward 3' reverse   "W" Arms 10X   X to V Arms 10X  able to achieve 50% range   Proximal Shoulder Strengthening, Supine 10X with 2# weight, no rest break                  OT Short Term Goals - 08/21/14 1118    OT SHORT TERM GOAL #1   Title Patient will be educated on HEP.   OT SHORT TERM GOAL #2   Title Patient will have WFL PROM in her left shoulder in order to don and doff shirts and coats with increased ease.   OT SHORT TERM GOAL #3   Title Patient will improve left shoulder strength to 3+/5 for increased ability to lift bags of groceries.    Status  Partially Met   OT SHORT TERM GOAL #4   Title Patient will decrease pain to 4/10 or better in left shoulder during functional activities.    OT SHORT TERM GOAL #5   Title Patient will decrease fascial restrictions to moderate in her left shoulder.           OT Long Term Goals - 08/21/14 1118    OT LONG TERM GOAL #1   Title Patient will return to prior level of independence with all functional activities and leisure activities.    Status On-going   OT LONG TERM GOAL #2   Title Patient will have WFL AROM in her left shoulder for increased ability to braid and wash her hair.   Status On-going   OT LONG TERM GOAL #3   Title Patient will improve left shoulder strength to 4+/5 for increased ability to lift pots an pans when cooking.    Status On-going   OT LONG TERM GOAL #4   Title Patient will decrease pain to 2/10 in left shoulder while completing functional activities.    Status On-going   OT LONG TERM GOAL #5   Title Patient will decrease fascial restrictions in her left shoulder to minimal.               Plan - 09/04/14 1151    Clinical Impression Statement A: Added X to V arms and W arms without weight. Patient tolerated well with slow controlled movement.   Plan P: Reassess. Cont to use muscle energy technique to try and increase PROM needed to complete daily tasks. Cont with strengthening. Add ball on the wall.        Problem List Patient Active Problem List   Diagnosis Date Noted  . HTN (hypertension) 04/17/2014  . Dry eyes 04/14/2014  . Bleeding from the nose 04/01/2014  . Renal insufficiency 04/01/2014  . Rotator cuff arthropathy   . Muscle weakness (generalized) 05/26/2013  . Pain in joint, shoulder region 05/26/2013  . Rotator cuff syndrome 05/22/2013    Ailene Ravel, OTR/L,CBIS  [phone]  09/04/2014, 12:32 PM  [place] [address] [place], [place], [address] Phone: [phone]   Fax:   [phone]

## 2014-09-11 ENCOUNTER — Encounter (HOSPITAL_COMMUNITY): Payer: Self-pay | Admitting: Occupational Therapy

## 2014-09-11 ENCOUNTER — Ambulatory Visit (HOSPITAL_COMMUNITY): Payer: Commercial Managed Care - HMO | Admitting: Occupational Therapy

## 2014-09-11 DIAGNOSIS — M25512 Pain in left shoulder: Secondary | ICD-10-CM

## 2014-09-11 DIAGNOSIS — M25612 Stiffness of left shoulder, not elsewhere classified: Secondary | ICD-10-CM | POA: Diagnosis not present

## 2014-09-11 DIAGNOSIS — M6281 Muscle weakness (generalized): Secondary | ICD-10-CM | POA: Diagnosis not present

## 2014-09-11 DIAGNOSIS — M2560 Stiffness of unspecified joint, not elsewhere classified: Secondary | ICD-10-CM | POA: Diagnosis not present

## 2014-09-11 DIAGNOSIS — M629 Disorder of muscle, unspecified: Secondary | ICD-10-CM | POA: Diagnosis not present

## 2014-09-11 NOTE — Therapy (Signed)
[facility] [address] [place], [place], [address] Phone: [phone]   Fax:  [phone]  Occupational Therapy Reassessment & Treatment  Patient Details  Name: Heather [name] MRN: [id number] Date of Birth: 12/07/1940 Referring Provider:  Harrison, Stanley E, MD  Encounter Date: 09/11/2014      OT End of Session - 09/11/14 1241    Visit Number 21   Number of Visits 25   Date for OT Re-Evaluation 11/10/14  10/09/2014   Authorization Type [employer]   Authorization Time Period no authorization required update g code 27th visit   Authorization - Visit Number 21   Authorization - Number of Visits 27   OT Start Time 1105   OT Stop Time 1151   OT Time Calculation (min) 46 min   Activity Tolerance Patient tolerated treatment well   Behavior During Therapy [facility] for tasks assessed/performed      Past Medical History  Diagnosis Date  . HTN (hypertension)   . COPD (chronic obstructive pulmonary disease)   . GERD (gastroesophageal reflux disease)   . Glaucoma   . Anxiety   . Depression   . Anemia   . Arthritis   . Gout     Past Surgical History  Procedure Laterality Date  . Joint replacement Bilateral     hip   . Joint replacement Right     knee  . Shoulder arthroscopy Right   . Abdominal hysterectomy    . Foot surgery Left     repair of "kissing Cousins"  . Bunionectomy Bilateral   . Back surgery      lumbar-disc  . Shoulder hemi-arthroplasty Left 03/25/2014    Procedure: LEFT SHOULDER HEMI-ARTHROPLASTY;  Surgeon: Stanley E Harrison, MD;  Location: [facility];  Service: Orthopedics;  Laterality: Left;    There were no vitals filed for this visit.  Visit Diagnosis:  Decreased muscle strength  Stiffness of shoulder joint, left  Shoulder pain, left  Limited joint range of motion  Muscle weakness (generalized)      Subjective Assessment - 09/11/14 1107    Subjective  S: My right wrist was hurting after using those  weights last time.    Currently in Pain? No/denies           [facility] OT Assessment - 09/11/14 1107    Assessment   Diagnosis S/P Left Total Shoulder Replacement   Precautions   Precautions Shoulder   Type of Shoulder Precautions progress as tolerated   Observation/Other Assessments   Quick DASH  22.73  previous 27.27   Palpation   Palpation comment Min fascial restrictions in left upper arm & scapularis regions   AROM   Overall AROM Comments assessed in supine, ER/IR with shoulder adducted (seated AROM) (supine AROM from 06/10/14)   Left Shoulder Flexion 152 Degrees  previous 141   Left Shoulder ABduction 124 Degrees  previous 115   Left Shoulder Internal Rotation 90 Degrees  same as previous   Left Shoulder External Rotation 71 Degrees  previous 74   PROM   Overall PROM Comments WFL in supine   Strength   Overall Strength Comments assessed seated. IR/ER adducted   Strength Assessment Site Shoulder   Right/Left Shoulder Left   Left Shoulder Flexion 3+/5  same as previous    Left Shoulder ABduction 3/5  same as previous    Left Shoulder Internal Rotation 4/5  previous 4-/5   Left Shoulder External Rotation 4/5  previous 4-/5               Quick Dash - 09/11/14 1126    Open a tight or new jar Mild difficulty   Do heavy household chores (wash walls, wash floors) Mild difficulty   Carry a shopping bag or briefcase No difficulty   Wash your back Unable   Use a knife to cut food Mild difficulty   Recreational activities in which you take some force or impact through your arm, shoulder, or hand (golf, hammering, tennis) Moderate difficulty   During the past week, to what extent has your arm, shoulder or hand problem interfered with your normal social activities with family, friends, neighbors, or groups? Not at all   During the past week, to what extent has your arm, shoulder or hand problem limited your work or other regular daily activities Not at all   Arm, shoulder,  or hand pain. Mild   Tingling (pins and needles) in your arm, shoulder, or hand None   Difficulty Sleeping No difficulty   DASH Score 22.73 %             OT Treatments/Exercises (OP) - 09/11/14 1113    Exercises   Exercises Shoulder   Shoulder Exercises: Supine   Protraction PROM;5 reps;Strengthening;10 reps   Protraction Weight (lbs) 2   Horizontal ABduction PROM;5 reps;Strengthening;10 reps   Horizontal ABduction Weight (lbs) 2   External Rotation PROM;5 reps;Strengthening;10 reps   External Rotation Weight (lbs) 2   Internal Rotation PROM;5 reps;Strengthening;10 reps   Internal Rotation Weight (lbs) 2   Flexion PROM;5 reps;Strengthening;10 reps   Shoulder Flexion Weight (lbs) 2   ABduction PROM;5 reps;Strengthening;10 reps   Shoulder ABduction Weight (lbs) 2   Shoulder Exercises: Seated   Protraction Strengthening;12 reps   Protraction Weight (lbs) 1   Horizontal ABduction AROM;10 reps   External Rotation Strengthening;12 reps   External Rotation Weight (lbs) 1   Internal Rotation Strengthening;12 reps   Internal Rotation Weight (lbs) 1   Flexion Strengthening;12 reps   Flexion Weight (lbs) 1   Abduction Strengthening;12 reps   ABduction Weight (lbs) 1   Shoulder Exercises: ROM/Strengthening   Rebounder Level 2 3' forward 3' reverse   "W" Arms 10X   X to V Arms 10X  50% range   Proximal Shoulder Strengthening, Supine 10X with 2# weight, no rest break   Ball on Wall 1' flexion, unable to complete in abduction                 OT Short Term Goals - 09/11/14 1131    OT SHORT TERM GOAL #1   Title Patient will be educated on HEP.   OT SHORT TERM GOAL #2   Title Patient will have WFL PROM in her left shoulder in order to don and doff shirts and coats with increased ease.   OT SHORT TERM GOAL #3   Title Patient will improve left shoulder strength to 3+/5 for increased ability to lift bags of groceries.    Status Partially Met   OT SHORT TERM GOAL #4    Title Patient will decrease pain to 4/10 or better in left shoulder during functional activities.    OT SHORT TERM GOAL #5   Title Patient will decrease fascial restrictions to moderate in her left shoulder.           OT Long Term Goals - 09/11/14 1132    OT LONG TERM GOAL #1   Title Patient will return to prior level of independence with all functional activities and leisure activities.      Status On-going   OT LONG TERM GOAL #2   Title Patient will have WFL AROM in her left shoulder for increased ability to braid and wash her hair.   Status On-going   OT LONG TERM GOAL #3   Title Patient will improve left shoulder strength to 4+/5 for increased ability to lift pots an pans when cooking.    Status On-going   OT LONG TERM GOAL #4   Title Patient will decrease pain to 2/10 in left shoulder while completing functional activities.    Status Achieved   OT LONG TERM GOAL #5   Title Patient will decrease fascial restrictions in her left shoulder to minimal.               Plan - 09/11/14 1241    Clinical Impression Statement A: Reassessment completed this session. Pt has met 1 additional LTG, progressing towards remaining goals. Added ball on wall, pt able to complete in flexion, unable to complete in abduction. Pt reports it is getting easier to complete her BADL tasks using her left arm & she is able to reach items at shoulder level with less difficulty.    Plan P: Continue therapy for 4 more weeks, focusing on available AROM and increasing strength to use LUE during daily tasks. Discharge on next mini-reassessment. Educate pt on importance of completing HEP to enhance ROM and strength. Add towel stretch.         Problem List Patient Active Problem List   Diagnosis Date Noted  . HTN (hypertension) 04/17/2014  . Dry eyes 04/14/2014  . Bleeding from the nose 04/01/2014  . Renal insufficiency 04/01/2014  . Rotator cuff arthropathy   . Muscle weakness (generalized) 05/26/2013   . Pain in joint, shoulder region 05/26/2013  . Rotator cuff syndrome 05/22/2013    Leslie Troxler, OTR/L  [phone]  09/11/2014, 12:46 PM  [facility] [address] [place], [place], [address] Phone: [phone]   Fax:  [phone]     

## 2014-09-17 ENCOUNTER — Ambulatory Visit (HOSPITAL_COMMUNITY): Payer: Commercial Managed Care - HMO | Admitting: Occupational Therapy

## 2014-09-17 ENCOUNTER — Encounter (HOSPITAL_COMMUNITY): Payer: Self-pay | Admitting: Occupational Therapy

## 2014-09-17 DIAGNOSIS — M25612 Stiffness of left shoulder, not elsewhere classified: Secondary | ICD-10-CM | POA: Diagnosis not present

## 2014-09-17 DIAGNOSIS — M6289 Other specified disorders of muscle: Secondary | ICD-10-CM

## 2014-09-17 DIAGNOSIS — M629 Disorder of muscle, unspecified: Secondary | ICD-10-CM | POA: Diagnosis not present

## 2014-09-17 DIAGNOSIS — M25512 Pain in left shoulder: Secondary | ICD-10-CM

## 2014-09-17 DIAGNOSIS — M6281 Muscle weakness (generalized): Secondary | ICD-10-CM | POA: Diagnosis not present

## 2014-09-17 DIAGNOSIS — M2560 Stiffness of unspecified joint, not elsewhere classified: Secondary | ICD-10-CM

## 2014-09-17 NOTE — Therapy (Signed)
[place], [place], [address] Phone: [phone]   Fax:  [phone]  Occupational Therapy Treatment  Patient Details  Name: Heather [name] MRN: [id number] Date of Birth: 07-19-1940 Referring Provider:  Carole Civil, MD  Encounter Date: 09/17/2014      OT End of Session - 09/17/14 1430    Visit Number 22   Number of Visits 25   Date for OT Re-Evaluation 11/10/14  10/09/2014   Authorization Type [employer]   Authorization Time Period no authorization required update g code 27th visit   Authorization - Visit Number 21   Authorization - Number of Visits 27   OT Start Time 1346   OT Stop Time 1432   OT Time Calculation (min) 46 min   Activity Tolerance Patient tolerated treatment well   Behavior During Therapy [facility] for tasks assessed/performed      Past Medical History  Diagnosis Date  . HTN (hypertension)   . COPD (chronic obstructive pulmonary disease)   . GERD (gastroesophageal reflux disease)   . Glaucoma   . Anxiety   . Depression   . Anemia   . Arthritis   . Gout     Past Surgical History  Procedure Laterality Date  . Joint replacement Bilateral     hip   . Joint replacement Right     knee  . Shoulder arthroscopy Right   . Abdominal hysterectomy    . Foot surgery Left     repair of "kissing Cousins"  . Bunionectomy Bilateral   . Back surgery      lumbar-disc  . Shoulder hemi-arthroplasty Left 03/25/2014    Procedure: LEFT SHOULDER HEMI-ARTHROPLASTY;  Surgeon: Carole Civil, MD;  Location: [facility];  Service: Orthopedics;  Laterality: Left;    There were no vitals filed for this visit.  Visit Diagnosis:  Decreased muscle strength  Stiffness of shoulder joint, left  Shoulder pain, left  Limited joint range of motion  Muscle weakness (generalized)  Tight fascia      Subjective Assessment - 09/17/14 1349    Subjective  S: I'm feeling just a little bit of pain.    Currently in Pain? Yes   Pain Score 4    Pain Location Shoulder   Pain Orientation Left   Pain Descriptors / Indicators Aching   Pain Type Acute pain            [facility] OT Assessment - 09/17/14 1348    Assessment   Diagnosis S/P Left Total Shoulder Replacement   Precautions   Precautions Shoulder   Type of Shoulder Precautions progress as tolerated                  OT Treatments/Exercises (OP) - 09/17/14 1349    Exercises   Exercises Shoulder   Shoulder Exercises: Supine   Protraction PROM;5 reps;Strengthening;12 reps   Protraction Weight (lbs) 2   Horizontal ABduction PROM;5 reps;Strengthening;Weights;12 reps   Horizontal ABduction Weight (lbs) 2   External Rotation PROM;5 reps;Strengthening;12 reps   External Rotation Weight (lbs) 2   Internal Rotation PROM;5 reps;Strengthening;12 reps   Internal Rotation Weight (lbs) 2   Flexion PROM;5 reps;Strengthening;12 reps   Shoulder Flexion Weight (lbs) 2   ABduction PROM;5 reps;Strengthening;12 reps   Shoulder ABduction Weight (lbs) 2   Shoulder Exercises: Seated   Protraction Strengthening;12 reps   Protraction Weight (lbs) 1   Horizontal ABduction Strengthening;12 reps   Horizontal ABduction Weight (lbs) 1  External Rotation Strengthening;12 reps   External Rotation Weight (lbs) 1   Internal Rotation Strengthening;12 reps   Internal Rotation Weight (lbs) 1   Flexion Strengthening;12 reps   Flexion Weight (lbs) 1   Abduction Strengthening;12 reps   ABduction Weight (lbs) 1   Shoulder Exercises: ROM/Strengthening   Rebounder Level 2 3' forward 3' reverse   Over Head Lace 1'30"   Wall Pushups 10 reps   "W" Arms 10X   X to V Arms 10X  50% range   Proximal Shoulder Strengthening, Supine 10X with 2# weight, no rest break   Proximal Shoulder Strengthening, Seated 10X no rest break   Ball on Wall 1' flexion, unable to complete in abduction   Other ROM/Strengthening Exercises Cybex cable column: shoulder  extension/lower trapezius pull down 3# plate 15X   Manual Therapy   Manual Therapy Myofascial release;Muscle Energy Technique   Myofascial Release Myofascial release to left upper arm, trapezius, and scapularis region to decrease joint mobility and increase joint mobility in a pain free zone.   Muscle Energy Technique Muscle energy technique to left anterior deltoid to relax tone and muscle spasm and improve range of motion.                   OT Short Term Goals - 09/11/14 1131    OT SHORT TERM GOAL #1   Title Patient will be educated on HEP.   OT SHORT TERM GOAL #2   Title Patient will have WFL PROM in her left shoulder in order to don and doff shirts and coats with increased ease.   OT SHORT TERM GOAL #3   Title Patient will improve left shoulder strength to 3+/5 for increased ability to lift bags of groceries.    Status Partially Met   OT SHORT TERM GOAL #4   Title Patient will decrease pain to 4/10 or better in left shoulder during functional activities.    OT SHORT TERM GOAL #5   Title Patient will decrease fascial restrictions to moderate in her left shoulder.           OT Long Term Goals - 09/11/14 1132    OT LONG TERM GOAL #1   Title Patient will return to prior level of independence with all functional activities and leisure activities.    Status On-going   OT LONG TERM GOAL #2   Title Patient will have WFL AROM in her left shoulder for increased ability to braid and wash her hair.   Status On-going   OT LONG TERM GOAL #3   Title Patient will improve left shoulder strength to 4+/5 for increased ability to lift pots an pans when cooking.    Status On-going   OT LONG TERM GOAL #4   Title Patient will decrease pain to 2/10 in left shoulder while completing functional activities.    Status Achieved   OT LONG TERM GOAL #5   Title Patient will decrease fascial restrictions in her left shoulder to minimal.               Plan - 09/17/14 1609    Clinical  Impression Statement A: Continued shoulder strengthening exercises. Resumed cybex cable column. Focused on proper form during exercises and depressing shoulder during exercises. Pt tolerated treatment well.    Plan P: Add cybex row/press. Add towel stretch.         Problem List Patient Active Problem List   Diagnosis Date Noted  . HTN (hypertension) 04/17/2014  .  Dry eyes 04/14/2014  . Bleeding from the nose 04/01/2014  . Renal insufficiency 04/01/2014  . Rotator cuff arthropathy   . Muscle weakness (generalized) 05/26/2013  . Pain in joint, shoulder region 05/26/2013  . Rotator cuff syndrome 05/22/2013    Guadelupe Sabin, OTR/L  [phone]  09/17/2014, 4:13 PM  [place] [address] [place], [place], [address] Phone: [phone]   Fax:  [phone]

## 2014-09-24 ENCOUNTER — Encounter (HOSPITAL_COMMUNITY): Payer: Commercial Managed Care - HMO

## 2014-09-25 DIAGNOSIS — I10 Essential (primary) hypertension: Secondary | ICD-10-CM | POA: Diagnosis not present

## 2014-09-25 DIAGNOSIS — J449 Chronic obstructive pulmonary disease, unspecified: Secondary | ICD-10-CM | POA: Diagnosis not present

## 2014-10-01 ENCOUNTER — Ambulatory Visit (HOSPITAL_COMMUNITY): Payer: Commercial Managed Care - HMO | Admitting: Occupational Therapy

## 2014-10-01 ENCOUNTER — Encounter (HOSPITAL_COMMUNITY): Payer: Self-pay | Admitting: Occupational Therapy

## 2014-10-01 DIAGNOSIS — M25512 Pain in left shoulder: Secondary | ICD-10-CM | POA: Diagnosis not present

## 2014-10-01 DIAGNOSIS — M6281 Muscle weakness (generalized): Secondary | ICD-10-CM | POA: Diagnosis not present

## 2014-10-01 DIAGNOSIS — M6289 Other specified disorders of muscle: Secondary | ICD-10-CM

## 2014-10-01 DIAGNOSIS — M25612 Stiffness of left shoulder, not elsewhere classified: Secondary | ICD-10-CM | POA: Diagnosis not present

## 2014-10-01 DIAGNOSIS — M629 Disorder of muscle, unspecified: Secondary | ICD-10-CM | POA: Diagnosis not present

## 2014-10-01 DIAGNOSIS — M2560 Stiffness of unspecified joint, not elsewhere classified: Secondary | ICD-10-CM

## 2014-10-01 NOTE — Therapy (Signed)
Heather [name], [place], [address] Phone: [phone]   Fax:  [phone]  Occupational Therapy Treatment  Patient Details  Name: Heather [name] MRN: [id number] Date of Birth: [birth year] Referring Provider:  Carole Civil, MD  Encounter Date: 10/01/2014      OT End of Session - 10/01/14 1156    Visit Number 23   Number of Visits 25   Date for OT Re-Evaluation 11/10/14  10/09/2014   Authorization Type [employer]   Authorization Time Period no authorization required update g code 27th visit   Authorization - Visit Number 23   Authorization - Number of Visits 27   OT Start Time 1016   OT Stop Time 1105   OT Time Calculation (min) 49 min   Activity Tolerance Patient tolerated treatment well   Behavior During Therapy [facility] for tasks assessed/performed      Past Medical History  Diagnosis Date  . HTN (hypertension)   . COPD (chronic obstructive pulmonary disease)   . GERD (gastroesophageal reflux disease)   . Glaucoma   . Anxiety   . Depression   . Anemia   . Arthritis   . Gout     Past Surgical History  Procedure Laterality Date  . Joint replacement Bilateral     hip   . Joint replacement Right     knee  . Shoulder arthroscopy Right   . Abdominal hysterectomy    . Foot surgery Left     repair of "kissing Cousins"  . Bunionectomy Bilateral   . Back surgery      lumbar-disc  . Shoulder hemi-arthroplasty Left 03/25/2014    Procedure: LEFT SHOULDER HEMI-ARTHROPLASTY;  Surgeon: Heather Civil, MD;  Location: [facility];  Service: Orthopedics;  Laterality: Left;    There were no vitals filed for this visit.  Visit Diagnosis:  Decreased muscle strength  Stiffness of shoulder joint, left  Shoulder pain, left  Limited joint range of motion  Tight fascia      Subjective Assessment - 10/01/14 1017    Subjective  S: I took some pain medicine before I came just in case.    Currently in Pain?  No/denies            [facility] OT Assessment - 10/01/14 1017    Assessment   Diagnosis S/P Left Total Shoulder Replacement   Precautions   Precautions Shoulder   Type of Shoulder Precautions progress as tolerated                  OT Treatments/Exercises (OP) - 10/01/14 1019    Exercises   Exercises Shoulder   Shoulder Exercises: Supine   Protraction PROM;5 reps;Strengthening;12 reps   Protraction Weight (lbs) 2   Horizontal ABduction PROM;5 reps;Strengthening;Weights;12 reps   Horizontal ABduction Weight (lbs) 2   External Rotation PROM;5 reps;Strengthening;12 reps   External Rotation Weight (lbs) 2   Internal Rotation PROM;5 reps;Strengthening;12 reps   Internal Rotation Weight (lbs) 2   Flexion PROM;5 reps;Strengthening;12 reps   Shoulder Flexion Weight (lbs) 2   ABduction PROM;5 reps;Strengthening;12 reps   Shoulder ABduction Weight (lbs) 2   Shoulder Exercises: Seated   Protraction Strengthening;12 reps   Protraction Weight (lbs) 1   Horizontal ABduction Strengthening;12 reps   Horizontal ABduction Weight (lbs) 1   External Rotation Strengthening;12 reps   External Rotation Weight (lbs) 1   Internal Rotation Strengthening;12 reps   Internal Rotation Weight (lbs) 1   Flexion  Strengthening;12 reps   Flexion Weight (lbs) 1   Abduction Strengthening;12 reps   ABduction Weight (lbs) 1   Shoulder Exercises: ROM/Strengthening   Rebounder Level 2 3' forward 3' reverse   Over Head Lace 1' with 1# wrist weight   "W" Arms 10X   X to V Arms 10X  50% range   Proximal Shoulder Strengthening, Supine 12X with 2# weight, no rest break   Proximal Shoulder Strengthening, Seated 10X with 1# weight no rest break   Ball on Wall 1' flexion 1' abduction   Manual Therapy   Manual Therapy Myofascial release;Muscle Energy Technique   Myofascial Release Myofascial release to left upper arm, trapezius, and scapularis region to decrease joint mobility and increase joint mobility  in a pain free zone.   Muscle Energy Technique Muscle energy technique to left anterior deltoid to relax tone and muscle spasm and improve range of motion.                   OT Short Term Goals - 09/11/14 1131    OT SHORT TERM GOAL #1   Title Patient will be educated on HEP.   OT SHORT TERM GOAL #2   Title Patient will have WFL PROM in her left shoulder in order to don and doff shirts and coats with increased ease.   OT SHORT TERM GOAL #3   Title Patient will improve left shoulder strength to 3+/5 for increased ability to lift bags of groceries.    Status Partially Met   OT SHORT TERM GOAL #4   Title Patient will decrease pain to 4/10 or better in left shoulder during functional activities.    OT SHORT TERM GOAL #5   Title Patient will decrease fascial restrictions to moderate in her left shoulder.           OT Long Term Goals - 09/11/14 1132    OT LONG TERM GOAL #1   Title Patient will return to prior level of independence with all functional activities and leisure activities.    Status On-going   OT LONG TERM GOAL #2   Title Patient will have WFL AROM in her left shoulder for increased ability to braid and wash her hair.   Status On-going   OT LONG TERM GOAL #3   Title Patient will improve left shoulder strength to 4+/5 for increased ability to lift pots an pans when cooking.    Status On-going   OT LONG TERM GOAL #4   Title Patient will decrease pain to 2/10 in left shoulder while completing functional activities.    Status Achieved   OT LONG TERM GOAL #5   Title Patient will decrease fascial restrictions in her left shoulder to minimal.               Plan - 10/01/14 1157    Clinical Impression Statement A: Continued shoulder strengthening exercises within functional limits. Added towel stretch. Unable to add cybex row/press due to it being unavailable. Pt tolerated treatment well, focused on proper form during exercises.    Plan P: Add cybex row/press.          Problem List Patient Active Problem List   Diagnosis Date Noted  . HTN (hypertension) 04/17/2014  . Dry eyes 04/14/2014  . Bleeding from the nose 04/01/2014  . Renal insufficiency 04/01/2014  . Rotator cuff arthropathy   . Muscle weakness (generalized) 05/26/2013  . Pain in joint, shoulder region 05/26/2013  . Rotator cuff syndrome 05/22/2013  Guadelupe Sabin, OTR/L  [phone]  10/01/2014, 11:59 AM  [place], [place], [address] Phone: [phone]   Fax:  [phone]

## 2014-10-07 ENCOUNTER — Ambulatory Visit (HOSPITAL_COMMUNITY): Payer: Commercial Managed Care - HMO | Attending: Orthopedic Surgery | Admitting: Occupational Therapy

## 2014-10-07 DIAGNOSIS — M629 Disorder of muscle, unspecified: Secondary | ICD-10-CM | POA: Insufficient documentation

## 2014-10-07 DIAGNOSIS — M25512 Pain in left shoulder: Secondary | ICD-10-CM | POA: Insufficient documentation

## 2014-10-07 DIAGNOSIS — M25612 Stiffness of left shoulder, not elsewhere classified: Secondary | ICD-10-CM | POA: Insufficient documentation

## 2014-10-07 DIAGNOSIS — M6281 Muscle weakness (generalized): Secondary | ICD-10-CM | POA: Insufficient documentation

## 2014-10-07 DIAGNOSIS — M2560 Stiffness of unspecified joint, not elsewhere classified: Secondary | ICD-10-CM | POA: Insufficient documentation

## 2014-10-12 ENCOUNTER — Other Ambulatory Visit: Payer: Self-pay | Admitting: *Deleted

## 2014-10-12 ENCOUNTER — Ambulatory Visit (HOSPITAL_COMMUNITY): Payer: Commercial Managed Care - HMO | Admitting: Occupational Therapy

## 2014-10-12 ENCOUNTER — Telehealth: Payer: Self-pay | Admitting: Orthopedic Surgery

## 2014-10-12 ENCOUNTER — Encounter (HOSPITAL_COMMUNITY): Payer: Self-pay | Admitting: Occupational Therapy

## 2014-10-12 DIAGNOSIS — M25612 Stiffness of left shoulder, not elsewhere classified: Secondary | ICD-10-CM | POA: Diagnosis not present

## 2014-10-12 DIAGNOSIS — M6289 Other specified disorders of muscle: Secondary | ICD-10-CM

## 2014-10-12 DIAGNOSIS — M629 Disorder of muscle, unspecified: Secondary | ICD-10-CM | POA: Diagnosis not present

## 2014-10-12 DIAGNOSIS — M2560 Stiffness of unspecified joint, not elsewhere classified: Secondary | ICD-10-CM | POA: Diagnosis not present

## 2014-10-12 DIAGNOSIS — M6281 Muscle weakness (generalized): Secondary | ICD-10-CM | POA: Diagnosis not present

## 2014-10-12 DIAGNOSIS — M25512 Pain in left shoulder: Secondary | ICD-10-CM | POA: Diagnosis not present

## 2014-10-12 MED ORDER — ACETAMINOPHEN-CODEINE #3 300-30 MG PO TABS: 1.0000 | ORAL_TABLET | ORAL | Status: DC | PRN

## 2014-10-12 NOTE — Telephone Encounter (Signed)
Patient called, requests refill on medication: acetaminophen-codeine (TYLENOL #3) 300-30 MG per tablet [[id number]] - patient ph# [phone].

## 2014-10-12 NOTE — Telephone Encounter (Signed)
Faxed to pharmacy

## 2014-10-12 NOTE — Patient Instructions (Signed)
Strengthening: Chest Pull - Resisted   Hold Theraband in front of body with hands about shoulder width a part. Pull band a part and back together slowly. Repeat _10___ times. Complete _1___ set(s) per session.. Repeat __2__ session(s) per day.  http://orth.exer.us/926   Copyright  VHI. All rights reserved.   PNF Strengthening: Resisted   Standing with resistive band around each hand, bring right arm up and away, thumb back. Repeat _10___ times per set. Do __1__ sets per session. Do __2__ sessions per day.  http://orth.exer.us/918   Copyright  VHI. All rights reserved.   PNF Strengthening: Resisted   Standing with resistive band around each hand, bring right arm up and across body. Repeat __10__ times per set. Do __1__ sets per session. Do __2__ sessions per day.  http://orth.exer.us/920   Copyright  VHI. All rights reserved.    Resisted External Rotation: in Neutral - Bilateral   Sit or stand, tubing in both hands, elbows at sides, bent to 90, forearms forward. Pinch shoulder blades together and rotate forearms out. Keep elbows at sides. Repeat __10__ times per set. Do __1__ sets per session. Do _2___ sessions per day.  http://orth.exer.us/966   Copyright  VHI. All rights reserved.   PNF Strengthening: Resisted   Standing, hold resistive band above head. Bring right arm down and out from side. Repeat __10__ times per set. Do __1__ sets per session. Do __2__ sessions per day.  http://orth.exer.us/922   Copyright  VHI. All rights reserved.      Towel Stretch with Internal Rotation   Gently pull up your affected arm  behind your back with the assist of a towel

## 2014-10-12 NOTE — Therapy (Signed)
[place], [place], [address] Phone: [phone]   Fax:  [phone]  Occupational Therapy Reassessment & Treatment  Patient Details  Name: Heather [name] MRN: [id number] Date of Birth: October 06, 1940 Referring Provider:  Carole Civil, MD  Encounter Date: 10/12/2014      OT End of Session - 10/12/14 1535    Visit Number 24   Number of Visits 25   Date for OT Re-Evaluation 11/10/14   Authorization Type [employer]   Authorization Time Period no authorization required update g code 27th visit   Authorization - Visit Number 24   Authorization - Number of Visits 27   OT Start Time 1019   OT Stop Time 1058   OT Time Calculation (min) 39 min   Activity Tolerance Patient tolerated treatment well   Behavior During Therapy [facility] for tasks assessed/performed      Past Medical History  Diagnosis Date  . HTN (hypertension)   . COPD (chronic obstructive pulmonary disease)   . GERD (gastroesophageal reflux disease)   . Glaucoma   . Anxiety   . Depression   . Anemia   . Arthritis   . Gout     Past Surgical History  Procedure Laterality Date  . Joint replacement Bilateral     hip   . Joint replacement Right     knee  . Shoulder arthroscopy Right   . Abdominal hysterectomy    . Foot surgery Left     repair of "kissing Cousins"  . Bunionectomy Bilateral   . Back surgery      lumbar-disc  . Shoulder hemi-arthroplasty Left 03/25/2014    Procedure: LEFT SHOULDER HEMI-ARTHROPLASTY;  Surgeon: Carole Civil, MD;  Location: [facility];  Service: Orthopedics;  Laterality: Left;    There were no vitals filed for this visit.  Visit Diagnosis:  Decreased muscle strength  Stiffness of shoulder joint, left  Shoulder pain, left  Limited joint range of motion  Tight fascia  Muscle weakness (generalized)      Subjective Assessment - 10/12/14 1015    Subjective  S: I'm excited to be graduating today.    Currently in Pain? No/denies           [facility] OT Assessment - 10/12/14 1014    Assessment   Diagnosis S/P Left Total Shoulder Replacement   Precautions   Precautions Shoulder   Type of Shoulder Precautions progress as tolerated   Observation/Other Assessments   Quick DASH  18.18  22.73 previous   Palpation   Palpation comment Min fascial restrictions in left upper arm & scapularis regions   AROM   Overall AROM Comments assessed in supine, ER/IR with shoulder adducted (seated AROM)   Left Shoulder Flexion 161 Degrees  152 previous   Left Shoulder ABduction 140 Degrees  124 previous   Left Shoulder Internal Rotation 90 Degrees  same as previous   Left Shoulder External Rotation 84 Degrees  71 previous   PROM   Overall PROM Comments WFL in supine   Strength   Overall Strength Comments assessed seated. IR/ER adducted   Strength Assessment Site Shoulder   Right/Left Shoulder Left   Left Shoulder Flexion 3+/5  same as previous   Left Shoulder ABduction 3+/5  3/5 previous   Left Shoulder Internal Rotation 4/5  same as previous   Left Shoulder External Rotation 4/5  same as previous  Katina Dung - 10/12/14 1040    Open a tight or new jar No difficulty   Do heavy household chores (wash walls, wash floors) Mild difficulty   Carry a shopping bag or briefcase No difficulty   Wash your back Unable   Use a knife to cut food No difficulty   Recreational activities in which you take some force or impact through your arm, shoulder, or hand (golf, hammering, tennis) Mild difficulty   During the past week, to what extent has your arm, shoulder or hand problem interfered with your normal social activities with family, friends, neighbors, or groups? Not at all   During the past week, to what extent has your arm, shoulder or hand problem limited your work or other regular daily activities Not at all   Arm, shoulder, or hand pain. Moderate   Tingling (pins and needles) in  your arm, shoulder, or hand None   Difficulty Sleeping No difficulty   DASH Score 18.18 %             OT Treatments/Exercises (OP) - 10/12/14 1021    Exercises   Exercises Shoulder   Shoulder Exercises: Supine   Protraction PROM;5 reps   Horizontal ABduction PROM;5 reps   External Rotation PROM;5 reps   Internal Rotation PROM;5 reps   Flexion PROM;5 reps   ABduction PROM;5 reps   Manual Therapy   Manual Therapy Myofascial release;Muscle Energy Technique   Myofascial Release Myofascial release to left upper arm, trapezius, and scapularis region to decrease joint mobility and increase joint mobility in a pain free zone.   Muscle Energy Technique Muscle energy technique to left anterior deltoid to relax tone and muscle spasm and improve range of motion.                OT Education - 10/12/14 1255    Education provided Yes   Education Details theraband strengthening exercises, IR towel stretch   Person(s) Educated Patient   Methods Explanation;Demonstration;Handout   Comprehension Verbalized understanding;Returned demonstration          OT Short Term Goals - 10/12/14 1044    OT SHORT TERM GOAL #1   Title Patient will be educated on HEP.   OT SHORT TERM GOAL #2   Title Patient will have WFL PROM in her left shoulder in order to don and doff shirts and coats with increased ease.   OT SHORT TERM GOAL #3   Title Patient will improve left shoulder strength to 3+/5 for increased ability to lift bags of groceries.    Status Achieved   OT SHORT TERM GOAL #4   Title Patient will decrease pain to 4/10 or better in left shoulder during functional activities.    OT SHORT TERM GOAL #5   Title Patient will decrease fascial restrictions to moderate in her left shoulder.           OT Long Term Goals - 10/12/14 1044    OT LONG TERM GOAL #1   Title Patient will return to prior level of independence with all functional activities and leisure activities.    Status Partially  Met   OT LONG TERM GOAL #2   Title Patient will have WFL AROM in her left shoulder for increased ability to braid and wash her hair.   Status Achieved   OT LONG TERM GOAL #3   Title Patient will improve left shoulder strength to 4+/5 for increased ability to lift pots an pans when cooking.  Status Not Met   OT LONG TERM GOAL #4   Title Patient will decrease pain to 2/10 in left shoulder while completing functional activities.    Status Achieved   OT LONG TERM GOAL #5   Title Patient will decrease fascial restrictions in her left shoulder to minimal.               Plan - November 01, 2014 1536    Clinical Impression Statement A: Mini-reassessment completed this session. Pt has met all STGs, 2/4 LTGs, and partially met 1 additional LTG. Pt reports she is now able to reach up into overhead cabinets with greater ease and can carry heavier items without pain. Pt was provided with red theraband exercises and instructed on IR towel stretch for her HEP. Pt is agreeable to discharge today.    Plan P: Discharge pt.           G-Codes - 11-01-14 1539    Functional Assessment Tool Used DASH Score: 18.18% impairment   Functional Limitation Carrying, moving and handling objects   Carrying, Moving and Handling Objects Current Status [phone]) At least 1 percent but less than 20 percent impaired, limited or restricted   Carrying, Moving and Handling Objects Discharge Status [phone]) At least 1 percent but less than 20 percent impaired, limited or restricted      Problem List Patient Active Problem List   Diagnosis Date Noted  . HTN (hypertension) 04/17/2014  . Dry eyes 04/14/2014  . Bleeding from the nose 04/01/2014  . Renal insufficiency 04/01/2014  . Rotator cuff arthropathy   . Muscle weakness (generalized) 05/26/2013  . Pain in joint, shoulder region 05/26/2013  . Rotator cuff syndrome 05/22/2013    Guadelupe Sabin, OTR/L  [phone]  11-01-2014, 3:45 PM  [facility] [place], [place], [address] Phone: [phone]   Fax:  [phone]   OCCUPATIONAL THERAPY DISCHARGE SUMMARY  Visits from Start of Care: 24  Current functional level related to goals / functional outcomes: See above for objective measurements. Pt reports she is now able to complete her B/IADL tasks at her prior level of functioning and independence with the exception of reaching behind her back during bathing tasks. Pt report greater ease completing overhead reaching tasks.    Remaining deficits: Pt continues to report difficulty during tasks requiring her to reach around behind her back.    Education / Equipment: Provided pt with red theraband exercises to continue strengthening LUE, as well as instructions for IR towel stretch to increase function & independence in reaching behind her back.  Plan: Patient agrees to discharge.  Patient goals were met. Patient is being discharged due to being pleased with the current functional level.  ?????

## 2014-10-26 DIAGNOSIS — J449 Chronic obstructive pulmonary disease, unspecified: Secondary | ICD-10-CM | POA: Diagnosis not present

## 2014-10-26 DIAGNOSIS — I10 Essential (primary) hypertension: Secondary | ICD-10-CM | POA: Diagnosis not present

## 2014-11-05 DIAGNOSIS — I10 Essential (primary) hypertension: Secondary | ICD-10-CM | POA: Diagnosis not present

## 2014-11-05 DIAGNOSIS — H5200 Hypermetropia, unspecified eye: Secondary | ICD-10-CM | POA: Diagnosis not present

## 2014-11-05 DIAGNOSIS — H25819 Combined forms of age-related cataract, unspecified eye: Secondary | ICD-10-CM | POA: Diagnosis not present

## 2014-11-05 DIAGNOSIS — H5210 Myopia, unspecified eye: Secondary | ICD-10-CM | POA: Diagnosis not present

## 2014-11-26 DIAGNOSIS — J449 Chronic obstructive pulmonary disease, unspecified: Secondary | ICD-10-CM | POA: Diagnosis not present

## 2014-11-26 DIAGNOSIS — I10 Essential (primary) hypertension: Secondary | ICD-10-CM | POA: Diagnosis not present

## 2014-12-10 ENCOUNTER — Telehealth: Payer: Self-pay | Admitting: Orthopedic Surgery

## 2014-12-10 ENCOUNTER — Other Ambulatory Visit: Payer: Self-pay | Admitting: *Deleted

## 2014-12-10 MED ORDER — ACETAMINOPHEN-CODEINE #3 300-30 MG PO TABS: 1.0000 | ORAL_TABLET | ORAL | Status: DC | PRN

## 2014-12-10 NOTE — Telephone Encounter (Signed)
PATIENT PICKED UP RX 

## 2014-12-10 NOTE — Telephone Encounter (Signed)
Called patient; notified prescription is ready for pick-up.

## 2014-12-10 NOTE — Telephone Encounter (Signed)
Heather [name] is requesting a medication refill on acetaminophen-codeine (TYLENOL #3) 300-30 MG per tablet  Please advise?

## 2014-12-26 DIAGNOSIS — I10 Essential (primary) hypertension: Secondary | ICD-10-CM | POA: Diagnosis not present

## 2014-12-26 DIAGNOSIS — J449 Chronic obstructive pulmonary disease, unspecified: Secondary | ICD-10-CM | POA: Diagnosis not present

## 2015-02-01 DIAGNOSIS — J449 Chronic obstructive pulmonary disease, unspecified: Secondary | ICD-10-CM | POA: Diagnosis not present

## 2015-02-01 DIAGNOSIS — I10 Essential (primary) hypertension: Secondary | ICD-10-CM | POA: Diagnosis not present

## 2015-02-01 DIAGNOSIS — E785 Hyperlipidemia, unspecified: Secondary | ICD-10-CM | POA: Diagnosis not present

## 2015-02-01 DIAGNOSIS — R739 Hyperglycemia, unspecified: Secondary | ICD-10-CM | POA: Diagnosis not present

## 2015-02-01 DIAGNOSIS — R7303 Prediabetes: Secondary | ICD-10-CM | POA: Diagnosis not present

## 2015-02-01 DIAGNOSIS — Z23 Encounter for immunization: Secondary | ICD-10-CM | POA: Diagnosis not present

## 2015-02-01 DIAGNOSIS — F339 Major depressive disorder, recurrent, unspecified: Secondary | ICD-10-CM | POA: Diagnosis not present

## 2015-02-01 DIAGNOSIS — M1990 Unspecified osteoarthritis, unspecified site: Secondary | ICD-10-CM | POA: Diagnosis not present

## 2015-02-01 DIAGNOSIS — E669 Obesity, unspecified: Secondary | ICD-10-CM | POA: Diagnosis not present

## 2015-02-10 ENCOUNTER — Other Ambulatory Visit: Payer: Self-pay | Admitting: *Deleted

## 2015-02-10 ENCOUNTER — Telehealth: Payer: Self-pay | Admitting: Orthopedic Surgery

## 2015-02-10 MED ORDER — ACETAMINOPHEN-CODEINE #3 300-30 MG PO TABS: 1.0000 | ORAL_TABLET | ORAL | Status: DC | PRN

## 2015-02-10 NOTE — Telephone Encounter (Signed)
Patient called to request refill on acetaminophen-codeine (TYLENOL #3) 300-30 MG tablet [[id number]]  - her phone # is [phone]

## 2015-02-11 NOTE — Telephone Encounter (Signed)
Prescription faxed to [facility] patient, no answer, no option to leave vm

## 2015-02-16 ENCOUNTER — Other Ambulatory Visit (HOSPITAL_COMMUNITY): Payer: Self-pay | Admitting: Internal Medicine

## 2015-02-16 DIAGNOSIS — Z1231 Encounter for screening mammogram for malignant neoplasm of breast: Secondary | ICD-10-CM

## 2015-02-16 NOTE — Telephone Encounter (Signed)
Called patient and reached voice mail; left message to return call in regard to prescription information as noted.

## 2015-02-23 NOTE — Telephone Encounter (Signed)
Patient picked up prescription 02/22/15

## 2015-03-03 DIAGNOSIS — I10 Essential (primary) hypertension: Secondary | ICD-10-CM | POA: Diagnosis not present

## 2015-03-03 DIAGNOSIS — J449 Chronic obstructive pulmonary disease, unspecified: Secondary | ICD-10-CM | POA: Diagnosis not present

## 2015-03-04 ENCOUNTER — Ambulatory Visit (HOSPITAL_COMMUNITY)
Admission: RE | Admit: 2015-03-04 | Discharge: 2015-03-04 | Disposition: A | Discharge status: Home / Self Care | Payer: Commercial Managed Care - HMO | Source: Ambulatory Visit | Attending: Internal Medicine | Admitting: Internal Medicine

## 2015-03-04 DIAGNOSIS — Z1231 Encounter for screening mammogram for malignant neoplasm of breast: Secondary | ICD-10-CM | POA: Insufficient documentation

## 2015-03-10 DIAGNOSIS — J441 Chronic obstructive pulmonary disease with (acute) exacerbation: Secondary | ICD-10-CM | POA: Diagnosis not present

## 2015-03-23 ENCOUNTER — Encounter: Payer: Self-pay | Admitting: Orthopedic Surgery

## 2015-03-23 ENCOUNTER — Ambulatory Visit (INDEPENDENT_AMBULATORY_CARE_PROVIDER_SITE_OTHER): Payer: Commercial Managed Care - HMO

## 2015-03-23 ENCOUNTER — Ambulatory Visit (INDEPENDENT_AMBULATORY_CARE_PROVIDER_SITE_OTHER): Payer: Commercial Managed Care - HMO | Admitting: Orthopedic Surgery

## 2015-03-23 VITALS — BP 105/51 | Ht 63.0 in | Wt 190.0 lb

## 2015-03-23 DIAGNOSIS — Z96612 Presence of left artificial shoulder joint: Secondary | ICD-10-CM

## 2015-03-23 DIAGNOSIS — R252 Cramp and spasm: Secondary | ICD-10-CM | POA: Diagnosis not present

## 2015-03-23 DIAGNOSIS — M545 Low back pain, unspecified: Secondary | ICD-10-CM

## 2015-03-23 NOTE — Patient Instructions (Signed)
Continue heat therapy and start using ibuprofen as needed for back pain

## 2015-03-23 NOTE — Progress Notes (Signed)
Chief Complaint  Patient presents with  . Follow-up    follow up + xray left shoudler, DOS 03/25/14 hemi-arthroplasty    New complaint back pain new complaint cramping of the hands  Patient's left shoulder functioning well no pain good function  Hands cramp at intermittent times no pain no numbness or tingling no numbness noted  Back pain without leg symptoms or urologic symptoms or bowel symptoms  Past Medical History  Diagnosis Date  . HTN (hypertension)   . COPD (chronic obstructive pulmonary disease)   . GERD (gastroesophageal reflux disease)   . Glaucoma   . Anxiety   . Depression   . Anemia   . Arthritis   . Gout     BP 105/51 mmHg  Ht 5\' 3"  (1.6 m)  Wt 190 lb (86.183 kg)  BMI 33.67 kg/m2 She is well-groomed she is oriented 3 her mood is pleasant  Left shoulder active flexion 90 in each plane 45 external rotation no pain stable rotator cuff weakness is chronic skin normal incision clean neurovascular exam intact left upper extremity  Lumbar spine tender no deformity straight leg raise is negative  Hands normal. Bilaterally no tenderness no swelling no catching no locking good grip strength normal pinwheel test normal pulses normal skin  Unclear  #1 status post left hemiarthroplasty doing well  #2 back pain recommend ibuprofen and heat  #3 hand cramping unclear reexamine in 3 months

## 2015-04-10 DIAGNOSIS — I10 Essential (primary) hypertension: Secondary | ICD-10-CM | POA: Diagnosis not present

## 2015-04-10 DIAGNOSIS — E785 Hyperlipidemia, unspecified: Secondary | ICD-10-CM | POA: Diagnosis not present

## 2015-05-08 DIAGNOSIS — I10 Essential (primary) hypertension: Secondary | ICD-10-CM | POA: Diagnosis not present

## 2015-05-08 DIAGNOSIS — J449 Chronic obstructive pulmonary disease, unspecified: Secondary | ICD-10-CM | POA: Diagnosis not present

## 2015-06-04 ENCOUNTER — Encounter (HOSPITAL_COMMUNITY): Payer: Self-pay

## 2015-06-21 ENCOUNTER — Ambulatory Visit (INDEPENDENT_AMBULATORY_CARE_PROVIDER_SITE_OTHER): Payer: Commercial Managed Care - HMO | Admitting: Orthopedic Surgery

## 2015-06-21 ENCOUNTER — Ambulatory Visit (INDEPENDENT_AMBULATORY_CARE_PROVIDER_SITE_OTHER): Payer: Commercial Managed Care - HMO

## 2015-06-21 VITALS — BP 138/60 | HR 67 | Ht 63.0 in | Wt 190.0 lb

## 2015-06-21 DIAGNOSIS — Z96612 Presence of left artificial shoulder joint: Secondary | ICD-10-CM | POA: Diagnosis not present

## 2015-06-21 DIAGNOSIS — R252 Cramp and spasm: Secondary | ICD-10-CM | POA: Diagnosis not present

## 2015-06-21 DIAGNOSIS — M545 Low back pain, unspecified: Secondary | ICD-10-CM

## 2015-06-21 MED ORDER — ACETAMINOPHEN-CODEINE #3 300-30 MG PO TABS: 1.0000 | ORAL_TABLET | Freq: Three times a day (TID) | ORAL | Status: DC | PRN

## 2015-06-21 NOTE — Progress Notes (Addendum)
Chief Complaint  Patient presents with  . Follow-up    Bilateral hands   HPI-75 year old comes back in with 3 complaints she's had cramping in both hands we suggest a 3 month follow-up she's also had midline back pain occasional radiation into right or left leg. She's had 2 hip replacements and knee replacement and a hemiarthroplasty of her shoulder.  She tried ibuprofen and it upset her stomach she had to stop taking it so she's been alternating with Tylenol 3 and Tylenol  The shoulder remains stable  Review of Systems  Constitutional: Negative for fever, chills, weight loss and malaise/fatigue.  Gastrointestinal: Negative for diarrhea and constipation.  Genitourinary: Negative for urgency and frequency.  Neurological: Negative for tingling and focal weakness.    Past Medical History  Diagnosis Date  . HTN (hypertension)   . COPD (chronic obstructive pulmonary disease) (HCC)   . GERD (gastroesophageal reflux disease)   . Glaucoma   . Anxiety   . Depression   . Anemia   . Arthritis   . Gout     Past Surgical History  Procedure Laterality Date  . Joint replacement Bilateral     hip   . Joint replacement Right     knee  . Shoulder arthroscopy Right   . Abdominal hysterectomy    . Foot surgery Left     repair of "kissing Cousins"  . Bunionectomy Bilateral   . Back surgery      lumbar-disc  . Shoulder hemi-arthroplasty Left 03/25/2014    Procedure: LEFT SHOULDER HEMI-ARTHROPLASTY;  Surgeon: Vickki Hearing, MD;  Location: [facility];  Service: Orthopedics;  Laterality: Left;   No family history on file. Social History  Substance Use Topics  . Smoking status: Former Smoker -- 1.00 packs/day for 40 years    Types: Cigarettes    Quit date: 03/20/1985  . Smokeless tobacco: Not on file  . Alcohol Use: No    Current outpatient prescriptions:  .  acetaminophen-codeine (TYLENOL #3) 300-30 MG tablet, Take 1 tablet by mouth every 4 (four) hours as needed for moderate pain.,  Disp: 180 tablet, Rfl: 0 .  aspirin EC 81 MG tablet, Take 81 mg by mouth every morning., Disp: , Rfl:  .  Calcium-Magnesium-Zinc 333-133-5 MG TABS, Take 1 capsule by mouth daily., Disp: , Rfl:  .  cetirizine (ZYRTEC) 10 MG tablet, Take 10 mg by mouth every morning., Disp: , Rfl:  .  cholecalciferol (VITAMIN D) 1000 UNITS tablet, Take 1,000 Units by mouth at bedtime., Disp: , Rfl:  .  citalopram (CELEXA) 20 MG tablet, Take 20 mg by mouth every morning., Disp: , Rfl:  .  Fluticasone-Salmeterol (ADVAIR) 250-50 MCG/DOSE AEPB, Inhale 1 puff into the lungs 2 (two) times daily., Disp: , Rfl:  .  furosemide (LASIX) 40 MG tablet, Take 20 mg by mouth daily. , Disp: , Rfl:  .  hydrALAZINE (APRESOLINE) 25 MG tablet, Take 1 tablet by mouth 2 (two) times daily., Disp: , Rfl:  .  metoprolol (LOPRESSOR) 100 MG tablet, Take 1 tablet by mouth daily., Disp: , Rfl:  .  Multiple Vitamins-Minerals (SENTRY) TABS, Take 1 tablet by mouth every morning., Disp: , Rfl:  .  nitroGLYCERIN (NITROSTAT) 0.4 MG SL tablet, Place 0.4 mg under the tongue every 5 (five) minutes as needed for chest pain., Disp: , Rfl:  .  Omega-3 Fatty Acids (FISH OIL) 1000 MG CAPS, Take 1 capsule by mouth at bedtime., Disp: , Rfl:  .  omeprazole (PRILOSEC) 20 MG  capsule, Take 20 mg by mouth daily., Disp: , Rfl:  .  potassium chloride (MICRO-K) 10 MEQ CR capsule, Take 1 capsule by mouth 2 (two) times daily., Disp: , Rfl:  .  PROAIR HFA 108 (90 BASE) MCG/ACT inhaler, Inhale 2 puffs into the lungs every 6 (six) hours as needed for wheezing or shortness of breath. , Disp: , Rfl:  .  zolpidem (AMBIEN) 10 MG tablet, Take 10 mg by mouth at bedtime as needed for sleep., Disp: , Rfl:   BP 138/60 mmHg  Pulse 67  Ht  (1.6 m)  Wt 190 lb (86.183 kg)  BMI 33.67 kg/m2  Physical Exam  Constitutional: She is oriented to person, place, and time. She appears well-developed and well-nourished. No distress.  Cardiovascular: Normal rate and intact distal  pulses.   Neurological: She is alert and oriented to person, place, and time. She has normal reflexes. She exhibits normal muscle tone. Coordination normal.  Skin: Skin is warm and dry. No rash noted. She is not diaphoretic. No erythema. No pallor.  Psychiatric: She has a normal mood and affect. Her behavior is normal. Judgment and thought content normal.   normal body frame  Ortho Exam Tenderness in the midline of the lumbar spine with decreased range of motion without increased muscle tension no skin lesions or congenital abnormalities  Lower extremities show normal strength in both hips and knees and feet. No numbness detected either side mild peripheral edema extremities warm to touch skin normal hips knee stable.  The hand show no atrophy good grip strength normal abduction strength no numbness tingling.  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

## 2015-06-21 NOTE — Patient Instructions (Signed)
Start PT 

## 2015-07-07 ENCOUNTER — Encounter (HOSPITAL_COMMUNITY): Payer: Self-pay | Admitting: Physical Therapy

## 2015-07-07 ENCOUNTER — Ambulatory Visit (HOSPITAL_COMMUNITY): Payer: Commercial Managed Care - HMO | Attending: Orthopedic Surgery | Admitting: Physical Therapy

## 2015-07-07 DIAGNOSIS — M6281 Muscle weakness (generalized): Secondary | ICD-10-CM | POA: Insufficient documentation

## 2015-07-07 DIAGNOSIS — R2689 Other abnormalities of gait and mobility: Secondary | ICD-10-CM | POA: Insufficient documentation

## 2015-07-07 DIAGNOSIS — M545 Low back pain, unspecified: Secondary | ICD-10-CM

## 2015-07-07 DIAGNOSIS — R29898 Other symptoms and signs involving the musculoskeletal system: Secondary | ICD-10-CM | POA: Insufficient documentation

## 2015-07-07 NOTE — Patient Instructions (Signed)
BRIDGING  While lying on your back, tighten your lower abdominals, squeeze your buttocks and then raise your buttocks off the floor/bed as creating a "Bridge" with your body. Hold and then lower yourself and repeat.  x10     PIRIFORMIS STRETCH  While lying on your back with both knee bent, cross your affected leg on the other knee.   Next, hold your unaffected thigh and pull it up towards your chest until a stretch is felt in the buttock.  hold 30 sec, repeat 3x

## 2015-07-08 NOTE — Therapy (Signed)
[facility] [address] [place], [place], [address] Phone: [phone]   Fax:  [phone]  Physical Therapy Evaluation  Patient Details  Name: Heather [name] MRN: [id number] Date of Birth: 1940/11/30 Referring Provider: Fuller Canada, MD  Encounter Date: 07/07/2015      PT End of Session - 07/07/15 2125    Visit Number 1   Number of Visits 12   Date for PT Re-Evaluation 07/28/15   Authorization Type [employer]   Authorization Time Period 07/07/15 to 08/18/15   Authorization - Visit Number 1   Authorization - Number of Visits 12   PT Start Time 1430   PT Stop Time 1515   PT Time Calculation (min) 45 min   Activity Tolerance Patient tolerated treatment well   Behavior During Therapy [facility] for tasks assessed/performed      Past Medical History  Diagnosis Date  . HTN (hypertension)   . COPD (chronic obstructive pulmonary disease) (HCC)   . GERD (gastroesophageal reflux disease)   . Glaucoma   . Anxiety   . Depression   . Anemia   . Arthritis   . Gout     Past Surgical History  Procedure Laterality Date  . Joint replacement Bilateral     hip   . Joint replacement Right     knee  . Shoulder arthroscopy Right   . Abdominal hysterectomy    . Foot surgery Left     repair of "kissing Cousins"  . Bunionectomy Bilateral   . Back surgery      lumbar-disc  . Shoulder hemi-arthroplasty Left 03/25/2014    Procedure: LEFT SHOULDER HEMI-ARTHROPLASTY;  Surgeon: Vickki Hearing, MD;  Location: [facility];  Service: Orthopedics;  Laterality: Left;    There were no vitals filed for this visit.       Subjective Assessment - 07/07/15 1441    Subjective Pt states she noticed low back pain 6-8 months ago that began out of no where. States she couldn't take the pain any longer and went to Dr. Romeo Apple. He did an xray which found some arhtitis and sent her to PT for management of pain. She feels that some days are worse than others,  but more bad than good. Pain is aggravated with vaccuming, bending over and sitting. Pain is alleviated with medication, heat. Pain is described as grabbing. and central in her low back.    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 45 minutes   How long can you stand comfortably? 45 minutes   How long can you walk comfortably? 20 minutes   Diagnostic tests Xray: arthritis L5/S1   Patient Stated Goals get rid of the pain   Currently in Pain? No/denies  9/10 max             [facility] PT Assessment - 07/08/15 0001    Assessment   Medical Diagnosis Lumbar spine pain   Referring Provider Fuller Canada, MD   Onset Date/Surgical Date --  6-59mo   Next MD Visit Agust 2017   Prior Therapy 2012 low back   Precautions   Precautions None   Restrictions   Weight Bearing Restrictions No   Balance Screen   Has the patient fallen in the past 6 months No   Has the patient had a decrease in  activity level because of a fear of falling?  No   Is the patient reluctant to leave their home because of a fear of falling?  No   Home Environment   Living Environment Private residence   Living Arrangements Alone   Type of Home Apartment   Home Access Level entry   Prior Function   Level of Independence Independent   Vocation Retired   Leisure reading, gardening, baking, traveling   Observation/Other Assessments   Observations standing: ASIS, illiac crest appear equal   Sensation   Light Touch Appears Intact   Posture/Postural Control   Posture/Postural Control Postural limitations   Postural Limitations Rounded Shoulders;Forward head   AROM   Lumbar Flexion 2 inches from toes, no pain   Lumbar Extension limited, pain free   Lumbar - Right Side Bend past knee jt, pain on R coming up   Lumbar - Left Side Bend past knee, pain  on L coming up    Lumbar - Right Rotation 50% limited   Lumbar - Left Rotation 50% limited   Strength   Right Hip Flexion 3+/5   Right Hip Extension 2+/5   Right Hip ABduction 3/5   Left Hip Flexion 3+/5   Left Hip Extension 2+/5   Left Hip ABduction 3/5   Right Knee Flexion 5/5   Right Knee Extension 5/5   Left Knee Flexion 5/5   Left Knee Extension 5/5   Right Ankle Dorsiflexion 5/5   Left Ankle Dorsiflexion 5/5   Flexibility   Soft Tissue Assessment /Muscle Length yes   Quadriceps (+) L>R; (+) hip flexor   Piriformis 50% limited  BLE   Palpation   Palpation comment TTP along SIJ, piriformis (L>R), lumbar paraspinals   Special Tests    Special Tests Lumbar;Sacrolliac Tests   Lumbar Tests Slump Test;Prone Knee Bend Test;Straight Leg Raise   Slump test   Findings Negative   Prone Knee Bend Test   Findings Negative   Straight Leg Raise   Findings Negative   Bed Mobility   Bed Mobility --  poor technique with sit to/from supine using sit up form   Transfers   Five time sit to stand comments  21  UE on thighs, taking 3 attempts for initial stand   6 minute walk test results    Endurance additional comments 3 MWT: 452 ft                           PT Education - 07/07/15 2124    Education provided Yes   Education Details reviewed eval findings and POC; initial HEP   Person(s) Educated Patient   Methods Explanation;Handout   Comprehension Verbalized understanding          PT Short Term Goals - 07/08/15 1727    PT SHORT TERM GOAL #1   Title HEP   Time 3   Period Weeks   Status New   PT SHORT TERM GOAL #2   Title decreased pain to 5/10   Time 3   Period Weeks   Status New   PT SHORT TERM GOAL #3   Title lumbar ROM   Time 3   Period Weeks   Status New   PT SHORT TERM GOAL #4   Title correct log roll technique   Time 3   Period Weeks   Status New           PT Long Term Goals - 07/08/15  1728    PT LONG TERM GOAL #1   Title Pt  will demo improved BLE strength to atleast 4/5 MMT to improve her safety with functional mobility   Time 6   Period Weeks   Status New   PT LONG TERM GOAL #2   Title pt will demo safe lifting mechanics evident by her ability to lift 10# object off the floor x5 trials without cuing from therapist.   Time 6   Period Weeks   Status New   PT LONG TERM GOAL #3   Title Pt will demo decrease in pain report to no greater than 3/10 to improve her tolerance to ADLs and activity.   Time 6   Period Weeks   Status New   PT LONG TERM GOAL #4   Title Pt will report improvement in back pain evident by her tolerance to household chores such as vacuuming, etc.    Time 6   Period Weeks   Status New   PT LONG TERM GOAL #5   Title Pt will demo improved functional strength evident by her ability to perform 5x sit to stand in less than 14 sec.    Time 6   Period Weeks   Status New               Plan - 18-Jul-2015 07/23/24    Clinical Impression Statement Pt is a 74yo F presenting to OPPT with insidious onset of LBP ~6-79months ago. She currently demonstrates impairments in trunk/LE strength, flexibility, endurance and pain limiting her ability to tolerate ADLs and leisure activity. She also presents with limited performance during functional testing including 5x sit to stand and . She demonstrates poor postural awareness and mechanics during transitioning sit to/from supine as well as lifting objects from the floor. She would benefit from skilled PT to address her limitations and improve her tolerance to activity. Therapist discussed eval findings with pt and she is in agreement with proposed POC and PT frequency.    Rehab Potential Good   PT Frequency 2x / week   PT Duration 6 weeks   PT Treatment/Interventions ADLs/Self Care Home Management;Cryotherapy;Moist Heat;Electrical Stimulation;Therapeutic activities;Functional mobility training;Stair training;Gait training;Therapeutic exercise;Balance  training;Neuromuscular re-education;Patient/family education;Manual techniques;Passive range of motion   PT Next Visit Plan teach log roll, LE strength (hip extension/abd), hip flexibility, abdominal bracing. progress LE/trunk strength as tolerated   PT Home Exercise Plan initiated with bridge and piriformis stretch   Recommended Other Services none   Consulted and Agree with Plan of Care Patient      Patient will benefit from skilled therapeutic intervention in order to improve the following deficits and impairments:  Decreased activity tolerance, Decreased endurance, Decreased safety awareness, Decreased range of motion, Decreased mobility, Decreased strength, Difficulty walking, Hypomobility, Impaired flexibility, Postural dysfunction, Pain, Improper body mechanics, Increased muscle spasms  Visit Diagnosis: Midline low back pain without sciatica - Plan: PT plan of care cert/re-cert  Muscle weakness (generalized) - Plan: PT plan of care cert/re-cert  Other symptoms and signs involving the musculoskeletal system - Plan: PT plan of care cert/re-cert  Other abnormalities of gait and mobility - Plan: PT plan of care cert/re-cert      G-Codes - 2015-07-18 2131/07/24    Functional Assessment Tool Used FOTO: 50% limited   Functional Limitation Mobility: Walking and moving around   Mobility: Walking and Moving Around Current Status ([id number]) At least 40 percent but less than 60 percent impaired, limited or restricted   Mobility: Walking and  Moving Around Goal Status [phone]) At least 20 percent but less than 40 percent impaired, limited or restricted       Problem List Patient Active Problem List   Diagnosis Date Noted  . HTN (hypertension) 04/17/2014  . Dry eyes 04/14/2014  . Bleeding from the nose 04/01/2014  . Renal insufficiency 04/01/2014  . Rotator cuff arthropathy   . Muscle weakness (generalized) 05/26/2013  . Pain in joint, shoulder region 05/26/2013  . Rotator cuff syndrome  05/22/2013    10:14 PM,07/08/2015 Marylyn Ishihara PT, DPT [facility] [phone]  [facility] [address] [place], [place], [address] Phone: [phone]   Fax:  [phone]  Name: Heather [name] MRN: [id number] Date of Birth: 31-Oct-1940

## 2015-07-16 ENCOUNTER — Ambulatory Visit (HOSPITAL_COMMUNITY): Payer: Commercial Managed Care - HMO | Admitting: Physical Therapy

## 2015-07-16 DIAGNOSIS — R29898 Other symptoms and signs involving the musculoskeletal system: Secondary | ICD-10-CM | POA: Diagnosis not present

## 2015-07-16 DIAGNOSIS — M545 Low back pain, unspecified: Secondary | ICD-10-CM

## 2015-07-16 DIAGNOSIS — R2689 Other abnormalities of gait and mobility: Secondary | ICD-10-CM | POA: Diagnosis not present

## 2015-07-16 DIAGNOSIS — M6281 Muscle weakness (generalized): Secondary | ICD-10-CM

## 2015-07-16 NOTE — Therapy (Signed)
[facility] [address] [place], [place], [address] Phone: [phone]   Fax:  [phone]  Physical Therapy Treatment  Patient Details  Name: Heather [name] MRN: [id number] Date of Birth: Oct 11, 1940 Referring Provider: Fuller Canada, MD  Encounter Date: 07/16/2015      PT End of Session - 07/16/15 1158    Visit Number 2   Number of Visits 12   Date for PT Re-Evaluation 07/28/15   Authorization Type [employer]   Authorization Time Period 07/07/15 to 08/18/15   Authorization - Visit Number 2   Authorization - Number of Visits 12   PT Start Time 1115   PT Stop Time 1157   PT Time Calculation (min) 42 min   Activity Tolerance Patient tolerated treatment well   Behavior During Therapy [facility] for tasks assessed/performed      Past Medical History  Diagnosis Date  . HTN (hypertension)   . COPD (chronic obstructive pulmonary disease) (HCC)   . GERD (gastroesophageal reflux disease)   . Glaucoma   . Anxiety   . Depression   . Anemia   . Arthritis   . Gout     Past Surgical History  Procedure Laterality Date  . Joint replacement Bilateral     hip   . Joint replacement Right     knee  . Shoulder arthroscopy Right   . Abdominal hysterectomy    . Foot surgery Left     repair of "kissing Cousins"  . Bunionectomy Bilateral   . Back surgery      lumbar-disc  . Shoulder hemi-arthroplasty Left 03/25/2014    Procedure: LEFT SHOULDER HEMI-ARTHROPLASTY;  Surgeon: Vickki Hearing, MD;  Location: [facility];  Service: Orthopedics;  Laterality: Left;    There were no vitals filed for this visit.      Subjective Assessment - 07/16/15 1117    Subjective Pt states she is doing good. Feels her back is feeling a little better, however she rates it 8/10. She has been doing her HEP regularly at home without too much difficulty.    Pertinent History HTN, gerd, COPD, depression, B THA( 90s/2002), R TKA, R shoulder replacement   Patient  Stated Goals get rid of the pain   Currently in Pain? Yes   Pain Score 8    Pain Location Back   Pain Orientation Mid;Lower   Pain Descriptors / Indicators Aching   Pain Type Chronic pain   Pain Radiating Towards None   Pain Onset More than a month ago   Pain Frequency Constant   Aggravating Factors  vacuuming, bending over.   Pain Relieving Factors heat and tylenol   Effect of Pain on Daily Activities ADLs   Multiple Pain Sites No                         [facility] Adult PT Treatment/Exercise - 07/16/15 0001    Exercises   Exercises Lumbar;Other Exercises   Lumbar Exercises: Supine   Ab Set 5 seconds;15 reps   Other Supine Lumbar Exercises bent knee lower trunk rotation x20   Lumbar Exercises: Sidelying   Clam 10 reps each side   Manual Therapy   Manual Therapy Joint mobilization;Soft tissue mobilization   Manual therapy comments Performed separately from all other interventions   Joint Mobilization Grade I-III PA jt mobs to lumbar spine and sacrum   Soft tissue mobilization B piriformis TrP release  PT Education - 07/16/15 1157    Education provided Yes   Education Details reviewed/updated HEP; discussed implications of manual treatment for improved stiffness/pain   Person(s) Educated Patient   Methods Explanation;Demonstration;Handout   Comprehension Verbalized understanding;Returned demonstration          PT Short Term Goals - 07/16/15 1132    PT SHORT TERM GOAL #1   Title Pt will demo consistenct and independence with updated HEP   Time 2   Period Weeks   Status New   PT SHORT TERM GOAL #2   Title Pt will report decrased max pain report <5/10 to improve her tolerance with daily activity.   Time 3   Period Weeks   Status New   PT SHORT TERM GOAL #3   Title Pt will demo improved lumbar AROM to WNL to imrpove her ability to to perform house chores.    Time 3   Period Weeks   Status New   PT SHORT TERM GOAL #4   Title Pt  will demo full understanding of log roll technique with correct performance during her session without cues from therapist.   Time 2   Period Weeks   Status Achieved           PT Long Term Goals - 07/08/15 1728    PT LONG TERM GOAL #1   Title Pt will demo improved BLE strength to atleast 4/5 MMT to improve her safety with functional mobility   Time 6   Period Weeks   Status New   PT LONG TERM GOAL #2   Title pt will demo safe lifting mechanics evident by her ability to lift 10# object off the floor x5 trials without cuing from therapist.   Time 6   Period Weeks   Status New   PT LONG TERM GOAL #3   Title Pt will demo decrease in pain report to no greater than 3/10 to improve her tolerance to ADLs and activity.   Time 6   Period Weeks   Status New   PT LONG TERM GOAL #4   Title Pt will report improvement in back pain evident by her tolerance to household chores such as vacuuming, etc.    Time 6   Period Weeks   Status New   PT LONG TERM GOAL #5   Title Pt will demo improved functional strength evident by her ability to perform 5x sit to stand in less than 14 sec.    Time 6   Period Weeks   Status New               Plan - 07/16/15 1158    Clinical Impression Statement Pt arrived today having performed her HEP consistently at home since her initial eval. Pt reporting she is feeling a little better, however pain reported to be 8/10 in her low back. Today's session focused on therex to address LE and trunk weakness with her ability to correctly activate transverse abdominis with minimal cues from therapist. Ended today's session with gentle mobs to low lumbar spine and sacrum with pt reporting some improvements in pain at the end of the session. Will continue with current POC.   Rehab Potential Good   PT Frequency 2x / week   PT Duration 6 weeks   PT Treatment/Interventions ADLs/Self Care Home Management;Cryotherapy;Moist Heat;Electrical Stimulation;Therapeutic  activities;Functional mobility training;Stair training;Gait training;Therapeutic exercise;Balance training;Neuromuscular re-education;Patient/family education;Manual techniques;Passive range of motion   PT Next Visit Plan teach log roll, LE strength (hip  extension/abd), hip flexibility, abdominal bracing. progress LE/trunk strength as tolerated   PT Home Exercise Plan updated 07/16/15: bridge, piriformis stretch, clamshells, ab set, ab set with bent knee fall out.   Consulted and Agree with Plan of Care Patient      Patient will benefit from skilled therapeutic intervention in order to improve the following deficits and impairments:  Decreased activity tolerance, Decreased endurance, Decreased safety awareness, Decreased range of motion, Decreased mobility, Decreased strength, Difficulty walking, Hypomobility, Impaired flexibility, Postural dysfunction, Pain, Improper body mechanics, Increased muscle spasms  Visit Diagnosis: Midline low back pain without sciatica  Muscle weakness (generalized)  Other symptoms and signs involving the musculoskeletal system  Other abnormalities of gait and mobility     Problem List Patient Active Problem List   Diagnosis Date Noted  . HTN (hypertension) 04/17/2014  . Dry eyes 04/14/2014  . Bleeding from the nose 04/01/2014  . Renal insufficiency 04/01/2014  . Rotator cuff arthropathy   . Muscle weakness (generalized) 05/26/2013  . Pain in joint, shoulder region 05/26/2013  . Rotator cuff syndrome 05/22/2013   12:11 PM,07/16/2015 Marylyn Ishihara PT, DPT [facility] [phone]  [facility] [address] [place], [place], [address] Phone: [phone]   Fax:  [phone]  Name: Heather [name] MRN: [id number] Date of Birth: [birth year]

## 2015-07-21 ENCOUNTER — Ambulatory Visit (HOSPITAL_COMMUNITY): Payer: Commercial Managed Care - HMO

## 2015-07-21 DIAGNOSIS — M545 Low back pain, unspecified: Secondary | ICD-10-CM

## 2015-07-21 DIAGNOSIS — R29898 Other symptoms and signs involving the musculoskeletal system: Secondary | ICD-10-CM

## 2015-07-21 DIAGNOSIS — M6281 Muscle weakness (generalized): Secondary | ICD-10-CM | POA: Diagnosis not present

## 2015-07-21 DIAGNOSIS — R2689 Other abnormalities of gait and mobility: Secondary | ICD-10-CM

## 2015-07-21 NOTE — Therapy (Signed)
[facility] [address] [place], [place], [address] Phone: [phone]   Fax:  [phone]  Physical Therapy Treatment  Patient Details  Name: Heather [name] MRN: [id number] Date of Birth: Nov 16, 1940 Referring Provider: Fuller Canada, MD  Encounter Date: 07/21/2015      PT End of Session - 07/21/15 1002    Visit Number 3   Number of Visits 12   Date for PT Re-Evaluation 07/28/15   Authorization Type [employer]   Authorization Time Period 07/07/15 to 08/18/15   Authorization - Visit Number 3   Authorization - Number of Visits 12   PT Start Time 0950   PT Stop Time 1035   PT Time Calculation (min) 45 min   Activity Tolerance Patient tolerated treatment well   Behavior During Therapy [facility] for tasks assessed/performed      Past Medical History  Diagnosis Date  . HTN (hypertension)   . COPD (chronic obstructive pulmonary disease) (HCC)   . GERD (gastroesophageal reflux disease)   . Glaucoma   . Anxiety   . Depression   . Anemia   . Arthritis   . Gout     Past Surgical History  Procedure Laterality Date  . Joint replacement Bilateral     hip   . Joint replacement Right     knee  . Shoulder arthroscopy Right   . Abdominal hysterectomy    . Foot surgery Left     repair of "kissing Cousins"  . Bunionectomy Bilateral   . Back surgery      lumbar-disc  . Shoulder hemi-arthroplasty Left 03/25/2014    Procedure: LEFT SHOULDER HEMI-ARTHROPLASTY;  Surgeon: Vickki Hearing, MD;  Location: [facility];  Service: Orthopedics;  Laterality: Left;    There were no vitals filed for this visit.      Subjective Assessment - 07/21/15 0946    Subjective Pt stated she continues to have high pain scale on lower back, she is concerned with [facility] HEP exercise per past hx of hip replacement.  States low back feels nagging or pulled today.   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 Yes   Pain Score 7    Pain Location Back   Pain Orientation Lower   Pain Descriptors / Indicators Nagging  Pulling   Pain Type Chronic pain   Pain Radiating Towards None   Pain Onset More than a month ago   Pain Frequency Constant   Aggravating Factors  vacuuming, bending over.   Pain Relieving Factors heat and tylenol            [facility] Adult PT Treatment/Exercise - 07/21/15 0001    Bed Mobility   Bed Mobility Sit to Sidelying Left  reviewed proper mechanics, log rolling x 5 reps   Posture/Postural Control   Posture/Postural Control Postural limitations   Postural Limitations Rounded Shoulders;Forward head   Lumbar Exercises: Stretches   Lower Trunk Rotation Limitations 10x 5"   Lumbar Exercises: Supine   Ab Set 5 seconds;15 reps   Bent Knee Raise 10 reps;3 seconds   Bridge 10 reps   Straight Leg Raise 5 reps   Straight Leg Raises Limitations core braceing wiht UE pressdown and ab set prior SLR   Lumbar Exercises: Sidelying   Clam 10 reps   Manual Therapy   Manual Therapy Soft tissue mobilization  Manual therapy comments Performed separately from all other interventions   Soft tissue mobilization B QL, crossfriction on iliac crest, B Piriformis                  PT Short Term Goals - 07/16/15 1132    PT SHORT TERM GOAL #1   Title Pt will demo consistenct and independence with updated HEP   Time 2   Period Weeks   Status New   PT SHORT TERM GOAL #2   Title Pt will report decrased max pain report <5/10 to improve her tolerance with daily activity.   Time 3   Period Weeks   Status New   PT SHORT TERM GOAL #3   Title Pt will demo improved lumbar AROM to WNL to imrpove her ability to to perform house chores.    Time 3   Period Weeks   Status New   PT SHORT TERM GOAL #4   Title Pt will demo full understanding of log roll technique with correct performance during her session without cues from  therapist.   Time 2   Period Weeks   Status Achieved           PT Long Term Goals - 07/08/15 1728    PT LONG TERM GOAL #1   Title Pt will demo improved BLE strength to atleast 4/5 MMT to improve her safety with functional mobility   Time 6   Period Weeks   Status New   PT LONG TERM GOAL #2   Title pt will demo safe lifting mechanics evident by her ability to lift 10# object off the floor x5 trials without cuing from therapist.   Time 6   Period Weeks   Status New   PT LONG TERM GOAL #3   Title Pt will demo decrease in pain report to no greater than 3/10 to improve her tolerance to ADLs and activity.   Time 6   Period Weeks   Status New   PT LONG TERM GOAL #4   Title Pt will report improvement in back pain evident by her tolerance to household chores such as vacuuming, etc.    Time 6   Period Weeks   Status New   PT LONG TERM GOAL #5   Title Pt will demo improved functional strength evident by her ability to perform 5x sit to stand in less than 14 sec.    Time 6   Period Weeks   Status New               Plan - 07/21/15 1018    Clinical Impression Statement Reviewed proper body mechanics with bed mobiltiy with min verbal and tactile cueing for proper technqiue including precautions for Bil THA.  Session focus on improving core and proximal musculature strengthening as well as manual technqiues for pain control. Min to moderate cueing required to improve TrA activatin, added exercises to improve core bracing with movement. Manual soft tissue mobilization techniques complete to Bil QL and piriformis to remove overall tightness for pain control.  Pt reports pain reduced at end of session.     Rehab Potential Good   PT Frequency 2x / week   PT Duration 6 weeks   PT Treatment/Interventions ADLs/Self Care Home Management;Cryotherapy;Moist Heat;Electrical Stimulation;Therapeutic activities;Functional mobility training;Stair training;Gait training;Therapeutic exercise;Balance  training;Neuromuscular re-education;Patient/family education;Manual techniques;Passive range of motion   PT Next Visit Plan review proper log roll, LE strength (hip extension/abd), hip flexibility, abdominal bracing. progress LE/trunk strength as tolerated  PT Home Exercise Plan no additional exercises given today, did review compliance with HEP      Patient will benefit from skilled therapeutic intervention in order to improve the following deficits and impairments:  Decreased activity tolerance, Decreased endurance, Decreased safety awareness, Decreased range of motion, Decreased mobility, Decreased strength, Difficulty walking, Hypomobility, Impaired flexibility, Postural dysfunction, Pain, Improper body mechanics, Increased muscle spasms  Visit Diagnosis: Midline low back pain without sciatica  Muscle weakness (generalized)  Other symptoms and signs involving the musculoskeletal system  Other abnormalities of gait and mobility     Problem List Patient Active Problem List   Diagnosis Date Noted  . HTN (hypertension) 04/17/2014  . Dry eyes 04/14/2014  . Bleeding from the nose 04/01/2014  . Renal insufficiency 04/01/2014  . Rotator cuff arthropathy   . Muscle weakness (generalized) 05/26/2013  . Pain in joint, shoulder region 05/26/2013  . Rotator cuff syndrome 05/22/2013   Becky Sax, LPTA; CBIS [phone]  Juel Burrow 07/21/2015, 12:27 PM  [facility] [address] [place], [place], [address] Phone: [phone]   Fax:  [phone]  Name: Heather [name] MRN: [id number] Date of Birth: [birth year]

## 2015-07-23 ENCOUNTER — Ambulatory Visit (HOSPITAL_COMMUNITY): Payer: Commercial Managed Care - HMO | Admitting: Physical Therapy

## 2015-07-23 DIAGNOSIS — R2689 Other abnormalities of gait and mobility: Secondary | ICD-10-CM | POA: Diagnosis not present

## 2015-07-23 DIAGNOSIS — R29898 Other symptoms and signs involving the musculoskeletal system: Secondary | ICD-10-CM

## 2015-07-23 DIAGNOSIS — M545 Low back pain, unspecified: Secondary | ICD-10-CM

## 2015-07-23 DIAGNOSIS — M6281 Muscle weakness (generalized): Secondary | ICD-10-CM | POA: Diagnosis not present

## 2015-07-23 NOTE — Therapy (Signed)
[facility] [address] [place], [place], [address] Phone: [phone]   Fax:  [phone]  Physical Therapy Treatment  Patient Details  Name: Heather [name] MRN: [id number] Date of Birth: 11/30/1940 Referring Provider: Fuller Canada, MD  Encounter Date: 07/23/2015      PT End of Session - 07/23/15 1228    Visit Number 4   Number of Visits 12   Date for PT Re-Evaluation 07/28/15   Authorization Type [employer]   Authorization Time Period 07/07/15 to 08/18/15   Authorization - Visit Number 4   Authorization - Number of Visits 12   PT Start Time 1035   PT Stop Time 1115   PT Time Calculation (min) 40 min   Activity Tolerance Patient tolerated treatment well   Behavior During Therapy [facility] for tasks assessed/performed      Past Medical History  Diagnosis Date  . HTN (hypertension)   . COPD (chronic obstructive pulmonary disease) (HCC)   . GERD (gastroesophageal reflux disease)   . Glaucoma   . Anxiety   . Depression   . Anemia   . Arthritis   . Gout     Past Surgical History  Procedure Laterality Date  . Joint replacement Bilateral     hip   . Joint replacement Right     knee  . Shoulder arthroscopy Right   . Abdominal hysterectomy    . Foot surgery Left     repair of "kissing Cousins"  . Bunionectomy Bilateral   . Back surgery      lumbar-disc  . Shoulder hemi-arthroplasty Left 03/25/2014    Procedure: LEFT SHOULDER HEMI-ARTHROPLASTY;  Surgeon: Vickki Hearing, MD;  Location: [facility];  Service: Orthopedics;  Laterality: Left;    There were no vitals filed for this visit.      Subjective Assessment - 07/23/15 1042    Subjective Pt states she went to a cardio class earlier for 1 hour and had to stop occasinoally due to her back starting to hurt. She didn't do her exercises yesterday because of pain. Currently in 7/10 pain but feels it is improving and she is able to stand taller than normal.    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), R TKA, R shoulder replacement   Patient Stated Goals get rid of the pain   Pain Score 7    Pain Location Back   Pain Orientation Lower                         [facility] Adult PT Treatment/Exercise - 07/23/15 0001    Exercises   Other Exercises  supine DKTC with feet propped on 6" block x15 reps   Lumbar Exercises: Standing   Wall Slides Limitations x15 reps   Lumbar Exercises: Supine   Other Supine Lumbar Exercises bent knee lower trunk rotation x20   Other Supine Lumbar Exercises bent knee fallout x10 each   Manual Therapy   Manual Therapy Passive ROM;Joint mobilization   Manual therapy comments Performed separately from all other interventions   Joint Mobilization Grade II-III PA jt mobs to lumbar spine and sacrum   Passive ROM B piriformis stretch                PT Education - 07/23/15 1224    Education provided Yes   Education Details discussed importance of consistent HEP adherence; reviewed technique with therex and abdominal bracing   Person(s)  Educated Patient   Methods Explanation;Demonstration;Tactile cues   Comprehension Verbalized understanding;Need further instruction          PT Short Term Goals - 07/16/15 1132    PT SHORT TERM GOAL #1   Title Pt will demo consistenct and independence with updated HEP   Time 2   Period Weeks   Status New   PT SHORT TERM GOAL #2   Title Pt will report decrased max pain report <5/10 to improve her tolerance with daily activity.   Time 3   Period Weeks   Status New   PT SHORT TERM GOAL #3   Title Pt will demo improved lumbar AROM to WNL to imrpove her ability to to perform house chores.    Time 3   Period Weeks   Status New   PT SHORT TERM GOAL #4   Title Pt will demo full understanding of log roll technique with correct performance during her session without cues from therapist.   Time 2   Period Weeks   Status Achieved           PT  Long Term Goals - 07/08/15 1728    PT LONG TERM GOAL #1   Title Pt will demo improved BLE strength to atleast 4/5 MMT to improve her safety with functional mobility   Time 6   Period Weeks   Status New   PT LONG TERM GOAL #2   Title pt will demo safe lifting mechanics evident by her ability to lift 10# object off the floor x5 trials without cuing from therapist.   Time 6   Period Weeks   Status New   PT LONG TERM GOAL #3   Title Pt will demo decrease in pain report to no greater than 3/10 to improve her tolerance to ADLs and activity.   Time 6   Period Weeks   Status New   PT LONG TERM GOAL #4   Title Pt will report improvement in back pain evident by her tolerance to household chores such as vacuuming, etc.    Time 6   Period Weeks   Status New   PT LONG TERM GOAL #5   Title Pt will demo improved functional strength evident by her ability to perform 5x sit to stand in less than 14 sec.    Time 6   Period Weeks   Status New               Plan - 07/23/15 1231    Clinical Impression Statement Today's session focused on therex to improve activation of deep abdominal musculature with pt continuing to need verbal/tactile cues to correctly perform. Pt tolerating manual treatment well with some improvement noted by the end of today's session. Overall she is progressing towards her goals and demonstrates improved standing posture and pain. Will continue with current POC.   Rehab Potential Good   PT Frequency 2x / week   PT Duration 6 weeks   PT Treatment/Interventions ADLs/Self Care Home Management;Cryotherapy;Moist Heat;Electrical Stimulation;Therapeutic activities;Functional mobility training;Stair training;Gait training;Therapeutic exercise;Balance training;Neuromuscular re-education;Patient/family education;Manual techniques;Passive range of motion   PT Next Visit Plan progress LE/trunk strength as tolerated, continue to encourage performance of HEP. Possible cat/camel for  lumbar ROM if tolerated well    PT Home Exercise Plan no additional exercises given today, reviewed compliance with HEP   Recommended Other Services none    Consulted and Agree with Plan of Care Patient      Patient will benefit from skilled  therapeutic intervention in order to improve the following deficits and impairments:  Decreased activity tolerance, Decreased endurance, Decreased safety awareness, Decreased range of motion, Decreased mobility, Decreased strength, Difficulty walking, Hypomobility, Impaired flexibility, Postural dysfunction, Pain, Improper body mechanics, Increased muscle spasms  Visit Diagnosis: Midline low back pain without sciatica  Muscle weakness (generalized)  Other symptoms and signs involving the musculoskeletal system  Other abnormalities of gait and mobility     Problem List Patient Active Problem List   Diagnosis Date Noted  . HTN (hypertension) 04/17/2014  . Dry eyes 04/14/2014  . Bleeding from the nose 04/01/2014  . Renal insufficiency 04/01/2014  . Rotator cuff arthropathy   . Muscle weakness (generalized) 05/26/2013  . Pain in joint, shoulder region 05/26/2013  . Rotator cuff syndrome 05/22/2013    12:39 PM,07/23/2015 Marylyn Ishihara PT, DPT [facility] [phone]  [facility] [address] [place], [place], [address] Phone: [phone]   Fax:  [phone]  Name: Heather [name] MRN: [id number] Date of Birth: [birth year]

## 2015-07-26 ENCOUNTER — Ambulatory Visit (HOSPITAL_COMMUNITY): Payer: Commercial Managed Care - HMO | Admitting: Physical Therapy

## 2015-07-26 ENCOUNTER — Telehealth (HOSPITAL_COMMUNITY): Payer: Self-pay | Admitting: Physical Therapy

## 2015-07-26 DIAGNOSIS — R600 Localized edema: Secondary | ICD-10-CM | POA: Diagnosis not present

## 2015-07-26 DIAGNOSIS — E669 Obesity, unspecified: Secondary | ICD-10-CM | POA: Diagnosis not present

## 2015-07-26 DIAGNOSIS — M1990 Unspecified osteoarthritis, unspecified site: Secondary | ICD-10-CM | POA: Diagnosis not present

## 2015-07-26 DIAGNOSIS — E785 Hyperlipidemia, unspecified: Secondary | ICD-10-CM | POA: Diagnosis not present

## 2015-07-26 DIAGNOSIS — I10 Essential (primary) hypertension: Secondary | ICD-10-CM | POA: Diagnosis not present

## 2015-07-26 DIAGNOSIS — J449 Chronic obstructive pulmonary disease, unspecified: Secondary | ICD-10-CM | POA: Diagnosis not present

## 2015-07-26 NOTE — Telephone Encounter (Signed)
LMOM: notified pt of missed apt this AM. reminded her of upcoming apt on 07/29/15 and provided # to clinic to call with any questions/concerns.    10:31 AM,07/26/2015 Marylyn Ishihara PT, DPT [facility] [phone]

## 2015-07-29 ENCOUNTER — Encounter (HOSPITAL_COMMUNITY): Payer: Self-pay | Admitting: Physical Therapy

## 2015-07-29 ENCOUNTER — Ambulatory Visit (HOSPITAL_COMMUNITY): Payer: Commercial Managed Care - HMO | Admitting: Physical Therapy

## 2015-07-29 ENCOUNTER — Other Ambulatory Visit (HOSPITAL_COMMUNITY): Payer: Self-pay | Admitting: Internal Medicine

## 2015-07-29 ENCOUNTER — Ambulatory Visit (HOSPITAL_COMMUNITY)
Admission: RE | Admit: 2015-07-29 | Discharge: 2015-07-29 | Disposition: A | Discharge status: Home / Self Care | Payer: Commercial Managed Care - HMO | Source: Ambulatory Visit | Attending: Internal Medicine | Admitting: Internal Medicine

## 2015-07-29 DIAGNOSIS — R29898 Other symptoms and signs involving the musculoskeletal system: Secondary | ICD-10-CM

## 2015-07-29 DIAGNOSIS — R2689 Other abnormalities of gait and mobility: Secondary | ICD-10-CM

## 2015-07-29 DIAGNOSIS — R609 Edema, unspecified: Secondary | ICD-10-CM

## 2015-07-29 DIAGNOSIS — M545 Low back pain, unspecified: Secondary | ICD-10-CM

## 2015-07-29 DIAGNOSIS — M6281 Muscle weakness (generalized): Secondary | ICD-10-CM | POA: Diagnosis not present

## 2015-07-29 NOTE — Therapy (Signed)
[place], [place], [address] Phone: [phone]   Fax:  [phone]  Physical Therapy Treatment  Patient Details  Name: Heather [name] MRN: [id number] Date of Birth: June 01, 1940 Referring Provider: Arther Abbott, MD  Encounter Date: 07/29/2015      PT End of Session - 07/29/15 1115    Visit Number 5   Number of Visits 12   Date for PT Re-Evaluation 08/18/15   Authorization Type [employer]   Authorization Time Period 07/07/15 to 08/18/15   Authorization - Visit Number 5   Authorization - Number of Visits 12   PT Start Time 1030   PT Stop Time 1113   PT Time Calculation (min) 43 min   Activity Tolerance Patient tolerated treatment well   Behavior During Therapy [facility] for tasks assessed/performed      Past Medical History  Diagnosis Date  . HTN (hypertension)   . COPD (chronic obstructive pulmonary disease) ([place])   . GERD (gastroesophageal reflux disease)   . Glaucoma   . Anxiety   . Depression   . Anemia   . Arthritis   . Gout     Past Surgical History  Procedure Laterality Date  . Joint replacement Bilateral     hip   . Joint replacement Right     knee  . Shoulder arthroscopy Right   . Abdominal hysterectomy    . Foot surgery Left     repair of "kissing Cousins"  . Bunionectomy Bilateral   . Back surgery      lumbar-disc  . Shoulder hemi-arthroplasty Left 03/25/2014    Procedure: LEFT SHOULDER HEMI-ARTHROPLASTY;  Surgeon: Carole Civil, MD;  Location: [facility];  Service: Orthopedics;  Laterality: Left;    There were no vitals filed for this visit.      Subjective Assessment - 07/29/15 1034    Subjective Pt states she is doing good lately. She didn't do her HEP yesterday and she was so tired after her cardio class, she physically couldn't do her exercises. She reports taking Lisinopril which was addedd several days ago by her PCP. She feels she had made some improvements since  beginnign therapy. She is walking better. She feels she has improved 70%.    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 45 minutes   How long can you stand comfortably? 45 minutes   How long can you walk comfortably? 20 minutes   Diagnostic tests Xray: arthritis L5/S1   Patient Stated Goals get rid of the pain   Currently in Pain? No/denies            [facility] PT Assessment - 07/29/15 0001    Assessment   Medical Diagnosis Lumbar spine pain   Referring Provider Arther Abbott, MD   Onset Date/Surgical Date --  6-65mo  Next MD Visit Agust 2017   Prior Therapy 2012 low back   Precautions   Precautions None   Restrictions   Weight Bearing Restrictions No   Balance Screen   Has the patient fallen in the past 6 months No   Has the patient had a decrease in activity level because of a fear of falling?  No   Is the patient reluctant to leave their home because of a fear of falling?  No   Home EE[place]residence  Living Arrangements Alone   Type of Home Apartment   Home Access Level entry   Prior Function   Level of Independence Independent   Vocation Retired   Leisure reading, gardening, baking, traveling   Observation/Other Assessments   Observations standing: ASIS, illiac crest appear equal   Sensation   Light Touch Appears Intact   Posture/Postural Control   Posture/Postural Control Postural limitations   Postural Limitations Rounded Shoulders;Forward head   AROM   Lumbar Flexion touches toes  no pain   Lumbar Extension 25% limtied  no pain   Lumbar - Right Side Bend below knee jt line, pain free   Lumbar - Left Side Bend below knee jt, no pain    Lumbar - Right Rotation 25% limited   Lumbar - Left Rotation 25% limited   Strength   Right Hip Flexion 4-/5   Right Hip Extension 3-/5   Right Hip ABduction 3+/5   Left  Hip Flexion 3+/5   Left Hip Extension 3-/5   Left Hip ABduction 3+/5   Right Knee Flexion 5/5   Right Knee Extension 5/5   Left Knee Flexion 5/5   Left Knee Extension 5/5   Right Ankle Dorsiflexion 5/5   Left Ankle Dorsiflexion 5/5   Flexibility   Soft Tissue Assessment /Muscle Length yes   Quadriceps (+) L>R; (+) hip flexor   Piriformis 25% limited  BLE   Palpation   Palpation comment TTP along SIJ, piriformis (L>R), lumbar paraspinals   Special Tests    Special Tests Lumbar;Sacrolliac Tests   Lumbar Tests Slump Test;Prone Knee Bend Test;Straight Leg Raise   Slump test   Findings Negative   Prone Knee Bend Test   Findings Negative   Straight Leg Raise   Findings Negative   Bed Mobility   Bed Mobility --  poor technique with sit to/from supine using sit up form   Transfers   Five time sit to stand comments  17  no UE   6 minute walk test results    Endurance additional comments 3 MWT: 500 ft no AD                     [facility] Adult PT Treatment/Exercise - 07/29/15 0001    Lumbar Exercises: Sidelying   Clam 10 reps   Clam Limitations green TB x1 set   Hip Abduction 10 reps   Hip Abduction Limitations against wall, AAROM with RLE   Knee/Hip Exercises: Stretches   Hip Flexor Stretch Left;20 seconds;3 reps   Hip Flexor Stretch Limitations supine with strap   Knee/Hip Exercises: Sidelying   Hip ADduction Both;1 set;10 reps  against wall                 PT Education - 07/29/15 1114    Education provided Yes   Education Details discussed reassessment findings/goals/POC; updated HEP; encouraged stretching on days where she goes to her cardio class.   Person(s) Educated Patient   Methods Explanation;Demonstration;Handout   Comprehension Verbalized understanding;Returned demonstration          PT Short Term Goals - 07/29/15 1053    PT SHORT TERM GOAL #1   Title Pt will demo consistenct and independence with updated HEP   Time 2   Period Weeks    Status Achieved   PT SHORT TERM GOAL #2   Title Pt will report decrased max pain report <5/10 to improve her tolerance with daily activity.   Baseline 8/10 after finishing her  cardio class   Time 3   Period Weeks   Status New   PT SHORT TERM GOAL #3   Title Pt will demo improved lumbar AROM to WNL to imrpove her ability to to perform house chores.    Baseline extension and rotation remain limited   Time 3   Period Weeks   Status Partially Met   PT SHORT TERM GOAL #4   Title Pt will demo full understanding of log roll technique with correct performance during her session without cues from therapist.   Time 2   Period Weeks   Status Achieved           PT Long Term Goals - 07/29/15 1054    PT LONG TERM GOAL #1   Title Pt will demo improved BLE strength to atleast 4/5 MMT to improve her safety with functional mobility   Time 6   Period Weeks   Status New   PT LONG TERM GOAL #2   Title pt will demo safe lifting mechanics evident by her ability to lift 10# object off the floor x5 trials without cuing from therapist.   Time 6   Period Weeks   Status Not Met   PT LONG TERM GOAL #3   Title Pt will demo decrease in pain report to no greater than 3/10 to improve her tolerance to ADLs and activity.   Time 6   Period Weeks   Status Not Met   PT LONG TERM GOAL #4   Title Pt will report improvement in back pain evident by her tolerance to household chores such as vacuuming, etc.    Time 6   Period Weeks   Status New   PT LONG TERM GOAL #5   Title Pt will demo improved functional strength evident by her ability to perform 5x sit to stand in less than 14 sec.    Baseline 17 sec without UE   Time 6   Period Weeks   Status Partially Met               Plan - 07/29/15 1337    Clinical Impression Statement Pt was reassessed this session having met several of her goals and progressing towards others. She demonstrates improved functional strength and endurance, lumbar ROM,  decreased pain intensity/frequency, as well as some improvements in her BLE strength and flexibility. She continues to demonstrate hip weakness which is evident by MMT and her inability to perform increased reps and resistance with LE therex. Therapist updated HEP to target remaining limitations and encouraged pt to continue with HEP even on days she attends cardio class, even if it is just her stretches. Pt verbalized understanding at this time. Otherwise, will continue with current POC.    Rehab Potential Good   PT Frequency 2x / week   PT Duration 6 weeks   PT Treatment/Interventions ADLs/Self Care Home Management;Cryotherapy;Moist Heat;Electrical Stimulation;Therapeutic activities;Functional mobility training;Stair training;Gait training;Therapeutic exercise;Balance training;Neuromuscular re-education;Patient/family education;Manual techniques;Passive range of motion   PT Next Visit Plan hip extensor/abductor strength (clams, abduction, bridges, prone hip extension, functional squats), hip flexibility (hip flexor, quad); cat/camel for lumbar ROM   PT Home Exercise Plan updated with clams (green TB) and hip flexor stretch with strap   Recommended Other Services None   Consulted and Agree with Plan of Care Patient      Patient will benefit from skilled therapeutic intervention in order to improve the following deficits and impairments:  Decreased activity tolerance, Decreased endurance, Decreased safety awareness,  Decreased range of motion, Decreased mobility, Decreased strength, Difficulty walking, Hypomobility, Impaired flexibility, Postural dysfunction, Pain, Improper body mechanics, Increased muscle spasms  Visit Diagnosis: Midline low back pain without sciatica  Muscle weakness (generalized)  Other symptoms and signs involving the musculoskeletal system  Other abnormalities of gait and mobility     Problem List Patient Active Problem List   Diagnosis Date Noted  . HTN  (hypertension) 04/17/2014  . Dry eyes 04/14/2014  . Bleeding from the nose 04/01/2014  . Renal insufficiency 04/01/2014  . Rotator cuff arthropathy   . Muscle weakness (generalized) 05/26/2013  . Pain in joint, shoulder region 05/26/2013  . Rotator cuff syndrome 05/22/2013   3:47 PM,07/29/2015 Elly Modena PT, DPT [facility] [place] [address] [place], [place], [address] Phone: [phone]   Fax:  [phone]  Name: Heather [name] MRN: [id number] Date of Birth: [birth year]

## 2015-07-29 NOTE — Progress Notes (Signed)
*  PRELIMINARY RESULTS* Echocardiogram 2D Echocardiogram has been performed.  Jeryl Columbia 07/29/2015, 1:54 PM

## 2015-08-04 ENCOUNTER — Ambulatory Visit (HOSPITAL_COMMUNITY): Payer: Commercial Managed Care - HMO

## 2015-08-04 DIAGNOSIS — R2689 Other abnormalities of gait and mobility: Secondary | ICD-10-CM

## 2015-08-04 DIAGNOSIS — M545 Low back pain, unspecified: Secondary | ICD-10-CM

## 2015-08-04 DIAGNOSIS — M6281 Muscle weakness (generalized): Secondary | ICD-10-CM | POA: Diagnosis not present

## 2015-08-04 DIAGNOSIS — R29898 Other symptoms and signs involving the musculoskeletal system: Secondary | ICD-10-CM

## 2015-08-04 NOTE — Therapy (Signed)
[place], [place], [address] Phone: [phone]   Fax:  [phone]  Physical Therapy Treatment  Patient Details  Name: Heather [name] MRN: [id number] Date of Birth: Oct 21, 1940 Referring Provider: Arther Abbott, MD  Encounter Date: 08/04/2015      PT End of Session - 08/04/15 1004    Visit Number 6   Number of Visits 12   Date for PT Re-Evaluation 08/18/15   Authorization Type [employer]   Authorization Time Period 07/07/15 to 08/18/15   Authorization - Visit Number 6   Authorization - Number of Visits 12   PT Start Time 1916   PT Stop Time 1034   PT Time Calculation (min) 46 min   Activity Tolerance Patient tolerated treatment well   Behavior During Therapy [facility] for tasks assessed/performed      Past Medical History  Diagnosis Date  . HTN (hypertension)   . COPD (chronic obstructive pulmonary disease) ([place])   . GERD (gastroesophageal reflux disease)   . Glaucoma   . Anxiety   . Depression   . Anemia   . Arthritis   . Gout     Past Surgical History  Procedure Laterality Date  . Joint replacement Bilateral     hip   . Joint replacement Right     knee  . Shoulder arthroscopy Right   . Abdominal hysterectomy    . Foot surgery Left     repair of "kissing Cousins"  . Bunionectomy Bilateral   . Back surgery      lumbar-disc  . Shoulder hemi-arthroplasty Left 03/25/2014    Procedure: LEFT SHOULDER HEMI-ARTHROPLASTY;  Surgeon: Carole Civil, MD;  Location: [facility];  Service: Orthopedics;  Laterality: Left;    There were no vitals filed for this visit.      Subjective Assessment - 08/04/15 0946    Subjective Pt stated she is doing well today.  Reports compliance with HEP daily and did cardiovascular therapy prior PT session today/  No reports of pain.     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 No/denies                         [facility] Adult PT Treatment/Exercise - 08/04/15 0001    Lumbar Exercises: Stretches   Quadruped Mid Back Stretch 4 reps;10 seconds   Quadruped Mid Back Stretch Limitations cat/camel   Quad Stretch 2 reps;30 seconds   Quad Stretch Limitations standing hip flexor st 2x 30" on 12in step   Lumbar Exercises: Standing   Functional Squats 10 reps   Functional Squats Limitations initial cueing for form   Lumbar Exercises: Supine   Bridge 20 reps  2 sets x 10 reps with RTB around knees   Lumbar Exercises: Sidelying   Clam 10 reps   Clam Limitations green TB x1 set   Hip Abduction 15 reps   Hip Abduction Limitations against wall, AAROM with RLE   Lumbar Exercises: Prone   Straight Leg Raise 10 reps  2 sets x 10 reps                  PT Short Term Goals - 07/29/15 1053    PT SHORT TERM GOAL #1   Title Pt will demo consistenct and independence with updated HEP  Time 2   Period Weeks   Status Achieved   PT SHORT TERM GOAL #2   Title Pt will report decrased max pain report <5/10 to improve her tolerance with daily activity.   Baseline 8/10 after finishing her cardio class   Time 3   Period Weeks   Status New   PT SHORT TERM GOAL #3   Title Pt will demo improved lumbar AROM to WNL to imrpove her ability to to perform house chores.    Baseline extension and rotation remain limited   Time 3   Period Weeks   Status Partially Met   PT SHORT TERM GOAL #4   Title Pt will demo full understanding of log roll technique with correct performance during her session without cues from therapist.   Time 2   Period Weeks   Status Achieved           PT Long Term Goals - 07/29/15 1054    PT LONG TERM GOAL #1   Title Pt will demo improved BLE strength to atleast 4/5 MMT to improve her safety with functional mobility   Time 6   Period Weeks   Status New   PT LONG TERM GOAL #2   Title pt will demo safe  lifting mechanics evident by her ability to lift 10# object off the floor x5 trials without cuing from therapist.   Time 6   Period Weeks   Status Not Met   PT LONG TERM GOAL #3   Title Pt will demo decrease in pain report to no greater than 3/10 to improve her tolerance to ADLs and activity.   Time 6   Period Weeks   Status Not Met   PT LONG TERM GOAL #4   Title Pt will report improvement in back pain evident by her tolerance to household chores such as vacuuming, etc.    Time 6   Period Weeks   Status New   PT LONG TERM GOAL #5   Title Pt will demo improved functional strength evident by her ability to perform 5x sit to stand in less than 14 sec.    Baseline 17 sec without UE   Time 6   Period Weeks   Status Partially Met               Plan - 08/04/15 1004    Clinical Impression Statement Session focus on improving Bil LE hip strengthening and lumbar mobility.  Pt able to demonstrate approriate techniuques with majority of exercises following cueing for form.  No reports of pain through session, does continues to exhibit weakness Bil hips with decreased ROM with prone SLR and AAROM required with Rt S/L abd.  Added quadruped cat/camel to improve lumbar mobility, pt relectant initially with thoughts of weight bearing on knees and shoulder, pt able to complete with therapist facilitaiton  wtih no reports of pain with activity.     Rehab Potential Good   PT Frequency 2x / week   PT Duration 6 weeks   PT Treatment/Interventions ADLs/Self Care Home Management;Cryotherapy;Moist Heat;Electrical Stimulation;Therapeutic activities;Functional mobility training;Stair training;Gait training;Therapeutic exercise;Balance training;Neuromuscular re-education;Patient/family education;Manual techniques;Passive range of motion   PT Next Visit Plan hip extensor/abductor strength (clams, abduction, bridges, prone hip extension, functional squats), hip flexibility (hip flexor, quad); cat/camel for  lumbar ROM      Patient will benefit from skilled therapeutic intervention in order to improve the following deficits and impairments:  Decreased activity tolerance, Decreased endurance, Decreased safety awareness, Decreased range  of motion, Decreased mobility, Decreased strength, Difficulty walking, Hypomobility, Impaired flexibility, Postural dysfunction, Pain, Improper body mechanics, Increased muscle spasms  Visit Diagnosis: Midline low back pain without sciatica  Muscle weakness (generalized)  Other symptoms and signs involving the musculoskeletal system  Other abnormalities of gait and mobility     Problem List Patient Active Problem List   Diagnosis Date Noted  . HTN (hypertension) 04/17/2014  . Dry eyes 04/14/2014  . Bleeding from the nose 04/01/2014  . Renal insufficiency 04/01/2014  . Rotator cuff arthropathy   . Muscle weakness (generalized) 05/26/2013  . Pain in joint, shoulder region 05/26/2013  . Rotator cuff syndrome 05/22/2013   Ihor Austin, LPTA; [place]  Aldona Lento 08/04/2015, 12:17 PM  [place] [address] [place], [place], [address] Phone: [phone]   Fax:  [phone]  Name: Heather [name] MRN: [id number] Date of Birth: [birth year]

## 2015-08-06 ENCOUNTER — Ambulatory Visit (HOSPITAL_COMMUNITY): Payer: Commercial Managed Care - HMO | Attending: Orthopedic Surgery | Admitting: Physical Therapy

## 2015-08-06 DIAGNOSIS — M6281 Muscle weakness (generalized): Secondary | ICD-10-CM | POA: Insufficient documentation

## 2015-08-06 DIAGNOSIS — M545 Low back pain, unspecified: Secondary | ICD-10-CM

## 2015-08-06 DIAGNOSIS — R2689 Other abnormalities of gait and mobility: Secondary | ICD-10-CM | POA: Insufficient documentation

## 2015-08-06 DIAGNOSIS — R29898 Other symptoms and signs involving the musculoskeletal system: Secondary | ICD-10-CM | POA: Diagnosis not present

## 2015-08-06 NOTE — Therapy (Signed)
[place], [place], [address] Phone: [phone]   Fax:  [phone]  Physical Therapy Treatment  Patient Details  Name: Heather [name] MRN: [id number] Date of Birth: [birth year] Referring Provider: Arther Abbott, MD  Encounter Date: 08/06/2015      PT End of Session - 08/06/15 1046    Visit Number 7   Number of Visits 12   Date for PT Re-Evaluation 08/18/15   Authorization Type [employer]   Authorization Time Period 07/07/15 to 08/18/15   Authorization - Visit Number 7   Authorization - Number of Visits 12   PT Start Time 0950   PT Stop Time 1028   PT Time Calculation (min) 38 min   Activity Tolerance Patient tolerated treatment well   Behavior During Therapy [facility] for tasks assessed/performed      Past Medical History  Diagnosis Date  . HTN (hypertension)   . COPD (chronic obstructive pulmonary disease) ([place])   . GERD (gastroesophageal reflux disease)   . Glaucoma   . Anxiety   . Depression   . Anemia   . Arthritis   . Gout     Past Surgical History  Procedure Laterality Date  . Joint replacement Bilateral     hip   . Joint replacement Right     knee  . Shoulder arthroscopy Right   . Abdominal hysterectomy    . Foot surgery Left     repair of "kissing Cousins"  . Bunionectomy Bilateral   . Back surgery      lumbar-disc  . Shoulder hemi-arthroplasty Left 03/25/2014    Procedure: LEFT SHOULDER HEMI-ARTHROPLASTY;  Surgeon: Carole Civil, MD;  Location: [facility];  Service: Orthopedics;  Laterality: Left;    There were no vitals filed for this visit.      Subjective Assessment - 08/06/15 0951    Subjective Pt states she is doing well today. Reports she has been compliant with her HEP daily and thinks they are getting a little easier. No pain currently.   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 No/denies                         [facility] Adult PT Treatment/Exercise - 08/06/15 0001    Ambulation/Gait   Ambulation/Gait Yes   Ambulation Distance (Feet) 452 Feet   Gait Comments noticing pt waddling during ambulation with decreased hip extension. Therapist cuing for improve hip extension and awareness at home; Ascending/descending 6" steps with 1 HR and with initial step to pattern. Therapist encouraging use of reciprocal pattern and pt able to do so without difficulty.    Lumbar Exercises: Standing   Wall Slides Limitations x15 reps   Lumbar Exercises: Sidelying   Hip Abduction 10 reps;15 reps  x2 sets each   Hip Abduction Limitations against wall. AAROM with RLE   Knee/Hip Exercises: Standing   Forward Step Up Both;1 set;10 reps;Hand Hold: 1;Step Height: 6"  contralateral hip flexion                 PT Education - 08/06/15 1045    Education provided Yes   Education Details discussed POC; updated HEP and encouraged daily adherence to improve LE strength; discussed gradually beginning a walking program to improve cardio fitness    Person(s) Educated Patient  Methods Explanation;Demonstration;Handout   Comprehension Verbalized understanding;Returned demonstration          PT Short Term Goals - 07/29/15 1053    PT SHORT TERM GOAL #1   Title Pt will demo consistenct and independence with updated HEP   Time 2   Period Weeks   Status Achieved   PT SHORT TERM GOAL #2   Title Pt will report decrased max pain report <5/10 to improve her tolerance with daily activity.   Baseline 8/10 after finishing her cardio class   Time 3   Period Weeks   Status New   PT SHORT TERM GOAL #3   Title Pt will demo improved lumbar AROM to WNL to imrpove her ability to to perform house chores.    Baseline extension and rotation remain limited   Time 3   Period Weeks   Status Partially Met   PT SHORT TERM GOAL #4   Title Pt will demo full  understanding of log roll technique with correct performance during her session without cues from therapist.   Time 2   Period Weeks   Status Achieved           PT Long Term Goals - 07/29/15 1054    PT LONG TERM GOAL #1   Title Pt will demo improved BLE strength to atleast 4/5 MMT to improve her safety with functional mobility   Time 6   Period Weeks   Status New   PT LONG TERM GOAL #2   Title pt will demo safe lifting mechanics evident by her ability to lift 10# object off the floor x5 trials without cuing from therapist.   Time 6   Period Weeks   Status Not Met   PT LONG TERM GOAL #3   Title Pt will demo decrease in pain report to no greater than 3/10 to improve her tolerance to ADLs and activity.   Time 6   Period Weeks   Status Not Met   PT LONG TERM GOAL #4   Title Pt will report improvement in back pain evident by her tolerance to household chores such as vacuuming, etc.    Time 6   Period Weeks   Status New   PT LONG TERM GOAL #5   Title Pt will demo improved functional strength evident by her ability to perform 5x sit to stand in less than 14 sec.    Baseline 17 sec without UE   Time 6   Period Weeks   Status Partially Met               Plan - 08/06/15 1047    Clinical Impression Statement Today's session focused on progressions of LE therex and addition of more functional strengthening of the knee/hip. Pt able to perform progressions without increase in pain. Discussed implementing a walking program to improve fitness and strength as well as reviewing some equipment for possible carry over to the [facility] for further LE strengthening. Pt verbalizing interest in this and understanding of all therex added to her HEP this visit.   Rehab Potential Good   PT Frequency 2x / week   PT Duration 6 weeks   PT Treatment/Interventions ADLs/Self Care Home Management;Cryotherapy;Moist Heat;Electrical Stimulation;Therapeutic activities;Functional mobility training;Stair  training;Gait training;Therapeutic exercise;Balance training;Neuromuscular re-education;Patient/family education;Manual techniques;Passive range of motion   PT Next Visit Plan  work on functinoal strength, cat/camel for lumbar ROM   PT Home Exercise Plan updated with green TB clamshells, wall squats   Consulted and Agree  with Plan of Care Patient      Patient will benefit from skilled therapeutic intervention in order to improve the following deficits and impairments:  Decreased activity tolerance, Decreased endurance, Decreased safety awareness, Decreased range of motion, Decreased mobility, Decreased strength, Difficulty walking, Hypomobility, Impaired flexibility, Postural dysfunction, Pain, Improper body mechanics, Increased muscle spasms  Visit Diagnosis: Midline low back pain without sciatica  Muscle weakness (generalized)  Other symptoms and signs involving the musculoskeletal system  Other abnormalities of gait and mobility     Problem List Patient Active Problem List   Diagnosis Date Noted  . HTN (hypertension) 04/17/2014  . Dry eyes 04/14/2014  . Bleeding from the nose 04/01/2014  . Renal insufficiency 04/01/2014  . Rotator cuff arthropathy   . Muscle weakness (generalized) 05/26/2013  . Pain in joint, shoulder region 05/26/2013  . Rotator cuff syndrome 05/22/2013   11:04 AM,08/06/2015 Elly Modena PT, DPT [facility] [place] [address] [place], [place], [address] Phone: [phone]   Fax:  [phone]  Name: ELICIA LUI MRN: [id number] Date of Birth: [birth year]

## 2015-08-09 ENCOUNTER — Telehealth (HOSPITAL_COMMUNITY): Payer: Self-pay

## 2015-08-09 ENCOUNTER — Encounter (HOSPITAL_COMMUNITY): Payer: Commercial Managed Care - HMO | Admitting: Physical Therapy

## 2015-08-09 DIAGNOSIS — E785 Hyperlipidemia, unspecified: Secondary | ICD-10-CM | POA: Diagnosis not present

## 2015-08-09 DIAGNOSIS — I10 Essential (primary) hypertension: Secondary | ICD-10-CM | POA: Diagnosis not present

## 2015-08-09 DIAGNOSIS — J449 Chronic obstructive pulmonary disease, unspecified: Secondary | ICD-10-CM | POA: Diagnosis not present

## 2015-08-09 DIAGNOSIS — T7849XA Other allergy, initial encounter: Secondary | ICD-10-CM | POA: Diagnosis not present

## 2015-08-09 NOTE — Telephone Encounter (Signed)
08/09/15 cx said she had to go to the dr this morning

## 2015-08-12 ENCOUNTER — Ambulatory Visit (HOSPITAL_COMMUNITY): Payer: Commercial Managed Care - HMO | Admitting: Physical Therapy

## 2015-08-12 DIAGNOSIS — M545 Low back pain, unspecified: Secondary | ICD-10-CM

## 2015-08-12 DIAGNOSIS — M6281 Muscle weakness (generalized): Secondary | ICD-10-CM

## 2015-08-12 DIAGNOSIS — R2689 Other abnormalities of gait and mobility: Secondary | ICD-10-CM | POA: Diagnosis not present

## 2015-08-12 DIAGNOSIS — R29898 Other symptoms and signs involving the musculoskeletal system: Secondary | ICD-10-CM | POA: Diagnosis not present

## 2015-08-12 NOTE — Therapy (Signed)
[place], [place], [address] Phone: [phone]   Fax:  [phone]  Physical Therapy Treatment  Patient Details  Name: CHARMA MOCARSKI MRN: [id number] Date of Birth: 06-Mar-1941 Referring Provider: Arther Abbott, MD  Encounter Date: 08/12/2015      PT End of Session - 08/12/15 1741    Visit Number 8   Number of Visits 12   Date for PT Re-Evaluation 08/18/15   Authorization Type [employer]   Authorization Time Period 07/07/15 to 08/18/15   Authorization - Visit Number 8   Authorization - Number of Visits 12   PT Start Time 0901   PT Stop Time 0951   PT Time Calculation (min) 50 min   Activity Tolerance Patient tolerated treatment well   Behavior During Therapy [facility] for tasks assessed/performed      Past Medical History  Diagnosis Date  . HTN (hypertension)   . COPD (chronic obstructive pulmonary disease) ([place])   . GERD (gastroesophageal reflux disease)   . Glaucoma   . Anxiety   . Depression   . Anemia   . Arthritis   . Gout     Past Surgical History  Procedure Laterality Date  . Joint replacement Bilateral     hip   . Joint replacement Right     knee  . Shoulder arthroscopy Right   . Abdominal hysterectomy    . Foot surgery Left     repair of "kissing Cousins"  . Bunionectomy Bilateral   . Back surgery      lumbar-disc  . Shoulder hemi-arthroplasty Left 03/25/2014    Procedure: LEFT SHOULDER HEMI-ARTHROPLASTY;  Surgeon: Carole Civil, MD;  Location: [facility];  Service: Orthopedics;  Laterality: Left;    There were no vitals filed for this visit.      Subjective Assessment - 08/12/15 1732    Subjective Pt states she is doing good today. She has been doing her HEP and attending the classes at the [facility] and feels she has made a 75% improvement since beginning therapy.   Pertinent History HTN, gerd, COPD, depression, B THA( 90s/2002), R TKA, R shoulder replacement   How long can you  sit comfortably? unlimited    How long can you stand comfortably? 45 minutes   How long can you walk comfortably? unsure   Diagnostic tests Xray: arthritis L5/S1   Patient Stated Goals get rid of the pain   Currently in Pain? No/denies            [facility] PT Assessment - 08/12/15 0001    Assessment   Medical Diagnosis Lumbar spine pain   Referring Provider Arther Abbott, MD   Onset Date/Surgical Date --  6-57mo  Next MD Visit August 2017?   Prior Therapy 2012 low back   Precautions   Precautions None   Restrictions   Weight Bearing Restrictions No   Balance Screen   Has the patient fallen in the past 6 months No   Has the patient had a decrease in activity level because of a fear of falling?  No   Is the patient reluctant to leave their home because of a fear of falling?  No   Home Environment   Living Environment Private residence   Living Arrangements Alone   Type of [place]   Prior Function   Level of Independence Independent   Vocation Retired   Leisure reading, gardening, baking, traveling  Observation/Other Assessments   Observations standing: ASIS, illiac crest appear equal   Sensation   Light Touch Appears Intact   Posture/Postural Control   Posture/Postural Control Postural limitations   Postural Limitations Rounded Shoulders;Forward head   AROM   Lumbar Flexion WNL pain free  no pain   Lumbar Extension 25% limited, pain free   Lumbar - Right Side Bend below knee jt line, pain free   Lumbar - Left Side Bend below knee jt, pain free    Lumbar - Right Rotation 25% limited, pain free   Lumbar - Left Rotation 25% limited, pain free   Strength   Right Hip Flexion 4+/5   Right Hip Extension 4-/5   Right Hip ABduction 4+/5   Left Hip Flexion 4+/5   Left Hip Extension 4-/5   Left Hip ABduction 4+/5   Right Knee Flexion 5/5   Right Knee Extension 5/5   Left Knee Flexion 5/5   Left Knee Extension 5/5   Right Ankle  Dorsiflexion 5/5   Left Ankle Dorsiflexion 5/5   Flexibility   Soft Tissue Assessment /Muscle Length yes   Quadriceps symmetrical, (+) ely's   Piriformis WNL, pain free   Palpation   Palpation comment minimal tenderness noted with palpation along piriformis and lumbar paraspinals   Special Tests    Special Tests --   Lumbar Tests --   Slump test   Findings --   Prone Knee Bend Test   Findings --   Straight Leg Raise   Findings --   Bed Mobility   Bed Mobility --  poor technique with sit to/from supine using sit up form   Transfers   Five time sit to stand comments  12.7 without UE   6 minute walk test results    Endurance additional comments 3 MWT: 500 ft no AD                     [facility] Adult PT Treatment/Exercise - 08/12/15 0001    Therapeutic Activites    Therapeutic Activities Lifting   Lifting 10# box from floor with verbal cues and demonstration of correct technique, x10 reps   Lumbar Exercises: Stretches   Sports administrator 1 rep;30 seconds   Quad Stretch Limitations prone   Piriformis Stretch 1 rep;30 seconds   Piriformis Stretch Limitations supine   Knee/Hip Exercises: Machines for Strengthening   Total Gym Leg Press x10 reps   Knee/Hip Exercises: Standing   Functional Squat 1 set;10 reps  controlled lower                PT Education - 08/12/15 1737    Education provided Yes   Education Details goals/progress made; ending POC due to goals met and pleased with progress; updated/reviewed HEP; reviewed technique with machines and encouraged her to use equipment at [facility] with guidance from employee on correct setup/use   Person(s) Educated Patient   Methods Explanation;Demonstration;Handout   Comprehension Verbalized understanding;Returned demonstration          PT Short Term Goals - 08/12/15 0954    PT SHORT TERM GOAL #1   Title Pt will demo consistenct and independence with updated HEP   Time 2   Period Weeks   Status Achieved   PT  SHORT TERM GOAL #2   Title Pt will report decrased max pain report <5/10 to improve her tolerance with daily activity.   Baseline 3/10 pain    Time 3   Period Weeks  Status Achieved   PT SHORT TERM GOAL #3   Title Pt will demo improved lumbar AROM to WNL to imrpove her ability to to perform house chores.    Baseline extension and rotation remain limited   Time 3   Period Weeks   Status Partially Met   PT SHORT TERM GOAL #4   Title Pt will demo full understanding of log roll technique with correct performance during her session without cues from therapist.   Time 2   Period Weeks   Status Achieved           PT Long Term Goals - 08/12/15 0955    PT LONG TERM GOAL #1   Title Pt will demo improved BLE strength to atleast 4/5 MMT to improve her safety with functional mobility   Time 6   Period Weeks   Status Achieved   PT LONG TERM GOAL #2   Title pt will demo safe lifting mechanics evident by her ability to lift 10# object off the floor x5 trials without cuing from therapist.   Time 6   Period Weeks   Status Achieved   PT LONG TERM GOAL #3   Title Pt will demo decrease in pain report to no greater than 3/10 to improve her tolerance to ADLs and activity.   Time 6   Period Weeks   Status Achieved   PT LONG TERM GOAL #4   Title Pt will report improvement in back pain evident by her tolerance to household chores such as vacuuming, etc.    Time 6   Period Weeks   Status Achieved   PT LONG TERM GOAL #5   Title Pt will demo improved functional strength evident by her ability to perform 5x sit to stand in less than 14 sec.    Baseline 17 sec without UE; 08/12/15: 12.7 sec without UE   Time 6   Period Weeks   Status Achieved               Plan - 08/12/15 1743    Clinical Impression Statement Pt was reassessed this session having met her goals. She is very pleased with her progress made since beginning PT and has recently been very proactive about exercising and  consistently performing her HEP. She demonstrates improved BLE strength to atleast 4/5 MMT throughout, except hip extensor strength which should continue to improve with HEP adherence. She also demonstrates improved lumbar ROM which is pain free in all directions. Her functional strength has also improved evident by her performance of 5x sit to stand in less than 14 sec. Only remaining limitations are quad flexibility and lumbar AROM which are addressed in her HEP and she demonstrates good understanding and confidence to perform these at home. She is pleased with her current functional level and no longer requires skilled PT to address her remaining limitations. PT encouraged HEP adherence at home as well as continued participation in [facility] classes to improve fitness and well being.    Rehab Potential Good   PT Frequency 2x / week   PT Duration 6 weeks   PT Treatment/Interventions ADLs/Self Care Home Management;Cryotherapy;Moist Heat;Electrical Stimulation;Therapeutic activities;Functional mobility training;Stair training;Gait training;Therapeutic exercise;Balance training;Neuromuscular re-education;Patient/family education;Manual techniques;Passive range of motion   PT Next Visit Plan d/c   PT Home Exercise Plan final HEP: prone quad stretch, sit to stand with controlled sit, lower trunk rotation, seated knee to chest stretch, clamshells with blue TB, piriformis stretch   Recommended Other Services None  Consulted and Agree with Plan of Care Patient      Patient will benefit from skilled therapeutic intervention in order to improve the following deficits and impairments:  Decreased activity tolerance, Decreased endurance, Decreased safety awareness, Decreased range of motion, Decreased mobility, Decreased strength, Difficulty walking, Hypomobility, Impaired flexibility, Postural dysfunction, Pain, Improper body mechanics, Increased muscle spasms  Visit Diagnosis: Midline low back pain without  sciatica  Muscle weakness (generalized)  Other abnormalities of gait and mobility  Other symptoms and signs involving the musculoskeletal system       G-Codes - 26-Aug-2015 1742    Functional Assessment Tool Used Clinical judgement based on assessment of ROM, strength, functional testing   Functional Limitation Mobility: Walking and moving around   Mobility: Walking and Moving Around Current Status [phone]) At least 20 percent but less than 40 percent impaired, limited or restricted   Mobility: Walking and Moving Around Goal Status [phone]) At least 20 percent but less than 40 percent impaired, limited or restricted   Mobility: Walking and Moving Around Discharge Status [phone]) At least 20 percent but less than 40 percent impaired, limited or restricted     PHYSICAL THERAPY DISCHARGE SUMMARY  Visits from Start of Care: 8  Current functional level related to goals / functional outcomes: Pain free lumbar ROM, improved BLE strength to atleast 4/5 MMT (except hip extension), 5x sit to stand <14 sec. See clinical impression above for more information.   Remaining deficits: Back pain with prolonged activity, no greater than 2-3/10 on VAS. Limited quad flexibilty   Education / Equipment: Advanced home management program  Plan: Patient agrees to discharge.  Patient goals were met. Patient is being discharged due to being pleased with the current functional level.  ?????       Problem List Patient Active Problem List   Diagnosis Date Noted  . HTN (hypertension) 04/17/2014  . Dry eyes 04/14/2014  . Bleeding from the nose 04/01/2014  . Renal insufficiency 04/01/2014  . Rotator cuff arthropathy   . Muscle weakness (generalized) 05/26/2013  . Pain in joint, shoulder region 05/26/2013  . Rotator cuff syndrome 05/22/2013   5:52 PM,08-26-15 Elly Modena PT, DPT [facility] [place], [place], [address] Phone: [phone]   Fax:  [phone]  Name: JENETTE RAYSON MRN: [id number] Date of Birth: 1940-11-17

## 2015-08-16 ENCOUNTER — Encounter (HOSPITAL_COMMUNITY): Payer: Commercial Managed Care - HMO | Admitting: Physical Therapy

## 2015-08-19 ENCOUNTER — Encounter (HOSPITAL_COMMUNITY): Payer: Commercial Managed Care - HMO | Admitting: Physical Therapy

## 2015-08-31 DIAGNOSIS — I10 Essential (primary) hypertension: Secondary | ICD-10-CM | POA: Diagnosis not present

## 2015-08-31 DIAGNOSIS — T7849XA Other allergy, initial encounter: Secondary | ICD-10-CM | POA: Diagnosis not present

## 2015-08-31 DIAGNOSIS — E785 Hyperlipidemia, unspecified: Secondary | ICD-10-CM | POA: Diagnosis not present

## 2015-08-31 DIAGNOSIS — J449 Chronic obstructive pulmonary disease, unspecified: Secondary | ICD-10-CM | POA: Diagnosis not present

## 2015-08-31 DIAGNOSIS — E669 Obesity, unspecified: Secondary | ICD-10-CM | POA: Diagnosis not present

## 2015-08-31 DIAGNOSIS — M1990 Unspecified osteoarthritis, unspecified site: Secondary | ICD-10-CM | POA: Diagnosis not present

## 2015-09-20 ENCOUNTER — Ambulatory Visit: Payer: Commercial Managed Care - HMO | Admitting: Orthopedic Surgery

## 2015-10-05 ENCOUNTER — Ambulatory Visit (INDEPENDENT_AMBULATORY_CARE_PROVIDER_SITE_OTHER): Payer: Commercial Managed Care - HMO | Admitting: Orthopedic Surgery

## 2015-10-05 ENCOUNTER — Encounter: Payer: Self-pay | Admitting: Orthopedic Surgery

## 2015-10-05 DIAGNOSIS — M545 Low back pain, unspecified: Secondary | ICD-10-CM

## 2015-10-05 DIAGNOSIS — M79604 Pain in right leg: Secondary | ICD-10-CM | POA: Diagnosis not present

## 2015-10-05 DIAGNOSIS — R600 Localized edema: Secondary | ICD-10-CM | POA: Diagnosis not present

## 2015-10-05 MED ORDER — ACETAMINOPHEN-CODEINE #3 300-30 MG PO TABS: 1.0000 | ORAL_TABLET | Freq: Three times a day (TID) | ORAL | 2 refills | Status: DC | PRN

## 2015-10-05 NOTE — Progress Notes (Signed)
Follow-up lower back pain improved. Patient with physical therapy got good relief from her lower back pain. She has mild discomfort relieved by Tylenol 3 with occasional left leg radicular pain  Review of systems she is not having any bowel or bladder dysfunction no fever chills or night sweats  Examination of the spine shows slightly increased lumbar lordosis mild tenderness in the lower back. Decreased range of motion flexion extension. Left lower extremity strength normal hip knee and ankle stable skin normal without nodularity or ulceration pulses are good distally and sensation is normal on the left leg  PT notes indicate improvement  Recommend continue Tylenol 3 for pain follow-up in 6 months

## 2015-10-06 ENCOUNTER — Ambulatory Visit: Payer: Commercial Managed Care - HMO | Admitting: Podiatry

## 2015-10-13 ENCOUNTER — Ambulatory Visit (INDEPENDENT_AMBULATORY_CARE_PROVIDER_SITE_OTHER): Payer: Commercial Managed Care - HMO | Admitting: Podiatry

## 2015-10-13 ENCOUNTER — Encounter: Payer: Self-pay | Admitting: Podiatry

## 2015-10-13 ENCOUNTER — Ambulatory Visit (INDEPENDENT_AMBULATORY_CARE_PROVIDER_SITE_OTHER): Payer: Commercial Managed Care - HMO

## 2015-10-13 ENCOUNTER — Other Ambulatory Visit: Payer: Self-pay | Admitting: Podiatry

## 2015-10-13 VITALS — BP 121/69 | HR 65 | Resp 18

## 2015-10-13 DIAGNOSIS — R52 Pain, unspecified: Secondary | ICD-10-CM

## 2015-10-13 DIAGNOSIS — S93602A Unspecified sprain of left foot, initial encounter: Secondary | ICD-10-CM

## 2015-10-13 NOTE — Progress Notes (Signed)
   Subjective:    Patient ID: Heather [name], female    DOB: 1941/01/16, 75 y.o.   MRN: [id number]  HPI     This patient presents today complaining of approximately 2 week history of a painful left foot without any history of direct injury. Patient said that initially she did have some swelling which she self treated with over-the-counter Tylenol, existing pain medication he denies and using existing walker. She says that after several days the symptoms significantly have improved and now she only has some residual tenderness in the left foot. She states that she's been cardiovascular exercise for approximately 2 months 3 times a week up to 1 hour per session. This is a significant increase in her physical activity. He says he is a borderline diabetic and denies taking any medication. She denies any history of foot ulceration, claudication or amputation.    Review of Systems  All other systems reviewed and are negative.      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 Transfers medial drifting of lateral digits right HAV left Manual motor testing: Dorsi flexion, plantar flexion, inversion, eversion 5/5 bilaterally There is no pain or crepitus on range of motion ankle, subtalar, midtarsal joints bilaterally Mild palpable tenderness dorsal left foot without any palpable lesions  X-ray examination weightbearing left foot dated 10/13/2015  Intact bony  structure without a fracture and/or dislocation Decreased bone densities in all views HAV Decrease joint space first MPJ Hammertoe second Posterior and inferior calcaneal spurs  Radiographic impression: No acute bony abnormality noted weightbearing x-ray left foot dated 10/13/2015      Assessment & Plan:   Assessment: Satisfactory neurovascular status Possible resolving sprain left foot from cardiovascular exercise program  Plan: I reviewed the results of the examination and x-ray with patient in detail. I informed that she has satisfactory neurovascular status. The generalized foot pain is resolving and possibly related to her exercise program. I recommended she adjust her activity to her tolerance, wear supportive shoes and return if she has any future concerns

## 2015-10-13 NOTE — Patient Instructions (Signed)
Today your examination demonstrated adequate circulation and feeling in your left foot. The foot pain left is improved with relative rest. There is a history of cardiovascular exercise the last several months that may have caused this irritation the left foot. Adjust her activity to your tolerance and if the foot pain reoccurs present for further reevaluation  Foot Sprain A foot sprain is an injury to one of the strong bands of tissue (ligaments) that connect and support the many bones in your feet. The ligament can be stretched too much or it can tear. A tear can be either partial or complete. The severity of the sprain depends on how much of the ligament was damaged or torn. CAUSES A foot sprain is usually caused by suddenly twisting or pivoting your foot. RISK FACTORS This injury is more likely to occur in people who:  Play a sport, such as basketball or football.  Exercise or play a sport without warming up.  Start a new workout or sport.  Suddenly increase how long or hard they exercise or play a sport. SYMPTOMS Symptoms of this condition start soon after an injury and include:  Pain, especially in the arch of the foot.  Bruising.  Swelling.  Inability to walk or use the foot to support body weight. DIAGNOSIS This condition is diagnosed with a medical history and physical exam. You may also have imaging tests, such as:  X-rays to make sure there are no broken bones (fractures).  MRI to see if the ligament has torn. TREATMENT Treatment varies depending on the severity of your sprain. Mild sprains can be treated with rest, ice, compression, and elevation (RICE). If your ligament is overstretched or partially torn, treatment usually involves keeping your foot in a fixed position (immobilization) for a period of time. To help you do this, your health care provider will apply a bandage, splint, or walking boot to keep your foot from moving until it heals. You may also be advised to  use crutches or a scooter for a few weeks to avoid bearing weight on your foot while it is healing. If your ligament is fully torn, you may need surgery to reconnect the ligament to the bone. After surgery, a cast or splint will be applied and will need to stay on your foot while it heals. Your health care provider may also suggest exercises or physical therapy to strengthen your foot. HOME CARE INSTRUCTIONS If You Have a Bandage, Splint, or Walking Boot:  Wear it as directed by your health care provider. Remove it only as directed by your health care provider.  Loosen the bandage, splint, or walking boot if your toes become numb and tingle, or if they turn cold and blue. Bathing  If your health care provider approves bathing and showering, cover the bandage or splint with a watertight plastic bag to protect it from water. Do not let the bandage or splint get wet. Managing Pain, Stiffness, and Swelling   If directed, apply ice to the injured area:  Put ice in a plastic bag.  Place a towel between your skin and the bag.  Leave the ice on for 20 minutes, 2-3 times per day.  Move your toes often to avoid stiffness and to lessen swelling.  Raise (elevate) the injured area above the level of your heart while you are sitting or lying down. Driving  Do not drive or operate heavy machinery while taking pain medicine.  Do not drive while wearing a bandage, splint, or walking  boot on a foot that you use for driving. Activity  Rest as directed by your health care provider.  Do not use the injured foot to support your body weight until your health care provider says that you can. Use crutches or other supportive devices as directed by your health care provider.  Ask your health care provider what activities are safe for you. Gradually increase how much and how far you walk until your health care provider says it is safe to return to full activity.  Do any exercise or physical therapy as  directed by your health care provider. General Instructions  If a splint was applied, do not put pressure on any part of it until it is fully hardened. This may take several hours.  Take medicines only as directed by your health care provider. These include over-the-counter medicines and prescription medicines.  Keep all follow-up visits as directed by your health care provider. This is important.  When you can walk without pain, wear supportive shoes that have stiff soles. Do not wear flip-flops, and do not walk barefoot. SEEK MEDICAL CARE IF:  Your pain is not controlled with medicine.  Your bruising or swelling gets worse or does not get better with treatment.  Your splint or walking boot is damaged. SEEK IMMEDIATE MEDICAL CARE IF:  Your foot is numb or blue.  Your foot feels colder than normal.   This information is not intended to replace advice given to you by your health care provider. Make sure you discuss any questions you have with your health care provider.   Document Released: 08/12/2001 Document Revised: 07/07/2014 Document Reviewed: 12/24/2013 Elsevier Interactive Patient Education [employer]

## 2015-11-05 DIAGNOSIS — J449 Chronic obstructive pulmonary disease, unspecified: Secondary | ICD-10-CM | POA: Diagnosis not present

## 2015-11-05 DIAGNOSIS — I10 Essential (primary) hypertension: Secondary | ICD-10-CM | POA: Diagnosis not present

## 2015-12-06 DIAGNOSIS — J449 Chronic obstructive pulmonary disease, unspecified: Secondary | ICD-10-CM | POA: Diagnosis not present

## 2015-12-06 DIAGNOSIS — I10 Essential (primary) hypertension: Secondary | ICD-10-CM | POA: Diagnosis not present

## 2015-12-15 DIAGNOSIS — I10 Essential (primary) hypertension: Secondary | ICD-10-CM | POA: Diagnosis not present

## 2015-12-15 DIAGNOSIS — H25813 Combined forms of age-related cataract, bilateral: Secondary | ICD-10-CM | POA: Diagnosis not present

## 2015-12-15 DIAGNOSIS — H5200 Hypermetropia, unspecified eye: Secondary | ICD-10-CM | POA: Diagnosis not present

## 2015-12-30 DIAGNOSIS — H25812 Combined forms of age-related cataract, left eye: Secondary | ICD-10-CM | POA: Diagnosis not present

## 2015-12-30 DIAGNOSIS — H25811 Combined forms of age-related cataract, right eye: Secondary | ICD-10-CM | POA: Diagnosis not present

## 2015-12-30 DIAGNOSIS — H1852 Epithelial (juvenile) corneal dystrophy: Secondary | ICD-10-CM | POA: Diagnosis not present

## 2015-12-30 DIAGNOSIS — H25813 Combined forms of age-related cataract, bilateral: Secondary | ICD-10-CM | POA: Diagnosis not present

## 2016-01-20 DIAGNOSIS — H2512 Age-related nuclear cataract, left eye: Secondary | ICD-10-CM | POA: Diagnosis not present

## 2016-01-20 DIAGNOSIS — H25812 Combined forms of age-related cataract, left eye: Secondary | ICD-10-CM | POA: Diagnosis not present

## 2016-01-25 ENCOUNTER — Other Ambulatory Visit (HOSPITAL_COMMUNITY): Payer: Self-pay | Admitting: Internal Medicine

## 2016-01-25 DIAGNOSIS — Z1231 Encounter for screening mammogram for malignant neoplasm of breast: Secondary | ICD-10-CM

## 2016-01-26 DIAGNOSIS — J449 Chronic obstructive pulmonary disease, unspecified: Secondary | ICD-10-CM | POA: Diagnosis not present

## 2016-01-26 DIAGNOSIS — Z23 Encounter for immunization: Secondary | ICD-10-CM | POA: Diagnosis not present

## 2016-01-26 DIAGNOSIS — I10 Essential (primary) hypertension: Secondary | ICD-10-CM | POA: Diagnosis not present

## 2016-01-26 DIAGNOSIS — E669 Obesity, unspecified: Secondary | ICD-10-CM | POA: Diagnosis not present

## 2016-01-26 DIAGNOSIS — E785 Hyperlipidemia, unspecified: Secondary | ICD-10-CM | POA: Diagnosis not present

## 2016-01-26 DIAGNOSIS — R739 Hyperglycemia, unspecified: Secondary | ICD-10-CM | POA: Diagnosis not present

## 2016-01-26 DIAGNOSIS — Z96643 Presence of artificial hip joint, bilateral: Secondary | ICD-10-CM | POA: Diagnosis not present

## 2016-01-26 DIAGNOSIS — M1990 Unspecified osteoarthritis, unspecified site: Secondary | ICD-10-CM | POA: Diagnosis not present

## 2016-01-26 DIAGNOSIS — Z0000 Encounter for general adult medical examination without abnormal findings: Secondary | ICD-10-CM | POA: Diagnosis not present

## 2016-02-17 DIAGNOSIS — H25811 Combined forms of age-related cataract, right eye: Secondary | ICD-10-CM | POA: Diagnosis not present

## 2016-02-17 DIAGNOSIS — H2511 Age-related nuclear cataract, right eye: Secondary | ICD-10-CM | POA: Diagnosis not present

## 2016-03-08 ENCOUNTER — Ambulatory Visit (HOSPITAL_COMMUNITY)
Admission: RE | Admit: 2016-03-08 | Discharge: 2016-03-08 | Disposition: A | Discharge status: Home / Self Care | Payer: Commercial Managed Care - HMO | Source: Ambulatory Visit | Attending: Internal Medicine | Admitting: Internal Medicine

## 2016-03-08 DIAGNOSIS — Z1231 Encounter for screening mammogram for malignant neoplasm of breast: Secondary | ICD-10-CM | POA: Diagnosis not present

## 2016-03-27 DIAGNOSIS — J449 Chronic obstructive pulmonary disease, unspecified: Secondary | ICD-10-CM | POA: Diagnosis not present

## 2016-03-27 DIAGNOSIS — I10 Essential (primary) hypertension: Secondary | ICD-10-CM | POA: Diagnosis not present

## 2016-04-05 DIAGNOSIS — J449 Chronic obstructive pulmonary disease, unspecified: Secondary | ICD-10-CM | POA: Diagnosis not present

## 2016-04-05 DIAGNOSIS — I10 Essential (primary) hypertension: Secondary | ICD-10-CM | POA: Diagnosis not present

## 2016-04-10 ENCOUNTER — Ambulatory Visit (INDEPENDENT_AMBULATORY_CARE_PROVIDER_SITE_OTHER): Payer: Medicare HMO | Admitting: Orthopedic Surgery

## 2016-04-10 ENCOUNTER — Encounter: Payer: Self-pay | Admitting: Orthopedic Surgery

## 2016-04-10 DIAGNOSIS — G8929 Other chronic pain: Secondary | ICD-10-CM

## 2016-04-10 DIAGNOSIS — M545 Low back pain: Secondary | ICD-10-CM

## 2016-04-10 NOTE — Progress Notes (Signed)
FOLLOW UP VISIT   Patient ID: Heather [name], female   DOB: 02-07-1941, 76 y.o.   MRN: [id number]  Chief Complaint  Patient presents with  . Follow-up    back pain    HPI [name] is a 76 y.o. female.   HPI  76 year old female with some chronic lower back pain with good relief with Tylenol 3 takes one every 8 as needed. She reports she still having some back pain but no leg pain.  Review of Systems Review of Systems  Constitutional: Negative for fever.  Gastrointestinal: Negative.   Genitourinary: Negative.   Neurological: Negative for weakness and numbness.       Physical Exam  Constitutional: She is oriented to person, place, and time. She appears well-developed and well-nourished. No distress.  Cardiovascular: Normal rate and intact distal pulses.   Neurological: She is alert and oriented to person, place, and time. She has normal reflexes. She exhibits normal muscle tone. Coordination normal.  Skin: Skin is warm and dry. No rash noted. She is not diaphoretic. No erythema. No pallor.  Psychiatric: She has a normal mood and affect. Her behavior is normal. Judgment and thought content normal.  Lower back tenderness with increased lumbar lordosis   MEDICAL DECISION MAKING  DATA x-ray was done on the previous visit in April 2017: 3 views lumbar spine 4 views were taken to get 3 adequate films  The complaint was back pain  The patient has 2 total hip replacements. The lumbar spine shows sclerosis on the spot film. 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  Impression spondylosis L-spine   DIAGNOSIS  Encounter Diagnosis  Name Primary?  . Chronic bilateral low back pain without sciatica Yes     PLAN(RISK)    Continue Tylenol 3 one every 8 #42 5 refills [place] controlled substance reporting system reviewed Follow-up in 6  months

## 2016-05-12 DIAGNOSIS — H5200 Hypermetropia, unspecified eye: Secondary | ICD-10-CM | POA: Diagnosis not present

## 2016-05-12 DIAGNOSIS — Z0100 Encounter for examination of eyes and vision without abnormal findings: Secondary | ICD-10-CM | POA: Diagnosis not present

## 2016-05-25 DIAGNOSIS — I10 Essential (primary) hypertension: Secondary | ICD-10-CM | POA: Diagnosis not present

## 2016-05-25 DIAGNOSIS — J449 Chronic obstructive pulmonary disease, unspecified: Secondary | ICD-10-CM | POA: Diagnosis not present

## 2016-06-07 ENCOUNTER — Telehealth: Payer: Self-pay | Admitting: Orthopedic Surgery

## 2016-06-07 NOTE — Telephone Encounter (Signed)
Routing to Dr. Harrison to advise 

## 2016-06-07 NOTE — Telephone Encounter (Signed)
Patient is asking if Dr Romeo Apple would prescribe something else for her pain. She states that the Acetaminophen-Codiene (Tylenol #3) 300-30 mg is not helping her pain.  Please advise

## 2016-06-07 NOTE — Telephone Encounter (Signed)
Add 400 mg ibuprofen 3 x a day  and use ben gay 3 x a day

## 2016-06-08 NOTE — Telephone Encounter (Signed)
Patient aware.

## 2016-06-25 DIAGNOSIS — J449 Chronic obstructive pulmonary disease, unspecified: Secondary | ICD-10-CM | POA: Diagnosis not present

## 2016-06-25 DIAGNOSIS — I10 Essential (primary) hypertension: Secondary | ICD-10-CM | POA: Diagnosis not present

## 2016-07-25 DIAGNOSIS — E785 Hyperlipidemia, unspecified: Secondary | ICD-10-CM | POA: Diagnosis not present

## 2016-07-25 DIAGNOSIS — M1990 Unspecified osteoarthritis, unspecified site: Secondary | ICD-10-CM | POA: Diagnosis not present

## 2016-07-25 DIAGNOSIS — J449 Chronic obstructive pulmonary disease, unspecified: Secondary | ICD-10-CM | POA: Diagnosis not present

## 2016-07-25 DIAGNOSIS — I10 Essential (primary) hypertension: Secondary | ICD-10-CM | POA: Diagnosis not present

## 2016-08-25 DIAGNOSIS — J449 Chronic obstructive pulmonary disease, unspecified: Secondary | ICD-10-CM | POA: Diagnosis not present

## 2016-08-25 DIAGNOSIS — I10 Essential (primary) hypertension: Secondary | ICD-10-CM | POA: Diagnosis not present

## 2016-09-04 ENCOUNTER — Telehealth: Payer: Self-pay | Admitting: Orthopedic Surgery

## 2016-09-04 NOTE — Telephone Encounter (Signed)
Please schedule appt

## 2016-09-04 NOTE — Telephone Encounter (Signed)
Patient called and requested a refill on Acetaminophen-Codeine 300-30 (Tylenol #3)  Qty 90  Sig: Take 1 tablet by mouth every 8 (eight) hours as needed for moderate pain.

## 2016-09-04 NOTE — Telephone Encounter (Signed)
ROUTING TO DR HARRISON FOR APPROVAL 

## 2016-09-04 NOTE — Telephone Encounter (Signed)
NEEDS TO BE SEEN FOR CHRONIC OPIOID THERAPY

## 2016-09-14 ENCOUNTER — Ambulatory Visit (HOSPITAL_COMMUNITY)
Admission: RE | Admit: 2016-09-14 | Discharge: 2016-09-14 | Disposition: A | Discharge status: Home / Self Care | Payer: Medicare HMO | Source: Ambulatory Visit | Attending: Internal Medicine | Admitting: Internal Medicine

## 2016-09-14 ENCOUNTER — Other Ambulatory Visit (HOSPITAL_COMMUNITY): Payer: Self-pay | Admitting: Internal Medicine

## 2016-09-14 DIAGNOSIS — M25531 Pain in right wrist: Secondary | ICD-10-CM | POA: Diagnosis not present

## 2016-09-20 ENCOUNTER — Ambulatory Visit (INDEPENDENT_AMBULATORY_CARE_PROVIDER_SITE_OTHER): Payer: Medicare HMO

## 2016-09-20 ENCOUNTER — Other Ambulatory Visit: Payer: Self-pay | Admitting: Podiatry

## 2016-09-20 ENCOUNTER — Ambulatory Visit (INDEPENDENT_AMBULATORY_CARE_PROVIDER_SITE_OTHER): Payer: Medicare HMO | Admitting: Podiatry

## 2016-09-20 ENCOUNTER — Encounter: Payer: Self-pay | Admitting: Podiatry

## 2016-09-20 VITALS — BP 113/58 | HR 59 | Resp 18

## 2016-09-20 DIAGNOSIS — M2031 Hallux varus (acquired), right foot: Secondary | ICD-10-CM

## 2016-09-20 DIAGNOSIS — R52 Pain, unspecified: Secondary | ICD-10-CM | POA: Diagnosis not present

## 2016-09-20 DIAGNOSIS — M6740 Ganglion, unspecified site: Secondary | ICD-10-CM | POA: Diagnosis not present

## 2016-09-20 NOTE — Patient Instructions (Signed)
The left great toe is any deformed position resulting in continuous friction and rub which limits your activity We discussed treatment options including surgical treatment, including amputation I injected cortisone into the soft tissue thickening on the right big toe to reduce some of the inflammation and dispensed a toe shoe I will reschedule you for follow-up consultation with Dr. Logan Bores

## 2016-09-20 NOTE — Progress Notes (Signed)
   Subjective:    Patient ID: Heather [name], female    DOB: 26-Jul-1940, 76 y.o.   MRN: [id number]  HPI    Review of Systems  Skin:       My right big toe is hurting after I wear shoes  All other systems reviewed and are negative.      Objective:   Physical Exam        Assessment & Plan:

## 2016-09-21 NOTE — Progress Notes (Signed)
Patient ID: Heather [name], female   DOB: 11/24/1940, 76 y.o.   MRN: [id number]   Subjective: This patient presents today complaining of increasing pain over the past 6-12 months localized to the right great toe area. She complains of intense pain requiring her to stop her activity and rest in order to proceed on. As a result patient has reduced her limit her activity level. She is to ride a variety shoes and finds that a soft shoe reduces slightly the monitor discomfort. The symptoms are reduced and relieved with rest and elevation removal shoes Patient is former smoker discontinue in 1989  Review of systems noted below  Objective: Orientated 3  Vascular: Bilateral peripheral pitting edema DP and PT pulses 2/4 bilaterally Capillary reflex within normal limits bilaterally  Neurological: Sensation to 10 g monofilament wire intact 9/9 bilaterally Vibratory sensation reactive bilaterally Ankle reflexes reactive bilaterally  Dermatological: No open skin lesions bilaterally Multiple well-healed move-in surgical scars noted in the dorsal aspect of right foot Well-healed surgical scar noted dorsal left foot   Musculoskeletal: Hallux varus right Palpable tenderness medial plantar right hallux, which duplicates patient's discomfort to palpation There is a well-organized fibrous lesion on the medial plantar aspect of the interphalangeal joint area of the right hallux, 10 mm x 10 mm Hallux varus right Medial transverse position at digits 2-5 right HAV left Manual motor testing dorsi flexion, plantar flexion, inversion, eversion 5/5 bilaterally  X-ray examination weightbearing right foot dated 09/20/2016 Intact bony structure of fracture or dislocation Increased soft tissue density medial hallux Hallux varus Medial transverse position of toes 1-5 Decreased bone density Evidence of bunionectomy Surgical resection head of proximal phalanx second and fifth toes  Radiographic impression  of weightbearing x-ray dated 09/20/2016 No acute bony abnormality noted Increased soft tissue density right hallux Previous surgical intervention Hallux varus deformity   Assessment: 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  Plan: I reviewed the results of the exam and x-ray with patient today. I made aware that the primary problem was the varus deformity of the right hallux resulting in chronic reactive friction and rub and most likely the cause of the painful area on the medial plantar aspect of the right foot. I discussed treatment options with patient including avoidance of shoe pressure, padding, local corticosteroid injection to reduce symptoms. Also discussed surgical intervention including repair of hallux varus or possible amputation. At this time patient prefers local corticosteroid injection and will return for reevaluation in approximately a month. The right hallux was prepped with alcohol and Betadine. 5 mg of Kenalog 10 mixed with 10 mg of plain Xylocaine and approximately 2.5 mg of plain Sensorcaine were injected into the medial plantar reactive fibrous tissue. The area was aspirated and no fluid was withdrawn. The soft tissue lesion did not reduce in size. Patient tolerated procedure without any difficulty. A gelcap foam toe protector was dispensed to wear over the right hallux  Reappoint 4 weeks with Dr. Logan Bores for further evaluation and discuss treatment options

## 2016-09-25 ENCOUNTER — Encounter: Payer: Self-pay | Admitting: Orthopedic Surgery

## 2016-09-25 ENCOUNTER — Ambulatory Visit (INDEPENDENT_AMBULATORY_CARE_PROVIDER_SITE_OTHER): Payer: Medicare HMO | Admitting: Orthopedic Surgery

## 2016-09-25 DIAGNOSIS — M47816 Spondylosis without myelopathy or radiculopathy, lumbar region: Secondary | ICD-10-CM | POA: Diagnosis not present

## 2016-09-25 DIAGNOSIS — M5136 Other intervertebral disc degeneration, lumbar region: Secondary | ICD-10-CM

## 2016-09-25 MED ORDER — ACETAMINOPHEN-CODEINE #3 300-30 MG PO TABS: 1.0000 | ORAL_TABLET | Freq: Three times a day (TID) | ORAL | 2 refills | Status: DC | PRN

## 2016-09-25 NOTE — Progress Notes (Signed)
This is a follow-up visit  This is a  76 year old female follow-up chronic back pain her lumbar spondylosis. Pain was controlled on Tylenol with codeine.  She comes in today saying pain is worse radiating to her right leg graft she is interested in epidural steroid injections  Prior to the, codeine she took a 6 month course of NSAIDs  Currently she has normal bowel bladder function  She is well-developed and nourished grooming and hygiene is normal  Lumbar spine  Tenderness right SI joint right buttock and lower segments L4-S1. She has painful range of motion flexion extension rotation  Right hip is stable hip flexures are normal and strength pulses are good but she has bilateral peripheral edema  Straight leg raise is positive  Encounter Diagnoses  Name Primary?  . DDD (degenerative disc disease), lumbar Yes  . Lumbar spondylosis     Recommend MRI to repair her for epidural steroid injections  Her x-ray including 3 views of the lumbar spine was done April 7 2 shows L5-S1 degenerative disc disease. Abnormal coronal plane alignment

## 2016-10-06 ENCOUNTER — Ambulatory Visit (HOSPITAL_COMMUNITY)
Admission: RE | Admit: 2016-10-06 | Discharge: 2016-10-06 | Disposition: A | Discharge status: Home / Self Care | Payer: Medicare HMO | Source: Ambulatory Visit | Attending: Orthopedic Surgery | Admitting: Orthopedic Surgery

## 2016-10-06 DIAGNOSIS — M48061 Spinal stenosis, lumbar region without neurogenic claudication: Secondary | ICD-10-CM | POA: Diagnosis not present

## 2016-10-06 DIAGNOSIS — M545 Low back pain: Secondary | ICD-10-CM | POA: Diagnosis not present

## 2016-10-06 DIAGNOSIS — Z96643 Presence of artificial hip joint, bilateral: Secondary | ICD-10-CM | POA: Diagnosis not present

## 2016-10-06 DIAGNOSIS — M8938 Hypertrophy of bone, other site: Secondary | ICD-10-CM | POA: Diagnosis not present

## 2016-10-06 DIAGNOSIS — M5136 Other intervertebral disc degeneration, lumbar region: Secondary | ICD-10-CM | POA: Insufficient documentation

## 2016-10-06 DIAGNOSIS — M1288 Other specific arthropathies, not elsewhere classified, other specified site: Secondary | ICD-10-CM | POA: Insufficient documentation

## 2016-10-06 DIAGNOSIS — M4316 Spondylolisthesis, lumbar region: Secondary | ICD-10-CM | POA: Insufficient documentation

## 2016-10-06 DIAGNOSIS — M51369 Other intervertebral disc degeneration, lumbar region without mention of lumbar back pain or lower extremity pain: Secondary | ICD-10-CM

## 2016-10-09 ENCOUNTER — Ambulatory Visit: Payer: Medicare HMO | Admitting: Orthopedic Surgery

## 2016-10-09 ENCOUNTER — Ambulatory Visit (INDEPENDENT_AMBULATORY_CARE_PROVIDER_SITE_OTHER): Payer: Medicare HMO | Admitting: Orthopedic Surgery

## 2016-10-09 DIAGNOSIS — G8929 Other chronic pain: Secondary | ICD-10-CM | POA: Diagnosis not present

## 2016-10-09 DIAGNOSIS — M5136 Other intervertebral disc degeneration, lumbar region: Secondary | ICD-10-CM | POA: Diagnosis not present

## 2016-10-09 DIAGNOSIS — M47816 Spondylosis without myelopathy or radiculopathy, lumbar region: Secondary | ICD-10-CM

## 2016-10-09 DIAGNOSIS — M545 Low back pain: Secondary | ICD-10-CM

## 2016-10-09 NOTE — Progress Notes (Signed)
Follow-up visit  Chief complaint is back pain  History the patient has a history of lower back pain and has been to MRI for evaluation after failure of conservative treatment  Review of Systems  Constitutional: Negative for chills and fever.  Respiratory: Negative for shortness of breath.   Cardiovascular: Negative for chest pain.  Musculoskeletal: Positive for back pain.     The report was read as  IMPRESSION: 1. 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 This cyst partially effaces the thecal sac and impinges on the exiting T12 nerve root. 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 Further description above. 2. Lower lumbar facet arthropathy with grade 1 anterolisthesis at L4-5 and L5-S1. 3. 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 4. Prior L5-S1 decompressive laminectomy with patent thecal sac. 5. Prominent iliopsoas atrophy bilaterally. Status post bilateral hip replacement.  My reading degenerative disc disease with bulging disc and facet hypertrophy of the L4-5 disc space causing right foraminal stenosis  Encounter Diagnoses  Name Primary?  . DDD (degenerative disc disease), lumbar Yes  . Lumbar spondylosis   . Chronic bilateral low back pain without sciatica     Plan After reviewing these findings with the patient's she is agreed with epidural series versus surgical referral  Follow-up in 2 months

## 2016-10-09 NOTE — Addendum Note (Signed)
Addended by: Adella Hare B on: 10/09/2016 11:22 AM   Modules accepted: Orders

## 2016-10-18 ENCOUNTER — Ambulatory Visit
Admission: RE | Admit: 2016-10-18 | Discharge: 2016-10-18 | Disposition: A | Discharge status: Home / Self Care | Payer: Medicare HMO | Source: Ambulatory Visit | Attending: Orthopedic Surgery | Admitting: Orthopedic Surgery

## 2016-10-18 DIAGNOSIS — M48061 Spinal stenosis, lumbar region without neurogenic claudication: Secondary | ICD-10-CM | POA: Diagnosis not present

## 2016-10-18 DIAGNOSIS — M47816 Spondylosis without myelopathy or radiculopathy, lumbar region: Secondary | ICD-10-CM

## 2016-10-18 MED ORDER — METHYLPREDNISOLONE ACETATE 40 MG/ML INJ SUSP (RADIOLOG
120.0000 mg | Freq: Once | INTRAMUSCULAR | Status: AC
  Administered 2016-10-18: 120 mg via EPIDURAL

## 2016-10-18 MED ORDER — IOPAMIDOL (ISOVUE-M 200) INJECTION 41%
1.0000 mL | Freq: Once | INTRAMUSCULAR | Status: AC
  Administered 2016-10-18: 1 mL via EPIDURAL

## 2016-10-18 NOTE — Discharge Instructions (Signed)

## 2016-10-23 ENCOUNTER — Ambulatory Visit: Payer: Medicare HMO | Admitting: Podiatry

## 2016-11-12 DIAGNOSIS — J449 Chronic obstructive pulmonary disease, unspecified: Secondary | ICD-10-CM | POA: Diagnosis not present

## 2016-11-12 DIAGNOSIS — I10 Essential (primary) hypertension: Secondary | ICD-10-CM | POA: Diagnosis not present

## 2016-12-11 ENCOUNTER — Ambulatory Visit (INDEPENDENT_AMBULATORY_CARE_PROVIDER_SITE_OTHER): Payer: Medicare HMO | Admitting: Orthopedic Surgery

## 2016-12-11 ENCOUNTER — Encounter: Payer: Self-pay | Admitting: Orthopedic Surgery

## 2016-12-11 VITALS — BP 118/60 | HR 60 | Ht 63.0 in | Wt 182.0 lb

## 2016-12-11 DIAGNOSIS — M5136 Other intervertebral disc degeneration, lumbar region: Secondary | ICD-10-CM

## 2016-12-11 DIAGNOSIS — G8929 Other chronic pain: Secondary | ICD-10-CM | POA: Diagnosis not present

## 2016-12-11 DIAGNOSIS — M545 Low back pain: Secondary | ICD-10-CM | POA: Diagnosis not present

## 2016-12-11 DIAGNOSIS — M47816 Spondylosis without myelopathy or radiculopathy, lumbar region: Secondary | ICD-10-CM

## 2016-12-11 MED ORDER — ACETAMINOPHEN-CODEINE #3 300-30 MG PO TABS: 1.0000 | ORAL_TABLET | Freq: Three times a day (TID) | ORAL | 2 refills | Status: DC | PRN

## 2016-12-11 MED ORDER — MELOXICAM 7.5 MG PO TABS: 7.5000 mg | ORAL_TABLET | Freq: Every day | ORAL | 5 refills | Status: DC

## 2016-12-11 NOTE — Progress Notes (Signed)
Routine follow-up  Status post injections epidural for back pain  Chief Complaint  Patient presents with  . Follow-up    2 month recheck on ESI injection.    History she 76 years old she's had epidurals for spondylosis lumbar spine she did get some relief she says she can tell her hip is better. She would like her Tylenol with Codeine refilled  She gets good relief from that  Review of systems she is complaining of some popping in her neck without radicular symptoms  Her exam today shows that her neck is nontender she has decreased range of motion rotating right to leftnormal grip strength in both upper extremities normal sensation in both upper extremities good pulses in both upper extremities no instability in either wrist  I told her if that becomes an issue we can certainly workup  I did place her on some anti-inflammatory to decrease her opioid load  Follow-up as needed  Encounter Diagnoses  Name Primary?  . DDD (degenerative disc disease), lumbar Yes  . Lumbar spondylosis   . Chronic bilateral low back pain without sciatica     Meds ordered this encounter  Medications  . meloxicam (MOBIC) 7.5 MG tablet    Sig: Take 1 tablet (7.5 mg total) by mouth daily.    Dispense:  30 tablet    Refill:  5  . acetaminophen-codeine (TYLENOL #3) 300-30 MG tablet    Sig: Take 1 tablet by mouth every 8 (eight) hours as needed for moderate pain.    Dispense:  90 tablet    Refill:  2

## 2016-12-14 DIAGNOSIS — E785 Hyperlipidemia, unspecified: Secondary | ICD-10-CM | POA: Diagnosis not present

## 2016-12-14 DIAGNOSIS — Z23 Encounter for immunization: Secondary | ICD-10-CM | POA: Diagnosis not present

## 2016-12-14 DIAGNOSIS — I10 Essential (primary) hypertension: Secondary | ICD-10-CM | POA: Diagnosis not present

## 2016-12-14 DIAGNOSIS — J449 Chronic obstructive pulmonary disease, unspecified: Secondary | ICD-10-CM | POA: Diagnosis not present

## 2016-12-14 DIAGNOSIS — F339 Major depressive disorder, recurrent, unspecified: Secondary | ICD-10-CM | POA: Diagnosis not present

## 2017-01-08 ENCOUNTER — Telehealth: Payer: Self-pay | Admitting: Orthopedic Surgery

## 2017-01-08 DIAGNOSIS — G8929 Other chronic pain: Secondary | ICD-10-CM

## 2017-01-08 DIAGNOSIS — M545 Low back pain: Principal | ICD-10-CM

## 2017-01-08 NOTE — Telephone Encounter (Signed)
Patient called stating that she is ready to schedule the surgery for her back. I explained to her that Dr. Romeo Apple doesn't do back surgery. She stated maybe he was going to refer her to someone else. I also explained that someone would be contacting her.

## 2017-01-08 NOTE — Telephone Encounter (Signed)
You ordered ESI's on her, and she is calling now ready for surgery, ok to refer her out to her previous surgeon for her back?

## 2017-01-08 NOTE — Telephone Encounter (Signed)
yes

## 2017-01-09 NOTE — Telephone Encounter (Signed)
Called pt to discuss, she was in [place] when she had previous surgery. I have asked her to get the records/ have put in referral to Neurosurgeon

## 2017-02-09 ENCOUNTER — Other Ambulatory Visit (HOSPITAL_COMMUNITY): Payer: Self-pay | Admitting: Internal Medicine

## 2017-02-09 DIAGNOSIS — Z1231 Encounter for screening mammogram for malignant neoplasm of breast: Secondary | ICD-10-CM

## 2017-02-22 DIAGNOSIS — I10 Essential (primary) hypertension: Secondary | ICD-10-CM | POA: Diagnosis not present

## 2017-02-22 DIAGNOSIS — Z1389 Encounter for screening for other disorder: Secondary | ICD-10-CM | POA: Diagnosis not present

## 2017-02-22 DIAGNOSIS — I110 Hypertensive heart disease with heart failure: Secondary | ICD-10-CM | POA: Diagnosis not present

## 2017-02-22 DIAGNOSIS — E785 Hyperlipidemia, unspecified: Secondary | ICD-10-CM | POA: Diagnosis not present

## 2017-02-22 DIAGNOSIS — K219 Gastro-esophageal reflux disease without esophagitis: Secondary | ICD-10-CM | POA: Diagnosis not present

## 2017-02-22 DIAGNOSIS — Z0000 Encounter for general adult medical examination without abnormal findings: Secondary | ICD-10-CM | POA: Diagnosis not present

## 2017-02-22 DIAGNOSIS — R739 Hyperglycemia, unspecified: Secondary | ICD-10-CM | POA: Diagnosis not present

## 2017-02-22 DIAGNOSIS — J449 Chronic obstructive pulmonary disease, unspecified: Secondary | ICD-10-CM | POA: Diagnosis not present

## 2017-02-22 DIAGNOSIS — E669 Obesity, unspecified: Secondary | ICD-10-CM | POA: Diagnosis not present

## 2017-02-22 DIAGNOSIS — M1990 Unspecified osteoarthritis, unspecified site: Secondary | ICD-10-CM | POA: Diagnosis not present

## 2017-03-09 ENCOUNTER — Ambulatory Visit (HOSPITAL_COMMUNITY)
Admission: RE | Admit: 2017-03-09 | Discharge: 2017-03-09 | Disposition: A | Discharge status: Home / Self Care | Payer: Medicare HMO | Source: Ambulatory Visit | Attending: Internal Medicine | Admitting: Internal Medicine

## 2017-03-09 DIAGNOSIS — Z1231 Encounter for screening mammogram for malignant neoplasm of breast: Secondary | ICD-10-CM | POA: Diagnosis not present

## 2017-03-20 ENCOUNTER — Other Ambulatory Visit: Payer: Self-pay | Admitting: Orthopedic Surgery

## 2017-03-20 DIAGNOSIS — M5136 Other intervertebral disc degeneration, lumbar region: Secondary | ICD-10-CM

## 2017-03-20 DIAGNOSIS — M47816 Spondylosis without myelopathy or radiculopathy, lumbar region: Secondary | ICD-10-CM

## 2017-03-20 DIAGNOSIS — G8929 Other chronic pain: Secondary | ICD-10-CM

## 2017-03-20 DIAGNOSIS — M545 Low back pain: Secondary | ICD-10-CM

## 2017-03-25 DIAGNOSIS — J449 Chronic obstructive pulmonary disease, unspecified: Secondary | ICD-10-CM | POA: Diagnosis not present

## 2017-03-25 DIAGNOSIS — I10 Essential (primary) hypertension: Secondary | ICD-10-CM | POA: Diagnosis not present

## 2017-04-05 DIAGNOSIS — I10 Essential (primary) hypertension: Secondary | ICD-10-CM | POA: Diagnosis not present

## 2017-04-05 DIAGNOSIS — Z6833 Body mass index (BMI) 33.0-33.9, adult: Secondary | ICD-10-CM | POA: Diagnosis not present

## 2017-04-05 DIAGNOSIS — M4316 Spondylolisthesis, lumbar region: Secondary | ICD-10-CM | POA: Diagnosis not present

## 2017-04-20 DIAGNOSIS — B353 Tinea pedis: Secondary | ICD-10-CM | POA: Diagnosis not present

## 2017-04-20 DIAGNOSIS — M2011 Hallux valgus (acquired), right foot: Secondary | ICD-10-CM | POA: Diagnosis not present

## 2017-04-20 DIAGNOSIS — B351 Tinea unguium: Secondary | ICD-10-CM | POA: Diagnosis not present

## 2017-04-20 DIAGNOSIS — I739 Peripheral vascular disease, unspecified: Secondary | ICD-10-CM | POA: Diagnosis not present

## 2017-04-20 DIAGNOSIS — M19071 Primary osteoarthritis, right ankle and foot: Secondary | ICD-10-CM | POA: Diagnosis not present

## 2017-04-20 DIAGNOSIS — L84 Corns and callosities: Secondary | ICD-10-CM | POA: Diagnosis not present

## 2017-04-23 DIAGNOSIS — M4726 Other spondylosis with radiculopathy, lumbar region: Secondary | ICD-10-CM | POA: Diagnosis not present

## 2017-04-23 DIAGNOSIS — M5416 Radiculopathy, lumbar region: Secondary | ICD-10-CM | POA: Diagnosis not present

## 2017-04-23 DIAGNOSIS — M5136 Other intervertebral disc degeneration, lumbar region: Secondary | ICD-10-CM | POA: Diagnosis not present

## 2017-04-23 DIAGNOSIS — M48061 Spinal stenosis, lumbar region without neurogenic claudication: Secondary | ICD-10-CM | POA: Diagnosis not present

## 2017-04-25 DIAGNOSIS — J449 Chronic obstructive pulmonary disease, unspecified: Secondary | ICD-10-CM | POA: Diagnosis not present

## 2017-04-25 DIAGNOSIS — I10 Essential (primary) hypertension: Secondary | ICD-10-CM | POA: Diagnosis not present

## 2017-05-04 ENCOUNTER — Other Ambulatory Visit: Payer: Self-pay | Admitting: Orthopedic Surgery

## 2017-05-23 DIAGNOSIS — J449 Chronic obstructive pulmonary disease, unspecified: Secondary | ICD-10-CM | POA: Diagnosis not present

## 2017-05-23 DIAGNOSIS — I10 Essential (primary) hypertension: Secondary | ICD-10-CM | POA: Diagnosis not present

## 2017-06-13 DIAGNOSIS — I10 Essential (primary) hypertension: Secondary | ICD-10-CM | POA: Diagnosis not present

## 2017-06-13 DIAGNOSIS — J449 Chronic obstructive pulmonary disease, unspecified: Secondary | ICD-10-CM | POA: Diagnosis not present

## 2017-06-13 DIAGNOSIS — K219 Gastro-esophageal reflux disease without esophagitis: Secondary | ICD-10-CM | POA: Diagnosis not present

## 2017-06-13 DIAGNOSIS — J309 Allergic rhinitis, unspecified: Secondary | ICD-10-CM | POA: Diagnosis not present

## 2017-06-23 ENCOUNTER — Emergency Department (HOSPITAL_COMMUNITY)
Admission: EM | Admit: 2017-06-23 | Discharge: 2017-06-23 | Disposition: A | Discharge status: Home / Self Care | Payer: Medicare HMO | Attending: Emergency Medicine | Admitting: Emergency Medicine

## 2017-06-23 ENCOUNTER — Encounter (HOSPITAL_COMMUNITY): Payer: Self-pay | Admitting: Emergency Medicine

## 2017-06-23 ENCOUNTER — Emergency Department (HOSPITAL_COMMUNITY): Payer: Medicare HMO

## 2017-06-23 ENCOUNTER — Other Ambulatory Visit: Payer: Self-pay

## 2017-06-23 DIAGNOSIS — M109 Gout, unspecified: Secondary | ICD-10-CM | POA: Insufficient documentation

## 2017-06-23 DIAGNOSIS — M7989 Other specified soft tissue disorders: Secondary | ICD-10-CM | POA: Diagnosis not present

## 2017-06-23 DIAGNOSIS — I10 Essential (primary) hypertension: Secondary | ICD-10-CM | POA: Diagnosis not present

## 2017-06-23 DIAGNOSIS — Z87891 Personal history of nicotine dependence: Secondary | ICD-10-CM | POA: Diagnosis not present

## 2017-06-23 DIAGNOSIS — M25572 Pain in left ankle and joints of left foot: Secondary | ICD-10-CM | POA: Diagnosis not present

## 2017-06-23 DIAGNOSIS — Z7982 Long term (current) use of aspirin: Secondary | ICD-10-CM | POA: Diagnosis not present

## 2017-06-23 DIAGNOSIS — Z79899 Other long term (current) drug therapy: Secondary | ICD-10-CM | POA: Diagnosis not present

## 2017-06-23 DIAGNOSIS — J449 Chronic obstructive pulmonary disease, unspecified: Secondary | ICD-10-CM | POA: Insufficient documentation

## 2017-06-23 DIAGNOSIS — R600 Localized edema: Secondary | ICD-10-CM | POA: Diagnosis not present

## 2017-06-23 LAB — CBC WITH DIFFERENTIAL/PLATELET
Basophils Absolute: 0.1 10*3/uL (ref 0.0–0.1)
Basophils Relative: 1 %
EOS ABS: 0.1 10*3/uL (ref 0.0–0.7)
EOS PCT: 2 %
HCT: 41.1 % (ref 36.0–46.0)
Hemoglobin: 13 g/dL (ref 12.0–15.0)
Lymphocytes Relative: 19 %
Lymphs Abs: 1.3 10*3/uL (ref 0.7–4.0)
MCH: 30 pg (ref 26.0–34.0)
MCHC: 31.6 g/dL (ref 30.0–36.0)
MCV: 94.9 fL (ref 78.0–100.0)
MONO ABS: 0.9 10*3/uL (ref 0.1–1.0)
MONOS PCT: 13 %
Neutro Abs: 4.5 10*3/uL (ref 1.7–7.7)
Neutrophils Relative %: 65 %
Platelets: 209 10*3/uL (ref 150–400)
RBC: 4.33 MIL/uL (ref 3.87–5.11)
RDW: 14.2 % (ref 11.5–15.5)
WBC: 6.9 10*3/uL (ref 4.0–10.5)

## 2017-06-23 LAB — BASIC METABOLIC PANEL
Anion gap: 13 (ref 5–15)
BUN: 15 mg/dL (ref 6–20)
CHLORIDE: 101 mmol/L (ref 101–111)
CO2: 25 mmol/L (ref 22–32)
CREATININE: 0.83 mg/dL (ref 0.44–1.00)
Calcium: 9.4 mg/dL (ref 8.9–10.3)
GFR calc Af Amer: >60 mL/min (ref >60)
GFR calc non Af Amer: >60 mL/min (ref >60)
Glucose, Bld: 98 mg/dL (ref 65–99)
Potassium: 3.8 mmol/L (ref 3.5–5.1)
SODIUM: 139 mmol/L (ref 135–145)

## 2017-06-23 MED ORDER — COLCHICINE 0.6 MG PO TABS: 0.6000 mg | ORAL_TABLET | Freq: Every day | ORAL | 0 refills | Status: DC

## 2017-06-23 NOTE — ED Provider Notes (Signed)
[facility] EMERGENCY DEPARTMENT Provider Note   CSN: [id number] Arrival date & time: 06/23/17  1056     History   Chief Complaint Chief Complaint  Patient presents with  . Ankle Pain    HPI Heather [name] is a 77 y.o. female.  HPI Patient presents with pain and swelling her left ankle.  Has had for the last couple days.  Recently started on amlodipine.  States she has chronic swelling in both of her lower legs comes and goes to the left typically being worse on the right.  Now has more pain in the ankle on the left which is unusual for her.  No chest pain or trouble breathing.  No fevers.  No trauma.  Does have history of gout. Past Medical History:  Diagnosis Date  . Anemia   . Anxiety   . Arthritis   . COPD (chronic obstructive pulmonary disease) (HCC)   . Depression   . GERD (gastroesophageal reflux disease)   . Glaucoma   . Gout   . HTN (hypertension)     Patient Active Problem List   Diagnosis Date Noted  . HTN (hypertension) 04/17/2014  . Dry eyes 04/14/2014  . Bleeding from the nose 04/01/2014  . Renal insufficiency 04/01/2014  . Rotator cuff arthropathy   . Muscle weakness (generalized) 05/26/2013  . Pain in joint, shoulder region 05/26/2013  . Rotator cuff syndrome 05/22/2013    Past Surgical History:  Procedure Laterality Date  . ABDOMINAL HYSTERECTOMY    . BACK SURGERY     lumbar-disc  . BUNIONECTOMY Bilateral   . FOOT SURGERY Left    repair of "kissing Cousins"  . JOINT REPLACEMENT Bilateral    hip   . JOINT REPLACEMENT Right    knee  . SHOULDER ARTHROSCOPY Right   . SHOULDER HEMI-ARTHROPLASTY Left 03/25/2014   Procedure: LEFT SHOULDER HEMI-ARTHROPLASTY;  Surgeon: Vickki Hearing, MD;  Location: [facility];  Service: Orthopedics;  Laterality: Left;     OB History   None      Home Medications    Prior to Admission medications   Medication Sig Start Date End Date Taking? Authorizing Provider  acetaminophen-codeine (TYLENOL #3) 300-30  MG tablet Take 1 tablet by mouth every 8 (eight) hours as needed for moderate pain. 05/08/17  Yes Vickki Hearing, MD  amLODipine (NORVASC) 5 MG tablet Take 1 tablet by mouth daily. 05/18/17  Yes [provider]  aspirin EC 81 MG tablet Take 81 mg by mouth every morning.   Yes [provider]  Calcium-Magnesium-Zinc [phone] MG TABS Take 1 capsule by mouth daily.   Yes [provider]  cetirizine (ZYRTEC) 10 MG tablet Take 10 mg by mouth every morning.   Yes [provider]  cholecalciferol (VITAMIN D) 1000 UNITS tablet Take 1,000 Units by mouth at bedtime.   Yes [provider]  citalopram (CELEXA) 20 MG tablet Take 20 mg by mouth every morning.   Yes [provider]  furosemide (LASIX) 40 MG tablet Take 20 mg by mouth daily.    Yes [provider]  INCRUSE ELLIPTA 62.5 MCG/INH AEPB Inhale 1 puff into the lungs daily.  04/19/17  Yes [provider]  lisinopril (PRINIVIL,ZESTRIL) 20 MG tablet Take 20 mg by mouth daily.   Yes [provider]  losartan (COZAAR) 100 MG tablet Take 1 tablet by mouth daily. 06/14/17  Yes [provider]  metoprolol (LOPRESSOR) 100 MG tablet Take 1 tablet by mouth daily. 01/24/14  Yes [provider]  omeprazole (PRILOSEC) 20 MG capsule Take 20 mg by mouth daily.   Yes [provider]  potassium chloride (MICRO-K) 10 MEQ CR capsule Take 1 capsule by mouth 2 (two) times daily. 02/03/14  Yes [provider]  PROAIR HFA 108 (90 BASE) MCG/ACT inhaler Inhale 2 puffs into the lungs every 6 (six) hours as needed for wheezing or shortness of breath.  02/15/14  Yes [provider]  zolpidem (AMBIEN) 10 MG tablet Take 10 mg by mouth at bedtime as needed for sleep.   Yes [provider]  colchicine 0.6 MG tablet Take 1 tablet (0.6 mg total) by mouth daily. 06/23/17   Benjiman Core, MD  nitroGLYCERIN (NITROSTAT) 0.4 MG SL tablet Place 0.4 mg under  the tongue every 5 (five) minutes as needed for chest pain.    [provider]    Family History No family history on file.  Social History Social History   Tobacco Use  . Smoking status: Former Smoker    Packs/day: 1.00    Years: 40.00    Pack years: 40.00    Types: Cigarettes    Last attempt to quit: 03/20/1985    Years since quitting: 32.2  . Smokeless tobacco: Never Used  Substance Use Topics  . Alcohol use: No  . Drug use: No     Allergies   Lovastatin   Review of Systems Review of Systems  Constitutional: Negative for appetite change and fever.  HENT: Negative for congestion.   Respiratory: Negative for shortness of breath.   Cardiovascular: Positive for leg swelling.  Gastrointestinal: Negative for abdominal pain.  Genitourinary: Negative for flank pain.  Musculoskeletal:       Left ankle pain.  Skin: Negative for rash.  Neurological: Negative for numbness.  Hematological: Negative for adenopathy.  Psychiatric/Behavioral: Negative for confusion.     Physical Exam Updated Vital Signs BP (!) 151/77   Pulse 73   Temp 98.6 F (37 C) (Oral)   Resp 16   Ht 5\' 3"  (1.6 m)   Wt 85.3 kg (188 lb)   SpO2 93%   BMI 33.30 kg/m   Physical Exam  Constitutional: She appears well-developed.  HENT:  Head: Normocephalic.  Eyes: EOM are normal.  Neck: Neck supple.  Cardiovascular: Normal rate.  Pulmonary/Chest: She has no rales.  Musculoskeletal: She exhibits edema.  Pitting edema bilateral lower extremities.  Left worse than right.  Does have some hammertoes on both feet.  Has pain with movement of his left ankle.  Some swelling of the foot also.  No erythema.  Sensation intact of her feet.  Skin: Skin is warm. Capillary refill takes less than 2 seconds.  Psychiatric: She has a normal mood and affect.     ED Treatments / Results  Labs (all labs ordered are listed, but only abnormal results are displayed) Labs Reviewed  CBC WITH  DIFFERENTIAL/PLATELET  BASIC METABOLIC PANEL    EKG None  Radiology Dg Ankle Complete Left  Result Date: 06/23/2017 CLINICAL DATA:  Swelling since Wednesday.  No injury. EXAM: LEFT ANKLE COMPLETE - 3+ VIEW COMPARISON:  Foot films of 10/13/2015 FINDINGS: Marked diffuse soft tissue swelling. No acute fracture or dislocation. Small Achilles spur. Mild osteopenia. No soft tissue gas or radiopaque foreign object. IMPRESSION: Soft tissue swelling, without acute osseous abnormality. Electronically Signed   By: Jeronimo Greaves M.D.   On: 06/23/2017 12:10   US Venous Img Lower Unilateral Left  Result Date: 06/23/2017 CLINICAL  DATA:  Pain and edema x4 days EXAM: LEFT LOWER EXTREMITY VENOUS DOPPLER ULTRASOUND TECHNIQUE: Gray-scale sonography with compression, as well as color and duplex ultrasound, were performed to evaluate the deep venous system from the level of the common femoral vein through the popliteal and proximal calf veins. COMPARISON:  None FINDINGS: Normal compressibility of the common femoral, superficial femoral, and popliteal veins, as well as the proximal calf veins. No filling defects to suggest DVT on grayscale or color Doppler imaging. Doppler waveforms show normal direction of venous flow, normal respiratory phasicity. Short term reflux was noted in the femoral vein post augmentation. Visualized segments of the saphenous venous system normal in caliber and compressibility. Survey views of the contralateral common femoral vein are unremarkable. IMPRESSION: 1.  No evidence of LEFT lower extremity deep vein thrombosis. 2. Mild reflux in the left femoral vein post augmentation suggesting a degree of valvular incompetence. Electronically Signed   By: Corlis Leak M.D.   On: 06/23/2017 13:02    Procedures Procedures (including critical care time)  Medications Ordered in ED Medications - No data to display   Initial Impression / Assessment and Plan / ED Course  I have reviewed the triage vital  signs and the nursing notes.  Pertinent labs & imaging results that were available during my care of the patient were reviewed by me and considered in my medical decision making (see chart for details).     Patient with ankle pain.  Some swelling of the leg also.  X-ray reassuring.  History of gout.  Doppler negative.  Lungs clear.  Will discharge home with treatment for gout.  Follow-up with PCP.  Final Clinical Impressions(s) / ED Diagnoses   Final diagnoses:  Acute left ankle pain  Acute gout, unspecified cause, unspecified site    ED Discharge Orders        Ordered    colchicine 0.6 MG tablet  Daily     06/23/17 1356       Benjiman Core, MD 06/23/17 [phone]

## 2017-06-23 NOTE — ED Notes (Signed)
Pt to US at this time.

## 2017-06-23 NOTE — Discharge Instructions (Signed)
Follow-up with Dr. Felecia Shelling for the ankle pain and increased swelling.  Your ankle pain could potentially be gout.  You have been given some medicine to help this.

## 2017-06-23 NOTE — ED Triage Notes (Signed)
Patient complains of left ankle pain and swelling. Patient denies injury. States she was started on amlodipine last Tuesday and started getting swelling in ankles with left being worse than right.

## 2017-07-12 DIAGNOSIS — H5210 Myopia, unspecified eye: Secondary | ICD-10-CM | POA: Diagnosis not present

## 2017-07-13 DIAGNOSIS — I739 Peripheral vascular disease, unspecified: Secondary | ICD-10-CM | POA: Diagnosis not present

## 2017-07-13 DIAGNOSIS — B351 Tinea unguium: Secondary | ICD-10-CM | POA: Diagnosis not present

## 2017-07-13 DIAGNOSIS — M19072 Primary osteoarthritis, left ankle and foot: Secondary | ICD-10-CM | POA: Diagnosis not present

## 2017-07-13 DIAGNOSIS — M19071 Primary osteoarthritis, right ankle and foot: Secondary | ICD-10-CM | POA: Diagnosis not present

## 2017-07-13 DIAGNOSIS — B353 Tinea pedis: Secondary | ICD-10-CM | POA: Diagnosis not present

## 2017-07-13 DIAGNOSIS — L84 Corns and callosities: Secondary | ICD-10-CM | POA: Diagnosis not present

## 2017-07-23 DIAGNOSIS — I10 Essential (primary) hypertension: Secondary | ICD-10-CM | POA: Diagnosis not present

## 2017-07-23 DIAGNOSIS — J449 Chronic obstructive pulmonary disease, unspecified: Secondary | ICD-10-CM | POA: Diagnosis not present

## 2017-08-06 ENCOUNTER — Other Ambulatory Visit: Payer: Self-pay | Admitting: Orthopedic Surgery

## 2017-08-30 DIAGNOSIS — L304 Erythema intertrigo: Secondary | ICD-10-CM | POA: Diagnosis not present

## 2017-08-30 DIAGNOSIS — E785 Hyperlipidemia, unspecified: Secondary | ICD-10-CM | POA: Diagnosis not present

## 2017-08-30 DIAGNOSIS — I10 Essential (primary) hypertension: Secondary | ICD-10-CM | POA: Diagnosis not present

## 2017-08-30 DIAGNOSIS — J449 Chronic obstructive pulmonary disease, unspecified: Secondary | ICD-10-CM | POA: Diagnosis not present

## 2017-09-17 ENCOUNTER — Ambulatory Visit (INDEPENDENT_AMBULATORY_CARE_PROVIDER_SITE_OTHER): Payer: Medicare HMO

## 2017-09-17 ENCOUNTER — Encounter: Payer: Self-pay | Admitting: Orthopedic Surgery

## 2017-09-17 ENCOUNTER — Ambulatory Visit: Payer: Medicare HMO | Admitting: Orthopedic Surgery

## 2017-09-17 VITALS — BP 124/64 | HR 70 | Ht 63.0 in | Wt 184.0 lb

## 2017-09-17 DIAGNOSIS — M75111 Incomplete rotator cuff tear or rupture of right shoulder, not specified as traumatic: Secondary | ICD-10-CM

## 2017-09-17 DIAGNOSIS — M25511 Pain in right shoulder: Secondary | ICD-10-CM

## 2017-09-17 DIAGNOSIS — Z9889 Other specified postprocedural states: Secondary | ICD-10-CM

## 2017-09-17 DIAGNOSIS — G8929 Other chronic pain: Secondary | ICD-10-CM | POA: Diagnosis not present

## 2017-09-17 MED ORDER — ACETAMINOPHEN-CODEINE #3 300-30 MG PO TABS: ORAL_TABLET | ORAL | 0 refills | Status: DC

## 2017-09-17 NOTE — Progress Notes (Signed)
Progress Note new problem  Patient ID: Heather [name], female   DOB: 11-17-1940, 77 y.o.   MRN: [id number] Chief Complaint  Patient presents with  . Shoulder Pain    right x3-4 months previous surgery in [place] approx 9 years ago     Chief Complaint  Patient presents with  . Shoulder Pain    right x3-4 months previous surgery in [place] approx 9 years ago    77 years old presents for evaluation of right shoulder pain  She is status post a right rotator cuff repair followed by right shoulder stem cell injection about 2 years after the repair.  She complains of pain in the right shoulder now for 3 to 4 months described as dull moderate severity unrelieved by Tylenol with codeine associated with weakness and decreased range of motion    Review of Systems  Constitutional: Negative for fever.  Musculoskeletal: Positive for back pain. Negative for neck pain.  Neurological: Negative for tingling.   Current Meds  Medication Sig  . acetaminophen-codeine (TYLENOL #3) 300-30 MG tablet TAKE ONE TABLET BY MOUTH EVERY 8 HOURS AS NEEDED FOR MODERATE PAIN.  [name] amLODipine (NORVASC) 5 MG tablet Take 1 tablet by mouth daily.  [name] aspirin EC 81 MG tablet Take 81 mg by mouth every morning.  [name] atorvastatin (LIPITOR) 10 MG tablet   . Calcium-Magnesium-Zinc 333-133-5 MG TABS Take 1 capsule by mouth daily.  . cholecalciferol (VITAMIN D) 1000 UNITS tablet Take 1,000 Units by mouth at bedtime.  . citalopram (CELEXA) 20 MG tablet Take 20 mg by mouth every morning.  . furosemide (LASIX) 40 MG tablet Take 20 mg by mouth daily.   . INCRUSE ELLIPTA 62.5 MCG/INH AEPB Inhale 1 puff into the lungs daily.   [name] losartan (COZAAR) 100 MG tablet Take 1 tablet by mouth daily.  . metoprolol (LOPRESSOR) 100 MG tablet Take 1 tablet by mouth daily.  . nitroGLYCERIN (NITROSTAT) 0.4 MG SL tablet Place 0.4 mg under the tongue every 5 (five) minutes as needed for chest pain.  [name] omeprazole (PRILOSEC) 20 MG capsule Take 20 mg by  mouth daily.  . potassium chloride (MICRO-K) 10 MEQ CR capsule Take 1 capsule by mouth 2 (two) times daily.  [name] PROAIR HFA 108 (90 BASE) MCG/ACT inhaler Inhale 2 puffs into the lungs every 6 (six) hours as needed for wheezing or shortness of breath.   . zolpidem (AMBIEN) 10 MG tablet Take 10 mg by mouth at bedtime as needed for sleep.    Past Medical History:  Diagnosis Date  . Anemia   . Anxiety   . Arthritis   . COPD (chronic obstructive pulmonary disease) (HCC)   . Depression   . GERD (gastroesophageal reflux disease)   . Glaucoma   . Gout   . HTN (hypertension)      Allergies  Allergen Reactions  . Lovastatin Swelling    Patient states that her tongue swells and she gets a tingling feeling all over.     BP 124/64   Pulse 70   Ht 5\' 3"  (1.6 m)   Wt 184 lb (83.5 kg)   BMI 32.59 kg/m    Physical Exam  Constitutional: She is oriented to person, place, and time. She appears well-developed and well-nourished.  Neck: Normal range of motion. Neck supple. No JVD present. No tracheal deviation present. No thyromegaly present.  Lymphadenopathy:    She has no cervical adenopathy.  Neurological: She is alert and oriented to person, place, and time.  Skin: Skin is warm and dry. Capillary refill takes less than 2 seconds. No rash noted. No erythema. No pallor.  Psychiatric: She has a normal mood and affect. Judgment normal.  Vitals reviewed.   Ortho Exam  Left shoulder limited flexion previous shoulder replacement.  She does have 30 degrees external rotation and passive flexion of 150  Right shoulder peri-acromial tenderness previous incisions look well-healed.  Her passive range of motion is normal her active range of motion is flexion 120 degrees abduction 75 degrees she has weak external rotation and weak flexion.  No instability.  Pulse and perfusion normal lymph nodes negative sensation normal    Fuller Canada, MD   MEDICAL DECISION MAKING   Imaging: Right  shoulder Imaging studies show 2 rotator cuff anchors which are in place she has proximal migration of the humerus the report no arthritis  NEW PROBLEM  Encounter Diagnoses  Name Primary?  . Chronic right shoulder pain Yes  . S/P right rotator cuff repair   . Nontraumatic incomplete tear of right rotator cuff      PLAN: (RX., injection, surgery,frx,mri/ct, XR 2 body ares) Injection  Physical therapy   Procedure note the subacromial injection shoulder RIGHT  Verbal consent was obtained to inject the  RIGHT   Shoulder  Timeout was completed to confirm the injection site is a subacromial space of the  RIGHT  shoulder   Medication used Depo-Medrol 40 mg and lidocaine 1% 3 cc  Anesthesia was provided by ethyl chloride  The injection was performed in the RIGHT  posterior subacromial space. After pinning the skin with alcohol and anesthetized the skin with ethyl chloride the subacromial space was injected using a 20-gauge needle. There were no complications  Sterile dressing was applied.    No orders of the defined types were placed in this encounter.  11:12 AM 09/17/2017

## 2017-09-17 NOTE — Addendum Note (Signed)
Addended byCaffie Damme on: 09/17/2017 11:26 AM   Modules accepted: Orders

## 2017-09-17 NOTE — Patient Instructions (Signed)

## 2017-09-21 ENCOUNTER — Ambulatory Visit (HOSPITAL_COMMUNITY): Payer: Medicare HMO | Attending: Orthopedic Surgery | Admitting: Occupational Therapy

## 2017-09-21 ENCOUNTER — Encounter (HOSPITAL_COMMUNITY): Payer: Self-pay | Admitting: Occupational Therapy

## 2017-09-21 ENCOUNTER — Other Ambulatory Visit: Payer: Self-pay

## 2017-09-21 DIAGNOSIS — M25511 Pain in right shoulder: Secondary | ICD-10-CM

## 2017-09-21 DIAGNOSIS — R29898 Other symptoms and signs involving the musculoskeletal system: Secondary | ICD-10-CM

## 2017-09-21 NOTE — Therapy (Signed)
[facility] [address] [place], [place], [address] Phone: [phone]   Fax:  [phone]  Occupational Therapy Evaluation  Patient Details  Name: Heather [name] MRN: [id number] Date of Birth: 1940-03-20 Referring Provider: Fuller Canada   Encounter Date: 09/21/2017  OT End of Session - 09/21/17 1158    Visit Number  1    Number of Visits  8    Date for OT Re-Evaluation  10/21/17    Authorization Type  [employer]-$10 copay    Authorization Time Period  visits based on medical necessity    OT Start Time  1120    OT Stop Time  1151    OT Time Calculation (min)  31 min    Activity Tolerance  Patient tolerated treatment well    Behavior During Therapy  [facility] for tasks assessed/performed       Past Medical History:  Diagnosis Date  . Anemia   . Anxiety   . Arthritis   . COPD (chronic obstructive pulmonary disease) (HCC)   . Depression   . GERD (gastroesophageal reflux disease)   . Glaucoma   . Gout   . HTN (hypertension)     Past Surgical History:  Procedure Laterality Date  . ABDOMINAL HYSTERECTOMY    . BACK SURGERY     lumbar-disc  . BUNIONECTOMY Bilateral   . FOOT SURGERY Left    repair of "kissing Cousins"  . JOINT REPLACEMENT Bilateral    hip   . JOINT REPLACEMENT Right    knee  . SHOULDER ARTHROSCOPY Right    shoulder surgery in [place] about 2000  . SHOULDER HEMI-ARTHROPLASTY Left 03/25/2014   Procedure: LEFT SHOULDER HEMI-ARTHROPLASTY;  Surgeon: Vickki Hearing, MD;  Location: [facility];  Service: Orthopedics;  Laterality: Left;    There were no vitals filed for this visit.  Subjective Assessment - 09/21/17 1145    Subjective   S: My shoulder hurts if I move it in certain directions.     Pertinent History  Pt is a 77 y/o female presenting with right shoulder pain present for approximately 4 months. Pt has a hx of a right arthroscopic RCR years ago. Pt's goal is to reduce her pain and improve her  mobility without any surgery. Pt was referred to occupational therapy by Dr. Fuller Canada.     Special Tests  FOTO: 62/100 (38% limited)    Patient Stated Goals  To reduce my pain and improve my shoulder mobility.     Currently in Pain?  Yes    Pain Score  3     Pain Location  Shoulder    Pain Orientation  Right    Pain Descriptors / Indicators  Sharp    Pain Type  Acute pain    Pain Radiating Towards  N/A    Pain Onset  More than a month ago    Pain Frequency  Intermittent    Aggravating Factors   movement    Pain Relieving Factors  rest, pain medication    Effect of Pain on Daily Activities  min/mod effect on ADLs    Multiple Pain Sites  No        [facility] OT Assessment - 09/21/17 1119      Assessment   Medical Diagnosis  right shoulder pain    Referring Provider  Fuller Canada    Onset Date/Surgical Date  05/22/17    Hand Dominance  Right    Prior Therapy  OT on  left shoulder       Precautions   Precautions  None      Restrictions   Weight Bearing Restrictions  No      Balance Screen   Has the patient fallen in the past 6 months  No    Has the patient had a decrease in activity level because of a fear of falling?   No    Is the patient reluctant to leave their home because of a fear of falling?   No      Prior Function   Level of Independence  Independent    Vocation  Retired    Geologist, engineering, baking      ADL   ADL comments  Pt is having difficulty with certain movements during dressing, overhead reaching, washing back, dressing, lifting pots/pans during meal preparation      Written Expression   Dominant Hand  Right      Cognition   Overall Cognitive Status  Within Functional Limits for tasks assessed      Observation/Other Assessments   Focus on Therapeutic Outcomes (FOTO)   62/100      ROM / Strength   AROM / PROM / Strength  AROM;PROM;Strength      Palpation   Palpation comment  Mod fascial restrictions in right upper arm and trapezius regions       AROM   Overall AROM Comments  Assessed seated, er/IR adducted    AROM Assessment Site  Shoulder    Right/Left Shoulder  Right    Right Shoulder Flexion  150 Degrees    Right Shoulder ABduction  131 Degrees    Right Shoulder Internal Rotation  90 Degrees    Right Shoulder External Rotation  74 Degrees      PROM   Overall PROM Comments  Assessed supine, er/IR adducted    PROM Assessment Site  Shoulder    Right/Left Shoulder  Right    Right Shoulder Flexion  165 Degrees    Right Shoulder ABduction  180 Degrees    Right Shoulder Internal Rotation  90 Degrees    Right Shoulder External Rotation  80 Degrees      Strength   Overall Strength Comments  Assessed seated, er/IR adducted    Strength Assessment Site  Shoulder    Right/Left Shoulder  Right    Right Shoulder Flexion  4-/5    Right Shoulder ABduction  4-/5    Right Shoulder Internal Rotation  5/5    Right Shoulder External Rotation  4-/5                      OT Education - 09/21/17 1145    Education Details  shoulder stretches    Person(s) Educated  Patient    Methods  Explanation;Demonstration;Handout    Comprehension  Verbalized understanding;Returned demonstration       OT Short Term Goals - 09/21/17 1202      OT SHORT TERM GOAL #1   Title  Pt will be educated on HEP to improve RUE mobility required for ADL completion.     Time  4    Period  Weeks    Status  New    Target Date  10/21/17      OT SHORT TERM GOAL #2   Title  Pt will decrease pain in RUE to 2/10 or less to improve ability to drive using right arm as dominant.     Time  4  Period  Weeks    Status  New      OT SHORT TERM GOAL #3   Title  Pt will decrease fascial restrictions to minimal amounts to improve mobility required for overhead reaching.     Time  4    Period  Weeks    Status  New      OT SHORT TERM GOAL #4   Title  Pt will improve RUE ROM to WNL to increase ability to perform dressing and bathing tasks.     Time   4    Period  Weeks    Status  New      OT SHORT TERM GOAL #5   Title  Pt will improve strength in RUE to 5/5 to improve ability to lift pots and pans during meal preparation.                Plan - 09/21/17 1158    Clinical Impression Statement  A: Pt is a 77 y/o female presenting with right shoulder pain limiting her ability to complete ADLs. Pt continues to use RUE as dominant as she has a hx of LUE TSA which limits ROM in the left shoulder, preventing overhead reaching assist from the LUE. Pt educated on shoulder stretches for HEP today.     Occupational Profile and client history currently impacting functional performance  Pt is independent in ADLs and is motivated to maintain her highest level of functioning during daily tasks.    Occupational performance deficits (Please refer to evaluation for details):  ADL's;IADL's;Rest and Sleep;Leisure    Rehab Potential  Good    OT Frequency  2x / week    OT Duration  4 weeks    OT Treatment/Interventions  Self-care/ADL training;Therapeutic exercise;Ultrasound;Manual Therapy;Therapeutic activities;Cryotherapy;Electrical Stimulation;DME and/or AE instruction;Moist Heat;Passive range of motion;Patient/family education    Plan  P: Pt will benefit from skilled OT services to decrease pain and fascial restrictions, and improve ROM, strength, and functional use of the RUE during functional tasks. Treatment plan: myofascial release, manual therapy, P/ROM, A/ROM, general RUE strengthening, scapular stability and strengthening, modalities prn    Clinical Decision Making  Limited treatment options, no task modification necessary    OT Home Exercise Plan  7/19: shoulder stretches    Consulted and Agree with Plan of Care  Patient       Patient will benefit from skilled therapeutic intervention in order to improve the following deficits and impairments:  Decreased activity tolerance, Decreased strength, Decreased range of motion, Pain, Increased  fascial restrictions, Impaired UE functional use  Visit Diagnosis: Acute pain of right shoulder  Other symptoms and signs involving the musculoskeletal system    Problem List Patient Active Problem List   Diagnosis Date Noted  . HTN (hypertension) 04/17/2014  . Dry eyes 04/14/2014  . Bleeding from the nose 04/01/2014  . Renal insufficiency 04/01/2014  . Rotator cuff arthropathy   . Muscle weakness (generalized) 05/26/2013  . Pain in joint, shoulder region 05/26/2013  . Rotator cuff syndrome 05/22/2013   Ezra Sites, OTR/L  [phone] 09/21/2017, 12:05 PM  [facility] [address] [place], [place], [address] Phone: [phone]   Fax:  [phone]  Name: Heather [name] MRN: [id number] Date of Birth: 1941-01-01

## 2017-09-21 NOTE — Patient Instructions (Signed)
  1) Flexion Wall Stretch    Face wall, place affected handon wall in front of you. Slide hand up the wall  and lean body in towards the wall. Hold for 10 seconds. Repeat 3-5 times. 1-2 times/day.      2) Corner Stretch    Stand at a corner of a wall, place your arms on the walls with elbows bent. Lean into the corner until a stretch is felt along the front of your chest and/or shoulders. Hold for 10 seconds. Repeat 3-5X, 1-2 times/day.    3) Posterior Capsule Stretch    Bring the involved arm across chest. Grasp elbow and pull toward chest until you feel a stretch in the back of the upper arm and shoulder. Hold 10 seconds. Repeat 3-5X. Complete 1-2 times/day.    4) Scapular Retraction    Tuck chin back as you pinch shoulder blades together.  Hold 5 seconds. Repeat 3-5X. Complete 1-2 times/day.

## 2017-09-26 ENCOUNTER — Ambulatory Visit (HOSPITAL_COMMUNITY): Payer: Medicare HMO

## 2017-09-26 ENCOUNTER — Encounter (HOSPITAL_COMMUNITY): Payer: Self-pay

## 2017-09-26 ENCOUNTER — Other Ambulatory Visit: Payer: Self-pay

## 2017-09-26 DIAGNOSIS — R29898 Other symptoms and signs involving the musculoskeletal system: Secondary | ICD-10-CM | POA: Diagnosis not present

## 2017-09-26 DIAGNOSIS — M25511 Pain in right shoulder: Secondary | ICD-10-CM

## 2017-09-26 NOTE — Patient Instructions (Signed)

## 2017-09-26 NOTE — Therapy (Signed)
[place] [address] [place], [place], [address] Phone: [phone]   Fax:  [phone]  Occupational Therapy Treatment  Patient Details  Name: Heather [name] MRN: [id number] Date of Birth: [birth year] Referring Provider: Fuller Canada   Encounter Date: 09/26/2017  OT End of Session - 09/26/17 1201    Visit Number  2    Number of Visits  8    Date for OT Re-Evaluation  10/21/17    Authorization Type  [employer]-$10 copay    Authorization Time Period  visits based on medical necessity    OT Start Time  1119    OT Stop Time  1157    OT Time Calculation (min)  38 min    Activity Tolerance  Patient tolerated treatment well    Behavior During Therapy  [facility] for tasks assessed/performed       Past Medical History:  Diagnosis Date  . Anemia   . Anxiety   . Arthritis   . COPD (chronic obstructive pulmonary disease) (HCC)   . Depression   . GERD (gastroesophageal reflux disease)   . Glaucoma   . Gout   . HTN (hypertension)     Past Surgical History:  Procedure Laterality Date  . ABDOMINAL HYSTERECTOMY    . BACK SURGERY     lumbar-disc  . BUNIONECTOMY Bilateral   . FOOT SURGERY Left    repair of "kissing Cousins"  . JOINT REPLACEMENT Bilateral    hip   . JOINT REPLACEMENT Right    knee  . SHOULDER ARTHROSCOPY Right    shoulder surgery in [place] about 2000  . SHOULDER HEMI-ARTHROPLASTY Left 03/25/2014   Procedure: LEFT SHOULDER HEMI-ARTHROPLASTY;  Surgeon: Vickki Hearing, MD;  Location: [facility];  Service: Orthopedics;  Laterality: Left;    There were no vitals filed for this visit.  Subjective Assessment - 09/26/17 1138    Subjective   S: I was watching a christian program this weekend and they had a call out for the right shoulder. I held my arm out and it has been better since then. I almost called to cancel this appointment.    Currently in Pain?  Yes    Pain Score  1     Pain Location  Shoulder    Pain  Orientation  Right    Pain Descriptors / Indicators  Aching    Pain Type  Acute pain    Pain Radiating Towards  N/A    Pain Onset  More than a month ago    Pain Frequency  Intermittent    Aggravating Factors   movement    Pain Relieving Factors  rest, pain medication    Effect of Pain on Daily Activities  min effect on ADLs    Multiple Pain Sites  No         [facility] OT Assessment - 09/26/17 1142      Assessment   Medical Diagnosis  right shoulder pain      Precautions   Precautions  None               OT Treatments/Exercises (OP) - 09/26/17 1143      Exercises   Exercises  Shoulder      Shoulder Exercises: Supine   Protraction  PROM;5 reps;AROM;10 reps    Horizontal ABduction  PROM;5 reps;AROM;10 reps    External Rotation  PROM;5 reps;AROM;10 reps    Internal Rotation  PROM;5 reps;AROM;10 reps    Flexion  PROM;5 reps;AROM;10 reps    ABduction  PROM;5 reps;AROM;10 reps      Shoulder Exercises: Standing   Protraction  AROM;10 reps    Horizontal ABduction  AROM;10 reps    External Rotation  AROM;10 reps    Internal Rotation  AROM;10 reps    Flexion  AROM;10 reps    ABduction  AROM;10 reps      Shoulder Exercises: ROM/Strengthening   Wall Wash  1'    Proximal Shoulder Strengthening, Supine  10X with no rest breaks    Proximal Shoulder Strengthening, Seated  10X with no rest breaks      Manual Therapy   Manual Therapy  Myofascial release    Manual therapy comments  Performed separately from all other interventions    Myofascial Release  Myofascial release performed on upper arm, trapezius, and scapularis regions in order to decrease fascial restrictions causing increased pain and limiting ROM             OT Education - 09/26/17 1200    Education Details  Patient reviewed evaluation from last session and goals with therapist. Patient is in agreement with all. HEP updated to include A/ROM in standing.    Person(s) Educated  Patient    Methods   Explanation;Demonstration;Handout    Comprehension  Verbalized understanding;Returned demonstration       OT Short Term Goals - 09/26/17 1201      OT SHORT TERM GOAL #1   Title  Pt will be educated on HEP to improve RUE mobility required for ADL completion.     Time  4    Period  Weeks    Status  On-going      OT SHORT TERM GOAL #2   Title  Pt will decrease pain in RUE to 2/10 or less to improve ability to drive using right arm as dominant.     Time  4    Period  Weeks    Status  On-going      OT SHORT TERM GOAL #3   Title  Pt will decrease fascial restrictions to minimal amounts to improve mobility required for overhead reaching.     Time  4    Period  Weeks    Status  On-going      OT SHORT TERM GOAL #4   Title  Pt will improve RUE ROM to WNL to increase ability to perform dressing and bathing tasks.     Time  4    Period  Weeks    Status  On-going      OT SHORT TERM GOAL #5   Title  Pt will improve strength in RUE to 5/5 to improve ability to lift pots and pans during meal preparation.     Status  On-going               Plan - 09/26/17 1202    Clinical Impression Statement  A: Large muscle knot palpated in anterior aspect of upper arm this session. Minimal faschial restrictions in upper trapezius region. Patient reporting her shoulder has been feeling much better since her evaluation and her pain has decreased. She is demonstrating WNL P/ROM and A/ROM this session. Patient performed well with all exercises with minimal pain and fatigue. No rest breaks required. VC for form and technique.     Occupational performance deficits (Please refer to evaluation for details):  Rest and Sleep    Plan  P: Continue with manual techniques to address muscle knot in anterior  upper arm. Progress to strengthening in supine and increase reps for AROM in standing to 15. Add UBE and overhead lacing.    OT Home Exercise Plan  shoulder stretches and A/ROM in standing    Consulted and  Agree with Plan of Care  Patient       Patient will benefit from skilled therapeutic intervention in order to improve the following deficits and impairments:  Decreased activity tolerance, Decreased strength, Decreased range of motion, Pain, Increased fascial restrictions, Impaired UE functional use  Visit Diagnosis: Acute pain of right shoulder  Other symptoms and signs involving the musculoskeletal system    Problem List Patient Active Problem List   Diagnosis Date Noted  . HTN (hypertension) 04/17/2014  . Dry eyes 04/14/2014  . Bleeding from the nose 04/01/2014  . Renal insufficiency 04/01/2014  . Rotator cuff arthropathy   . Muscle weakness (generalized) 05/26/2013  . Pain in joint, shoulder region 05/26/2013  . Rotator cuff syndrome 05/22/2013    Holley Raring, OT student 09/26/2017, 12:07 PM  [place] [place] [facility] [address] [place], [place], [address] Phone: [phone]   Fax:  [phone]  Name: Heather [name] MRN: [id number] Date of Birth: 01/01/1941

## 2017-09-28 ENCOUNTER — Other Ambulatory Visit: Payer: Self-pay

## 2017-09-28 ENCOUNTER — Encounter (HOSPITAL_COMMUNITY): Payer: Self-pay

## 2017-09-28 ENCOUNTER — Ambulatory Visit (HOSPITAL_COMMUNITY): Payer: Medicare HMO

## 2017-09-28 DIAGNOSIS — R29898 Other symptoms and signs involving the musculoskeletal system: Secondary | ICD-10-CM

## 2017-09-28 DIAGNOSIS — M25511 Pain in right shoulder: Secondary | ICD-10-CM

## 2017-09-28 NOTE — Therapy (Signed)
[facility] [address] [place], [place], [address] Phone: [phone]   Fax:  [phone]  Occupational Therapy Treatment  Patient Details  Name: Heather [name] MRN: [id number] Date of Birth: 1940/12/16 Referring Provider: Fuller Canada   Encounter Date: 09/28/2017  OT End of Session - 09/28/17 1047    Visit Number  3    Number of Visits  8    Date for OT Re-Evaluation  10/21/17    Authorization Type  [employer]-$10 copay    Authorization Time Period  visits based on medical necessity    OT Start Time  1032    OT Stop Time  1111    OT Time Calculation (min)  39 min    Activity Tolerance  Patient tolerated treatment well    Behavior During Therapy  [facility] for tasks assessed/performed       Past Medical History:  Diagnosis Date  . Anemia   . Anxiety   . Arthritis   . COPD (chronic obstructive pulmonary disease) (HCC)   . Depression   . GERD (gastroesophageal reflux disease)   . Glaucoma   . Gout   . HTN (hypertension)     Past Surgical History:  Procedure Laterality Date  . ABDOMINAL HYSTERECTOMY    . BACK SURGERY     lumbar-disc  . BUNIONECTOMY Bilateral   . FOOT SURGERY Left    repair of "kissing Cousins"  . JOINT REPLACEMENT Bilateral    hip   . JOINT REPLACEMENT Right    knee  . SHOULDER ARTHROSCOPY Right    shoulder surgery in [place] about 2000  . SHOULDER HEMI-ARTHROPLASTY Left 03/25/2014   Procedure: LEFT SHOULDER HEMI-ARTHROPLASTY;  Surgeon: Vickki Hearing, MD;  Location: [facility];  Service: Orthopedics;  Laterality: Left;    There were no vitals filed for this visit.  Subjective Assessment - 09/28/17 1032    Subjective   S: It is still feeling really good.    Currently in Pain?  No/denies    Pain Onset  More than a month ago         [facility] OT Assessment - 09/28/17 1032      Assessment   Medical Diagnosis  right shoulder pain      Precautions   Precautions  None                OT Treatments/Exercises (OP) - 09/28/17 1032      Exercises   Exercises  Shoulder      Shoulder Exercises: Supine   Protraction  PROM;5 reps;Strengthening;10 reps    Protraction Weight (lbs)  1    Horizontal ABduction  PROM;5 reps;Strengthening;10 reps    Horizontal ABduction Weight (lbs)  1    External Rotation  PROM;5 reps;Strengthening;10 reps    External Rotation Weight (lbs)  1    Internal Rotation  PROM;5 reps;Strengthening;10 reps    Internal Rotation Weight (lbs)  1    Flexion  PROM;5 reps;Strengthening;10 reps    Shoulder Flexion Weight (lbs)  1    ABduction  PROM;5 reps;Strengthening;10 reps    Shoulder ABduction Weight (lbs)  1      Shoulder Exercises: Standing   Protraction  Strengthening;10 reps    Protraction Weight (lbs)  1    Horizontal ABduction  Strengthening;10 reps    Horizontal ABduction Weight (lbs)  1    External Rotation  Strengthening;10 reps    External Rotation Weight (lbs)  1    Internal Rotation  Strengthening;10 reps    Internal Rotation Weight (lbs)  1    Flexion  Strengthening;10 reps    Shoulder Flexion Weight (lbs)  1    ABduction  Strengthening;10 reps    Shoulder ABduction Weight (lbs)  1      Shoulder Exercises: ROM/Strengthening   UBE (Upper Arm Bike)  2' forward and 2' reverse    Wall Wash  1'    X to V Arms  10X with right arm only    Proximal Shoulder Strengthening, Supine  10X with no rest breaks; 1#    Proximal Shoulder Strengthening, Seated  10X with no rest breaks; 1#      Manual Therapy   Manual Therapy  Myofascial release    Manual therapy comments  Performed separately from all other interventions    Myofascial Release  Myofascial release performed on upper arm, trapezius, and scapularis regions in order to decrease fascial restrictions causing increased pain and limiting ROM             OT Education - 09/28/17 1045    Education Details  Patient instructed she can begin strengthening with light  weight as part of HEP.    Person(s) Educated  Patient    Methods  Explanation;Demonstration    Comprehension  Verbalized understanding;Returned demonstration       OT Short Term Goals - 09/26/17 1201      OT SHORT TERM GOAL #1   Title  Pt will be educated on HEP to improve RUE mobility required for ADL completion.     Time  4    Period  Weeks    Status  On-going      OT SHORT TERM GOAL #2   Title  Pt will decrease pain in RUE to 2/10 or less to improve ability to drive using right arm as dominant.     Time  4    Period  Weeks    Status  On-going      OT SHORT TERM GOAL #3   Title  Pt will decrease fascial restrictions to minimal amounts to improve mobility required for overhead reaching.     Time  4    Period  Weeks    Status  On-going      OT SHORT TERM GOAL #4   Title  Pt will improve RUE ROM to WNL to increase ability to perform dressing and bathing tasks.     Time  4    Period  Weeks    Status  On-going      OT SHORT TERM GOAL #5   Title  Pt will improve strength in RUE to 5/5 to improve ability to lift pots and pans during meal preparation.     Status  On-going               Plan - 09/28/17 1053    Clinical Impression Statement  A: Large muscle knot palpated in anterior aspect of upper arm this session. Minimal fascial restrictions in upper trapezius region. Patient continues to report that her shoulder has been pain free and feeling much better this week. Patient able to progress to strengthening in supine and standing. X to V arms and UBE added this session. Patient demonstrating full ROM during P/ROM and strengthening exercises. No rest breaks required. VC for form and technique.     Plan  P: Continue with manual techniques to address muscle knot in anterior upper arm. Discharge passive stretching as patient is demonstrating full  ROM. Increase reps for strengthening exercises if patient is still reporting no pain. Add ball on the wall exercises.     OT Home  Exercise Plan  shoulder strengthening    Consulted and Agree with Plan of Care  Patient       Patient will benefit from skilled therapeutic intervention in order to improve the following deficits and impairments:  Decreased activity tolerance, Decreased strength, Decreased range of motion, Pain, Increased fascial restrictions, Impaired UE functional use  Visit Diagnosis: Acute pain of right shoulder  Other symptoms and signs involving the musculoskeletal system    Problem List Patient Active Problem List   Diagnosis Date Noted  . HTN (hypertension) 04/17/2014  . Dry eyes 04/14/2014  . Bleeding from the nose 04/01/2014  . Renal insufficiency 04/01/2014  . Rotator cuff arthropathy   . Muscle weakness (generalized) 05/26/2013  . Pain in joint, shoulder region 05/26/2013  . Rotator cuff syndrome 05/22/2013    Holley Raring, OT student 09/28/2017, 11:12 AM  [place] [facility] [address] [place], [place], [address] Phone: [phone]   Fax:  [phone]  Name: Heather [name] MRN: [id number] Date of Birth: [birth year]

## 2017-10-02 ENCOUNTER — Other Ambulatory Visit: Payer: Self-pay

## 2017-10-02 ENCOUNTER — Ambulatory Visit (HOSPITAL_COMMUNITY): Payer: Medicare HMO

## 2017-10-02 ENCOUNTER — Encounter (HOSPITAL_COMMUNITY): Payer: Self-pay

## 2017-10-02 DIAGNOSIS — R29898 Other symptoms and signs involving the musculoskeletal system: Secondary | ICD-10-CM | POA: Diagnosis not present

## 2017-10-02 DIAGNOSIS — M25511 Pain in right shoulder: Secondary | ICD-10-CM

## 2017-10-02 NOTE — Patient Instructions (Signed)

## 2017-10-02 NOTE — Therapy (Signed)
[place] [facility] [address] [place], [place], [address] Phone: [phone]   Fax:  [phone]  Occupational Therapy Treatment  Patient Details  Name: Heather [name] MRN: [id number] Date of Birth: 11-11-1940 Referring Provider: Fuller Canada   Encounter Date: 10/02/2017  OT End of Session - 10/02/17 1053    Visit Number  4    Number of Visits  8    Date for OT Re-Evaluation  10/21/17    Authorization Type  [employer]-$10 copay    Authorization Time Period  visits based on medical necessity    OT Start Time  1037    OT Stop Time  1115    OT Time Calculation (min)  38 min    Activity Tolerance  Patient tolerated treatment well    Behavior During Therapy  [facility] for tasks assessed/performed       Past Medical History:  Diagnosis Date  . Anemia   . Anxiety   . Arthritis   . COPD (chronic obstructive pulmonary disease) (HCC)   . Depression   . GERD (gastroesophageal reflux disease)   . Glaucoma   . Gout   . HTN (hypertension)     Past Surgical History:  Procedure Laterality Date  . ABDOMINAL HYSTERECTOMY    . BACK SURGERY     lumbar-disc  . BUNIONECTOMY Bilateral   . FOOT SURGERY Left    repair of "kissing Cousins"  . JOINT REPLACEMENT Bilateral    hip   . JOINT REPLACEMENT Right    knee  . SHOULDER ARTHROSCOPY Right    shoulder surgery in [place] about 2000  . SHOULDER HEMI-ARTHROPLASTY Left 03/25/2014   Procedure: LEFT SHOULDER HEMI-ARTHROPLASTY;  Surgeon: Vickki Hearing, MD;  Location: [facility];  Service: Orthopedics;  Laterality: Left;    There were no vitals filed for this visit.  Subjective Assessment - 10/02/17 1049    Subjective   S: I've been doing the exercises while holding a water bottle.    Currently in Pain?  No/denies    Pain Onset  More than a month ago         [facility] OT Assessment - 10/02/17 1050      Assessment   Medical Diagnosis  right shoulder pain      Precautions   Precautions   None               OT Treatments/Exercises (OP) - 10/02/17 1050      Shoulder Exercises: Supine   Protraction  PROM;5 reps;Strengthening;15 reps    Protraction Weight (lbs)  1    Horizontal ABduction  PROM;5 reps;Strengthening;15 reps    Horizontal ABduction Weight (lbs)  1    External Rotation  PROM;5 reps;Strengthening;15 reps    External Rotation Weight (lbs)  1    Internal Rotation  PROM;5 reps;Strengthening;15 reps    Internal Rotation Weight (lbs)  1    Flexion  PROM;5 reps;Strengthening;20 reps    Shoulder Flexion Weight (lbs)  1    ABduction  PROM;5 reps;Strengthening;15 reps    Shoulder ABduction Weight (lbs)  1      Shoulder Exercises: Standing   Protraction  Strengthening;15 reps    Protraction Weight (lbs)  1    Horizontal ABduction  Strengthening;15 reps    Horizontal ABduction Weight (lbs)  1    External Rotation  Strengthening;15 reps    External Rotation Weight (lbs)  1    Internal Rotation  Strengthening;15 reps    Internal  Rotation Weight (lbs)  1    Flexion  Strengthening;15 reps    Shoulder Flexion Weight (lbs)  1    ABduction  Strengthening;15 reps    Shoulder ABduction Weight (lbs)  1    Extension  Theraband;10 reps    Theraband Level (Shoulder Extension)  Level 2 (Red)    Row  Theraband;10 reps    Theraband Level (Shoulder Row)  Level 2 (Red)    Retraction  Theraband;10 reps    Theraband Level (Shoulder Retraction)  Level 2 (Red)      Shoulder Exercises: ROM/Strengthening   UBE (Upper Arm Bike)  --    X to V Arms  15X; 1# with RUE only    Proximal Shoulder Strengthening, Supine  15X; 1# with no rest breaks     Proximal Shoulder Strengthening, Seated  15X; 1# with no rest breaks    Ball on Wall  1' flexion and 30" abduction      Manual Therapy   Manual Therapy  Myofascial release    Manual therapy comments  Performed separately from all other interventions    Myofascial Release  Myofascial release performed on upper arm, trapezius, and  scapularis regions in order to decrease fascial restrictions causing increased pain and limiting ROM             OT Education - 10/02/17 1113    Education Details  Scapular theraband exercises added to HEP.    Person(s) Educated  Patient    Methods  Explanation;Demonstration;Handout;Verbal cues    Comprehension  Verbalized understanding;Returned demonstration       OT Short Term Goals - 09/26/17 1201      OT SHORT TERM GOAL #1   Title  Pt will be educated on HEP to improve RUE mobility required for ADL completion.     Time  4    Period  Weeks    Status  On-going      OT SHORT TERM GOAL #2   Title  Pt will decrease pain in RUE to 2/10 or less to improve ability to drive using right arm as dominant.     Time  4    Period  Weeks    Status  On-going      OT SHORT TERM GOAL #3   Title  Pt will decrease fascial restrictions to minimal amounts to improve mobility required for overhead reaching.     Time  4    Period  Weeks    Status  On-going      OT SHORT TERM GOAL #4   Title  Pt will improve RUE ROM to WNL to increase ability to perform dressing and bathing tasks.     Time  4    Period  Weeks    Status  On-going      OT SHORT TERM GOAL #5   Title  Pt will improve strength in RUE to 5/5 to improve ability to lift pots and pans during meal preparation.     Status  On-going               Plan - 10/02/17 1055    Clinical Impression Statement  A: Moderate fascial restrictions palpated in anterior upper arm and upper trapezius region. Patient continues to report that her shoulder has been pain free. Patient able to increase reps for supine and standing strengthening exercises. Patient demonstrating good form and endurance during exercises. Minimal fatigue at end of session. Ball on the wall and scapular exercises added.  VC for form and technique.     Plan  P: Continue with manual techniques to address fascial restrictions in anterior upper arm and upper trapezius  region. Increase weight for strengthening exercises. Add overhead lacing    OT Home Exercise Plan  shoulder strengthening    Consulted and Agree with Plan of Care  Patient       Patient will benefit from skilled therapeutic intervention in order to improve the following deficits and impairments:  Decreased activity tolerance, Decreased strength, Decreased range of motion, Pain, Increased fascial restrictions, Impaired UE functional use  Visit Diagnosis: Acute pain of right shoulder  Other symptoms and signs involving the musculoskeletal system    Problem List Patient Active Problem List   Diagnosis Date Noted  . HTN (hypertension) 04/17/2014  . Dry eyes 04/14/2014  . Bleeding from the nose 04/01/2014  . Renal insufficiency 04/01/2014  . Rotator cuff arthropathy   . Muscle weakness (generalized) 05/26/2013  . Pain in joint, shoulder region 05/26/2013  . Rotator cuff syndrome 05/22/2013    Holley Raring, OT student 10/02/2017, 11:15 AM  [facility] [address] [place], [place], [address] Phone: [phone]   Fax:  [phone]  Name: Heather [name] MRN: [id number] Date of Birth: 05-22-1940

## 2017-10-04 DIAGNOSIS — J449 Chronic obstructive pulmonary disease, unspecified: Secondary | ICD-10-CM | POA: Diagnosis not present

## 2017-10-04 DIAGNOSIS — I10 Essential (primary) hypertension: Secondary | ICD-10-CM | POA: Diagnosis not present

## 2017-10-05 ENCOUNTER — Encounter (HOSPITAL_COMMUNITY): Payer: Self-pay | Admitting: Specialist

## 2017-10-05 ENCOUNTER — Ambulatory Visit (HOSPITAL_COMMUNITY): Payer: Medicare HMO | Attending: Orthopedic Surgery | Admitting: Specialist

## 2017-10-05 DIAGNOSIS — M25511 Pain in right shoulder: Secondary | ICD-10-CM

## 2017-10-05 DIAGNOSIS — R29898 Other symptoms and signs involving the musculoskeletal system: Secondary | ICD-10-CM | POA: Insufficient documentation

## 2017-10-05 NOTE — Therapy (Addendum)
[facility] [address] [place], [place], [address] Phone: [phone]   Fax:  [phone]  Occupational Therapy Treatment  Patient Details  Name: Heather [name] MRN: [id number] Date of Birth: [birth year] Referring Provider: Dr. Arther Abbott   Encounter Date: 10/05/2017  OT End of Session - 10/05/17 1207    Visit Number  5    Number of Visits  8    Date for OT Re-Evaluation  10/21/17    Authorization Type  [employer]-$10 copay    Authorization Time Period  visits based on medical necessity    OT Start Time  1115    OT Stop Time  1145    OT Time Calculation (min)  30 min    Activity Tolerance  Patient tolerated treatment well    Behavior During Therapy  [facility] for tasks assessed/performed       Past Medical History:  Diagnosis Date  . Anemia   . Anxiety   . Arthritis   . COPD (chronic obstructive pulmonary disease) ([facility])   . Depression   . GERD (gastroesophageal reflux disease)   . Glaucoma   . Gout   . HTN (hypertension)     Past Surgical History:  Procedure Laterality Date  . ABDOMINAL HYSTERECTOMY    . BACK SURGERY     lumbar-disc  . BUNIONECTOMY Bilateral   . FOOT SURGERY Left    repair of "kissing Cousins"  . JOINT REPLACEMENT Bilateral    hip   . JOINT REPLACEMENT Right    knee  . SHOULDER ARTHROSCOPY Right    shoulder surgery in [place] about 2000  . SHOULDER HEMI-ARTHROPLASTY Left 03/25/2014   Procedure: LEFT SHOULDER HEMI-ARTHROPLASTY;  Surgeon: Carole Civil, MD;  Location: [facility];  Service: Orthopedics;  Laterality: Left;    There were no vitals filed for this visit.  Subjective Assessment - 10/05/17 1151    Subjective   S:  I used to have a hard time reaching the steering wheel and reaching behind my back.  I can do those things now without pain.  I think I am ready to be done with therapy.    Special Tests  FOTO 69/100    Currently in Pain?  No/denies         [facility] OT Assessment -  10/05/17 0001      Assessment   Medical Diagnosis  right shoulder pain    Referring Provider  Dr. Arther Abbott      ADL   ADL comments  able to use her right arm as dominant with all functional activities.        Observation/Other Assessments   Focus on Therapeutic Outcomes (FOTO)   69/100      AROM   Overall AROM Comments  Assessed seated, er/IR adducted    Right Shoulder Flexion  170 Degrees 150    Right Shoulder ABduction  165 Degrees 131    Right Shoulder Internal Rotation  90 Degrees 90    Right Shoulder External Rotation  75 Degrees 74      PROM   Overall PROM Comments  WNL in supine      Strength   Overall Strength Comments  Assessed seated, er/IR adducted    Right Shoulder Flexion  5/5 4-/5    Right Shoulder ABduction  5/5 4-/5    Right Shoulder Internal Rotation  5/5 5/5    Right Shoulder External Rotation  5/5 4-/5  OT Treatments/Exercises (OP) - 10/05/17 0001      Manual Therapy   Manual Therapy  Myofascial release    Manual therapy comments  Performed separately from all other interventions    Myofascial Release  Myofascial release performed on upper arm, trapezius, and scapularis regions in order to decrease fascial restrictions causing increased pain and limiting ROM             OT Education - 10/05/17 1207    Education Details  reviewed goals and progress    Person(s) Educated  Patient    Methods  Explanation    Comprehension  Verbalized understanding;Returned demonstration       OT Short Term Goals - 10/05/17 1142      OT SHORT TERM GOAL #1   Title  Pt will be educated on HEP to improve RUE mobility required for ADL completion.     Time  4    Period  Weeks    Status  Achieved      OT SHORT TERM GOAL #2   Title  Pt will decrease pain in RUE to 2/10 or less to improve ability to drive using right arm as dominant.     Time  4    Period  Weeks    Status  Achieved      OT SHORT TERM GOAL #3   Title  Pt will  decrease fascial restrictions to minimal amounts to improve mobility required for overhead reaching.     Time  4    Period  Weeks    Status  Achieved      OT SHORT TERM GOAL #4   Title  Pt will improve RUE ROM to WNL to increase ability to perform dressing and bathing tasks.     Time  4    Period  Weeks    Status  Achieved      OT SHORT TERM GOAL #5   Title  Pt will improve strength in RUE to 5/5 to improve ability to lift pots and pans during meal preparation.     Status  Achieved               Plan - 10/05/17 1208    Clinical Impression Statement  A:  Reassessment completed this date.  Patient now presents with minimal fascial restrictions in her right upper extremity, full A/ROM and 5/5 strength in her right shoulder.  She feels she is able to complete all of her desired daily activities utilizing her right arm as dominant.  Patient has met all OT goals and is ready for dc from skilled OT intervention.     Plan  P: DC from skilled OT interventionthis date.        Patient will benefit from skilled therapeutic intervention in order to improve the following deficits and impairments:  Decreased activity tolerance, Decreased strength, Decreased range of motion, Pain, Increased fascial restrictions, Impaired UE functional use  Visit Diagnosis: Acute pain of right shoulder  Other symptoms and signs involving the musculoskeletal system    Problem List Patient Active Problem List   Diagnosis Date Noted  . HTN (hypertension) 04/17/2014  . Dry eyes 04/14/2014  . Bleeding from the nose 04/01/2014  . Renal insufficiency 04/01/2014  . Rotator cuff arthropathy   . Muscle weakness (generalized) 05/26/2013  . Pain in joint, shoulder region 05/26/2013  . Rotator cuff syndrome 05/22/2013    Vangie Bicker, MHA, OTR/L [phone] OCCUPATIONAL THERAPY DISCHARGE SUMMARY  Visits from Start of  Care: 5 Current functional level related to goals / functional  outcomes: Independent with all desired activities   Remaining deficits: n/a   Education / Equipment: Strengthening and stability exercises  Plan: Patient agrees to discharge.  Patient goals were met. Patient is being discharged due to meeting the stated rehab goals.  ?????       Vangie Bicker, [place], OTR/L [phone]     10/05/2017, 12:18 PM  [facility] [address] [place], [place], [address] Phone: [phone]   Fax:  [phone]  Name: Heather [name] MRN: [id number] Date of Birth: 06-07-1940

## 2017-10-09 ENCOUNTER — Ambulatory Visit (HOSPITAL_COMMUNITY): Payer: Medicare HMO | Admitting: Occupational Therapy

## 2017-10-11 ENCOUNTER — Encounter (HOSPITAL_COMMUNITY): Payer: Medicare HMO

## 2017-10-16 ENCOUNTER — Encounter (HOSPITAL_COMMUNITY): Payer: Medicare HMO | Admitting: Occupational Therapy

## 2017-10-16 DIAGNOSIS — Z961 Presence of intraocular lens: Secondary | ICD-10-CM | POA: Diagnosis not present

## 2017-10-16 DIAGNOSIS — H26491 Other secondary cataract, right eye: Secondary | ICD-10-CM | POA: Diagnosis not present

## 2017-10-16 DIAGNOSIS — H18413 Arcus senilis, bilateral: Secondary | ICD-10-CM | POA: Diagnosis not present

## 2017-10-16 DIAGNOSIS — H02831 Dermatochalasis of right upper eyelid: Secondary | ICD-10-CM | POA: Diagnosis not present

## 2017-10-19 ENCOUNTER — Encounter (HOSPITAL_COMMUNITY): Payer: Medicare HMO

## 2017-11-04 DIAGNOSIS — J449 Chronic obstructive pulmonary disease, unspecified: Secondary | ICD-10-CM | POA: Diagnosis not present

## 2017-11-04 DIAGNOSIS — I10 Essential (primary) hypertension: Secondary | ICD-10-CM | POA: Diagnosis not present

## 2017-11-19 ENCOUNTER — Ambulatory Visit: Payer: Medicare HMO | Admitting: Orthopedic Surgery

## 2017-11-22 ENCOUNTER — Other Ambulatory Visit: Payer: Self-pay | Admitting: Orthopedic Surgery

## 2017-11-22 NOTE — Telephone Encounter (Signed)
Refill request received via phone call for Tylenol #3 please advise  I have completed a prior authorization request on cover my meds

## 2017-11-22 NOTE — Telephone Encounter (Signed)
Heather [name] from [facility] called asking about patient's request of Tylenol#3. Stated the request was faxed to [place] on 11/14/17. Stated it needed prior authorization.   Please call and advise [phone]  Fax# [phone]

## 2017-11-23 ENCOUNTER — Other Ambulatory Visit: Payer: Self-pay | Admitting: Orthopedic Surgery

## 2017-11-23 MED ORDER — ACETAMINOPHEN-CODEINE #3 300-30 MG PO TABS: ORAL_TABLET | ORAL | 0 refills | Status: DC

## 2017-12-04 DIAGNOSIS — J449 Chronic obstructive pulmonary disease, unspecified: Secondary | ICD-10-CM | POA: Diagnosis not present

## 2017-12-04 DIAGNOSIS — I10 Essential (primary) hypertension: Secondary | ICD-10-CM | POA: Diagnosis not present

## 2017-12-18 ENCOUNTER — Other Ambulatory Visit: Payer: Self-pay | Admitting: Orthopedic Surgery

## 2017-12-28 DIAGNOSIS — B353 Tinea pedis: Secondary | ICD-10-CM | POA: Diagnosis not present

## 2017-12-28 DIAGNOSIS — Z23 Encounter for immunization: Secondary | ICD-10-CM | POA: Diagnosis not present

## 2017-12-28 DIAGNOSIS — I739 Peripheral vascular disease, unspecified: Secondary | ICD-10-CM | POA: Diagnosis not present

## 2017-12-28 DIAGNOSIS — B351 Tinea unguium: Secondary | ICD-10-CM | POA: Diagnosis not present

## 2017-12-28 DIAGNOSIS — M19071 Primary osteoarthritis, right ankle and foot: Secondary | ICD-10-CM | POA: Diagnosis not present

## 2017-12-28 DIAGNOSIS — M19072 Primary osteoarthritis, left ankle and foot: Secondary | ICD-10-CM | POA: Diagnosis not present

## 2017-12-28 DIAGNOSIS — L84 Corns and callosities: Secondary | ICD-10-CM | POA: Diagnosis not present

## 2017-12-28 DIAGNOSIS — M2041 Other hammer toe(s) (acquired), right foot: Secondary | ICD-10-CM | POA: Diagnosis not present

## 2018-01-01 DIAGNOSIS — Z1212 Encounter for screening for malignant neoplasm of rectum: Secondary | ICD-10-CM | POA: Diagnosis not present

## 2018-01-01 DIAGNOSIS — Z1211 Encounter for screening for malignant neoplasm of colon: Secondary | ICD-10-CM | POA: Diagnosis not present

## 2018-01-03 ENCOUNTER — Other Ambulatory Visit: Payer: Self-pay | Admitting: Radiology

## 2018-01-09 DIAGNOSIS — H26492 Other secondary cataract, left eye: Secondary | ICD-10-CM | POA: Diagnosis not present

## 2018-01-09 DIAGNOSIS — Z961 Presence of intraocular lens: Secondary | ICD-10-CM | POA: Diagnosis not present

## 2018-01-09 DIAGNOSIS — H40013 Open angle with borderline findings, low risk, bilateral: Secondary | ICD-10-CM | POA: Diagnosis not present

## 2018-01-09 DIAGNOSIS — H26491 Other secondary cataract, right eye: Secondary | ICD-10-CM | POA: Diagnosis not present

## 2018-01-16 ENCOUNTER — Ambulatory Visit: Payer: Medicare HMO | Admitting: Orthopedic Surgery

## 2018-01-16 ENCOUNTER — Ambulatory Visit (INDEPENDENT_AMBULATORY_CARE_PROVIDER_SITE_OTHER): Payer: Medicare HMO

## 2018-01-16 ENCOUNTER — Encounter: Payer: Self-pay | Admitting: Orthopedic Surgery

## 2018-01-16 VITALS — BP 131/77 | HR 74 | Ht 63.0 in | Wt 180.0 lb

## 2018-01-16 DIAGNOSIS — G8929 Other chronic pain: Secondary | ICD-10-CM | POA: Diagnosis not present

## 2018-01-16 DIAGNOSIS — M25561 Pain in right knee: Secondary | ICD-10-CM | POA: Diagnosis not present

## 2018-01-16 DIAGNOSIS — Z96651 Presence of right artificial knee joint: Secondary | ICD-10-CM

## 2018-01-16 MED ORDER — ACETAMINOPHEN-CODEINE #3 300-30 MG PO TABS: 1.0000 | ORAL_TABLET | Freq: Four times a day (QID) | ORAL | 0 refills | Status: AC | PRN

## 2018-01-16 NOTE — Progress Notes (Signed)
NEW PROBLEM OFFICE VISIT  Chief Complaint  Patient presents with  . Knee Pain    right     77 year old female:  84-month history of right knee pain.  She had a right total knee.  Were not sure when.  Complains of anterior peripatellar pain occasionally sharp dull mostly moderately severe associated with no swelling   Review of Systems  Constitutional: Negative for chills and fever.  Musculoskeletal: Positive for back pain and joint pain.  Skin: Negative.      Past Medical History:  Diagnosis Date  . Anemia   . Anxiety   . Arthritis   . COPD (chronic obstructive pulmonary disease) ([facility])   . Depression   . GERD (gastroesophageal reflux disease)   . Glaucoma   . Gout   . HTN (hypertension)     Past Surgical History:  Procedure Laterality Date  . ABDOMINAL HYSTERECTOMY    . BACK SURGERY     lumbar-disc  . BUNIONECTOMY Bilateral   . FOOT SURGERY Left    repair of "kissing Cousins"  . JOINT REPLACEMENT Bilateral    hip   . JOINT REPLACEMENT Right 2010 or 2012   knee  . KNEE SURGERY    . SHOULDER ARTHROSCOPY Right    shoulder surgery in [place] about 2000  . SHOULDER HEMI-ARTHROPLASTY Left 03/25/2014   Procedure: LEFT SHOULDER HEMI-ARTHROPLASTY;  Surgeon: Vickki Hearing, MD;  Location: [facility];  Service: Orthopedics;  Laterality: Left;    Family History  Problem Relation Age of Onset  . High blood pressure Mother   . Aneurysm Father   . Diabetes Brother   . Pancreatitis Brother    Social History   Tobacco Use  . Smoking status: Former Smoker    Packs/day: 1.00    Years: 40.00    Pack years: 40.00    Types: Cigarettes    Last attempt to quit: 03/20/1985    Years since quitting: 32.8  . Smokeless tobacco: Never Used  Substance Use Topics  . Alcohol use: No  . Drug use: No    Allergies  Allergen Reactions  . Lovastatin Swelling    Patient states that her tongue swells and she gets a tingling feeling all over.    Current Meds  Medication Sig   . acetaminophen-codeine (TYLENOL #3) 300-30 MG tablet Take 1 tablet by mouth every 6 (six) hours as needed for moderate pain.  [name] amLODipine (NORVASC) 5 MG tablet Take 1 tablet by mouth daily.  [name] aspirin EC 81 MG tablet Take 81 mg by mouth every morning.  [name] atorvastatin (LIPITOR) 10 MG tablet   . Calcium-Magnesium-Zinc 333-133-5 MG TABS Take 1 capsule by mouth daily.  . cetirizine (ZYRTEC) 10 MG tablet Take 10 mg by mouth every morning.  . cholecalciferol (VITAMIN D) 1000 UNITS tablet Take 1,000 Units by mouth at bedtime.  . citalopram (CELEXA) 20 MG tablet Take 20 mg by mouth every morning.  . colchicine 0.6 MG tablet Take 1 tablet (0.6 mg total) by mouth daily.  . furosemide (LASIX) 40 MG tablet Take 20 mg by mouth daily.   . INCRUSE ELLIPTA 62.5 MCG/INH AEPB Inhale 1 puff into the lungs daily.   [name] lisinopril (PRINIVIL,ZESTRIL) 20 MG tablet Take 20 mg by mouth daily.  [name] losartan (COZAAR) 100 MG tablet Take 1 tablet by mouth daily.  . metoprolol (LOPRESSOR) 100 MG tablet Take 1 tablet by mouth daily.  . nitroGLYCERIN (NITROSTAT) 0.4 MG SL tablet Place 0.4 mg under the tongue  every 5 (five) minutes as needed for chest pain.  [name] omeprazole (PRILOSEC) 20 MG capsule Take 20 mg by mouth daily.  . potassium chloride (MICRO-K) 10 MEQ CR capsule Take 1 capsule by mouth 2 (two) times daily.  [name] PROAIR HFA 108 (90 BASE) MCG/ACT inhaler Inhale 2 puffs into the lungs every 6 (six) hours as needed for wheezing or shortness of breath.   . zolpidem (AMBIEN) 10 MG tablet Take 10 mg by mouth at bedtime as needed for sleep.  . [DISCONTINUED] acetaminophen-codeine (TYLENOL #3) 300-30 MG tablet TAKE ONE TABLET BY MOUTH EVERY 8 HOURS AS NEEDED FOR MODERATE PAIN.    BP 131/77   Pulse 74   Ht 5\' 3"  (1.6 m)   Wt 180 lb (81.6 kg)   BMI 31.89 kg/m   Physical Exam  Constitutional: She is oriented to person, place, and time. She appears well-developed and well-nourished. No distress.  HENT:  Head: Normocephalic  and atraumatic.  Cardiovascular: Normal rate and intact distal pulses.  PERIPHERAL EDEMA BILATERAL   Musculoskeletal:       Right knee: She exhibits no effusion.       Left knee: She exhibits no effusion.  Neurological: She is alert and oriented to person, place, and time. No sensory deficit. She exhibits normal muscle tone. Coordination normal.  Skin: Skin is warm and dry. Capillary refill takes less than 2 seconds. No rash noted. She is not diaphoretic. No erythema. No pallor.  Psychiatric: She has a normal mood and affect. Her behavior is normal. Judgment and thought content normal.    Right Knee Exam   Muscle Strength  The patient has normal right knee strength.  Tenderness  The patient is experiencing tenderness in the patella (PERIPATELLA SOFT TISSUE ).  Range of Motion  Extension: 5  Flexion: 110   Tests  Drawer:  Anterior - negative    Posterior - negative  Other  Erythema: absent Scars: present Sensation: normal Pulse: present Swelling: none Effusion: no effusion present   Left Knee Exam   Muscle Strength  The patient has normal left knee strength.  Tests  Drawer:  Anterior - negative     Posterior - negative  Other  Erythema: absent Scars: absent Sensation: normal Pulse: present Swelling: none Effusion: no effusion present        MEDICAL DECISION SECTION  Xrays were done at [facility]   My independent reading of xrays:   3 views of the right knee x-rays done at [facility] knee was done in [place]  This looks like a Stryker type knee it is noted to have a area of ostial lysis perhaps if not bone graft at the anterolateral tibial margin.  More likely to be ostial lysis with no subsidence of the prosthesis or graft femur is well seated without any evidence of loosening.  There is no evidence of loosening of the tibia  Overall alignment looks normal.   The patella seems to be centered as well.  Impression possible ostial lysis at the  antero-lateral tibia without loosening  Encounter Diagnoses  Name Primary?  . S/P total knee replacement, right   . Chronic pain of right knee Yes    PLAN: (Rx., injectx, surgery, frx, mri/ct) Knee exercises  Vitamin d and calcium catrate plus D   Fu prn   [email or web address] search completed    Meds ordered this encounter  Medications  . acetaminophen-codeine (TYLENOL #3) 300-30 MG tablet    Sig: Take 1 tablet by mouth every  6 (six) hours as needed for moderate pain.    Dispense:  90 tablet    Refill:  0    Fuller Canada, MD  01/16/2018 10:22 AM

## 2018-01-16 NOTE — Patient Instructions (Addendum)
Recommend caltrate with vitamin D and knee exercises and  Muscle cream   PLEASE READ THE PACKAGE INSTRUCTIONS BEFORE USING   Ben Gay arthritis cream  Icy hot vanishing gel  Aspercreme odor free  Myoflex Oderless pain reliever  Capzasin  Sportscreme  Max freeze

## 2018-03-01 DIAGNOSIS — Z0000 Encounter for general adult medical examination without abnormal findings: Secondary | ICD-10-CM | POA: Diagnosis not present

## 2018-03-01 DIAGNOSIS — R739 Hyperglycemia, unspecified: Secondary | ICD-10-CM | POA: Diagnosis not present

## 2018-03-01 DIAGNOSIS — M1990 Unspecified osteoarthritis, unspecified site: Secondary | ICD-10-CM | POA: Diagnosis not present

## 2018-03-01 DIAGNOSIS — Z0001 Encounter for general adult medical examination with abnormal findings: Secondary | ICD-10-CM | POA: Diagnosis not present

## 2018-03-01 DIAGNOSIS — J449 Chronic obstructive pulmonary disease, unspecified: Secondary | ICD-10-CM | POA: Diagnosis not present

## 2018-03-01 DIAGNOSIS — I10 Essential (primary) hypertension: Secondary | ICD-10-CM | POA: Diagnosis not present

## 2018-03-01 DIAGNOSIS — Z1331 Encounter for screening for depression: Secondary | ICD-10-CM | POA: Diagnosis not present

## 2018-03-01 DIAGNOSIS — E669 Obesity, unspecified: Secondary | ICD-10-CM | POA: Diagnosis not present

## 2018-03-01 DIAGNOSIS — Z1389 Encounter for screening for other disorder: Secondary | ICD-10-CM | POA: Diagnosis not present

## 2018-03-01 DIAGNOSIS — F3340 Major depressive disorder, recurrent, in remission, unspecified: Secondary | ICD-10-CM | POA: Diagnosis not present

## 2018-03-01 DIAGNOSIS — E785 Hyperlipidemia, unspecified: Secondary | ICD-10-CM | POA: Diagnosis not present

## 2018-03-12 ENCOUNTER — Other Ambulatory Visit (HOSPITAL_COMMUNITY): Payer: Self-pay | Admitting: Internal Medicine

## 2018-03-13 DIAGNOSIS — I10 Essential (primary) hypertension: Secondary | ICD-10-CM | POA: Diagnosis not present

## 2018-03-13 DIAGNOSIS — J449 Chronic obstructive pulmonary disease, unspecified: Secondary | ICD-10-CM | POA: Diagnosis not present

## 2018-03-13 DIAGNOSIS — T148XXA Other injury of unspecified body region, initial encounter: Secondary | ICD-10-CM | POA: Diagnosis not present

## 2018-04-04 ENCOUNTER — Other Ambulatory Visit: Payer: Self-pay | Admitting: Orthopedic Surgery

## 2018-04-09 NOTE — Telephone Encounter (Signed)
Patient is requesting Acetaminophen Codiene (Tylenol # 3) q 90  Sig: TAKE ONE TABLET BY MOUTH EVERY 8 HOURS AS NEEDED FOR MODERATE PAIN.  Patient states she uses [facility]  Patient has been given an appointment for 04/22/18

## 2018-04-15 ENCOUNTER — Other Ambulatory Visit: Payer: Self-pay | Admitting: Orthopedic Surgery

## 2018-04-15 MED ORDER — ACETAMINOPHEN-CODEINE #3 300-30 MG PO TABS: ORAL_TABLET | ORAL | 0 refills | Status: DC

## 2018-04-19 ENCOUNTER — Other Ambulatory Visit (HOSPITAL_COMMUNITY): Payer: Self-pay | Admitting: Internal Medicine

## 2018-04-19 DIAGNOSIS — Z1231 Encounter for screening mammogram for malignant neoplasm of breast: Secondary | ICD-10-CM

## 2018-04-22 ENCOUNTER — Ambulatory Visit: Payer: Medicare HMO | Admitting: Orthopedic Surgery

## 2018-05-02 ENCOUNTER — Ambulatory Visit (HOSPITAL_COMMUNITY): Payer: Medicare HMO

## 2018-05-02 DIAGNOSIS — M4316 Spondylolisthesis, lumbar region: Secondary | ICD-10-CM | POA: Diagnosis not present

## 2018-05-03 ENCOUNTER — Ambulatory Visit (HOSPITAL_COMMUNITY): Payer: Medicare HMO

## 2018-05-03 DIAGNOSIS — J441 Chronic obstructive pulmonary disease with (acute) exacerbation: Secondary | ICD-10-CM | POA: Diagnosis not present

## 2018-05-03 DIAGNOSIS — I10 Essential (primary) hypertension: Secondary | ICD-10-CM | POA: Diagnosis not present

## 2018-05-13 DIAGNOSIS — M5116 Intervertebral disc disorders with radiculopathy, lumbar region: Secondary | ICD-10-CM | POA: Diagnosis not present

## 2018-05-13 DIAGNOSIS — M5416 Radiculopathy, lumbar region: Secondary | ICD-10-CM | POA: Diagnosis not present

## 2018-05-20 ENCOUNTER — Ambulatory Visit (HOSPITAL_COMMUNITY)
Admission: RE | Admit: 2018-05-20 | Discharge: 2018-05-20 | Disposition: A | Discharge status: Home / Self Care | Payer: Medicare Other | Source: Ambulatory Visit | Attending: Internal Medicine | Admitting: Internal Medicine

## 2018-05-20 ENCOUNTER — Other Ambulatory Visit: Payer: Self-pay

## 2018-05-20 DIAGNOSIS — Z1231 Encounter for screening mammogram for malignant neoplasm of breast: Secondary | ICD-10-CM | POA: Insufficient documentation

## 2018-06-01 DIAGNOSIS — J449 Chronic obstructive pulmonary disease, unspecified: Secondary | ICD-10-CM | POA: Diagnosis not present

## 2018-06-01 DIAGNOSIS — I10 Essential (primary) hypertension: Secondary | ICD-10-CM | POA: Diagnosis not present

## 2018-07-02 DIAGNOSIS — I10 Essential (primary) hypertension: Secondary | ICD-10-CM | POA: Diagnosis not present

## 2018-07-02 DIAGNOSIS — J449 Chronic obstructive pulmonary disease, unspecified: Secondary | ICD-10-CM | POA: Diagnosis not present

## 2018-07-22 ENCOUNTER — Telehealth: Payer: Self-pay | Admitting: Orthopedic Surgery

## 2018-07-22 NOTE — Telephone Encounter (Signed)
Patient called to request appointment for right shoulder pain which has flared up again - states "hurts so bad" - said no injury.  Based on patient's medical issues including COPD, and due to Covid-19 protocol, may we schedule a virtual appointment this week? Or other recommendation?  Ph [phone]

## 2018-07-22 NOTE — Telephone Encounter (Signed)
virtual

## 2018-07-23 NOTE — Telephone Encounter (Signed)
Called back to patient to offer virtual visit appointment per Dr Mort Sawyers response. Left message to return call.

## 2018-07-23 NOTE — Telephone Encounter (Signed)
Patient returned call. Virtual appointment has been scheduled; aware.

## 2018-07-24 ENCOUNTER — Encounter: Payer: Self-pay | Admitting: Orthopedic Surgery

## 2018-07-24 ENCOUNTER — Ambulatory Visit (INDEPENDENT_AMBULATORY_CARE_PROVIDER_SITE_OTHER): Payer: Medicare Other | Admitting: Orthopedic Surgery

## 2018-07-24 ENCOUNTER — Other Ambulatory Visit: Payer: Self-pay

## 2018-07-24 DIAGNOSIS — M7541 Impingement syndrome of right shoulder: Secondary | ICD-10-CM | POA: Diagnosis not present

## 2018-07-24 MED ORDER — PREDNISONE 10 MG (48) PO TBPK: ORAL_TABLET | Freq: Every day | ORAL | 0 refills | Status: DC

## 2018-07-24 NOTE — Progress Notes (Signed)
Virtual Visit via Telephone Note  I connected with [name] on 07/24/18 at  3:50 PM EDT by telephone and verified that I am speaking with the correct person using two identifiers.  Location: Patient: home Provider: office   I discussed the limitations, risks, security and privacy concerns of performing an evaluation and management service by telephone and the availability of in person appointments. I also discussed with the patient that there may be a patient responsible charge related to this service. The patient expressed understanding and agreed to proceed.    I discussed the assessment and treatment plan with the patient. The patient was provided an opportunity to ask questions and all were answered. The patient agreed with the plan and demonstrated an understanding of the instructions.   The patient was advised to call back or seek an in-person evaluation if the symptoms worsen or if the condition fails to improve as anticipated.  I provided 8 minutes of non-face-to-face time during this encounter.   Fuller Canada, MD  Progress Note   Patient ID: Heather [name], female   DOB: [birth year], 78 y.o.   MRN: [id number]   Chief Complaint  Patient presents with  . Shoulder Pain    Right shoulder pain x25 week    78 year old female had a left partial shoulder replacement complains of new onset acute sharp right shoulder pain x1 week.  She says she was reaching into her nightstand while lying in the bed and it was difficult to open and close.  She did not feel a thing at that point when she woke up the next day she felt acute pain in the right shoulder.  The pain radiates from the shoulder to the wrist.  She did try some BenGay Tylenol arthritis and she is on Tylenol 3 for chronic pain but it did not relieve her right shoulder symptoms  She cannot sleep at night the pain causes her to wake up     Review of Systems  Musculoskeletal: Negative for neck pain.       Denies neck  pain but says when she rotates it does pop it does not increase the pain in her arm  Neurological: Positive for weakness.       Weakness right hand opening jars may or may not be related   Current Meds  Medication Sig  . acetaminophen-codeine (TYLENOL #3) 300-30 MG tablet TAKE ONE TABLET BY MOUTH EVERY 6 HOURS AS NEEDED FOR MODERATE PAIN.  [name] amLODipine (NORVASC) 5 MG tablet Take 1 tablet by mouth daily.  [name] aspirin EC 81 MG tablet Take 81 mg by mouth every morning.  [name] atorvastatin (LIPITOR) 10 MG tablet   . cholecalciferol (VITAMIN D) 1000 UNITS tablet Take 1,000 Units by mouth at bedtime.  . citalopram (CELEXA) 20 MG tablet Take 20 mg by mouth every morning.  . furosemide (LASIX) 40 MG tablet Take 20 mg by mouth daily.   [name] lisinopril (PRINIVIL,ZESTRIL) 20 MG tablet Take 20 mg by mouth daily.  [name] losartan (COZAAR) 100 MG tablet Take 1 tablet by mouth daily.  . metoprolol (LOPRESSOR) 100 MG tablet Take 1 tablet by mouth daily.  [name] omeprazole (PRILOSEC) 20 MG capsule Take 20 mg by mouth daily.  [name] zolpidem (AMBIEN) 10 MG tablet Take 10 mg by mouth at bedtime as needed for sleep.    Past Medical History:  Diagnosis Date  . Anemia   . Anxiety   . Arthritis   . COPD (chronic obstructive pulmonary disease) (  [facility])   . Depression   . GERD (gastroesophageal reflux disease)   . Glaucoma   . Gout   . HTN (hypertension)      Allergies  Allergen Reactions  . Lovastatin Swelling    Patient states that her tongue swells and she gets a tingling feeling all over.     There were no vitals taken for this visit.   Physical Exam Ortho Exam Virtual visit physical and orthopedic exam deferred   MEDICAL DECISION MAKING   Imaging:  No imaging although image would be indicated   Encounter Diagnosis  Name Primary?  . Impingement syndrome of right shoulder Yes     PLAN: (RX., injection, surgery,frx,mri/ct, XR 2 body ares) Recommend Sterapred Dosepak for 12 days if no improvement bring  the patient in for x-rays and possible injection  Meds ordered this encounter  Medications  . predniSONE (STERAPRED UNI-PAK 48 TAB) 10 MG (48) TBPK tablet    Sig: Take by mouth daily. 10 mg double strength 12-day Dosepak as directed    Dispense:  48 tablet    Refill:  0   4:10 PM 07/24/2018

## 2018-08-01 DIAGNOSIS — J449 Chronic obstructive pulmonary disease, unspecified: Secondary | ICD-10-CM | POA: Diagnosis not present

## 2018-08-01 DIAGNOSIS — K219 Gastro-esophageal reflux disease without esophagitis: Secondary | ICD-10-CM | POA: Diagnosis not present

## 2018-08-28 DIAGNOSIS — Z0001 Encounter for general adult medical examination with abnormal findings: Secondary | ICD-10-CM | POA: Diagnosis not present

## 2018-08-28 DIAGNOSIS — Z1389 Encounter for screening for other disorder: Secondary | ICD-10-CM | POA: Diagnosis not present

## 2018-08-28 DIAGNOSIS — E785 Hyperlipidemia, unspecified: Secondary | ICD-10-CM | POA: Diagnosis not present

## 2018-08-28 DIAGNOSIS — I10 Essential (primary) hypertension: Secondary | ICD-10-CM | POA: Diagnosis not present

## 2018-08-28 DIAGNOSIS — J449 Chronic obstructive pulmonary disease, unspecified: Secondary | ICD-10-CM | POA: Diagnosis not present

## 2018-08-28 DIAGNOSIS — R739 Hyperglycemia, unspecified: Secondary | ICD-10-CM | POA: Diagnosis not present

## 2018-09-17 DIAGNOSIS — M542 Cervicalgia: Secondary | ICD-10-CM | POA: Diagnosis not present

## 2018-09-17 DIAGNOSIS — M5416 Radiculopathy, lumbar region: Secondary | ICD-10-CM | POA: Diagnosis not present

## 2018-09-17 DIAGNOSIS — G5603 Carpal tunnel syndrome, bilateral upper limbs: Secondary | ICD-10-CM | POA: Diagnosis not present

## 2018-09-17 DIAGNOSIS — M130 Polyarthritis, unspecified: Secondary | ICD-10-CM | POA: Diagnosis not present

## 2018-09-17 DIAGNOSIS — G894 Chronic pain syndrome: Secondary | ICD-10-CM | POA: Diagnosis not present

## 2018-09-27 DIAGNOSIS — J449 Chronic obstructive pulmonary disease, unspecified: Secondary | ICD-10-CM | POA: Diagnosis not present

## 2018-09-27 DIAGNOSIS — E785 Hyperlipidemia, unspecified: Secondary | ICD-10-CM | POA: Diagnosis not present

## 2018-10-13 IMAGING — MG 2D DIGITAL SCREENING BILATERAL MAMMOGRAM WITH 3D TOMO WITH CAD
8 series · 8 of 24 positions shown · non-contrast
Comparison: Previous exam(s).

CLINICAL DATA: Screening.

EXAM:
2D DIGITAL SCREENING BILATERAL MAMMOGRAM WITH 3D TOMO WITH CAD

[L CC]
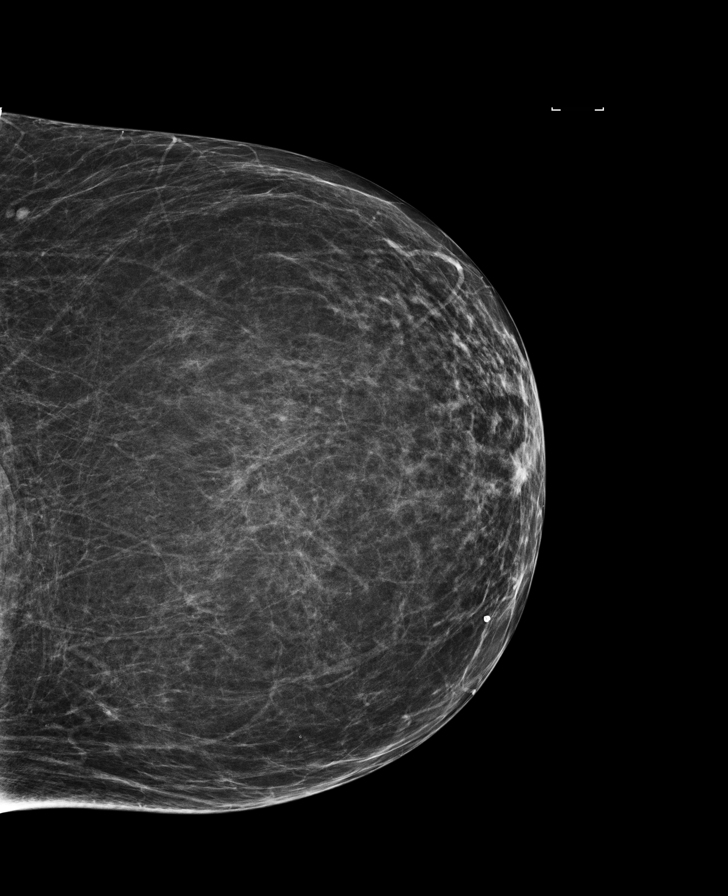

[R CC]
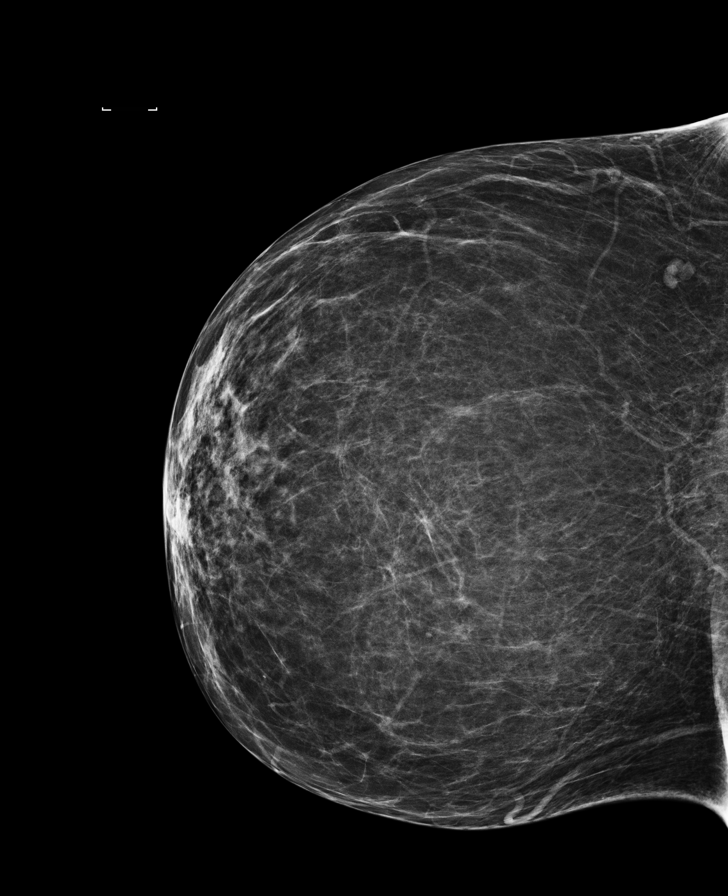

[L MLO]
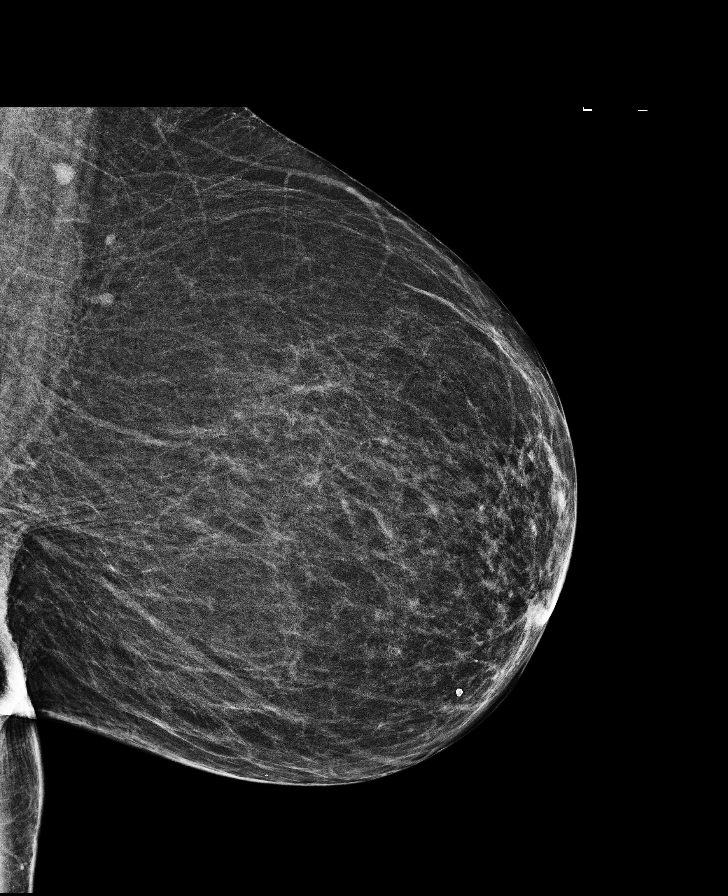

[R MLO]
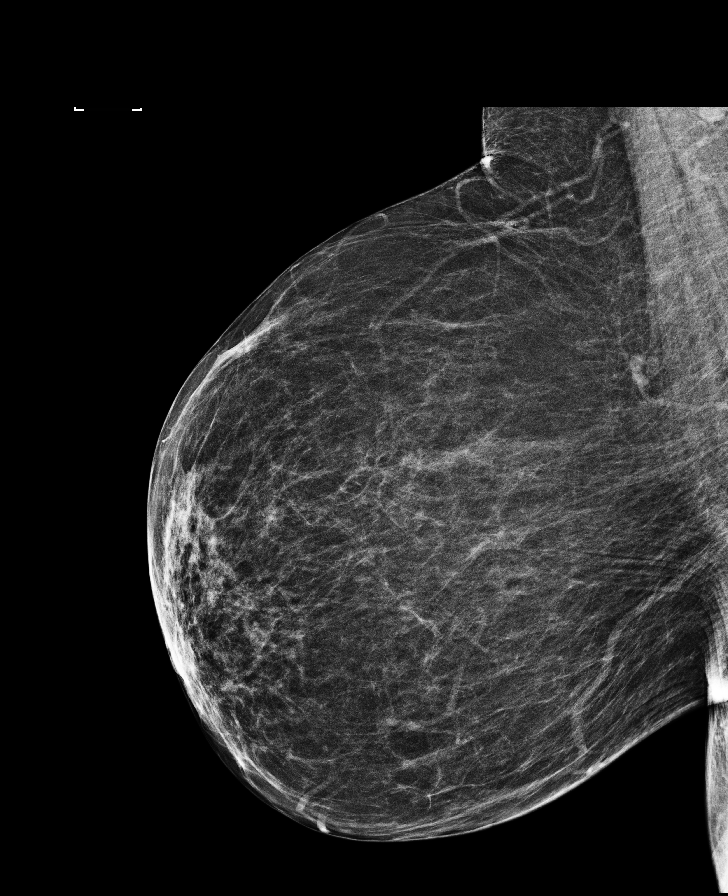

[R MLO tomo · tomo slice 29/58.0]
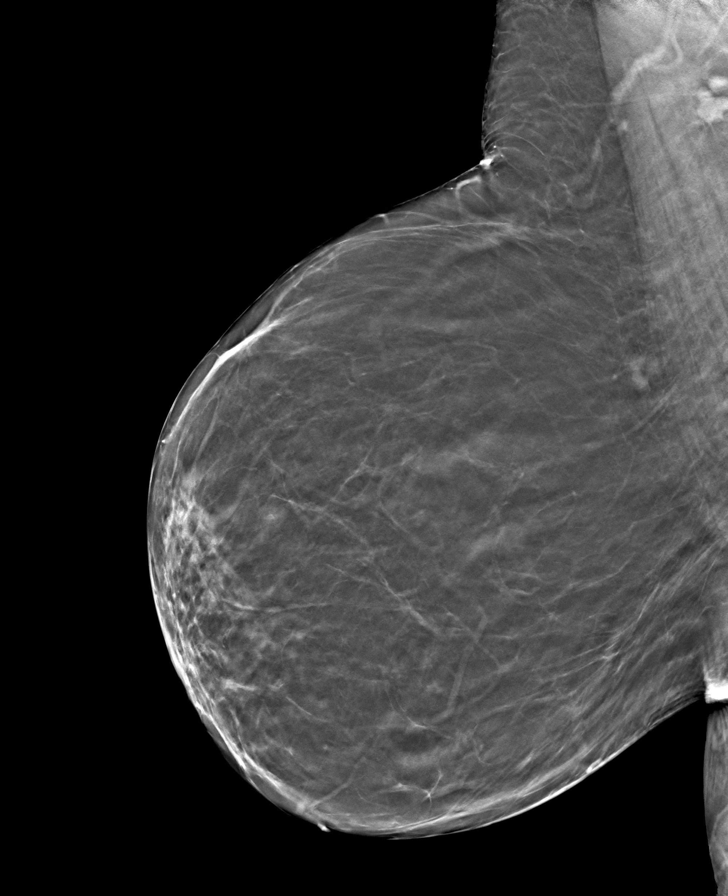

[L CC tomo · tomo slice 27/52.0]
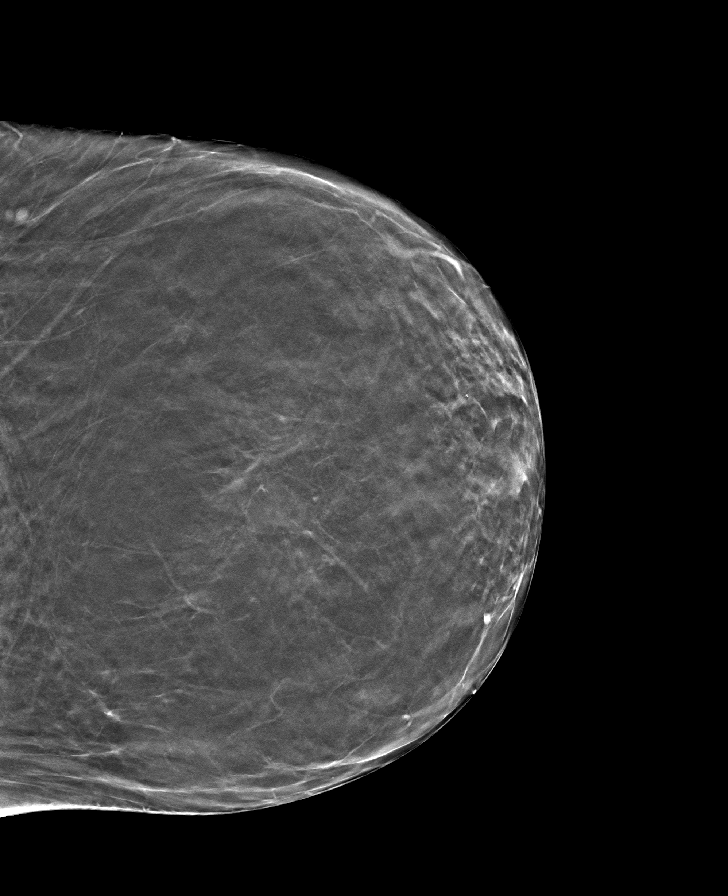

[L MLO tomo · tomo slice 29/57.0]
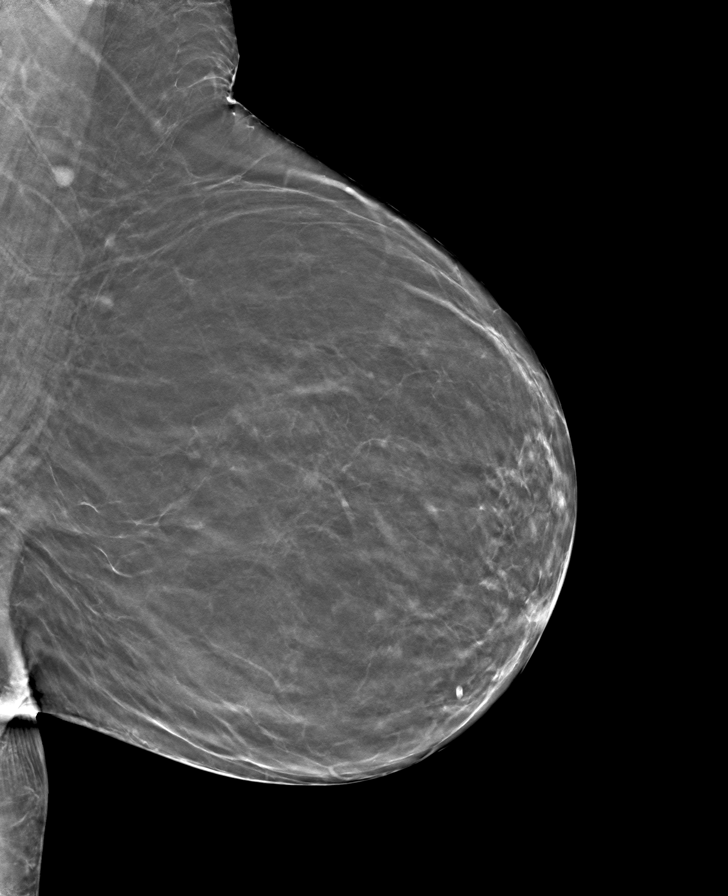

[R CC tomo · tomo slice 27/53.0]
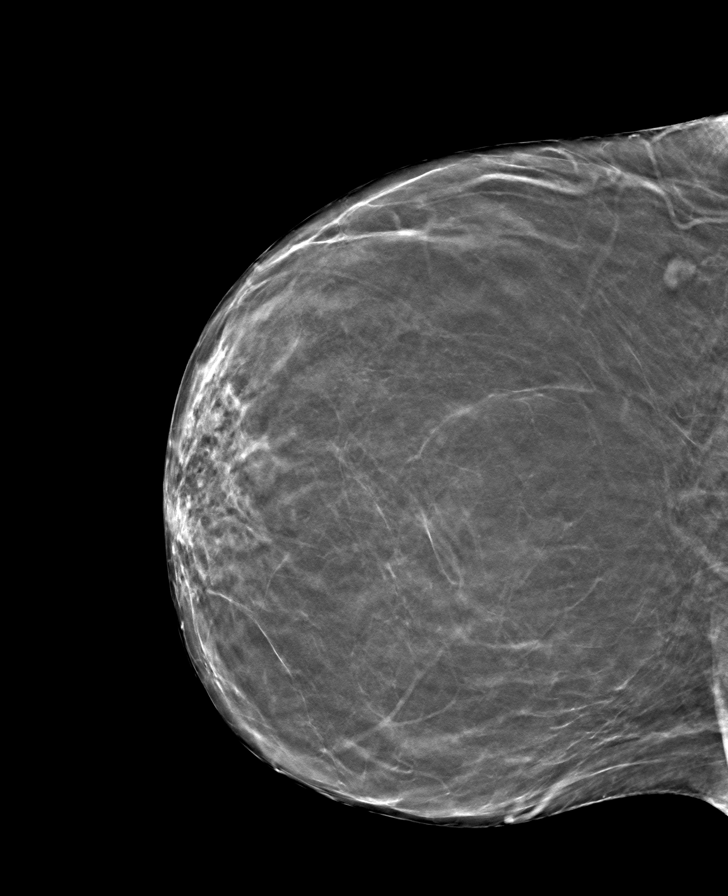

[8 of 24 positions shown; findings below may reference images not displayed]

ACR Breast Density Category b: There are scattered areas of
fibroglandular density.
FINDINGS: There are no findings suspicious for malignancy. Images were
processed with CAD.
IMPRESSION: No mammographic evidence of malignancy. A result letter of this
screening mammogram will be mailed directly to the patient.

RECOMMENDATION:
Screening mammogram in one year. (Code:GE-P-ZS0)

BI-RADS CATEGORY  1: Negative.

## 2018-10-17 DIAGNOSIS — R5381 Other malaise: Secondary | ICD-10-CM | POA: Diagnosis not present

## 2018-10-17 DIAGNOSIS — I10 Essential (primary) hypertension: Secondary | ICD-10-CM | POA: Diagnosis not present

## 2018-10-17 DIAGNOSIS — R110 Nausea: Secondary | ICD-10-CM | POA: Diagnosis not present

## 2018-10-19 ENCOUNTER — Emergency Department (HOSPITAL_COMMUNITY): Payer: Medicare Other

## 2018-10-19 ENCOUNTER — Other Ambulatory Visit: Payer: Self-pay

## 2018-10-19 ENCOUNTER — Encounter (HOSPITAL_COMMUNITY): Payer: Self-pay | Admitting: Emergency Medicine

## 2018-10-19 ENCOUNTER — Emergency Department (HOSPITAL_COMMUNITY)
Admission: EM | Admit: 2018-10-19 | Discharge: 2018-10-19 | Disposition: A | Discharge status: Home / Self Care | Payer: Medicare Other | Attending: Emergency Medicine | Admitting: Emergency Medicine

## 2018-10-19 DIAGNOSIS — J449 Chronic obstructive pulmonary disease, unspecified: Secondary | ICD-10-CM | POA: Insufficient documentation

## 2018-10-19 DIAGNOSIS — Z7982 Long term (current) use of aspirin: Secondary | ICD-10-CM | POA: Insufficient documentation

## 2018-10-19 DIAGNOSIS — M109 Gout, unspecified: Secondary | ICD-10-CM | POA: Insufficient documentation

## 2018-10-19 DIAGNOSIS — Z96643 Presence of artificial hip joint, bilateral: Secondary | ICD-10-CM | POA: Insufficient documentation

## 2018-10-19 DIAGNOSIS — M79641 Pain in right hand: Secondary | ICD-10-CM | POA: Diagnosis not present

## 2018-10-19 DIAGNOSIS — Z96651 Presence of right artificial knee joint: Secondary | ICD-10-CM | POA: Insufficient documentation

## 2018-10-19 DIAGNOSIS — M25531 Pain in right wrist: Secondary | ICD-10-CM | POA: Diagnosis not present

## 2018-10-19 DIAGNOSIS — Z79899 Other long term (current) drug therapy: Secondary | ICD-10-CM | POA: Diagnosis not present

## 2018-10-19 DIAGNOSIS — I10 Essential (primary) hypertension: Secondary | ICD-10-CM | POA: Insufficient documentation

## 2018-10-19 DIAGNOSIS — Z87891 Personal history of nicotine dependence: Secondary | ICD-10-CM | POA: Insufficient documentation

## 2018-10-19 LAB — CBC WITH DIFFERENTIAL/PLATELET
Abs Immature Granulocytes: 0.03 10*3/uL (ref 0.00–0.07)
Basophils Absolute: 0.1 10*3/uL (ref 0.0–0.1)
Basophils Relative: 1 %
Eosinophils Absolute: 0.6 10*3/uL — ABNORMAL HIGH (ref 0.0–0.5)
Eosinophils Relative: 5 %
HCT: 43.9 % (ref 36.0–46.0)
Hemoglobin: 13.8 g/dL (ref 12.0–15.0)
Immature Granulocytes: 0 %
Lymphocytes Relative: 9 %
Lymphs Abs: 1 10*3/uL (ref 0.7–4.0)
MCH: 29.4 pg (ref 26.0–34.0)
MCHC: 31.4 g/dL (ref 30.0–36.0)
MCV: 93.6 fL (ref 80.0–100.0)
Monocytes Absolute: 1.1 10*3/uL — ABNORMAL HIGH (ref 0.1–1.0)
Monocytes Relative: 11 %
Neutro Abs: 8 10*3/uL — ABNORMAL HIGH (ref 1.7–7.7)
Neutrophils Relative %: 74 %
Platelets: 257 10*3/uL (ref 150–400)
RBC: 4.69 MIL/uL (ref 3.87–5.11)
RDW: 12.7 % (ref 11.5–15.5)
WBC: 10.7 10*3/uL — ABNORMAL HIGH (ref 4.0–10.5)
nRBC: 0 % (ref 0.0–0.2)

## 2018-10-19 LAB — BASIC METABOLIC PANEL
Anion gap: 13 (ref 5–15)
BUN: 17 mg/dL (ref 8–23)
CO2: 23 mmol/L (ref 22–32)
Calcium: 9.6 mg/dL (ref 8.9–10.3)
Chloride: 102 mmol/L (ref 98–111)
Creatinine, Ser: 0.91 mg/dL (ref 0.44–1.00)
GFR calc Af Amer: >60 mL/min (ref >60)
GFR calc non Af Amer: >60 mL/min (ref >60)
Glucose, Bld: 115 mg/dL — ABNORMAL HIGH (ref 70–99)
Potassium: 3.7 mmol/L (ref 3.5–5.1)
Sodium: 138 mmol/L (ref 135–145)

## 2018-10-19 MED ORDER — PREDNISONE 10 MG PO TABS: ORAL_TABLET | ORAL | 0 refills | Status: DC

## 2018-10-19 MED ORDER — HYDROCODONE-ACETAMINOPHEN 5-325 MG PO TABS
1.0000 | ORAL_TABLET | Freq: Once | ORAL | Status: AC
  Administered 2018-10-19: 1 via ORAL
  Filled 2018-10-19: qty 1

## 2018-10-19 MED ORDER — PREDNISONE 50 MG PO TABS
60.0000 mg | ORAL_TABLET | Freq: Once | ORAL | Status: AC
  Administered 2018-10-19: 60 mg via ORAL
  Filled 2018-10-19: qty 1

## 2018-10-19 NOTE — ED Provider Notes (Signed)
[facility] EMERGENCY DEPARTMENT Provider Note   CSN: [id number] Arrival date & time: 10/19/18  0846    History   Chief Complaint Chief Complaint  Patient presents with  . Hand Pain    right     HPI Heather [name] is a 78 y.o. female with a history as outlined below, most significant for arthritis, COPD, GERD, hypertension and a history of gout which predominantly occurs in her great toe, developed pain and swelling of her right hand which radiates into her wrist and started 3 days ago.  She woke with mild symptoms which have become progressively worse.  Her pain is constant, worse with palpation and movement.  She denies injury, overuse or falls.  She states her symptoms may somewhat feel like gout but states has never had this occur in her hand.  Patient is right-handed.  She has had no treatments prior to arrival and is not currently on any chronic gout medication.  Denies fevers or chills.     The history is provided by the patient.    Past Medical History:  Diagnosis Date  . Anemia   . Anxiety   . Arthritis   . COPD (chronic obstructive pulmonary disease) (HCC)   . Depression   . GERD (gastroesophageal reflux disease)   . Glaucoma   . Gout   . HTN (hypertension)     Patient Active Problem List   Diagnosis Date Noted  . HTN (hypertension) 04/17/2014  . Dry eyes 04/14/2014  . Bleeding from the nose 04/01/2014  . Renal insufficiency 04/01/2014  . Rotator cuff arthropathy   . Muscle weakness (generalized) 05/26/2013  . Pain in joint, shoulder region 05/26/2013  . Rotator cuff syndrome 05/22/2013    Past Surgical History:  Procedure Laterality Date  . ABDOMINAL HYSTERECTOMY    . BACK SURGERY     lumbar-disc  . BUNIONECTOMY Bilateral   . FOOT SURGERY Left    repair of "kissing Cousins"  . JOINT REPLACEMENT Bilateral    hip   . JOINT REPLACEMENT Right 2010 or 2012   knee  . KNEE SURGERY    . SHOULDER ARTHROSCOPY Right    [place]  . SHOULDER HEMI-ARTHROPLASTY Left 03/25/2014   Procedure: LEFT SHOULDER HEMI-ARTHROPLASTY;  Surgeon: Vickki HearingStanley E Harrison, MD;  Location: [facility];  Service: Orthopedics;  Laterality: Left;     OB History   No obstetric history on file.      Home Medications    Prior to Admission medications   Medication Sig Start Date End Date Taking? Authorizing Provider  acetaminophen-codeine (TYLENOL #3) 300-30 MG tablet TAKE ONE TABLET BY MOUTH EVERY 6 HOURS AS NEEDED FOR MODERATE PAIN. 04/15/18   Vickki HearingHarrison, Stanley E, MD  amLODipine (NORVASC) 5 MG tablet Take 1 tablet by mouth daily. 05/18/17   [provider]  aspirin EC 81 MG tablet Take 81 mg by mouth every morning.    [provider]  atorvastatin (LIPITOR) 10 MG tablet  07/14/17   [provider]  Calcium-Magnesium-Zinc [id number] MG TABS Take 1 capsule by mouth daily.    [provider]  cetirizine (ZYRTEC) 10 MG tablet Take 10 mg by mouth every morning.    [provider]  cholecalciferol (VITAMIN D) 1000 UNITS tablet Take 1,000 Units by mouth at bedtime.    [provider]  citalopram (CELEXA) 20 MG tablet Take 20 mg by mouth every morning.    [provider]  colchicine 0.6  MG tablet Take 1 tablet (0.6 mg total) by mouth daily. 06/23/17   Benjiman CorePickering, Nathan, MD  furosemide (LASIX) 40 MG tablet Take 20 mg by mouth daily.     [provider]  INCRUSE ELLIPTA 62.5 MCG/INH AEPB Inhale 1 puff into the lungs daily.  04/19/17   [provider]  lisinopril (PRINIVIL,ZESTRIL) 20 MG tablet Take 20 mg by mouth daily.    [provider]  losartan (COZAAR) 100 MG tablet Take 1 tablet by mouth daily. 06/14/17   [provider]  metoprolol (LOPRESSOR) 100 MG tablet Take 1 tablet by mouth daily. 01/24/14   [provider]  nitroGLYCERIN (NITROSTAT) 0.4 MG SL tablet Place 0.4 mg under the tongue every 5 (five) minutes as needed for chest pain.    [provider]  omeprazole (PRILOSEC) 20 MG capsule Take 20 mg by mouth daily.    [provider]  potassium chloride (MICRO-K) 10 MEQ CR capsule Take 1 capsule by mouth 2 (two) times daily. 02/03/14   [provider]  predniSONE (DELTASONE) 10 MG tablet Take 6 tablets day one, 5 tablets day two, 4 tablets day three, 3 tablets day four, 2 tablets day five, then 1 tablet day six 10/19/18   Rowe Warman, Raynelle FanningJulie, PA-C  PROAIR HFA 108 (90 BASE) MCG/ACT inhaler Inhale 2 puffs into the lungs every 6 (six) hours as needed for wheezing or shortness of breath.  02/15/14   [provider]  zolpidem (AMBIEN) 10 MG tablet Take 10 mg by mouth at bedtime as needed for sleep.    [provider]    Family History Family History  Problem Relation Age of Onset  . High blood pressure Mother   . Aneurysm Father   . Diabetes Brother   . Pancreatitis Brother     Social History Social History   Tobacco Use  . Smoking status: Former Smoker    Packs/day: 1.00    Years: 40.00    Pack years: 40.00    Types: Cigarettes    Quit date: 03/20/1985    Years since quitting: 33.6  . Smokeless tobacco: Never Used  Substance Use Topics  . Alcohol use: No  . Drug use: No     Allergies   Lovastatin   Review of Systems Review of Systems  Constitutional: Negative for chills and fever.  HENT: Negative for congestion.   Eyes: Negative.   Respiratory: Negative for chest tightness and shortness of breath.   Cardiovascular: Negative for chest pain.  Gastrointestinal: Negative for abdominal pain, nausea and vomiting.  Genitourinary: Negative.   Musculoskeletal: Positive for arthralgias and joint swelling. Negative for neck pain.  Skin: Negative.  Negative for rash and wound.  Neurological: Negative for dizziness, light-headedness, numbness and headaches.  Psychiatric/Behavioral: Negative.      Physical Exam Updated Vital Signs BP (!) 161/90   Pulse 81   Temp 98 F (36.7 C)    Resp 18   Ht 5\' 2"  (1.575 m)   Wt 84.4 kg   SpO2 96%   BMI 34.02 kg/m   Physical Exam Constitutional:      Appearance: She is well-developed.  HENT:     Head: Atraumatic.  Neck:     Musculoskeletal: Neck supple.  Cardiovascular:     Rate and Rhythm: Normal rate.     Pulses:          Radial pulses are 2+ on the right side and 2+ on the left side.  Comments: Pulses equal bilaterally Pulmonary:     Effort: Pulmonary effort is normal.  Musculoskeletal:        General: Swelling and tenderness present.     Right hand: She exhibits tenderness and swelling. She exhibits normal capillary refill and no deformity. Normal sensation noted.     Comments: Tender to palpation with mild edema and increased warmth appreciated across her right dorsal hand and into the wrist.  There is no red streaking, no changes in skin coloration, although her skin at the site has increased warmth.  Skin is intact, no induration.  She is nontender at her forearm or elbow, no palm involvement.  She does have difficulty with pronation and supination which causes increased hand and wrist pain.  Skin:    General: Skin is warm and dry.  Neurological:     Mental Status: She is alert.     Sensory: No sensory deficit.     Deep Tendon Reflexes: Reflexes normal.      ED Treatments / Results  Labs (all labs ordered are listed, but only abnormal results are displayed) Labs Reviewed  CBC WITH DIFFERENTIAL/PLATELET - Abnormal; Notable for the following components:      Result Value   WBC 10.7 (*)    Neutro Abs 8.0 (*)    Monocytes Absolute 1.1 (*)    Eosinophils Absolute 0.6 (*)    All other components within normal limits  BASIC METABOLIC PANEL - Abnormal; Notable for the following components:   Glucose, Bld 115 (*)    All other components within normal limits    EKG None  Radiology Dg Wrist Complete Right  Result Date: 10/19/2018 CLINICAL DATA:  Redness and pain at the right wrist. History of gout.  EXAM: RIGHT WRIST - COMPLETE 3+ VIEW COMPARISON:  Right hand 10/19/2018 and right wrist 09/14/2016 FINDINGS: No definite fracture or dislocation involving the right wrist. Subtle lucency at the ulnar styloid without definite fracture. There is mild soft tissue swelling at the wrist. Subtle lucency involving the proximal aspect of the scaphoid bone and the scapholunate interval. Cannot exclude mild erosive changes in this area. IMPRESSION: Subtle areas of lucency at the scapholunate interval and the proximal pole the scaphoid. Findings could represent mild erosive changes based on history of gout. Negative for fracture or dislocation. Electronically Signed   By: Richarda Overlie M.D.   On: 10/19/2018 10:08   Dg Hand Complete Right  Result Date: 10/19/2018 CLINICAL DATA:  History of gout.  Right hand pain. EXAM: RIGHT HAND - COMPLETE 3+ VIEW COMPARISON:  Right wrist 10/19/2018.  09/14/2016 FINDINGS: Subtle areas of lucency involving the proximal aspect of the scaphoid bone and the adjacent lunate. Negative for fracture or dislocation in the hand. Slight flattening of the ulnar styloid compared to the prior wrist examination from 2018. IMPRESSION: Subtle areas of lucency involving the proximal scaphoid and the adjacent lunate. There is also mild flattening and irregularity of the ulnar styloid which is new. These findings could represent subtle erosive changes related to gout. Electronically Signed   By: Richarda Overlie M.D.   On: 10/19/2018 10:14    Procedures Procedures (including critical care time)  Medications Ordered in ED Medications  predniSONE (DELTASONE) tablet 60 mg (60 mg Oral Given 10/19/18 1010)  HYDROcodone-acetaminophen (NORCO/VICODIN) 5-325 MG per tablet 1 tablet (1 tablet Oral Given 10/19/18 1010)     Initial Impression / Assessment and Plan / ED Course  I have reviewed the triage vital signs and the nursing  notes.  Pertinent labs & imaging results that were available during my care of the  patient were reviewed by me and considered in my medical decision making (see chart for details).        Exam, history, labs and x-rays all suggest that this is a gouty flare of the right hand.  She was given a dose of prednisone here and was prescribed a taper for continued home use.  Discussed other home treatments for alleviating this flare, advise follow-up with her PCP for recheck if not improved over the next week.  Per the [facility] she is chronically prescribed tramadol, advised to continue this medication if needed for pain, may add Tylenol if needed.  Patient was seen by Dr. Rogene Houston during this ED visit.  Final Clinical Impressions(s) / ED Diagnoses   Final diagnoses:  Acute gout of right hand, unspecified cause    ED Discharge Orders         Ordered    predniSONE (DELTASONE) 10 MG tablet     10/19/18 1104           Evalee Jefferson, PA-C 10/19/18 1107    Fredia Sorrow, MD 10/20/18 [phone]

## 2018-10-19 NOTE — ED Provider Notes (Signed)
Medical screening examination/treatment/procedure(s) were conducted as a shared visit with non-physician practitioner(s) and myself.  I personally evaluated the patient during the encounter.     Patient seen by me along with physician assistant.  Patient with complaint of right wrist and hand pain since Wednesday.  Patient has a history of gout but usually occurs in her feet.  This is erythematous increased warmth some swelling to the hand itself good cap refill to the fingertips.  Radial pulses 2+.  Clinically the highly suspicious of an inflammatory arthritis making gout very probable.  We will see what we get on labs and chest x-ray.  Patient is afebrile here.  No upper respiratory symptoms.  Most likely will be gout related and will be treated symptomatically.  Steroids would be preferable if no contraindication.   Fredia Sorrow, MD 10/19/18 1014

## 2018-10-19 NOTE — ED Triage Notes (Signed)
History of gout.  C/o pain to right hand, rating pain 10/10.  usually get gout in great toes.

## 2018-10-19 NOTE — Discharge Instructions (Addendum)
Elevate your hand and use a gentle heating pad or warm towels which can help relieve the pain in your hand which we suspect is a flare of your gout.  You have been given a prednisone dose while here today and will be prescribed additional prednisone, take your next dose of this medication tomorrow morning.  Use your home tramadol as prescribed by your primary doctor if needed for pain relief.  You may also add Tylenol which can also help relieve pain symptoms.  Your lab tests and x-rays today suggest that this is gout in your hand.

## 2018-10-23 ENCOUNTER — Other Ambulatory Visit: Payer: Self-pay | Admitting: *Deleted

## 2018-10-23 DIAGNOSIS — M1000 Idiopathic gout, unspecified site: Secondary | ICD-10-CM | POA: Diagnosis not present

## 2018-10-23 DIAGNOSIS — I10 Essential (primary) hypertension: Secondary | ICD-10-CM | POA: Diagnosis not present

## 2018-10-23 DIAGNOSIS — M109 Gout, unspecified: Secondary | ICD-10-CM

## 2018-10-23 DIAGNOSIS — J449 Chronic obstructive pulmonary disease, unspecified: Secondary | ICD-10-CM | POA: Diagnosis not present

## 2018-10-23 HISTORY — DX: Gout, unspecified: M10.9

## 2018-10-23 NOTE — Patient Outreach (Addendum)
[facility]) Care Management  10/23/2018  HENRINE HAYTER Feb 21, 1941 [id number]   Telephone Screen  Referral Date:10/22/18 Referral Source: [facility] UM referral Referral [facility], phone [phone]** would like assistance with Incruse inhaler co pay in donut hole -:A bellamy  Insurance:united healthcare medicare  admission x 0  ED visit in the last 6 months x 1 on 10/19/18   Outreach attempt # 1 successful at the home number  Patient is able to verify HIPAA, DOB and address Reviewed and addressed [facility] UM referral to [facility] with patient   She confirms being in the donut hole and needing assist with medications  She states she is doing well today but has recently a episode of gout in her right hand (usually in her toes) She reports resolving symptoms. She states she believes her trigger is hereditary and not necessarily  tried cherry juice ran out  Get cherry juice online   [facility] RN CM assessed Mrs Ward for possible services from [place], SW, NP and health coaches. She denies the need for other services at this time  She reports she is following Covid precautions but needs some more masks. CM discussed having some sent to her from [facility] and also discounted convenient stores near her that have masks also She voiced understanding    Social: Mrs Thornley is a 78 year old divorced female who lives alone. She has support of her son and sister. Her son is visiting her today from [place]  She drives but when she has episodes of gout her son or sister assists with driving to medical appointments  She reports some related to covid pandemic but speak with family and friends situational  Conditions: HTN, HLD, allergic rhinitis, gout , GERD, rotator cuff syndrome/arthropathy, renal insufficiency, muscle weakness, pain in joint of shoulder, bleeding from the nose, dry eyes   DME: BP cuff   Medications:  cost concern with Incruse mainly. She report the last cost was $260+ for a 3  month supply She has obtained Incruse via [place] healthcare's mail order services [employer]  Locally she has used [employer] because of the delivery services   She does not recall getting samples of Incruse  She reports she did get forms for social security and completed but did not get assistance   Dr Legrand Rams manages her care and prescriptions She has recently purchased medicines    Appointments: Saw Dr Legrand Rams today 10/23/18  Other providers are [facility] [place] and orthopedic Aline Brochure   Advance Directives: Mrs Ward requests to be sent advance directive forms She reports she believes she can complete them herself   Consent: [facility] RN CM reviewed [facility] services with patient. Patient gave verbal consent for services [facility] telephonic RN CM.   Plan: [facility] RN CM will refer Mrs Ward to [place] for medication assistance with Incruse [facility] RN CM will refer Mrs Ward to [facility] SW to get SW to send her the advance directive forms for her to complete   Pt encouraged to return a call to [place] CM prn  [facility] RN CM sent a successful outreach letter as discussed with [facility] brochure enclosed for review and information on the alternative transportation for united healthcare benefit [facility]  [facility] RN CM sent a educational letter with EMMI materials for gout, Low purine diet and Lifestyle changes to manage gout.   [facility] RN CM requested assist from [place] to have patient sent 4 masks  Routed  note to MD   Cala Bradford L. Noelle Penner, RN, BSN, CCM [facility] Telephonic Care Management Care Coordinator Office number [phone] Mobile number [phone]  Main [facility] number [phone] Fax number [phone]

## 2018-10-24 ENCOUNTER — Encounter: Payer: Self-pay | Admitting: *Deleted

## 2018-10-24 DIAGNOSIS — M109 Gout, unspecified: Secondary | ICD-10-CM | POA: Insufficient documentation

## 2018-10-25 ENCOUNTER — Other Ambulatory Visit: Payer: Self-pay

## 2018-10-25 NOTE — Patient Outreach (Signed)
[facility]) Care Management  10/25/2018  Heather [name] 1940/08/27 [id number]   Successful outreach to patient regarding social work referral for assistance with Advance Directives.  Discussed HC POA and Living Will.  Mailed Advance Directive EMMI and packet.  Will follow up within the next two weeks to ensure receipt of documentation and review further if needed.  Ronn Melena, BSW Social Worker [phone]

## 2018-10-28 DIAGNOSIS — M545 Low back pain: Secondary | ICD-10-CM | POA: Diagnosis not present

## 2018-10-28 DIAGNOSIS — M25519 Pain in unspecified shoulder: Secondary | ICD-10-CM | POA: Diagnosis not present

## 2018-10-28 DIAGNOSIS — M542 Cervicalgia: Secondary | ICD-10-CM | POA: Diagnosis not present

## 2018-10-28 DIAGNOSIS — G5603 Carpal tunnel syndrome, bilateral upper limbs: Secondary | ICD-10-CM | POA: Diagnosis not present

## 2018-10-31 ENCOUNTER — Other Ambulatory Visit: Payer: Self-pay | Admitting: Pharmacist

## 2018-10-31 NOTE — Patient Outreach (Signed)
[facility]) Care Management  [place]  10/31/2018  Heather [name] 1940-04-08 [id number]   Reason for referral: Medication Assistance  Referral source: [facility] Social Worker Current insurance: [employer]   Outreach:  Unsuccessful telephone call attempt #1 to patient.   HIPAA compliant voicemail left requesting a return call  Plan:  -I will make another outreach attempt to patient within 3-4 business days.    Regina Eck, PharmD, [place]  [phone]

## 2018-11-05 ENCOUNTER — Ambulatory Visit: Payer: Self-pay | Admitting: Pharmacist

## 2018-11-05 ENCOUNTER — Other Ambulatory Visit: Payer: Self-pay | Admitting: Pharmacist

## 2018-11-08 ENCOUNTER — Other Ambulatory Visit: Payer: Self-pay

## 2018-11-08 NOTE — Patient Outreach (Signed)
[facility]) Care Management  11/08/2018  Heather [name] 10/15/1940 [id number]   Successful follow up call to patient to ensure receipt of Advance Directives.  Patient did receive documentation and has reviewed it.  She denied the need for further assistance with completion of documents.  Closing case but did encourage her to call if additional needs arise.  Ronn Melena, BSW Social Worker [phone]

## 2018-11-12 DIAGNOSIS — I10 Essential (primary) hypertension: Secondary | ICD-10-CM | POA: Diagnosis not present

## 2018-11-12 DIAGNOSIS — L259 Unspecified contact dermatitis, unspecified cause: Secondary | ICD-10-CM | POA: Diagnosis not present

## 2018-11-12 DIAGNOSIS — J449 Chronic obstructive pulmonary disease, unspecified: Secondary | ICD-10-CM | POA: Diagnosis not present

## 2018-11-12 DIAGNOSIS — Z23 Encounter for immunization: Secondary | ICD-10-CM | POA: Diagnosis not present

## 2018-11-14 NOTE — Patient Outreach (Signed)
[name]) Care Management  [place]   11/05/2018  ARRABELLA WESTERMAN November 16, 1940 [id number]  Reason for referral: Medication Assistance with inhalers  Referral source: [facility] Social Worker Current insurance: [facility]  PMHx includes but not limited to:  COPD, HTN, GERD.  Outreach:  Successful telephone call with Ms Koren.  HIPAA identifiers verified.  Patient aggreeable to review medications telephonically.  Medication list updated and evaluated in the EMR.  She states she is having difficulty affording her inhalers.  She states Incruse Ellipta controls her SOB/wheezing.  She reports 100% compliance and denies breathing difficulty as she takes Incruse daily as directed.  She is agreeable to participate in patient assistance program to obtain inhalers.   Lab Results  Component Value Date   CREATININE 0.91 10/19/2018   CREATININE 0.83 06/23/2017   CREATININE 0.92 04/15/2014    BP Readings from Last 3 Encounters:  10/19/18 (!) 161/90  01/16/18 131/77  09/17/17 124/64    Allergies  Allergen Reactions  . Lovastatin Swelling    Patient states that her tongue swells and she gets a tingling feeling all over.    Medications Reviewed Today    Reviewed by Lavera Guise, [facility] (Pharmacist) on 11/05/18 at 54  Med List Status: <None>  Medication Order Taking? Sig Documenting Provider Last Dose Status Informant  acetaminophen-codeine (TYLENOL #3) 300-30 MG tablet [id number] No TAKE ONE TABLET BY MOUTH EVERY 6 HOURS AS NEEDED FOR MODERATE PAIN.  Patient not taking: Reported on 11/05/2018   Carole Civil, MD Not Taking Active   amLODipine (NORVASC) 5 MG tablet [id number] Yes Take 1 tablet by mouth daily. [provider] Taking Active Self  aspirin EC 81 MG tablet [id number] Yes Take 81 mg by mouth every morning. [provider] Taking Active Self  atorvastatin (LIPITOR) 10 MG tablet [id number] Yes  [provider] Taking  Active         Discontinued 11/05/18 1106 (Completed Course)   Calcium-Magnesium-Zinc 333-133-5 MG TABS [id number] Yes Take 1 capsule by mouth daily. [provider] Taking Active Self  cetirizine (ZYRTEC) 10 MG tablet [id number] Yes Take 10 mg by mouth every morning. [provider] Taking Active Self  cholecalciferol (VITAMIN D) 1000 UNITS tablet [id number] Yes Take 1,000 Units by mouth at bedtime. [provider] Taking Active Self  citalopram (CELEXA) 20 MG tablet [id number] Yes Take 20 mg by mouth every morning. [provider] Taking Active Self  clotrimazole-betamethasone Donalynn Furlong) cream [id number] Yes  [provider] Taking Active   colchicine 0.6 MG tablet [id number] Yes Take 1 tablet (0.6 mg total) by mouth daily. Davonna Belling, MD Taking Active   furosemide (LASIX) 40 MG tablet [id number] Yes Take 20 mg by mouth daily.  [provider] Taking Active Self  gabapentin (NEURONTIN) 300 MG capsule [id number] Yes  [provider] Taking Active   INCRUSE ELLIPTA 62.5 MCG/INH AEPB [id number] Yes Inhale 1 puff into the lungs daily.  [provider] Taking Active Self  lisinopril (PRINIVIL,ZESTRIL) 20 MG tablet [id number] Yes Take 20 mg by mouth daily. [provider] Taking Active Self        Discontinued 11/05/18 1108 (Change in therapy)   metoprolol (LOPRESSOR) 100 MG tablet [id number] Yes Take 1 tablet by mouth daily. [provider] Taking Active Self           Med Note Ahmed Prima, AMBER C   Thu Mar 19, 2014 11:15 AM)  Discontinued 11/05/18 1109 (Discontinued by provider)   omeprazole (PRILOSEC) 20 MG capsule [id number] Yes Take 20 mg by mouth daily. [provider] Taking Active Self  potassium chloride (MICRO-K) 10 MEQ CR capsule [id number] Yes Take 1 capsule by mouth 2 (two) times daily. [provider] Taking Active Self           Med Note Ahmed Prima, AMBER C   Thu Mar 19, 2014  11:15 AM)           Discontinued 11/05/18 1109 (Completed Course)   PROAIR HFA 108 (90 BASE) MCG/ACT inhaler [id number] Yes Inhale 2 puffs into the lungs every 6 (six) hours as needed for wheezing or shortness of breath.  [provider] Taking Active Self           Med Note Ahmed Prima, AMBER C   Thu Mar 19, 2014 11:15 AM)     traMADol (ULTRAM) 50 MG tablet [id number] Yes  [provider] Taking Active   zolpidem (AMBIEN) 10 MG tablet [id number] Yes Take 10 mg by mouth at bedtime as needed for sleep. [provider] Taking Active Self           Medication Assistance Findings:   Patient Assistance Programs: Incruse Ellipta & Ventolin made by [place] requirement met: Yes o Out-of-pocket prescription expenditure met:   Yes - Patient has met application requirements to apply for this program.    Plan: . I will route patient assistance letter to [place] technician who will coordinate patient assistance program application process for medications listed above.  [facility] pharmacy technician will assist with obtaining all required documents from both patient and provider(s) and submit application(s) once completed.    Regina Eck, PharmD, [place]  [phone]

## 2018-11-15 ENCOUNTER — Other Ambulatory Visit: Payer: Self-pay | Admitting: Pharmacy Technician

## 2018-11-15 NOTE — Patient Outreach (Signed)
[facility]) Care Management  11/15/2018  Heather [name] 07/11/1940 [id number]                                       Medication Assistance Referral  Referral From: [facility] RPh [name].  Medication/Company: [name] and [name] / Jolly Patient application portion:  Mailed Provider application portion: Faxed  to Dr. Truett Mainland Provider address/fax verified via: Office website   Follow up:  Will follow up with patient in 7-10 business days to confirm application(s) have been received.  [name] [name] [name] Certified Pharmacy Technician [place] Management Direct [phone]

## 2018-11-27 DIAGNOSIS — L508 Other urticaria: Secondary | ICD-10-CM | POA: Diagnosis not present

## 2018-11-27 DIAGNOSIS — I872 Venous insufficiency (chronic) (peripheral): Secondary | ICD-10-CM | POA: Diagnosis not present

## 2018-12-05 ENCOUNTER — Other Ambulatory Visit: Payer: Self-pay | Admitting: Pharmacy Technician

## 2018-12-05 NOTE — Patient Outreach (Signed)
[facility]) Care Management  12/05/2018  Heather [name] [birth year] [id number]    Successful call placed to patient regarding patient assistance application(s) for Incruse Ellipta and [name] , HIPAA identifiers verified. Heather [name] confirms that she received [place] application. She states that she is waiting for [facility] to mail her her OOP spend report and then she will mail documents back into me.  Follow up:  Will follow up with patient in 14-21 business days if documents have not been received.  [name] Heather [name] [place] Certified Pharmacy Technician [place] Management Direct Dial:[phone]

## 2018-12-09 DIAGNOSIS — K219 Gastro-esophageal reflux disease without esophagitis: Secondary | ICD-10-CM | POA: Diagnosis not present

## 2018-12-09 DIAGNOSIS — J449 Chronic obstructive pulmonary disease, unspecified: Secondary | ICD-10-CM | POA: Diagnosis not present

## 2018-12-16 ENCOUNTER — Other Ambulatory Visit: Payer: Self-pay | Admitting: Pharmacist

## 2018-12-16 NOTE — Patient Outreach (Signed)
[place] [facility]) Care Management [place]  12/16/2018  Heather [name] 1940/09/28 [id number]  Reason for referral:   Successful outreach to [name] with HIPAA identifiers verified.  [name] states she is doing well, however still battling with gout flares.  Encouraged her to call her PCP if this continues to be an issue.  She doesn't appear to be on any medication to prevent this.  Patient states breathing is okay as long as she takes her inhalers as prescribed.  Encouraged patient to continue taking medications as prescribed.  Patient agreeable to contact [employer] for printout to assist with [place] application.  Case discussed with [facility] CPhT, Etter Sjogren, who is assisting with this process.   Regina Eck, PharmD, [place]  [phone]

## 2018-12-17 ENCOUNTER — Other Ambulatory Visit: Payer: Self-pay | Admitting: Pharmacy Technician

## 2018-12-17 NOTE — Patient Outreach (Addendum)
[place] [facility]) Care Management  12/17/2018  Heather [name] Oct 18, 1940 [id number]   Call placed to Dr. Josephine Cables office in reference to fax that was sent multiple times requesting scripts for Incruse Ellipta and [name]. Heather [name] states that she was unable to locate fax request and asked me to refax. Fax sent  Successful call to patient, HIPAA identifiers verified, informed Heather [name] that I have application and required documents except for scripts from provider.   Will submit completed application to [place] once all documents have been obtained.  ADDENDUM 4:50pm  Received provider portion(s) of patient assistance application(s) for Incruse Ellipta and [name]. Faxed completed application and required documents into [employer].  Will follow up with company(ies) in 5-7 business days to check status of application(s).  [name] [name] [place] Certified Pharmacy Technician [place] Management Direct Dial:[phone]

## 2018-12-23 ENCOUNTER — Other Ambulatory Visit: Payer: Self-pay | Admitting: Pharmacy Technician

## 2018-12-23 NOTE — Patient Outreach (Signed)
[place] [facility]) Care Management  12/23/2018  Heather [name] [birth year] [id number]    Follow up call placed to [place] regarding patient assistance application(s) for Incruse Ellipta and [name] , Michela confirms patient has been approved for both medications as of 10/15 until 03/06/19. 90 day supply of meds to be delivered to patients home in 7-10 business days.  Follow up:  Will follow up with patient in 10-14 business days to confirm medications have been received.  [name] [name] [place] Certified Pharmacy Technician [place] Management Direct Dial:[phone]

## 2018-12-31 ENCOUNTER — Other Ambulatory Visit: Payer: Self-pay | Admitting: Pharmacist

## 2018-12-31 ENCOUNTER — Other Ambulatory Visit: Payer: Self-pay | Admitting: Pharmacy Technician

## 2018-12-31 NOTE — Patient Outreach (Addendum)
[place] [facility]) Care Management  12/31/2018  Heather [name] [birth year] [id number]    Successful call placed to patient regarding patient assistance medication delivery of Incruse Ellipta and [name], HIPAA identifiers verified. [name] confirms that she received a 90 days supply of both medications from [facility]. She confirms that she has no additional questions at this time.  Reviewed with her on required Out of pocket spend amount needed to reapply for program in 2021.  Follow up:  Will route note to [place] for case closure  [name]. [name] [place] Certified Pharmacy Technician [place] Management Direct Dial:[phone]

## 2018-12-31 NOTE — Patient Outreach (Signed)
[place] [facility]) Care Management [place]  12/31/2018  CAROLANN BRAZELL 02/25/1941 [id number]  Reason for referral: medication assistance   [facility] pharmacy case is being closed due to the following reasons:  -Will close [facility] pharmacy case as goals of care have been met.     Regina Eck, PharmD, [place]  [phone]

## 2019-01-09 DIAGNOSIS — J449 Chronic obstructive pulmonary disease, unspecified: Secondary | ICD-10-CM | POA: Diagnosis not present

## 2019-01-09 DIAGNOSIS — M1990 Unspecified osteoarthritis, unspecified site: Secondary | ICD-10-CM | POA: Diagnosis not present

## 2019-01-20 DIAGNOSIS — M545 Low back pain: Secondary | ICD-10-CM | POA: Diagnosis not present

## 2019-01-20 DIAGNOSIS — M25519 Pain in unspecified shoulder: Secondary | ICD-10-CM | POA: Diagnosis not present

## 2019-01-20 DIAGNOSIS — M542 Cervicalgia: Secondary | ICD-10-CM | POA: Diagnosis not present

## 2019-01-20 DIAGNOSIS — G5603 Carpal tunnel syndrome, bilateral upper limbs: Secondary | ICD-10-CM | POA: Diagnosis not present

## 2019-02-10 DIAGNOSIS — E785 Hyperlipidemia, unspecified: Secondary | ICD-10-CM | POA: Diagnosis not present

## 2019-02-10 DIAGNOSIS — I10 Essential (primary) hypertension: Secondary | ICD-10-CM | POA: Diagnosis not present

## 2019-02-10 DIAGNOSIS — J449 Chronic obstructive pulmonary disease, unspecified: Secondary | ICD-10-CM | POA: Diagnosis not present

## 2019-03-13 DIAGNOSIS — E785 Hyperlipidemia, unspecified: Secondary | ICD-10-CM | POA: Diagnosis not present

## 2019-03-13 DIAGNOSIS — J449 Chronic obstructive pulmonary disease, unspecified: Secondary | ICD-10-CM | POA: Diagnosis not present

## 2019-04-13 DIAGNOSIS — K219 Gastro-esophageal reflux disease without esophagitis: Secondary | ICD-10-CM | POA: Diagnosis not present

## 2019-04-13 DIAGNOSIS — J449 Chronic obstructive pulmonary disease, unspecified: Secondary | ICD-10-CM | POA: Diagnosis not present

## 2019-04-19 ENCOUNTER — Ambulatory Visit: Payer: Medicare Other

## 2019-04-25 ENCOUNTER — Other Ambulatory Visit (HOSPITAL_COMMUNITY): Payer: Self-pay | Admitting: Internal Medicine

## 2019-04-25 DIAGNOSIS — Z1231 Encounter for screening mammogram for malignant neoplasm of breast: Secondary | ICD-10-CM

## 2019-05-10 DIAGNOSIS — G5603 Carpal tunnel syndrome, bilateral upper limbs: Secondary | ICD-10-CM | POA: Insufficient documentation

## 2019-05-10 DIAGNOSIS — M5417 Radiculopathy, lumbosacral region: Secondary | ICD-10-CM | POA: Insufficient documentation

## 2019-05-10 DIAGNOSIS — M545 Low back pain, unspecified: Secondary | ICD-10-CM | POA: Insufficient documentation

## 2019-05-10 DIAGNOSIS — M542 Cervicalgia: Secondary | ICD-10-CM | POA: Insufficient documentation

## 2019-05-10 DIAGNOSIS — M130 Polyarthritis, unspecified: Secondary | ICD-10-CM | POA: Insufficient documentation

## 2019-05-10 DIAGNOSIS — G8929 Other chronic pain: Secondary | ICD-10-CM | POA: Insufficient documentation

## 2019-05-12 DIAGNOSIS — G5603 Carpal tunnel syndrome, bilateral upper limbs: Secondary | ICD-10-CM | POA: Diagnosis not present

## 2019-05-12 DIAGNOSIS — M533 Sacrococcygeal disorders, not elsewhere classified: Secondary | ICD-10-CM | POA: Diagnosis not present

## 2019-05-12 DIAGNOSIS — M545 Low back pain: Secondary | ICD-10-CM | POA: Diagnosis not present

## 2019-05-12 DIAGNOSIS — M25519 Pain in unspecified shoulder: Secondary | ICD-10-CM | POA: Diagnosis not present

## 2019-05-12 DIAGNOSIS — M542 Cervicalgia: Secondary | ICD-10-CM | POA: Diagnosis not present

## 2019-05-13 DIAGNOSIS — I10 Essential (primary) hypertension: Secondary | ICD-10-CM | POA: Diagnosis not present

## 2019-05-13 DIAGNOSIS — J449 Chronic obstructive pulmonary disease, unspecified: Secondary | ICD-10-CM | POA: Diagnosis not present

## 2019-05-13 DIAGNOSIS — Z0001 Encounter for general adult medical examination with abnormal findings: Secondary | ICD-10-CM | POA: Diagnosis not present

## 2019-05-13 DIAGNOSIS — Z1389 Encounter for screening for other disorder: Secondary | ICD-10-CM | POA: Diagnosis not present

## 2019-05-19 DIAGNOSIS — I10 Essential (primary) hypertension: Secondary | ICD-10-CM | POA: Diagnosis not present

## 2019-05-19 DIAGNOSIS — E785 Hyperlipidemia, unspecified: Secondary | ICD-10-CM | POA: Diagnosis not present

## 2019-05-19 DIAGNOSIS — Z0001 Encounter for general adult medical examination with abnormal findings: Secondary | ICD-10-CM | POA: Diagnosis not present

## 2019-05-23 ENCOUNTER — Other Ambulatory Visit: Payer: Self-pay

## 2019-05-23 ENCOUNTER — Ambulatory Visit (HOSPITAL_COMMUNITY)
Admission: RE | Admit: 2019-05-23 | Discharge: 2019-05-23 | Disposition: A | Discharge status: Home / Self Care | Payer: Medicare Other | Source: Ambulatory Visit | Attending: Internal Medicine | Admitting: Internal Medicine

## 2019-05-23 DIAGNOSIS — Z1231 Encounter for screening mammogram for malignant neoplasm of breast: Secondary | ICD-10-CM

## 2019-06-13 DIAGNOSIS — I10 Essential (primary) hypertension: Secondary | ICD-10-CM | POA: Diagnosis not present

## 2019-06-13 DIAGNOSIS — K219 Gastro-esophageal reflux disease without esophagitis: Secondary | ICD-10-CM | POA: Diagnosis not present

## 2019-06-13 DIAGNOSIS — J309 Allergic rhinitis, unspecified: Secondary | ICD-10-CM | POA: Diagnosis not present

## 2019-07-07 DIAGNOSIS — M533 Sacrococcygeal disorders, not elsewhere classified: Secondary | ICD-10-CM | POA: Insufficient documentation

## 2019-07-07 DIAGNOSIS — Z79891 Long term (current) use of opiate analgesic: Secondary | ICD-10-CM | POA: Insufficient documentation

## 2019-09-22 ENCOUNTER — Ambulatory Visit: Payer: Medicare Other | Admitting: Podiatrist

## 2019-09-22 ENCOUNTER — Encounter: Payer: Self-pay | Admitting: Podiatrist

## 2019-09-22 ENCOUNTER — Other Ambulatory Visit: Payer: Self-pay

## 2019-09-22 ENCOUNTER — Ambulatory Visit (INDEPENDENT_AMBULATORY_CARE_PROVIDER_SITE_OTHER): Payer: Medicare Other

## 2019-09-22 DIAGNOSIS — M2041 Other hammer toe(s) (acquired), right foot: Secondary | ICD-10-CM

## 2019-09-22 DIAGNOSIS — M2031 Hallux varus (acquired), right foot: Secondary | ICD-10-CM | POA: Diagnosis not present

## 2019-09-22 DIAGNOSIS — M79672 Pain in left foot: Secondary | ICD-10-CM

## 2019-09-22 DIAGNOSIS — B353 Tinea pedis: Secondary | ICD-10-CM | POA: Diagnosis not present

## 2019-09-22 DIAGNOSIS — M109 Gout, unspecified: Secondary | ICD-10-CM

## 2019-09-22 DIAGNOSIS — M79671 Pain in right foot: Secondary | ICD-10-CM | POA: Diagnosis not present

## 2019-09-22 DIAGNOSIS — M216X1 Other acquired deformities of right foot: Secondary | ICD-10-CM

## 2019-09-22 DIAGNOSIS — M2042 Other hammer toe(s) (acquired), left foot: Secondary | ICD-10-CM | POA: Diagnosis not present

## 2019-09-22 NOTE — Progress Notes (Signed)
Chief Complaint  Patient presents with  . Foot Pain    Bilateral hallux and plantar forefoot submet 1; R plantar forefoot submet 5. Intermittent pain in the R foot for years. Pt stated, "I was diagnosed with gout in both [hallux] in May. I take allopurinol every day. Pain = 7/10 most of the time".     HPI: Patient is 79 y.o. female who presents today for the concerns as listed above. She relates pain in the right foot for several years and has pain plantarly at the forefoot bilaterally today.  She has been diagnosed with gout and is on allopurinol currently.  She also has some itching skin between the toes.     Review of Systems No fevers, chills, nausea, muscle aches, no difficulty breathing, no calf pain, no chest pain or shortness of breath.   Physical Exam  GENERAL APPEARANCE: Alert, conversant. Appropriately groomed. No acute distress.   VASCULAR: Pedal pulses palpable DP and PT 2/4 bilateral.  Capillary refill time is immediate to all digits,  Proximal to distal cooling it warm to warm.  Digital perfusion adequate.  Edema present bilateral.   NEUROLOGIC: sensation is intact epicritically and protectively to 5.07 monofilament at 5/5 sites bilateral.  Light touch is intact bilateral, vibratory sensation intact bilateral, achilles tendon reflex is intact bilateral.   MUSCULOSKELETAL: hallux varus noted on the right foot. Medial transverse position of digits 2-5 right.   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  Contracture of lesser digits 2-5 noted left foot.  Pain on palpation first metatarsal phalangeal joint and first interspace bilaterally is palpated.  Pain submet 4/5 is also palpated bilaterally.    DERMATOLOGIC: skin is warm, supple, and dry.  No open lesions noted. Movable soft tissue mass present on the distal aspect of the right distal hallux.  It appears unchanged from her last visit in 2018. . Digital nails are asymptomatic.  Interdigital tinea present between  the digits of the left foot.  Fat pad atrophy plantar forefoot noted bilateral.   Xray:  3 views bilateral feet obtained.  Left foot shows a hallux abductovalgus deformity with staples present at the hallux fusion site-  contracture of lesser digits.  Right foot shows hallux varus deformity-  Resection partial distal phalanx of the first is seen.  Medial translation of digits 2-5 noted as well.  Generalized diffuse osteopenia noted.    Assessment     ICD-10-CM   1. Bilateral foot pain  M79.671 CANCELED: DG Foot Complete Right   M79.672 CANCELED: DG Foot Complete Left  2. 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 Tinea pedis of left foot  B35.3   5. Prominent metatarsal head of right foot  M21.6X1   6. Gout involving toe of left foot, unspecified cause, unspecified chronicity  M10.9      Plan  Discussed treatment options and alternatives.  I recommended a steroid injection into the first interspace left foot as I suspect unresolved inflammation from her recent gout attack.  I also recommended diabetic type inserts for her shoes and we had some in her size which were dispensed for her to try- this should provide cushioning for her plantar forefoot pain.  Discussed her foot issues are also related to her boney structure and she may be interested in surgical correction in the future.  If she decides so, she will follow up with one of our  surgeons at her convenience.  Also rx for ketoconazole to be called in for her.    Injection as follows:  Discussed etiology, pathology, conservative vs. Surgical therapies and at this time an injection was recommended.  The patient agreed and a sterile skin prep was applied.  An injection consisting of dexamethasone and marcaine mixture was infiltrated into the bursae of the left.  The patient tolerated this well and was given instructions for aftercare.

## 2019-09-25 ENCOUNTER — Other Ambulatory Visit: Payer: Self-pay | Admitting: Podiatrist

## 2019-09-25 DIAGNOSIS — M2042 Other hammer toe(s) (acquired), left foot: Secondary | ICD-10-CM

## 2019-09-26 MED ORDER — KETOCONAZOLE 2 % EX CREA: TOPICAL_CREAM | CUTANEOUS | 2 refills | Status: DC

## 2019-10-27 ENCOUNTER — Ambulatory Visit: Payer: Medicare Other | Admitting: Podiatry

## 2019-10-27 ENCOUNTER — Other Ambulatory Visit (HOSPITAL_COMMUNITY): Payer: Self-pay | Admitting: Neurology

## 2019-10-27 DIAGNOSIS — M25552 Pain in left hip: Secondary | ICD-10-CM

## 2019-10-27 DIAGNOSIS — M545 Low back pain, unspecified: Secondary | ICD-10-CM

## 2019-10-28 ENCOUNTER — Ambulatory Visit (HOSPITAL_COMMUNITY): Payer: Medicare HMO | Attending: Neurology | Admitting: Physical Therapy

## 2019-10-28 ENCOUNTER — Other Ambulatory Visit: Payer: Self-pay

## 2019-10-28 ENCOUNTER — Ambulatory Visit (HOSPITAL_COMMUNITY)
Admission: RE | Admit: 2019-10-28 | Discharge: 2019-10-28 | Disposition: A | Discharge status: Home / Self Care | Payer: Medicare HMO | Source: Ambulatory Visit | Attending: Neurology | Admitting: Neurology

## 2019-10-28 ENCOUNTER — Encounter (HOSPITAL_COMMUNITY): Payer: Self-pay | Admitting: Physical Therapy

## 2019-10-28 ENCOUNTER — Other Ambulatory Visit (HOSPITAL_COMMUNITY): Payer: Self-pay | Admitting: Neurology

## 2019-10-28 DIAGNOSIS — R2689 Other abnormalities of gait and mobility: Secondary | ICD-10-CM | POA: Insufficient documentation

## 2019-10-28 DIAGNOSIS — M25552 Pain in left hip: Secondary | ICD-10-CM | POA: Insufficient documentation

## 2019-10-28 DIAGNOSIS — M545 Low back pain, unspecified: Secondary | ICD-10-CM

## 2019-10-28 DIAGNOSIS — M6281 Muscle weakness (generalized): Secondary | ICD-10-CM

## 2019-10-28 NOTE — Patient Instructions (Addendum)
Bridge    Lie back, legs bent. Inhale, pressing hips up. Keeping ribs in, lengthen lower back. Exhale, rolling down along spine from top. Repeat _10___ times. Do _1___ sessions per day.  http://pm.exer.us/55   Copyright  VHI. All rights reserved.   

## 2019-10-28 NOTE — Therapy (Signed)
[facility] [address] [place], [place], [address] Phone: [phone]   Fax:  [phone]  Physical Therapy Evaluation  Patient Details  Name: Heather [name] MRN: [id number] Date of Birth: 1940/06/17 Referring Provider (PT): Jorge Mandril, NP   Encounter Date: 10/28/2019   PT End of Session - 10/28/19 1155    Visit Number 1    Number of Visits 12    Date for PT Re-Evaluation 12/09/19    Authorization Type [employer]    Authorization Time Period Authorization requested check approval: 10/28/19 - 12/09/19    Authorization - Visit Number 1    Authorization - Number of Visits 12    Progress Note Due on Visit 12    PT Start Time 0950    PT Stop Time 1030    PT Time Calculation (min) 40 min    Equipment Utilized During Treatment Gait belt    Activity Tolerance Patient tolerated treatment well    Behavior During Therapy [facility] for tasks assessed/performed           Past Medical History:  Diagnosis Date  . Anemia   . Anxiety   . Arthritis   . COPD (chronic obstructive pulmonary disease) (HCC)   . Depression   . GERD (gastroesophageal reflux disease)   . Glaucoma   . Gout   . Gout 10/23/2018  . HTN (hypertension)     Past Surgical History:  Procedure Laterality Date  . ABDOMINAL HYSTERECTOMY    . BACK SURGERY     lumbar-disc  . BUNIONECTOMY Bilateral   . FOOT SURGERY Left    repair of "kissing Cousins"  . JOINT REPLACEMENT Bilateral    hip   . JOINT REPLACEMENT Right 2010 or 2012   knee  . KNEE SURGERY    . SHOULDER ARTHROSCOPY Right    shoulder surgery in [place] about 2000  . SHOULDER HEMI-ARTHROPLASTY Left 03/25/2014   Procedure: LEFT SHOULDER HEMI-ARTHROPLASTY;  Surgeon: Vickki Hearing, MD;  Location: [facility];  Service: Orthopedics;  Laterality: Left;    There were no vitals filed for this visit.    Subjective Assessment - 10/28/19 0957    Subjective Patient reported that her left hip has been hurting  for about 2 months. She stated that the pain stays up in her left hip and does not travel down her leg. She reported a history of a left hip replacement in 2002. She stated that she also a right hip replacement in 1992 and a revision in 2004. Patient reported a history of a right knee replacement 2012. She also reported a left shoulder hemi-arthroplasty in 2016. She reported a lumbar-disc back surgery in 1992. She stated that she has arthritis in all of her joints, and that rheumatoid arthritis runs in her family, but that she did not have rheumatoid arthritis when tested. She reported no pain currently, but that she took pain medicine before she left home. Patient reported a maximum of a 9/10 hip pain over the last week. She reported that bending down is difficult.    Pertinent History History of bilateral hip THA, RT knee TKA, and back surgery    Limitations Standing;Walking;House hold activities    How long can you sit comfortably? Not limited    How long can you stand comfortably? 10 minutes    How long can you walk comfortably? 10 minutes    Diagnostic tests Plan to get an x-ray of hip and back on 10/28/19  Patient Stated Goals To have less left hip pain    Currently in Pain? No/denies              [facility] PT Assessment - 10/28/19 0001      Assessment   Medical Diagnosis Left hip pain    Referring Provider (PT) Jorge Mandril, NP    Onset Date/Surgical Date 08/28/19    Next MD Visit 12/28/19    Prior Therapy Yes, for back and shoulder      Precautions   Precautions None      Restrictions   Weight Bearing Restrictions No      Balance Screen   Has the patient fallen in the past 6 months No      Home Environment   Living Environment Private residence    Living Arrangements Alone    Type of Home Apartment    Home Access Level entry    Home Layout One level    Home Equipment [place] - single point   She was using until she got medicine in her back     Prior Function   Level of  Independence Independent    Vocation Retired      IT consultant   Overall Cognitive Status Within Functional Limits for tasks assessed      Observation/Other Assessments   Focus on Therapeutic Outcomes (FOTO)  45.5%      ROM / Strength   AROM / PROM / Strength PROM;Strength      PROM   PROM Assessment Site Hip    Right/Left Hip Right;Left    Right Hip External Rotation  --   10% limited   Right Hip Internal Rotation  --   10% limited   Left Hip External Rotation  --   30% limited   Left Hip Internal Rotation  --   30% limited     Strength   Strength Assessment Site Hip;Knee;Ankle    Right/Left Hip Right;Left    Right Hip Flexion 4/5    Right Hip Extension 2+/5   Tested sidelying   Right Hip ABduction 4-/5    Left Hip Flexion 4-/5   Painful   Left Hip Extension 2+/5   Tested sidelying   Left Hip ABduction 4-/5    Right/Left Knee Right;Left    Right Knee Flexion 4+/5    Right Knee Extension 4+/5    Left Knee Flexion 4+/5    Left Knee Extension 4+/5    Right/Left Ankle Right;Left    Right Ankle Dorsiflexion 4+/5    Left Ankle Dorsiflexion 4+/5      Palpation   Palpation comment TTP at greater trochanter bilateral LEs      Transfers   Five time sit to stand comments  25.6 seconds; UEs braces on LEs and momentum strategy      Ambulation/Gait   Ambulation/Gait Yes    Ambulation/Gait Assistance 6: Modified independent (Device/Increase time)    Ambulation Distance (Feet) 251 Feet     Assistive device None    Gait Pattern Left circumduction;Trendelenburg;Antalgic    Gait Comments Increased lateral movement at trunk                      Objective measurements completed on examination: See above findings.               PT Education - 10/28/19 1155    Education Details Discussed examination findings, POC, and initial HEP.    Person(s) Educated Patient  Methods Explanation;Handout    Comprehension Verbalized understanding            PT  Short Term Goals - 10/28/19 1157      PT SHORT TERM GOAL #1   Title Patient will report understanding and regular compliance with HEP to improve strength, mobility and decrease pain.    Time 3    Period Weeks    Status New    Target Date 11/18/19      PT SHORT TERM GOAL #2   Title Patient will report improvement in overall subjective complaint of at least 25% for improved QOL.    Time 3    Period Weeks    Status New    Target Date 11/18/19             PT Long Term Goals - 10/28/19 1157      PT LONG TERM GOAL #1   Title Patient will report improvement in overall subjective complaint of at least 50% for improved QOL.    Time 6    Period Weeks    Status New    Target Date 12/09/19      PT LONG TERM GOAL #2   Title Patient will demonstrate improvement on FOTO of at least 10% indicating improvement in overall functional mobility.    Time 6    Period Weeks    Status New    Target Date 12/09/19      PT LONG TERM GOAL #3   Title Patient will demonstrate ability to perform 5xSTS test by an improvement of at least 10 seconds and without any upper extremity assistance.    Time 6    Period Weeks    Status New    Target Date 12/09/19                  Plan - 10/28/19 1200    Clinical Impression Statement Patient is a 79 year old female who presents to outpatient physical therapy with primary complaint of left hip pain which has been ongoing for several months. Patient has an extensive surgical history with bilateral THA, RT TKA, and back surgery. Patient presents with decreased strength by evidence of MMT as well as the 5xSTS test. Patient demonstrates significant deficits in gait in both speed and quality of movement. In addition, patient reports a high pain maximum in her left hip which is limiting her current functional mobility. Patient would benefit from continued skilled physical therapy in order to address the abovementioned deficits and help patient return to her  PLOF.    Personal Factors and Comorbidities Comorbidity 3+    Comorbidities HTN, THA LT & RT, TKA RT, COPD    Examination-Activity Limitations Stand;Locomotion Level;Toileting;Bend;Transfers;Squat;Stairs    Examination-Participation Restrictions Cleaning;Meal Prep;Yard Work;Community Activity;Shop    Stability/Clinical Decision Making Evolving/Moderate complexity    Clinical Decision Making Moderate    Rehab Potential Good    PT Frequency 2x / week    PT Duration 6 weeks    PT Treatment/Interventions ADLs/Self Care Home Management;Aquatic Therapy;Cryotherapy;Electrical Stimulation;Moist Heat;DME Instruction;Gait training;Stair training;Functional mobility training;Therapeutic activities;Therapeutic exercise;Balance training;Neuromuscular re-education;Patient/family education;Orthotic Fit/Training;Manual techniques;Passive range of motion;Dry needling;Energy conservation;Taping    PT Next Visit Plan Review eval/goals. LE functional strengthening. Gentle hip mobility exercises (history of THA bilaterally). Gait and balance training.    PT Home Exercise Plan 10/28/19: Henreitta Leber    Consulted and Agree with Plan of Care Patient           Patient will benefit from skilled therapeutic  intervention in order to improve the following deficits and impairments:  Abnormal gait, Improper body mechanics, Pain, Decreased mobility, Decreased activity tolerance, Decreased endurance, Decreased range of motion, Decreased strength, Hypomobility, Difficulty walking  Visit Diagnosis: Pain in left hip  Other abnormalities of gait and mobility  Muscle weakness (generalized)     Problem List Patient Active Problem List   Diagnosis Date Noted  . Gout 10/24/2018  . HTN (hypertension) 04/17/2014  . Dry eyes 04/14/2014  . Bleeding from the nose 04/01/2014  . Renal insufficiency 04/01/2014  . Rotator cuff arthropathy   . Muscle weakness (generalized) 05/26/2013  . Pain in joint, shoulder region 05/26/2013  .  Rotator cuff syndrome 05/22/2013    Verne Carrow 10/28/2019, 12:08 PM  [facility] [address] [place], [place], [address] Phone: [phone]   Fax:  [phone]  Name: Heather [name] MRN: [id number] Date of Birth: [birth year]

## 2019-10-29 ENCOUNTER — Ambulatory Visit (INDEPENDENT_AMBULATORY_CARE_PROVIDER_SITE_OTHER): Payer: Medicare Other | Admitting: Podiatry

## 2019-10-29 DIAGNOSIS — M2031 Hallux varus (acquired), right foot: Secondary | ICD-10-CM

## 2019-10-29 DIAGNOSIS — L84 Corns and callosities: Secondary | ICD-10-CM

## 2019-10-29 DIAGNOSIS — L02612 Cutaneous abscess of left foot: Secondary | ICD-10-CM | POA: Diagnosis not present

## 2019-10-29 MED ORDER — GENTAMICIN SULFATE 0.1 % EX CREA: 1.0000 "application " | TOPICAL_CREAM | Freq: Two times a day (BID) | CUTANEOUS | 1 refills | Status: DC

## 2019-10-29 MED ORDER — MINOCYCLINE HCL 100 MG PO CAPS: 100.0000 mg | ORAL_CAPSULE | Freq: Two times a day (BID) | ORAL | 0 refills | Status: DC

## 2019-10-29 NOTE — Progress Notes (Signed)
HPI: 79 y.o. female presenting today for evaluation of multiple complaints to the bilateral feet.  Patient does have a history of bunion and hammertoe surgery to the right foot several years ago.  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  She states that there is a prominent bone sticking out of her right great toe that is very painful and symptomatic. Patient also states that she has a lot of pain to a callus lesion to the lateral aspect of the right forefoot.  It has been present for several months.  She has not done anything for treatment. Finally the patient states that on the last visit with Dr. Donzetta Kohut on 09/22/2019 she received a steroid injection to the left hallux and she developed some pus and drainage to the area ever since.  It continues to be very painful.  She presents for further treatment evaluation  Past Medical History:  Diagnosis Date  . Anemia   . Anxiety   . Arthritis   . COPD (chronic obstructive pulmonary disease) (HCC)   . Depression   . GERD (gastroesophageal reflux disease)   . Glaucoma   . Gout   . Gout 10/23/2018  . HTN (hypertension)      Physical Exam: General: The patient is alert and oriented x3 in no acute distress.  Dermatology: Skin is warm, dry and supple bilateral lower extremities. Negative for open lesions or macerations.  Hyperkeratotic preulcerative callus lesion noted to the lateral aspect of the fifth MTPJ right foot. There is some fluctuance noted to the dorsal aspect of the left hallux with a draining sinus consistent with possible abscess/cellulitis.  It is very localized around the dorsal aspect of the toe.  With pressure applied there is only some serous drainage noted from a small lesion/superficial ulcer on the dorsal aspect of the left hallux.  No malodor noted.  Vascular: Palpable pedal pulses bilaterally. No edema or erythema noted. Capillary refill within normal limits.  Neurological: Epicritic and  protective threshold grossly intact bilaterally.   Musculoskeletal Exam: History of bunion and hammertoe correction right foot with iatrogenic development of hallux varus right.  Contracture at the level of the IPJ noted to the right hallux with a prominent metatarsal head that appears to be irritated and rubbing with shoe gear   Assessment: 1.  Hallux varus right 2.  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  Preulcerative callus lesion right fifth MTPJ 4.  Cellulitis/abscess localized to the dorsum of the left hallux  Plan of Care:  1. Patient evaluated. X-Rays reviewed that were taken last visit.  2.  Today I would like to pursue all conservative treatment modalities before proceeding with surgery.  Patient's hallux varus does not actually cause any symptoms other than the contracture at the level of the IPJ.  Silicone toe cap was provided today.  Wear daily with close toed shoes 3.  Excisional debridement of the preulcerative hyperkeratotic callus lesion was performed using a chisel blade to the right fifth MTPJ without incident or bleeding.  Patient noticed immediate relief 4.  In regards to the abscess of the left hallux, prescription for gentamicin cream and doxycycline 100 mg 2 times daily x10 days sent to the pharmacy 5.  Return to clinic in 4 weeks      Heather [name], DPM [facility]  Dr. Felecia [name], DPM    [address]  [place], [place] [address]                Office [phone]  Fax [phone]

## 2019-10-31 DIAGNOSIS — M79605 Pain in left leg: Secondary | ICD-10-CM | POA: Insufficient documentation

## 2019-11-03 ENCOUNTER — Telehealth (HOSPITAL_COMMUNITY): Payer: Self-pay

## 2019-11-03 NOTE — Telephone Encounter (Signed)
pt cancelled appt for 8/31 because she hurt her foot

## 2019-11-04 ENCOUNTER — Encounter (HOSPITAL_COMMUNITY): Payer: Medicare HMO

## 2019-11-06 ENCOUNTER — Other Ambulatory Visit: Payer: Self-pay

## 2019-11-06 ENCOUNTER — Encounter (HOSPITAL_COMMUNITY): Payer: Self-pay

## 2019-11-06 ENCOUNTER — Ambulatory Visit (HOSPITAL_COMMUNITY): Payer: Medicare HMO | Attending: Neurology

## 2019-11-06 DIAGNOSIS — M25552 Pain in left hip: Secondary | ICD-10-CM | POA: Insufficient documentation

## 2019-11-06 DIAGNOSIS — M6281 Muscle weakness (generalized): Secondary | ICD-10-CM | POA: Insufficient documentation

## 2019-11-06 DIAGNOSIS — R2689 Other abnormalities of gait and mobility: Secondary | ICD-10-CM | POA: Insufficient documentation

## 2019-11-06 NOTE — Patient Instructions (Signed)
External Rotation: Hip - Knees Apart (Hook-Lying)    Lie with hips and knees bent, band tied just above knees. Pull knees apart. Hold for 3-5___ seconds.  Repeat 10-15___ times. Do _1__ times a day.  Copyright  VHI. All rights reserved.   Flexors, Supine Bridge    Lie supine, feet shoulder-width apart. Red Theraband around lower thighs. Lift hips toward ceiling. Hold 1-3_ seconds. Repeat _10-15__ times per session. Do _1__ sessions per day.  Copyright  VHI. All rights reserved.    Sit-to-Stand Exercise  The sit-to-stand exercise (also known as the chair stand or chair rise exercise) strengthens your lower body and helps you maintain or improve your mobility and independence. The goal is to do the sit-to-stand exercise without using your hands. This will be easier as you become stronger. You should always talk with your health care provider before starting any exercise program, especially if you have had recent surgery. Do the exercise exactly as told by your health care provider and adjust it as directed. It is normal to feel mild stretching, pulling, tightness, or discomfort as you do this exercise, but you should stop right away if you feel sudden pain or your pain gets worse. Do not begin doing this exercise until told by your health care provider. What the sit-to-stand exercise does The sit-to-stand exercise helps to strengthen the muscles in your thighs and the muscles in the center of your body that give you stability (core muscles). This exercise is especially helpful if:  You have had knee or hip surgery.  You have trouble getting up from a chair, out of a car, or off the toilet. How to do the sit-to-stand exercise 1. Sit toward the front edge of a sturdy chair without armrests. Your knees should be bent and your feet should be flat on the floor and shoulder-width apart. 2. Place your hands lightly on each side of the seat. Keep your back and neck as straight as possible,  with your chest slightly forward. 3. Breathe in slowly. Lean forward and slightly shift your weight to the front of your feet. 4. Breathe out as you slowly stand up. Use your hands as little as possible. 5. Stand and pause for a full breath in and out. 6. Breathe in as you sit down slowly. Tighten your core and abdominal muscles to control your lowering as much as possible. 7. Breathe out slowly. 8. Do this exercise 10-15 times. If needed, do it fewer times until you build up strength. 9. Rest for 1 minute, then do another set of 10-15 repetitions. To change the difficulty of the sit-to-stand exercise  If the exercise is too difficult, use a chair with sturdy armrests, and push off the armrests to help you come to the standing position. You can also use the armrests to help slowly lower yourself back to sitting. As this gets easier, try to use your arms less. You can also place a firm cushion or pillow on the chair to make the surface higher.  If this exercise is too easy, do not use your arms to help raise or lower yourself. You can also wear a weighted vest, use hand weights, increase your repetitions, or try a lower chair. General tips  You may feel tired when starting an exercise routine. This is normal.  You may have muscle soreness that lasts a few days. This is normal. As you get stronger, you may not feel muscle soreness.  Use smooth, steady movements.  Do not  hold your breath during strength exercises. This can cause unsafe changes in your blood pressure.  Breathe in slowly through your nose, and breathe out slowly through your mouth. Summary  Strengthening your lower body is an important step to help you move safely and independently.  The sit-to-stand exercise helps strengthen the muscles in your thighs and core.  You should always talk with your health care provider before starting any exercise program, especially if you have had recent surgery. This information is not  intended to replace advice given to you by your health care provider. Make sure you discuss any questions you have with your health care provider. Document Revised: 12/19/2017 Document Reviewed: 04/13/2016 Elsevier Patient Education  2020 [employer].

## 2019-11-06 NOTE — Therapy (Signed)
[facility] [address] [place], [place], [address] Phone: [phone]   Fax:  [phone]  Physical Therapy Treatment  Patient Details  Name: Heather [name] MRN: [id number] Date of Birth: [birth year] Referring Provider (PT): Jorge Mandril, NP   Encounter Date: 11/06/2019   PT End of Session - 11/06/19 1207    Visit Number 2    Number of Visits 12    Date for PT Re-Evaluation 12/09/19    Authorization Type [employer]    Authorization Time Period Authorization requested check approval: 10/28/19 - 12/09/19    Authorization - Visit Number 2    Authorization - Number of Visits 12    Progress Note Due on Visit 12    PT Start Time 0957   patient arrived at 9:55 for 9:45 appointment   PT Stop Time 1032    PT Time Calculation (min) 35 min    Equipment Utilized During Treatment Gait belt    Activity Tolerance Patient tolerated treatment well    Behavior During Therapy WFL for tasks assessed/performed           Past Medical History:  Diagnosis Date   Anemia    Anxiety    Arthritis    COPD (chronic obstructive pulmonary disease) (HCC)    Depression    GERD (gastroesophageal reflux disease)    Glaucoma    Gout    Gout 10/23/2018   HTN (hypertension)     Past Surgical History:  Procedure Laterality Date   ABDOMINAL HYSTERECTOMY     BACK SURGERY     lumbar-disc   BUNIONECTOMY Bilateral    FOOT SURGERY Left    repair of "kissing Cousins"   JOINT REPLACEMENT Bilateral    hip    JOINT REPLACEMENT Right 2010 or 2012   knee   KNEE SURGERY     SHOULDER ARTHROSCOPY Right    shoulder surgery in [place] about 2000   SHOULDER HEMI-ARTHROPLASTY Left 03/25/2014   Procedure: LEFT SHOULDER HEMI-ARTHROPLASTY;  Surgeon: Vickki Hearing, MD;  Location: [facility];  Service: Orthopedics;  Laterality: Left;    There were no vitals filed for this visit.   Subjective Assessment - 11/06/19 0958    Subjective Attempted  HEP of bridges but immediately had more left front and back hip pain so she hasn't done the exercise since. Left great toe infection, was painful; taking medication and it is feeling better. Took pain medicine before coming to today's appointment.    Pertinent History History of bilateral hip THA, RT knee TKA, and back surgery    Limitations Standing;Walking;House hold activities    How long can you sit comfortably? Not limited    How long can you stand comfortably? 10 minutes    How long can you walk comfortably? 10 minutes    Diagnostic tests Plan to get an x-ray of hip and back on 10/28/19    Patient Stated Goals To have less left hip pain    Currently in Pain? No/denies              [facility] Adult PT Treatment/Exercise - 11/06/19 0001      Bed Mobility   Bed Mobility Rolling Left;Left Sidelying to Sit;Sit to Sidelying Left    Rolling Left Supervision/Verbal cueing    Left Sidelying to Sit Contact Guard/Touching assist    Sit to Sidelying Left Minimal Assistance - Patient > 75%      Exercises   Exercises Knee/Hip  Knee/Hip Exercises: Seated   Sit to Sand 10 reps      Knee/Hip Exercises: Supine   Bridges Strengthening;Both;1 set;10 reps    Bridges with Clamshell Strengthening;Both;1 set;10 reps   RTB aroundknees   Other Supine Knee/Hip Exercises clamshell w/ RTB 1x10             PT Education - 11/06/19 1206    Education Details Reviewed eval and goals. [employer] HEP. Discussed purpose and technique of interventions throughout session.    Person(s) Educated Patient    Methods Explanation;Demonstration;Handout    Comprehension Verbalized understanding;Need further instruction            PT Short Term Goals - 10/28/19 1157      PT SHORT TERM GOAL #1   Title Patient will report understanding and regular compliance with HEP to improve strength, mobility and decrease pain.    Time 3    Period Weeks    Status New    Target Date 11/18/19      PT SHORT TERM GOAL #2     Title Patient will report improvement in overall subjective complaint of at least 25% for improved QOL.    Time 3    Period Weeks    Status New    Target Date 11/18/19             PT Long Term Goals - 10/28/19 1157      PT LONG TERM GOAL #1   Title Patient will report improvement in overall subjective complaint of at least 50% for improved QOL.    Time 6    Period Weeks    Status New    Target Date 12/09/19      PT LONG TERM GOAL #2   Title Patient will demonstrate improvement on FOTO of at least 10% indicating improvement in overall functional mobility.    Time 6    Period Weeks    Status New    Target Date 12/09/19      PT LONG TERM GOAL #3   Title Patient will demonstrate ability to perform 5xSTS test by an improvement of at least 10 seconds and without any upper extremity assistance.    Time 6    Period Weeks    Status New    Target Date 12/09/19                 Plan - 11/06/19 1208    Clinical Impression Statement Patient arrived 10 minutes late to appointment. Review evaluation and goals with patient. Session thereafter focused on bed level and functional strengthening by reviewing supine bridges with ab set first with no pain complaints; adding ab set first for supine clams w/ RTB and supine bridges w/ RTB, and added STS. Added these same exercises to HEP and gave patient RTB for home use. Continue with current plan, progress to standing level functional strengthening and balance as able. Patient exhibits antalgic gait with compensated Trendelenburg on the right. Patient unable to tolerate left or right side-lying due to pain complaints.    Personal Factors and Comorbidities Comorbidity 3+    Comorbidities HTN, THA LT & RT, TKA RT, COPD    Examination-Activity Limitations Stand;Locomotion Level;Toileting;Bend;Transfers;Squat;Stairs    Examination-Participation Restrictions Cleaning;Meal Prep;Yard Work;Community Activity;Shop    Stability/Clinical Decision  Making Evolving/Moderate complexity    Rehab Potential Good    PT Frequency 2x / week    PT Duration 6 weeks    PT Treatment/Interventions ADLs/Self Care Home Management;Aquatic Therapy;Cryotherapy;Electrical Stimulation;Moist Heat;DME  Instruction;Gait training;Stair training;Functional mobility training;Therapeutic activities;Therapeutic exercise;Balance training;Neuromuscular re-education;Patient/family education;Orthotic Fit/Training;Manual techniques;Passive range of motion;Dry needling;Energy conservation;Taping    PT Next Visit Plan LE fxnl strengthenging, gentle hip mobility exs, gait/balance training.    PT Home Exercise Plan 10/28/19: Henreitta Leber; 11/06/19 - supine clams and bridge both w/ RTB; STS    Consulted and Agree with Plan of Care Patient           Patient will benefit from skilled therapeutic intervention in order to improve the following deficits and impairments:  Abnormal gait, Improper body mechanics, Pain, Decreased mobility, Decreased activity tolerance, Decreased endurance, Decreased range of motion, Decreased strength, Hypomobility, Difficulty walking  Visit Diagnosis: Pain in left hip  Other abnormalities of gait and mobility  Muscle weakness (generalized)     Problem List Patient Active Problem List   Diagnosis Date Noted   Gout 10/24/2018   HTN (hypertension) 04/17/2014   Dry eyes 04/14/2014   Bleeding from the nose 04/01/2014   Renal insufficiency 04/01/2014   Rotator cuff arthropathy    Muscle weakness (generalized) 05/26/2013   Pain in joint, shoulder region 05/26/2013   Rotator cuff syndrome 05/22/2013    Katina Dung. Hartnett-Rands, MS, PT Per Ladoris Gene [facility] #[id number] 11/06/2019, 12:16 PM  [facility] [address] [place], [place], [address] Phone: [phone]   Fax:  [phone]  Name: Heather [name] MRN: [id number] Date of Birth: 16-Dec-1940

## 2019-11-11 ENCOUNTER — Other Ambulatory Visit: Payer: Self-pay

## 2019-11-11 ENCOUNTER — Ambulatory Visit (HOSPITAL_COMMUNITY): Payer: Medicare HMO | Admitting: Physical Therapy

## 2019-11-11 DIAGNOSIS — M6281 Muscle weakness (generalized): Secondary | ICD-10-CM

## 2019-11-11 DIAGNOSIS — R2689 Other abnormalities of gait and mobility: Secondary | ICD-10-CM

## 2019-11-11 DIAGNOSIS — M25552 Pain in left hip: Secondary | ICD-10-CM

## 2019-11-11 NOTE — Therapy (Signed)
[facility] [address] [place], [place], [address] Phone: [phone]   Fax:  [phone]  Physical Therapy Treatment  Patient Details  Name: Heather [name] MRN: [id number] Date of Birth: 17-May-1940 Referring Provider (PT): Jorge Mandril, NP   Encounter Date: 11/11/2019   PT End of Session - 11/11/19 1015    Visit Number 3    Number of Visits 12    Date for PT Re-Evaluation 12/09/19    Authorization Type [employer]    Authorization Time Period Authorization requested check approval: 10/28/19 - 12/09/19    Authorization - Visit Number 3    Authorization - Number of Visits 12    Progress Note Due on Visit 12    PT Start Time 0920    PT Stop Time 1000    PT Time Calculation (min) 40 min    Equipment Utilized During Treatment Gait belt    Activity Tolerance Patient tolerated treatment well    Behavior During Therapy [facility] for tasks assessed/performed           Past Medical History:  Diagnosis Date  . Anemia   . Anxiety   . Arthritis   . COPD (chronic obstructive pulmonary disease) (HCC)   . Depression   . GERD (gastroesophageal reflux disease)   . Glaucoma   . Gout   . Gout 10/23/2018  . HTN (hypertension)     Past Surgical History:  Procedure Laterality Date  . ABDOMINAL HYSTERECTOMY    . BACK SURGERY     lumbar-disc  . BUNIONECTOMY Bilateral   . FOOT SURGERY Left    repair of "kissing Cousins"  . JOINT REPLACEMENT Bilateral    hip   . JOINT REPLACEMENT Right 2010 or 2012   knee  . KNEE SURGERY    . SHOULDER ARTHROSCOPY Right    shoulder surgery in [place] about 2000  . SHOULDER HEMI-ARTHROPLASTY Left 03/25/2014   Procedure: LEFT SHOULDER HEMI-ARTHROPLASTY;  Surgeon: Vickki Hearing, MD;  Location: [facility];  Service: Orthopedics;  Laterality: Left;    There were no vitals filed for this visit.   Subjective Assessment - 11/11/19 0932    Subjective pt states she had family over yesterday and cooked for  them.  STates her pain is 7/10 today in her hip and lower back.    Currently in Pain? Yes    Pain Score 7     Pain Location Back    Pain Orientation Left                             [facility] Adult PT Treatment/Exercise - 11/11/19 0001      Knee/Hip Exercises: Seated   Sit to Sand 10 reps;without UE support      Knee/Hip Exercises: Supine   Bridges Strengthening;Both;1 set;10 reps    Bridges with Clamshell Strengthening;Both;10 reps;1 set    Other Supine Knee/Hip Exercises clamshell w/ RTB 1x10      Knee/Hip Exercises: Sidelying   Hip ABduction Both;10 reps      Knee/Hip Exercises: Prone   Hip Extension Both;10 reps;1 set    Other Prone Exercises heelsqueeze 10 X5" holds                    PT Short Term Goals - 10/28/19 1157      PT SHORT TERM GOAL #1   Title Patient will report understanding and regular compliance with HEP to  improve strength, mobility and decrease pain.    Time 3    Period Weeks    Status New    Target Date 11/18/19      PT SHORT TERM GOAL #2   Title Patient will report improvement in overall subjective complaint of at least 25% for improved QOL.    Time 3    Period Weeks    Status New    Target Date 11/18/19             PT Long Term Goals - 10/28/19 1157      PT LONG TERM GOAL #1   Title Patient will report improvement in overall subjective complaint of at least 50% for improved QOL.    Time 6    Period Weeks    Status New    Target Date 12/09/19      PT LONG TERM GOAL #2   Title Patient will demonstrate improvement on FOTO of at least 10% indicating improvement in overall functional mobility.    Time 6    Period Weeks    Status New    Target Date 12/09/19      PT LONG TERM GOAL #3   Title Patient will demonstrate ability to perform 5xSTS test by an improvement of at least 10 seconds and without any upper extremity assistance.    Time 6    Period Weeks    Status New    Target Date 12/09/19                  Plan - 11/11/19 1012    Clinical Impression Statement Focused this session on LE strengthening.  Pt unable to complete SLR due to weakness with Lt more weak than Rt.  Pt with difficulty keeping in correct form and isolating appropritate mm.  Pt required both verbal and tactile cues to complete correctly.  Pt with noted fatigues at end of session.    Personal Factors and Comorbidities Comorbidity 3+    Comorbidities HTN, THA LT & RT, TKA RT, COPD    Examination-Activity Limitations Stand;Locomotion Level;Toileting;Bend;Transfers;Squat;Stairs    Examination-Participation Restrictions Cleaning;Meal Prep;Yard Work;Community Activity;Shop    Stability/Clinical Decision Making Evolving/Moderate complexity    Rehab Potential Good    PT Frequency 2x / week    PT Duration 6 weeks    PT Treatment/Interventions ADLs/Self Care Home Management;Aquatic Therapy;Cryotherapy;Electrical Stimulation;Moist Heat;DME Instruction;Gait training;Stair training;Functional mobility training;Therapeutic activities;Therapeutic exercise;Balance training;Neuromuscular re-education;Patient/family education;Orthotic Fit/Training;Manual techniques;Passive range of motion;Dry needling;Energy conservation;Taping    PT Next Visit Plan Continue to focus on LE strengthenging, gentle hip mobility exs, gait/balance training.    PT Home Exercise Plan 10/28/19: Henreitta Leber; 11/06/19 - supine clams and bridge both w/ RTB; STS    Consulted and Agree with Plan of Care Patient           Patient will benefit from skilled therapeutic intervention in order to improve the following deficits and impairments:  Abnormal gait, Improper body mechanics, Pain, Decreased mobility, Decreased activity tolerance, Decreased endurance, Decreased range of motion, Decreased strength, Hypomobility, Difficulty walking  Visit Diagnosis: Pain in left hip  Other abnormalities of gait and mobility  Muscle weakness (generalized)     Problem  List Patient Active Problem List   Diagnosis Date Noted  . Gout 10/24/2018  . HTN (hypertension) 04/17/2014  . Dry eyes 04/14/2014  . Bleeding from the nose 04/01/2014  . Renal insufficiency 04/01/2014  . Rotator cuff arthropathy   . Muscle weakness (generalized) 05/26/2013  . Pain in joint,  shoulder region 05/26/2013  . Rotator cuff syndrome 05/22/2013   Lurena Nida, PTA/CLT [phone]  Lurena Nida 11/11/2019, 10:17 AM  [facility] [address] [place], [place], [address] Phone: [phone]   Fax:  [phone]  Name: JAZMYNN PHO MRN: [id number] Date of Birth: 08/28/1940

## 2019-11-12 ENCOUNTER — Ambulatory Visit (HOSPITAL_COMMUNITY): Payer: Medicare HMO

## 2019-11-12 ENCOUNTER — Encounter (HOSPITAL_COMMUNITY): Payer: Self-pay

## 2019-11-12 DIAGNOSIS — M25552 Pain in left hip: Secondary | ICD-10-CM | POA: Diagnosis not present

## 2019-11-12 DIAGNOSIS — R2689 Other abnormalities of gait and mobility: Secondary | ICD-10-CM

## 2019-11-12 DIAGNOSIS — M6281 Muscle weakness (generalized): Secondary | ICD-10-CM

## 2019-11-12 NOTE — Therapy (Signed)
[facility] [address] [place], [place], [address] Phone: [phone]   Fax:  [phone]  Physical Therapy Treatment  Patient Details  Name: Heather [name] MRN: [id number] Date of Birth: 1940/12/11 Referring Provider (PT): Jorge Mandril, NP   Encounter Date: 11/12/2019   PT End of Session - 11/12/19 1009    Visit Number 4    Number of Visits 12    Date for PT Re-Evaluation 12/09/19    Authorization Type [employer]    Authorization Time Period Authorization requested check approval: 10/28/19 - 12/09/19    Authorization - Visit Number 4    Authorization - Number of Visits 12    Progress Note Due on Visit 12    PT Start Time 1003    PT Stop Time 1045    PT Time Calculation (min) 42 min    Activity Tolerance Patient tolerated treatment well    Behavior During Therapy [facility] for tasks assessed/performed           Past Medical History:  Diagnosis Date  . Anemia   . Anxiety   . Arthritis   . COPD (chronic obstructive pulmonary disease) (HCC)   . Depression   . GERD (gastroesophageal reflux disease)   . Glaucoma   . Gout   . Gout 10/23/2018  . HTN (hypertension)     Past Surgical History:  Procedure Laterality Date  . ABDOMINAL HYSTERECTOMY    . BACK SURGERY     lumbar-disc  . BUNIONECTOMY Bilateral   . FOOT SURGERY Left    repair of "kissing Cousins"  . JOINT REPLACEMENT Bilateral    hip   . JOINT REPLACEMENT Right 2010 or 2012   knee  . KNEE SURGERY    . SHOULDER ARTHROSCOPY Right    shoulder surgery in [place] about 2000  . SHOULDER HEMI-ARTHROPLASTY Left 03/25/2014   Procedure: LEFT SHOULDER HEMI-ARTHROPLASTY;  Surgeon: Vickki Hearing, MD;  Location: [facility];  Service: Orthopedics;  Laterality: Left;    There were no vitals filed for this visit.   Subjective Assessment - 11/12/19 1006    Subjective Pt reports she was watching a show on TV that prayed for back and hip pain relief and reports no pain today.   Has been doing her exercises daily.    Pertinent History History of bilateral hip THA, RT knee TKA, and back surgery    Diagnostic tests Plan to get an x-ray of hip and back on 10/28/19    Patient Stated Goals To have less left hip pain    Currently in Pain? No/denies              [facility] PT Assessment - 11/12/19 0001      Assessment   Medical Diagnosis Left hip pain    Referring Provider (PT) Jorge Mandril, NP    Onset Date/Surgical Date 08/28/19    Next MD Visit 12/28/19    Prior Therapy Yes, for back and shoulder      Precautions   Precautions None    Precaution Comments follows precautions for posterior hip replacement                         [facility] Adult PT Treatment/Exercise - 11/12/19 0001      Exercises   Exercises Knee/Hip      Knee/Hip Exercises: Supine   Quad Sets Right;10 reps    Quad Sets Limitations quad set prior SLR  Short Arc Presenter, broadcasting then able to complete SLR from bolster    Bridges Strengthening;Both;10 reps;2 [employer] Limitations 10" holds    Straight Leg Raises Right;2 sets;5 reps    Straight Leg Raises Limitations unable to complete Lt LE    Other Supine Knee/Hip Exercises marching with ab set; 2 setsx 5 reps      Knee/Hip Exercises: Sidelying   Other Sidelying Knee/Hip Exercises reports discomfort in hip laying on side      Knee/Hip Exercises: Prone   Hip Extension Both;10 reps;1 set    Hip Extension Limitations cueing for form    Other Prone Exercises heelsqueeze 10 X5" holds                    PT Short Term Goals - 10/28/19 1157      PT SHORT TERM GOAL #1   Title Patient will report understanding and regular compliance with HEP to improve strength, mobility and decrease pain.    Time 3    Period Weeks    Status New    Target Date 11/18/19      PT SHORT TERM GOAL #2   Title Patient will report improvement in overall subjective complaint of at least  25% for improved QOL.    Time 3    Period Weeks    Status New    Target Date 11/18/19             PT Long Term Goals - 10/28/19 1157      PT LONG TERM GOAL #1   Title Patient will report improvement in overall subjective complaint of at least 50% for improved QOL.    Time 6    Period Weeks    Status New    Target Date 12/09/19      PT LONG TERM GOAL #2   Title Patient will demonstrate improvement on FOTO of at least 10% indicating improvement in overall functional mobility.    Time 6    Period Weeks    Status New    Target Date 12/09/19      PT LONG TERM GOAL #3   Title Patient will demonstrate ability to perform 5xSTS test by an improvement of at least 10 seconds and without any upper extremity assistance.    Time 6    Period Weeks    Status New    Target Date 12/09/19                 Plan - 11/12/19 1016    Clinical Impression Statement Session focus with BLE strengthening.  Pt presents with weakness with inability to complete full range with bridges and unable to complete SLR Lt LE.  Added SAQ and marching for quad strengthening.  Some difficulty keeping in correct form with and isolationg appropraite muscualture, verbal and tactile cueing complete to assist with proper mm activaiton.    Comorbidities HTN, THA LT & RT, TKA RT, COPD    Examination-Activity Limitations Stand;Locomotion Level;Toileting;Bend;Transfers;Squat;Stairs    Examination-Participation Restrictions Cleaning;Meal Prep;Yard Work;Community Activity;Shop    Stability/Clinical Decision Making Evolving/Moderate complexity    Clinical Decision Making Moderate    Rehab Potential Good    PT Frequency 2x / week    PT Duration 6 weeks    PT Treatment/Interventions ADLs/Self Care Home Management;Aquatic Therapy;Cryotherapy;Electrical Stimulation;Moist Heat;DME Instruction;Gait training;Stair training;Functional mobility training;Therapeutic activities;Therapeutic exercise;Balance training;Neuromuscular  re-education;Patient/family education;Orthotic Fit/Training;Manual techniques;Passive range of  motion;Dry needling;Energy conservation;Taping    PT Next Visit Plan Continue to focus on LE strengthenging, gentle hip mobility exs, gait/balance training.    PT Home Exercise Plan 10/28/19: Henreitta Leber; 11/06/19 - supine clams and bridge both w/ RTB; STS           Patient will benefit from skilled therapeutic intervention in order to improve the following deficits and impairments:  Abnormal gait, Improper body mechanics, Pain, Decreased mobility, Decreased activity tolerance, Decreased endurance, Decreased range of motion, Decreased strength, Hypomobility, Difficulty walking  Visit Diagnosis: Pain in left hip  Other abnormalities of gait and mobility  Muscle weakness (generalized)     Problem List Patient Active Problem List   Diagnosis Date Noted  . Gout 10/24/2018  . HTN (hypertension) 04/17/2014  . Dry eyes 04/14/2014  . Bleeding from the nose 04/01/2014  . Renal insufficiency 04/01/2014  . Rotator cuff arthropathy   . Muscle weakness (generalized) 05/26/2013  . Pain in joint, shoulder region 05/26/2013  . Rotator cuff syndrome 05/22/2013   Becky Sax, LPTA/CLT; CBIS [phone]  Juel Burrow 11/12/2019, 5:02 PM  [facility] [address] [place], [place], [address] Phone: [phone]   Fax:  [phone]  Name: CHARRIE MCCONNON MRN: [id number] Date of Birth: [birth year]

## 2019-11-12 NOTE — Patient Instructions (Signed)
Bracing With Leg March (Hook-Lying)    With neutral spine, tighten pelvic floor and abdominals and hold.  Alternating legs, lift foot 12 inches and return to floor. Repeat 10 times. Do 2 times a day.   Copyright  VHI. All rights reserved.   Heel Squeeze (Prone)    Abdomen supported, bend knees and gently squeeze heels together. Hold 10 seconds. Repeat 10 times per set. Do 1 sets per session. Do 1-2 sessions per day.  At least 4 days a week.    http://orth.exer.us/1081   Copyright  VHI. All rights reserved.

## 2019-11-13 ENCOUNTER — Ambulatory Visit (HOSPITAL_COMMUNITY): Payer: Medicare HMO | Admitting: Physical Therapy

## 2019-11-18 ENCOUNTER — Ambulatory Visit (HOSPITAL_COMMUNITY): Payer: Medicare HMO | Admitting: Physical Therapy

## 2019-11-18 ENCOUNTER — Other Ambulatory Visit: Payer: Self-pay

## 2019-11-18 ENCOUNTER — Encounter (HOSPITAL_COMMUNITY): Payer: Self-pay | Admitting: Physical Therapy

## 2019-11-18 DIAGNOSIS — M25552 Pain in left hip: Secondary | ICD-10-CM | POA: Diagnosis not present

## 2019-11-18 DIAGNOSIS — R2689 Other abnormalities of gait and mobility: Secondary | ICD-10-CM

## 2019-11-18 DIAGNOSIS — M6281 Muscle weakness (generalized): Secondary | ICD-10-CM

## 2019-11-18 NOTE — Therapy (Signed)
[facility] [address] [place], [place], [address] Phone: [phone]   Fax:  [phone]  Physical Therapy Treatment  Patient Details  Name: Heather [name] MRN: [id number] Date of Birth: 04-16-1940 Referring Provider (PT): Jorge Mandril, NP   Encounter Date: 11/18/2019   PT End of Session - 11/18/19 0928    Visit Number 5    Number of Visits 12    Date for PT Re-Evaluation 12/09/19    Authorization Type [employer]    Authorization Time Period Approved 12 visits 8/24-10/07/2019    Authorization - Visit Number 5    Authorization - Number of Visits 12    Progress Note Due on Visit 12    PT Start Time 0906    PT Stop Time 0945    PT Time Calculation (min) 39 min    Activity Tolerance Patient tolerated treatment well    Behavior During Therapy [facility] for tasks assessed/performed           Past Medical History:  Diagnosis Date  . Anemia   . Anxiety   . Arthritis   . COPD (chronic obstructive pulmonary disease) (HCC)   . Depression   . GERD (gastroesophageal reflux disease)   . Glaucoma   . Gout   . Gout 10/23/2018  . HTN (hypertension)     Past Surgical History:  Procedure Laterality Date  . ABDOMINAL HYSTERECTOMY    . BACK SURGERY     lumbar-disc  . BUNIONECTOMY Bilateral   . FOOT SURGERY Left    repair of "kissing Cousins"  . JOINT REPLACEMENT Bilateral    hip   . JOINT REPLACEMENT Right 2010 or 2012   knee  . KNEE SURGERY    . SHOULDER ARTHROSCOPY Right    shoulder surgery in [place] about 2000  . SHOULDER HEMI-ARTHROPLASTY Left 03/25/2014   Procedure: LEFT SHOULDER HEMI-ARTHROPLASTY;  Surgeon: Vickki Hearing, MD;  Location: [facility];  Service: Orthopedics;  Laterality: Left;    There were no vitals filed for this visit.   Subjective Assessment - 11/18/19 0909    Subjective Patient reports that she is having 8/10 pain currently in her left hip. Patient reported that she did have x-rays and that the  hardward looked good.    Pertinent History History of bilateral hip THA, RT knee TKA, and back surgery    Diagnostic tests Plan to get an x-ray of hip and back on 10/28/19    Patient Stated Goals To have less left hip pain    Currently in Pain? Yes    Pain Score 8     Pain Location Hip    Pain Orientation Left    Pain Descriptors / Indicators Sharp    Pain Type Chronic pain    Pain Onset More than a month ago    Pain Frequency Intermittent                             [facility] Adult PT Treatment/Exercise - 11/18/19 0001      Knee/Hip Exercises: Supine   Short Arc Quad Sets Left;10 reps    Bridges Strengthening;Both;10 reps;2 [employer] Limitations 10" holds    Straight Leg Raises Right;2 sets;5 reps;Left    Straight Leg Raises Limitations Partial ROM on the LT. Cues for form    Other Supine Knee/Hip Exercises marching with ab set; 10x alternating LEs      Manual  Therapy   Manual Therapy Soft tissue mobilization    Manual therapy comments Performed separately from all other interventions    Soft tissue mobilization IASTM to Left Glutes with green weighted ball for pain relief                  PT Education - 11/18/19 0933    Education Details Discussed with patient continuing HEP            PT Short Term Goals - 10/28/19 1157      PT SHORT TERM GOAL #1   Title Patient will report understanding and regular compliance with HEP to improve strength, mobility and decrease pain.    Time 3    Period Weeks    Status New    Target Date 11/18/19      PT SHORT TERM GOAL #2   Title Patient will report improvement in overall subjective complaint of at least 25% for improved QOL.    Time 3    Period Weeks    Status New    Target Date 11/18/19             PT Long Term Goals - 10/28/19 1157      PT LONG TERM GOAL #1   Title Patient will report improvement in overall subjective complaint of at least 50% for improved QOL.    Time 6    Period  Weeks    Status New    Target Date 12/09/19      PT LONG TERM GOAL #2   Title Patient will demonstrate improvement on FOTO of at least 10% indicating improvement in overall functional mobility.    Time 6    Period Weeks    Status New    Target Date 12/09/19      PT LONG TERM GOAL #3   Title Patient will demonstrate ability to perform 5xSTS test by an improvement of at least 10 seconds and without any upper extremity assistance.    Time 6    Period Weeks    Status New    Target Date 12/09/19                 Plan - 11/18/19 0934    Clinical Impression Statement Patient reported a high level of left hip pain upon entering session. Began with massage for pain relief with patient reporting a significant improvement in pain. Patient demonstrated ability to perform straight leg raise on the left side with partial ROM. Patient would benefit from continued skilled physical therapy in order to continue progressing towards functional goals.    Comorbidities HTN, THA LT & RT, TKA RT, COPD    Examination-Activity Limitations Stand;Locomotion Level;Toileting;Bend;Transfers;Squat;Stairs    Examination-Participation Restrictions Cleaning;Meal Prep;Yard Work;Community Activity;Shop    Stability/Clinical Decision Making Evolving/Moderate complexity    Rehab Potential Good    PT Frequency 2x / week    PT Duration 6 weeks    PT Treatment/Interventions ADLs/Self Care Home Management;Aquatic Therapy;Cryotherapy;Electrical Stimulation;Moist Heat;DME Instruction;Gait training;Stair training;Functional mobility training;Therapeutic activities;Therapeutic exercise;Balance training;Neuromuscular re-education;Patient/family education;Orthotic Fit/Training;Manual techniques;Passive range of motion;Dry needling;Energy conservation;Taping    PT Next Visit Plan Continue to focus on LE strengthenging, gentle hip mobility exs, gait/balance training.    PT Home Exercise Plan 10/28/19: Henreitta Leber; 11/06/19 - supine  clams and bridge both w/ RTB; STS           Patient will benefit from skilled therapeutic intervention in order to improve the following deficits and impairments:  Abnormal gait, Improper body  mechanics, Pain, Decreased mobility, Decreased activity tolerance, Decreased endurance, Decreased range of motion, Decreased strength, Hypomobility, Difficulty walking  Visit Diagnosis: Pain in left hip  Other abnormalities of gait and mobility  Muscle weakness (generalized)     Problem List Patient Active Problem List   Diagnosis Date Noted  . Gout 10/24/2018  . HTN (hypertension) 04/17/2014  . Dry eyes 04/14/2014  . Bleeding from the nose 04/01/2014  . Renal insufficiency 04/01/2014  . Rotator cuff arthropathy   . Muscle weakness (generalized) 05/26/2013  . Pain in joint, shoulder region 05/26/2013  . Rotator cuff syndrome 05/22/2013   Verne Carrow PT, DPT 11:09 AM, 11/18/19 [phone]  [facility] [address] [place], [place], [address] Phone: [phone]   Fax:  [phone]  Name: CHERMAINE SCHNYDER MRN: [id number] Date of Birth: 1940-06-14

## 2019-11-19 ENCOUNTER — Encounter (HOSPITAL_COMMUNITY): Payer: Self-pay | Admitting: Physical Therapy

## 2019-11-19 ENCOUNTER — Ambulatory Visit (HOSPITAL_COMMUNITY): Payer: Medicare HMO | Admitting: Physical Therapy

## 2019-11-19 DIAGNOSIS — M25552 Pain in left hip: Secondary | ICD-10-CM

## 2019-11-19 DIAGNOSIS — R2689 Other abnormalities of gait and mobility: Secondary | ICD-10-CM

## 2019-11-19 DIAGNOSIS — M6281 Muscle weakness (generalized): Secondary | ICD-10-CM

## 2019-11-19 NOTE — Patient Instructions (Signed)
Access Code: URL: [email or web address] Date: 11/19/2019 Prepared by: Greig Castilla Lashaunda Schild  Exercises Supine Figure 4 Piriformis Stretch - 1 x daily - 7 x weekly - 3 sets - 10 reps Mini Squat with Counter Support - 1 x daily - 7 x weekly - 2 sets - 10 reps

## 2019-11-19 NOTE — Therapy (Signed)
[facility] [address] [place], [place], [address] Phone: [phone]   Fax:  [phone]  Physical Therapy Treatment  Patient Details  Name: Heather [name] MRN: [id number] Date of Birth: September 29, 1940 Referring Provider (PT): Jorge Mandril, NP   Encounter Date: 11/19/2019   PT End of Session - 11/19/19 0903    Visit Number 6    Number of Visits 12    Date for PT Re-Evaluation 12/09/19    Authorization Type [employer]    Authorization Time Period Approved 12 visits 8/24-10/07/2019    Authorization - Visit Number 6    Authorization - Number of Visits 12    Progress Note Due on Visit 12    PT Start Time 0905    PT Stop Time 0943    PT Time Calculation (min) 38 min    Activity Tolerance Patient tolerated treatment well    Behavior During Therapy [facility] for tasks assessed/performed           Past Medical History:  Diagnosis Date  . Anemia   . Anxiety   . Arthritis   . COPD (chronic obstructive pulmonary disease) (HCC)   . Depression   . GERD (gastroesophageal reflux disease)   . Glaucoma   . Gout   . Gout 10/23/2018  . HTN (hypertension)     Past Surgical History:  Procedure Laterality Date  . ABDOMINAL HYSTERECTOMY    . BACK SURGERY     lumbar-disc  . BUNIONECTOMY Bilateral   . FOOT SURGERY Left    repair of "kissing Cousins"  . JOINT REPLACEMENT Bilateral    hip   . JOINT REPLACEMENT Right 2010 or 2012   knee  . KNEE SURGERY    . SHOULDER ARTHROSCOPY Right    shoulder surgery in [place] about 2000  . SHOULDER HEMI-ARTHROPLASTY Left 03/25/2014   Procedure: LEFT SHOULDER HEMI-ARTHROPLASTY;  Surgeon: Vickki Hearing, MD;  Location: [facility];  Service: Orthopedics;  Laterality: Left;    There were no vitals filed for this visit.   Subjective Assessment - 11/19/19 0903    Subjective Patient states that she gets quite a bit of pain in her hip. She thinks sometimes what we have been doing hasn't really been  helping her pain. Her exercises have been going well.    Pertinent History History of bilateral hip THA, RT knee TKA, and back surgery    Diagnostic tests Plan to get an x-ray of hip and back on 10/28/19    Patient Stated Goals To have less left hip pain    Currently in Pain? Yes    Pain Score 8     Pain Location Hip    Pain Orientation Left    Pain Onset More than a month ago                             [facility] Adult PT Treatment/Exercise - 11/19/19 0001      Knee/Hip Exercises: Stretches   Piriformis Stretch 30 seconds;5 reps    Piriformis Stretch Limitations figure 4 in supine, assist with positioning and hold      Knee/Hip Exercises: Standing   Hip Flexion Both;2 sets;10 reps    Hip Flexion Limitations alternating march, unilateral UE support cueing for pelvic stability and posture    Functional Squat 2 sets;10 reps      Knee/Hip Exercises: Supine   Other Supine Knee/Hip Exercises hip abduction isometric with  belt 10 x 10 second holds      Knee/Hip Exercises: Sidelying   Clams 2x10 L                   PT Education - 11/19/19 0903    Education Details Patient educated on HEP, exercise mechanics    Person(s) Educated Patient    Methods Explanation;Demonstration    Comprehension Verbalized understanding;Returned demonstration            PT Short Term Goals - 10/28/19 1157      PT SHORT TERM GOAL #1   Title Patient will report understanding and regular compliance with HEP to improve strength, mobility and decrease pain.    Time 3    Period Weeks    Status New    Target Date 11/18/19      PT SHORT TERM GOAL #2   Title Patient will report improvement in overall subjective complaint of at least 25% for improved QOL.    Time 3    Period Weeks    Status New    Target Date 11/18/19             PT Long Term Goals - 10/28/19 1157      PT LONG TERM GOAL #1   Title Patient will report improvement in overall subjective complaint of at least  50% for improved QOL.    Time 6    Period Weeks    Status New    Target Date 12/09/19      PT LONG TERM GOAL #2   Title Patient will demonstrate improvement on FOTO of at least 10% indicating improvement in overall functional mobility.    Time 6    Period Weeks    Status New    Target Date 12/09/19      PT LONG TERM GOAL #3   Title Patient will demonstrate ability to perform 5xSTS test by an improvement of at least 10 seconds and without any upper extremity assistance.    Time 6    Period Weeks    Status New    Target Date 12/09/19                 Plan - 11/19/19 0904    Clinical Impression Statement She states pain in piriformis area at beginning of session. Patient requires manual assistance to get LLE in figure 4 position and hold it there secondary to impaired mobility. Patient showing improving flexion and ER ROM after repetitions of figure 4 stretch. After several reps, patient able to complete with towel and cueing. Patient states pain "pretty much gone" following stretch. Patient educated on completing with towel at home. Patient has difficulty with sidelying secondary to hip pain bilaterally. She is able to complete 2 sets of clams in sidelying on LLE within limited range but good mechanics and glute activation with cueing. Patient requires min verbal/tactile cueing and prior demonstration for squat mechanics. Patient symptoms abolished at end of session. Patient will continue to benefit from skilled physical therapy in order to reduce impairment and improve function.    Comorbidities HTN, THA LT & RT, TKA RT, COPD    Examination-Activity Limitations Stand;Locomotion Level;Toileting;Bend;Transfers;Squat;Stairs    Examination-Participation Restrictions Cleaning;Meal Prep;Yard Work;Community Activity;Shop    Stability/Clinical Decision Making Evolving/Moderate complexity    Rehab Potential Good    PT Frequency 2x / week    PT Duration 6 weeks    PT Treatment/Interventions  ADLs/Self Care Home Management;Aquatic Therapy;Cryotherapy;Electrical Stimulation;Moist Heat;DME Instruction;Gait training;Stair training;Functional  mobility training;Therapeutic activities;Therapeutic exercise;Balance training;Neuromuscular re-education;Patient/family education;Orthotic Fit/Training;Manual techniques;Passive range of motion;Dry needling;Energy conservation;Taping    PT Next Visit Plan Continue to focus on LE strengthenging, gentle hip mobility exs, gait/balance training.    PT Home Exercise Plan 10/28/19: Henreitta Leber; 11/06/19 - supine clams and bridge both w/ RTB; STS 9/15 firgure 4 stretch, squat           Patient will benefit from skilled therapeutic intervention in order to improve the following deficits and impairments:  Abnormal gait, Improper body mechanics, Pain, Decreased mobility, Decreased activity tolerance, Decreased endurance, Decreased range of motion, Decreased strength, Hypomobility, Difficulty walking  Visit Diagnosis: Pain in left hip  Other abnormalities of gait and mobility  Muscle weakness (generalized)     Problem List Patient Active Problem List   Diagnosis Date Noted  . Gout 10/24/2018  . HTN (hypertension) 04/17/2014  . Dry eyes 04/14/2014  . Bleeding from the nose 04/01/2014  . Renal insufficiency 04/01/2014  . Rotator cuff arthropathy   . Muscle weakness (generalized) 05/26/2013  . Pain in joint, shoulder region 05/26/2013  . Rotator cuff syndrome 05/22/2013    9:44 AM, 11/19/19 Wyman Songster PT, DPT Physical Therapist at [facility]  [facility] [address] [place], [place], [address] Phone: [phone]   Fax:  [phone]  Name: Heather [name] MRN: [id number] Date of Birth: Dec 02, 1940

## 2019-11-20 ENCOUNTER — Ambulatory Visit (HOSPITAL_COMMUNITY): Payer: Medicare HMO | Admitting: Physical Therapy

## 2019-11-25 ENCOUNTER — Other Ambulatory Visit: Payer: Self-pay

## 2019-11-25 ENCOUNTER — Ambulatory Visit (HOSPITAL_COMMUNITY): Payer: Medicare HMO | Admitting: Physical Therapy

## 2019-11-25 DIAGNOSIS — M25552 Pain in left hip: Secondary | ICD-10-CM

## 2019-11-25 DIAGNOSIS — M6281 Muscle weakness (generalized): Secondary | ICD-10-CM

## 2019-11-25 DIAGNOSIS — R2689 Other abnormalities of gait and mobility: Secondary | ICD-10-CM

## 2019-11-25 NOTE — Therapy (Signed)
[facility] [address] [place], [place], [address] Phone: [phone]   Fax:  [phone]  Physical Therapy Treatment  Patient Details  Name: Heather [name] MRN: [id number] Date of Birth: [birth year] Referring Provider (PT): Jorge Mandril, NP   Encounter Date: 11/25/2019   PT End of Session - 11/25/19 1217    Visit Number 7    Number of Visits 12    Date for PT Re-Evaluation 12/09/19    Authorization Type [employer]    Authorization Time Period Approved 12 visits 8/24-10/07/2019    Authorization - Visit Number 7    Authorization - Number of Visits 12    Progress Note Due on Visit 12    PT Start Time 1136    PT Stop Time 1216    PT Time Calculation (min) 40 min    Activity Tolerance Patient tolerated treatment well    Behavior During Therapy [facility] for tasks assessed/performed           Past Medical History:  Diagnosis Date  . Anemia   . Anxiety   . Arthritis   . COPD (chronic obstructive pulmonary disease) (HCC)   . Depression   . GERD (gastroesophageal reflux disease)   . Glaucoma   . Gout   . Gout 10/23/2018  . HTN (hypertension)     Past Surgical History:  Procedure Laterality Date  . ABDOMINAL HYSTERECTOMY    . BACK SURGERY     lumbar-disc  . BUNIONECTOMY Bilateral   . FOOT SURGERY Left    repair of "kissing Cousins"  . JOINT REPLACEMENT Bilateral    hip   . JOINT REPLACEMENT Right 2010 or 2012   knee  . KNEE SURGERY    . SHOULDER ARTHROSCOPY Right    shoulder surgery in [place] about 2000  . SHOULDER HEMI-ARTHROPLASTY Left 03/25/2014   Procedure: LEFT SHOULDER HEMI-ARTHROPLASTY;  Surgeon: Vickki Hearing, MD;  Location: [facility];  Service: Orthopedics;  Laterality: Left;    There were no vitals filed for this visit.   Subjective Assessment - 11/25/19 1141    Subjective pt states she is doing well and not having any pain.    Currently in Pain? No/denies                              [facility] Adult PT Treatment/Exercise - 11/25/19 0001      Knee/Hip Exercises: Standing   Hip Flexion Both;2 sets;10 reps    Hip Flexion Limitations alternating march, unilateral UE support cueing for pelvic stability and posture    Hip Abduction Both;2 sets;10 reps    Hip Extension Both;2 sets;10 reps    Functional Squat 2 sets;10 reps      Knee/Hip Exercises: Seated   Sit to Sand 2 sets;10 reps;without UE support      Knee/Hip Exercises: Supine   Bridges Strengthening;Both;10 reps;2 sets    Bridges Limitations 10" holds    Straight Leg Raises Both;2 sets;10 reps    Straight Leg Raises Limitations full ROM      Knee/Hip Exercises: Sidelying   Clams 2x10 bilatearlly                    PT Short Term Goals - 10/28/19 1157      PT SHORT TERM GOAL #1   Title Patient will report understanding and regular compliance with HEP to improve strength, mobility and decrease pain.  Time 3    Period Weeks    Status New    Target Date 11/18/19      PT SHORT TERM GOAL #2   Title Patient will report improvement in overall subjective complaint of at least 25% for improved QOL.    Time 3    Period Weeks    Status New    Target Date 11/18/19             PT Long Term Goals - 10/28/19 1157      PT LONG TERM GOAL #1   Title Patient will report improvement in overall subjective complaint of at least 50% for improved QOL.    Time 6    Period Weeks    Status New    Target Date 12/09/19      PT LONG TERM GOAL #2   Title Patient will demonstrate improvement on FOTO of at least 10% indicating improvement in overall functional mobility.    Time 6    Period Weeks    Status New    Target Date 12/09/19      PT LONG TERM GOAL #3   Title Patient will demonstrate ability to perform 5xSTS test by an improvement of at least 10 seconds and without any upper extremity assistance.    Time 6    Period Weeks    Status New    Target Date 12/09/19                  Plan - 11/25/19 1216    Clinical Impression Statement continued focus on improving LE strength and stability.   Pt able to complete 2 seats of each exercise in standing.  Added hip abductor and extensor strengthening with cues for form.   Squat form incorrect initially with too much weight forward, however able to correct with cues.   Overall improving with ability to complete all exercises without pain.  Now able to complete straight leg raises bilaterally in full  Noted fatigue at end of session.    Comorbidities HTN, THA LT & RT, TKA RT, COPD    Examination-Activity Limitations Stand;Locomotion Level;Toileting;Bend;Transfers;Squat;Stairs    Examination-Participation Restrictions Cleaning;Meal Prep;Yard Work;Community Activity;Shop    Stability/Clinical Decision Making Evolving/Moderate complexity    Rehab Potential Good    PT Frequency 2x / week    PT Duration 6 weeks    PT Treatment/Interventions ADLs/Self Care Home Management;Aquatic Therapy;Cryotherapy;Electrical Stimulation;Moist Heat;DME Instruction;Gait training;Stair training;Functional mobility training;Therapeutic activities;Therapeutic exercise;Balance training;Neuromuscular re-education;Patient/family education;Orthotic Fit/Training;Manual techniques;Passive range of motion;Dry needling;Energy conservation;Taping    PT Next Visit Plan Continue to focus on LE strengthenging, gentle hip mobility exs, gait/balance training.    PT Home Exercise Plan 10/28/19: Henreitta Leber; 11/06/19 - supine clams and bridge both w/ RTB; STS 9/15 firgure 4 stretch, squat           Patient will benefit from skilled therapeutic intervention in order to improve the following deficits and impairments:  Abnormal gait, Improper body mechanics, Pain, Decreased mobility, Decreased activity tolerance, Decreased endurance, Decreased range of motion, Decreased strength, Hypomobility, Difficulty walking  Visit Diagnosis: Pain in left hip  Other  abnormalities of gait and mobility  Muscle weakness (generalized)     Problem List Patient Active Problem List   Diagnosis Date Noted  . Gout 10/24/2018  . HTN (hypertension) 04/17/2014  . Dry eyes 04/14/2014  . Bleeding from the nose 04/01/2014  . Renal insufficiency 04/01/2014  . Rotator cuff arthropathy   . Muscle weakness (generalized) 05/26/2013  . Pain  in joint, shoulder region 05/26/2013  . Rotator cuff syndrome 05/22/2013   Lurena Nida, PTA/CLT [phone]  Lurena Nida 11/25/2019, 12:18 PM  [facility] [address] [place], [place], [address] Phone: [phone]   Fax:  [phone]  Name: Heather [name] MRN: [id number] Date of Birth: [birth year]

## 2019-11-27 ENCOUNTER — Ambulatory Visit (HOSPITAL_COMMUNITY): Payer: Medicare HMO | Admitting: Physical Therapy

## 2019-11-27 ENCOUNTER — Other Ambulatory Visit: Payer: Self-pay

## 2019-11-27 DIAGNOSIS — M25552 Pain in left hip: Secondary | ICD-10-CM | POA: Diagnosis not present

## 2019-11-27 DIAGNOSIS — R2689 Other abnormalities of gait and mobility: Secondary | ICD-10-CM

## 2019-11-27 DIAGNOSIS — M6281 Muscle weakness (generalized): Secondary | ICD-10-CM

## 2019-11-27 NOTE — Therapy (Signed)
[facility] [address] [place], [place], [address] Phone: [phone]   Fax:  [phone]  Physical Therapy Treatment  Patient Details  Name: Heather [name] MRN: [id number] Date of Birth: [birth year] Referring Provider (PT): Jorge Mandril, NP   Encounter Date: 11/27/2019   PT End of Session - 11/27/19 1043    Visit Number 8    Number of Visits 12    Date for PT Re-Evaluation 12/09/19    Authorization Type [employer]    Authorization Time Period Approved 12 visits 8/24-10/07/2019    Authorization - Visit Number 8    Authorization - Number of Visits 12    Progress Note Due on Visit 12    PT Start Time 1005    PT Stop Time 1045    PT Time Calculation (min) 40 min    Activity Tolerance Patient tolerated treatment well    Behavior During Therapy [facility] for tasks assessed/performed           Past Medical History:  Diagnosis Date  . Anemia   . Anxiety   . Arthritis   . COPD (chronic obstructive pulmonary disease) (HCC)   . Depression   . GERD (gastroesophageal reflux disease)   . Glaucoma   . Gout   . Gout 10/23/2018  . HTN (hypertension)     Past Surgical History:  Procedure Laterality Date  . ABDOMINAL HYSTERECTOMY    . BACK SURGERY     lumbar-disc  . BUNIONECTOMY Bilateral   . FOOT SURGERY Left    repair of "kissing Cousins"  . JOINT REPLACEMENT Bilateral    hip   . JOINT REPLACEMENT Right 2010 or 2012   knee  . KNEE SURGERY    . SHOULDER ARTHROSCOPY Right    shoulder surgery in [place] about 2000  . SHOULDER HEMI-ARTHROPLASTY Left 03/25/2014   Procedure: LEFT SHOULDER HEMI-ARTHROPLASTY;  Surgeon: Vickki Hearing, MD;  Location: [facility];  Service: Orthopedics;  Laterality: Left;    There were no vitals filed for this visit.   Subjective Assessment - 11/27/19 1040    Subjective Lt hip pain is intermittent in Lt hip at this point with need to use SPC when present. Currently 7/10.    Currently in Pain? Yes     Pain Score 7     Pain Location Hip    Pain Orientation Left    Pain Descriptors / Indicators Aching;Clance Boll Adult PT Treatment/Exercise - 11/27/19 0001      Knee/Hip Exercises: Stretches   Other Knee/Hip Stretches single knee to chest 5X15" holds      Knee/Hip Exercises: Standing   Hip Flexion Both;2 sets;10 reps    Hip Flexion Limitations alternating march, unilateral UE support cueing for pelvic stability and posture    Hip Abduction Both;2 sets;10 reps    Hip Extension Both;2 sets;10 reps    Functional Squat 2 sets;10 reps      Knee/Hip Exercises: Seated   Sit to Sand 2 sets;10 reps;without UE support      Knee/Hip Exercises: Supine   Bridges Strengthening;Both;10 reps;2 sets    Bridges Limitations 10" holds    Straight Leg Raises Both;2 sets;10 reps    Straight Leg Raises Limitations full ROM      Knee/Hip Exercises: Sidelying   Hip ABduction 2 sets;10 reps;Left  Clams Lt only 10x5" holds                    PT Short Term Goals - 10/28/19 1157      PT SHORT TERM GOAL #1   Title Patient will report understanding and regular compliance with HEP to improve strength, mobility and decrease pain.    Time 3    Period Weeks    Status New    Target Date 11/18/19      PT SHORT TERM GOAL #2   Title Patient will report improvement in overall subjective complaint of at least 25% for improved QOL.    Time 3    Period Weeks    Status New    Target Date 11/18/19             PT Long Term Goals - 10/28/19 1157      PT LONG TERM GOAL #1   Title Patient will report improvement in overall subjective complaint of at least 50% for improved QOL.    Time 6    Period Weeks    Status New    Target Date 12/09/19      PT LONG TERM GOAL #2   Title Patient will demonstrate improvement on FOTO of at least 10% indicating improvement in overall functional mobility.    Time 6    Period Weeks    Status New    Target  Date 12/09/19      PT LONG TERM GOAL #3   Title Patient will demonstrate ability to perform 5xSTS test by an improvement of at least 10 seconds and without any upper extremity assistance.    Time 6    Period Weeks    Status New    Target Date 12/09/19                 Plan - 11/27/19 1044    Clinical Impression Statement Continued with focus on improving bil LE strength and stability.  Much improved ability to complete standing  Lt hip exercises in increased ROM.  Added bil knee to chest stretch for tight hip mm and Rt side lying Lt hip abd to progress against gravity for improved strength.  No change in symptoms at end of session today.    Comorbidities HTN, THA LT & RT, TKA RT, COPD    Examination-Activity Limitations Stand;Locomotion Level;Toileting;Bend;Transfers;Squat;Stairs    Examination-Participation Restrictions Cleaning;Meal Prep;Yard Work;Community Activity;Shop    Stability/Clinical Decision Making Evolving/Moderate complexity    Rehab Potential Good    PT Frequency 2x / week    PT Duration 6 weeks    PT Treatment/Interventions ADLs/Self Care Home Management;Aquatic Therapy;Cryotherapy;Electrical Stimulation;Moist Heat;DME Instruction;Gait training;Stair training;Functional mobility training;Therapeutic activities;Therapeutic exercise;Balance training;Neuromuscular re-education;Patient/family education;Orthotic Fit/Training;Manual techniques;Passive range of motion;Dry needling;Energy conservation;Taping    PT Next Visit Plan Continue to focus on LE strengthenging, gentle hip mobility exs, gait/balance training.    PT Home Exercise Plan 10/28/19: Henreitta Leber; 11/06/19 - supine clams and bridge both w/ RTB; STS 9/15 firgure 4 stretch, squat           Patient will benefit from skilled therapeutic intervention in order to improve the following deficits and impairments:  Abnormal gait, Improper body mechanics, Pain, Decreased mobility, Decreased activity tolerance, Decreased  endurance, Decreased range of motion, Decreased strength, Hypomobility, Difficulty walking  Visit Diagnosis: Other abnormalities of gait and mobility  Muscle weakness (generalized)  Pain in left hip     Problem List Patient Active Problem List   Diagnosis  Date Noted  . Gout 10/24/2018  . HTN (hypertension) 04/17/2014  . Dry eyes 04/14/2014  . Bleeding from the nose 04/01/2014  . Renal insufficiency 04/01/2014  . Rotator cuff arthropathy   . Muscle weakness (generalized) 05/26/2013  . Pain in joint, shoulder region 05/26/2013  . Rotator cuff syndrome 05/22/2013   Lurena Nida, PTA/CLT [phone]  Lurena Nida 11/27/2019, 10:45 AM  [facility] [address] [place], [place], [address] Phone: [phone]   Fax:  [phone]  Name: CAYLEE VLACHOS MRN: [id number] Date of Birth: 1941-01-11

## 2019-12-01 ENCOUNTER — Other Ambulatory Visit: Payer: Self-pay

## 2019-12-01 ENCOUNTER — Ambulatory Visit: Payer: Medicare Other | Admitting: Podiatry

## 2019-12-01 DIAGNOSIS — L84 Corns and callosities: Secondary | ICD-10-CM

## 2019-12-01 DIAGNOSIS — M79671 Pain in right foot: Secondary | ICD-10-CM | POA: Diagnosis not present

## 2019-12-01 DIAGNOSIS — M2031 Hallux varus (acquired), right foot: Secondary | ICD-10-CM | POA: Diagnosis not present

## 2019-12-01 DIAGNOSIS — M79672 Pain in left foot: Secondary | ICD-10-CM

## 2019-12-01 DIAGNOSIS — L02612 Cutaneous abscess of left foot: Secondary | ICD-10-CM | POA: Diagnosis not present

## 2019-12-01 NOTE — Progress Notes (Signed)
   HPI: 79 y.o. female presenting today for follow-up evaluation of multiple complaints to the bilateral feet.  Patient does have a history of bunion and hammertoe surgery to the right foot several years ago.  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  She states that there is a prominent bone sticking out of her right great toe that is very painful and symptomatic. Patient states that she is feeling much better.  She denies having any drainage or pain to her left hallux.  No new complaints at this time  Past Medical History:  Diagnosis Date  . Anemia   . Anxiety   . Arthritis   . COPD (chronic obstructive pulmonary disease) (HCC)   . Depression   . GERD (gastroesophageal reflux disease)   . Glaucoma   . Gout   . Gout 10/23/2018  . HTN (hypertension)      Physical Exam: General: The patient is alert and oriented x3 in no acute distress.  Dermatology: Skin is warm, dry and supple bilateral lower extremities. Negative for open lesions or macerations.  Hyperkeratotic preulcerative callus lesion noted to the lateral aspect of the fifth MTPJ right foot. There is some fluctuance noted to the dorsal aspect of the left hallux with a draining sinus consistent with possible abscess/cellulitis.  It is very localized around the dorsal aspect of the toe.  To the small lesion there is no drainage.  It appears that is completely healed.  No malodor noted.  Vascular: Palpable pedal pulses bilaterally. No edema or erythema noted. Capillary refill within normal limits.  Neurological: Epicritic and protective threshold grossly intact bilaterally.   Musculoskeletal Exam: History of bunion and hammertoe correction right foot with iatrogenic development of hallux varus right.  Contracture at the level of the IPJ noted to the right hallux with a prominent metatarsal head that appears to be irritated and rubbing with shoe gear   Assessment: 1.  Hallux varus right 2.  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  Preulcerative callus lesion right fifth MTPJ 4.  Cellulitis/abscess localized to the dorsum of the left hallux-resolved  Plan of Care:  1. Patient evaluated.  2.  Continue silicone toe cap and good supportive shoes daily 3.  Patient completed her oral antibiotic.  It appears that the abscess is completely resolved. 4.  Return to clinic as needed      Felecia Shelling, DPM [facility]  Dr. Felecia Shelling, DPM    [address] [place], [place] [address]                Office [phone]  Fax [phone]

## 2019-12-02 ENCOUNTER — Ambulatory Visit (HOSPITAL_COMMUNITY): Payer: Medicare HMO | Admitting: Physical Therapy

## 2019-12-02 ENCOUNTER — Encounter (HOSPITAL_COMMUNITY): Payer: Self-pay | Admitting: Physical Therapy

## 2019-12-02 DIAGNOSIS — M25552 Pain in left hip: Secondary | ICD-10-CM

## 2019-12-02 DIAGNOSIS — R2689 Other abnormalities of gait and mobility: Secondary | ICD-10-CM

## 2019-12-02 DIAGNOSIS — M6281 Muscle weakness (generalized): Secondary | ICD-10-CM

## 2019-12-02 NOTE — Patient Instructions (Signed)
Access Code: [email or web address] URL: [email or web address] Date: 12/02/2019 Prepared by: Georges Lynch  Exercises Sit to Stand with Counter Support - 1 x daily - 5 x weekly - 1 sets - 10 reps Heel rises with counter support - 1 x daily - 5 x weekly - 2 sets - 10 reps Standing Hip Abduction with Counter Support - 1 x daily - 5 x weekly - 2 sets - 10 reps Standing Tandem Balance with Counter Support - 1 x daily - 5 x weekly - 1 sets - 3 reps - 15-20 sec hold

## 2019-12-02 NOTE — Therapy (Signed)
[place], [place], [address] Phone: [phone]   Fax:  [phone]  Physical Therapy Treatment  Patient Details  Name: Heather [name] MRN: [id number] Date of Birth: Nov 14, 1940 Referring Provider (PT): Barton Fanny, NP  PHYSICAL THERAPY DISCHARGE SUMMARY  Visits from Start of Care: 9  Current functional level related to goals / functional outcomes: See below    Remaining deficits: See below    Education / Equipment: See assessment  Plan: Patient agrees to discharge.  Patient goals were partially met. Patient is being discharged due to being pleased with the current functional level.  ?????       Encounter Date: 12/02/2019   PT End of Session - 12/02/19 1043    Visit Number 9    Number of Visits 12    Date for PT Re-Evaluation 12/09/19    Authorization Type [employer]    Authorization Time Period Approved 12 visits 8/24-10/07/2019    Authorization - Visit Number 9    Authorization - Number of Visits 12    Progress Note Due on Visit 12    PT Start Time 7322    PT Stop Time 1120    PT Time Calculation (min) 40 min    Activity Tolerance Patient tolerated treatment well    Behavior During Therapy WFL for tasks assessed/performed           Past Medical History:  Diagnosis Date  . Anemia   . Anxiety   . Arthritis   . COPD (chronic obstructive pulmonary disease) ([facility])   . Depression   . GERD (gastroesophageal reflux disease)   . Glaucoma   . Gout   . Gout 10/23/2018  . HTN (hypertension)     Past Surgical History:  Procedure Laterality Date  . ABDOMINAL HYSTERECTOMY    . BACK SURGERY     lumbar-disc  . BUNIONECTOMY Bilateral   . FOOT SURGERY Left    repair of "kissing Cousins"  . JOINT REPLACEMENT Bilateral    hip   . JOINT REPLACEMENT Right 2010 or 2012   knee  . KNEE SURGERY    . SHOULDER ARTHROSCOPY Right    shoulder surgery in [place] about 2000  . SHOULDER  HEMI-ARTHROPLASTY Left 03/25/2014   Procedure: LEFT SHOULDER HEMI-ARTHROPLASTY;  Surgeon: Carole Civil, MD;  Location: [facility];  Service: Orthopedics;  Laterality: Left;    There were no vitals filed for this visit.   Subjective Assessment - 12/02/19 1042    Subjective Patient says she is doing better. Says she has been walking more without cane. Reports no hip pain currently. Patient reports 90% improvement since starting therapy and feels ready to be DC today.    How long can you stand comfortably? 15-20 minutes    How long can you walk comfortably? 30 min with no AD    Currently in Pain? No/denies              [facility] PT Assessment - 12/02/19 0001      Assessment   Medical Diagnosis Left hip pain    Referring Provider (PT) Barton Fanny, NP    Onset Date/Surgical Date 08/28/19    Next MD Visit 12/28/19    Prior Therapy Yes, for back and shoulder      Precautions   Precautions None      Restrictions   Weight Bearing Restrictions No      Balance Screen   Has the patient fallen in  the past 6 months No      Home Ecologist residence    Living Arrangements Alone      Prior Function   Level of Independence Independent      Cognition   Overall Cognitive Status Within Functional Limits for tasks assessed      Observation/Other Assessments   Focus on Therapeutic Outcomes (FOTO)  22% limited    was 54% limited      Strength   Overall Strength Comments tested in seated     Right Hip Flexion 4+/5   was 4   Right Hip ABduction 4+/5   was 4-   Right Hip ADduction 5/5    Left Hip Flexion 4/5   was 4-   Left Hip ABduction 4+/5   was 4-   Left Hip ADduction 5/5    Right Knee Extension 4+/5    Left Knee Extension 4+/5    Right Ankle Dorsiflexion 5/5   was 4+   Left Ankle Dorsiflexion 4+/5      Transfers   Five time sit to stand comments  20 sec with no UEs       Ambulation/Gait   Ambulation/Gait Yes    Ambulation/Gait Assistance 7:  Independent    Ambulation Distance (Feet) 350 Feet    Assistive device None    Gait Pattern Trendelenburg;Decreased dorsiflexion - left    Ambulation Surface Level;Indoor    Gait Comments 2MWT      Balance   Balance Assessed Yes      Static Standing Balance   Static Standing Balance -  Activities  Tandam Stance - Right Leg;Tandam Stance - Left Leg    Static Standing - Comment/# of Minutes 15 sec bilat                         [facility] Adult PT Treatment/Exercise - 12/02/19 0001      Knee/Hip Exercises: Stretches   Other Knee/Hip Stretches single knee to chest 5X10" holds      Knee/Hip Exercises: Standing   Heel Raises 1 set;10 reps    Hip Abduction 1 set;10 reps;Both      Knee/Hip Exercises: Supine   Bridges Both;2 sets;10 reps    Straight Leg Raises Both;2 sets;10 reps                  PT Education - 12/02/19 1055    Education Details On progress toward therapy goals and transition to HEP for DC today    Person(s) Educated Patient    Methods Explanation    Comprehension Verbalized understanding            PT Short Term Goals - 12/02/19 1111      PT SHORT TERM GOAL #1   Title Patient will report understanding and regular compliance with HEP to improve strength, mobility and decrease pain.    Baseline Reports compliance    Time 3    Period Weeks    Status Achieved    Target Date 11/18/19      PT SHORT TERM GOAL #2   Title Patient will report improvement in overall subjective complaint of at least 25% for improved QOL.    Baseline Reports 90% improved    Time 3    Period Weeks    Status Achieved    Target Date 11/18/19             PT Long Term Goals - 12/02/19  [place] #1   Title Patient will report improvement in overall subjective complaint of at least 50% for improved QOL.    Baseline Reports 90% improved    Time 6    Period Weeks    Status Achieved      PT LONG TERM GOAL #2   Title Patient will  demonstrate improvement on FOTO of at least 10% indicating improvement in overall functional mobility.    Baseline Improved by 32%    Time 6    Period Weeks    Status Achieved      PT LONG TERM GOAL #3   Title Patient will demonstrate ability to perform 5xSTS test by an improvement of at least 10 seconds and without any upper extremity assistance.    Baseline Improved by 5.5 seconds, but is now able to complete with no UEs, and good control    Time 6    Period Weeks    Status Partially Met                 Plan - 12/02/19 1416    Clinical Impression Statement Patient demos good progress toward therapy goals. Patient currently with all goals met/ partially met. Patient shows improved strength, overall functional mobility and decreased pain levels. Patient still with slight gait abnormality but ambulates steadily with no AD. Patient request DC today per being pleased with current level of function. Patient educated on and issued updated HEP handout for continued strengthening and balance. Patient instructed to follow up with therapy services with any further questions or concerns.    Comorbidities HTN, THA LT & RT, TKA RT, COPD    Examination-Activity Limitations Stand;Locomotion Level;Toileting;Bend;Transfers;Squat;Stairs    Examination-Participation Restrictions Cleaning;Meal Prep;Yard Work;Community Activity;Shop    Stability/Clinical Decision Making Evolving/Moderate complexity    Rehab Potential Good    PT Treatment/Interventions ADLs/Self Care Home Management;Aquatic Therapy;Cryotherapy;Electrical Stimulation;Moist Heat;DME Instruction;Gait training;Stair training;Functional mobility training;Therapeutic activities;Therapeutic exercise;Balance training;Neuromuscular re-education;Patient/family education;Orthotic Fit/Training;Manual techniques;Passive range of motion;Dry needling;Energy conservation;Taping    PT Next Visit Plan DC to HEP    PT Home Exercise Plan 10/28/19: Constance Haw;  11/06/19 - supine clams and bridge both w/ RTB; STS 9/15 firgure 4 stretch, squat    Consulted and Agree with Plan of Care Patient           Patient will benefit from skilled therapeutic intervention in order to improve the following deficits and impairments:  Abnormal gait, Improper body mechanics, Pain, Decreased mobility, Decreased activity tolerance, Decreased endurance, Decreased range of motion, Decreased strength, Hypomobility, Difficulty walking  Visit Diagnosis: Other abnormalities of gait and mobility  Muscle weakness (generalized)  Pain in left hip     Problem List Patient Active Problem List   Diagnosis Date Noted  . Gout 10/24/2018  . HTN (hypertension) 04/17/2014  . Dry eyes 04/14/2014  . Bleeding from the nose 04/01/2014  . Renal insufficiency 04/01/2014  . Rotator cuff arthropathy   . Muscle weakness (generalized) 05/26/2013  . Pain in joint, shoulder region 05/26/2013  . Rotator cuff syndrome 05/22/2013    2:21 PM, 12/02/19 Josue Hector PT DPT  Physical Therapist with [facility]  [phone] [place] [address] [place], [place], [address] Phone: [phone]   Fax:  [phone]  Name: Heather [name] MRN: [id number] Date of Birth: [birth year]

## 2019-12-04 ENCOUNTER — Encounter (HOSPITAL_COMMUNITY): Payer: Medicare HMO | Admitting: Physical Therapy

## 2019-12-04 ENCOUNTER — Ambulatory Visit: Payer: Medicare HMO | Attending: Internal Medicine

## 2019-12-04 ENCOUNTER — Ambulatory Visit (HOSPITAL_COMMUNITY): Payer: Medicare HMO | Admitting: Physical Therapy

## 2019-12-04 DIAGNOSIS — Z23 Encounter for immunization: Secondary | ICD-10-CM

## 2019-12-04 NOTE — Progress Notes (Signed)
   Covid-19 Vaccination Clinic  Name:  Heather [name]    MRN: [id number] DOB: [birth year]  12/04/2019  Heather [name] was observed post Covid-19 immunization for 15 minutes without incident. She was provided with Vaccine Information Sheet and instruction to access the V-Safe system.   Heather [name] was instructed to call 911 with any severe reactions post vaccine: [name] Difficulty breathing  . Swelling of face and throat  . A fast heartbeat  . A bad rash all over body  . Dizziness and weakness

## 2019-12-09 ENCOUNTER — Encounter (HOSPITAL_COMMUNITY): Payer: Medicare HMO | Admitting: Physical Therapy

## 2019-12-22 ENCOUNTER — Other Ambulatory Visit (HOSPITAL_COMMUNITY): Payer: Self-pay | Admitting: Neurology

## 2019-12-22 DIAGNOSIS — M25552 Pain in left hip: Secondary | ICD-10-CM

## 2020-01-12 ENCOUNTER — Ambulatory Visit: Payer: Medicare HMO | Admitting: Orthopedic Surgery

## 2020-01-12 ENCOUNTER — Ambulatory Visit: Payer: Medicare HMO

## 2020-01-12 ENCOUNTER — Other Ambulatory Visit: Payer: Self-pay

## 2020-01-12 VITALS — BP 122/63 | HR 74 | Resp 16 | Ht 62.0 in | Wt 189.4 lb

## 2020-01-12 DIAGNOSIS — Z96651 Presence of right artificial knee joint: Secondary | ICD-10-CM

## 2020-01-12 DIAGNOSIS — M25561 Pain in right knee: Secondary | ICD-10-CM

## 2020-01-12 DIAGNOSIS — M25562 Pain in left knee: Secondary | ICD-10-CM

## 2020-01-12 MED ORDER — METHOCARBAMOL 500 MG PO TABS: 500.0000 mg | ORAL_TABLET | Freq: Three times a day (TID) | ORAL | 0 refills | Status: DC

## 2020-01-12 NOTE — Progress Notes (Signed)
Chief Complaint  Patient presents with  . Knee Pain    right knee/reoccuring/8/10/pain started a couple weeks ago   79 year old female status post right total knee replacement seen a few years ago and found to have a cyst in the proximal lateral tibia presents with a 2-week history of medial lateral knee pain around the patella  No other history of trauma  Allergies  Allergen Reactions  . Lisinopril   . Lovastatin Swelling    Patient states that her tongue swells and she gets a tingling feeling all over.     Review of Systems  Musculoskeletal: Positive for back pain and joint pain.   Past Medical History:  Diagnosis Date  . Anemia   . Anxiety   . Arthritis   . COPD (chronic obstructive pulmonary disease) ([facility])   . Depression   . GERD (gastroesophageal reflux disease)   . Glaucoma   . Gout   . Gout 10/23/2018  . HTN (hypertension)     Past Surgical History:  Procedure Laterality Date  . ABDOMINAL HYSTERECTOMY    . BACK SURGERY     lumbar-disc  . BUNIONECTOMY Bilateral   . FOOT SURGERY Left    repair of "kissing Cousins"  . JOINT REPLACEMENT Bilateral    hip   . JOINT REPLACEMENT Right 2010 or 2012   knee  . KNEE SURGERY    . SHOULDER ARTHROSCOPY Right    shoulder surgery in [place] about 2000  . SHOULDER HEMI-ARTHROPLASTY Left 03/25/2014   Procedure: LEFT SHOULDER HEMI-ARTHROPLASTY;  Surgeon: Vickki Hearing, MD;  Location: [facility];  Service: Orthopedics;  Laterality: Left;     Current Outpatient Medications:  .  allopurinol (ZYLOPRIM) 300 MG tablet, , Disp: , Rfl:  .  amLODipine (NORVASC) 5 MG tablet, Take 1 tablet by mouth daily., Disp: , Rfl:  .  aspirin EC 81 MG tablet, Take 81 mg by mouth every morning., Disp: , Rfl:  .  atorvastatin (LIPITOR) 10 MG tablet, , Disp: , Rfl:  .  cholecalciferol (VITAMIN D) 1000 UNITS tablet, Take 1,000 Units by mouth at bedtime., Disp: , Rfl:  .  citalopram (CELEXA) 20 MG tablet, Take 20 mg by mouth every morning., Disp:  , Rfl:  .  colchicine 0.6 MG tablet, Take 1 tablet (0.6 mg total) by mouth daily., Disp: 7 tablet, Rfl: 0 .  diphenoxylate-atropine (LOMOTIL) 2.5-0.025 MG tablet, diphenoxylate-atropine 2.5 mg-0.025 mg tablet, Disp: , Rfl:  .  DULoxetine (CYMBALTA) 60 MG capsule, duloxetine 60 mg capsule,delayed release, Disp: , Rfl:  .  fluconazole (DIFLUCAN) 150 MG tablet, fluconazole 150 mg tablet, Disp: , Rfl:  .  furosemide (LASIX) 40 MG tablet, Take 20 mg by mouth daily. , Disp: , Rfl:  .  gabapentin (NEURONTIN) 400 MG capsule, gabapentin 400 mg capsule  Take 1 capsule twice a day by oral route., Disp: , Rfl:  .  gentamicin cream (GARAMYCIN) 0.1 %, Apply 1 application topically 2 (two) times daily., Disp: 15 g, Rfl: 1 .  HYDROcodone-acetaminophen (NORCO/VICODIN) 5-325 MG tablet, hydrocodone 5 mg-acetaminophen 325 mg tablet  Take 1 tablet twice a day by oral route as needed., Disp: , Rfl:  .  hydrocortisone (ANUSOL-HC) 25 MG suppository, hydrocortisone acetate 25 mg rectal suppository, Disp: , Rfl:  .  INCRUSE ELLIPTA 62.5 MCG/INH AEPB, Inhale 1 puff into the lungs daily. , Disp: , Rfl:  .  ketoconazole (NIZORAL) 2 % cream, Apply to interspace 2 times a day for 3 weeks or until symptoms subside.,  Disp: 60 g, Rfl: 2 .  lisinopril (PRINIVIL,ZESTRIL) 20 MG tablet, Take 20 mg by mouth daily., Disp: , Rfl:  .  minocycline (MINOCIN) 100 MG capsule, Take 1 capsule (100 mg total) by mouth 2 (two) times daily., Disp: 20 capsule, Rfl: 0 .  nystatin cream (MYCOSTATIN), nystatin 100,000 unit/gram topical cream, Disp: , Rfl:  .  omeprazole (PRILOSEC) 20 MG capsule, Take 20 mg by mouth daily., Disp: , Rfl:  .  potassium chloride (MICRO-K) 10 MEQ CR capsule, Take 1 capsule by mouth 2 (two) times daily., Disp: , Rfl:  .  PROAIR HFA 108 (90 BASE) MCG/ACT inhaler, Inhale 2 puffs into the lungs every 6 (six) hours as needed for wheezing or shortness of breath. , Disp: , Rfl:  .  STIOLTO RESPIMAT 2.5-2.5 MCG/ACT AERS, Inhale 1  puff into the lungs in the morning and at bedtime., Disp: , Rfl:  .  zolpidem (AMBIEN) 10 MG tablet, Take 10 mg by mouth at bedtime as needed for sleep., Disp: , Rfl:   BP 122/63   Pulse 74   Resp 16   Ht 5\' 2"  (1.575 m)   Wt 189 lb 6.4 oz (85.9 kg)   BMI 34.64 kg/m   General well-developed well-nourished moderate obesity medium frame  Cardiovascular mild edema normal temperature  Skin previous incision over the right knee looks normal no erythema  Neurologic sensation normal mood affect normal oriented x3  Musculoskeletal gait slightly altered nothing significant  Right knee motion looks good no effusion tender medial lateral side of the tibia she can extend no palpable defects are noted mild swelling pes bursa  Internal x-ray: xrays stable implant   Meds ordered this encounter  Medications  . methocarbamol (ROBAXIN) 500 MG tablet    Sig: Take 1 tablet (500 mg total) by mouth 3 (three) times daily for 14 days.    Dispense:  42 tablet    Refill:  0   Fu prn

## 2020-01-12 NOTE — Patient Instructions (Signed)
I dont see any implant related issues so I think a heating pad and some robaxin should help

## 2020-01-13 ENCOUNTER — Encounter: Payer: Self-pay | Admitting: Internal Medicine

## 2020-01-13 ENCOUNTER — Ambulatory Visit (INDEPENDENT_AMBULATORY_CARE_PROVIDER_SITE_OTHER): Payer: Medicare HMO | Admitting: Internal Medicine

## 2020-01-13 VITALS — BP 128/72 | HR 97 | Temp 98.5°F | Resp 18 | Ht 63.0 in | Wt 189.9 lb

## 2020-01-13 DIAGNOSIS — E785 Hyperlipidemia, unspecified: Secondary | ICD-10-CM

## 2020-01-13 DIAGNOSIS — K219 Gastro-esophageal reflux disease without esophagitis: Secondary | ICD-10-CM | POA: Insufficient documentation

## 2020-01-13 DIAGNOSIS — J449 Chronic obstructive pulmonary disease, unspecified: Secondary | ICD-10-CM | POA: Insufficient documentation

## 2020-01-13 DIAGNOSIS — M109 Gout, unspecified: Secondary | ICD-10-CM | POA: Diagnosis not present

## 2020-01-13 DIAGNOSIS — Z0000 Encounter for general adult medical examination without abnormal findings: Secondary | ICD-10-CM | POA: Insufficient documentation

## 2020-01-13 DIAGNOSIS — Z7689 Persons encountering health services in other specified circumstances: Secondary | ICD-10-CM

## 2020-01-13 DIAGNOSIS — M545 Low back pain, unspecified: Secondary | ICD-10-CM

## 2020-01-13 DIAGNOSIS — I10 Essential (primary) hypertension: Secondary | ICD-10-CM | POA: Diagnosis not present

## 2020-01-13 DIAGNOSIS — L304 Erythema intertrigo: Secondary | ICD-10-CM

## 2020-01-13 DIAGNOSIS — Z23 Encounter for immunization: Secondary | ICD-10-CM | POA: Diagnosis not present

## 2020-01-13 DIAGNOSIS — F419 Anxiety disorder, unspecified: Secondary | ICD-10-CM | POA: Insufficient documentation

## 2020-01-13 DIAGNOSIS — G8929 Other chronic pain: Secondary | ICD-10-CM

## 2020-01-13 MED ORDER — CLOTRIMAZOLE-BETAMETHASONE 1-0.05 % EX CREA: 1.0000 "application " | TOPICAL_CREAM | Freq: Two times a day (BID) | CUTANEOUS | 0 refills | Status: DC

## 2020-01-13 NOTE — Assessment & Plan Note (Signed)
On Atorvastatin 10 mg QD Check lipid profile 

## 2020-01-13 NOTE — Assessment & Plan Note (Signed)
Stable On Stiolto and Albuterol (PRN)

## 2020-01-13 NOTE — Progress Notes (Signed)
New Patient Office Visit  Subjective:  Patient ID: Heather [name], female    DOB: [birth year]  Age: 79 y.o. MRN: [id number]  CC:  Chief Complaint  Patient presents with  . New Patient (Initial Visit)    new pt she has been itching alot under her breast and in her folds also hasnt had blood drawn since the new year also her fingers lock and draw up sometimes     HPI Heather [name] is 79 year old female with past medical history of hypertension, hyperlipidemia, GERD, COPD, gout, and chronic low back pain presents for establishing care.  She states that she has been in good health overall.  She denies any active complaints except itching underneath the breasts.  She has been using clotrimazole and triamcinolone cream for it.  She requests refills for it.  She denies any fever, chills, any discharge from the breasts, local redness or warmth.  She takes amlodipine, lisinopril and Lasix for her hypertension.  Her blood pressure is well controlled.  She denies any headache, dizziness, chest pain, dyspnea or palpitations.  She has a history of COPD, for which she takes Stiolto inhaler and uses ProAir as needed for dyspnea or wheezing.  She quit smoking more than 30 years.  She has a history of chronic low back pain, for which he visits orthopedic surgeon and neurologist for chronic pain management.  Patient has a history of anxiety and insomnia.  She takes Celexa and Ambien (PRN).  Patient is up-to-date with Covid and flu vaccine.  She has received pneumonia and Shingrix vaccine as well.  She received Tdap vaccine in the office today.    Past Medical History:  Diagnosis Date  . Anemia   . Anxiety   . Arthritis   . COPD (chronic obstructive pulmonary disease) ([place])   . Depression   . Dry eyes 04/14/2014  . GERD (gastroesophageal reflux disease)   . Glaucoma   . Gout   . Gout 10/23/2018  . HTN (hypertension)     Past Surgical History:  Procedure Laterality Date  . ABDOMINAL  HYSTERECTOMY    . BACK SURGERY     lumbar-disc  . BUNIONECTOMY Bilateral   . FOOT SURGERY Left    repair of "kissing Cousins"  . JOINT REPLACEMENT Bilateral    hip   . JOINT REPLACEMENT Right 2010 or 2012   knee  . KNEE SURGERY    . SHOULDER ARTHROSCOPY Right    shoulder surgery in [place] about 2000  . SHOULDER HEMI-ARTHROPLASTY Left 03/25/2014   Procedure: LEFT SHOULDER HEMI-ARTHROPLASTY;  Surgeon: Carole Civil, MD;  Location: [facility];  Service: Orthopedics;  Laterality: Left;    Family History  Problem Relation Age of Onset  . High blood pressure Mother   . Aneurysm Father   . Diabetes Brother   . Pancreatitis Brother     Social History   Socioeconomic History  . Marital status: Divorced    Spouse name: Not on file  . Number of children: Not on file  . Years of education: Not on file  . Highest education level: Not on file  Occupational History  . Not on file  Tobacco Use  . Smoking status: Former Smoker    Packs/day: 1.00    Years: 40.00    Pack years: 40.00    Types: Cigarettes    Quit date: 03/20/1985    Years since quitting: 34.8  . Smokeless tobacco: Never Used  Substance and Sexual Activity  .  Alcohol use: No  . Drug use: No  . Sexual activity: Yes    Birth control/protection: Surgical  Other Topics Concern  . Not on file  Social History Narrative   [name] is a 79 year old divorced female who lives alone. She has support of her son and sister.    Social Determinants of Health   Financial Resource Strain:   . Difficulty of Paying Living Expenses: Not on file  Food Insecurity:   . Worried About Charity fundraiser in the Last Year: Not on file  . Ran Out of Food in the Last Year: Not on file  Transportation Needs:   . Lack of Transportation (Medical): Not on file  . Lack of Transportation (Non-Medical): Not on file  Physical Activity:   . Days of Exercise per Week: Not on file  . Minutes of Exercise per Session: Not on file  Stress:   .  Feeling of Stress : Not on file  Social Connections:   . Frequency of Communication with Friends and Family: Not on file  . Frequency of Social Gatherings with Friends and Family: Not on file  . Attends Religious Services: Not on file  . Active Member of Clubs or Organizations: Not on file  . Attends Archivist Meetings: Not on file  . Marital Status: Not on file  Intimate Partner Violence:   . Fear of Current or Ex-Partner: Not on file  . Emotionally Abused: Not on file  . Physically Abused: Not on file  . Sexually Abused: Not on file    ROS Review of Systems  Constitutional: Negative for chills and fever.  HENT: Negative for congestion, sinus pressure, sinus pain and sore throat.   Eyes: Negative for pain and discharge.  Respiratory: Negative for cough and shortness of breath.   Cardiovascular: Negative for chest pain and palpitations.  Gastrointestinal: Negative for abdominal pain, constipation, diarrhea, nausea and vomiting.  Endocrine: Negative for polydipsia and polyuria.  Genitourinary: Negative for dysuria and hematuria.  Musculoskeletal: Positive for arthralgias and back pain. Negative for neck pain and neck stiffness.  Skin: Negative for rash.  Neurological: Negative for dizziness, syncope, weakness and numbness.  Psychiatric/Behavioral: Negative for agitation and behavioral problems.    Objective:   Today's Vitals: BP 128/72 (BP Location: Right Arm, Patient Position: Sitting, Cuff Size: Normal)   Pulse 97   Temp 98.5 F (36.9 C) (Oral)   Resp 18   Ht _0  (1.6 m)   Wt 189 lb 14.4 oz (86.1 kg)   SpO2 97%   BMI 33.64 kg/m   Physical Exam Vitals reviewed.  Constitutional:      General: She is not in acute distress.    Appearance: She is not diaphoretic.  HENT:     Head: Normocephalic and atraumatic.     Nose: Nose normal.     Mouth/Throat:     Mouth: Mucous membranes are moist.  Eyes:     General: No scleral icterus.    Extraocular Movements:  Extraocular movements intact.     Pupils: Pupils are equal, round, and reactive to light.  Cardiovascular:     Rate and Rhythm: Normal rate and regular rhythm.     Pulses: Normal pulses.     Heart sounds: Normal heart sounds. No murmur heard.   Pulmonary:     Breath sounds: Normal breath sounds. No wheezing or rales.  Abdominal:     Palpations: Abdomen is soft.     Tenderness: There  is no abdominal tenderness.  Musculoskeletal:     Cervical back: Neck supple. No tenderness.     Right lower leg: No edema.     Left lower leg: No edema.  Skin:    General: Skin is warm.     Findings: No rash.  Neurological:     General: No focal deficit present.     Mental Status: She is alert and oriented to person, place, and time.     Sensory: No sensory deficit.     Motor: No weakness.  Psychiatric:        Mood and Affect: Mood normal.        Behavior: Behavior normal.     Assessment & Plan:   Problem List Items Addressed This Visit      Encounter to establish care - Primary   Care established Previous chart reviewed History and medications reviewed with the patient     Relevant Orders  CBC with Differential  CMP14+EGFR  HgB A1c  TSH  Vitamin D (25 hydroxy)  Lipid Profile    Cardiovascular and Mediastinum   Essential hypertension    BP Readings from Last 1 Encounters:  01/13/20 128/72   Well-controlled/uncontrolled with Amlodipine, Lisinopril and Lasix Counseled for compliance with the medications Advised DASH diet and moderate exercise/walking, at least 150 mins/week        Respiratory   Chronic obstructive pulmonary disease (HCC)    Stable On Stiolto and Albuterol (PRN)        Digestive   Gastroesophageal reflux disease    On Omeprazole        Musculoskeletal and Integument   Intertrigo    Clotrimazole-Betamethasone cream underneath the breasts      Relevant Medications   clotrimazole-betamethasone (LOTRISONE) cream     Other   Gout    On  Allopurinol No tophi      Chronic low back pain    On Gabapentin and Cymbalta for chronic pain Follows up with Orthopedic surgeon and Neurologist         Hyperlipidemia    On Atorvastatin 10 mg QD Check lipid profile      Anxiety    On Celexa       Other Visit Diagnoses    Need for Tdap vaccination       Relevant Orders   Tdap vaccine greater than or equal to 7yo IM (Completed)      Outpatient Encounter Medications as of 01/13/2020  Medication Sig  . allopurinol (ZYLOPRIM) 300 MG tablet   . amLODipine (NORVASC) 5 MG tablet Take 1 tablet by mouth daily.  [name] atorvastatin (LIPITOR) 10 MG tablet   . citalopram (CELEXA) 20 MG tablet Take 20 mg by mouth every morning.  . furosemide (LASIX) 40 MG tablet Take 20 mg by mouth daily.   [name] gabapentin (NEURONTIN) 400 MG capsule gabapentin 400 mg capsule  Take 1 capsule twice a day by oral route.  [name] HYDROcodone-acetaminophen (NORCO/VICODIN) 5-325 MG tablet hydrocodone 5 mg-acetaminophen 325 mg tablet  Take 1 tablet twice a day by oral route as needed.  . INCRUSE ELLIPTA 62.5 MCG/INH AEPB Inhale 1 puff into the lungs daily.   [name] lisinopril (PRINIVIL,ZESTRIL) 20 MG tablet Take 20 mg by mouth daily.  [name] omeprazole (PRILOSEC) 20 MG capsule Take 20 mg by mouth daily.  . potassium chloride (MICRO-K) 10 MEQ CR capsule Take 1 capsule by mouth 2 (two) times daily.  [name] PROAIR HFA 108 (90 BASE) MCG/ACT inhaler Inhale 2 puffs into  the lungs every 6 (six) hours as needed for wheezing or shortness of breath.   . STIOLTO RESPIMAT 2.5-2.5 MCG/ACT AERS Inhale 1 puff into the lungs in the morning and at bedtime.  [name] zolpidem (AMBIEN) 10 MG tablet Take 10 mg by mouth at bedtime as needed for sleep.  . [DISCONTINUED] aspirin EC 81 MG tablet Take 81 mg by mouth every morning.  . clotrimazole-betamethasone (LOTRISONE) cream Apply 1 application topically 2 (two) times daily.  . [DISCONTINUED] cholecalciferol (VITAMIN D) 1000 UNITS tablet Take 1,000 Units by mouth  at bedtime. (Patient not taking: Reported on 01/13/2020)  . [DISCONTINUED] colchicine 0.6 MG tablet Take 1 tablet (0.6 mg total) by mouth daily. (Patient not taking: Reported on 01/13/2020)  . [DISCONTINUED] diphenoxylate-atropine (LOMOTIL) 2.5-0.025 MG tablet diphenoxylate-atropine 2.5 mg-0.025 mg tablet (Patient not taking: Reported on 01/13/2020)  . [DISCONTINUED] DULoxetine (CYMBALTA) 60 MG capsule duloxetine 60 mg capsule,delayed release (Patient not taking: Reported on 01/13/2020)  . [DISCONTINUED] fluconazole (DIFLUCAN) 150 MG tablet fluconazole 150 mg tablet (Patient not taking: Reported on 01/13/2020)  . [DISCONTINUED] gentamicin cream (GARAMYCIN) 0.1 % Apply 1 application topically 2 (two) times daily. (Patient not taking: Reported on 01/13/2020)  . [DISCONTINUED] hydrocortisone (ANUSOL-HC) 25 MG suppository hydrocortisone acetate 25 mg rectal suppository (Patient not taking: Reported on 01/13/2020)  . [DISCONTINUED] ketoconazole (NIZORAL) 2 % cream Apply to interspace 2 times a day for 3 weeks or until symptoms subside. (Patient not taking: Reported on 01/13/2020)  . [DISCONTINUED] methocarbamol (ROBAXIN) 500 MG tablet Take 1 tablet (500 mg total) by mouth 3 (three) times daily for 14 days. (Patient not taking: Reported on 01/13/2020)  . [DISCONTINUED] minocycline (MINOCIN) 100 MG capsule Take 1 capsule (100 mg total) by mouth 2 (two) times daily. (Patient not taking: Reported on 01/13/2020)  . [DISCONTINUED] nystatin cream (MYCOSTATIN) nystatin 100,000 unit/gram topical cream (Patient not taking: Reported on 01/13/2020)   No facility-administered encounter medications on file as of 01/13/2020.    Follow-up: Return in about 4 months (around 05/12/2020).   Lindell Spar, MD

## 2020-01-13 NOTE — Assessment & Plan Note (Signed)
On Omeprazole 

## 2020-01-13 NOTE — Patient Instructions (Signed)
Please continue to take medications as prescribed.  Please apply clotrimazole cream underneath the breasts as prescribed.  Please continue to follow-up with your neurologist.  Please stop taking aspirin as discussed.

## 2020-01-13 NOTE — Assessment & Plan Note (Signed)
Care established Previous chart reviewed History and medications reviewed with the patient 

## 2020-01-13 NOTE — Assessment & Plan Note (Signed)
On Celexa 

## 2020-01-13 NOTE — Assessment & Plan Note (Addendum)
On Allopurinol No tophi

## 2020-01-13 NOTE — Assessment & Plan Note (Signed)
Clotrimazole-Betamethasone cream underneath the breasts

## 2020-01-13 NOTE — Assessment & Plan Note (Signed)
BP Readings from Last 1 Encounters:  01/13/20 128/72   Well-controlled/uncontrolled with Amlodipine, Lisinopril and Lasix Counseled for compliance with the medications Advised DASH diet and moderate exercise/walking, at least 150 mins/week

## 2020-01-13 NOTE — Assessment & Plan Note (Signed)
On Gabapentin and Cymbalta for chronic pain Follows up with Orthopedic surgeon and Neurologist

## 2020-01-16 ENCOUNTER — Telehealth (INDEPENDENT_AMBULATORY_CARE_PROVIDER_SITE_OTHER): Payer: Medicare HMO | Admitting: Internal Medicine

## 2020-01-16 ENCOUNTER — Other Ambulatory Visit: Payer: Self-pay

## 2020-01-16 ENCOUNTER — Encounter: Payer: Self-pay | Admitting: Internal Medicine

## 2020-01-16 DIAGNOSIS — R7303 Prediabetes: Secondary | ICD-10-CM

## 2020-01-16 DIAGNOSIS — I10 Essential (primary) hypertension: Secondary | ICD-10-CM | POA: Diagnosis not present

## 2020-01-16 DIAGNOSIS — N179 Acute kidney failure, unspecified: Secondary | ICD-10-CM | POA: Diagnosis not present

## 2020-01-16 LAB — CBC WITH DIFFERENTIAL/PLATELET
Basophils Absolute: 0.1 10*3/uL (ref 0.0–0.2)
Basos: 2 %
EOS (ABSOLUTE): 0.5 10*3/uL — ABNORMAL HIGH (ref 0.0–0.4)
Eos: 9 %
Hematocrit: 37.5 % (ref 34.0–46.6)
Hemoglobin: 12.3 g/dL (ref 11.1–15.9)
Immature Grans (Abs): 0 10*3/uL (ref 0.0–0.1)
Immature Granulocytes: 0 %
Lymphocytes Absolute: 1.5 10*3/uL (ref 0.7–3.1)
Lymphs: 28 %
MCH: 31.6 pg (ref 26.6–33.0)
MCHC: 32.8 g/dL (ref 31.5–35.7)
MCV: 96 fL (ref 79–97)
Monocytes Absolute: 0.6 10*3/uL (ref 0.1–0.9)
Monocytes: 11 %
Neutrophils Absolute: 2.8 10*3/uL (ref 1.4–7.0)
Neutrophils: 50 %
Platelets: 195 10*3/uL (ref 150–450)
RBC: 3.89 x10E6/uL (ref 3.77–5.28)
RDW: 13.7 % (ref 11.7–15.4)
WBC: 5.5 10*3/uL (ref 3.4–10.8)

## 2020-01-16 LAB — VITAMIN D 25 HYDROXY (VIT D DEFICIENCY, FRACTURES): Vit D, 25-Hydroxy: 28.7 ng/mL — ABNORMAL LOW (ref 30.0–100.0)

## 2020-01-16 LAB — CMP14+EGFR
ALT: 19 IU/L (ref 0–32)
AST: 27 IU/L (ref 0–40)
Albumin/Globulin Ratio: 2.1 (ref 1.2–2.2)
Albumin: 4.6 g/dL (ref 3.7–4.7)
Alkaline Phosphatase: 116 IU/L (ref 44–121)
BUN/Creatinine Ratio: 24 (ref 12–28)
BUN: 40 mg/dL — ABNORMAL HIGH (ref 8–27)
Bilirubin Total: 0.3 mg/dL (ref 0.0–1.2)
CO2: 20 mmol/L (ref 20–29)
Calcium: 9.8 mg/dL (ref 8.7–10.3)
Chloride: 104 mmol/L (ref 96–106)
Creatinine, Ser: 1.65 mg/dL — ABNORMAL HIGH (ref 0.57–1.00)
GFR calc Af Amer: 34 mL/min/{1.73_m2} — ABNORMAL LOW (ref >59)
GFR calc non Af Amer: 30 mL/min/{1.73_m2} — ABNORMAL LOW (ref >59)
Globulin, Total: 2.2 g/dL (ref 1.5–4.5)
Glucose: 93 mg/dL (ref 65–99)
Potassium: 4.1 mmol/L (ref 3.5–5.2)
Sodium: 143 mmol/L (ref 134–144)
Total Protein: 6.8 g/dL (ref 6.0–8.5)

## 2020-01-16 LAB — LIPID PANEL
Chol/HDL Ratio: 2.1 ratio (ref 0.0–4.4)
Cholesterol, Total: 193 mg/dL (ref 100–199)
HDL: 91 mg/dL (ref >39)
LDL Chol Calc (NIH): 85 mg/dL (ref 0–99)
Triglycerides: 96 mg/dL (ref 0–149)
VLDL Cholesterol Cal: 17 mg/dL (ref 5–40)

## 2020-01-16 LAB — HEMOGLOBIN A1C
Est. average glucose Bld gHb Est-mCnc: 123 mg/dL
Hgb A1c MFr Bld: 5.9 % — ABNORMAL HIGH (ref 4.8–5.6)

## 2020-01-16 LAB — TSH: TSH: 1.2 u[IU]/mL (ref 0.450–4.500)

## 2020-01-16 NOTE — Assessment & Plan Note (Signed)
Stable, on amlodipine and lisinopril Discontinue Lasix due to AKI

## 2020-01-16 NOTE — Progress Notes (Addendum)
Virtual Visit via Telephone Note   This visit type was conducted due to national recommendations for restrictions regarding the COVID-19 Pandemic (e.g. social distancing) in an effort to limit this patient's exposure and mitigate transmission in our community.  Due to her co-morbid illnesses, this patient is at least at moderate risk for complications without adequate follow up.  This format is felt to be most appropriate for this patient at this time.  The patient did not have access to video technology/had technical difficulties with video requiring transitioning to audio format only (telephone).  All issues noted in this document were discussed and addressed.  No physical exam could be performed with this format.  Evaluation Performed:  Follow-up visit  Date:  01/16/2020   ID:  Heather [name], DOB 1941/02/06, MRN [id number]  Patient Location: Home Provider Location: Home Office  Location of Patient: Home Location of Provider: Telehealth Consent was obtain for visit to be over via telehealth. I verified that I am speaking with the correct person using two identifiers.  PCP:  Anabel Halon, MD   Chief Complaint:  Blood test review  History of Present Illness:    Heather [name] is a 79 y.o. female with 79 year old female with past medical history of hypertension, hyperlipidemia, GERD, COPD, gout, and chronic low back pain has A follow-up televisit to discuss blood tests.  Blood test were discussed in detail with the patient.  AKI was noticed in the blood tests.  She denies any dysuria, hematuria, urinary frequency or hesitancy.  Of note, patient takes Lasix for LE edema.  She is advised to stop taking Lasix and increase water intake in her diet.  Advised to follow low carbohydrate diet for her prediabetes.  She denies any polyuria, polyphagia, constipation, diarrhea, or excessive sweating.  The patient does not have symptoms concerning for COVID-19 infection (fever, chills,  cough, or new shortness of breath).   Past Medical, Surgical, Social History, Allergies, and Medications have been Reviewed.  Past Medical History:  Diagnosis Date  . Anemia   . Anxiety   . Arthritis   . COPD (chronic obstructive pulmonary disease) ([facility])   . Depression   . Dry eyes 04/14/2014  . GERD (gastroesophageal reflux disease)   . Glaucoma   . Gout   . Gout 10/23/2018  . HTN (hypertension)    Past Surgical History:  Procedure Laterality Date  . ABDOMINAL HYSTERECTOMY    . BACK SURGERY     lumbar-disc  . BUNIONECTOMY Bilateral   . FOOT SURGERY Left    repair of "kissing Cousins"  . JOINT REPLACEMENT Bilateral    hip   . JOINT REPLACEMENT Right 2010 or 2012   knee  . KNEE SURGERY    . SHOULDER ARTHROSCOPY Right    shoulder surgery in [place] about 2000  . SHOULDER HEMI-ARTHROPLASTY Left 03/25/2014   Procedure: LEFT SHOULDER HEMI-ARTHROPLASTY;  Surgeon: Vickki Hearing, MD;  Location: [facility];  Service: Orthopedics;  Laterality: Left;     Current Meds  Medication Sig  . allopurinol (ZYLOPRIM) 300 MG tablet   . amLODipine (NORVASC) 5 MG tablet Take 1 tablet by mouth daily.  [name] atorvastatin (LIPITOR) 10 MG tablet   . citalopram (CELEXA) 20 MG tablet Take 20 mg by mouth every morning.  . clotrimazole-betamethasone (LOTRISONE) cream Apply 1 application topically 2 (two) times daily.  [name] gabapentin (NEURONTIN) 400 MG capsule gabapentin 400 mg capsule  Take 1 capsule twice a day by oral route.  [name]  HYDROcodone-acetaminophen (NORCO/VICODIN) 5-325 MG tablet hydrocodone 5 mg-acetaminophen 325 mg tablet  Take 1 tablet twice a day by oral route as needed.  . INCRUSE ELLIPTA 62.5 MCG/INH AEPB Inhale 1 puff into the lungs daily.   [name] lisinopril (PRINIVIL,ZESTRIL) 20 MG tablet Take 20 mg by mouth daily.  [name] omeprazole (PRILOSEC) 20 MG capsule Take 20 mg by mouth daily.  . potassium chloride (MICRO-K) 10 MEQ CR capsule Take 10 mEq by mouth daily.   [name] PROAIR HFA 108 (90 BASE) MCG/ACT  inhaler Inhale 2 puffs into the lungs every 6 (six) hours as needed for wheezing or shortness of breath.   . STIOLTO RESPIMAT 2.5-2.5 MCG/ACT AERS Inhale 1 puff into the lungs in the morning and at bedtime.  [name] zolpidem (AMBIEN) 10 MG tablet Take 10 mg by mouth at bedtime as needed for sleep.  . [DISCONTINUED] furosemide (LASIX) 40 MG tablet Take 20 mg by mouth daily.      Allergies:   Lovastatin and Lisinopril   ROS:   ROS Review of Systems  Constitutional: Negative for chills and fever.  HENT: Negative for congestion, sinus pressure, sinus pain and sore throat.   Eyes: Negative for pain and discharge.  Respiratory: Negative for cough and shortness of breath.   Cardiovascular: Negative for chest pain and palpitations.  Gastrointestinal: Negative for abdominal pain, constipation, diarrhea, nausea and vomiting.  Endocrine: Negative for polydipsia and polyuria.  Genitourinary: Negative for dysuria and hematuria.  Musculoskeletal: Positive for arthralgias and back pain. Negative for neck pain and neck stiffness.  Skin: Negative for rash.  Neurological: Negative for dizziness, syncope, weakness and numbness.  Psychiatric/Behavioral: Negative for agitation and behavioral problems.   Labs/Other Tests and Data Reviewed:    Recent Labs: 01/13/2020: ALT 19; BUN 40; Creatinine, Ser 1.65; Hemoglobin 12.3; Platelets 195; Potassium 4.1; Sodium 143; TSH 1.200   Recent Lipid Panel Lab Results  Component Value Date/Time   CHOL 193 01/13/2020 02:58 PM   TRIG 96 01/13/2020 02:58 PM   HDL 91 01/13/2020 02:58 PM   CHOLHDL 2.1 01/13/2020 02:58 PM   LDLCALC 85 01/13/2020 02:58 PM    Wt Readings from Last 3 Encounters:  01/16/20 188 lb 3.2 oz (85.4 kg)  01/13/20 189 lb 14.4 oz (86.1 kg)  01/12/20 189 lb 6.4 oz (85.9 kg)      ASSESSMENT & PLAN:    AKI (acute kidney injury) (HCC) Likely prerenal, BUN/Cr. ratio > 2 Related to Lasix and poor PO intake Discontinue Lasix and advised to increase  fluid intake Will recheck CMP later Avoid nephrotoxic agents, including NSAIDs Decreased potassium chloride 10 mEq once daily   Prediabetes Lab Results  Component Value Date   HGBA1C 5.9 (H) 01/13/2020   CMP reviewed Advised to follow low carbohydrate diet  Essential hypertension Stable, on amlodipine and lisinopril Discontinue Lasix due to AKI  Medications were reviewed with the patient in detail.  Time:   Today, I have spent 15 minutes with the patient with telehealth technology discussing the above problems.     Medication Adjustments/Labs and Tests Ordered: Current medicines are reviewed at length with the patient today.  Concerns regarding medicines are outlined above.   Tests Ordered: No orders of the defined types were placed in this encounter.   Medication Changes: No orders of the defined types were placed in this encounter.    Note: This dictation was prepared with Dragon dictation along with smaller phrase technology. Similar sounding words can be transcribed inadequately or may not be  corrected upon review. Any transcriptional errors that result from this process are unintentional.      Disposition:  Follow up  Signed, Anabel Halon, MD  01/16/2020 9:43 AM     [facility]

## 2020-01-16 NOTE — Patient Instructions (Addendum)
Please stop taking Lasix (Furosemide) and take potassium chloride only once daily.  Please increase water intake in your diet.  You should take at least 2 L of fluids in a day.  Please avoid taking ibuprofen for knee pain.  Okay to take Tylenol.  Please start taking Vitamin D 1000 IU once daily.  Please follow low carbohydrate diet for better control of blood glucose.  Please continue taking other medications as advised.

## 2020-01-16 NOTE — Assessment & Plan Note (Signed)
Lab Results  Component Value Date   HGBA1C 5.9 (H) 01/13/2020   CMP reviewed Advised to follow low carbohydrate diet

## 2020-01-16 NOTE — Assessment & Plan Note (Signed)
Likely prerenal, BUN/Cr. ratio > 2 Related to Lasix and poor PO intake Discontinue Lasix and advised to increase fluid intake Will recheck CMP later Avoid nephrotoxic agents, including NSAIDs Decreased potassium chloride 10 mEq once daily

## 2020-01-26 ENCOUNTER — Other Ambulatory Visit: Payer: Self-pay

## 2020-01-26 ENCOUNTER — Telehealth: Payer: Self-pay | Admitting: Internal Medicine

## 2020-01-26 MED ORDER — AMLODIPINE BESYLATE 5 MG PO TABS
5.0000 mg | ORAL_TABLET | Freq: Every day | ORAL | 0 refills | Status: DC
Start: 2020-01-26 — End: 2020-02-03

## 2020-01-26 MED ORDER — ZOLPIDEM TARTRATE 10 MG PO TABS
10.0000 mg | ORAL_TABLET | Freq: Every evening | ORAL | 0 refills | Status: DC | PRN
Start: 2020-01-26 — End: 2020-11-01

## 2020-01-26 MED ORDER — ATORVASTATIN CALCIUM 10 MG PO TABS
10.0000 mg | ORAL_TABLET | Freq: Every day | ORAL | 2 refills | Status: DC
Start: 2020-01-26 — End: 2021-11-03

## 2020-01-26 MED ORDER — ALLOPURINOL 300 MG PO TABS
300.0000 mg | ORAL_TABLET | Freq: Every day | ORAL | 0 refills | Status: DC
Start: 2020-01-26 — End: 2020-02-25

## 2020-01-26 MED ORDER — POTASSIUM CHLORIDE ER 10 MEQ PO CPCR
ORAL_CAPSULE | ORAL | 1 refills | Status: DC
Start: 2020-01-26 — End: 2020-02-03

## 2020-01-26 NOTE — Telephone Encounter (Signed)
Prescriptions refilled.

## 2020-01-26 NOTE — Telephone Encounter (Signed)
Needs refill on the following medicines and sent in to [facility]  Patient # [id number]  Potassium Chloride 10 mg Atorvastatin 10 mg Allopurinol 300 mg Losartan 100 mg Amlodipine 5 mg Zolpidem 10 mg

## 2020-02-03 ENCOUNTER — Encounter: Payer: Self-pay | Admitting: Internal Medicine

## 2020-02-03 ENCOUNTER — Other Ambulatory Visit: Payer: Self-pay

## 2020-02-03 ENCOUNTER — Ambulatory Visit (INDEPENDENT_AMBULATORY_CARE_PROVIDER_SITE_OTHER): Payer: Medicare HMO | Admitting: Internal Medicine

## 2020-02-03 VITALS — BP 160/73 | HR 86 | Temp 98.6°F | Resp 18 | Ht 62.5 in | Wt 195.8 lb

## 2020-02-03 DIAGNOSIS — I10 Essential (primary) hypertension: Secondary | ICD-10-CM | POA: Diagnosis not present

## 2020-02-03 DIAGNOSIS — R600 Localized edema: Secondary | ICD-10-CM

## 2020-02-03 MED ORDER — LOSARTAN POTASSIUM-HCTZ 100-25 MG PO TABS: 1.0000 | ORAL_TABLET | Freq: Every day | ORAL | 3 refills | Status: DC

## 2020-02-03 NOTE — Patient Instructions (Signed)
Please stop taking Amlodipine, Lisinopril and Potassium tablets.  Please start taking Losartan-Hydrochlorothiazide as prescribed.  Please continue to use compression stockings.  Please try to keep legs elevated when possible.

## 2020-02-03 NOTE — Assessment & Plan Note (Signed)
Likely lymphedema and/or due to vascular insufficiency Echo from past reviewed, preserved EF. Keep legs elevated Compression stockings Started HCTZ, was on Lasix in the past - was discontinued due to AKI

## 2020-02-03 NOTE — Progress Notes (Signed)
Established Patient Office Visit  Subjective:  Patient ID: Heather [name], female    DOB: [birth year]  Age: 79 y.o. MRN: [id number]  CC:  Chief Complaint  Patient presents with   Foot Swelling    legs and feet swelling for awhile was taken off of furosemide then the swelling got worse     HPI Heather [name] is 79 year old female with past medical history of hypertension, hyperlipidemia, GERD, COPD, gout, and chronic low back pain who presents for evaluation of b/l LE swelling.  Patient complains of bilateral LE swelling which has been getting worse since she discontinued Lasix.  Of note, Lasix was discontinued due to AKI noticed on CMP.  She has been using compression stockings as advised.  She states that she still needs to improve her habits to keep the legs elevated. Of note, she is on Amlodipine for her hypertension. She denies any numbness, weakness or tingling in the feet.  Her BP is noted to be 160/73 in the office today. She denies any headache, dizziness, chest pain, dyspnea or palpitations.  She takes amlodipine and lisinopril regularly.  Past Medical History:  Diagnosis Date   Anemia    Anxiety    Arthritis    COPD (chronic obstructive pulmonary disease) (HCC)    Depression    Dry eyes 04/14/2014   GERD (gastroesophageal reflux disease)    Glaucoma    Gout    Gout 10/23/2018   HTN (hypertension)     Past Surgical History:  Procedure Laterality Date   ABDOMINAL HYSTERECTOMY     BACK SURGERY     lumbar-disc   BUNIONECTOMY Bilateral    FOOT SURGERY Left    repair of "kissing Cousins"   JOINT REPLACEMENT Bilateral    hip    JOINT REPLACEMENT Right 2010 or 2012   knee   KNEE SURGERY     SHOULDER ARTHROSCOPY Right    shoulder surgery in [place] about 2000   SHOULDER HEMI-ARTHROPLASTY Left 03/25/2014   Procedure: LEFT SHOULDER HEMI-ARTHROPLASTY;  Surgeon: Vickki Hearing, MD;  Location: [facility];  Service: Orthopedics;  Laterality: Left;     Family History  Problem Relation Age of Onset   High blood pressure Mother    Aneurysm Father    Diabetes Brother    Pancreatitis Brother     Social History   Socioeconomic History   Marital status: Divorced    Spouse name: Not on file   Number of children: Not on file   Years of education: Not on file   Highest education level: Not on file  Occupational History   Not on file  Tobacco Use   Smoking status: Former Smoker    Packs/day: 1.00    Years: 40.00    Pack years: 40.00    Types: Cigarettes    Quit date: 03/20/1985    Years since quitting: 34.8   Smokeless tobacco: Never Used  Substance and Sexual Activity   Alcohol use: No   Drug use: No   Sexual activity: Yes    Birth control/protection: Surgical  Other Topics Concern   Not on file  Social History Narrative   Heather [name] is a 79 year old divorced female who lives alone. She has support of her son and sister.    Social Determinants of Health   Financial Resource Strain:    Difficulty of Paying Living Expenses: Not on file  Food Insecurity:    Worried About Running Out of Food in the Last  Year: Not on file   Ran Out of Food in the Last Year: Not on file  Transportation Needs:    Lack of Transportation (Medical): Not on file   Lack of Transportation (Non-Medical): Not on file  Physical Activity:    Days of Exercise per Week: Not on file   Minutes of Exercise per Session: Not on file  Stress:    Feeling of Stress : Not on file  Social Connections:    Frequency of Communication with Friends and Family: Not on file   Frequency of Social Gatherings with Friends and Family: Not on file   Attends Religious Services: Not on file   Active Member of Clubs or Organizations: Not on file   Attends Banker Meetings: Not on file   Marital Status: Not on file  Intimate Partner Violence:    Fear of Current or Ex-Partner: Not on file   Emotionally Abused: Not on file    Physically Abused: Not on file   Sexually Abused: Not on file    Outpatient Medications Prior to Visit  Medication Sig Dispense Refill   allopurinol (ZYLOPRIM) 300 MG tablet Take 1 tablet (300 mg total) by mouth daily. 30 tablet 0   atorvastatin (LIPITOR) 10 MG tablet Take 1 tablet (10 mg total) by mouth daily. 30 tablet 2   citalopram (CELEXA) 20 MG tablet Take 20 mg by mouth every morning.     clotrimazole-betamethasone (LOTRISONE) cream Apply 1 application topically 2 (two) times daily. 30 g 0   gabapentin (NEURONTIN) 400 MG capsule gabapentin 400 mg capsule  Take 1 capsule twice a day by oral route.     HYDROcodone-acetaminophen (NORCO/VICODIN) 5-325 MG tablet hydrocodone 5 mg-acetaminophen 325 mg tablet  Take 1 tablet twice a day by oral route as needed.     INCRUSE ELLIPTA 62.5 MCG/INH AEPB Inhale 1 puff into the lungs daily.      omeprazole (PRILOSEC) 20 MG capsule Take 20 mg by mouth daily.     PROAIR HFA 108 (90 BASE) MCG/ACT inhaler Inhale 2 puffs into the lungs every 6 (six) hours as needed for wheezing or shortness of breath.      STIOLTO RESPIMAT 2.5-2.5 MCG/ACT AERS Inhale 1 puff into the lungs in the morning and at bedtime.     zolpidem (AMBIEN) 10 MG tablet Take 1 tablet (10 mg total) by mouth at bedtime as needed for sleep. 30 tablet 0   amLODipine (NORVASC) 5 MG tablet Take 1 tablet (5 mg total) by mouth daily. 30 tablet 0   lisinopril (PRINIVIL,ZESTRIL) 20 MG tablet Take 20 mg by mouth daily.     potassium chloride (MICRO-K) 10 MEQ CR capsule Heather [name] 30 capsule 1   No facility-administered medications prior to visit.    Allergies  Allergen Reactions   Lovastatin Swelling    Patient states that her tongue swells and she gets a tingling feeling all over. Patient states that her tongue swells and she gets a tingling feeling all over.   Lisinopril     ROS Review of Systems  Constitutional: Negative for chills and fever.  HENT: Negative for  congestion, sinus pressure, sinus pain and sore throat.   Eyes: Negative for pain and discharge.  Respiratory: Negative for cough and shortness of breath.   Cardiovascular: Positive for leg swelling. Negative for chest pain and palpitations.  Gastrointestinal: Negative for abdominal pain, constipation, diarrhea, nausea and vomiting.  Endocrine: Negative for polydipsia and polyuria.  Genitourinary: Negative for dysuria and hematuria.  Musculoskeletal: Positive for arthralgias and back pain. Negative for neck pain and neck stiffness.  Skin: Negative for rash.  Neurological: Negative for dizziness, syncope, weakness and numbness.  Psychiatric/Behavioral: Negative for agitation and behavioral problems.      Objective:    Physical Exam Vitals reviewed.  Constitutional:      General: She is not in acute distress.    Appearance: She is not diaphoretic.  HENT:     Head: Normocephalic and atraumatic.     Nose: Nose normal. No congestion.     Mouth/Throat:     Mouth: Mucous membranes are moist.     Pharynx: No posterior oropharyngeal erythema.  Eyes:     General: No scleral icterus.    Extraocular Movements: Extraocular movements intact.     Pupils: Pupils are equal, round, and reactive to light.  Cardiovascular:     Rate and Rhythm: Normal rate and regular rhythm.     Pulses: Normal pulses.     Heart sounds: Normal heart sounds. No murmur heard.   Pulmonary:     Breath sounds: Normal breath sounds. No wheezing or rales.  Abdominal:     Palpations: Abdomen is soft.     Tenderness: There is no abdominal tenderness.  Musculoskeletal:     Cervical back: Neck supple. No tenderness.     Right lower leg: Edema (2+) present.     Left lower leg: Edema (2+) present.  Skin:    General: Skin is warm.     Findings: No rash.  Neurological:     General: No focal deficit present.     Mental Status: She is alert and oriented to person, place, and time.     Sensory: No sensory deficit.      Motor: No weakness.  Psychiatric:        Mood and Affect: Mood normal.        Behavior: Behavior normal.     BP (!) 160/73 (BP Location: Right Arm, Patient Position: Sitting, Cuff Size: Normal)    Pulse 86    Temp 98.6 F (37 C) (Oral)    Resp 18    Ht 5' 2.5" (1.588 m)    Wt 195 lb 12.8 oz (88.8 kg)    SpO2 94%    BMI 35.24 kg/m  Wt Readings from Last 3 Encounters:  02/03/20 195 lb 12.8 oz (88.8 kg)  01/16/20 188 lb 3.2 oz (85.4 kg)  01/13/20 189 lb 14.4 oz (86.1 kg)     Health Maintenance Due  Topic Date Due   Hepatitis C Screening  Never done   PNA vac Low Risk Adult (1 of 2 - PCV13) Never done    There are no preventive care reminders to display for this patient.  Lab Results  Component Value Date   TSH 1.200 01/13/2020   Lab Results  Component Value Date   WBC 5.5 01/13/2020   HGB 12.3 01/13/2020   HCT 37.5 01/13/2020   MCV 96 01/13/2020   PLT 195 01/13/2020   Lab Results  Component Value Date   NA 143 01/13/2020   K 4.1 01/13/2020   CO2 20 01/13/2020   GLUCOSE 93 01/13/2020   BUN 40 (H) 01/13/2020   CREATININE 1.65 (H) 01/13/2020   BILITOT 0.3 01/13/2020   ALKPHOS 116 01/13/2020   AST 27 01/13/2020   ALT 19 01/13/2020   PROT 6.8 01/13/2020   ALBUMIN 4.6 01/13/2020   CALCIUM 9.8 01/13/2020   ANIONGAP 13 10/19/2018   Lab Results  Component Value Date   CHOL 193 01/13/2020   Lab Results  Component Value Date   HDL 91 01/13/2020   Lab Results  Component Value Date   LDLCALC 85 01/13/2020   Lab Results  Component Value Date   TRIG 96 01/13/2020   Lab Results  Component Value Date   CHOLHDL 2.1 01/13/2020   Lab Results  Component Value Date   HGBA1C 5.9 (H) 01/13/2020      Assessment & Plan:   Problem List Items Addressed This Visit      Cardiovascular and Mediastinum   Essential hypertension - Primary    BP Readings from Last 1 Encounters:  02/03/20 (!) 160/73   uncontrolled with Amlodipine and Lisinopril Discontinue  Amlodipine due to LE swelling Switched from Lisinopril to Losartan as h/o gout, started Losartan-HCTZ Counseled for compliance with the medications Advised DASH diet and moderate exercise/walking, at least 150 mins/week      Relevant Medications   losartan-hydrochlorothiazide (HYZAAR) 100-25 MG tablet     Other   Leg edema    Likely lymphedema and/or due to vascular insufficiency Echo from past reviewed, preserved EF. Keep legs elevated Compression stockings Started HCTZ, was on Lasix in the past - was discontinued due to AKI      Relevant Medications   losartan-hydrochlorothiazide (HYZAAR) 100-25 MG tablet   Other Relevant Orders   Ambulatory referral to Physical Therapy      Meds ordered this encounter  Medications   losartan-hydrochlorothiazide (HYZAAR) 100-25 MG tablet    Sig: Take 1 tablet by mouth daily.    Dispense:  30 tablet    Refill:  3    Follow-up: Return if symptoms worsen or fail to improve.    Anabel Halon, MD

## 2020-02-03 NOTE — Assessment & Plan Note (Signed)
BP Readings from Last 1 Encounters:  02/03/20 (!) 160/73   uncontrolled with Amlodipine and Lisinopril Discontinue Amlodipine due to LE swelling Switched from Lisinopril to Losartan as h/o gout, started Losartan-HCTZ Counseled for compliance with the medications Advised DASH diet and moderate exercise/walking, at least 150 mins/week

## 2020-02-24 ENCOUNTER — Other Ambulatory Visit: Payer: Self-pay | Admitting: Internal Medicine

## 2020-03-10 ENCOUNTER — Ambulatory Visit (HOSPITAL_COMMUNITY): Payer: Medicare HMO | Admitting: Physical Therapy

## 2020-03-12 ENCOUNTER — Ambulatory Visit (HOSPITAL_COMMUNITY): Payer: Medicare HMO | Admitting: Physical Therapy

## 2020-03-17 ENCOUNTER — Encounter (HOSPITAL_COMMUNITY): Payer: Medicare HMO | Admitting: Physical Therapy

## 2020-03-19 ENCOUNTER — Encounter (HOSPITAL_COMMUNITY): Payer: Medicare HMO | Admitting: Physical Therapy

## 2020-03-22 ENCOUNTER — Encounter (HOSPITAL_COMMUNITY): Payer: Medicare HMO | Admitting: Physical Therapy

## 2020-03-24 ENCOUNTER — Encounter (HOSPITAL_COMMUNITY): Payer: Medicare HMO | Admitting: Physical Therapy

## 2020-03-24 ENCOUNTER — Ambulatory Visit: Payer: Medicare Other | Admitting: Podiatry

## 2020-03-29 ENCOUNTER — Encounter (HOSPITAL_COMMUNITY): Payer: Medicare HMO | Admitting: Physical Therapy

## 2020-03-31 ENCOUNTER — Encounter (HOSPITAL_COMMUNITY): Payer: Medicare HMO | Admitting: Physical Therapy

## 2020-04-02 ENCOUNTER — Encounter (HOSPITAL_COMMUNITY): Payer: Medicare HMO | Admitting: Physical Therapy

## 2020-04-05 ENCOUNTER — Encounter (HOSPITAL_COMMUNITY): Payer: Medicare HMO | Admitting: Physical Therapy

## 2020-04-07 ENCOUNTER — Encounter (HOSPITAL_COMMUNITY): Payer: Medicare HMO | Admitting: Physical Therapy

## 2020-04-09 ENCOUNTER — Encounter (HOSPITAL_COMMUNITY): Payer: Medicare HMO | Admitting: Physical Therapy

## 2020-04-09 DIAGNOSIS — N184 Chronic kidney disease, stage 4 (severe): Secondary | ICD-10-CM | POA: Insufficient documentation

## 2020-04-09 DIAGNOSIS — N1831 Chronic kidney disease, stage 3a: Secondary | ICD-10-CM | POA: Insufficient documentation

## 2020-04-09 DIAGNOSIS — N1832 Chronic kidney disease, stage 3b: Secondary | ICD-10-CM | POA: Insufficient documentation

## 2020-04-12 ENCOUNTER — Encounter (HOSPITAL_COMMUNITY): Payer: Medicare HMO | Admitting: Physical Therapy

## 2020-04-14 ENCOUNTER — Other Ambulatory Visit: Payer: Self-pay

## 2020-04-14 ENCOUNTER — Encounter (HOSPITAL_COMMUNITY): Payer: Medicare HMO | Admitting: Physical Therapy

## 2020-04-14 ENCOUNTER — Ambulatory Visit (INDEPENDENT_AMBULATORY_CARE_PROVIDER_SITE_OTHER): Payer: Medicare HMO | Admitting: Podiatry

## 2020-04-14 DIAGNOSIS — M2031 Hallux varus (acquired), right foot: Secondary | ICD-10-CM | POA: Diagnosis not present

## 2020-04-14 DIAGNOSIS — L989 Disorder of the skin and subcutaneous tissue, unspecified: Secondary | ICD-10-CM | POA: Diagnosis not present

## 2020-04-16 ENCOUNTER — Encounter (HOSPITAL_COMMUNITY): Payer: Medicare HMO | Admitting: Physical Therapy

## 2020-04-20 ENCOUNTER — Other Ambulatory Visit (HOSPITAL_COMMUNITY): Payer: Self-pay | Admitting: Family Medicine

## 2020-04-20 DIAGNOSIS — Z1231 Encounter for screening mammogram for malignant neoplasm of breast: Secondary | ICD-10-CM

## 2020-04-28 NOTE — Progress Notes (Signed)
   HPI: 80 y.o. female presenting today for evaluation of symptomatic calluses that have developed to the patient's right foot.  They're very tender to palpation and they're affecting her ability to walk without pain.  She presents for further treatment and evaluation  Past Medical History:  Diagnosis Date  . Anemia   . Anxiety   . Arthritis   . COPD (chronic obstructive pulmonary disease) (HCC)   . Depression   . Dry eyes 04/14/2014  . GERD (gastroesophageal reflux disease)   . Glaucoma   . Gout   . Gout 10/23/2018  . HTN (hypertension)      Physical Exam: General: The patient is alert and oriented x3 in no acute distress.  Dermatology: Skin is warm, dry and supple bilateral lower extremities. Negative for open lesions or macerations.  Hyperkeratotic preulcerative callus lesion noted to the lateral aspect of the fifth MTPJ and second digit right foot  Vascular: Palpable pedal pulses bilaterally. No edema or erythema noted. Capillary refill within normal limits.  Neurological: Epicritic and protective threshold grossly intact bilaterally.   Musculoskeletal Exam: History of bunion and hammertoe correction right foot with iatrogenic development of hallux varus right.  Contracture at the level of the IPJ noted to the right hallux with a prominent metatarsal head that appears to be irritated and rubbing with shoe gear   Assessment: 1.  Hallux varus right 2.  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  Preulcerative callus lesion right fifth MTPJ and right second digit  Plan of Care:  1. Patient evaluated.  2.  Continue silicone toe cap and good supportive shoes daily 3.  Excisional debridement of the hyperkeratotic callus lesion was performed to the second digit of the right foot and subfifth MTPJ using a 312 scalpel without incident or bleeding 4.  Corn pads provided to offload pressure from the area 5.  Return to clinic as needed     Felecia Shelling, DPM [facility]  Dr. Felecia Shelling, DPM    [address] [place], [place] [address]                Office [phone]  Fax [phone]

## 2020-05-12 ENCOUNTER — Ambulatory Visit: Payer: Medicare HMO | Admitting: Internal Medicine

## 2020-05-25 ENCOUNTER — Other Ambulatory Visit (HOSPITAL_COMMUNITY)
Admission: RE | Admit: 2020-05-25 | Discharge: 2020-05-25 | Disposition: A | Discharge status: Home / Self Care | Payer: Medicare HMO | Source: Ambulatory Visit | Attending: Internal Medicine | Admitting: Internal Medicine

## 2020-05-25 ENCOUNTER — Ambulatory Visit (INDEPENDENT_AMBULATORY_CARE_PROVIDER_SITE_OTHER): Payer: Medicare HMO | Admitting: Internal Medicine

## 2020-05-25 ENCOUNTER — Encounter: Payer: Self-pay | Admitting: Internal Medicine

## 2020-05-25 ENCOUNTER — Ambulatory Visit (HOSPITAL_COMMUNITY)
Admission: RE | Admit: 2020-05-25 | Discharge: 2020-05-25 | Disposition: A | Discharge status: Home / Self Care | Payer: Medicare HMO | Source: Ambulatory Visit | Attending: Internal Medicine | Admitting: Internal Medicine

## 2020-05-25 ENCOUNTER — Other Ambulatory Visit: Payer: Self-pay

## 2020-05-25 DIAGNOSIS — R0609 Other forms of dyspnea: Secondary | ICD-10-CM

## 2020-05-25 DIAGNOSIS — R0600 Dyspnea, unspecified: Secondary | ICD-10-CM | POA: Insufficient documentation

## 2020-05-25 LAB — CBC WITH DIFFERENTIAL/PLATELET
Abs Immature Granulocytes: 0.01 10*3/uL (ref 0.00–0.07)
Basophils Absolute: 0.1 10*3/uL (ref 0.0–0.1)
Basophils Relative: 1 %
Eosinophils Absolute: 0.1 10*3/uL (ref 0.0–0.5)
Eosinophils Relative: 4 %
HCT: 40.2 % (ref 36.0–46.0)
Hemoglobin: 12.7 g/dL (ref 12.0–15.0)
Immature Granulocytes: 0 %
Lymphocytes Relative: 31 %
Lymphs Abs: 1.1 10*3/uL (ref 0.7–4.0)
MCH: 30.8 pg (ref 26.0–34.0)
MCHC: 31.6 g/dL (ref 30.0–36.0)
MCV: 97.6 fL (ref 80.0–100.0)
Monocytes Absolute: 0.5 10*3/uL (ref 0.1–1.0)
Monocytes Relative: 15 %
Neutro Abs: 1.7 10*3/uL (ref 1.7–7.7)
Neutrophils Relative %: 49 %
Platelets: 195 10*3/uL (ref 150–400)
RBC: 4.12 MIL/uL (ref 3.87–5.11)
RDW: 14.6 % (ref 11.5–15.5)
WBC: 3.6 10*3/uL — ABNORMAL LOW (ref 4.0–10.5)
nRBC: 0 % (ref 0.0–0.2)

## 2020-05-25 LAB — BASIC METABOLIC PANEL
Anion gap: 10 (ref 5–15)
BUN: 15 mg/dL (ref 8–23)
CO2: 25 mmol/L (ref 22–32)
Calcium: 9 mg/dL (ref 8.9–10.3)
Chloride: 100 mmol/L (ref 98–111)
Creatinine, Ser: 0.97 mg/dL (ref 0.44–1.00)
GFR, Estimated: 60 mL/min — ABNORMAL LOW (ref >60)
Glucose, Bld: 106 mg/dL — ABNORMAL HIGH (ref 70–99)
Potassium: 3.9 mmol/L (ref 3.5–5.1)
Sodium: 135 mmol/L (ref 135–145)

## 2020-05-25 LAB — BRAIN NATRIURETIC PEPTIDE: B Natriuretic Peptide: 43 pg/mL (ref 0.0–100.0)

## 2020-05-25 MED ORDER — AZITHROMYCIN 250 MG PO TABS: ORAL_TABLET | ORAL | 0 refills | Status: DC

## 2020-05-25 MED ORDER — STIOLTO RESPIMAT 2.5-2.5 MCG/ACT IN AERS: 2.0000 | INHALATION_SPRAY | Freq: Every day | RESPIRATORY_TRACT | 0 refills | Status: DC

## 2020-05-25 MED ORDER — OMEPRAZOLE 20 MG PO CPDR: DELAYED_RELEASE_CAPSULE | ORAL | 11 refills | Status: DC

## 2020-05-25 NOTE — Assessment & Plan Note (Addendum)
Onset fall 2021  - 05/25/2020  After extensive coaching inhaler device,  effectiveness =    0 baseline but >75% with coaching smi > continue stiolto 2 each am   Symptoms are  disproportionate to objective findings and not clear to what extent this is actually a pulmonary  problem but pt does appear to have difficult to sort out respiratory symptoms of unknown origin for which  DDX  = almost all start with A and  include Adherence, Ace Inhibitors, Acid Reflux, Active Sinus Disease, Alpha 1 Antitripsin deficiency, Anxiety masquerading as Airways dz,  ABPA,  Allergy(esp in young), Aspiration (esp in elderly), Adverse effects of meds,  Active smoking or Vaping, A bunch of PE's/clot burden (a few small clots can't cause this syndrome unless there is already severe underlying pulm or vascular dz with poor reserve),  Anemia or thyroid disorder, plus two Bs  = Bronchiectasis and Beta blocker use..and one C= CHF   Adherence is always the initial "prime suspect" and is a multilayered concern that requires a "trust but verify" approach in every patient - starting with knowing how to use medications, especially inhalers, correctly, keeping up with refills and understanding the fundamental difference between maintenance and prns vs those medications only taken for a very short course and then stopped and not refilled.  - see hfa teaching - return with all meds in hand using a trust but verify approach to confirm accurate Medication  Reconciliation The principal here is that until we are certain that the  patients are doing what we've asked, it makes no sense to ask them to do more.   ? Acid (or non-acid) GERD > always difficult to exclude as up to 75% of pts in some series report no assoc GI/ Heartburn symptoms> rec max (24h)  acid suppression and diet restrictions/ reviewed and instructions given in writing.   ? Adverse drug effects > note worse on dpi which were stopped so continue the mildest available maint inhaler  for Pt who is Group B in terms of symptom/risk and laba/lama therefore appropriate rx at this point >>> continue stiolto  ? Allergy/ asthma > check profile, use saba prn I spent extra time with pt today reviewing appropriate use of albuterol for prn use on exertion with the following points: 1) saba is for relief of sob that does not improve by walking a slower pace or resting but rather if the pt does not improve after trying this first. 2) If the pt is convinced, as many are, that saba helps recover from activity faster then it's easy to tell if this is the case by re-challenging : ie stop, take the inhaler, then p 5 minutes try the exact same activity (intensity of workload) that just caused the symptoms and see if they are substantially diminished or not after saba 3) if there is an activity that reproducibly causes the symptoms, try the saba 15 min before the activity on alternate days   If in fact the saba really does help, then fine to continue to use it prn but advised may need to look closer at the maintenance regimen being used to achieve better control of airways disease with exertion.   Active sinus dz/ rhinitis/ bronchitis > zpak only for now as doubt much in terms of infection  Anemia / thryroid disorder > see labs  ? Anxiety/depression / deconditioning  > usually at the bottom of this list of usual suspects but note already on celexa and depression  may interfere with adherence and also interpretation of response or lack thereof to symptom management which can be quite subjective.   ? CHF > excluded by cxr/ absence of orthopnea/pna dn with bnp so low today though note peripheral edema and may need echo to sort out issue of ? Cor pulmonale     >  F/u with pfts in 4-6 weeks          Each maintenance medication was reviewed in detail including emphasizing most importantly the difference between maintenance and prns and under what circumstances the prns are to be triggered using  an action plan format where appropriate.  Total time for H and P, chart review, counseling, reviewing smi  device(s) and generating customized AVS unique to this office visit / same day charting = 35 min

## 2020-05-25 NOTE — Patient Instructions (Signed)
Plan A = Automatic = Always=    Stiolto 2 pffs 1st thing each am   Work on inhaler technique:  relax and gently blow all the way out then take a nice smooth deep breath back in, triggering the inhaler at same time you start breathing in.  Sherrine Maples out thru nose. Rinse and gargle with water when done  Remember to do the practice breaths first      Plan B = Backup (to supplement plan A, not to replace it) Only use your albuterol inhaler as a rescue medication to be used if you can't catch your breath by resting or doing a relaxed purse lip breathing pattern.  - The less you use it, the better it will work when you need it. - Ok to use the inhaler up to 2 puffs  every 4 hours if you must but call for appointment if use goes up over your usual need - Don't leave home without it !!  (think of it like the spare tire for your car)     zpak should turn mucus white  Prilosec 20 mg Take 30- 60 min before your first and last meals of the day   Please remember to go to the lab and x-ray department at [facility]   for your tests - we will call you with the results when they are available.      Please schedule a follow up office visit in 4 weeks, sooner if needed with pfts

## 2020-05-25 NOTE — Progress Notes (Signed)
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       History of Present Illness  05/25/2020  Pulmonary/ 1st office eval/ [facility] / [facility]  Chief Complaint  Patient presents with  . Pulmonary Consult    Referred by Dr. Bertram Savin.  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 She c/o cough x 6 months. Cough is occ prod with light green to yellow sputum.  She has DOE walking up stairs. She uses proair 4-5 x per wk.   Dyspnea:  [employer] 1-2 aisles x one year  Cough: x 6 mo/ assoc hoarseness  Sleep: bed is flat/ 2 pillows no problem SABA use: poor techniuque/ hoarse worse with dpi   No obvious day to day or daytime variability or assoc   mucus plugs or hemoptysis or cp or chest tightness, subjective wheeze or overt sinus or hb symptoms.   seeleeping as abovewithout nocturnal  or early am exacerbation  of respiratory  c/o's or need for noct saba. Also denies any obvious fluctuation of symptoms with weather or environmental changes or other aggravating or alleviating factors except as outlined above   No unusual exposure hx or h/o childhood pna/ asthma or knowledge of premature birth.  Current Allergies, Complete Past Medical History, Past Surgical History, Family History, and Social History were reviewed in [name].  ROS  The following are not active complaints unless bolded Hoarseness, sore throat, dysphagia, dental problems, itching, sneezing,  nasal congestion or discharge of excess mucus or purulent secretions, ear ache,   fever, chills, sweats, unintended wt loss or wt gain, classically pleuritic or exertional cp,  orthopnea pnd or arm/hand swelling  or leg swelling, presyncope, palpitations, abdominal pain, anorexia, nausea, vomiting, diarrhea  or change  in bowel habits or change in bladder habits, change in stools or change in urine, dysuria, hematuria,  rash, arthralgias, visual complaints, headache, numbness, weakness or ataxia or problems with walking or coordination,  change in mood or  memory.           Past Medical History:  Diagnosis Date  . Anemia   . Anxiety   . Arthritis   . COPD (chronic obstructive pulmonary disease) (HCC)   . Depression   . Dry eyes 04/14/2014  . GERD (gastroesophageal reflux disease)   . Glaucoma   . Gout   . Gout 10/23/2018  . HTN (hypertension)     Outpatient Medications Prior to Visit  Medication Sig Dispense Refill  . allopurinol (ZYLOPRIM) 300 MG tablet Take 0.5 tablets (150 mg total) by mouth daily. 45 tablet 1  . atorvastatin (LIPITOR) 10 MG tablet Take 1 tablet (10 mg total) by mouth daily. 30 tablet 2  . citalopram (CELEXA) 20 MG tablet Take 20 mg by mouth every morning.    . clotrimazole-betamethasone (LOTRISONE) cream Apply 1 application topically 2 (two) times daily. 30 g 0  . gabapentin (NEURONTIN) 400 MG capsule Take 400 mg by mouth 2 (two) times daily.    [name] HYDROcodone-acetaminophen (NORCO/VICODIN) 5-325 MG tablet hydrocodone 5 mg-acetaminophen 325 mg tablet  Take 1 tablet twice a day by oral route as needed.    [name] losartan-hydrochlorothiazide (HYZAAR) 100-25 MG tablet Take 1 tablet by mouth daily. 30 tablet  3  . omeprazole (PRILOSEC) 20 MG capsule Take 20 mg by mouth daily.    [name] PROAIR HFA 108 (90 BASE) MCG/ACT inhaler Inhale 2 puffs into the lungs every 6 (six) hours as needed for wheezing or shortness of breath.     . STIOLTO RESPIMAT 2.5-2.5 MCG/ACT AERS Inhale 1 puff into the lungs in the morning and at bedtime.    [name] zolpidem (AMBIEN) 10 MG tablet Take 1 tablet (10 mg total) by mouth at bedtime as needed for sleep. 30 tablet 0    No facility-administered medications prior to visit.     Objective:     BP 134/88 (BP Location: Left Arm, Cuff Size: Normal)   Pulse 91   Temp 97.9  F (36.6 C) (Temporal)   Ht 5' 5.5" (1.664 m)   Wt 174 lb (78.9 kg)   SpO2 99% Comment: on RA  BMI 28.51 kg/m   SpO2: 99 % (on RA)   Pleasant amb bf nad  HEENT : pt wearing mask not removed for exam due to covid - 19 concerns.   NECK :  without JVD/Nodes/TM/ nl carotid upstrokes bilaterally   LUNGS: no acc muscle use,  Min barrel  contour chest wall with bilateral  slightly decreased bs s audible wheeze and  without cough on insp or exp maneuvers and min  Hyperresonant  to  percussion bilaterally     CV:  RRR  no s3 or murmur or increase in P2, and pitting edema both LE despite elastic hose  ABD:  soft and nontender with pos end  insp Hoover's  in the supine position. No bruits or organomegaly appreciated, bowel sounds nl  MS:   Nl gait/  ext warm without deformities, calf tenderness, cyanosis or clubbing No obvious joint restrictions   SKIN: warm and dry without lesions    NEURO:  alert, approp, nl sensorium with  no motor or cerebellar deficits apparent.       CXR PA and Lateral:   05/25/2020 :    I personally reviewed images / impression as follows:   Lots of intestinal air LUQ/ mild T kyphosis/ no acute changes   Labs ordered/ reviewed:      Chemistry      Component Value Date/Time   NA 135 05/25/2020 1249   NA 143 01/13/2020 1458   K 3.9 05/25/2020 1249   CL 100 05/25/2020 1249   CO2 25 05/25/2020 1249   BUN 15 05/25/2020 1249   BUN 40 (H) 01/13/2020 1458   CREATININE 0.97 05/25/2020 1249      Component Value Date/Time   CALCIUM 9.0 05/25/2020 1249   ALKPHOS 116 01/13/2020 1458   AST 27 01/13/2020 1458   ALT 19 01/13/2020 1458   BILITOT 0.3 01/13/2020 1458        Lab Results  Component Value Date   WBC 3.6 (L) 05/25/2020   HGB 12.7 05/25/2020   HCT 40.2 05/25/2020   MCV 97.6 05/25/2020   PLT 195 05/25/2020       EOS                                                               0.1  05/25/2020      No results  found for: DDIMER    Lab Results  Component Value Date   TSH 1.200 01/13/2020      BNP   05/25/2020    = 43   Labs ordered 05/25/2020  :  allergy profile          Assessment   No problem-specific Assessment & Plan notes found for this encounter.     Sandrea Hughs, MD 05/25/2020

## 2020-05-26 ENCOUNTER — Encounter: Payer: Self-pay | Admitting: *Deleted

## 2020-05-27 ENCOUNTER — Ambulatory Visit (HOSPITAL_COMMUNITY)
Admission: RE | Admit: 2020-05-27 | Discharge: 2020-05-27 | Disposition: A | Discharge status: Home / Self Care | Payer: Medicare HMO | Source: Ambulatory Visit | Attending: Family Medicine | Admitting: Family Medicine

## 2020-05-27 ENCOUNTER — Other Ambulatory Visit: Payer: Self-pay

## 2020-05-27 DIAGNOSIS — Z1231 Encounter for screening mammogram for malignant neoplasm of breast: Secondary | ICD-10-CM | POA: Insufficient documentation

## 2020-05-28 LAB — IGE: IgE (Immunoglobulin E), Serum: 4 IU/mL — ABNORMAL LOW (ref 6–495)

## 2020-06-21 DIAGNOSIS — R251 Tremor, unspecified: Secondary | ICD-10-CM | POA: Insufficient documentation

## 2020-06-25 ENCOUNTER — Other Ambulatory Visit (HOSPITAL_COMMUNITY)
Admission: RE | Admit: 2020-06-25 | Discharge: 2020-06-25 | Disposition: A | Discharge status: Home / Self Care | Payer: Medicare HMO | Source: Ambulatory Visit | Attending: Internal Medicine | Admitting: Internal Medicine

## 2020-06-25 ENCOUNTER — Other Ambulatory Visit: Payer: Self-pay

## 2020-06-25 DIAGNOSIS — Z20822 Contact with and (suspected) exposure to covid-19: Secondary | ICD-10-CM | POA: Diagnosis not present

## 2020-06-25 DIAGNOSIS — Z01812 Encounter for preprocedural laboratory examination: Secondary | ICD-10-CM | POA: Diagnosis present

## 2020-06-26 LAB — SARS CORONAVIRUS 2 (TAT 6-24 HRS): SARS Coronavirus 2: NEGATIVE

## 2020-06-28 NOTE — Progress Notes (Signed)
Subjective:   Heather [name] is a 80 y.o. female who presents for an Initial Medicare Annual Wellness Visit.  I connected with Heather [name] today by telephone and verified that I am speaking with the correct person using two identifiers. Location patient: home Location provider: work Persons participating in the virtual visit: patient, provider.   I discussed the limitations, risks, security and privacy concerns of performing an evaluation and management service by telephone and the availability of in person appointments. I also discussed with the patient that there may be a patient responsible charge related to this service. The patient expressed understanding and verbally consented to this telephonic visit.    Interactive audio and video telecommunications were attempted between this provider and patient, however failed, due to patient having technical difficulties OR patient did not have access to video capability.  We continued and completed visit with audio only.      Review of Systems    N/A  Cardiac Risk Factors include: advanced age (>4855men, 50>65 women);dyslipidemia;hypertension     Objective:    Today's Vitals   06/30/20 1019  PainSc: 9    There is no height or weight on file to calculate BMI.  Advanced Directives 06/30/2020 10/28/2019 10/23/2018 10/19/2018 09/21/2017 08/04/2015 07/21/2015  Does Patient Have a Medical Advance Directive? Yes Yes No No No Yes Yes  Type of Estate agentAdvance Directive Healthcare Power of [place];Living will Healthcare Power of [place];Living will - - - - -  Does patient want to make changes to medical advance directive? No - Patient declined No - Patient declined - - - Yes - information given Yes - information given  Copy of Healthcare Power of Attorney in Chart? No - copy requested No - copy requested - - - No - copy requested No - copy requested  Would patient like information on creating a medical advance directive? - - Yes (MAU/Ambulatory/Procedural  Areas - Information given) No - Patient declined No - Patient declined - -    Current Medications (verified) Outpatient Encounter Medications as of 06/30/2020  Medication Sig  . allopurinol (ZYLOPRIM) 300 MG tablet Take 0.5 tablets (150 mg total) by mouth daily.  [name]. atorvastatin (LIPITOR) 10 MG tablet Take 1 tablet (10 mg total) by mouth daily.  . citalopram (CELEXA) 20 MG tablet Take 20 mg by mouth every morning.  . clotrimazole-betamethasone (LOTRISONE) cream Apply 1 application topically 2 (two) times daily.  [name]. gabapentin (NEURONTIN) 400 MG capsule Take 400 mg by mouth 2 (two) times daily.  [name]. HYDROcodone-acetaminophen (NORCO/VICODIN) 5-325 MG tablet hydrocodone 5 mg-acetaminophen 325 mg tablet  Take 1 tablet twice a day by oral route as needed.  [name]. losartan-hydrochlorothiazide (HYZAAR) 100-25 MG tablet Take 1 tablet by mouth daily.  [name]. omeprazole (PRILOSEC) 20 MG capsule Take 30- 60 min before your first and last meals of the day  . PROAIR HFA 108 (90 BASE) MCG/ACT inhaler Inhale 2 puffs into the lungs every 6 (six) hours as needed for wheezing or shortness of breath.   . Tiotropium Bromide-Olodaterol (STIOLTO RESPIMAT) 2.5-2.5 MCG/ACT AERS Inhale 2 puffs into the lungs daily.  [name]. zolpidem (AMBIEN) 10 MG tablet Take 1 tablet (10 mg total) by mouth at bedtime as needed for sleep.  . [DISCONTINUED] azithromycin (ZITHROMAX) 250 MG tablet Take 2 on day one then 1 daily x 4 days   No facility-administered encounter medications on file as of 06/30/2020.    Allergies (verified) Lovastatin and Lisinopril   History: Past Medical History:  Diagnosis Date  .  Anemia   . Anxiety   . Arthritis   . COPD (chronic obstructive pulmonary disease) (HCC)   . Depression   . Dry eyes 04/14/2014  . GERD (gastroesophageal reflux disease)   . Glaucoma   . Gout   . Gout 10/23/2018  . HTN (hypertension)    Past Surgical History:  Procedure Laterality Date  . ABDOMINAL HYSTERECTOMY    . BACK SURGERY      lumbar-disc  . BUNIONECTOMY Bilateral   . FOOT SURGERY Left    repair of "kissing Cousins"  . JOINT REPLACEMENT Bilateral    hip   . JOINT REPLACEMENT Right 2010 or 2012   knee  . KNEE SURGERY    . SHOULDER ARTHROSCOPY Right    shoulder surgery in [place] about 2000  . SHOULDER HEMI-ARTHROPLASTY Left 03/25/2014   Procedure: LEFT SHOULDER HEMI-ARTHROPLASTY;  Surgeon: Vickki Hearing, MD;  Location: [facility];  Service: Orthopedics;  Laterality: Left;   Family History  Problem Relation Age of Onset  . High blood pressure Mother   . Aneurysm Father   . Diabetes Brother   . Pancreatitis Brother   . Hypertension Niece   . Congestive Heart Failure Niece    Social History   Socioeconomic History  . Marital status: Divorced    Spouse name: Not on file  . Number of children: Not on file  . Years of education: Not on file  . Highest education level: Not on file  Occupational History  . Not on file  Tobacco Use  . Smoking status: Former Smoker    Packs/day: 1.00    Years: 40.00    Pack years: 40.00    Types: Cigarettes    Quit date: 03/20/1985    Years since quitting: 35.3  . Smokeless tobacco: Never Used  Substance and Sexual Activity  . Alcohol use: No  . Drug use: No  . Sexual activity: Yes    Birth control/protection: Surgical  Other Topics Concern  . Not on file  Social History Narrative   Mrs Prestigiacomo is a 80 year old divorced female who lives alone. She has support of her son and sister.    Social Determinants of Health   Financial Resource Strain: Low Risk   . Difficulty of Paying Living Expenses: Not hard at all  Food Insecurity: No Food Insecurity  . Worried About Programme researcher, broadcasting/film/video in the Last Year: Never true  . Ran Out of Food in the Last Year: Never true  Transportation Needs: No Transportation Needs  . Lack of Transportation (Medical): No  . Lack of Transportation (Non-Medical): No  Physical Activity: Insufficiently Active  . Days of Exercise per Week: 7  days  . Minutes of Exercise per Session: 20 min  Stress: No Stress Concern Present  . Feeling of Stress : Not at all  Social Connections: Moderately Isolated  . Frequency of Communication with Friends and Family: More than three times a week  . Frequency of Social Gatherings with Friends and Family: Once a week  . Attends Religious Services: More than 4 times per year  . Active Member of Clubs or Organizations: No  . Attends Banker Meetings: Never  . Marital Status: Widowed    Tobacco Counseling Counseling given: Not Answered   Clinical Intake:  Pre-visit preparation completed: Yes  Pain : 0-10 Pain Score: 9  Pain Location: Knee Pain Orientation: Left Pain Descriptors / Indicators: Sharp Pain Onset: More than a month ago Pain Frequency: Intermittent  Pain Relieving Factors: Ice, otc arthritis medication  Pain Relieving Factors: Ice, otc arthritis medication  Nutritional Risks: None Diabetes: No  How often do you need to have someone help you when you read instructions, pamphlets, or other written materials from your doctor or pharmacy?: 1 - Never  Diabetic?No  Interpreter Needed?: No  Information entered by :: SCrews,LPN   Activities of Daily Living In your present state of health, do you have any difficulty performing the following activities: 06/30/2020 01/13/2020  Hearing? N N  Vision? N N  Difficulty concentrating or making decisions? Y N  Comment has some issues with remembering -  Walking or climbing stairs? Malvin Johns  Comment has sob when climbing stairs -  Dressing or bathing? N N  Doing errands, shopping? N N  Preparing Food and eating ? N -  Using the Toilet? N -  In the past six months, have you accidently leaked urine? N -  Do you have problems with loss of bowel control? N -  Managing your Medications? N -  Managing your Finances? N -  Housekeeping or managing your Housekeeping? N -  Some recent data might be hidden    Patient Care  Team: Anabel Halon, MD as PCP - General (Internal Medicine) Beryle Beams, MD as Consulting Physician (Neurology) Vickki Hearing, MD as Consulting Physician (Orthopedic Surgery)  Indicate any recent Medical Services you may have received from other than [facility] providers in the past year (date may be approximate).     Assessment:   This is a routine wellness examination for Heather [name].  Hearing/Vision screen  Hearing Screening   125Hz  250Hz  500Hz  1000Hz  2000Hz  3000Hz  4000Hz  6000Hz  8000Hz   Right ear:           Left ear:           Vision Screening Comments: Patients states gets eyes examined once a year. Has hx of bilateral cataract extraction. Patient states wears reading glasses   Dietary issues and exercise activities discussed: Current Exercise Habits: Home exercise routine, Type of exercise: Other - see comments (stationary bicycle), Time (Minutes): 25, Frequency (Times/Week): 7, Weekly Exercise (Minutes/Week): 175, Intensity: Mild, Exercise limited by: orthopedic condition(s);respiratory conditions(s)  Goals    . DIET - EAT MORE FRUITS AND VEGETABLES    . Exercise 3x per week (30 min per time)     Trying to ride stationary bicycle on daily basis for at least 30 minutes     . Weight (lb) < 165 lb (74.8 kg)      Depression Screen PHQ 2/9 Scores 06/30/2020 02/03/2020 01/16/2020 01/13/2020 10/23/2018  PHQ - 2 Score 0 0 0 0 1    Fall Risk Fall Risk  06/30/2020 02/03/2020 01/16/2020 01/13/2020  Falls in the past year? 0 0 0 0  Number falls in past yr: 0 0 0 0  Injury with Fall? 0 0 0 0  Risk for fall due to : Orthopedic patient No Fall Risks No Fall Risks No Fall Risks  Follow up Falls evaluation completed;Falls prevention discussed Falls evaluation completed Falls evaluation completed Falls evaluation completed    FALL RISK PREVENTION PERTAINING TO THE HOME:  Any stairs in or around the home? No  If so, are there any without handrails? No  Home free of loose throw rugs in  walkways, pet beds, electrical cords, etc? Yes  Adequate lighting in your home to reduce risk of falls? Yes   ASSISTIVE DEVICES UTILIZED TO PREVENT FALLS:  Life alert? No  Use of a cane, walker or w/c? Yes  Grab bars in the bathroom? Yes  Shower chair or bench in shower? Yes  Elevated toilet seat or a handicapped toilet? Yes     Cognitive Function:     6CIT Screen 06/30/2020  What Year? 0 points  What month? 0 points  What time? 0 points  Count back from 20 0 points  Months in reverse 0 points  Repeat phrase 4 points  Total Score 4    Immunizations Immunization History  Administered Date(s) Administered  . Influenza-Unspecified 12/28/2017, 12/07/2019  . Moderna Sars-Covid-2 Vaccination 05/01/2019, 05/30/2019, 12/04/2019  . Pneumococcal Polysaccharide-23 04/11/2016, 03/10/2020  . Tdap 01/13/2020  . Zoster 04/14/2016    TDAP status: Up to date  Flu Vaccine status: Up to date  Pneumococcal vaccine status: Up to date  Covid-19 vaccine status: Completed vaccines  Qualifies for Shingles Vaccine? Yes   Zostavax completed Yes   Shingrix Completed?: No.    Education has been provided regarding the importance of this vaccine. Patient has been advised to call insurance company to determine out of pocket expense if they have not yet received this vaccine. Advised may also receive vaccine at local pharmacy or Health Dept. Verbalized acceptance and understanding.  Screening Tests Health Maintenance  Topic Date Due  . Hepatitis C Screening  Never done  . INFLUENZA VACCINE  10/04/2020  . PNA vac Low Risk Adult (2 of 2 - PCV13) 03/10/2021  . MAMMOGRAM  05/27/2021  . TETANUS/TDAP  01/12/2030  . DEXA SCAN  Completed  . COVID-19 Vaccine  Completed  . HPV VACCINES  Aged Out    Health Maintenance  Health Maintenance Due  Topic Date Due  . Hepatitis C Screening  Never done    Colorectal cancer screening: No longer required.   Mammogram status: Completed 05/27/2020.  Repeat every year  Bone Density status: Ordered 06/30/2020. Pt provided with contact info and advised to call to schedule appt.  Lung Cancer Screening: (Low Dose CT Chest recommended if Age 17-80 years, 30 pack-year currently smoking OR have quit w/in 15years.) does not qualify.   Lung Cancer Screening Referral: N/A   Additional Screening:  Hepatitis C Screening: does qualify;   Vision Screening: Recommended annual ophthalmology exams for early detection of glaucoma and other disorders of the eye. Is the patient up to date with their annual eye exam?  Yes  Who is the provider or what is the name of the office in which the patient attends annual eye exams? Dr.Mark Charise Killian  If pt is not established with a provider, would they like to be referred to a provider to establish care? No .   Dental Screening: Recommended annual dental exams for proper oral hygiene  Community Resource Referral / Chronic Care Management: CRR required this visit?  No   CCM required this visit?  No      Plan:     I have personally reviewed and noted the following in the patient's chart:   . Medical and social history . Use of alcohol, tobacco or illicit drugs  . Current medications and supplements . Functional ability and status . Nutritional status . Physical activity . Advanced directives . List of other physicians . Hospitalizations, surgeries, and ER visits in previous 12 months . Vitals . Screenings to include cognitive, depression, and falls . Referrals and appointments  In addition, I have reviewed and discussed with patient certain preventive protocols, quality metrics, and best practice recommendations. A written personalized care plan  for preventive services as well as general preventive health recommendations were provided to patient.     Theodora Blow, LPN   [id number]   Nurse Notes: None

## 2020-06-29 ENCOUNTER — Ambulatory Visit (HOSPITAL_COMMUNITY)
Admission: RE | Admit: 2020-06-29 | Discharge: 2020-06-29 | Disposition: A | Discharge status: Home / Self Care | Payer: Medicare HMO | Source: Ambulatory Visit | Attending: Internal Medicine | Admitting: Internal Medicine

## 2020-06-29 ENCOUNTER — Other Ambulatory Visit: Payer: Self-pay

## 2020-06-29 DIAGNOSIS — R0600 Dyspnea, unspecified: Secondary | ICD-10-CM | POA: Insufficient documentation

## 2020-06-29 LAB — PULMONARY FUNCTION TEST
DL/VA % pred: 62 %
DL/VA: 2.6 ml/min/mmHg/L
DLCO unc % pred: 58 %
DLCO unc: 10.28 ml/min/mmHg
FEF 25-75 Post: 0.93 L/sec
FEF 25-75 Pre: 0.85 L/sec
FEF2575-%Change-Post: 9 %
FEF2575-%Pred-Post: 74 %
FEF2575-%Pred-Pre: 68 %
FEV1-%Change-Post: 2 %
FEV1-%Pred-Post: 97 %
FEV1-%Pred-Pre: 94 %
FEV1-Post: 1.4 L
FEV1-Pre: 1.37 L
FEV1FVC-%Change-Post: 0 %
FEV1FVC-%Pred-Pre: 90 %
FEV6-%Change-Post: 4 %
FEV6-%Pred-Post: 113 %
FEV6-%Pred-Pre: 109 %
FEV6-Post: 2.03 L
FEV6-Pre: 1.95 L
FEV6FVC-%Change-Post: 0 %
FEV6FVC-%Pred-Post: 103 %
FEV6FVC-%Pred-Pre: 103 %
FVC-%Change-Post: 3 %
FVC-%Pred-Post: 109 %
FVC-%Pred-Pre: 105 %
FVC-Post: 2.05 L
FVC-Pre: 1.99 L
Post FEV1/FVC ratio: 68 %
Post FEV6/FVC ratio: 99 %
Pre FEV1/FVC ratio: 69 %
Pre FEV6/FVC Ratio: 98 %
RV % pred: 190 %
RV: 4.28 L
TLC % pred: 137 %
TLC: 6.52 L

## 2020-06-29 MED ORDER — ALBUTEROL SULFATE (2.5 MG/3ML) 0.083% IN NEBU
2.5000 mg | INHALATION_SOLUTION | Freq: Once | RESPIRATORY_TRACT | Status: AC
  Administered 2020-06-29: 2.5 mg via RESPIRATORY_TRACT

## 2020-06-30 ENCOUNTER — Ambulatory Visit (INDEPENDENT_AMBULATORY_CARE_PROVIDER_SITE_OTHER): Payer: Medicare HMO

## 2020-06-30 DIAGNOSIS — Z0000 Encounter for general adult medical examination without abnormal findings: Secondary | ICD-10-CM

## 2020-06-30 NOTE — Patient Instructions (Signed)
Heather [name] , Thank you for taking time to come for your Medicare Wellness Visit. I appreciate your ongoing commitment to your health goals. Please review the following plan we discussed and let me know if I can assist you in the future.   Screening recommendations/referrals: Colonoscopy: No longer required Mammogram: Up to date, next due 05/27/2020  Bone Density: Up to date, next due 06/2025 Recommended yearly ophthalmology/optometry visit for glaucoma screening and checkup Recommended yearly dental visit for hygiene and checkup  Vaccinations: Influenza vaccine: Up to date, next due fall 2022  Pneumococcal vaccine: Up to date, next due 03/10/2021 Tdap vaccine: Up to date, next due 01/12/2030 Shingles vaccine: Currently due for Shingrix, if you would like to receive we recommend that you do so at your local pharmacy.     Advanced directives: Please bring copies of your advanced medical directives so that we may scan them into your chart.   Conditions/risks identified: None   Next appointment: None    Preventive Care 65 Years and Older, Female Preventive care refers to lifestyle choices and visits with your health care provider that can promote health and wellness. What does preventive care include?  A yearly physical exam. This is also called an annual well check.  Dental exams once or twice a year.  Routine eye exams. Ask your health care provider how often you should have your eyes checked.  Personal lifestyle choices, including:  Daily care of your teeth and gums.  Regular physical activity.  Eating a healthy diet.  Avoiding tobacco and drug use.  Limiting alcohol use.  Practicing safe sex.  Taking low-dose aspirin every day.  Taking vitamin and mineral supplements as recommended by your health care provider. What happens during an annual well check? The services and screenings done by your health care provider during your annual well check will depend on your age,  overall health, lifestyle risk factors, and family history of disease. Counseling  Your health care provider may ask you questions about your:  Alcohol use.  Tobacco use.  Drug use.  Emotional well-being.  Home and relationship well-being.  Sexual activity.  Eating habits.  History of falls.  Memory and ability to understand (cognition).  Work and work Astronomer.  Reproductive health. Screening  You may have the following tests or measurements:  Height, weight, and BMI.  Blood pressure.  Lipid and cholesterol levels. These may be checked every 5 years, or more frequently if you are over 33 years old.  Skin check.  Lung cancer screening. You may have this screening every year starting at age 1 if you have a 30-pack-year history of smoking and currently smoke or have quit within the past 15 years.  Fecal occult blood test (FOBT) of the stool. You may have this test every year starting at age 29.  Flexible sigmoidoscopy or colonoscopy. You may have a sigmoidoscopy every 5 years or a colonoscopy every 10 years starting at age 64.  Hepatitis C blood test.  Hepatitis B blood test.  Sexually transmitted disease (STD) testing.  Diabetes screening. This is done by checking your blood sugar (glucose) after you have not eaten for a while (fasting). You may have this done every 1-3 years.  Bone density scan. This is done to screen for osteoporosis. You may have this done starting at age 33.  Mammogram. This may be done every 1-2 years. Talk to your health care provider about how often you should have regular mammograms. Talk with your health care provider  about your test results, treatment options, and if necessary, the need for more tests. Vaccines  Your health care provider may recommend certain vaccines, such as:  Influenza vaccine. This is recommended every year.  Tetanus, diphtheria, and acellular pertussis (Tdap, Td) vaccine. You may need a Td booster every 10  years.  Zoster vaccine. You may need this after age 73.  Pneumococcal 13-valent conjugate (PCV13) vaccine. One dose is recommended after age 51.  Pneumococcal polysaccharide (PPSV23) vaccine. One dose is recommended after age 53. Talk to your health care provider about which screenings and vaccines you need and how often you need them. This information is not intended to replace advice given to you by your health care provider. Make sure you discuss any questions you have with your health care provider. Document Released: 03/19/2015 Document Revised: 11/10/2015 Document Reviewed: 12/22/2014 Elsevier Interactive Patient Education  2017 [place] Prevention in the Home Falls can cause injuries. They can happen to people of all ages. There are many things you can do to make your home safe and to help prevent falls. What can I do on the outside of my home?  Regularly fix the edges of walkways and driveways and fix any cracks.  Remove anything that might make you trip as you walk through a door, such as a raised step or threshold.  Trim any bushes or trees on the path to your home.  Use bright outdoor lighting.  Clear any walking paths of anything that might make someone trip, such as rocks or tools.  Regularly check to see if handrails are loose or broken. Make sure that both sides of any steps have handrails.  Any raised decks and porches should have guardrails on the edges.  Have any leaves, snow, or ice cleared regularly.  Use sand or salt on walking paths during winter.  Clean up any spills in your garage right away. This includes oil or grease spills. What can I do in the bathroom?  Use night lights.  Install grab bars by the toilet and in the tub and shower. Do not use towel bars as grab bars.  Use non-skid mats or decals in the tub or shower.  If you need to sit down in the shower, use a plastic, non-slip stool.  Keep the floor dry. Clean up any water that  spills on the floor as soon as it happens.  Remove soap buildup in the tub or shower regularly.  Attach bath mats securely with double-sided non-slip rug tape.  Do not have throw rugs and other things on the floor that can make you trip. What can I do in the bedroom?  Use night lights.  Make sure that you have a light by your bed that is easy to reach.  Do not use any sheets or blankets that are too big for your bed. They should not hang down onto the floor.  Have a firm chair that has side arms. You can use this for support while you get dressed.  Do not have throw rugs and other things on the floor that can make you trip. What can I do in the kitchen?  Clean up any spills right away.  Avoid walking on wet floors.  Keep items that you use a lot in easy-to-reach places.  If you need to reach something above you, use a strong step stool that has a grab bar.  Keep electrical cords out of the way.  Do not use floor polish or  wax that makes floors slippery. If you must use wax, use non-skid floor wax.  Do not have throw rugs and other things on the floor that can make you trip. What can I do with my stairs?  Do not leave any items on the stairs.  Make sure that there are handrails on both sides of the stairs and use them. Fix handrails that are broken or loose. Make sure that handrails are as long as the stairways.  Check any carpeting to make sure that it is firmly attached to the stairs. Fix any carpet that is loose or worn.  Avoid having throw rugs at the top or bottom of the stairs. If you do have throw rugs, attach them to the floor with carpet tape.  Make sure that you have a light switch at the top of the stairs and the bottom of the stairs. If you do not have them, ask someone to add them for you. What else can I do to help prevent falls?  Wear shoes that:  Do not have high heels.  Have rubber bottoms.  Are comfortable and fit you well.  Are closed at the  toe. Do not wear sandals.  If you use a stepladder:  Make sure that it is fully opened. Do not climb a closed stepladder.  Make sure that both sides of the stepladder are locked into place.  Ask someone to hold it for you, if possible.  Clearly mark and make sure that you can see:  Any grab bars or handrails.  First and last steps.  Where the edge of each step is.  Use tools that help you move around (mobility aids) if they are needed. These include:  Canes.  Walkers.  Scooters.  Crutches.  Turn on the lights when you go into a dark area. Replace any light bulbs as soon as they burn out.  Set up your furniture so you have a clear path. Avoid moving your furniture around.  If any of your floors are uneven, fix them.  If there are any pets around you, be aware of where they are.  Review your medicines with your doctor. Some medicines can make you feel dizzy. This can increase your chance of falling. Ask your doctor what other things that you can do to help prevent falls. This information is not intended to replace advice given to you by your health care provider. Make sure you discuss any questions you have with your health care provider. Document Released: 12/17/2008 Document Revised: 07/29/2015 Document Reviewed: 03/27/2014 Elsevier Interactive Patient Education  2017 [employer].

## 2020-07-15 ENCOUNTER — Encounter: Payer: Self-pay | Admitting: Orthopedic Surgery

## 2020-07-15 ENCOUNTER — Ambulatory Visit: Payer: Medicare HMO | Admitting: Orthopedic Surgery

## 2020-07-15 ENCOUNTER — Other Ambulatory Visit: Payer: Self-pay

## 2020-07-15 ENCOUNTER — Ambulatory Visit: Payer: Medicare HMO

## 2020-07-15 VITALS — BP 134/75 | HR 93 | Ht 65.5 in | Wt 177.0 lb

## 2020-07-15 DIAGNOSIS — M25562 Pain in left knee: Secondary | ICD-10-CM | POA: Diagnosis not present

## 2020-07-15 DIAGNOSIS — M1120 Other chondrocalcinosis, unspecified site: Secondary | ICD-10-CM

## 2020-07-15 NOTE — Patient Instructions (Signed)
Calcium Pyrophosphate Deposition Calcium pyrophosphate deposition (CPPD) is a type of arthritis that causes pain, swelling, and inflammation in a joint. Attacks of CPPD may come and go. The joint pain can be severe and may last for days to weeks. This condition usually affects one joint at a time. The knees are most often affected, but this condition can also affect the wrists, elbows, shoulders, or ankles. CPPD may also be called pseudogout because it is similar to gout. Both conditions result from the buildup of crystals in a joint. However, CPPD is caused by a type of crystal that is different from the crystals that cause gout. What are the causes? This condition is caused by the buildup of calcium pyrophosphate dihydrate crystals in a joint. The reason why this buildup occurs is not known. An increased likelihood of having this condition (predisposition) may be passed from parent to child (is hereditary). What increases the risk? This condition is more likely to develop in people who:  Are older than 79 years of age.  Have a family history of CPPD.  Have certain medical conditions, such as hemophilia, amyloidosis, or overactive parathyroid glands.  Have low levels of magnesium in the blood. What are the signs or symptoms? Symptoms of this condition include:  Joint pain. The pain may: ? Be intense and constant. ? Develop quickly. ? Get worse with movement. ? Last from several days to a few weeks.  Redness, swelling, stiffness, and warmth at the joint.   How is this diagnosed? To diagnose this condition, your health care provider will use a needle to remove fluid from the joint. The fluid will be examined for the crystals that cause CPPD. You also may have additional tests, such as:  Blood tests.  X-rays.  Ultrasound.  MRI. How is this treated? There is no way to remove the crystals from the joint and no cure for this condition. However, treatment can relieve symptoms and  improve joint function. Treatment may include:  NSAIDs to reduce inflammation and pain.  Removing some of the fluid and crystals from around the joint with a needle.  Injections of medicine (cortisone) into the joint to reduce pain and swelling.  Medicines to help prevent attacks.  Physical therapy to improve joint function. Follow these instructions at home: Managing pain, stiffness, and swelling  Rest the affected joint until your symptoms start to go away.  If directed, put ice on the affected area to relieve pain and swelling: ? Put ice in a plastic bag. ? Place a towel between your skin and the bag. ? Leave the ice on for 20 minutes, 2-3 times a day.  Keep your affected joint raised (elevated) above the level of your heart, when possible. This will help to reduce swelling. For example, prop your foot up on a chair while sitting down to elevate your knee.   General instructions  If the painful joint is in your leg, use crutches as told by your health care provider.  Take over-the-counter and prescription medicines only as told by your health care provider.  When your symptoms start to go away, begin to exercise regularly or do physical therapy. Talk with your health care provider or physical therapist about what types of exercise are safe for you. Exercise that is easier on your joints (low-impact exercise) may be best. This includes walking, swimming, bicycling, and water aerobics.  Maintain a healthy weight. Excess weight puts stress on your joints.  Keep all follow-up visits as told by your   health care provider and physical therapist. This is important. Contact a health care provider if you:  Notice that your symptoms get worse.  Develop a skin rash.  Notice that your pain gets worse. Get help right away if you:  Have a fever.  Have difficulty breathing.  Are taking NSAIDs and you: ? Vomit blood. ? Have blood in your stool. ? Have stool that is tarry and  black. Summary  Calcium pyrophosphate deposition (CPPD) is a type of arthritis that causes pain, swelling, and inflammation in a joint. The knees are most often affected, but it can also affect the wrists, elbows, shoulders, or ankles.  CPPD is caused by the buildup of calcium crystals in a joint. The reason why this occurs is not known.  Attacks of CPPD may come and go. The joint pain can be severe and may last for days to weeks.  There is no way to remove the crystals from the joint and no cure for this condition. However, treatment can relieve symptoms and improve joint function.  Rest the affected joint until your symptoms start to go away. After your symptoms go away, begin to exercise regularly or do physical therapy. This information is not intended to replace advice given to you by your health care provider. Make sure you discuss any questions you have with your health care provider. Document Revised: 06/17/2019 Document Reviewed: 06/17/2019 Elsevier Patient Education  2021 Elsevier Inc.  

## 2020-07-15 NOTE — Progress Notes (Signed)
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 Knee Pain    Lt knee pain for 6 wks. Heather [name]    80 year old female status post bilateral hip revisions presents with acute onset of left knee pain swelling decreased range of motion  No trauma  She has 3 a stage kidney disease history of gout as well   Review of Systems  Constitutional: Negative for chills and fever.  Skin: Negative.  Negative for itching and rash.     Past Medical History:  Diagnosis Date  . Anemia   . Anxiety   . Arthritis   . COPD (chronic obstructive pulmonary disease) (HCC)   . Depression   . Dry eyes 04/14/2014  . GERD (gastroesophageal reflux disease)   . Glaucoma   . Gout   . Gout 10/23/2018  . HTN (hypertension)     Past Surgical History:  Procedure Laterality Date  . ABDOMINAL HYSTERECTOMY    . BACK SURGERY     lumbar-disc  . BUNIONECTOMY Bilateral   . FOOT SURGERY Left    repair of "kissing Cousins"  . JOINT REPLACEMENT Bilateral    hip   . JOINT REPLACEMENT Right 2010 or 2012   knee  . KNEE SURGERY    . SHOULDER ARTHROSCOPY Right    shoulder surgery in [place] about 2000  . SHOULDER HEMI-ARTHROPLASTY Left 03/25/2014   Procedure: LEFT SHOULDER HEMI-ARTHROPLASTY;  Surgeon: Vickki Hearing, MD;  Location: [facility];  Service: Orthopedics;  Laterality: Left;    Family History  Problem Relation Age of Onset  . High blood pressure Mother   . Aneurysm Father   . Diabetes Brother   . Pancreatitis Brother   . Hypertension Niece   . Congestive Heart Failure Niece    Social History   Tobacco Use  . Smoking status: Former Smoker    Packs/day: 1.00    Years: 40.00    Pack years: 40.00    Types: Cigarettes    Quit date: 03/20/1985    Years since  quitting: 35.3  . Smokeless tobacco: Never Used  Substance Use Topics  . Alcohol use: No  . Drug use: No    Allergies  Allergen Reactions  . Lovastatin Swelling    Patient states that her tongue swells and she gets a tingling feeling all over. Patient states that her tongue swells and she gets a tingling feeling all over.  . Lisinopril     Current Meds  Medication Sig  . allopurinol (ZYLOPRIM) 300 MG tablet Take 0.5 tablets (150 mg total) by mouth daily.  [name] atorvastatin (LIPITOR) 10 MG tablet Take 1 tablet (10 mg total) by mouth daily.  . citalopram (CELEXA) 20 MG tablet Take 20 mg by mouth every morning.  . clotrimazole-betamethasone (LOTRISONE) cream Apply 1 application topically 2 (two) times daily.  . fluticasone (FLOVENT HFA) 44 MCG/ACT inhaler Flovent HFA 44 mcg/actuation aerosol inhaler  . gabapentin (NEURONTIN) 400 MG capsule Take 400 mg by mouth 2 (two) times daily.  [name] HYDROcodone-acetaminophen (NORCO/VICODIN) 5-325 MG tablet hydrocodone 5 mg-acetaminophen 325 mg tablet  Take 1 tablet twice a day by oral route as needed.  [name] losartan-hydrochlorothiazide (HYZAAR) 100-25 MG tablet Take 1 tablet by mouth daily.  [name] omeprazole (PRILOSEC) 20 MG capsule Take 30- 60  min before your first and last meals of the day  . PROAIR HFA 108 (90 BASE) MCG/ACT inhaler Inhale 2 puffs into the lungs every 6 (six) hours as needed for wheezing or shortness of breath.   . Tiotropium Bromide-Olodaterol (STIOLTO RESPIMAT) 2.5-2.5 MCG/ACT AERS Inhale 2 puffs into the lungs daily.  [name] zolpidem (AMBIEN) 10 MG tablet Take 1 tablet (10 mg total) by mouth at bedtime as needed for sleep.    BP 134/75   Pulse 93   Ht 5' 5.5" (1.664 m)   Wt 177 lb (80.3 kg)   BMI 29.01 kg/m   Physical Exam  General appearance: Well-developed well-nourished no gross deformities  Cardiovascular normal pulse and perfusion normal color without edema  Neurologically o sensation loss or deficits or pathologic  reflexes  Psychological: Awake alert and oriented x3 mood and affect normal  Skin no lacerations or ulcerations no nodularity no palpable masses, no erythema or nodularity  Musculoskeletal: Left knee small effusion no warmth of the joint flexion is about 95 degrees full extension if not hyperextension no pain with range of motion of the knee but diffuse tenderness     MEDICAL DECISION MAKING  A.  Encounter Diagnoses  Name Primary?  . Acute pain of left knee   . 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 DATA ANALYSED:   IMAGING: Interpretation of images: Internal imaging  Knee x-ray  The endoprosthesis from the revision hip surgery is noted she has a 16 degree valgus alignment to the left knee with a chondrocalcinosis picture lateral  Orders: None  Outside records reviewed: Outside x-rays were reviewed there is a revision prosthesis left and right hip in good position   C. MANAGEMENT   Inject left knee  Procedure note left knee injection   verbal consent was obtained to inject left knee joint  Timeout was completed to confirm the site of injection  The medications used were Celestone 6 mg with LIDOCAINE 1% 2 CC  Anesthesia was provided by ethyl chloride and the skin was prepped with alcohol.  After cleaning the skin with alcohol a 20-gauge needle was used to inject the left knee joint. There were no complications. A sterile bandage was applied.    No orders of the defined types were placed in this encounter.     Fuller Canada, MD  07/15/2020 11:14 AM

## 2020-07-19 ENCOUNTER — Ambulatory Visit (INDEPENDENT_AMBULATORY_CARE_PROVIDER_SITE_OTHER): Payer: Medicare HMO | Admitting: Internal Medicine

## 2020-07-19 ENCOUNTER — Encounter: Payer: Self-pay | Admitting: Internal Medicine

## 2020-07-19 ENCOUNTER — Other Ambulatory Visit: Payer: Self-pay

## 2020-07-19 DIAGNOSIS — J449 Chronic obstructive pulmonary disease, unspecified: Secondary | ICD-10-CM

## 2020-07-19 MED ORDER — BREZTRI AEROSPHERE 160-9-4.8 MCG/ACT IN AERO: 2.0000 | INHALATION_SPRAY | Freq: Two times a day (BID) | RESPIRATORY_TRACT | 0 refills | Status: DC

## 2020-07-19 MED ORDER — BREZTRI AEROSPHERE 160-9-4.8 MCG/ACT IN AERO: 2.0000 | INHALATION_SPRAY | Freq: Two times a day (BID) | RESPIRATORY_TRACT | 11 refills | Status: DC

## 2020-07-19 MED ORDER — BREZTRI AEROSPHERE 160-9-4.8 MCG/ACT IN AERO: 2.0000 | INHALATION_SPRAY | Freq: Two times a day (BID) | RESPIRATORY_TRACT | 3 refills | Status: DC

## 2020-07-19 NOTE — Assessment & Plan Note (Signed)
Onset of sob around 2000 p quits smoking 1987  - 05/25/2020  After extensive coaching inhaler device,  effectiveness =    0-75% with smi > continue stiolto 2 each am  - Allergy profile 05/25/20  >  Eos 0.1 /  IgE  4  - PFT's   06/29/20  FEV1 1.40 (97 % ) ratio 0.68  p 2 % improvement from saba p stiolto prior to study with DLCO  10.28 (58%) corrects to 2.60 (62%)  for alv volume and FV curve mild curvature    - 07/19/2020  After extensive coaching inhaler device,  effectiveness =    75% > try breztri 2bid   ?  Group D in terms of symptom/risk and laba/lama/ICS  therefore appropriate rx at this point >>>  Ok to try breztri but will need to work hard on hfa  Suggested she use her empty stiolto like a golfer warms up to hit a real ball taking some practice inhalations before each breztri rx.    Re saba: I spent extra time with pt today reviewing appropriate use of albuterol for prn use on exertion with the following points: 1) saba is for relief of sob that does not improve by walking a slower pace or resting but rather if the pt does not improve after trying this first. 2) If the pt is convinced, as many are, that saba helps recover from activity faster then it's easy to tell if this is the case by re-challenging : ie stop, take the inhaler, then p 5 minutes try the exact same activity (intensity of workload) that just caused the symptoms and see if they are substantially diminished or not after saba 3) if there is an activity that reproducibly causes the symptoms, try the saba 15 min before the activity on alternate days   If in fact the saba really does help, then fine to continue to use it prn but advised may need to look closer at the maintenance regimen being used to achieve better control of airways disease with exertion.          Each maintenance medication was reviewed in detail including emphasizing most importantly the difference between maintenance and prns and under what circumstances the  prns are to be triggered using an action plan format where appropriate.  Total time for H and P, chart review, counseling, reviewing hfa/smi device(s) and generating customized AVS unique to this office visit / same day charting  > 30 min

## 2020-07-19 NOTE — Patient Instructions (Addendum)
Plan A = Automatic = Always=   Breztri Take 2 puffs first thing in am and then another 2 puffs about 12 hours later.   Work on inhaler technique:  relax and gently blow all the way out then take a nice smooth deep breath back in, triggering the inhaler at same time you start breathing in.  Hold for up to 5 seconds if you can. Blow out thru nose. Rinse and gargle with water when done     Plan B = Backup (to supplement plan A, not to replace it) Only use your albuterol inhaler as a rescue medication to be used if you can't catch your breath by resting or doing a relaxed purse lip breathing pattern.  - The less you use it, the better it will work when you need it. - Ok to use the inhaler up to 2 puffs  every 4 hours if you must but call for appointment if use goes up over your usual need - Don't leave home without it !!  (think of it like the spare tire for your car)    Please schedule a follow up visit in 3 months but call sooner if needed

## 2020-07-19 NOTE — Progress Notes (Signed)
**Note Heather-Identified via Obfuscation** Heather [name], female    DOB: December 07, 1940     MRN: [id number]   Brief patient profile:  17 yobf quit smoking 1987 with dx of early emphysema by cxr with onset of cough/sob in spells starting around 2000 on prn saba then fall 2021 rx stiolto worked fine.       History of Present Illness  05/25/2020  Pulmonary/ 1st office eval/ Heather [name] / [name]  Chief Complaint  Patient presents with  . Pulmonary Consult    Referred by Heather [name].  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 She c/o cough x 6 months. Cough is occ prod with light green to yellow sputum.  She has DOE walking up stairs. She uses proair 4-5 x per wk.   Dyspnea:  [employer] 1-2 aisles x one year  Cough: x 6 mo/ assoc hoarseness  Sleep: bed is flat/ 2 pillows no problem SABA use: poor techniuque/ hoarse worse with dpi  rec Plan A = Automatic = Always=    Stiolto 2 pffs 1st thing each am  Work on inhaler technique: Remember to do the practice breaths first  Plan B = Backup (to supplement plan A, not to replace it) Only use your albuterol inhaler as a rescue medication zpak should turn mucus white Prilosec 20 mg Take 30- 60 min before your first and last meals of the day  Please schedule a follow up office visit in 4 weeks, sooner if needed with pfts     07/19/2020  f/u ov/Heather [name] office/Heather [name] re:  GOLD I  No chief complaint on file. Dyspnea: easier to shop since started stiolto / wants to know if can change to breztri same benefit  Cough: better  Sleeping: bed is flat / 2 pillows  SABA use: rarely  02: none  Covid status: vax x 3  Lung cancer screening: n/a    No obvious day to day or daytime variability or assoc excess/ purulent sputum or mucus plugs or hemoptysis or cp or chest tightness, subjective wheeze or overt sinus or hb symptoms.   Sleeping as above  without nocturnal  or early am exacerbation  of respiratory  c/o's or need for noct saba. Also denies any obvious fluctuation of symptoms with weather or  environmental changes or other aggravating or alleviating factors except as outlined above   No unusual exposure hx or h/o childhood pna/ asthma or knowledge of premature birth.  Current Allergies, Complete Past Medical History, Past Surgical History, Family History, and Social History were reviewed in Heather [name] record.  ROS  The following are not active complaints unless bolded Hoarseness, sore throat, dysphagia, dental problems, itching, sneezing,  nasal congestion or discharge of excess mucus or purulent secretions, ear ache,   fever, chills, sweats, unintended wt loss or wt gain, classically pleuritic or exertional cp,  orthopnea pnd or arm/hand swelling  or leg swelling, presyncope, palpitations, abdominal pain, anorexia, nausea, vomiting, diarrhea  or change in bowel habits or change in bladder habits, change in stools or change in urine, dysuria, hematuria,  rash, arthralgias, visual complaints, headache, numbness, weakness or ataxia or problems with walking or coordination,  change in mood or  memory.        Current Meds  Medication Sig  . allopurinol (ZYLOPRIM) 300 MG tablet Take 0.5 tablets (150 mg total) by mouth daily.  [name] AMLODIPINE-OLMESARTAN PO Take 1 capsule by mouth daily.  [name] atorvastatin (LIPITOR) 10 MG tablet Take 1 tablet (10 mg total) by mouth  daily.  . citalopram (CELEXA) 20 MG tablet Take 20 mg by mouth every morning.  . clotrimazole-betamethasone (LOTRISONE) cream Apply 1 application topically 2 (two) times daily.  . diphenhydrAMINE (BENADRYL) 25 MG tablet Take 25 mg by mouth every 6 (six) hours as needed.  . fluticasone (FLOVENT HFA) 44 MCG/ACT inhaler Flovent HFA 44 mcg/actuation aerosol inhaler  . gabapentin (NEURONTIN) 400 MG capsule Take 400 mg by mouth 2 (two) times daily.  [name] HYDROcodone-acetaminophen (NORCO/VICODIN) 5-325 MG tablet hydrocodone 5 mg-acetaminophen 325 mg tablet  Take 1 tablet twice a day by oral route as needed.  [name] omeprazole  (PRILOSEC) 20 MG capsule Take 30- 60 min before your first and last meals of the day  . PROAIR HFA 108 (90 BASE) MCG/ACT inhaler Inhale 2 puffs into the lungs every 6 (six) hours as needed for wheezing or shortness of breath.   . Tiotropium Bromide-Olodaterol (STIOLTO RESPIMAT) 2.5-2.5 MCG/ACT AERS Inhale 2 puffs into the lungs daily.  [name] zolpidem (AMBIEN) 10 MG tablet Take 1 tablet (10 mg total) by mouth at bedtime as needed for sleep.            Past Medical History:  Diagnosis Date  . Anemia   . Anxiety   . Arthritis   . COPD (chronic obstructive pulmonary disease) (HCC)   . Depression   . Dry eyes 04/14/2014  . GERD (gastroesophageal reflux disease)   . Glaucoma   . Gout   . Gout 10/23/2018  . HTN (hypertension)         Objective:     Wt Readings from Last 3 Encounters:  07/19/20 179 lb 6.4 oz (81.4 kg)  07/15/20 177 lb (80.3 kg)  05/25/20 174 lb (78.9 kg)      Vital signs reviewed  07/19/2020  - Note at rest 02 sats  100% on RA   General appearance:   Ambulatory bf nad     HEENT : pt wearing mask not removed for exam due to covid - 19 concerns.   NECK :  without JVD/Nodes/TM/ nl carotid upstrokes bilaterally   LUNGS: no acc muscle use,  Min barrel  contour chest wall with bilateral  slightly decreased bs s audible wheeze and  without cough on insp or exp maneuvers and min  Hyperresonant  to  percussion bilaterally     CV:  RRR  no s3 or murmur or increase in P2, and lymphedema both LE's with tight elastic hose in place  ABD:  soft and nontender with pos end  insp Hoover's  in the supine position. No bruits or organomegaly appreciated, bowel sounds nl  MS:   Nl gait/  ext warm without deformities, calf tenderness, cyanosis or clubbing No obvious joint restrictions   SKIN: warm and dry without lesions    NEURO:  alert, approp, nl sensorium with  no motor or cerebellar deficits apparent.                Assessment

## 2020-07-27 ENCOUNTER — Other Ambulatory Visit: Payer: Self-pay | Admitting: Internal Medicine

## 2020-07-27 MED ORDER — BREZTRI AEROSPHERE 160-9-4.8 MCG/ACT IN AERO: 2.0000 | INHALATION_SPRAY | Freq: Two times a day (BID) | RESPIRATORY_TRACT | 3 refills | Status: DC

## 2020-08-16 ENCOUNTER — Ambulatory Visit: Payer: Medicare HMO | Admitting: Podiatry

## 2020-08-16 ENCOUNTER — Other Ambulatory Visit: Payer: Self-pay

## 2020-08-16 DIAGNOSIS — L989 Disorder of the skin and subcutaneous tissue, unspecified: Secondary | ICD-10-CM | POA: Diagnosis not present

## 2020-08-16 DIAGNOSIS — M2031 Hallux varus (acquired), right foot: Secondary | ICD-10-CM | POA: Diagnosis not present

## 2020-08-23 NOTE — Progress Notes (Signed)
   HPI: 80 y.o. female presenting today for evaluation of symptomatic calluses that have developed to the patient's right foot.  They're very tender to palpation and they're affecting her ability to walk without pain.  She presents for further treatment and evaluation  Past Medical History:  Diagnosis Date   Anemia    Anxiety    Arthritis    COPD (chronic obstructive pulmonary disease) (HCC)    Depression    Dry eyes 04/14/2014   GERD (gastroesophageal reflux disease)    Glaucoma    Gout    Gout 10/23/2018   HTN (hypertension)      Physical Exam: General: The patient is alert and oriented x3 in no acute distress.  Dermatology: Skin is warm, dry and supple bilateral lower extremities. Negative for open lesions or macerations.  Hyperkeratotic preulcerative callus lesion noted to the lateral aspect of the fifth MTPJ and second digit right foot  Vascular: Palpable pedal pulses bilaterally. No edema or erythema noted. Capillary refill within normal limits.  Neurological: Epicritic and protective threshold grossly intact bilaterally.   Musculoskeletal Exam: History of bunion and hammertoe correction right foot with iatrogenic development of hallux varus right.  Contracture at the level of the IPJ noted to the right hallux with a prominent metatarsal head that appears to be irritated and rubbing with shoe gear   Assessment: 1.  Hallux varus right 2.  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  Preulcerative callus lesion right fifth MTPJ and right second digit  Plan of Care:  1. Patient evaluated.  2.  Continue silicone toe cap and good supportive shoes daily 3.  Excisional debridement of the hyperkeratotic callus lesion was performed to the second digit of the right foot and subfifth MTPJ using a 312 scalpel without incident or bleeding 4.  Corn pads provided to offload pressure from the area 5.  Return to clinic as needed     Felecia Shelling,  DPM [facility]  Dr. Felecia Shelling, DPM    [address] [place], [place] [address]                Office [phone]  Fax [phone]

## 2020-11-01 ENCOUNTER — Encounter: Payer: Self-pay | Admitting: Internal Medicine

## 2020-11-01 ENCOUNTER — Ambulatory Visit (INDEPENDENT_AMBULATORY_CARE_PROVIDER_SITE_OTHER): Payer: Medicare HMO | Admitting: Internal Medicine

## 2020-11-01 ENCOUNTER — Other Ambulatory Visit: Payer: Self-pay

## 2020-11-01 DIAGNOSIS — J449 Chronic obstructive pulmonary disease, unspecified: Secondary | ICD-10-CM | POA: Diagnosis not present

## 2020-11-01 DIAGNOSIS — R0609 Other forms of dyspnea: Secondary | ICD-10-CM

## 2020-11-01 DIAGNOSIS — R0600 Dyspnea, unspecified: Secondary | ICD-10-CM | POA: Diagnosis not present

## 2020-11-01 MED ORDER — FAMOTIDINE 20 MG PO TABS: ORAL_TABLET | ORAL | 11 refills | Status: DC

## 2020-11-01 NOTE — Progress Notes (Signed)
Heather [name], female    DOB: 1940/06/23     MRN: [id number]   Brief patient profile:  77 yobf quit smoking 1987 with dx of early emphysema by cxr with onset of cough/sob in spells starting around 2000 on prn saba then fall 2021 rx stiolto worked fine.       History of Present Illness  05/25/2020  Pulmonary/ 1st office eval/ Margan Elias / [facility]  Chief Complaint  Patient presents with   Pulmonary Consult    Referred by Dr. Bertram Savin.  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 She c/o cough x 6 months. Cough is occ prod with light green to yellow sputum.  She has DOE walking up stairs. She uses proair 4-5 x per wk.   Dyspnea:  [employer] 1-2 aisles x one year  Cough: x 6 mo/ assoc hoarseness  Sleep: bed is flat/ 2 pillows no problem SABA use: poor techniuque/ hoarse worse with dpi  rec Plan A = Automatic = Always=    Stiolto 2 pffs 1st thing each am  Work on inhaler technique: Remember to do the practice breaths first  Plan B = Backup (to supplement plan A, not to replace it) Only use your albuterol inhaler as a rescue medication zpak should turn mucus white Prilosec 20 mg Take 30- 60 min before your first and last meals of the day  Please schedule a follow up office visit in 4 weeks, sooner if needed with pfts     07/19/2020  f/u ov/[facility] office/Bao Coreas re:  GOLD I  No chief complaint on file. Dyspnea: easier to shop since started stiolto / wants to know if can change to breztri same benefit  Cough: better  Sleeping: bed is flat / 2 pillows  SABA use: rarely  02: none  Covid status: vax x 3  Lung cancer screening: n/a  Rec Plan A = Automatic = Always=   Breztri Take 2 puffs first thing in am and then another 2 puffs about 12 hours later.  Work on inhaler technique:    Plan B = Backup (to supplement plan A, not to replace it) Only use your albuterol inhaler as a rescue medication    11/01/2020  f/u ov/[name] office/Konnor Vondrasek re: GOLD I copd  maint on breztri   No chief  complaint on file.  Dyspnea:  [employer] ok / uses [facility] parking due to breathing and arthritis back and hips  Cough: mostly noct / non productive Sleeping: 2 pillows  / no bed blocks yet SABA use: once a week noct, never daytime  02: none Covid status: x 4  Lung cancer screening: n/a    No obvious day to day or daytime variability or assoc excess/ purulent sputum or mucus plugs or hemoptysis or cp or chest tightness, subjective wheeze or overt sinus or hb symptoms.     Also denies any obvious fluctuation of symptoms with weather or environmental changes or other aggravating or alleviating factors except as outlined above   No unusual exposure hx or h/o childhood pna/ asthma or knowledge of premature birth.  Current Allergies, Complete Past Medical History, Past Surgical History, Family History, and Social History were reviewed in [name].  ROS  The following are not active complaints unless bolded Hoarseness, sore throat, dysphagia, dental problems, itching, sneezing,  nasal congestion or discharge of excess mucus or purulent secretions, ear ache,   fever, chills, sweats, unintended wt loss or wt gain, classically pleuritic or exertional cp,  orthopnea pnd or arm/hand swelling  or leg swelling, presyncope, palpitations, abdominal pain, anorexia, nausea, vomiting, diarrhea  or change in bowel habits or change in bladder habits, change in stools or change in urine, dysuria, hematuria,  rash, arthralgias, visual complaints, headache, numbness, weakness or ataxia or problems with walking or coordination,  change in mood or  memory.        Current Meds  Medication Sig   allopurinol (ZYLOPRIM) 300 MG tablet Take 0.5 tablets (150 mg total) by mouth daily.   amlodipine-olmesartan (AZOR) 10-20 MG tablet Take 1 tablet by mouth daily.   atorvastatin (LIPITOR) 10 MG tablet Take 1 tablet (10 mg total) by mouth daily.   Budeson-Glycopyrrol-Formoterol (BREZTRI AEROSPHERE)  160-9-4.8 MCG/ACT AERO Inhale 2 puffs into the lungs 2 (two) times daily.   Cholecalciferol (DIALYVITE VITAMIN D 5000 PO) Take by mouth.   citalopram (CELEXA) 20 MG tablet Take 20 mg by mouth every morning.   DULoxetine (CYMBALTA) 60 MG capsule Take 60 mg by mouth daily.   famotidine (PEPCID) 20 MG tablet One after supper   hydrochlorothiazide (MICROZIDE) 12.5 MG capsule Take 12.5 mg by mouth daily.   HYDROcodone-acetaminophen (NORCO/VICODIN) 5-325 MG tablet hydrocodone 5 mg-acetaminophen 325 mg tablet  Take 1 tablet twice a day by oral route as needed.   omeprazole (PRILOSEC) 20 MG capsule Take 30- 60 min before your first and last meals of the day   PROAIR HFA 108 (90 BASE) MCG/ACT inhaler Inhale 2 puffs into the lungs every 6 (six) hours as needed for wheezing or shortness of breath.                 Past Medical History:  Diagnosis Date   Anemia    Anxiety    Arthritis    COPD (chronic obstructive pulmonary disease) (HCC)    Depression    Dry eyes 04/14/2014   GERD (gastroesophageal reflux disease)    Glaucoma    Gout    Gout 10/23/2018   HTN (hypertension)         Objective:     11/01/2020        170    07/19/20 179 lb 6.4 oz (81.4 kg)  07/15/20 177 lb (80.3 kg)  05/25/20 174 lb (78.9 kg)    Vital signs reviewed  11/01/2020  - Note at rest 02 sats  99% on RA   General appearance:    pleasant amb bf nad     HEENT : pt wearing mask not removed for exam due to covid - 19 concerns.   NECK :  without JVD/Nodes/TM/ nl carotid upstrokes bilaterally   LUNGS: no acc muscle use,  Min barrel  contour chest wall with bilateral  slightly decreased bs s audible wheeze and  without cough on insp or exp maneuvers and min  Hyperresonant  to  percussion bilaterally     CV:  RRR  no s3 or murmur or increase in P2, and elastic hose, trace edema sym bilaterally LE   ABD:  soft and nontender with pos end  insp Hoover's  in the supine position. No bruits or organomegaly  appreciated, bowel sounds nl  MS:   Nl gait/  ext warm without deformities, calf tenderness, cyanosis or clubbing No obvious joint restrictions   SKIN: warm and dry without lesions    NEURO:  alert, approp, nl sensorium with  no motor or cerebellar deficits apparent.  Assessment

## 2020-11-01 NOTE — Patient Instructions (Addendum)
Add pepcid 20 mg after supper and start using bed blocks   If still coughing at night >  add CHLORPHENIRAMINE  4 mg  (Chlortab 4mg   at should be easiest to find in the green box)  take one -two  every 4 hours as needed at  bedtime   GERD (REFLUX)  is an extremely common cause of respiratory symptoms just like yours , many times with no obvious heartburn at all.    It can be treated with medication, but also with lifestyle changes including elevation of the head of your bed (ideally with 6 -8inch blocks under the headboard of your bed),  Smoking cessation, avoidance of late meals, excessive alcohol, and avoid fatty foods, chocolate, peppermint, colas, red wine, and acidic juices such as orange juice.  NO MINT OR MENTHOL PRODUCTS SO NO COUGH DROPS  USE SUGARLESS CANDY INSTEAD (Jolley ranchers or Stover's or Life Savers) or even ice chips will also do - the key is to swallow to prevent all throat clearing. NO OIL BASED VITAMINS - use powdered substitutes.  Avoid fish oil when coughing.    Please schedule a follow up visit in 3 months but call sooner if needed  with all medications /inhalers/ solutions in hand so we can verify exactly what you are taking. This includes all medications from all doctors and over the counters

## 2020-11-01 NOTE — Assessment & Plan Note (Signed)
Onset of sob around 2000 p quits smoking 1987  - 05/25/2020  After extensive coaching inhaler device,  effectiveness =    0-75% with smi > continue stiolto 2 each am  - Allergy profile 05/25/20  >  Eos 0.1 /  IgE  4  - PFT's   06/29/20  FEV1 1.40 (97 % ) ratio 0.68  p 2 % improvement from saba p stiolto prior to study with DLCO  10.28 (58%) corrects to 2.60 (62%)  for alv volume and FV curve mild curvature    - 07/19/2020    try breztri 2bid  - 11/01/2020  After extensive coaching inhaler device,  effectiveness =    90% continue breztri, add h2 h1 hs   Hard to believe noct cough is copd related given min daytime issues and good hfa so rec rx as Upper airway cough syndrome (previously labeled PNDS),  is so named because it's frequently impossible to sort out how much is  CR/sinusitis with freq throat clearing (which can be related to primary GERD)   vs  causing  secondary (" extra esophageal")  GERD from wide swings in gastric pressure that occur with throat clearing, often  promoting self use of mint and menthol lozenges that reduce the lower esophageal sphincter tone and exacerbate the problem further in a cyclical fashion.   These are the same pts (now being labeled as having "irritable larynx syndrome" by some cough centers) who not infrequently have a history of having failed to tolerate ace inhibitors,  dry powder inhalers or biphosphonates or report having atypical/extraesophageal reflux symptoms that don't respond to standard doses of PPI  and are easily confused as having aecopd or asthma flares by even experienced allergists/ pulmonologists (myself included).   >>> add hs h2 then h1 if needed plus bed blocks/ diet  >>> f/u in 3 m          Each maintenance medication was reviewed in detail including emphasizing most importantly the difference between maintenance and prns and under what circumstances the prns are to be triggered using an action plan format where appropriate.  Total time for H  and P, chart review, counseling, reviewing  hfa device(s) and generating customized AVS unique to this office visit / same day charting > 30 min

## 2020-12-07 ENCOUNTER — Encounter: Payer: Self-pay | Admitting: Pulmonary Disease

## 2020-12-07 ENCOUNTER — Ambulatory Visit: Payer: Medicare HMO | Admitting: Pulmonary Disease

## 2020-12-07 ENCOUNTER — Other Ambulatory Visit: Payer: Self-pay

## 2020-12-07 VITALS — BP 130/68 | HR 78 | Temp 98.2°F | Ht 62.5 in | Wt 171.0 lb

## 2020-12-07 DIAGNOSIS — F5101 Primary insomnia: Secondary | ICD-10-CM

## 2020-12-07 DIAGNOSIS — J449 Chronic obstructive pulmonary disease, unspecified: Secondary | ICD-10-CM | POA: Diagnosis not present

## 2020-12-07 MED ORDER — ZOLPIDEM TARTRATE 10 MG PO TABS: 10.0000 mg | ORAL_TABLET | Freq: Every evening | ORAL | 5 refills | Status: DC | PRN

## 2020-12-07 NOTE — Patient Instructions (Signed)
Will arrange for overnight oxygen test on room air  Follow up in 3 to 4 months

## 2020-12-07 NOTE — Progress Notes (Signed)
[facility]  Chief Complaint  Patient presents with   Consult    PCP referred for potential sleep apnea. Pt has trouble sleeping. Has been taking ambien but pcp is concerned about what is causing trouble sleep. Pt says she may snore but is unsure.     Past Surgical History:  She  has a past surgical history that includes Abdominal hysterectomy; Foot surgery (Left); Bunionectomy (Bilateral); Back surgery; Shoulder hemi-arthroplasty (Left, 03/25/2014); Shoulder arthroscopy (Right); Knee surgery; Joint replacement (Bilateral); and Joint replacement (Right, 2010 or 2012).  Past Medical History:  Anemia, Anxiety, OA, COPD, Depression, GERD, Glaucoma, Gout, HTN, Tremor, Back pain  Constitutional:  BP 130/68 (BP Location: Left Arm, Patient Position: Sitting)   Pulse 78   Temp 98.2 F (36.8 C) (Temporal)   Ht 5' 2.5" (1.588 m)   Wt 171 lb 0.6 oz (77.6 kg)   SpO2 100% Comment: ra  BMI 30.79 kg/m   Brief Summary:  Heather [name] is a 80 y.o. female former smoker with insomnia and COPD.      Subjective:   Previously seen by Dr. Sherene Sires for COPD.  Breztri helps.  She has used [place] for years.  She tries to sleep without it, but can't.  She is using this every night.   She goes to sleep at 10 pm.  It can take an hour to fall asleep if she doesn't use ambien.  She will finally get fed up, and then take ambien.  She then falls asleep.  She wakes up 1 or 2 times to use the bathroom.  She wakes up sometimes hearing herself snore.  She gets out of bed between 8 and 9 am.  She feels okay in the morning.  She drinks two 50/50 cups of coffee.  She will nap sometimes during the day.  She denies sleep walking, sleep talking, bruxism, or nightmares.  There is no history of restless legs.  She denies sleep hallucinations, sleep paralysis, or cataplexy.  The Epworth score is 5 out of 24.   Physical Exam:   Appearance - well kempt   ENMT - no sinus  tenderness, no oral exudate, no LAN, Mallampati 4 airway, no stridor, scalloped tongue, wears dentures  Respiratory - equal breath sounds bilaterally, no wheezing or rales  CV - s1s2 regular rate and rhythm, no murmurs  Ext - no clubbing, no edema  Skin - no rashes  Psych - normal mood and affect   Pulmonary testing:  PFT 05/25/20 >> FEV1 1.40 (97%), FEV1% 68, TLC 6.52 ( 137%), RV 4.28 (190%), DLCO 58%  Sleep Tests:    Cardiac Tests:  Echo 07/29/15 >> EF 55 to 60%, mild LVH  Social History:  She  reports that she quit smoking about 35 years ago. Her smoking use included cigarettes. She has a 40.00 pack-year smoking history. She has never used smokeless tobacco. She reports that she does not drink alcohol and does not use drugs.  Family History:  Her family history includes Aneurysm in her father; Congestive Heart Failure in her niece; Diabetes in her brother; High blood pressure in her mother; Hypertension in her niece; Pancreatitis in her brother.     Assessment/Plan:   Insomnia. - she has been on [place] for years, and don't think this can be abruptly stopped; will refill this for now - reviewed proper sleep hygiene, stimulus control and relaxation techniques - will arrange for overnight oximetry on room air, and then determine if  sh needs to get set up with a home sleep study to assess for sleep apnea  COPD. - continue breztri - prn albuterol  Time Spent Involved in Patient Care on Day of Examination:  47 minutes  Follow up:   Patient Instructions  Will arrange for overnight oxygen test on room air  Follow up in 3 to 4 months  Medication List:   Allergies as of 12/07/2020       Reactions   Lovastatin Swelling   Patient states that her tongue swells and she gets a tingling feeling all over. Patient states that her tongue swells and she gets a tingling feeling all over.   Lisinopril         Medication List        Accurate as of December 07, 2020 11:21 AM.  If you have any questions, ask your nurse or doctor.          allopurinol 300 MG tablet Commonly known as: ZYLOPRIM Take 0.5 tablets (150 mg total) by mouth daily.   amlodipine-olmesartan 10-20 MG tablet Commonly known as: AZOR Take 1 tablet by mouth daily.   atorvastatin 10 MG tablet Commonly known as: LIPITOR Take 1 tablet (10 mg total) by mouth daily.   Breztri Aerosphere 160-9-4.8 MCG/ACT Aero Generic drug: Budeson-Glycopyrrol-Formoterol Inhale 2 puffs into the lungs 2 (two) times daily.   citalopram 20 MG tablet Commonly known as: CELEXA Take 20 mg by mouth every morning.   DIALYVITE VITAMIN D 5000 PO Take by mouth.   DULoxetine 60 MG capsule Commonly known as: CYMBALTA Take 60 mg by mouth daily.   famotidine 20 MG tablet Commonly known as: Pepcid One after supper   hydrochlorothiazide 12.5 MG capsule Commonly known as: MICROZIDE Take 12.5 mg by mouth daily.   HYDROcodone-acetaminophen 5-325 MG tablet Commonly known as: NORCO/VICODIN hydrocodone 5 mg-acetaminophen 325 mg tablet  Take 1 tablet twice a day by oral route as needed.   omeprazole 20 MG capsule Commonly known as: PRILOSEC Take 30- 60 min before your first and last meals of the day   ProAir HFA 108 (90 Base) MCG/ACT inhaler Generic drug: albuterol Inhale 2 puffs into the lungs every 6 (six) hours as needed for wheezing or shortness of breath.   venlafaxine 37.5 MG tablet Commonly known as: EFFEXOR Take 37.5 mg by mouth 2 (two) times daily.   zolpidem 10 MG tablet Commonly known as: AMBIEN Take 1 tablet (10 mg total) by mouth at bedtime as needed for sleep. Started by: Coralyn Helling, MD        Signature:  Coralyn Helling, MD [facility] Pulmonary/Critical Care Pager - [phone] 12/07/2020, 11:21 AM

## 2020-12-08 ENCOUNTER — Encounter: Payer: Self-pay | Admitting: Podiatry

## 2020-12-08 ENCOUNTER — Ambulatory Visit: Payer: Medicare HMO | Admitting: Podiatry

## 2020-12-08 DIAGNOSIS — B353 Tinea pedis: Secondary | ICD-10-CM

## 2020-12-08 MED ORDER — KETOCONAZOLE 2 % EX CREA: 1.0000 "application " | TOPICAL_CREAM | Freq: Every day | CUTANEOUS | 2 refills | Status: DC

## 2020-12-08 NOTE — Progress Notes (Signed)
  Subjective:  Patient ID: Heather [name], female    DOB: 04-May-1940,   MRN: [id number]  Chief Complaint  Patient presents with   Rash    Right foot rash.     80 y.o. female presents for right foot rash that has been present for several days. Relates she was trying an ointment from [place]. States the cream helped with the dry cracking but then started to be painful. Relates she has itching on the top and bottom of her foot.  . Denies any other pedal complaints. Denies n/v/f/c.   Past Medical History:  Diagnosis Date   Anemia    Anxiety    Arthritis    COPD (chronic obstructive pulmonary disease) (HCC)    Depression    Dry eyes 04/14/2014   GERD (gastroesophageal reflux disease)    Glaucoma    Gout    Gout 10/23/2018   HTN (hypertension)     Objective:  Physical Exam: Vascular: DP/PT pulses 2/4 bilateral. CFT <3 seconds. Normal hair growth on digits. No edema.  Skin. No lacerations or abrasions bilateral feet. Scaling and erythema noted to plantar right foot and dorsal 2-4 toes and in second and third webspace.  Musculoskeletal: MMT 5/5 bilateral lower extremities in DF, PF, Inversion and Eversion. Deceased ROM in DF of ankle joint. Tender to palpation of plantar foot. Hammered digits 2-5 and HAV deformity noted.  Neurological: Sensation intact to light touch.   Assessment:   1. Tinea pedis of right foot      Plan:  Patient was evaluated and treated and all questions answered. -Examined patient -Discussed treatment options for tinea pedis.  -Advised patient to use betadine on the areas of concern for the next three days Following this may start using ketoconazole on the areas of concern.  -Prescription for ketoconazole.  -Patient to return in 4 weeks for follow up evaluation and discussion of fungal culture results or sooner if symptoms worsen.   Louann Sjogren, DPM

## 2020-12-09 ENCOUNTER — Telehealth: Payer: Self-pay | Admitting: *Deleted

## 2020-12-09 NOTE — Telephone Encounter (Signed)
Patient is calling for status of a medication that was supposed to be sent to pharmacy,Ketoconazole -2% cream. Please advise.

## 2020-12-10 ENCOUNTER — Other Ambulatory Visit: Payer: Self-pay | Admitting: Podiatry

## 2020-12-10 NOTE — Telephone Encounter (Signed)
Yes,is were she is requesting that it be sent, called them and they didn't receive,it's showing in epic but not at the pharmacy.

## 2020-12-10 NOTE — Telephone Encounter (Signed)
Patient is calling because the prescription has not been sent to pharmacy(called and not there) as of yet. Please resend,

## 2020-12-13 ENCOUNTER — Emergency Department (HOSPITAL_COMMUNITY): Payer: Medicare HMO

## 2020-12-13 ENCOUNTER — Other Ambulatory Visit: Payer: Self-pay

## 2020-12-13 ENCOUNTER — Observation Stay (HOSPITAL_COMMUNITY)
Admission: EM | Admit: 2020-12-13 | Discharge: 2020-12-15 | Disposition: A | Discharge status: Home / Self Care | Payer: Medicare HMO | Attending: Family Medicine | Admitting: Family Medicine

## 2020-12-13 ENCOUNTER — Encounter (HOSPITAL_COMMUNITY): Payer: Self-pay | Admitting: Emergency Medicine

## 2020-12-13 DIAGNOSIS — Z96612 Presence of left artificial shoulder joint: Secondary | ICD-10-CM | POA: Diagnosis not present

## 2020-12-13 DIAGNOSIS — R7303 Prediabetes: Secondary | ICD-10-CM | POA: Insufficient documentation

## 2020-12-13 DIAGNOSIS — Z96651 Presence of right artificial knee joint: Secondary | ICD-10-CM | POA: Diagnosis not present

## 2020-12-13 DIAGNOSIS — Z96643 Presence of artificial hip joint, bilateral: Secondary | ICD-10-CM | POA: Insufficient documentation

## 2020-12-13 DIAGNOSIS — N1831 Chronic kidney disease, stage 3a: Secondary | ICD-10-CM | POA: Diagnosis not present

## 2020-12-13 DIAGNOSIS — Z20822 Contact with and (suspected) exposure to covid-19: Secondary | ICD-10-CM | POA: Diagnosis not present

## 2020-12-13 DIAGNOSIS — N179 Acute kidney failure, unspecified: Principal | ICD-10-CM | POA: Diagnosis present

## 2020-12-13 DIAGNOSIS — R4182 Altered mental status, unspecified: Secondary | ICD-10-CM | POA: Insufficient documentation

## 2020-12-13 DIAGNOSIS — J449 Chronic obstructive pulmonary disease, unspecified: Secondary | ICD-10-CM | POA: Insufficient documentation

## 2020-12-13 DIAGNOSIS — Z79899 Other long term (current) drug therapy: Secondary | ICD-10-CM | POA: Insufficient documentation

## 2020-12-13 DIAGNOSIS — Z87891 Personal history of nicotine dependence: Secondary | ICD-10-CM | POA: Insufficient documentation

## 2020-12-13 DIAGNOSIS — N390 Urinary tract infection, site not specified: Secondary | ICD-10-CM

## 2020-12-13 DIAGNOSIS — I129 Hypertensive chronic kidney disease with stage 1 through stage 4 chronic kidney disease, or unspecified chronic kidney disease: Secondary | ICD-10-CM | POA: Insufficient documentation

## 2020-12-13 DIAGNOSIS — I959 Hypotension, unspecified: Secondary | ICD-10-CM | POA: Diagnosis present

## 2020-12-13 LAB — CBC
HCT: 37.2 % (ref 36.0–46.0)
Hemoglobin: 12.1 g/dL (ref 12.0–15.0)
MCH: 32 pg (ref 26.0–34.0)
MCHC: 32.5 g/dL (ref 30.0–36.0)
MCV: 98.4 fL (ref 80.0–100.0)
Platelets: 163 10*3/uL (ref 150–400)
RBC: 3.78 MIL/uL — ABNORMAL LOW (ref 3.87–5.11)
RDW: 14.2 % (ref 11.5–15.5)
WBC: 13.5 10*3/uL — ABNORMAL HIGH (ref 4.0–10.5)
nRBC: 0 % (ref 0.0–0.2)

## 2020-12-13 LAB — LACTIC ACID, PLASMA: Lactic Acid, Venous: 1.9 mmol/L (ref 0.5–1.9)

## 2020-12-13 MED ORDER — KETOCONAZOLE 2 % EX CREA: 1.0000 "application " | TOPICAL_CREAM | Freq: Every day | CUTANEOUS | 2 refills | Status: DC

## 2020-12-13 NOTE — ED Provider Notes (Signed)
[facility] EMERGENCY DEPARTMENT Provider Note   CSN: [id number] Arrival date & time: 12/13/20  1918     History Chief Complaint  Patient presents with   Hypotension    Heather [name] is a 80 y.o. female.  Patient presents to the emergency department for evaluation of low blood pressure.  Patient reports that she checked her blood pressure tonight and it was 98/57.  She reports that she checks her blood pressure twice a day normally.  This is low for her.  Patient reports that she did not feel well for a long period of time yesterday.  She felt generalized weakness to the point where she had difficulty getting out of bed.  When this was occurring she is reports slurred speech.  She she had some mild confusion during that time.  This lasted for 6 or 7 hours.  She did report that her teeth were chattering and she was shaking when this occurred.  She did take her oral temperature and it was 99.1.  Patient reports that she feels better today.  It was the low blood pressure that brought her in tonight.      Past Medical History:  Diagnosis Date   Anemia    Anxiety    Arthritis    COPD (chronic obstructive pulmonary disease) (HCC)    Depression    Dry eyes 04/14/2014   GERD (gastroesophageal reflux disease)    Glaucoma    Gout    Gout 10/23/2018   HTN (hypertension)     Patient Active Problem List   Diagnosis Date Noted   Tremor 06/21/2020   DOE (dyspnea on exertion) 05/25/2020   Stage 3a chronic kidney disease (HCC) 04/09/2020   Leg edema 02/03/2020   AKI (acute kidney injury) (HCC) 01/16/2020   Prediabetes 01/16/2020   Encounter to establish care 01/13/2020   Hyperlipidemia 01/13/2020   Anxiety 01/13/2020   COPD GOLD 1  01/13/2020   Gastroesophageal reflux disease 01/13/2020   Intertrigo 01/13/2020   Long-term current use of opiate analgesic 07/07/2019   Sacroiliac joint pain 07/07/2019   Bilateral carpal tunnel syndrome 05/10/2019   Chronic low back pain 05/10/2019    Lumbosacral radiculopathy 05/10/2019   Polyarthropathy 05/10/2019   Gout 10/24/2018   Essential hypertension 04/17/2014   Renal insufficiency 04/01/2014   Rotator cuff arthropathy     Past Surgical History:  Procedure Laterality Date   ABDOMINAL HYSTERECTOMY     BACK SURGERY     lumbar-disc   BUNIONECTOMY Bilateral    FOOT SURGERY Left    repair of "kissing Cousins"   JOINT REPLACEMENT Bilateral    hip    JOINT REPLACEMENT Right 2010 or 2012   knee   KNEE SURGERY     SHOULDER ARTHROSCOPY Right    shoulder surgery in [place] about 2000   SHOULDER HEMI-ARTHROPLASTY Left 03/25/2014   Procedure: LEFT SHOULDER HEMI-ARTHROPLASTY;  Surgeon: Vickki Hearing, MD;  Location: [facility];  Service: Orthopedics;  Laterality: Left;     OB History   No obstetric history on file.     Family History  Problem Relation Age of Onset   High blood pressure Mother    Aneurysm Father    Diabetes Brother    Pancreatitis Brother    Hypertension Niece    Congestive Heart Failure Niece     Social History   Tobacco Use   Smoking status: Former    Packs/day: 1.00    Years: 40.00    Pack years: 40.00  Types: Cigarettes    Quit date: 03/20/1985    Years since quitting: 35.7   Smokeless tobacco: Never  Substance Use Topics   Alcohol use: No   Drug use: No    Home Medications Prior to Admission medications   Medication Sig Start Date End Date Taking? Authorizing Provider  allopurinol (ZYLOPRIM) 300 MG tablet Take 0.5 tablets (150 mg total) by mouth daily. 02/25/20   Anabel Halon, MD  amlodipine-olmesartan (AZOR) 10-20 MG tablet Take 1 tablet by mouth daily.    [provider]  atorvastatin (LIPITOR) 10 MG tablet Take 1 tablet (10 mg total) by mouth daily. 01/26/20   Anabel Halon, MD  Budeson-Glycopyrrol-Formoterol (BREZTRI AEROSPHERE) 160-9-4.8 MCG/ACT AERO Inhale 2 puffs into the lungs 2 (two) times daily. 07/27/20   Nyoka Cowden, MD  Cholecalciferol (DIALYVITE  VITAMIN D 5000 PO) Take by mouth.    [provider]  citalopram (CELEXA) 20 MG tablet Take 20 mg by mouth every morning.    [provider]  DULoxetine (CYMBALTA) 60 MG capsule Take 60 mg by mouth daily.    [provider]  famotidine (PEPCID) 20 MG tablet One after supper 11/01/20   Nyoka Cowden, MD  hydrochlorothiazide (MICROZIDE) 12.5 MG capsule Take 12.5 mg by mouth daily.    [provider]  HYDROcodone-acetaminophen (NORCO/VICODIN) 5-325 MG tablet hydrocodone 5 mg-acetaminophen 325 mg tablet  Take 1 tablet twice a day by oral route as needed.    [provider]  ketoconazole (NIZORAL) 2 % cream Apply 1 application topically daily. 12/13/20   Louann Sjogren, MD  NON [facility]  Anti-fungal (nail)-#1 Dr Arne Cleveland    [provider]  omeprazole (PRILOSEC) 20 MG capsule Take 30- 60 min before your first and last meals of the day 05/25/20   Nyoka Cowden, MD  PROAIR HFA 108 (90 BASE) MCG/ACT inhaler Inhale 2 puffs into the lungs every 6 (six) hours as needed for wheezing or shortness of breath.  02/15/14   [provider]  venlafaxine (EFFEXOR) 37.5 MG tablet Take 37.5 mg by mouth 2 (two) times daily.    [provider]  zolpidem (AMBIEN) 10 MG tablet Take 1 tablet (10 mg total) by mouth at bedtime as needed for sleep. 12/07/20 01/06/21  Coralyn Helling, MD    Allergies    Lovastatin and Lisinopril  Review of Systems   Review of Systems  Constitutional:  Negative for fever.  Neurological:  Positive for speech difficulty and weakness.  Psychiatric/Behavioral:  Positive for confusion.    Physical Exam Updated Vital Signs BP 131/62   Pulse 79   Temp 99.2 F (37.3 C)   Resp 20   Ht 5' 2.5" (1.588 m)   Wt 73 kg   SpO2 94%   BMI 28.98 kg/m   Physical Exam Vitals and nursing note reviewed.  Constitutional:      General: She is not in acute distress.    Appearance: Normal appearance. She  is well-developed.  HENT:     Head: Normocephalic and atraumatic.     Right Ear: Hearing normal.     Left Ear: Hearing normal.     Nose: Nose normal.  Eyes:     Conjunctiva/sclera: Conjunctivae normal.     Pupils: Pupils are equal, round, and reactive to light.  Cardiovascular:     Rate and Rhythm: Regular rhythm.     Heart sounds: S1 normal and S2 normal. No murmur heard.   No friction  rub. No gallop.  Pulmonary:     Effort: Pulmonary effort is normal. No respiratory distress.     Breath sounds: Normal breath sounds.  Chest:     Chest wall: No tenderness.  Abdominal:     General: Bowel sounds are normal.     Palpations: Abdomen is soft.     Tenderness: There is no abdominal tenderness. There is no guarding or rebound. Negative signs include Murphy's sign and McBurney's sign.     Hernia: No hernia is present.  Musculoskeletal:        General: Normal range of motion.     Cervical back: Normal range of motion and neck supple.  Skin:    General: Skin is warm and dry.     Findings: No rash.  Neurological:     Mental Status: She is alert and oriented to person, place, and time.     GCS: GCS eye subscore is 4. GCS verbal subscore is 5. GCS motor subscore is 6.     Cranial Nerves: No cranial nerve deficit.     Sensory: No sensory deficit.     Coordination: Coordination normal.  Psychiatric:        Speech: Speech normal.        Behavior: Behavior normal.        Thought Content: Thought content normal.    ED Results / Procedures / Treatments   Labs (all labs ordered are listed, but only abnormal results are displayed) Labs Reviewed  BASIC METABOLIC PANEL - Abnormal; Notable for the following components:      Result Value   BUN 37 (*)    Creatinine, Ser 1.56 (*)    GFR, Estimated 34 (*)    All other components within normal limits  CBC - Abnormal; Notable for the following components:   WBC 13.5 (*)    RBC 3.78 (*)    All other components within normal limits  URINALYSIS,  ROUTINE W REFLEX MICROSCOPIC - Abnormal; Notable for the following components:   APPearance CLOUDY (*)    Hgb urine dipstick SMALL (*)    Protein, ur 30 (*)    Leukocytes,Ua LARGE (*)    WBC, UA >50 (*)    Bacteria, UA RARE (*)    All other components within normal limits  DIFFERENTIAL - Abnormal; Notable for the following components:   Neutro Abs 9.7 (*)    Monocytes Absolute 1.3 (*)    Eosinophils Absolute 0.7 (*)    All other components within normal limits  TROPONIN I (HIGH SENSITIVITY) - Abnormal; Notable for the following components:   Troponin I (High Sensitivity) 65 (*)    All other components within normal limits  URINE CULTURE  CULTURE, BLOOD (ROUTINE X 2)  CULTURE, BLOOD (ROUTINE X 2)  RESP PANEL BY RT-PCR (FLU A&B, COVID) ARPGX2  HEPATIC FUNCTION PANEL  LACTIC ACID, PLASMA  TROPONIN I (HIGH SENSITIVITY)    EKG EKG Interpretation  Date/Time:  Monday December 13 2020 21:24:31 EDT Ventricular Rate:  69 PR Interval:  180 QRS Duration: 138 QT Interval:  441 QTC Calculation: 473 R Axis:   -58 Text Interpretation: Sinus rhythm Probable left atrial enlargement LVH with IVCD, LAD and secondary repol abnrm Confirmed by Gilda Crease [phone]) on 12/13/2020 11:00:18 PM  Radiology CT HEAD WO CONTRAST ( )  Result Date: 12/14/2020 CLINICAL DATA:  Altered mental status, slurred speech, hypotensive episode EXAM: CT HEAD WITHOUT CONTRAST TECHNIQUE: Contiguous axial images were obtained from the base of the skull through  the vertex without intravenous contrast. COMPARISON:  None. FINDINGS: Brain: Normal anatomic configuration. Parenchymal volume is relatively well preserved for the patient's age. Bifrontal subcortical white matter infarcts are noted bilaterally, age indeterminate. No abnormal intra or extra-axial mass lesion or fluid collection. No abnormal mass effect or midline shift. No evidence of acute intracranial hemorrhage. Ventricular size is normal. Cerebellum  unremarkable. Vascular: No asymmetric hyperdense vasculature at the skull base. Skull: Intact Sinuses/Orbits: Paranasal sinuses are clear. Orbits are unremarkable. Other: Mastoid air cells and middle ear cavities are clear. IMPRESSION: Small bifrontal subcortical white matter infarcts, age indeterminate. Correlation with patient's neurologic examination is recommended. MRI examination may be more helpful for further evaluation if there are concordant clinical symptoms. Electronically Signed   By: Helyn Numbers M.D.   On: 12/14/2020 00:17   DG Chest Port 1 View  Result Date: 12/13/2020 CLINICAL DATA:  Weakness today. Hypotensive. Episode yesterday with slurred speech, weakness, and confusion. EXAM: PORTABLE CHEST 1 VIEW COMPARISON:  05/25/2020 FINDINGS: Shallow inspiration with atelectasis in the lung bases. No airspace disease or consolidation is suggested. No pleural effusions. No pneumothorax. Mediastinal contours appear intact. Heart size is normal. Calcification of the aorta. Postoperative changes in both shoulders. IMPRESSION: Shallow inspiration with linear atelectasis in the lung bases. No active pulmonary disease. Electronically Signed   By: Burman Nieves M.D.   On: 12/13/2020 23:27    Procedures Procedures   Medications Ordered in ED Medications  cefTRIAXone (ROCEPHIN) 2 g in sodium chloride 0.9 % 100 mL IVPB (has no administration in time range)    ED Course  I have reviewed the triage vital signs and the nursing notes.  Pertinent labs & imaging results that were available during my care of the patient were reviewed by me and considered in my medical decision making (see chart for details).    MDM Rules/Calculators/A&P                           Patient presents to the emergency department for evaluation of multiple problems.  Patient reports that she had a long period of the day yesterday where she had slurred speech.  She was alone, cannot tell me if she had facial asymmetry.   She did not notice any unilateral extremity weakness, numbness or tingling.  The symptoms seem to have resolved.  She does not have any focal findings on neurologic exam currently.  Patient reports that there was an episode of what sounds like shaking chills and teeth chattering during this period of time.  She did not, however, have a fever, as she did take an oral temperature.  She does have an elevated white blood cell count.  Lactic acid is not elevated.  Patient did not have hypotension, tachycardia or tachypnea.  No signs of severe sepsis at this point.  Cultures obtained.  She does have a urinary tract infection that might explain some of her symptoms.  Treated with Rocephin.  Patient's head CT, however, does have bifrontal abnormalities that need further work-up to ensure that her symptoms yesterday were not acute stroke.  As symptoms are resolved and she is out of any window for treatment, does not require emergent transfer for MRI, can be admitted for further work-up.  Final Clinical Impression(s) / ED Diagnoses Final diagnoses:  Urinary tract infection without hematuria, site unspecified  AKI (acute kidney injury) (HCC)    Rx / DC Orders ED Discharge Orders     None  Gilda Crease, MD 12/14/20 0157

## 2020-12-13 NOTE — Telephone Encounter (Signed)
I sent the prescription over to the [facility] today. Misty Stanley

## 2020-12-13 NOTE — ED Notes (Signed)
Pt ambulatory to restroom without assistance 

## 2020-12-13 NOTE — ED Triage Notes (Signed)
Pt reports hypotensive per her home BP machine (98/57); triage BP 141/68; pt further reports an episode yesterday where she had a sudden onset of slurred speech, weakness and confusion; pt reports all symptoms have resolved after 6-7 hours; education provided to pt of stroke symptoms and importance of emergent medical attention; pt verbalized understanding

## 2020-12-13 NOTE — Telephone Encounter (Signed)
Patient is calling because her prescription has not been sent to [facility] as of yet. Please advise. Resent as normal not as print.

## 2020-12-14 ENCOUNTER — Encounter (HOSPITAL_COMMUNITY): Payer: Self-pay | Admitting: Internal Medicine

## 2020-12-14 ENCOUNTER — Observation Stay (HOSPITAL_COMMUNITY): Payer: Medicare HMO

## 2020-12-14 ENCOUNTER — Observation Stay (HOSPITAL_BASED_OUTPATIENT_CLINIC_OR_DEPARTMENT_OTHER): Payer: Medicare HMO

## 2020-12-14 DIAGNOSIS — N179 Acute kidney failure, unspecified: Secondary | ICD-10-CM | POA: Diagnosis not present

## 2020-12-14 DIAGNOSIS — I6389 Other cerebral infarction: Secondary | ICD-10-CM | POA: Diagnosis not present

## 2020-12-14 LAB — DIFFERENTIAL
Abs Immature Granulocytes: 0.05 10*3/uL (ref 0.00–0.07)
Basophils Absolute: 0 10*3/uL (ref 0.0–0.1)
Basophils Relative: 0 %
Eosinophils Absolute: 0.7 10*3/uL — ABNORMAL HIGH (ref 0.0–0.5)
Eosinophils Relative: 5 %
Immature Granulocytes: 0 %
Lymphocytes Relative: 12 %
Lymphs Abs: 1.6 10*3/uL (ref 0.7–4.0)
Monocytes Absolute: 1.3 10*3/uL — ABNORMAL HIGH (ref 0.1–1.0)
Monocytes Relative: 10 %
Neutro Abs: 9.7 10*3/uL — ABNORMAL HIGH (ref 1.7–7.7)
Neutrophils Relative %: 73 %

## 2020-12-14 LAB — HEPATIC FUNCTION PANEL
ALT: 18 U/L (ref 0–44)
AST: 22 U/L (ref 15–41)
Albumin: 3.8 g/dL (ref 3.5–5.0)
Alkaline Phosphatase: 69 U/L (ref 38–126)
Bilirubin, Direct: 0.1 mg/dL (ref 0.0–0.2)
Indirect Bilirubin: 0.6 mg/dL (ref 0.3–0.9)
Total Bilirubin: 0.7 mg/dL (ref 0.3–1.2)
Total Protein: 6.8 g/dL (ref 6.5–8.1)

## 2020-12-14 LAB — BASIC METABOLIC PANEL
Anion gap: 11 (ref 5–15)
BUN: 37 mg/dL — ABNORMAL HIGH (ref 8–23)
CO2: 24 mmol/L (ref 22–32)
Calcium: 9 mg/dL (ref 8.9–10.3)
Chloride: 100 mmol/L (ref 98–111)
Creatinine, Ser: 1.56 mg/dL — ABNORMAL HIGH (ref 0.44–1.00)
GFR, Estimated: 34 mL/min — ABNORMAL LOW (ref >60)
Glucose, Bld: 93 mg/dL (ref 70–99)
Potassium: 3.5 mmol/L (ref 3.5–5.1)
Sodium: 135 mmol/L (ref 135–145)

## 2020-12-14 LAB — LIPID PANEL
Cholesterol: 196 mg/dL (ref 0–200)
HDL: 89 mg/dL (ref >40)
LDL Cholesterol: 92 mg/dL (ref 0–99)
Total CHOL/HDL Ratio: 2.2 RATIO
Triglycerides: 77 mg/dL (ref <150)
VLDL: 15 mg/dL (ref 0–40)

## 2020-12-14 LAB — URINALYSIS, ROUTINE W REFLEX MICROSCOPIC
Bilirubin Urine: NEGATIVE
Glucose, UA: NEGATIVE mg/dL
Ketones, ur: NEGATIVE mg/dL
Nitrite: NEGATIVE
Protein, ur: 30 mg/dL — AB
Specific Gravity, Urine: 1.014 (ref 1.005–1.030)
WBC, UA: >50 WBC/hpf — ABNORMAL HIGH (ref 0–5)
pH: 5 (ref 5.0–8.0)

## 2020-12-14 LAB — RESP PANEL BY RT-PCR (FLU A&B, COVID) ARPGX2
Influenza A by PCR: NEGATIVE
Influenza B by PCR: NEGATIVE
SARS Coronavirus 2 by RT PCR: NEGATIVE

## 2020-12-14 LAB — ECHOCARDIOGRAM COMPLETE
Area-P 1/2: 3.37 cm2
Height: 62.5 in
S' Lateral: 3.2 cm
Weight: 2576 oz

## 2020-12-14 LAB — TROPONIN I (HIGH SENSITIVITY)
Troponin I (High Sensitivity): 65 ng/L — ABNORMAL HIGH (ref <18)
Troponin I (High Sensitivity): 66 ng/L — ABNORMAL HIGH (ref <18)

## 2020-12-14 LAB — HEMOGLOBIN A1C
Hgb A1c MFr Bld: 5.8 % — ABNORMAL HIGH (ref 4.8–5.6)
Mean Plasma Glucose: 119.76 mg/dL

## 2020-12-14 MED ORDER — ACETAMINOPHEN 325 MG PO TABS: 650.0000 mg | ORAL_TABLET | ORAL | Status: DC | PRN

## 2020-12-14 MED ORDER — ALLOPURINOL 300 MG PO TABS
150.0000 mg | ORAL_TABLET | Freq: Every day | ORAL | Status: DC
  Administered 2020-12-14 – 2020-12-15 (×2): 150 mg via ORAL
  Filled 2020-12-14 (×2): qty 1

## 2020-12-14 MED ORDER — BUDESON-GLYCOPYRROL-FORMOTEROL 160-9-4.8 MCG/ACT IN AERO: 2.0000 | INHALATION_SPRAY | Freq: Two times a day (BID) | RESPIRATORY_TRACT | Status: DC

## 2020-12-14 MED ORDER — ACETAMINOPHEN 650 MG RE SUPP: 650.0000 mg | RECTAL | Status: DC | PRN

## 2020-12-14 MED ORDER — ALBUTEROL SULFATE HFA 108 (90 BASE) MCG/ACT IN AERS: 2.0000 | INHALATION_SPRAY | Freq: Four times a day (QID) | RESPIRATORY_TRACT | Status: DC | PRN

## 2020-12-14 MED ORDER — STROKE: EARLY STAGES OF RECOVERY BOOK
Freq: Once | Status: DC
  Filled 2020-12-14: qty 1

## 2020-12-14 MED ORDER — SODIUM CHLORIDE 0.9 % IV SOLN
1.0000 g | INTRAVENOUS | Status: DC
  Administered 2020-12-14: 1 g via INTRAVENOUS
  Filled 2020-12-14: qty 10

## 2020-12-14 MED ORDER — HEPARIN SODIUM (PORCINE) 5000 UNIT/ML IJ SOLN
5000.0000 [IU] | Freq: Three times a day (TID) | INTRAMUSCULAR | Status: DC
  Administered 2020-12-14 – 2020-12-15 (×4): 5000 [IU] via SUBCUTANEOUS
  Filled 2020-12-14 (×4): qty 1

## 2020-12-14 MED ORDER — CITALOPRAM HYDROBROMIDE 20 MG PO TABS
20.0000 mg | ORAL_TABLET | Freq: Every morning | ORAL | Status: DC
  Administered 2020-12-14: 20 mg via ORAL
  Filled 2020-12-14: qty 1

## 2020-12-14 MED ORDER — VENLAFAXINE HCL 37.5 MG PO TABS
37.5000 mg | ORAL_TABLET | Freq: Two times a day (BID) | ORAL | Status: DC
  Administered 2020-12-14 – 2020-12-15 (×3): 37.5 mg via ORAL
  Filled 2020-12-14 (×3): qty 1

## 2020-12-14 MED ORDER — ZOLPIDEM TARTRATE 5 MG PO TABS
5.0000 mg | ORAL_TABLET | Freq: Once | ORAL | Status: AC
  Administered 2020-12-14: 5 mg via ORAL
  Filled 2020-12-14: qty 1

## 2020-12-14 MED ORDER — SODIUM CHLORIDE 0.9 % IV SOLN
2.0000 g | Freq: Once | INTRAVENOUS | Status: AC
  Administered 2020-12-14: 2 g via INTRAVENOUS
  Filled 2020-12-14: qty 20

## 2020-12-14 MED ORDER — PANTOPRAZOLE SODIUM 40 MG PO TBEC
40.0000 mg | DELAYED_RELEASE_TABLET | Freq: Every day | ORAL | Status: DC
  Administered 2020-12-14 – 2020-12-15 (×2): 40 mg via ORAL
  Filled 2020-12-14 (×2): qty 1

## 2020-12-14 MED ORDER — UMECLIDINIUM BROMIDE 62.5 MCG/INH IN AEPB
1.0000 | INHALATION_SPRAY | Freq: Every day | RESPIRATORY_TRACT | Status: DC
  Administered 2020-12-15: 1 via RESPIRATORY_TRACT
  Filled 2020-12-14: qty 7

## 2020-12-14 MED ORDER — FLUTICASONE FUROATE-VILANTEROL 100-25 MCG/INH IN AEPB
1.0000 | INHALATION_SPRAY | Freq: Every day | RESPIRATORY_TRACT | Status: DC
  Administered 2020-12-15: 1 via RESPIRATORY_TRACT
  Filled 2020-12-14: qty 28

## 2020-12-14 MED ORDER — PERFLUTREN LIPID MICROSPHERE
1.0000 mL | INTRAVENOUS | Status: AC | PRN
  Administered 2020-12-14: 2 mL via INTRAVENOUS

## 2020-12-14 MED ORDER — HYDROCODONE-ACETAMINOPHEN 5-325 MG PO TABS
1.0000 | ORAL_TABLET | Freq: Four times a day (QID) | ORAL | Status: DC | PRN
Start: 2020-12-14 — End: 2020-12-15
  Administered 2020-12-14 – 2020-12-15 (×3): 1 via ORAL
  Filled 2020-12-14 (×3): qty 1

## 2020-12-14 MED ORDER — ACETAMINOPHEN 160 MG/5ML PO SOLN: 650.0000 mg | ORAL | Status: DC | PRN

## 2020-12-14 MED ORDER — HYDRALAZINE HCL 20 MG/ML IJ SOLN: 10.0000 mg | INTRAMUSCULAR | Status: DC | PRN

## 2020-12-14 MED ORDER — DULOXETINE HCL 60 MG PO CPEP
60.0000 mg | ORAL_CAPSULE | Freq: Every day | ORAL | Status: DC
  Administered 2020-12-14: 60 mg via ORAL
  Filled 2020-12-14: qty 1
  Filled 2020-12-14: qty 2

## 2020-12-14 MED ORDER — ATORVASTATIN CALCIUM 10 MG PO TABS
10.0000 mg | ORAL_TABLET | Freq: Every day | ORAL | Status: DC
  Administered 2020-12-14 – 2020-12-15 (×2): 10 mg via ORAL
  Filled 2020-12-14 (×2): qty 1

## 2020-12-14 MED ORDER — SODIUM CHLORIDE 0.9 % IV SOLN: INTRAVENOUS | Status: DC

## 2020-12-14 NOTE — H&P (Signed)
History and Physical    Heather [name] ASN:[id number] DOB: 19-Mar-1940 DOA: 12/13/2020  PCP: Beatrix Fetters, MD   Patient coming from: Home  Chief Complaint: Hypotension, speech slurring  HPI: Heather [name] is a 80 y.o. female with medical history significant for anxiety, COPD, GERD, gout, and hypertension, who presented to the ED for evaluation of low blood pressure readings.  She states that it was approximately 90/50 at home and she had avoided her fluid pills yesterday.  She did state that yesterday she had more symptoms of generalized weakness as well as some slurring of her speech.  She says she had some mild confusion during that time and this lasted for about 7 hours.  She had some shaking and teeth chattering at that time as well and she took an oral temperature with a reading of 99.1 Fahrenheit.  She overall felt better today with no further speech slurring, but it was her concern for hypotension that brought her to the ED.   ED Course: Vital signs stable and patient was afebrile.  Leukocytosis of 13,500 noted.  BUN 37 and creatinine 1.56.  Troponin 66 with flat trend and prior 65.  CT of the head demonstrating small bifrontal infarcts of indeterminate age.  She has been started on Rocephin for concern of UTI as well and has been given IV fluid for AKI.  Review of Systems: Reviewed as noted above, otherwise negative.  Past Medical History:  Diagnosis Date   Anemia    Anxiety    Arthritis    COPD (chronic obstructive pulmonary disease) (HCC)    Depression    Dry eyes 04/14/2014   GERD (gastroesophageal reflux disease)    Glaucoma    Gout    Gout 10/23/2018   HTN (hypertension)     Past Surgical History:  Procedure Laterality Date   ABDOMINAL HYSTERECTOMY     BACK SURGERY     lumbar-disc   BUNIONECTOMY Bilateral    FOOT SURGERY Left    repair of "kissing Cousins"   JOINT REPLACEMENT Bilateral    hip    JOINT REPLACEMENT Right 2010 or 2012   knee   KNEE SURGERY      SHOULDER ARTHROSCOPY Right    shoulder surgery in [place] about 2000   SHOULDER HEMI-ARTHROPLASTY Left 03/25/2014   Procedure: LEFT SHOULDER HEMI-ARTHROPLASTY;  Surgeon: Vickki Hearing, MD;  Location: [facility];  Service: Orthopedics;  Laterality: Left;     reports that she quit smoking about 35 years ago. Her smoking use included cigarettes. She has a 40.00 pack-year smoking history. She has never used smokeless tobacco. She reports that she does not drink alcohol and does not use drugs.  Allergies  Allergen Reactions   Lovastatin Swelling    Patient states that her tongue swells and she gets a tingling feeling all over. Patient states that her tongue swells and she gets a tingling feeling all over.   Lisinopril     Family History  Problem Relation Age of Onset   High blood pressure Mother    Aneurysm Father    Diabetes Brother    Pancreatitis Brother    Hypertension Niece    Congestive Heart Failure Niece     Prior to Admission medications   Medication Sig Start Date End Date Taking? Authorizing Provider  allopurinol (ZYLOPRIM) 300 MG tablet Take 0.5 tablets (150 mg total) by mouth daily. 02/25/20   Anabel Halon, MD  amlodipine-olmesartan (AZOR) 10-20 MG tablet Take 1 tablet  by mouth daily.    [provider]  atorvastatin (LIPITOR) 10 MG tablet Take 1 tablet (10 mg total) by mouth daily. 01/26/20   Anabel Halon, MD  Budeson-Glycopyrrol-Formoterol (BREZTRI AEROSPHERE) 160-9-4.8 MCG/ACT AERO Inhale 2 puffs into the lungs 2 (two) times daily. 07/27/20   Nyoka Cowden, MD  Cholecalciferol (DIALYVITE VITAMIN D 5000 PO) Take by mouth.    [provider]  citalopram (CELEXA) 20 MG tablet Take 20 mg by mouth every morning.    [provider]  DULoxetine (CYMBALTA) 60 MG capsule Take 60 mg by mouth daily.    [provider]  famotidine (PEPCID) 20 MG tablet One after supper 11/01/20   Nyoka Cowden, MD  hydrochlorothiazide (MICROZIDE) 12.5 MG  capsule Take 12.5 mg by mouth daily.    [provider]  HYDROcodone-acetaminophen (NORCO/VICODIN) 5-325 MG tablet hydrocodone 5 mg-acetaminophen 325 mg tablet  Take 1 tablet twice a day by oral route as needed.    [provider]  ketoconazole (NIZORAL) 2 % cream Apply 1 application topically daily. 12/13/20   Louann Sjogren, MD  NON [facility]  Anti-fungal (nail)-#1 Dr Arne Cleveland    [provider]  omeprazole (PRILOSEC) 20 MG capsule Take 30- 60 min before your first and last meals of the day 05/25/20   Nyoka Cowden, MD  PROAIR HFA 108 (90 BASE) MCG/ACT inhaler Inhale 2 puffs into the lungs every 6 (six) hours as needed for wheezing or shortness of breath.  02/15/14   [provider]  venlafaxine (EFFEXOR) 37.5 MG tablet Take 37.5 mg by mouth 2 (two) times daily.    [provider]  zolpidem (AMBIEN) 10 MG tablet Take 1 tablet (10 mg total) by mouth at bedtime as needed for sleep. 12/07/20 01/06/21  Coralyn Helling, MD    Physical Exam: Vitals:   12/14/20 0200 12/14/20 0645 12/14/20 0700 12/14/20 0734  BP: 140/68 (!) 152/74    Pulse: 76 76 69   Resp: 11 14 11    Temp:    98.4 F (36.9 C)  TempSrc:    Oral  SpO2: 98% 98% 96%   Weight:      Height:        Constitutional: NAD, calm, comfortable Vitals:   12/14/20 0200 12/14/20 0645 12/14/20 0700 12/14/20 0734  BP: 140/68 (!) 152/74    Pulse: 76 76 69   Resp: 11 14 11    Temp:    98.4 F (36.9 C)  TempSrc:    Oral  SpO2: 98% 98% 96%   Weight:      Height:       Eyes: lids and conjunctivae normal Neck: normal, supple Respiratory: clear to auscultation bilaterally. Normal respiratory effort. No accessory muscle use.  Cardiovascular: Regular rate and rhythm, no murmurs. Abdomen: no tenderness, no distention. Bowel sounds positive.  Musculoskeletal:  No edema. Skin: no rashes, lesions, ulcers.  Psychiatric: Flat affect  Labs on Admission: I have personally  reviewed following labs and imaging studies  CBC: Recent Labs  Lab 12/13/20 2313  WBC 13.5*  NEUTROABS 9.7*  HGB 12.1  HCT 37.2  MCV 98.4  PLT 163   Basic Metabolic Panel: Recent Labs  Lab 12/13/20 2313  NA 135  K 3.5  CL 100  CO2 24  GLUCOSE 93  BUN 37*  CREATININE 1.56*  CALCIUM 9.0   GFR: Estimated Creatinine Clearance: 28.2 mL/min (A) (by C-G formula based on SCr of 1.56 mg/dL (H)). Liver Function Tests: Recent  Labs  Lab 12/13/20 2313  AST 22  ALT 18  ALKPHOS 69  BILITOT 0.7  PROT 6.8  ALBUMIN 3.8   No results for input(s): LIPASE, AMYLASE in the last 168 hours. No results for input(s): AMMONIA in the last 168 hours. Coagulation Profile: No results for input(s): INR, PROTIME in the last 168 hours. Cardiac Enzymes: No results for input(s): CKTOTAL, CKMB, CKMBINDEX, TROPONINI in the last 168 hours. BNP (last 3 results) No results for input(s): PROBNP in the last 8760 hours. HbA1C: No results for input(s): HGBA1C in the last 72 hours. CBG: No results for input(s): GLUCAP in the last 168 hours. Lipid Profile: No results for input(s): CHOL, HDL, LDLCALC, TRIG, CHOLHDL, LDLDIRECT in the last 72 hours. Thyroid Function Tests: No results for input(s): TSH, T4TOTAL, FREET4, T3FREE, THYROIDAB in the last 72 hours. Anemia Panel: No results for input(s): VITAMINB12, FOLATE, FERRITIN, TIBC, IRON, RETICCTPCT in the last 72 hours. Urine analysis:    Component Value Date/Time   COLORURINE YELLOW 12/14/2020 0051   APPEARANCEUR CLOUDY (A) 12/14/2020 0051   LABSPEC 1.014 12/14/2020 0051   PHURINE 5.0 12/14/2020 0051   GLUCOSEU NEGATIVE 12/14/2020 0051   HGBUR SMALL (A) 12/14/2020 0051   BILIRUBINUR NEGATIVE 12/14/2020 0051   KETONESUR NEGATIVE 12/14/2020 0051   PROTEINUR 30 (A) 12/14/2020 0051   NITRITE NEGATIVE 12/14/2020 0051   LEUKOCYTESUR LARGE (A) 12/14/2020 0051    Radiological Exams on Admission: CT HEAD WO CONTRAST ( )  Result Date:  12/14/2020 CLINICAL DATA:  Altered mental status, slurred speech, hypotensive episode EXAM: CT HEAD WITHOUT CONTRAST TECHNIQUE: Contiguous axial images were obtained from the base of the skull through the vertex without intravenous contrast. COMPARISON:  None. FINDINGS: Brain: Normal anatomic configuration. Parenchymal volume is relatively well preserved for the patient's age. Bifrontal subcortical white matter infarcts are noted bilaterally, age indeterminate. No abnormal intra or extra-axial mass lesion or fluid collection. No abnormal mass effect or midline shift. No evidence of acute intracranial hemorrhage. Ventricular size is normal. Cerebellum unremarkable. Vascular: No asymmetric hyperdense vasculature at the skull base. Skull: Intact Sinuses/Orbits: Paranasal sinuses are clear. Orbits are unremarkable. Other: Mastoid air cells and middle ear cavities are clear. IMPRESSION: Small bifrontal subcortical white matter infarcts, age indeterminate. Correlation with patient's neurologic examination is recommended. MRI examination may be more helpful for further evaluation if there are concordant clinical symptoms. Electronically Signed   By: Helyn Numbers M.D.   On: 12/14/2020 00:17   DG Chest Port 1 View  Result Date: 12/13/2020 CLINICAL DATA:  Weakness today. Hypotensive. Episode yesterday with slurred speech, weakness, and confusion. EXAM: PORTABLE CHEST 1 VIEW COMPARISON:  05/25/2020 FINDINGS: Shallow inspiration with atelectasis in the lung bases. No airspace disease or consolidation is suggested. No pleural effusions. No pneumothorax. Mediastinal contours appear intact. Heart size is normal. Calcification of the aorta. Postoperative changes in both shoulders. IMPRESSION: Shallow inspiration with linear atelectasis in the lung bases. No active pulmonary disease. Electronically Signed   By: Burman Nieves M.D.   On: 12/13/2020 23:27    EKG: Independently reviewed. SR 69bpm with  LVH.  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-indeterminate -Further evaluation with MRI pending -PT/OT/SLP evaluation -Lipid panel, hemoglobin A1c -2D echocardiogram -Inpatient neurology consultation  Mild troponin elevation -Likely related to above -Flat trend noted -No chest pain, will check 2D echocardiogram as noted above -No concern for ACS  UTI with leukocytosis -Continue on Rocephin -Check  urine culture -Patient complained of urinary frequency  AKI -Baseline creatinine appears to be 0.9-1 -Continue IV fluid -Check input and output -Avoid nephrotoxic agents -Consider further investigation as needed if not improving  History of anxiety -Continue home anxiety medications  History of hypertension -Hold antihypertensives and allow for permissive hypertension -IV hydralazine as needed  GERD -PPI  History of gout -Continue allopurinol  History of COPD -No acute bronchospasms noted -DuoNebs as needed   DVT prophylaxis: Heparin Code Status: Full Family Communication: Discussed with sister on phone 10/11 Disposition Plan: Admit for CVA evaluation, treatment of UTI, AKI Consults called: Neurology Admission status: Inpatient, telemetry   Arrian Manson D Shalimar Mcclain DO [facility]  If 7PM-7AM, please contact night-coverage [email or web address]  12/14/2020, 7:58 AM

## 2020-12-14 NOTE — Progress Notes (Signed)
*  PRELIMINARY RESULTS* Echocardiogram 2D Echocardiogram has been performed with [facility].  Stacey Drain 12/14/2020, 10:12 AM

## 2020-12-14 NOTE — Evaluation (Addendum)
Occupational Therapy Evaluation Patient Details Name: Heather [name] MRN: [id number] DOB: 04/15/1940 Today's Date: 12/14/2020   History of Present Illness Heather [name] is a 80 y.o. female with medical history significant for anxiety, COPD, GERD, gout, and hypertension, who presented to the ED for evaluation of low blood pressure readings.  She states that it was approximately 90/50 at home and she had avoided her fluid pills yesterday.  She did state that yesterday she had more symptoms of generalized weakness as well as some slurring of her speech.  She says she had some mild confusion during that time and this lasted for about 7 hours.  She had some shaking and teeth chattering at that time as well and she took an oral temperature with a reading of 99.1 Fahrenheit.  She overall felt better today with no further speech slurring, but it was her concern for hypotension that brought her to the ED.   Clinical Impression   Pt agreeable to OT/PT co-evaluation. Pt demonstrated Mod I level of assist for bed mobility and functional transfers via ambulation in hall and return to EOB. Pt presents with significant L shoulder weakness at 3+/5  which the pt reported as new. Pt demonstrated moderate difficulty tracking a visual stimulus in all quadrants due to overshooting the stimulus. Fine motor skills appear intact. Pt left in bed with call bell within reach. Pt will benefit from continued OT in the hospital and recommended venue below to increase strength, balance, and endurance for safe ADL's.      Recommendations for follow up therapy are one component of a multi-disciplinary discharge planning process, led by the attending physician.  Recommendations may be updated based on patient status, additional functional criteria and insurance authorization.   Follow Up Recommendations  Outpatient OT    Equipment Recommendations  None recommended by OT    Recommendations for Other Services   Optometrist  evaluation    Precautions / Restrictions Precautions Precautions: None Restrictions Weight Bearing Restrictions: No      Mobility Bed Mobility Overal bed mobility: Modified Independent             General bed mobility comments: good return for sitting up at bedside with HOB flat    Transfers Overall transfer level: Modified independent                    Balance Overall balance assessment: No apparent balance deficits (not formally assessed)                                         ADL either performed or assessed with clinical judgement   ADL Overall ADL's : Needs assistance/impaired                         Toilet Transfer: Modified Independent;Ambulation Toilet Transfer Details (indicate cue type and reason): simulated via pt ambulation in hall and return to EOB                 Vision Baseline Vision/History: 0 No visual deficits (cataracts in the past that has resolved.) Ability to See in Adequate Light: 0 Adequate Patient Visual Report: No change from baseline Vision Assessment?: Yes Tracking/Visual Pursuits: Other (comment) (Noted to overshoot stimulus throughout tracking in all quadrants.)                Pertinent Vitals/Pain  Pain Assessment: No/denies pain     Hand Dominance Right   Extremity/Trunk Assessment Upper Extremity Assessment Upper Extremity Assessment: LUE deficits/detail LUE Deficits / Details: 3+/5 MMT shoulder flexion and abduction. 4/5 elbow flexion; 4/5 grip strength. Pt reports strength deficits are new. LUE Sensation: WNL LUE Coordination: WNL   Lower Extremity Assessment Lower Extremity Assessment: Defer to PT evaluation LLE Deficits / Details: grossly -4/5 LLE Sensation: WNL LLE Coordination: WNL   Cervical / Trunk Assessment Cervical / Trunk Assessment: Normal   Communication Communication Communication: No difficulties   Cognition Arousal/Alertness: Awake/alert Behavior  During Therapy: WFL for tasks assessed/performed Overall Cognitive Status: Within Functional Limits for tasks assessed                                                Home Living Family/patient expects to be discharged to:: Private residence Living Arrangements: Alone Available Help at Discharge: Friend(s);Available PRN/intermittently Type of Home: Apartment Home Access: Level entry     Home Layout: One level     Bathroom Shower/Tub: Producer, television/film/video: Handicapped height Bathroom Accessibility: Yes   Home Equipment: Environmental consultant - 2 wheels;Cane - single point;Bedside commode;Shower seat;Shower seat - built in;Grab bars - tub/shower          Prior Functioning/Environment Level of Independence: Independent with assistive device(s)        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, drives        OT Problem List: Decreased strength;Decreased range of motion      OT Treatment/Interventions: Self-care/ADL training;Therapeutic exercise;Patient/family education;Therapeutic activities;Neuromuscular education    OT Goals(Current goals can be found in the care plan section) Acute Rehab OT Goals Patient Stated Goal: Return home with friend assist OT Goal Formulation: With patient Time For Goal Achievement: 12/28/20 Potential to Achieve Goals: Good  OT Frequency: Min 1X/week               Co-evaluation PT/OT/SLP Co-Evaluation/Treatment: Yes Reason for Co-Treatment: To address functional/ADL transfers   OT goals addressed during session: ADL's and self-care      AM-PAC OT "6 Clicks" Daily Activity     Outcome Measure Help from another person eating meals?: None Help from another person taking care of personal grooming?: None Help from another person toileting, which includes using toliet, bedpan, or urinal?: None Help from another person bathing (including washing, rinsing, drying)?: None Help from another person to put on and taking off  regular upper body clothing?: None Help from another person to put on and taking off regular lower body clothing?: None 6 Click Score: 24   End of Session Nurse Communication: Mobility status  Activity Tolerance:   Patient left: in bed;with call bell/phone within reach  OT Visit Diagnosis: Muscle weakness (generalized) (M62.81);Hemiplegia and hemiparesis Hemiplegia - Right/Left: Left Hemiplegia - dominant/non-dominant: Non-Dominant Hemiplegia - caused by: Unspecified                Time: 2542-7062 OT Time Calculation (min): 14 min Charges:  OT General Charges $OT Visit: 1 Visit OT Evaluation $OT Eval Low Complexity: 1 Low  Theseus Birnie OT, MOT  Danie Chandler 12/14/2020, 9:59 AM

## 2020-12-14 NOTE — Plan of Care (Signed)
  Problem: Acute Rehab OT Goals (only OT should resolve) Goal: Pt/Caregiver Will Perform Home Exercise Program Flowsheets (Taken 12/14/2020 1000) Pt/caregiver will Perform Home Exercise Program:  Increased strength  Left upper extremity  Independently   Dionne Knoop OT, MOT

## 2020-12-14 NOTE — ED Notes (Addendum)
Patient transported to MRI 

## 2020-12-14 NOTE — Consult Note (Signed)
[facility] Heather Gutridge A. Gerilyn Pilgrim, MD     [email or web address]          Heather [name] is an 80 y.o. female.   ASSESSMENT/PLAN: Global weakness, ataxia/ gait impairment and dysarthria likely due to acute medical illness including dehydration, renal failure and UTI. Imaging does not show recent infarcts.  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  Chronic pain syndrome  Nonspecific tremor   The patient reports that 3 days ago on Sunday she not feel. She felt really fatigued throughout the entire day. On walking she stumbled in almost fell. She also felt lightheaded and dizzy. The patient decided to check her blood pressure the following day and noted that she was hypotensive systolic in the 90s and decided to seek medical attention. She was noted to dysarthria but no clear focal weakness or numbness. She denies any dysarthria, dysphagia palpitation or chest pain. No headaches are reported. The patient has been noted to multiple metabolic issues including UTI and renal failure along with dehydration. She has been treated appropriately reports that she has improved significantly. The patient is being followed in the office for tremor and chronic pain. She is being given medications for the tremor at this time.  The patient indicates she has never had a stroke in as far she is aware. The review systems otherwise unrevealing.   GENERAL:  This is a very pleasant female who me the recognizes me on entering the room.  HEENT:  Neck is supple and no trauma noted.  ABDOMEN: soft  EXTREMITIES: No edema; there is marked arthritic changes of the knees bilaterally. She is status post total right knee arthroplasty with typical is postsurgical scar.   BACK:  Normal  SKIN: Normal by inspection.    MENTAL STATUS: Alert and oriented -  including orientation to the month and her age.  Speech, language and cognition are generally intact. Judgment and insight normal.   CRANIAL NERVES: Pupils are equal, round and reactive to light and accomodation; extra ocular movements are full, there is no significant nystagmus; visual fields are full; upper and lower facial muscles are normal in strength and symmetric, there is no flattening of the nasolabial folds; tongue is midline; uvula is midline; shoulder elevation is normal.  MOTOR: Normal tone, bulk and strength; no pronator drift.  There is no drift of the upper lower extremities.  COORDINATION: Left finger to nose is normal, right finger to nose is normal, No rest tremor; no intention tremor; no postural tremor; no bradykinesia.  REFLEXES: Deep tendon reflexes are symmetrical and normal.   SENSATION: Normal to light touch, temperature, and pain.      Blood pressure 120/65, pulse 80, temperature 98 F (36.7 C), temperature source Oral, resp. rate 16, height 5' 2.5" (1.588 m), weight 74 kg, SpO2 99 %.  Past Medical History:  Diagnosis Date   Anemia    Anxiety    Arthritis    COPD (chronic obstructive pulmonary disease) (HCC)    Depression    Dry eyes 04/14/2014   GERD (gastroesophageal reflux disease)    Glaucoma    Gout    Gout 10/23/2018   HTN (hypertension)     Past Surgical History:  Procedure Laterality Date   ABDOMINAL HYSTERECTOMY     BACK SURGERY     lumbar-disc   BUNIONECTOMY Bilateral    FOOT SURGERY Left    repair of "  kissing Cousins"   JOINT REPLACEMENT Bilateral    hip    JOINT REPLACEMENT Right 2010 or 2012   knee   KNEE SURGERY     SHOULDER ARTHROSCOPY Right    shoulder surgery in [place] about 2000   SHOULDER HEMI-ARTHROPLASTY Left 03/25/2014   Procedure: LEFT SHOULDER HEMI-ARTHROPLASTY;  Surgeon: Vickki Hearing, MD;  Location: [facility];  Service: Orthopedics;  Laterality: Left;    Family History  Problem Relation Age of Onset   High blood pressure Mother    Aneurysm Father     Diabetes Brother    Pancreatitis Brother    Hypertension Niece    Congestive Heart Failure Niece     Social History:  reports that she quit smoking about 35 years ago. Her smoking use included cigarettes. She has a 40.00 pack-year smoking history. She has never used smokeless tobacco. She reports that she does not drink alcohol and does not use drugs.  Allergies:  Allergies  Allergen Reactions   Lovastatin Swelling    Patient states that her tongue swells and she gets a tingling feeling all over. Patient states that her tongue swells and she gets a tingling feeling all over.   Lisinopril     Medications: Prior to Admission medications   Medication Sig Start Date End Date Taking? Authorizing Provider  allopurinol (ZYLOPRIM) 300 MG tablet Take 0.5 tablets (150 mg total) by mouth daily. 02/25/20  Yes Anabel Halon, MD  amlodipine-olmesartan (AZOR) 10-20 MG tablet Take 1 tablet by mouth daily.   Yes [provider]  atorvastatin (LIPITOR) 10 MG tablet Take 1 tablet (10 mg total) by mouth daily. 01/26/20  Yes Anabel Halon, MD  Budeson-Glycopyrrol-Formoterol (BREZTRI AEROSPHERE) 160-9-4.8 MCG/ACT AERO Inhale 2 puffs into the lungs 2 (two) times daily. 07/27/20  Yes Nyoka Cowden, MD  Cholecalciferol (DIALYVITE VITAMIN D 5000 PO) Take by mouth.   Yes [provider]  famotidine (PEPCID) 20 MG tablet One after supper 11/01/20  Yes Nyoka Cowden, MD  hydrochlorothiazide (MICROZIDE) 12.5 MG capsule Take 12.5 mg by mouth daily.   Yes [provider]  HYDROcodone-acetaminophen (NORCO/VICODIN) 5-325 MG tablet hydrocodone 5 mg-acetaminophen 325 mg tablet  Take 1 tablet twice a day by oral route as needed.   Yes [provider]  PROAIR HFA 108 (90 BASE) MCG/ACT inhaler Inhale 2 puffs into the lungs every 6 (six) hours as needed for wheezing or shortness of breath.  02/15/14  Yes [provider]  venlafaxine XR (EFFEXOR-XR) 37.5 MG 24 hr capsule Take  37.5 mg by mouth daily. 11/29/20  Yes [provider]  zolpidem (AMBIEN) 10 MG tablet Take 1 tablet (10 mg total) by mouth at bedtime as needed for sleep. 12/07/20 01/06/21 Yes Coralyn Helling, MD  ketoconazole (NIZORAL) 2 % cream Apply 1 application topically daily. Patient not taking: No sig reported 12/13/20   Louann Sjogren, MD  omeprazole (PRILOSEC) 20 MG capsule Take 30- 60 min before your first and last meals of the day Patient not taking: No sig reported 05/25/20   Nyoka Cowden, MD    Scheduled Meds:  allopurinol  150 mg Oral Daily   atorvastatin  10 mg Oral Daily   citalopram  20 mg Oral q morning   DULoxetine  60 mg Oral Daily   fluticasone furoate-vilanterol  1 puff Inhalation Daily   heparin  5,000 Units Subcutaneous Q8H   pantoprazole  40 mg Oral Daily   umeclidinium bromide  1 puff Inhalation  Daily   venlafaxine  37.5 mg Oral BID   Continuous Infusions:  sodium chloride 75 mL/hr at 12/14/20 1739   [START ON 12/15/2020] cefTRIAXone (ROCEPHIN)  IV     PRN Meds:.acetaminophen **OR** acetaminophen (TYLENOL) oral liquid 160 mg/5 mL **OR** acetaminophen, albuterol, hydrALAZINE, HYDROcodone-acetaminophen     Results for orders placed or performed during the hospital encounter of 12/13/20 (from the past 48 hour(s))  Hemoglobin A1c     Status: Abnormal   Collection Time: 12/13/20 11:11 PM  Result Value Ref Range   Hgb A1c MFr Bld 5.8 (H) 4.8 - 5.6 %    Comment: (NOTE) Pre diabetes:          5.7%-6.4%  Diabetes:              >6.4%  Glycemic control for   <7.0% adults with diabetes    Mean Plasma Glucose 119.76 mg/dL    Comment: Performed at [facility], [address] [place], [place] [address]  Basic metabolic panel     Status: Abnormal   Collection Time: 12/13/20 11:13 PM  Result Value Ref Range   Sodium 135 135 - 145 mmol/L   Potassium 3.5 3.5 - 5.1 mmol/L   Chloride 100 98 - 111 mmol/L   CO2 24 22 - 32 mmol/L   Glucose, Bld 93 70 - 99 mg/dL     Comment: Glucose reference range applies only to samples taken after fasting for at least 8 hours.   BUN 37 (H) 8 - 23 mg/dL   Creatinine, Ser 9.24 (H) 0.44 - 1.00 mg/dL   Calcium 9.0 8.9 - 26.8 mg/dL   GFR, Estimated 34 (L) >60 mL/min    Comment: (NOTE) Calculated using the CKD-EPI Creatinine Equation (2021)    Anion gap 11 5 - 15    Comment: Performed at [facility], [address] [place], [place] [address]  CBC     Status: Abnormal   Collection Time: 12/13/20 11:13 PM  Result Value Ref Range   WBC 13.5 (H) 4.0 - 10.5 K/uL   RBC 3.78 (L) 3.87 - 5.11 MIL/uL   Hemoglobin 12.1 12.0 - 15.0 g/dL   HCT 22.2 97.9 - 89.2 %   MCV 98.4 80.0 - 100.0 fL   MCH 32.0 26.0 - 34.0 pg   MCHC 32.5 30.0 - 36.0 g/dL   RDW 11.9 41.7 - 40.8 %   Platelets 163 150 - 400 K/uL   nRBC 0.0 0.0 - 0.2 %    Comment: Performed at [facility], [address], [place], [place] [address]  Hepatic function panel     Status: None   Collection Time: 12/13/20 11:13 PM  Result Value Ref Range   Total Protein 6.8 6.5 - 8.1 g/dL   Albumin 3.8 3.5 - 5.0 g/dL   AST 22 15 - 41 U/L   ALT 18 0 - 44 U/L   Alkaline Phosphatase 69 38 - 126 U/L   Total Bilirubin 0.7 0.3 - 1.2 mg/dL   Bilirubin, Direct 0.1 0.0 - 0.2 mg/dL   Indirect Bilirubin 0.6 0.3 - 0.9 mg/dL    Comment: Performed at [facility], [address] [place], [place] [address]  Troponin I (High Sensitivity)     Status: Abnormal   Collection Time: 12/13/20 11:13 PM  Result Value Ref Range   Troponin I (High Sensitivity) 65 (H) <18 ng/L    Comment: (NOTE) Elevated high sensitivity troponin I (hsTnI) values and significant  changes across serial measurements  may suggest ACS but many other  chronic and acute conditions are known to elevate hsTnI results.  Refer to the Links section for chest pain algorithms and additional  guidance. Performed at [facility], [address], [place], [place] [address]   Lactic acid, plasma     Status: None   Collection  Time: 12/13/20 11:13 PM  Result Value Ref Range   Lactic Acid, Venous 1.9 0.5 - 1.9 mmol/L    Comment: Performed at [facility], [address], [place], [place] [address]  Differential     Status: Abnormal   Collection Time: 12/13/20 11:13 PM  Result Value Ref Range   Neutrophils Relative % 73 %   Neutro Abs 9.7 (H) 1.7 - 7.7 K/uL   Lymphocytes Relative 12 %   Lymphs Abs 1.6 0.7 - 4.0 K/uL   Monocytes Relative 10 %   Monocytes Absolute 1.3 (H) 0.1 - 1.0 K/uL   Eosinophils Relative 5 %   Eosinophils Absolute 0.7 (H) 0.0 - 0.5 K/uL   Basophils Relative 0 %   Basophils Absolute 0.0 0.0 - 0.1 K/uL   Immature Granulocytes 0 %   Abs Immature Granulocytes 0.05 0.00 - 0.07 K/uL    Comment: Performed at [facility], [address], [place], [place] [address]  Urinalysis, Routine w reflex microscopic     Status: Abnormal   Collection Time: 12/14/20 12:51 AM  Result Value Ref Range   Color, Urine YELLOW YELLOW   APPearance CLOUDY (A) CLEAR   Specific Gravity, Urine 1.014 1.005 - 1.030   pH 5.0 5.0 - 8.0   Glucose, UA NEGATIVE NEGATIVE mg/dL   Hgb urine dipstick SMALL (A) NEGATIVE   Bilirubin Urine NEGATIVE NEGATIVE   Ketones, ur NEGATIVE NEGATIVE mg/dL   Protein, ur 30 (A) NEGATIVE mg/dL   Nitrite NEGATIVE NEGATIVE   Leukocytes,Ua LARGE (A) NEGATIVE   RBC / HPF 21-50 0 - 5 RBC/hpf   WBC, UA >50 (H) 0 - 5 WBC/hpf   Bacteria, UA RARE (A) NONE SEEN   Squamous Epithelial / LPF 6-10 0 - 5   Mucus PRESENT    Budding Yeast PRESENT    Hyaline Casts, UA PRESENT    Uric Acid Crys, UA PRESENT     Comment: Performed at [facility], [address] [place], [place] [address]  Troponin I (High Sensitivity)     Status: Abnormal   Collection Time: 12/14/20  1:20 AM  Result Value Ref Range   Troponin I (High Sensitivity) 66 (H) <18 ng/L    Comment: (NOTE) Elevated high sensitivity troponin I (hsTnI) values and significant  changes across serial measurements may suggest ACS but many other   chronic and acute conditions are known to elevate hsTnI results.  Refer to the "Links" section for chest pain algorithms and additional  guidance. Performed at [facility], [address], [place], [place] [address]   Lipid panel     Status: None   Collection Time: 12/14/20  1:20 AM  Result Value Ref Range   Cholesterol 196 0 - 200 mg/dL   Triglycerides 77 <132 mg/dL   HDL 89 >44 mg/dL   Total CHOL/HDL Ratio 2.2 RATIO   VLDL 15 0 - 40 mg/dL   LDL Cholesterol 92 0 - 99 mg/dL    Comment:        Total Cholesterol/HDL:CHD Risk Coronary Heart Disease Risk Table  Men   Women  1/2 Average Risk   3.4   3.3  Average Risk       5.0   4.4  2 X Average Risk   9.6   7.1  3 X Average Risk  23.4   11.0        Use the calculated Patient Ratio above and the CHD Risk Table to determine the patient's CHD Risk.        ATP III CLASSIFICATION (LDL):  <100     mg/dL   Optimal  233-007  mg/dL   Near or Above                    Optimal  130-159  mg/dL   Borderline  622-633  mg/dL   High  >354     mg/dL   Very High Performed at [facility], [address] [place], [place] [address]   Resp Panel by RT-PCR (Flu A&B, Covid) Nasopharyngeal Swab     Status: None   Collection Time: 12/14/20  1:50 AM   Specimen: Nasopharyngeal Swab; Nasopharyngeal(NP) swabs in vial transport medium  Result Value Ref Range   SARS Coronavirus 2 by RT PCR NEGATIVE NEGATIVE    Comment: (NOTE) SARS-CoV-2 target nucleic acids are NOT DETECTED.  The SARS-CoV-2 RNA is generally detectable in upper respiratory specimens during the acute phase of infection. The lowest concentration of SARS-CoV-2 viral copies this assay can detect is 138 copies/mL. A negative result does not preclude SARS-Cov-2 infection and should not be used as the sole basis for treatment or other patient management decisions. A negative result may occur with  improper specimen collection/handling, submission of specimen other than  nasopharyngeal swab, presence of viral mutation(s) within the areas targeted by this assay, and inadequate number of viral copies(<138 copies/mL). A negative result must be combined with clinical observations, patient history, and epidemiological information. The expected result is Negative.  Fact Sheet for Patients:  BloggerCourse.com  Fact Sheet for Healthcare Providers:  SeriousBroker.it  This test is no t yet approved or cleared by the [place] FDA and  has been authorized for detection and/or diagnosis of SARS-CoV-2 by FDA under an Emergency Use Authorization (EUA). This EUA will remain  in effect (meaning this test can be used) for the duration of the COVID-19 declaration under Section 564(b)(1) of the Act, 21 U.S.C.section 360bbb-3(b)(1), unless the authorization is terminated  or revoked sooner.       Influenza A by PCR NEGATIVE NEGATIVE   Influenza B by PCR NEGATIVE NEGATIVE    Comment: (NOTE) The Xpert Xpress SARS-CoV-2/FLU/RSV plus assay is intended as an aid in the diagnosis of influenza from Nasopharyngeal swab specimens and should not be used as a sole basis for treatment. Nasal washings and aspirates are unacceptable for Xpert Xpress SARS-CoV-2/FLU/RSV testing.  Fact Sheet for Patients: BloggerCourse.com  Fact Sheet for Healthcare Providers: SeriousBroker.it  This test is not yet approved or cleared by the [place] FDA and has been authorized for detection and/or diagnosis of SARS-CoV-2 by FDA under an Emergency Use Authorization (EUA). This EUA will remain in effect (meaning this test can be used) for the duration of the COVID-19 declaration under Section 564(b)(1) of the Act, 21 U.S.C. section 360bbb-3(b)(1), unless the authorization is terminated or revoked.  Performed at [facility], [address] [place], [place] [address]   Culture, blood  (Routine X 2) w Reflex to ID Panel     Status: None (Preliminary result)  Collection Time: 12/14/20  2:10 AM   Specimen: BLOOD  Result Value Ref Range   Specimen Description BLOOD LEFT ANTECUBITAL    Special Requests      BOTTLES DRAWN AEROBIC AND ANAEROBIC Blood Culture adequate volume   Culture      NO GROWTH < 12 HOURS Performed at [facility], [address] [place], [place] [address]    Report Status PENDING   Culture, blood (Routine X 2) w Reflex to ID Panel     Status: None (Preliminary result)   Collection Time: 12/14/20  2:10 AM   Specimen: BLOOD  Result Value Ref Range   Specimen Description BLOOD RIGHT ANTECUBITAL    Special Requests      BOTTLES DRAWN AEROBIC AND ANAEROBIC Blood Culture adequate volume   Culture      NO GROWTH < 12 HOURS Performed at [facility], [address] [place], [place] [address]    Report Status PENDING     Studies/Results:   BRAIN MRI  FINDINGS: Brain: There is no acute infarction or intracranial hemorrhage. There is no intracranial mass, mass effect, or edema. There is no hydrocephalus or extra-axial fluid collection. Patchy foci of T2 hyperintensity in the supratentorial white matter are nonspecific but may reflect mild to moderate chronic microvascular ischemic changes. Small chronic right cerebellar infarct. Ventricles and sulci are within normal limits in size and configuration.   Vascular: Major vessel flow voids at the skull base are preserved.   Skull and upper cervical spine: Normal marrow signal is preserved.   Sinuses/Orbits: Paranasal sinuses are aerated. Orbits are unremarkable.   Other: Sella is unremarkable.  Mastoid air cells are clear.   IMPRESSION: No evidence of recent infarction, hemorrhage, or mass.   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 Small chronic right cerebellar infarct.   HEAD CT IMPRESSION: Small bifrontal subcortical white matter infarcts, age indeterminate.  Correlation with patient's neurologic examination is recommended. MRI examination may be more helpful for further evaluation if there are concordant clinical symptoms.      TTE 1. Left ventricular ejection fraction, by estimation, is 50 to 55%. The  left ventricle has low normal function. The left ventricle demonstrates  regional wall motion abnormalities (see scoring diagram/findings for  description). Left ventricular diastolic   parameters are consistent with Grade I diastolic dysfunction (impaired  relaxation).   2. Right ventricular systolic function is normal. The right ventricular  size is normal.   3. Left atrial size was mildly dilated.   4. The mitral valve is normal in structure. No evidence of mitral valve  regurgitation. No evidence of mitral stenosis.   5. The aortic valve is tricuspid. Aortic valve regurgitation is not  visualized. Mild aortic valve sclerosis is present, with no evidence of  aortic valve stenosis.   6. The inferior vena cava is normal in size with greater than 50%  respiratory variability, suggesting right atrial pressure of 3 mmHg.      The brain MRI is reviewed in person and shows no changes on DWI. There is a small deep gray matter encephalomalacia involving the inferior right cerebellum consistent with remote infarct. White matter lesions are seen on FLAIR imaging although the distribution is unusual being less periventricular more juxtacortical and the gray-white area. No hemorrhage is noted.   Erin Obando A. Gerilyn [name], M.D.  Diplomate, Biomedical engineer of Psychiatry and Neurology ( Neurology). 12/14/2020, 6:38 PM

## 2020-12-14 NOTE — Evaluation (Signed)
Physical Therapy Evaluation Patient Details Name: Heather [name] MRN: [id number] DOB: 03/28/1940 Today's Date: 12/14/2020  History of Present Illness  Heather [name] is a 80 y.o. female with medical history significant for anxiety, COPD, GERD, gout, and hypertension, who presented to the ED for evaluation of low blood pressure readings.  She states that it was approximately 90/50 at home and she had avoided her fluid pills yesterday.  She did state that yesterday she had more symptoms of generalized weakness as well as some slurring of her speech.  She says she had some mild confusion during that time and this lasted for about 7 hours.  She had some shaking and teeth chattering at that time as well and she took an oral temperature with a reading of 99.1 Fahrenheit.  She overall felt better today with no further speech slurring, but it was her concern for hypotension that brought her to the ED.   Clinical Impression  Patient functioning near baseline for functional mobility and gait, other than new weakness on left side when given resistance, other wise patient demonstrates good return for bed mobility, transfers and ambulation in room/hallways without loss of balance.  Plan:  Patient discharged from physical therapy to care of nursing for ambulation daily as tolerated for length of stay.      Recommendations for follow up therapy are one component of a multi-disciplinary discharge planning process, led by the attending physician.  Recommendations may be updated based on patient status, additional functional criteria and insurance authorization.  Follow Up Recommendations Outpatient PT;Supervision - Intermittent    Equipment Recommendations  None recommended by PT    Recommendations for Other Services       Precautions / Restrictions Precautions Precautions: None Restrictions Weight Bearing Restrictions: No      Mobility  Bed Mobility Overal bed mobility: Modified Independent              General bed mobility comments: good return for sitting up at bedside with HOB flat    Transfers Overall transfer level: Modified independent                  Ambulation/Gait Ambulation/Gait assistance: Modified independent (Device/Increase time) Gait Distance (Feet): 100 Feet Assistive device: None Gait Pattern/deviations: WFL(Within Functional Limits) Gait velocity: decreased   General Gait Details: grossly WFL with good return for ambulation in room and hallways without loss of balance  Stairs            Wheelchair Mobility    Modified Rankin (Stroke Patients Only)       Balance Overall balance assessment: No apparent balance deficits (not formally assessed)                                           Pertinent Vitals/Pain Pain Assessment: No/denies pain    Home Living Family/patient expects to be discharged to:: Private residence Living Arrangements: Alone Available Help at Discharge: Friend(s);Available PRN/intermittently Type of Home: Apartment Home Access: Level entry     Home Layout: One level Home Equipment: Walker - 2 wheels;Cane - single point;Bedside commode;Shower seat;Shower seat - built in;Grab bars - tub/shower      Prior Function Level of Independence: Independent with assistive device(s)         Comments: Community ambulator using SPC PRN, drives     Hand Dominance   Dominant Hand: Right  Extremity/Trunk Assessment   Upper Extremity Assessment Upper Extremity Assessment: Defer to OT evaluation    Lower Extremity Assessment Lower Extremity Assessment: Overall WFL for tasks assessed;LLE deficits/detail LLE Deficits / Details: grossly -4/5 LLE Sensation: WNL LLE Coordination: WNL    Cervical / Trunk Assessment Cervical / Trunk Assessment: Normal  Communication   Communication: No difficulties  Cognition Arousal/Alertness: Awake/alert Behavior During Therapy: WFL for tasks  assessed/performed Overall Cognitive Status: Within Functional Limits for tasks assessed                                        General Comments      Exercises     Assessment/Plan    PT Assessment All further PT needs can be met in the next venue of care  PT Problem List Decreased strength;Decreased activity tolerance;Decreased mobility;Decreased balance       PT Treatment Interventions      PT Goals (Current goals can be found in the Care Plan section)  Acute Rehab PT Goals Patient Stated Goal: Return home with friend assist PT Goal Formulation: With patient Time For Goal Achievement: 12/14/20 Potential to Achieve Goals: Good    Frequency     Barriers to discharge        Co-evaluation               AM-PAC PT "6 Clicks" Mobility  Outcome Measure Help needed turning from your back to your side while in a flat bed without using bedrails?: None Help needed moving from lying on your back to sitting on the side of a flat bed without using bedrails?: None Help needed moving to and from a bed to a chair (including a wheelchair)?: None Help needed standing up from a chair using your arms (e.g., wheelchair or bedside chair)?: None Help needed to walk in hospital room?: None Help needed climbing 3-5 steps with a railing? : A Little 6 Click Score: 23    End of Session   Activity Tolerance: Patient tolerated treatment well Patient left: in bed;with call bell/phone within reach Nurse Communication: Mobility status PT Visit Diagnosis: Unsteadiness on feet (R26.81);Other abnormalities of gait and mobility (R26.89);Muscle weakness (generalized) (M62.81)    Time: 0881-1031 PT Time Calculation (min) (ACUTE ONLY): 22 min   Charges:   PT Evaluation $PT Eval Moderate Complexity: 1 Mod PT Treatments $Therapeutic Activity: 23-37 mins        9:40 AM, 12/14/20 Lonell Grandchild, MPT Physical Therapist with [facility] [phone]  office [phone] mobile phone

## 2020-12-14 NOTE — Progress Notes (Signed)
SLP Cancellation Note  Patient Details Name: Heather [name] MRN: [id number] DOB: 12/04/1940   Cancelled treatment:       Reason Eval/Treat Not Completed: SLP screened, no needs identified, will sign off. SLP screened Pt in room. Pt denies any changes in swallowing, speech, language, or cognition. MRI negative for acute changes. SLE will be deferred at this time. Reconsult if indicated. SLP will sign off.   Thank you,  Havery Moros, CCC-SLP [phone]  Ghadeer Kastelic 12/14/2020, 3:23 PM

## 2020-12-14 NOTE — ED Notes (Addendum)
Echo at bedside

## 2020-12-15 DIAGNOSIS — N179 Acute kidney failure, unspecified: Secondary | ICD-10-CM | POA: Diagnosis not present

## 2020-12-15 LAB — BASIC METABOLIC PANEL
Anion gap: 5 (ref 5–15)
BUN: 27 mg/dL — ABNORMAL HIGH (ref 8–23)
CO2: 28 mmol/L (ref 22–32)
Calcium: 8.6 mg/dL — ABNORMAL LOW (ref 8.9–10.3)
Chloride: 110 mmol/L (ref 98–111)
Creatinine, Ser: 1.11 mg/dL — ABNORMAL HIGH (ref 0.44–1.00)
GFR, Estimated: 51 mL/min — ABNORMAL LOW (ref >60)
Glucose, Bld: 94 mg/dL (ref 70–99)
Potassium: 3.9 mmol/L (ref 3.5–5.1)
Sodium: 143 mmol/L (ref 135–145)

## 2020-12-15 LAB — CBC
HCT: 35.6 % — ABNORMAL LOW (ref 36.0–46.0)
Hemoglobin: 11.1 g/dL — ABNORMAL LOW (ref 12.0–15.0)
MCH: 31.2 pg (ref 26.0–34.0)
MCHC: 31.2 g/dL (ref 30.0–36.0)
MCV: 100 fL (ref 80.0–100.0)
Platelets: 159 10*3/uL (ref 150–400)
RBC: 3.56 MIL/uL — ABNORMAL LOW (ref 3.87–5.11)
RDW: 14.2 % (ref 11.5–15.5)
WBC: 4.8 10*3/uL (ref 4.0–10.5)
nRBC: 0 % (ref 0.0–0.2)

## 2020-12-15 LAB — URINE CULTURE: Culture: 1000 — AB

## 2020-12-15 LAB — MAGNESIUM: Magnesium: 1.7 mg/dL (ref 1.7–2.4)

## 2020-12-15 MED ORDER — ASPIRIN EC 81 MG PO TBEC: 81.0000 mg | DELAYED_RELEASE_TABLET | Freq: Every day | ORAL | 2 refills | Status: AC

## 2020-12-15 MED ORDER — DULOXETINE HCL 60 MG PO CPEP
60.0000 mg | ORAL_CAPSULE | Freq: Every day | ORAL | 2 refills | Status: DC
Start: 2020-12-16 — End: 2021-10-07

## 2020-12-15 MED ORDER — CEPHALEXIN 500 MG PO CAPS: 500.0000 mg | ORAL_CAPSULE | Freq: Three times a day (TID) | ORAL | 0 refills | Status: AC

## 2020-12-15 NOTE — TOC Transition Note (Signed)
Transition of Care [facility]) - CM/SW Discharge Note   Patient Details  Name: DONNESHA KARG MRN: [id number] Date of Birth: 07/25/1940  [facility] CM/SW Contact:  Leitha Bleak, RN Phone Number: 12/15/2020, 2:42 PM   Clinical Narrative:   Patient discharging home. PT is recommending outpatient PT. Patient is agreeable. Orders placed for [place] office.      Final next level of care: Home/Self Care Barriers to Discharge: Continued Medical Work up   Patient Goals and CMS Choice Patient states their goals for this hospitalization and ongoing recovery are:: to go home. CMS Medicare.gov Compare Post Acute Care list provided to:: Patient Choice offered to / list presented to : Patient  Discharge Placement    Patient and family notified of of transfer: 12/15/20  Discharge Plan and Services    Out Patient PT

## 2020-12-15 NOTE — Discharge Summary (Addendum)
Physician Discharge Summary [facility]    Patient: Heather [name]                   Admit date: 12/13/2020   DOB: Jun 25, 1940             Discharge date:12/15/2020/12:17 PM ZOX:[id number]                          PCP: Wilburt Finlay, MD  Disposition: HOME with Home Health   Recommendations for Outpatient Follow-up:   Follow up: With PCP within 1 week  Discharge Condition: Stable   Code Status:   Code Status: Full Code  Diet recommendation: Regular healthy diet   Discharge Diagnoses:    Active Problems:   Acute kidney injury [facility])   History of Present Illness/ Hospital Course Heather [name] Summary:   HPI: Heather [name] is a 80 y.o. female with medical history significant for anxiety, COPD, GERD, gout, and hypertension, who presented to the ED for evaluation of low blood pressure readings.  She states that it was approximately 90/50 at home and she had avoided her fluid pills yesterday.  She did state that yesterday she had more symptoms of generalized weakness as well as some slurring of her speech.  She says she had some mild confusion during that time and this lasted for about 7 hours.  She had some shaking and teeth chattering at that time as well and she took an oral temperature with a reading of 99.1 Fahrenheit.  She overall felt better today with no further speech slurring, but it was her concern for hypotension that brought her to the ED.   ED Course: Vital signs stable and patient was afebrile.  Leukocytosis of 13,500 noted.  BUN 37 and creatinine 1.56.  Troponin 66 with flat trend and prior 65.  CT of the head demonstrating small bifrontal infarcts of indeterminate age.  She has been started on Rocephin for concern of UTI as well and has been given IV fluid for AKI.    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 -Appears to have prior bifrontal CVAs age-indeterminate -MRI of the brain was reviewed, all trouble and fork was identified negative any acute new stroke Mentation and  function back to normal -PT/OT/SLP -status post evaluation, at baseline -Lipid panel, hemoglobin A1c 5.8 -2D echocardiogram: Reviewed today for any acute abnormalities, E JF 50-55% -Neurologist was consulted, status post evaluation, recommending daily aspirin,   Mild troponin elevation -Likely related to above -Flat trend noted -No chest pain, will check 2D echocardiogram as noted above -No concern for ACS   UTI with leukocytosis -Continue on Rocephin >>> switch to p.o. Keflex -Urine culture positive for group A strep, agalactiae, only thousand colonies -Patient complained of urinary frequency   AKI -Baseline creatinine appears to be 0.9-1 --Status post IV fluid resuscitation, creatinine back to baseline -Check input and output -Avoid nephrotoxic agents    History of anxiety -Continue home anxiety medications   History of hypertension -Home medication hypertensive medications -IV hydralazine as needed   GERD -PPI   History of gout -Continue allopurinol   History of COPD -No acute bronchospasms noted -DuoNebs as needed   Disposition-discharging home  Discharge Instructions:   Discharge Instructions     Activity as tolerated - No restrictions   Complete by: As directed    [facility] pharmacist to adjust dose for Vancomycin, Aminoglycosides and other anti-infective therapies as requested by physician.   Complete  by: As directed    [facility] to provide Cath Flo 50m   Complete by: As directed    Administer for PICC line occlusion and as ordered by physician for other access device issues.   Anaphylaxis Kit: Provided to treat any anaphylactic reaction to the medication being provided to the patient if First Dose or when requested by physician   Complete by: As directed    Epinephrine 158mml vial / amp: Administer 0.19m4m0.19ml77mubcutaneously once for moderate to severe anaphylaxis, nurse to call physician and pharmacy when reaction occurs and  call 911 if needed for immediate care   Diphenhydramine 50mg41mIV vial: Administer 25-50mg 29mM PRN for first dose reaction, rash, itching, mild reaction, nurse to call physician and pharmacy when reaction occurs   Sodium Chloride 0.9% NS 500ml I2mdminister if needed for hypovolemic blood pressure drop or as ordered by physician after call to physician with anaphylactic reaction   Call MD for:  difficulty breathing, headache or visual disturbances   Complete by: As directed    Call MD for:  temperature >100.4   Complete by: As directed    Change dressing on IV access line weekly and PRN   Complete by: As directed    Diet - low sodium heart healthy   Complete by: As directed    Discharge instructions   Complete by: As directed    Follow with PCP in 1 week   Flush IV access with Sodium Chloride 0.9% and Heparin 10 units/ml or 100 units/ml   Complete by: As directed    Home infusion instructions - [facility]   Complete by: As directed    Instructions: Flush IV access with Sodium Chloride 0.9% and Heparin 10units/ml or 100units/ml   Change dressing on IV access line: Weekly and PRN   Instructions Cath Flo 2mg: Ad88mister for PICC Line occlusion and as ordered by physician for other access device   [facility] pharmacist to adjust dose for: Vancomycin, Aminoglycosides and other anti-infective therapies as requested by physician   Increase activity slowly   Complete by: As directed    Method of administration may be changed at the discretion of home infusion pharmacist based upon assessment of the patient and/or caregiver's ability to self-administer the medication ordered   Complete by: As directed         Medication List     STOP taking these medications    omeprazole 20 MG capsule Commonly known as: PRILOSEC       TAKE these medications    allopurinol 300 MG tablet Commonly known as: ZYLOPRIM Take 0.5 tablets (150 mg total) by mouth daily.    amlodipine-olmesartan 10-20 MG tablet Commonly known as: AZOR Take 1 tablet by mouth daily.   aspirin EC 81 MG tablet Take 1 tablet (81 mg total) by mouth daily. Swallow whole.   atorvastatin 10 MG tablet Commonly known as: LIPITOR Take 1 tablet (10 mg total) by mouth daily.   Breztri Aerosphere 160-9-4.8 MCG/ACT Aero Generic drug: Budeson-Glycopyrrol-Formoterol Inhale 2 puffs into the lungs 2 (two) times daily.   cephALEXin 500 MG capsule Commonly known as: KEFLEX Take 1 capsule (500 mg total) by mouth 3 (three) times daily for 5 days.   DIALYVITE VITAMIN D 5000 PO Take by mouth.   DULoxetine 60 MG capsule Commonly known as: CYMBALTA Take 1 capsule (60 mg total) by mouth daily. Start taking on: December 16, 2020   famotidine 20 MG tablet Commonly known as:  Pepcid One after supper   hydrochlorothiazide 12.5 MG capsule Commonly known as: MICROZIDE Take 12.5 mg by mouth daily.   HYDROcodone-acetaminophen 5-325 MG tablet Commonly known as: NORCO/VICODIN hydrocodone 5 mg-acetaminophen 325 mg tablet  Take 1 tablet twice a day by oral route as needed.   ketoconazole 2 % cream Commonly known as: NIZORAL Apply 1 application topically daily.   ProAir HFA 108 (90 Base) MCG/ACT inhaler Generic drug: albuterol Inhale 2 puffs into the lungs every 6 (six) hours as needed for wheezing or shortness of breath.   venlafaxine XR 37.5 MG 24 hr capsule Commonly known as: EFFEXOR-XR Take 37.5 mg by mouth daily.   zolpidem 10 MG tablet Commonly known as: AMBIEN Take 1 tablet (10 mg total) by mouth at bedtime as needed for sleep.        Allergies  Allergen Reactions   Lovastatin Swelling    Patient states that her tongue swells and she gets a tingling feeling all over. Patient states that her tongue swells and she gets a tingling feeling all over.   Lisinopril      Procedures /Studies:   CT HEAD WO CONTRAST (5MM)  Result Date: 12/14/2020 CLINICAL DATA:  Altered  mental status, slurred speech, hypotensive episode EXAM: CT HEAD WITHOUT CONTRAST TECHNIQUE: Contiguous axial images were obtained from the base of the skull through the vertex without intravenous contrast. COMPARISON:  None. FINDINGS: Brain: Normal anatomic configuration. Parenchymal volume is relatively well preserved for the patient's age. Bifrontal subcortical white matter infarcts are noted bilaterally, age indeterminate. No abnormal intra or extra-axial mass lesion or fluid collection. No abnormal mass effect or midline shift. No evidence of acute intracranial hemorrhage. Ventricular size is normal. Cerebellum unremarkable. Vascular: No asymmetric hyperdense vasculature at the skull base. Skull: Intact Sinuses/Orbits: Paranasal sinuses are clear. Orbits are unremarkable. Other: Mastoid air cells and middle ear cavities are clear. IMPRESSION: Small bifrontal subcortical white matter infarcts, age indeterminate. Correlation with patient's neurologic examination is recommended. MRI examination may be more helpful for further evaluation if there are concordant clinical symptoms. Electronically Signed   By: Fidela Salisbury M.D.   On: 12/14/2020 00:17   MR BRAIN WO CONTRAST  Result Date: 12/14/2020 CLINICAL DATA:  Neuro deficit, acute, stroke suspected EXAM: MRI HEAD WITHOUT CONTRAST TECHNIQUE: Multiplanar, multiecho pulse sequences of the brain and surrounding structures were obtained without intravenous contrast. COMPARISON:  None. FINDINGS: Brain: There is no acute infarction or intracranial hemorrhage. There is no intracranial mass, mass effect, or edema. There is no hydrocephalus or extra-axial fluid collection. Patchy foci of T2 hyperintensity in the supratentorial white matter are nonspecific but may reflect mild to moderate chronic microvascular ischemic changes. Small chronic right cerebellar infarct. Ventricles and sulci are within normal limits in size and configuration. Vascular: Major vessel flow  voids at the skull base are preserved. Skull and upper cervical spine: Normal marrow signal is preserved. Sinuses/Orbits: Paranasal sinuses are aerated. Orbits are unremarkable. Other: Sella is unremarkable.  Mastoid air cells are clear. IMPRESSION: No evidence of recent infarction, hemorrhage, or mass. 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 Small chronic right cerebellar infarct. Electronically Signed   By: Macy Mis M.D.   On: 12/14/2020 10:53   DG Chest Port 1 View  Result Date: 12/13/2020 CLINICAL DATA:  Weakness today. Hypotensive. Episode yesterday with slurred speech, weakness, and confusion. EXAM: PORTABLE CHEST 1 VIEW COMPARISON:  05/25/2020 FINDINGS: Shallow inspiration with atelectasis in the lung bases. No airspace disease or consolidation is suggested. No  pleural effusions. No pneumothorax. Mediastinal contours appear intact. Heart size is normal. Calcification of the aorta. Postoperative changes in both shoulders. IMPRESSION: Shallow inspiration with linear atelectasis in the lung bases. No active pulmonary disease. Electronically Signed   By: Lucienne Capers M.D.   On: 12/13/2020 23:27   ECHOCARDIOGRAM COMPLETE  Result Date: 12/14/2020    ECHOCARDIOGRAM REPORT   Patient Name:   YORLEY BUCH Date of Exam: 12/14/2020 Medical Rec #:  [id number]      Height:       62.5 in Accession #:    [id number]     Weight:       161.0 lb Date of Birth:  [birth year]      BSA:          1.754 m Patient Age:    80 years       BP:           135/72 mmHg Patient Gender: F              HR:           77 bpm. Exam Location:  [facility] Procedure: 2D Echo, Cardiac Doppler and Color Doppler Indications:    Stroke l63.9  History:        Patient has prior history of Echocardiogram examinations, most                 recent 07/29/2015. COPD; Risk Factors:Hypertension and                 Prediabetes. Stage 3a chronic kidney disease ([place]).  Sonographer:    Alvino Chapel RCS Referring Phys: [id number]  Gilman D [place]  1. Left ventricular ejection fraction, by estimation, is 50 to 55%. The left ventricle has low normal function. The left ventricle demonstrates regional wall motion abnormalities (see scoring diagram/findings for description). Left ventricular diastolic  parameters are consistent with Grade I diastolic dysfunction (impaired relaxation).  2. Right ventricular systolic function is normal. The right ventricular size is normal.  3. Left atrial size was mildly dilated.  4. The mitral valve is normal in structure. No evidence of mitral valve regurgitation. No evidence of mitral stenosis.  5. The aortic valve is tricuspid. Aortic valve regurgitation is not visualized. Mild aortic valve sclerosis is present, with no evidence of aortic valve stenosis.  6. The inferior vena cava is normal in size with greater than 50% respiratory variability, suggesting right atrial pressure of 3 mmHg. Conclusion(s)/Recommendation(s): No intracardiac source of embolism detected on this transthoracic study. A transesophageal echocardiogram is recommended to exclude cardiac source of embolism if clinically indicated. FINDINGS  Left Ventricle: Left ventricular ejection fraction, by estimation, is 50 to 55%. The left ventricle has low normal function. The left ventricle demonstrates regional wall motion abnormalities. Definity contrast agent was given IV to delineate the left ventricular endocardial borders. The left ventricular internal cavity size was normal in size. There is no left ventricular hypertrophy. Abnormal (paradoxical) septal motion, consistent with left bundle branch block. Left ventricular diastolic parameters  are consistent with Grade I diastolic dysfunction (impaired relaxation).  LV Wall Scoring: The mid inferoseptal segment is akinetic. The apical septal segment and apical anterior segment are normal. Right Ventricle: The right ventricular size is normal. No increase in right ventricular wall  thickness. Right ventricular systolic function is normal. Left Atrium: Left atrial size was mildly dilated. Right Atrium: Right atrial size was normal in size. Pericardium: There is no evidence of pericardial effusion. Mitral Valve:  The mitral valve is normal in structure. No evidence of mitral valve regurgitation. No evidence of mitral valve stenosis. Tricuspid Valve: The tricuspid valve is normal in structure. Tricuspid valve regurgitation is not demonstrated. No evidence of tricuspid stenosis. Aortic Valve: The aortic valve is tricuspid. Aortic valve regurgitation is not visualized. Mild aortic valve sclerosis is present, with no evidence of aortic valve stenosis. Pulmonic Valve: The pulmonic valve was normal in structure. Pulmonic valve regurgitation is not visualized. No evidence of pulmonic stenosis. Aorta: The aortic root is normal in size and structure. Venous: The inferior vena cava is normal in size with greater than 50% respiratory variability, suggesting right atrial pressure of 3 mmHg. IAS/Shunts: No atrial level shunt detected by color flow Doppler.  LEFT VENTRICLE PLAX 2D LVIDd:         4.50 cm LVIDs:         3.20 cm LV PW:         1.00 cm LV IVS:        1.00 cm LVOT diam:     1.80 cm LV SV:         53 LV SV Index:   30 LVOT Area:     2.54 cm  RIGHT VENTRICLE TAPSE (M-mode): 2.5 cm LEFT ATRIUM             Index        RIGHT ATRIUM           Index LA diam:        2.80 cm 1.60 cm/m   RA Area:     20.10 cm LA Vol (A2C):   37.9 ml 21.61 ml/m  RA Volume:   67.70 ml  38.60 ml/m LA Vol (A4C):   63.0 ml 35.92 ml/m LA Biplane Vol: 49.1 ml 28.00 ml/m  AORTIC VALVE LVOT Vmax:   99.80 cm/s LVOT Vmean:  69.700 cm/s LVOT VTI:    0.210 m  AORTA Ao Root diam: 3.00 cm MITRAL VALVE MV Area (PHT): 3.37 cm     SHUNTS MV Decel Time: 225 msec     Systemic VTI:  0.21 m MV E velocity: 71.30 cm/s   Systemic Diam: 1.80 cm MV A velocity: 136.00 cm/s MV E/A ratio:  0.52 Candee Furbish MD Electronically signed by Candee Furbish  MD Signature Date/Time: 12/14/2020/2:53:06 PM    Final     Subjective:   Patient was seen and examined 12/15/2020, 12:17 PM Patient stable today. No acute distress.  No issues overnight Stable for discharge.  Discharge Exam:    Vitals:   12/14/20 1729 12/14/20 2124 12/15/20 0606 12/15/20 0848  BP: 120/65 (!) 105/57 (!) 144/72   Pulse: 80 81 74   Resp: _0 Temp: 98 F (36.7 C) 98.5 F (36.9 C) 98.7 F (37.1 C)   TempSrc: Oral Oral Oral   SpO2: 99% 96% 95% 96%  Weight:      Height:        General: Pt lying comfortably in bed & appears in no obvious distress. Cardiovascular: S1 & S2 heard, RRR, S1/S2 +. No murmurs, rubs, gallops or clicks. No JVD or pedal edema. Respiratory: Clear to auscultation without wheezing, rhonchi or crackles. No increased work of breathing. Abdominal:  Non-distended, non-tender & soft. No organomegaly or masses appreciated. Normal bowel sounds heard. CNS: Alert and oriented. No focal deficits. Extremities: no edema, no cyanosis      The results of significant diagnostics from this hospitalization (including imaging, microbiology, ancillary and  laboratory) are listed below for reference.      Microbiology:   Recent Results (from the past 240 hour(s))  Urine Culture     Status: Abnormal   Collection Time: 12/14/20  1:39 AM   Specimen: Urine, Clean Catch  Result Value Ref Range Status   Specimen Description   Final    URINE, CLEAN CATCH Performed at [facility], [address] [place], [place] [address]    Special Requests   Final    NONE Performed at [facility], [address], [place], [place] [address]    Culture (A)  Final    1,000 COLONIES/mL GROUP B STREP(S.AGALACTIAE)ISOLATED TESTING AGAINST S. AGALACTIAE NOT ROUTINELY PERFORMED DUE TO PREDICTABILITY OF AMP/PEN/VAN SUSCEPTIBILITY. Performed at [facility], [place] [address], [place], [place] [address]    Report Status 12/15/2020 FINAL  Final  Resp Panel by RT-PCR  (Flu A&B, Covid) Nasopharyngeal Swab     Status: None   Collection Time: 12/14/20  1:50 AM   Specimen: Nasopharyngeal Swab; Nasopharyngeal(NP) swabs in vial transport medium  Result Value Ref Range Status   SARS Coronavirus 2 by RT PCR NEGATIVE NEGATIVE Final    Comment: (NOTE) SARS-CoV-2 target nucleic acids are NOT DETECTED.  The SARS-CoV-2 RNA is generally detectable in upper respiratory specimens during the acute phase of infection. The lowest concentration of SARS-CoV-2 viral copies this assay can detect is 138 copies/mL. A negative result does not preclude SARS-Cov-2 infection and should not be used as the sole basis for treatment or other patient management decisions. A negative result may occur with  improper specimen collection/handling, submission of specimen other than nasopharyngeal swab, presence of viral mutation(s) within the areas targeted by this assay, and inadequate number of viral copies(<138 copies/mL). A negative result must be combined with clinical observations, patient history, and epidemiological information. The expected result is Negative.  Fact Sheet for Patients:  EntrepreneurPulse.com.au  Fact Sheet for Healthcare Providers:  IncredibleEmployment.be  This test is no t yet approved or cleared by the [place] FDA and  has been authorized for detection and/or diagnosis of SARS-CoV-2 by FDA under an Emergency Use Authorization (EUA). This EUA will remain  in effect (meaning this test can be used) for the duration of the COVID-19 declaration under Section 564(b)(1) of the Act, 21 U.S.C.section 360bbb-3(b)(1), unless the authorization is terminated  or revoked sooner.       Influenza A by PCR NEGATIVE NEGATIVE Final   Influenza B by PCR NEGATIVE NEGATIVE Final    Comment: (NOTE) The Xpert Xpress SARS-CoV-2/FLU/RSV plus assay is intended as an aid in the diagnosis of influenza from Nasopharyngeal swab specimens  and should not be used as a sole basis for treatment. Nasal washings and aspirates are unacceptable for Xpert Xpress SARS-CoV-2/FLU/RSV testing.  Fact Sheet for Patients: EntrepreneurPulse.com.au  Fact Sheet for Healthcare Providers: IncredibleEmployment.be  This test is not yet approved or cleared by the [place] FDA and has been authorized for detection and/or diagnosis of SARS-CoV-2 by FDA under an Emergency Use Authorization (EUA). This EUA will remain in effect (meaning this test can be used) for the duration of the COVID-19 declaration under Section 564(b)(1) of the Act, 21 U.S.C. section 360bbb-3(b)(1), unless the authorization is terminated or revoked.  Performed at [facility], [address], [place], [place] [address]   Culture, blood (Routine X 2) w Reflex to ID Panel     Status: None (Preliminary result)   Collection Time: 12/14/20  2:10 AM  Specimen: BLOOD  Result Value Ref Range Status   Specimen Description BLOOD LEFT ANTECUBITAL  Final   Special Requests   Final    BOTTLES DRAWN AEROBIC AND ANAEROBIC Blood Culture adequate volume   Culture   Final    NO GROWTH 1 DAY Performed at [facility], [address] [place], [place] [address]    Report Status PENDING  Incomplete  Culture, blood (Routine X 2) w Reflex to ID Panel     Status: None (Preliminary result)   Collection Time: 12/14/20  2:10 AM   Specimen: BLOOD  Result Value Ref Range Status   Specimen Description BLOOD RIGHT ANTECUBITAL  Final   Special Requests   Final    BOTTLES DRAWN AEROBIC AND ANAEROBIC Blood Culture adequate volume   Culture   Final    NO GROWTH 1 DAY Performed at [facility], [address], [place],  [address]    Report Status PENDING  Incomplete     Labs:   CBC: Recent Labs  Lab 12/13/20 2313 12/15/20 0511  WBC 13.5* 4.8  NEUTROABS 9.7*  --   HGB 12.1 11.1*  HCT 37.2 35.6*  MCV 98.4 100.0  PLT 163 295   Basic  Metabolic Panel: Recent Labs  Lab 12/13/20 2313 12/15/20 0511  NA 135 143  K 3.5 3.9  CL 100 110  CO2 24 28  GLUCOSE 93 94  BUN 37* 27*  CREATININE 1.56* 1.11*  CALCIUM 9.0 8.6*  MG  --  1.7   Liver Function Tests: Recent Labs  Lab 12/13/20 2313  AST 22  ALT 18  ALKPHOS 69  BILITOT 0.7  PROT 6.8  ALBUMIN 3.8   BNP (last 3 results) Recent Labs    05/25/20 1250  BNP 43.0    Hgb A1c Recent Labs    12/13/20 2311  HGBA1C 5.8*   Lipid Profile Recent Labs    12/14/20 0120  CHOL 196  HDL 89  LDLCALC 92  TRIG 77  CHOLHDL 2.2    Urinalysis    Component Value Date/Time   COLORURINE YELLOW 12/14/2020 0051   APPEARANCEUR CLOUDY (A) 12/14/2020 0051   LABSPEC 1.014 12/14/2020 0051   PHURINE 5.0 12/14/2020 0051   GLUCOSEU NEGATIVE 12/14/2020 0051   HGBUR SMALL (A) 12/14/2020 0051   BILIRUBINUR NEGATIVE 12/14/2020 [place] 12/14/2020 0051   PROTEINUR 30 (A) 12/14/2020 0051   NITRITE NEGATIVE 12/14/2020 0051   LEUKOCYTESUR LARGE (A) 12/14/2020 0051         Time coordinating discharge: Over 45 minutes  SIGNED: Deatra Tipps, MD, FACP, FHM. [facility],  Please use [email or web address] to Page If 7PM-7AM, please contact night-coverage [email or web address] 12/15/2020, 12:17 PM

## 2020-12-15 NOTE — Care Management Obs Status (Signed)
MEDICARE OBSERVATION STATUS NOTIFICATION   Patient Details  Name: Heather [name] MRN: [id number] Date of Birth: 1940-04-22   Medicare Observation Status Notification Given:       Corey Harold 12/15/2020, 12:24 PM

## 2020-12-15 NOTE — Care Management Obs Status (Signed)
MEDICARE OBSERVATION STATUS NOTIFICATION   Patient Details  Name: Heather [name] MRN: [id number] Date of Birth: 1940-06-09   Medicare Observation Status Notification Given:  Yes    Corey Harold 12/15/2020, 12:25 PM

## 2020-12-19 LAB — CULTURE, BLOOD (ROUTINE X 2)
Culture: NO GROWTH
Culture: NO GROWTH
Special Requests: ADEQUATE
Special Requests: ADEQUATE

## 2021-01-07 ENCOUNTER — Telehealth: Payer: Self-pay | Admitting: Pulmonary Disease

## 2021-01-07 DIAGNOSIS — G4730 Sleep apnea, unspecified: Secondary | ICD-10-CM

## 2021-01-07 NOTE — Telephone Encounter (Signed)
ONO with RA 12/30/20 >> test time 7 hrs 29 min.  Baseline SpO2 92%, low SpO2 80%.  Spent 4 min with SpO2 < 88%.  Please let her know her oxygen intermittently gets low during the night.  This could be from sleep apnea.  If she is agreeable, then please arrange for home sleep study.

## 2021-01-10 ENCOUNTER — Ambulatory Visit: Payer: Medicare HMO | Admitting: Podiatry

## 2021-01-12 NOTE — Telephone Encounter (Signed)
Spoke to patient and went over results with her. She was agreeable to a home sleep study. I did let her know that currently HST are booked 10 weeks out per the PCCs. She voiced understanding. Nothing further needed at this time. Order placed.

## 2021-01-17 ENCOUNTER — Telehealth: Payer: Self-pay | Admitting: *Deleted

## 2021-01-17 NOTE — Telephone Encounter (Signed)
Patient is calling because she thinks that she may have been given the wrong prescription(non formulary anti fungal nail solution),said that it is for the toenails not the skin and she was seen for a skin condition.she was supposed to have been prescribed (Ketoconazole 2% )Please advise.

## 2021-01-18 ENCOUNTER — Other Ambulatory Visit: Payer: Self-pay | Admitting: Podiatry

## 2021-01-18 MED ORDER — KETOCONAZOLE 2 % EX CREA: 1.0000 "application " | TOPICAL_CREAM | Freq: Every day | CUTANEOUS | 2 refills | Status: DC

## 2021-01-18 NOTE — Telephone Encounter (Signed)
Returned the call to patient,notified of change.

## 2021-01-31 ENCOUNTER — Other Ambulatory Visit: Payer: Self-pay

## 2021-01-31 ENCOUNTER — Other Ambulatory Visit: Payer: Self-pay | Admitting: Internal Medicine

## 2021-01-31 ENCOUNTER — Encounter: Payer: Self-pay | Admitting: Internal Medicine

## 2021-01-31 ENCOUNTER — Ambulatory Visit: Payer: Medicare HMO | Admitting: Internal Medicine

## 2021-01-31 DIAGNOSIS — R0609 Other forms of dyspnea: Secondary | ICD-10-CM

## 2021-01-31 DIAGNOSIS — M7989 Other specified soft tissue disorders: Secondary | ICD-10-CM | POA: Diagnosis not present

## 2021-01-31 DIAGNOSIS — J449 Chronic obstructive pulmonary disease, unspecified: Secondary | ICD-10-CM | POA: Diagnosis not present

## 2021-01-31 NOTE — Patient Instructions (Addendum)
Elevate legs as much as possible and follow up with Dr Kirtland Bouchard regarding your fluid pills   Please remember to go to the lab department   for your tests - we will call you with the results when they are available.      We will schedule venous dopplers  Please schedule a follow up visit in 3 months but call sooner if needed

## 2021-01-31 NOTE — Assessment & Plan Note (Addendum)
chronic x years but much worse since 01/24/21 -  Venous dopplers 01/31/2021 >>>  May need to modifiy bp rx and reduce/ eliminate amlodipine     >>>>Check labs/ venous dopplers, elevate and f/u with PCP as planned         Each maintenance medication was reviewed in detail including emphasizing most importantly the difference between maintenance and prns and under what circumstances the prns are to be triggered using an action plan format where appropriate.  Total time for H and P, chart review, counseling, reviewing hfa device(s) and generating customized AVS unique to this office visit / same day charting = 30 min

## 2021-01-31 NOTE — Assessment & Plan Note (Signed)
Onset of sob around 2000 p quits smoking 1987  - 05/25/2020  After extensive coaching inhaler device,  effectiveness =    0-75% with smi > continue stiolto 2 each am  - Allergy profile 05/25/20  >  Eos 0.1 /  IgE  4  - PFT's   06/29/20  FEV1 1.40 (97 % ) ratio 0.68  p 2 % improvement from saba p stiolto prior to study with DLCO  10.28 (58%) corrects to 2.60 (62%)  for alv volume and FV curve mild curvature    - 07/19/2020    try breztri 2bid  - 11/01/2020  After extensive coaching inhaler device,  effectiveness =    90% continue breztri, add h2 h1 hs    Group D in terms of symptom/risk and laba/lama/ICS  therefore appropriate rx at this point >>>  Continue breztri and approp saba   Re SABA :  I spent extra time with pt today reviewing appropriate use of albuterol for prn use on exertion with the following points: 1) saba is for relief of sob that does not improve by walking a slower pace or resting but rather if the pt does not improve after trying this first. 2) If the pt is convinced, as many are, that saba helps recover from activity faster then it's easy to tell if this is the case by re-challenging : ie stop, take the inhaler, then p 5 minutes try the exact same activity (intensity of workload) that just caused the symptoms and see if they are substantially diminished or not after saba 3) if there is an activity that reproducibly causes the symptoms, try the saba 15 min before the activity on alternate days   If in fact the saba really does help, then fine to continue to use it prn but advised may need to look closer at the maintenance regimen being used to achieve better control of airways disease with exertion.

## 2021-01-31 NOTE — Progress Notes (Signed)
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    H/o blood clot in R leg and some leg swelling since always worse on the R    History of Present Illness  05/25/2020  Pulmonary/ 1st office eval/ Shatha Hooser / [facility]  Chief Complaint  Patient presents with   Pulmonary Consult    Referred by Dr. Bertram Savin.  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 She c/o cough x 6 months. Cough is occ prod with light green to yellow sputum.  She has DOE walking up stairs. She uses proair 4-5 x per wk.   Dyspnea:  [employer] 1-2 aisles x one year  Cough: x 6 mo/ assoc hoarseness  Sleep: bed is flat/ 2 pillows no problem SABA use: poor techniuque/ hoarse worse with dpi  rec Plan A = Automatic = Always=    Stiolto 2 pffs 1st thing each am  Work on inhaler technique: Remember to do the practice breaths first  Plan B = Backup (to supplement plan A, not to replace it) Only use your albuterol inhaler as a rescue medication zpak should turn mucus white Prilosec 20 mg Take 30- 60 min before your first and last meals of the day  Please schedule a follow up office visit in 4 weeks, sooner if needed with pfts     07/19/2020  f/u ov/[place] office/Qamar Aughenbaugh re:  GOLD I  No chief complaint on file. Dyspnea: easier to shop since started stiolto / wants to know if can change to breztri same benefit  Cough: better  Sleeping: bed is flat / 2 pillows  SABA use: rarely  02: none  Covid status: vax x 3  Lung cancer screening: n/a  Rec Plan A = Automatic = Always=   Breztri Take 2 puffs first thing in am and then another 2 puffs about 12 hours later.  Work on inhaler technique:    Plan B = Backup (to supplement plan A, not to replace it) Only use your albuterol inhaler as a rescue medication    11/01/2020  f/u  ov/[place] office/Sevon Rotert re: GOLD I copd  maint on breztri   No chief complaint on file.  Dyspnea:  [employer] ok / uses [facility] parking due to breathing and arthritis back and hips  Cough: mostly noct / non productive Sleeping: 2 pillows  / no bed blocks yet SABA use: once a week noct, never daytime  02: none Covid status: x 4  Lung cancer screening: n/a  Rec Add pepcid 20 mg after supper and start using bed blocks  If still coughing at night >  add CHLORPHENIRAMINE  4 mg   GERD diet/ lifestyle.  Please schedule a follow up visit in 3 months but call sooner if needed  with all medications /inhalers/ solutions in hand       01/31/2021  f/u ov/ office/Jarry Manon re: GOLD 1 copd  maint on Breztri 2bid   Chief Complaint  Patient presents with   Follow-up    Feels cough and SOB have improved since last Ov. Right leg swelling since Monday of last week.   Dyspnea:  walking at [employer] before sob/ back / legs give out all the same time x slow pace Cough: gone  Sleeping: on bed  blocks no resp cc  SABA use: 3 -4 x per week 02: none  Covid status: vax x 4 including new bivalent  Chronic leg swelling worst ever/ rx by pcp   No obvious day to day or daytime variability or assoc excess/ purulent sputum or mucus plugs or hemoptysis or cp or chest tightness, subjective wheeze or overt sinus or hb symptoms.   Sleeping  without nocturnal  or early am exacerbation  of respiratory  c/o's or need for noct saba. Also denies any obvious fluctuation of symptoms with weather or environmental changes or other aggravating or alleviating factors except as outlined above   No unusual exposure hx or h/o childhood pna/ asthma or knowledge of premature birth.  Current Allergies, Complete Past Medical History, Past Surgical History, Family History, and Social History were reviewed in [employer] record.  ROS  The following are not active complaints unless bolded Hoarseness, sore throat,  dysphagia, dental problems, itching, sneezing,  nasal congestion or discharge of excess mucus or purulent secretions, ear ache,   fever, chills, sweats, unintended wt loss or wt gain, classically pleuritic or exertional cp,  orthopnea pnd or arm/hand swelling  or leg swelling, presyncope, palpitations, abdominal pain, anorexia, nausea, vomiting, diarrhea  or change in bowel habits or change in bladder habits, change in stools or change in urine, dysuria, hematuria,  rash, arthralgias, visual complaints, headache, numbness, weakness or ataxia or problems with walking or coordination,  change in mood or  memory.        Current Meds  Medication Sig   allopurinol (ZYLOPRIM) 300 MG tablet Take 0.5 tablets (150 mg total) by mouth daily.   amlodipine-olmesartan (AZOR) 10-20 MG tablet Take 1 tablet by mouth daily.   aspirin EC 81 MG tablet Take 1 tablet (81 mg total) by mouth daily. Swallow whole.   atorvastatin (LIPITOR) 10 MG tablet Take 1 tablet (10 mg total) by mouth daily.   Budeson-Glycopyrrol-Formoterol (BREZTRI AEROSPHERE) 160-9-4.8 MCG/ACT AERO Inhale 2 puffs into the lungs 2 (two) times daily.   Cholecalciferol (DIALYVITE VITAMIN D 5000 PO) Take by mouth.   famotidine (PEPCID) 20 MG tablet One after supper   hydrochlorothiazide (MICROZIDE) 12.5 MG capsule Take 12.5 mg by mouth daily.   HYDROcodone-acetaminophen (NORCO/VICODIN) 5-325 MG tablet hydrocodone 5 mg-acetaminophen 325 mg tablet  Take 1 tablet twice a day by oral route as needed.   ketoconazole (NIZORAL) 2 % cream Apply 1 application topically daily.   PROAIR HFA 108 (90 BASE) MCG/ACT inhaler Inhale 2 puffs into the lungs every 6 (six) hours as needed for wheezing or shortness of breath.    venlafaxine XR (EFFEXOR-XR) 37.5 MG 24 hr capsule Take 37.5 mg by mouth daily.               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    NECK :  without JVD/Nodes/TM/ nl carotid upstrokes bilaterally   LUNGS: no acc muscle use,  Nl contour chest which is clear to A and P bilaterally without cough on insp or exp maneuvers   CV:  RRR  no s3 or murmur or increase in P2, and  4+ ptting on R vs 3 on L with elastic knee high  ABD:  soft and nontender with nl inspiratory excursion in the supine position. No bruits or organomegaly appreciated, bowel sounds nl  MS:  Nl gait/ ext warm without deformities, calf tenderness, cyanosis or clubbing No obvious joint restrictions   SKIN: warm and dry without lesions    NEURO:  alert, approp, nl sensorium with  no motor or cerebellar deficits apparent.       I personally reviewed images and agree with radiology impression as follows:  CXR:   portable 12/13/20  Shallow inspiration with linear atelectasis in the lung bases. No active pulmonary disease.  Labs ordered/ reviewed:      Chemistry      Component Value Date/Time   NA 141 01/31/2021 0955   K 4.2 01/31/2021 0955   CL 103 01/31/2021 0955   CO2 24 01/31/2021 0955   BUN 25 01/31/2021 0955   CREATININE 1.06 (H) 01/31/2021 0955      Component Value Date/Time   CALCIUM 9.6 01/31/2021 0955   ALKPHOS 84 01/31/2021 0955   AST 25 01/31/2021 0955   ALT 17 01/31/2021 0955   BILITOT 0.5 01/31/2021 0955     Albumin                       4.2              01/31/2021       Lab Results  Component Value Date   WBC 4.1 01/31/2021   HGB 11.3 01/31/2021    HCT 35.2 01/31/2021   MCV 98 (H) 01/31/2021   PLT 193 01/31/2021       EOS                                0.3                                   01/31/2021   No results found for: DDIMER    Lab Results  Component Value Date   TSH 1.660 01/31/2021       BNP   01/31/2021       50               Assessment

## 2021-02-01 LAB — CBC WITH DIFFERENTIAL/PLATELET
Basophils Absolute: 0.1 10*3/uL (ref 0.0–0.2)
Basos: 2 %
EOS (ABSOLUTE): 0.3 10*3/uL (ref 0.0–0.4)
Eos: 8 %
Hematocrit: 35.2 % (ref 34.0–46.6)
Hemoglobin: 11.3 g/dL (ref 11.1–15.9)
Immature Grans (Abs): 0 10*3/uL (ref 0.0–0.1)
Immature Granulocytes: 0 %
Lymphocytes Absolute: 1.1 10*3/uL (ref 0.7–3.1)
Lymphs: 28 %
MCH: 31.3 pg (ref 26.6–33.0)
MCHC: 32.1 g/dL (ref 31.5–35.7)
MCV: 98 fL — ABNORMAL HIGH (ref 79–97)
Monocytes Absolute: 0.5 10*3/uL (ref 0.1–0.9)
Monocytes: 11 %
Neutrophils Absolute: 2.1 10*3/uL (ref 1.4–7.0)
Neutrophils: 51 %
Platelets: 193 10*3/uL (ref 150–450)
RBC: 3.61 x10E6/uL — ABNORMAL LOW (ref 3.77–5.28)
RDW: 13.3 % (ref 11.7–15.4)
WBC: 4.1 10*3/uL (ref 3.4–10.8)

## 2021-02-01 LAB — BASIC METABOLIC PANEL
BUN/Creatinine Ratio: 24 (ref 12–28)
BUN: 25 mg/dL (ref 8–27)
CO2: 24 mmol/L (ref 20–29)
Calcium: 9.6 mg/dL (ref 8.7–10.3)
Chloride: 103 mmol/L (ref 96–106)
Creatinine, Ser: 1.06 mg/dL — ABNORMAL HIGH (ref 0.57–1.00)
Glucose: 86 mg/dL (ref 70–99)
Potassium: 4.2 mmol/L (ref 3.5–5.2)
Sodium: 141 mmol/L (ref 134–144)
eGFR: 53 mL/min/{1.73_m2} — ABNORMAL LOW (ref >59)

## 2021-02-01 LAB — HEPATIC FUNCTION PANEL
ALT: 17 IU/L (ref 0–32)
AST: 25 IU/L (ref 0–40)
Albumin: 4.2 g/dL (ref 3.7–4.7)
Alkaline Phosphatase: 84 IU/L (ref 44–121)
Bilirubin Total: 0.5 mg/dL (ref 0.0–1.2)
Bilirubin, Direct: 0.15 mg/dL (ref 0.00–0.40)
Total Protein: 6.2 g/dL (ref 6.0–8.5)

## 2021-02-01 LAB — TSH: TSH: 1.66 u[IU]/mL (ref 0.450–4.500)

## 2021-02-01 LAB — BRAIN NATRIURETIC PEPTIDE: BNP: 50.2 pg/mL (ref 0.0–100.0)

## 2021-02-02 ENCOUNTER — Encounter: Payer: Self-pay | Admitting: Internal Medicine

## 2021-02-02 NOTE — Assessment & Plan Note (Addendum)
Onset of sob around 2000 p quits smoking 1987     No evidence of chf/ anemia / thyroid dz > see copd   Need to r/o DVT given asymetric leg swelling but no evidence clinically of PE

## 2021-02-21 ENCOUNTER — Ambulatory Visit (INDEPENDENT_AMBULATORY_CARE_PROVIDER_SITE_OTHER): Payer: Medicare HMO

## 2021-02-21 ENCOUNTER — Telehealth: Payer: Self-pay | Admitting: Internal Medicine

## 2021-02-21 DIAGNOSIS — M7989 Other specified soft tissue disorders: Secondary | ICD-10-CM

## 2021-02-21 DIAGNOSIS — R600 Localized edema: Secondary | ICD-10-CM | POA: Diagnosis not present

## 2021-02-22 NOTE — Telephone Encounter (Signed)
Noted. See this also documented in pt's doppler study results. Will route to Dr. Sherene Sires as an Lorain Childes.

## 2021-02-27 ENCOUNTER — Emergency Department (HOSPITAL_COMMUNITY)
Admission: EM | Admit: 2021-02-27 | Discharge: 2021-02-27 | Disposition: A | Discharge status: Home / Self Care | Payer: Medicare HMO | Attending: Emergency Medicine | Admitting: Emergency Medicine

## 2021-02-27 ENCOUNTER — Other Ambulatory Visit: Payer: Self-pay

## 2021-02-27 ENCOUNTER — Emergency Department (HOSPITAL_COMMUNITY): Payer: Medicare HMO

## 2021-02-27 ENCOUNTER — Encounter (HOSPITAL_COMMUNITY): Payer: Self-pay | Admitting: *Deleted

## 2021-02-27 DIAGNOSIS — J449 Chronic obstructive pulmonary disease, unspecified: Secondary | ICD-10-CM | POA: Diagnosis not present

## 2021-02-27 DIAGNOSIS — R519 Headache, unspecified: Secondary | ICD-10-CM | POA: Insufficient documentation

## 2021-02-27 DIAGNOSIS — I129 Hypertensive chronic kidney disease with stage 1 through stage 4 chronic kidney disease, or unspecified chronic kidney disease: Secondary | ICD-10-CM | POA: Insufficient documentation

## 2021-02-27 DIAGNOSIS — Z79899 Other long term (current) drug therapy: Secondary | ICD-10-CM | POA: Insufficient documentation

## 2021-02-27 DIAGNOSIS — Z96643 Presence of artificial hip joint, bilateral: Secondary | ICD-10-CM | POA: Insufficient documentation

## 2021-02-27 DIAGNOSIS — Z7951 Long term (current) use of inhaled steroids: Secondary | ICD-10-CM | POA: Insufficient documentation

## 2021-02-27 DIAGNOSIS — R600 Localized edema: Secondary | ICD-10-CM | POA: Insufficient documentation

## 2021-02-27 DIAGNOSIS — Z7982 Long term (current) use of aspirin: Secondary | ICD-10-CM | POA: Diagnosis not present

## 2021-02-27 DIAGNOSIS — J029 Acute pharyngitis, unspecified: Secondary | ICD-10-CM | POA: Diagnosis not present

## 2021-02-27 DIAGNOSIS — Z20822 Contact with and (suspected) exposure to covid-19: Secondary | ICD-10-CM | POA: Insufficient documentation

## 2021-02-27 DIAGNOSIS — R059 Cough, unspecified: Secondary | ICD-10-CM | POA: Insufficient documentation

## 2021-02-27 DIAGNOSIS — R531 Weakness: Secondary | ICD-10-CM | POA: Diagnosis not present

## 2021-02-27 DIAGNOSIS — M791 Myalgia, unspecified site: Secondary | ICD-10-CM | POA: Insufficient documentation

## 2021-02-27 DIAGNOSIS — Z96612 Presence of left artificial shoulder joint: Secondary | ICD-10-CM | POA: Diagnosis not present

## 2021-02-27 DIAGNOSIS — R000 Tachycardia, unspecified: Secondary | ICD-10-CM | POA: Insufficient documentation

## 2021-02-27 DIAGNOSIS — Z96651 Presence of right artificial knee joint: Secondary | ICD-10-CM | POA: Insufficient documentation

## 2021-02-27 DIAGNOSIS — R110 Nausea: Secondary | ICD-10-CM | POA: Diagnosis not present

## 2021-02-27 DIAGNOSIS — Z87891 Personal history of nicotine dependence: Secondary | ICD-10-CM | POA: Insufficient documentation

## 2021-02-27 DIAGNOSIS — N1831 Chronic kidney disease, stage 3a: Secondary | ICD-10-CM | POA: Diagnosis not present

## 2021-02-27 DIAGNOSIS — R509 Fever, unspecified: Secondary | ICD-10-CM | POA: Diagnosis not present

## 2021-02-27 LAB — COMPREHENSIVE METABOLIC PANEL
ALT: 21 U/L (ref 0–44)
AST: 28 U/L (ref 15–41)
Albumin: 4.2 g/dL (ref 3.5–5.0)
Alkaline Phosphatase: 76 U/L (ref 38–126)
Anion gap: 12 (ref 5–15)
BUN: 25 mg/dL — ABNORMAL HIGH (ref 8–23)
CO2: 18 mmol/L — ABNORMAL LOW (ref 22–32)
Calcium: 9.3 mg/dL (ref 8.9–10.3)
Chloride: 107 mmol/L (ref 98–111)
Creatinine, Ser: 0.9 mg/dL (ref 0.44–1.00)
GFR, Estimated: >60 mL/min (ref >60)
Glucose, Bld: 137 mg/dL — ABNORMAL HIGH (ref 70–99)
Potassium: 3.5 mmol/L (ref 3.5–5.1)
Sodium: 137 mmol/L (ref 135–145)
Total Bilirubin: 1.2 mg/dL (ref 0.3–1.2)
Total Protein: 6.9 g/dL (ref 6.5–8.1)

## 2021-02-27 LAB — CBC WITH DIFFERENTIAL/PLATELET
Abs Immature Granulocytes: 0.08 10*3/uL — ABNORMAL HIGH (ref 0.00–0.07)
Basophils Absolute: 0.1 10*3/uL (ref 0.0–0.1)
Basophils Relative: 0 %
Eosinophils Absolute: 0.5 10*3/uL (ref 0.0–0.5)
Eosinophils Relative: 3 %
HCT: 38.1 % (ref 36.0–46.0)
Hemoglobin: 12.4 g/dL (ref 12.0–15.0)
Immature Granulocytes: 1 %
Lymphocytes Relative: 3 %
Lymphs Abs: 0.4 10*3/uL — ABNORMAL LOW (ref 0.7–4.0)
MCH: 32 pg (ref 26.0–34.0)
MCHC: 32.5 g/dL (ref 30.0–36.0)
MCV: 98.4 fL (ref 80.0–100.0)
Monocytes Absolute: 0.8 10*3/uL (ref 0.1–1.0)
Monocytes Relative: 5 %
Neutro Abs: 13.2 10*3/uL — ABNORMAL HIGH (ref 1.7–7.7)
Neutrophils Relative %: 88 %
Platelets: 175 10*3/uL (ref 150–400)
RBC: 3.87 MIL/uL (ref 3.87–5.11)
RDW: 14.2 % (ref 11.5–15.5)
WBC: 14.9 10*3/uL — ABNORMAL HIGH (ref 4.0–10.5)
nRBC: 0 % (ref 0.0–0.2)

## 2021-02-27 LAB — URINALYSIS, ROUTINE W REFLEX MICROSCOPIC
Bilirubin Urine: NEGATIVE
Glucose, UA: NEGATIVE mg/dL
Ketones, ur: NEGATIVE mg/dL
Nitrite: NEGATIVE
Protein, ur: 30 mg/dL — AB
Specific Gravity, Urine: 1.025 (ref 1.005–1.030)
pH: 5.5 (ref 5.0–8.0)

## 2021-02-27 LAB — URINALYSIS, MICROSCOPIC (REFLEX)

## 2021-02-27 LAB — RESP PANEL BY RT-PCR (FLU A&B, COVID) ARPGX2
Influenza A by PCR: NEGATIVE
Influenza B by PCR: NEGATIVE
SARS Coronavirus 2 by RT PCR: NEGATIVE

## 2021-02-27 LAB — LIPASE, BLOOD: Lipase: 28 U/L (ref 11–51)

## 2021-02-27 MED ORDER — ACETAMINOPHEN 325 MG PO TABS
650.0000 mg | ORAL_TABLET | Freq: Four times a day (QID) | ORAL | Status: DC | PRN
  Administered 2021-02-27: 14:00:00 650 mg via ORAL
  Filled 2021-02-27: qty 2

## 2021-02-27 MED ORDER — SODIUM CHLORIDE 0.9 % IV BOLUS
1000.0000 mL | Freq: Once | INTRAVENOUS | Status: AC
  Administered 2021-02-27: 14:00:00 1000 mL via INTRAVENOUS

## 2021-02-27 NOTE — ED Provider Notes (Signed)
Medical screening examination/treatment/procedure(s) were conducted as a shared visit with non-physician practitioner(s) and myself.  I personally evaluated the patient during the encounter.  Clinical Impression:   Final diagnoses:  None   Patient is a well-appearing 80 year old female presenting with normal vital signs.  She does report that she had a temperature of 100.4 prehospital, she woke up with a headache and body aches and a bit of a sore throat with an occasional cough today.  At this time she feels almost completely better.  On my exam she has a clear oropharynx, clear pupils, clear conjunctive a, no lymphadenopathy of her neck, neck is very supple, normal mental status and neurologic exam, follows commands without difficulty.  Lungs are clear, heart is regular, pulse of 90, good pulses at the radial arteries and no edema.  She has an isolated leukocytosis which is of questionable significance since rest of her tests are unremarkable.  Chest x-ray negative for infection, labs show no COVID or flu, urine without obvious infection, metabolic panel reassuring.  Patient agreeable, stable for discharge.     Eber Hong, MD 02/28/21 [phone]

## 2021-02-27 NOTE — ED Triage Notes (Signed)
Headache with body aches

## 2021-02-27 NOTE — ED Provider Notes (Signed)
[facility] EMERGENCY DEPARTMENT Provider Note   CSN: [id number] Arrival date & time: 02/27/21  1319     History Chief Complaint  Patient presents with   Weakness    Heather [name] is a 80 y.o. female. Patient presents the emergency department with headache that she has had since this morning.  She has had body aches as well all day.  She says she had a temperature of 100.4 at home.  She does feel like she has a mild sore throat and a cough that is worse than her typical.  Cough has been productive with whitish-yellow sputum.  He does have a history of COPD, but denies any shortness of breath, chest pain.  Patient has some epigastric abdominal tenderness and some mild nausea as well.  Denies any vomiting, diarrhea, constipation.  Patient denies any dysuria, hematuria.  HPI     Past Medical History:  Diagnosis Date   Anemia    Anxiety    Arthritis    COPD (chronic obstructive pulmonary disease) ([place])    Depression    Dry eyes 04/14/2014   GERD (gastroesophageal reflux disease)    Glaucoma    Gout    Gout 10/23/2018   HTN (hypertension)     Patient Active Problem List   Diagnosis Date Noted   Leg swelling 01/31/2021   Acute kidney injury ([place]) 12/14/2020   Tremor 06/21/2020   DOE (dyspnea on exertion) 05/25/2020   Stage 3a chronic kidney disease ([place]) 04/09/2020   Leg edema 02/03/2020   AKI (acute kidney injury) ([place]) 01/16/2020   Prediabetes 01/16/2020   Encounter to establish care 01/13/2020   Hyperlipidemia 01/13/2020   Anxiety 01/13/2020   COPD GOLD 1  01/13/2020   Gastroesophageal reflux disease 01/13/2020   Intertrigo 01/13/2020   Long-term current use of opiate analgesic 07/07/2019   Sacroiliac joint pain 07/07/2019   Bilateral carpal tunnel syndrome 05/10/2019   Chronic low back pain 05/10/2019   Lumbosacral radiculopathy 05/10/2019   Polyarthropathy 05/10/2019   Gout 10/24/2018   Essential hypertension 04/17/2014   Renal insufficiency 04/01/2014    Rotator cuff arthropathy     Past Surgical History:  Procedure Laterality Date   ABDOMINAL HYSTERECTOMY     BACK SURGERY     lumbar-disc   BUNIONECTOMY Bilateral    FOOT SURGERY Left    repair of "kissing Cousins"   JOINT REPLACEMENT Bilateral    hip    JOINT REPLACEMENT Right 2010 or 2012   knee   KNEE SURGERY     SHOULDER ARTHROSCOPY Right    shoulder surgery in [place] about 2000   SHOULDER HEMI-ARTHROPLASTY Left 03/25/2014   Procedure: LEFT SHOULDER HEMI-ARTHROPLASTY;  Surgeon: Carole Civil, MD;  Location: [facility];  Service: Orthopedics;  Laterality: Left;     OB History   No obstetric history on file.     Family History  Problem Relation Age of Onset   High blood pressure Mother    Aneurysm Father    Diabetes Brother    Pancreatitis Brother    Hypertension Niece    Congestive Heart Failure Niece     Social History   Tobacco Use   Smoking status: Former    Packs/day: 1.00    Years: 40.00    Pack years: 40.00    Types: Cigarettes    Quit date: 03/20/1985    Years since quitting: 35.9   Smokeless tobacco: Never  Substance Use Topics   Alcohol use: No   Drug use:  No    Home Medications Prior to Admission medications   Medication Sig Start Date End Date Taking? Authorizing Provider  atorvastatin (LIPITOR) 10 MG tablet Take 1 tablet by mouth every morning. 02/09/21  Yes [provider]  famotidine (PEPCID) 20 MG tablet Take by mouth. 11/01/20  Yes [provider]  ketoconazole (NIZORAL) 2 % cream Apply topically. 12/13/20  Yes [provider]  allopurinol (ZYLOPRIM) 300 MG tablet Take 0.5 tablets (150 mg total) by mouth daily. 02/25/20   Lindell Spar, MD  amlodipine-olmesartan (AZOR) 10-20 MG tablet Take 1 tablet by mouth daily.    [provider]  aspirin EC 81 MG tablet Take 1 tablet (81 mg total) by mouth daily. Swallow whole. 12/15/20 12/15/21  Shahmehdi, Valeria Batman, MD  atorvastatin (LIPITOR) 10 MG tablet Take 1  tablet (10 mg total) by mouth daily. 01/26/20   Lindell Spar, MD  Budeson-Glycopyrrol-Formoterol (BREZTRI AEROSPHERE) 160-9-4.8 MCG/ACT AERO Inhale 2 puffs into the lungs 2 (two) times daily. 07/27/20   Tanda Rockers, MD  Cholecalciferol (DIALYVITE VITAMIN D 5000 PO) Take by mouth.    [provider]  Cholecalciferol 1.25 MG (50000 UT) TABS Take by mouth.    [provider]  DULoxetine (CYMBALTA) 60 MG capsule Take 1 capsule (60 mg total) by mouth daily. 12/16/20 01/15/21  Deatra Ancrum, MD  famotidine (PEPCID) 20 MG tablet One after supper 11/01/20   Tanda Rockers, MD  hydrochlorothiazide (MICROZIDE) 12.5 MG capsule Take 12.5 mg by mouth daily.    [provider]  HYDROcodone-acetaminophen (NORCO/VICODIN) 5-325 MG tablet hydrocodone 5 mg-acetaminophen 325 mg tablet  Take 1 tablet twice a day by oral route as needed.    [provider]  ketoconazole (NIZORAL) 2 % cream Apply 1 application topically daily. 01/18/21   Lorenda Peck, MD  PROAIR HFA 108 (90 BASE) MCG/ACT inhaler Inhale 2 puffs into the lungs every 6 (six) hours as needed for wheezing or shortness of breath.  02/15/14   [provider]  venlafaxine XR (EFFEXOR-XR) 37.5 MG 24 hr capsule Take 37.5 mg by mouth daily. 11/29/20   [provider]  zolpidem (AMBIEN) 10 MG tablet Take 1 tablet (10 mg total) by mouth at bedtime as needed for sleep. 12/07/20 01/06/21  Chesley Mires, MD    Allergies    Lovastatin and Lisinopril  Review of Systems   Review of Systems  Constitutional:  Positive for fatigue and fever. Negative for chills.  HENT:  Positive for sore throat. Negative for congestion and rhinorrhea.   Eyes:  Negative for visual disturbance.  Respiratory:  Positive for cough. Negative for chest tightness and shortness of breath.   Cardiovascular:  Negative for chest pain, palpitations and leg swelling.  Gastrointestinal:  Positive for nausea. Negative for abdominal pain,  blood in stool, constipation, diarrhea and vomiting.  Genitourinary:  Negative for dysuria, flank pain and hematuria.  Musculoskeletal:  Positive for myalgias. Negative for back pain.  Skin:  Negative for rash and wound.  Neurological:  Negative for dizziness, syncope, weakness, light-headedness and headaches.  Psychiatric/Behavioral:  Negative for confusion.   All other systems reviewed and are negative.  Physical Exam Updated Vital Signs BP 110/60    Pulse 92    Temp 99.2 F (37.3 C)    Resp 13    SpO2 98%   Physical Exam Vitals and nursing note reviewed.  Constitutional:      General: She is not in acute distress.  Appearance: Normal appearance. She is not ill-appearing, toxic-appearing or diaphoretic.  HENT:     Head: Normocephalic and atraumatic.     Nose: No nasal deformity, congestion or rhinorrhea.     Mouth/Throat:     Lips: Pink. No lesions.     Mouth: Mucous membranes are dry. No injury, lacerations, oral lesions or angioedema.     Pharynx: Oropharynx is clear. Uvula midline. No pharyngeal swelling, oropharyngeal exudate, posterior oropharyngeal erythema or uvula swelling.     Tonsils: No tonsillar exudate or tonsillar abscesses.     Comments: Some dry mucous membranes.  Patient with no erythema to posterior pharynx.  No tonsillar swelling or exudate.  Uvula midline. Eyes:     General: Gaze aligned appropriately. No scleral icterus.       Right eye: No discharge.        Left eye: No discharge.     Conjunctiva/sclera: Conjunctivae normal.     Right eye: Right conjunctiva is not injected. No exudate or hemorrhage.    Left eye: Left conjunctiva is not injected. No exudate or hemorrhage. Cardiovascular:     Rate and Rhythm: Regular rhythm. Tachycardia present.     Pulses: Normal pulses.          Radial pulses are 2+ on the right side and 2+ on the left side.       Dorsalis pedis pulses are 2+ on the right side and 2+ on the left side.     Heart sounds: Normal heart  sounds, S1 normal and S2 normal. Heart sounds not distant. No murmur heard.   No friction rub. No gallop. No S3 or S4 sounds.     Comments: Bilateral leg swelling. No pitting. This is chronic per patient. Pulmonary:     Effort: Pulmonary effort is normal. No accessory muscle usage or respiratory distress.     Breath sounds: Normal breath sounds. No stridor. No wheezing, rhonchi or rales.     Comments: Lungs clear to auscultation bilateral with no wheezing. Chest:     Chest wall: No tenderness.  Abdominal:     General: Abdomen is flat. Bowel sounds are normal. There is no distension.     Palpations: Abdomen is soft. There is no mass or pulsatile mass.     Tenderness: There is abdominal tenderness in the epigastric area. There is no guarding or rebound.     Comments: Minimal epigastric ttp.  Musculoskeletal:     Right lower leg: Edema present.     Left lower leg: Edema present.  Skin:    General: Skin is warm and dry.     Coloration: Skin is not jaundiced or pale.     Findings: No bruising, erythema, lesion or rash.  Neurological:     General: No focal deficit present.     Mental Status: She is alert and oriented to person, place, and time.     GCS: GCS eye subscore is 4. GCS verbal subscore is 5. GCS motor subscore is 6.  Psychiatric:        Mood and Affect: Mood normal.        Behavior: Behavior normal. Behavior is cooperative.    ED Results / Procedures / Treatments   Labs (all labs ordered are listed, but only abnormal results are displayed) Labs Reviewed  CBC WITH DIFFERENTIAL/PLATELET - Abnormal; Notable for the following components:      Result Value   WBC 14.9 (*)    Neutro Abs 13.2 (*)    Lymphs  Abs 0.4 (*)    Abs Immature Granulocytes 0.08 (*)    All other components within normal limits  COMPREHENSIVE METABOLIC PANEL - Abnormal; Notable for the following components:   CO2 18 (*)    Glucose, Bld 137 (*)    BUN 25 (*)    All other components within normal limits   URINALYSIS, ROUTINE W REFLEX MICROSCOPIC - Abnormal; Notable for the following components:   Hgb urine dipstick TRACE (*)    Protein, ur 30 (*)    Leukocytes,Ua SMALL (*)    All other components within normal limits  URINALYSIS, MICROSCOPIC (REFLEX) - Abnormal; Notable for the following components:   Bacteria, UA RARE (*)    All other components within normal limits  RESP PANEL BY RT-PCR (FLU A&B, COVID) ARPGX2  LIPASE, BLOOD    EKG EKG Interpretation  Date/Time:  Sunday February 27 2021 14:09:35 EST Ventricular Rate:  92 PR Interval:  180 QRS Duration: 160 QT Interval:  427 QTC Calculation: 529 R Axis:   -41 Text Interpretation: Sinus rhythm Left bundle branch block Confirmed by Bethann Berkshire [phone]) on 02/27/2021 2:12:15 PM  Radiology DG Chest 2 View  Result Date: 02/27/2021 CLINICAL DATA:  Headache with body aches EXAM: CHEST - 2 VIEW COMPARISON:  None. FINDINGS: Normal mediastinum and cardiac silhouette. Normal pulmonary vasculature. No evidence of effusion, infiltrate, or pneumothorax. No acute bony abnormality. IMPRESSION: No acute cardiopulmonary process. Electronically Signed   By: Genevive Bi M.D.   On: 02/27/2021 14:12    Procedures Procedures   Medications Ordered in ED Medications  acetaminophen (TYLENOL) tablet 650 mg (650 mg Oral Given 02/27/21 1347)  sodium chloride 0.9 % bolus 1,000 mL (0 mLs Intravenous Stopped 02/27/21 1450)    ED Course  I have reviewed the triage vital signs and the nursing notes.  Pertinent labs & imaging results that were available during my care of the patient were reviewed by me and considered in my medical decision making (see chart for details).  Clinical Course as of 02/27/21 1619  Sun Feb 27, 2021  1350 WBC(!): 14.9 [GL]  1423 BUN(!): 25 [GL]    Clinical Course User Index [GL] Herrick Hartog, Finis Bud, PA-C   MDM Rules/Calculators/A&P                          This is a 80 y.o. female with a PMH of COPD, hypertension,  anemia, GERD, stage III CKD, chronic leg edema who presents to the ED with complaints of headache, body aches, sore throat, worsening cough, nausea, and epigastric abdominal tenderness that started this morning when she woke up.  She took Tylenol around 12 PM and is helped her symptoms a little bit.  Review of Past Records: Recently admitted for complicated UTI and October.  Patient with history of chronic bronchitis.  Vitals: Tachycardia at 114.  Temp 99.2.  Oxygenating well on room air.  HD stable  Exam: Lungs are clear to auscultation with no wheezing.  No tachypnea.  Abdomen with minimal tenderness to palpation of epigastric region.  Patient does have evidence of dry mucous membranes.  Initial Impression: Patient is likely dehydrated as evidenced by dry mucous membranes and tachycardia.  She likely has developed a viral upper respiratory infection causing a lot of her symptoms.  We will work-up for COVID and flu.  Will get abdominal labs and urine to assess for dehydration and recurrence of UTI.  Chest x-ray obtained to evaluate for pneumonia.  I personally reviewed all laboratory work and imaging. Abnormal results outlined below. Leukocytosis to 14.9, BUN elevated to 25. Creat normal. COVID/Flu negative. CXR normal.   Events/Interventions: IVF, Tylenol  Impression: The patient likely has an upper respiratory infection with some element of dehydration.  Apparently, at some point in the ED she desaturated to 87% and required 2 L of oxygen.  She had previously been saturating fine.  She does not wear oxygen at home.  I weaned her from the oxygen.  She has had no return of hypoxia at rest. She ambulated well with the RN with no hypoxia or shortness of breath. Tachycardia has improved to normal following fluids.   Dispo Plan: I Feel patient is safe to be discharged home.  She likely has an upper respiratory infection that is complicated by dehydration.  I have seen and evaluated this patient in  conjunction with my attending physician who agrees and has made changes to the plan accordingly.  Portions of this note were generated with [employer]. Dictation errors may occur despite best attempts at proofreading.    Final Clinical Impression(s) / ED Diagnoses Final diagnoses:  Generalized weakness    Rx / DC Orders ED Discharge Orders     None        Adolphus Birchwood, PA-C 02/27/21 1619    Milton Ferguson, MD 02/28/21 [phone]

## 2021-02-27 NOTE — Discharge Instructions (Addendum)
You were seen in the Emergency Department today for fatigue, body aches, and a headaches as well as some new upper respiratory symptoms. Your workup did not reveal any concerning findings. You likely are developing an upper respiratory infection and have been dehydrated. I encourage drinking plenty of fluids at home. You can continue to use the over the counter cough and cold medication. Use tylenol for your body aches, headaches, and fevers.

## 2021-02-27 NOTE — ED Notes (Signed)
This nurse ambulated pt around hallway. Pt stable on ambulation and O2 sats 92% on RA. Once pt sat back in bed O2 96%.

## 2021-03-24 ENCOUNTER — Ambulatory Visit (INDEPENDENT_AMBULATORY_CARE_PROVIDER_SITE_OTHER): Payer: Medicare HMO

## 2021-03-24 ENCOUNTER — Other Ambulatory Visit: Payer: Self-pay

## 2021-03-24 DIAGNOSIS — G4730 Sleep apnea, unspecified: Secondary | ICD-10-CM

## 2021-04-13 ENCOUNTER — Ambulatory Visit: Payer: Medicare HMO

## 2021-04-13 ENCOUNTER — Other Ambulatory Visit: Payer: Self-pay

## 2021-04-13 DIAGNOSIS — G4733 Obstructive sleep apnea (adult) (pediatric): Secondary | ICD-10-CM

## 2021-04-18 ENCOUNTER — Telehealth: Payer: Self-pay | Admitting: Pulmonary Disease

## 2021-04-18 DIAGNOSIS — G4733 Obstructive sleep apnea (adult) (pediatric): Secondary | ICD-10-CM

## 2021-04-18 NOTE — Telephone Encounter (Signed)
Called and spoke to patient. She voiced understanding of results. Has an appt with Dr. Craige Cotta on 05/03/2021 and will discuss results at that appt. Offered sooner appt in [place] office but patient declined. Nothing further needed.

## 2021-04-18 NOTE — Telephone Encounter (Signed)
HST 04/13/21 >> AHI 5.7, SpO2 low 79%   Please inform her that her sleep study shows mild obstructive sleep apnea.  Please arrange for ROV with me or NP to discuss treatment options.

## 2021-05-03 ENCOUNTER — Encounter: Payer: Self-pay | Admitting: Pulmonary Disease

## 2021-05-03 ENCOUNTER — Other Ambulatory Visit: Payer: Self-pay

## 2021-05-03 ENCOUNTER — Ambulatory Visit: Payer: Medicare HMO | Admitting: Pulmonary Disease

## 2021-05-03 VITALS — BP 132/70 | HR 72 | Temp 98.4°F | Ht 62.5 in | Wt 168.4 lb

## 2021-05-03 DIAGNOSIS — Z7189 Other specified counseling: Secondary | ICD-10-CM

## 2021-05-03 DIAGNOSIS — G4733 Obstructive sleep apnea (adult) (pediatric): Secondary | ICD-10-CM

## 2021-05-03 DIAGNOSIS — J449 Chronic obstructive pulmonary disease, unspecified: Secondary | ICD-10-CM

## 2021-05-03 DIAGNOSIS — F5101 Primary insomnia: Secondary | ICD-10-CM

## 2021-05-03 MED ORDER — ZOLPIDEM TARTRATE 10 MG PO TABS: 10.0000 mg | ORAL_TABLET | Freq: Every day | ORAL | 5 refills | Status: DC

## 2021-05-03 NOTE — Progress Notes (Signed)
[facility]  Chief Complaint  Patient presents with   Follow-up    SOB about the same since last visit. Cough. Appt to discuss OSA treatment options.     Past Surgical History:  She  has a past surgical history that includes Abdominal hysterectomy; Foot surgery (Left); Bunionectomy (Bilateral); Back surgery; Shoulder hemi-arthroplasty (Left, 03/25/2014); Shoulder arthroscopy (Right); Knee surgery; Joint replacement (Bilateral); and Joint replacement (Right, 2010 or 2012).  Past Medical History:  Anemia, Anxiety, OA, COPD, Depression, GERD, Glaucoma, Gout, HTN, Tremor, Back pain, Rt cerebellar CVA  Constitutional:  BP 132/70 (BP Location: Left Arm, Patient Position: Sitting)    Pulse 72    Temp 98.4 F (36.9 C) (Temporal)    Ht 5' 2.5" (1.588 m)    Wt 168 lb 6.4 oz (76.4 kg)    SpO2 99% Comment: ra   BMI 30.31 kg/m   Brief Summary:  Heather [name] is a 81 y.o. female former smoker with insomnia, obstructive sleep apnea and COPD.      Subjective:   She had overnight oximetry in October that showed intermittent hypoxia.  She then had home sleep study this month.  Showed mild sleep apnea.  Breathing has been stable.  She can get around a grocery store without having to rest.  Has occasional cough with clear sputum.  Doesn't feel like she can sleep without using ambien.  No hangover effect.    Physical Exam:   Appearance - well kempt   ENMT - no sinus tenderness, no oral exudate, no LAN, Mallampati 4 airway, no stridor, wears dentures  Respiratory - equal breath sounds bilaterally, no wheezing or rales  CV - s1s2 regular rate and rhythm, no murmurs  Ext - no clubbing, no edema  Skin - no rashes  Psych - normal mood and affect    Pulmonary testing:  PFT 05/25/20 >> FEV1 1.40 (97%), FEV1% 68, TLC 6.52 ( 137%), RV 4.28 (190%), DLCO 58%  Sleep Tests:  ONO with RA 12/30/20 >> test time 7 hrs 29 min.  Baseline SpO2 92%, low SpO2  80%.  Spent 4 min with SpO2 < 88%. HST 04/13/21 >> AHI 5.7, SpO2 low 79%  Cardiac Tests:  Echo 12/14/20 >> EF 50 to 55%, grade 1 DD Bilateral U/S lower legs 02/21/21 >> no DVT  Social History:  She  reports that she quit smoking about 36 years ago. Her smoking use included cigarettes. She has a 40.00 pack-year smoking history. She has never used smokeless tobacco. She reports that she does not drink alcohol and does not use drugs.  Family History:  Her family history includes Aneurysm in her father; Congestive Heart Failure in her niece; Diabetes in her brother; High blood pressure in her mother; Hypertension in her niece; Pancreatitis in her brother.     Assessment/Plan:   Obstructive sleep apnea. - reviewed her sleep study - discussed how sleep apnea can impact her health and daytime function - reviewed treatment options - will arrange for Resmed auto CPAP 5 to 15 cm H2O  Insomnia. - she has been on [place] therapy for years and has been tolerating this - continue ambien 10 mg nightly  COPD. - continue breztri two puffs bid - prn albuterol  Time Spent Involved in Patient Care on Day of Examination:  37 minutes  Follow up:   Patient Instructions  Will arrange for auto CPAP set up  Follow up in 4 to 5 months  Medication List:  Allergies as of 05/03/2021       Reactions   Lovastatin Swelling   Patient states that her tongue swells and she gets a tingling feeling all over. Patient states that her tongue swells and she gets a tingling feeling all over.   Lisinopril         Medication List        Accurate as of May 03, 2021  9:56 AM. If you have any questions, ask your nurse or doctor.          allopurinol 300 MG tablet Commonly known as: ZYLOPRIM Take 0.5 tablets (150 mg total) by mouth daily.   amlodipine-olmesartan 10-20 MG tablet Commonly known as: AZOR Take 1 tablet by mouth daily.   aspirin EC 81 MG tablet Take 1 tablet (81 mg total) by  mouth daily. Swallow whole.   atorvastatin 10 MG tablet Commonly known as: LIPITOR Take 1 tablet (10 mg total) by mouth daily.   atorvastatin 10 MG tablet Commonly known as: LIPITOR Take 1 tablet by mouth every morning.   Breztri Aerosphere 160-9-4.8 MCG/ACT Aero Generic drug: Budeson-Glycopyrrol-Formoterol Inhale 2 puffs into the lungs 2 (two) times daily.   DIALYVITE VITAMIN D 5000 PO Take by mouth.   Cholecalciferol 1.25 MG (50000 UT) Tabs Take by mouth.   DULoxetine 60 MG capsule Commonly known as: CYMBALTA Take 1 capsule (60 mg total) by mouth daily.   famotidine 20 MG tablet Commonly known as: Pepcid One after supper   famotidine 20 MG tablet Commonly known as: PEPCID Take by mouth.   hydrochlorothiazide 12.5 MG capsule Commonly known as: MICROZIDE Take 12.5 mg by mouth daily.   HYDROcodone-acetaminophen 5-325 MG tablet Commonly known as: NORCO/VICODIN hydrocodone 5 mg-acetaminophen 325 mg tablet  Take 1 tablet twice a day by oral route as needed.   ketoconazole 2 % cream Commonly known as: NIZORAL Apply topically.   ketoconazole 2 % cream Commonly known as: NIZORAL Apply 1 application topically daily.   ProAir HFA 108 (90 Base) MCG/ACT inhaler Generic drug: albuterol Inhale 2 puffs into the lungs every 6 (six) hours as needed for wheezing or shortness of breath.   venlafaxine XR 37.5 MG 24 hr capsule Commonly known as: EFFEXOR-XR Take 37.5 mg by mouth daily.   zolpidem 10 MG tablet Commonly known as: AMBIEN Take 1 tablet (10 mg total) by mouth at bedtime. What changed:  when to take this reasons to take this Changed by: Coralyn Helling, MD        Signature:  Coralyn Helling, MD [facility] Pulmonary/Critical Care Pager - [phone] 05/03/2021, 9:56 AM

## 2021-05-03 NOTE — Patient Instructions (Signed)
Will arrange for auto CPAP set up ? ?Follow up in 4 to 5 months ?

## 2021-05-11 ENCOUNTER — Other Ambulatory Visit: Payer: Self-pay

## 2021-05-11 ENCOUNTER — Encounter: Payer: Self-pay | Admitting: Podiatry

## 2021-05-11 ENCOUNTER — Ambulatory Visit: Payer: Medicare HMO | Admitting: Podiatry

## 2021-05-11 ENCOUNTER — Ambulatory Visit (INDEPENDENT_AMBULATORY_CARE_PROVIDER_SITE_OTHER): Payer: Medicare HMO

## 2021-05-11 DIAGNOSIS — L97511 Non-pressure chronic ulcer of other part of right foot limited to breakdown of skin: Secondary | ICD-10-CM

## 2021-05-11 MED ORDER — CEPHALEXIN 500 MG PO CAPS: 500.0000 mg | ORAL_CAPSULE | Freq: Four times a day (QID) | ORAL | 0 refills | Status: AC

## 2021-05-11 NOTE — Progress Notes (Signed)
?  Subjective:  ?Patient ID: Heather [name], female    DOB: March 08, 1940,   MRN: [id number] ? ?Chief Complaint  ?Patient presents with  ? Callouses  ?  right foot corn on second toe causing pain  ? ? ?81 y.o. female presents for concern of corn on right second toe that has been causing constant throbbing pain. Relates nothing has really helped and here to have it evaluated.  Denies any other pedal complaints. Denies n/v/f/c.  ? ?Past Medical History:  ?Diagnosis Date  ? Anemia   ? Anxiety   ? Arthritis   ? COPD (chronic obstructive pulmonary disease) (HCC)   ? CVA (cerebral vascular accident) [facility])   ? Depression   ? Dry eyes 04/14/2014  ? GERD (gastroesophageal reflux disease)   ? Glaucoma   ? Gout   ? HTN (hypertension)   ? OSA (obstructive sleep apnea)   ? ? ?Objective:  ?Physical Exam: ?Vascular: DP/PT pulses 2/4 bilateral. CFT <3 seconds. Normal hair growth on digits. No edema.  ?Skin. No lacerations or abrasions bilateral feet. Hyperkeratotic lesion noted to lateral second digit on right. Upon debridement 0.1 cm x 0.1 cm x 0.1 cm ulcer noted with granular base. No erythema surrounding and no edema. Some discoloration noted around the digit proximally.  ?Musculoskeletal: MMT 5/5 bilateral lower extremities in DF, PF, Inversion and Eversion. Deceased ROM in DF of ankle joint.  ?Neurological: Sensation intact to light touch.  ? ?Assessment:  ? ?1. Ulcer of right second toe, limited to breakdown of skin (HCC)   ? ? ? ?Plan:  ?Patient was evaluated and treated and all questions answered. ?Ulcer right second digit limited to breakdown of skin  ?-X-rays reviewed possible erosion noted to head of proximal phalanx will keep an eye on this area.  ?-Debridement as below. ?-Dressed with betadine, DSD. ?-Off-loading with surgical shoe. ?-Keflex sent to pharmacy.  ?-Discussed glucose control and proper protein-rich diet.  ?-Discussed if any worsening redness, pain, fever or chills to call or may need to report to the emergency  room. Patient expressed understanding.  ?Xrs on follow-up ? ?Procedure: Excisional Debridement of Wound ?Rationale: Removal of non-viable soft tissue from the wound to promote healing.  ?Anesthesia: none ?Pre-Debridement Wound Measurements: Overlying callus  ?Post-Debridement Wound Measurements: 0.1 cm x 0.1 cm x 0.1 cm  ?Type of Debridement: Sharp Excisional ?Tissue Removed: Non-viable soft tissue ?Depth of Debridement: subcutaneous tissue. ?Technique: Sharp excisional debridement to bleeding, viable wound base.  ?Dressing: Dry, sterile, compression dressing. ?Disposition: Patient tolerated procedure well. Patient to return in 2 week for follow-up. ? ?Return in about 2 weeks (around 05/25/2021) for wound check. ? ? ?Louann Sjogren, DPM  ? ? ?

## 2021-05-25 ENCOUNTER — Encounter: Payer: Self-pay | Admitting: Podiatry

## 2021-05-25 ENCOUNTER — Other Ambulatory Visit: Payer: Self-pay

## 2021-05-25 ENCOUNTER — Ambulatory Visit: Payer: Medicare HMO | Admitting: Podiatry

## 2021-05-25 DIAGNOSIS — L97511 Non-pressure chronic ulcer of other part of right foot limited to breakdown of skin: Secondary | ICD-10-CM

## 2021-05-25 NOTE — Progress Notes (Signed)
?  Subjective:  ?Patient ID: Heather [name], female    DOB: 1940-11-02,   MRN: [id number] ? ?No chief complaint on file. ? ? ?81 y.o. female presents for follow-up of ulcer on right second toe that has been causing constant throbbing pain. Relates it is doing better.  Denies any other pedal complaints. Denies n/v/f/c.  ? ?Past Medical History:  ?Diagnosis Date  ? Anemia   ? Anxiety   ? Arthritis   ? COPD (chronic obstructive pulmonary disease) ([place])   ? CVA (cerebral vascular accident) [facility])   ? Depression   ? Dry eyes 04/14/2014  ? GERD (gastroesophageal reflux disease)   ? Glaucoma   ? Gout   ? HTN (hypertension)   ? OSA (obstructive sleep apnea)   ? ? ?Objective:  ?Physical Exam: ?Vascular: DP/PT pulses 2/4 bilateral. CFT <3 seconds. Normal hair growth on digits. No edema.  ?Skin. No lacerations or abrasions bilateral feet. Hyperkeratotic lesion noted to lateral second digit on right. Ulcer healed underneath.  ?Musculoskeletal: MMT 5/5 bilateral lower extremities in DF, PF, Inversion and Eversion. Deceased ROM in DF of ankle joint.  ?Neurological: Sensation intact to light touch.  ? ?Assessment:  ? ?1. Ulcer of right second toe, limited to breakdown of skin ([place])   ? ? ? ? ?Plan:  ?Patient was evaluated and treated and all questions answered. ?Ulcer right second digit -healed  ?-X-rays reviewed possible erosion noted to head of proximal phalanx will keep an eye on this area.  ?-No debridement necessary  ?-Toe cap provided to protect area.  ?-May return to regular shoes.  ?No abx necessary.  ?-Discussed glucose control and proper protein-rich diet.  ?-Discussed if any worsening redness, pain, fever or chills to call or may need to report to the emergency room. Patient expressed understanding.  ?-Patient to return in 4 weeks for re-check to make sure ulcer stil healed.  ? ? ? ?Return in about 4 weeks (around 06/22/2021) for wound check. ? ? ?Lorenda Peck, DPM  ? ? ?

## 2021-06-16 ENCOUNTER — Ambulatory Visit: Payer: Medicare HMO | Admitting: Podiatry

## 2021-06-16 DIAGNOSIS — M79674 Pain in right toe(s): Secondary | ICD-10-CM

## 2021-06-16 DIAGNOSIS — M2041 Other hammer toe(s) (acquired), right foot: Secondary | ICD-10-CM | POA: Diagnosis not present

## 2021-06-16 DIAGNOSIS — M2031 Hallux varus (acquired), right foot: Secondary | ICD-10-CM | POA: Diagnosis not present

## 2021-06-16 DIAGNOSIS — Z8261 Family history of arthritis: Secondary | ICD-10-CM | POA: Diagnosis not present

## 2021-06-16 DIAGNOSIS — M7989 Other specified soft tissue disorders: Secondary | ICD-10-CM

## 2021-06-16 MED ORDER — METHYLPREDNISOLONE 4 MG PO TBPK: ORAL_TABLET | ORAL | 0 refills | Status: DC

## 2021-06-16 NOTE — Patient Instructions (Signed)
Call [facility] at [phone] to schedule your MRI ° °

## 2021-06-16 NOTE — Progress Notes (Signed)
?  Subjective:  ?Patient ID: Heather [name], female    DOB: 03/22/1940,  MRN: [id number] ? ?Chief Complaint  ?Patient presents with  ? Foot Ulcer  ?  Right second toe wound check - in alot of pain  ? ? ?81 y.o. female presents with the above complaint. History confirmed with patient.  She presents today for follow-up for chronic ongoing right foot pain.  She has a history of prior bunion surgery and toe surgery.  Says her toes and feet have always been deformed since the surgery.  Recently the second toe developed a wound and Dr. Blenda Mounts successfully treated this. ? ?Objective:  ?Physical Exam: ?warm, good capillary refill, no trophic changes or ulcerative lesions, normal DP and PT pulses, and normal sensory exam. ? 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 Ulceration is fully healed there is no open skin draining lesions or signs of infection ? ?Radiographs: ?Multiple views x-ray of the right foot: I reviewed her most recent right foot radiographs and there is severe hallux valgus deformity, arthrosis of the PIPJ's of multiple digits, diffuse osteopenia ?Assessment:  ? ?1. Pain and swelling of toe of right foot   ?2. Family history of rheumatoid arthritis   ?3. Hallux varus, right   ?4. Hammertoe of right foot   ? ? ? ?Plan:  ?Patient was evaluated and treated and all questions answered. ? ?I reviewed her most recent right foot radiographs with her.  Her wound is fully healed at this point.  I do think it be important to evaluate for the possibility of infection causing this pain.  I have ordered lab work including ESR and CBC.  Additionally have ordered HLA-B27 and arthritis panel, some of this may be rheumatoid arthritis and she is a strong family  history of this with her sister.  We also discussed surgical correction of her severe deformities and the risks and benefits of this.  Surgical we discussed fusion of the first MTPJ and metatarsal head resection with toe deformity correction.  I would like to order an MRI to evaluate the soft tissue structures surrounding the MTPJ's and cartilage of her joints, this is for surgical planning.  I will see her back after her lab work and MRI. ? ?Return for after MRI to review.  ? ?

## 2021-06-21 ENCOUNTER — Ambulatory Visit
Admission: RE | Admit: 2021-06-21 | Discharge: 2021-06-21 | Disposition: A | Discharge status: Home / Self Care | Payer: Medicare HMO | Source: Ambulatory Visit | Attending: Podiatry | Admitting: Podiatry

## 2021-06-21 DIAGNOSIS — M7989 Other specified soft tissue disorders: Secondary | ICD-10-CM

## 2021-06-21 DIAGNOSIS — M2041 Other hammer toe(s) (acquired), right foot: Secondary | ICD-10-CM

## 2021-06-21 DIAGNOSIS — M2031 Hallux varus (acquired), right foot: Secondary | ICD-10-CM

## 2021-06-21 DIAGNOSIS — Z8261 Family history of arthritis: Secondary | ICD-10-CM

## 2021-06-22 ENCOUNTER — Ambulatory Visit: Payer: Medicare HMO | Admitting: Podiatry

## 2021-06-23 ENCOUNTER — Telehealth: Payer: Self-pay | Admitting: Podiatry

## 2021-06-23 LAB — ARTHRITIS PANEL
Anti Nuclear Antibody (ANA): NEGATIVE
Rheumatoid fact SerPl-aCnc: <10 IU/mL (ref <14.0)
Sed Rate: 28 mm/hr (ref 0–40)
Uric Acid: 4.5 mg/dL (ref 3.1–7.9)

## 2021-06-23 LAB — CBC WITH DIFFERENTIAL/PLATELET
Basophils Absolute: 0.1 10*3/uL (ref 0.0–0.2)
Basos: 1 %
EOS (ABSOLUTE): 0.2 10*3/uL (ref 0.0–0.4)
Eos: 4 %
Hematocrit: 36.4 % (ref 34.0–46.6)
Hemoglobin: 12.4 g/dL (ref 11.1–15.9)
Immature Grans (Abs): 0 10*3/uL (ref 0.0–0.1)
Immature Granulocytes: 0 %
Lymphocytes Absolute: 1.2 10*3/uL (ref 0.7–3.1)
Lymphs: 25 %
MCH: 31.8 pg (ref 26.6–33.0)
MCHC: 34.1 g/dL (ref 31.5–35.7)
MCV: 93 fL (ref 79–97)
Monocytes Absolute: 0.5 10*3/uL (ref 0.1–0.9)
Monocytes: 11 %
Neutrophils Absolute: 3 10*3/uL (ref 1.4–7.0)
Neutrophils: 59 %
Platelets: 215 10*3/uL (ref 150–450)
RBC: 3.9 x10E6/uL (ref 3.77–5.28)
RDW: 13.1 % (ref 11.7–15.4)
WBC: 5 10*3/uL (ref 3.4–10.8)

## 2021-06-23 LAB — HLA-B27 ANTIGEN: HLA B27: NEGATIVE

## 2021-06-23 NOTE — Telephone Encounter (Signed)
Pt called about her mri results, she had it done 2 days ago and stated she was told to call 2 days after having it. I have scheduled pt for 5.4.2023 to discuss results. Pt is aware Dr Lilian Kapur is out of the office this week so he will not get the message until next week and will let us know what how he would like to proceed. ?

## 2021-06-28 ENCOUNTER — Telehealth: Payer: Self-pay | Admitting: Pulmonary Disease

## 2021-06-28 NOTE — Telephone Encounter (Signed)
Called and spoke to patient. She states the [place] adapt office called her and she told them she couldn't come to [place]. They offered to send her info to [facility] and patient never heard back. Confirmed with [facility] Cordelia Pen that [place] adapt location is Programme researcher, broadcasting/film/video. Number is [phone]. Gave this info to patient. She is going to call and states if she needs anything further from [place] that she will call the office back. Nothing further needed at this time  ?

## 2021-07-07 ENCOUNTER — Ambulatory Visit: Payer: Medicare HMO | Admitting: Podiatry

## 2021-07-07 DIAGNOSIS — M21961 Unspecified acquired deformity of right lower leg: Secondary | ICD-10-CM

## 2021-07-07 DIAGNOSIS — M2041 Other hammer toe(s) (acquired), right foot: Secondary | ICD-10-CM | POA: Diagnosis not present

## 2021-07-07 DIAGNOSIS — M2031 Hallux varus (acquired), right foot: Secondary | ICD-10-CM | POA: Diagnosis not present

## 2021-07-07 DIAGNOSIS — E559 Vitamin D deficiency, unspecified: Secondary | ICD-10-CM

## 2021-07-07 NOTE — Patient Instructions (Signed)
Look for an "EvenUp" shoe attachment on [employer] or at [employer]. This will level out your hips while you are walking in the CAM boot. Wear this on the other foot around a supportive sneaker:     

## 2021-07-08 LAB — VITAMIN D 25 HYDROXY (VIT D DEFICIENCY, FRACTURES): Vit D, 25-Hydroxy: 65.1 ng/mL (ref 30.0–100.0)

## 2021-07-11 ENCOUNTER — Encounter: Payer: Self-pay | Admitting: Podiatry

## 2021-07-11 NOTE — Progress Notes (Signed)
?Subjective:  ?Patient ID: Heather [name], female    DOB: [birth year],  MRN: [id number] ? ?Chief Complaint  ?Patient presents with  ? Bunions  ?  Right foot follow up after MRI  ? ? ?81 y.o. female presents with the above complaint. History confirmed with patient.  She presents today for follow-up for chronic ongoing right foot pain.  She has a history of prior bunion surgery and toe surgery.  Says her toes and feet have always been deformed since the surgery.  Recently the second toe developed a wound and Dr. Ralene Cork successfully treated this. ? ?Interval history: ?She completed her lab work and the MRI ? ?Objective:  ?Physical Exam: ?warm, good capillary refill, no trophic changes or ulcerative lesions, normal DP and PT pulses, and normal sensory exam. ? 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 There is no ulceration ? ?Radiographs: ?Multiple views x-ray of the right foot: I reviewed her most recent right foot radiographs and there is severe hallux valgus deformity, arthrosis of the PIPJ's of multiple digits, diffuse osteopenia ? ?Study Result ? ?Narrative & Impression  ?CLINICAL DATA:  Foot pain and swelling along the top of the big toe ?and foot. ?  ?EXAM: ?MRI OF THE RIGHT FOREFOOT WITHOUT CONTRAST ?  ?TECHNIQUE: ?Multiplanar, multisequence MR imaging of the right foot was ?performed. No intravenous contrast was administered. ?  ?COMPARISON:  None. ?  ?FINDINGS: ?Bones/Joint/Cartilage ?  ?No fracture or dislocation.  No joint effusion. ?  ?Hallux valgus with medial subluxation of the first proximal phalanx. ?Moderate osteoarthritis of the first MTP joint. Mild osteoarthritis ?of the first IP joint. ?  ?Mild osteoarthritis of the talonavicular  joint. Mild osteoarthritis ?of the second tarsometatarsal joint. ?  ?Arthritic changes of the medial and lateral hallux ?sesamoid-metatarsal articulation with subchondral marrow edema. ?  ?Ligaments ?  ?Collateral ligaments are intact.  Lisfranc ligament is intact. ?  ?Muscles and Tendons ?  ?Flexor, peroneal and extensor compartment tendons are intact. ?Muscles are normal. ?  ?Soft tissue ?No fluid collection or hematoma. No soft tissue mass. Mild soft ?tissue edema involving the dorsal foot. ?  ?IMPRESSION: ?1. Hallux valgus with medial subluxation of the first proximal ?phalanx. Moderate osteoarthritis of the first MTP joint. ?2. Mild osteoarthritis of the first IP joint. ?3. Mild osteoarthritis of the talonavicular joint. ?4. Mild osteoarthritis of the second tarsometatarsal joint. ?5. Arthritic changes of the medial and lateral hallux ?sesamoid-metatarsal articulation with subchondral marrow edema. ?  ?  ?Electronically Signed  ? ?Lab work for inflammatory arthropathy was negative ?Assessment:  ? ?1. Hallux varus, right   ?2. Hammertoe of right foot   ?3. Deformity of metatarsal bone of right foot   ?4. Vitamin D insufficiency   ? ? ? ?Plan:  ?Patient was evaluated and treated and all questions answered. ? ?We again reviewed her most recent radiographs and her recent right foot MRI.  Also reviewed the results of her lab work with her.  I sent her for vitamin D level as well which was normal.  We discussed surgical correction of the issue and she is ready to proceed with this.  We discussed the risk benefits and potential complications of surgery including but not limited to  pain, swelling, infection,  scar, numbness which may be temporary or permanent, chronic pain, stiffness, nerve pain or damage, wound healing problems, bone healing problems including delayed or non-union.  She understands and wishes to proceed.  Surgical we discussed metatarsophalangeal joint fusion, hammertoe correction of the lateral 4  toes metatarsal head resection of the lateral metatarsals and bone graft from the heel.  Surgical consent was signed today.  All questions were addressed.  No guarantees as the outcome of surgery were able to be made.  We also discussed due to the severe contracture she has the possibility of a wound dehiscence or vascular compromise of the digits and subsequent amputation is a possibility but is not likely.  She understands and wishes to proceed. ? ? ?Surgical plan: ? ?Procedure: ?-First MPJ fusion, pan metatarsal head resection, hammertoe correction 2345, bone graft from heel ? ?Location: ?-[facility] ? ?Anesthesia plan: ?-IV sedation with regional block ? ?Postoperative pain plan: ?- Tylenol 1000 mg every 6 hours, ibuprofen 600 mg every 6 hours, gabapentin 300 mg every 8 hours x5 days, oxycodone 5 mg 1-2 tabs every 6 hours only as needed ? ?DVT prophylaxis: ?-None required ? ?WB Restrictions / DME needs: ?-Will be WBAT in CAM boot this was dispensed today ? ? ? ?No follow-ups on file.  ? ?

## 2021-07-13 ENCOUNTER — Telehealth: Payer: Self-pay | Admitting: Urology

## 2021-07-13 NOTE — Telephone Encounter (Signed)
DOS - 08/05/21 ? ?METATARSAL HEAD RESECTION 5TH RIGHT --- [phone] ?METATARSAL HEAD RESECTION 2-4 RIGHT --- 28112 ?HAMMERTOE REPAIR 2-5 RIGHT --- 69450 ?HALLUX MPJ FUSION RIGHT --- [phone] ? ?[facility] EFFECTIVE DATE - 06/05/19 ? ? ?PER [employer] FOR CPT CODES 80034 AND [phone] NO PRIOR AUTH IS REQUIRED. FOR CPT CODES 50569 AND 28750 HAVE BEEN APPROVED, AUTH # [id number], GOOD FROM 08/05/21 - 08/05/21. ? Marny Lowenstein # [id number] ?

## 2021-07-14 DIAGNOSIS — D120 Benign neoplasm of cecum: Secondary | ICD-10-CM | POA: Insufficient documentation

## 2021-08-02 ENCOUNTER — Encounter: Payer: Self-pay | Admitting: Internal Medicine

## 2021-08-02 ENCOUNTER — Ambulatory Visit: Payer: Medicare HMO | Admitting: Internal Medicine

## 2021-08-02 ENCOUNTER — Ambulatory Visit (HOSPITAL_COMMUNITY)
Admission: RE | Admit: 2021-08-02 | Discharge: 2021-08-02 | Disposition: A | Discharge status: Home / Self Care | Payer: Medicare HMO | Source: Ambulatory Visit | Attending: Internal Medicine | Admitting: Internal Medicine

## 2021-08-02 DIAGNOSIS — J449 Chronic obstructive pulmonary disease, unspecified: Secondary | ICD-10-CM | POA: Diagnosis not present

## 2021-08-02 DIAGNOSIS — M7989 Other specified soft tissue disorders: Secondary | ICD-10-CM

## 2021-08-02 DIAGNOSIS — R0609 Other forms of dyspnea: Secondary | ICD-10-CM

## 2021-08-02 NOTE — Patient Instructions (Addendum)
Plan A = Automatic = Always=    Breztri try just one puff twice daily for now to see if your heart rate slows on lower dose   Plan B = Backup (to supplement plan A, not to replace it) Only use your albuterol inhaler as a rescue medication to be used if you can't catch your breath by resting or doing a relaxed purse lip breathing pattern.  - The less you use it, the better it will work when you need it. - Ok to use the inhaler up to 2 puffs  every 4 hours if you must but call for appointment if use goes up over your usual need - Don't leave home without it !!  (think of it like the spare tire for your car)   Please remember to go to the lab and x-ray department at [facility]   for your tests - we will call you with the results when they are available.  Please schedule a follow up office visit in 4 weeks, sooner if needed - bring inhalers

## 2021-08-02 NOTE — Progress Notes (Signed)
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    H/o blood clot in R leg and some leg swelling since always worse on the R    History of Present Illness  05/25/2020  Pulmonary/ 1st office eval/ Jermarcus Mcfadyen / [facility]  Chief Complaint  Patient presents with   Pulmonary Consult    Referred by Dr. Bertram Savin.  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 She c/o cough x 6 months. Cough is occ prod with light green to yellow sputum.  She has DOE walking up stairs. She uses proair 4-5 x per wk.   Dyspnea:  [employer] 1-2 aisles x one year  Cough: x 6 mo/ assoc hoarseness  Sleep: bed is flat/ 2 pillows no problem SABA use: poor techniuque/ hoarse worse with dpi  rec Plan A = Automatic = Always=    Stiolto 2 pffs 1st thing each am  Work on inhaler technique: Remember to do the practice breaths first  Plan B = Backup (to supplement plan A, not to replace it) Only use your albuterol inhaler as a rescue medication zpak should turn mucus white Prilosec 20 mg Take 30- 60 min before your first and last meals of the day  Please schedule a follow up office visit in 4 weeks, sooner if needed with pfts     07/19/2020  f/u ov/[place] office/Dontrel Smethers re:  GOLD I  Dyspnea: easier to shop since started stiolto / wants to know if can change to breztri same benefit  Cough: better  Sleeping: bed is flat / 2 pillows  SABA use: rarely  02: none  Covid status: vax x 3  Rec Plan A = Automatic = Always=   Breztri Take 2 puffs first thing in am and then another 2 puffs about 12 hours later.  Work on inhaler technique:    Plan B = Backup (to supplement plan A, not to replace it) Only use your albuterol inhaler as a rescue medication    11/01/2020  f/u ov/[place] office/Sherree Shankman re: GOLD I copd  maint on breztri    No chief complaint on file. Dyspnea:  [employer] ok / uses [facility] parking due to breathing and arthritis back and hips  Cough: mostly noct / non productive Sleeping: 2 pillows  / no bed blocks yet SABA use: once a week noct, never daytime  02: none Covid status: x 4  Lung cancer screening: n/a  Rec Add pepcid 20 mg after supper and start using bed blocks  If still coughing at night >  add CHLORPHENIRAMINE  4 mg   GERD diet/ lifestyle.  Please schedule a follow up visit in 3 months but call sooner if needed  with all medications /inhalers/ solutions in hand       01/31/2021  f/u ov/[place] office/Wende Longstreth re: GOLD 1 copd  maint on Breztri 2bid   Chief Complaint  Patient presents with   Follow-up    Feels cough and SOB have improved since last Ov. Right leg swelling since Monday of last week.   Dyspnea:  walking at [employer] before sob/ back / legs give out all the same time x slow pace Cough: gone  Sleeping: on bed blocks no resp cc  SABA use: 3 -4 x per  week 02: none  Covid status: vax x 4 including new bivalent  Chronic leg swelling worst ever/ rx by pcp Rec Elevate legs as much as possible and follow up with Dr Kirtland Bouchard regarding your fluid pills  We will schedule venous dopplers >>>  Venous dopplers 02/21/21  Neg     08/02/2021  f/u ov/[place] office/Jonavan Vanhorn re: copd gold 1 maint on breztri   2 bid  Chief Complaint  Patient presents with   Follow-up    Feels SOB has worsened since last ov.  Cough has improved.   Dyspnea:  new problem walking appt to car 50 ft flat x 4 weeks correlated with elevation of legs to remove fluid  Cough: none now  Sleeping: d/c bed blocks raised feet / on cpap x one week per Dr Craige Cotta  SABA use: none  02: none  Covid status: vax x 4     No obvious day to day or daytime variability or assoc excess/ purulent sputum or mucus plugs or hemoptysis or cp or chest tightness, subjective wheeze or overt sinus or hb symptoms.   Sleeping  without nocturnal  or  early am exacerbation  of respiratory  c/o's or need for noct saba. Also denies any obvious fluctuation of symptoms with weather or environmental changes or other aggravating or alleviating factors except as outlined above   No unusual exposure hx or h/o childhood pna/ asthma or knowledge of premature birth.  Current Allergies, Complete Past Medical History, Past Surgical History, Family History, and Social History were reviewed in [name].  ROS  The following are not active complaints unless bolded Hoarseness, sore throat, dysphagia, dental problems, itching, sneezing,  nasal congestion or discharge of excess mucus or purulent secretions, ear ache,   fever, chills, sweats, unintended wt loss or wt gain, classically pleuritic or exertional cp,  orthopnea pnd or arm/hand swelling  or leg swelling improving , presyncope, palpitations, abdominal pain, anorexia, nausea, vomiting, diarrhea  or change in bowel habits or change in bladder habits, change in stools or change in urine, dysuria, hematuria,  rash, arthralgias, visual complaints, headache, numbness, weakness or ataxia or problems with walking or coordination,  change in mood or  memory.        Current Meds  Medication Sig   allopurinol (ZYLOPRIM) 300 MG tablet Take 0.5 tablets (150 mg total) by mouth daily.   amlodipine-olmesartan (AZOR) 10-20 MG tablet Take 1 tablet by mouth daily.   aspirin EC 81 MG tablet Take 1 tablet (81 mg total) by mouth daily. Swallow whole.   atorvastatin (LIPITOR) 10 MG tablet Take 1 tablet (10 mg total) by mouth daily.   atorvastatin (LIPITOR) 10 MG tablet Take 1 tablet by mouth every morning.   Budeson-Glycopyrrol-Formoterol (BREZTRI AEROSPHERE) 160-9-4.8 MCG/ACT AERO Inhale 2 puffs into the lungs 2 (two) times daily.   Cholecalciferol (DIALYVITE VITAMIN D 5000 PO) Take by mouth.   Cholecalciferol 1.25 MG (50000 UT) TABS Take by mouth.   famotidine (PEPCID) 20 MG tablet One after  supper   famotidine (PEPCID) 20 MG tablet Take by mouth.   hydrochlorothiazide (MICROZIDE) 12.5 MG capsule Take 12.5 mg by mouth daily.   HYDROcodone-acetaminophen (NORCO/VICODIN) 5-325 MG tablet hydrocodone 5 mg-acetaminophen 325 mg tablet  Take 1 tablet twice a day by oral route as needed.   ketoconazole (NIZORAL) 2 % cream Apply 1 application topically daily.   ketoconazole (NIZORAL) 2 % cream Apply topically.   PROAIR HFA 108 (90 BASE) MCG/ACT inhaler Inhale  2 puffs into the lungs every 6 (six) hours as needed for wheezing or shortness of breath.    venlafaxine XR (EFFEXOR-XR) 37.5 MG 24 hr capsule Take 37.5 mg by mouth daily.             Past Medical History:  Diagnosis Date   Anemia    Anxiety    Arthritis    COPD (chronic obstructive pulmonary disease) (HCC)    Depression    Dry eyes 04/14/2014   GERD (gastroesophageal reflux disease)    Glaucoma    Gout    Gout 10/23/2018   HTN (hypertension)         Objective:    Wts  08/02/2021        165  01/31/2021      170  11/01/2020        170    07/19/20 179 lb 6.4 oz (81.4 kg)  07/15/20 177 lb (80.3 kg)  05/25/20 174 lb (78.9 kg)  Vital signs reviewed  08/02/2021  - Note at rest 02 sats  96% on RA   General appearance:    amb pleasant bf nad      HEENT : Oropharynx  clear   Nasal turbintes nl    NECK :  without  apparent JVD/ palpable Nodes/TM    LUNGS: no acc muscle use,  Min barrel  contour chest wall with bilateral  slightly decreased bs s audible wheeze and  without cough on insp or exp maneuvers and min  Hyperresonant  to  percussion bilaterally    CV:  RRR  no s3 or murmur or increase in P2, and  pitting edema  L >R despite elastic hose   ABD:  soft and nontender with pos end  insp Hoover's  in the supine position.  No bruits or organomegaly appreciated   MS:  Nl gait/ ext warm without deformities Or obvious joint restrictions  calf tenderness, cyanosis or clubbing     SKIN: warm and dry without lesions     NEURO:  alert, approp, nl sensorium with  no motor or cerebellar deficits apparent.          CXR PA and Lateral:   08/02/2021 :    I personally reviewed images and agree with radiology impression as follows:    No active cardiopulmonary disease.   Labs ordered/ reviewed:    Chemistry      Component Value Date/Time   NA 140 08/02/2021 1308   K 4.4 08/02/2021 1308   CL 102 08/02/2021 1308   CO2 22 08/02/2021 1308   BUN 19 08/02/2021 1308   CREATININE 1.02 (H) 08/02/2021 1308      Component Value Date/Time   CALCIUM 9.4 08/02/2021 1308   ALKPHOS 90 08/02/2021 1308   AST 24 08/02/2021 1308   ALT 23 08/02/2021 1308   BILITOT 0.5 08/02/2021 1308        Lab Results  Component Value Date   WBC 4.7 08/02/2021   HGB 11.5 08/02/2021   HCT 35.2 08/02/2021   MCV 93 08/02/2021   PLT 245 08/02/2021       EOS                                                              0.1  Lab Results  Component Value Date   TSH 1.680 08/02/2021          EKG   08/02/2021    SR with variable aberrancy read as ventricular ectopy and LBBB pattern which 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

## 2021-08-05 ENCOUNTER — Other Ambulatory Visit: Payer: Self-pay | Admitting: Podiatry

## 2021-08-05 DIAGNOSIS — M2011 Hallux valgus (acquired), right foot: Secondary | ICD-10-CM | POA: Diagnosis not present

## 2021-08-05 DIAGNOSIS — M2041 Other hammer toe(s) (acquired), right foot: Secondary | ICD-10-CM

## 2021-08-05 DIAGNOSIS — M21541 Acquired clubfoot, right foot: Secondary | ICD-10-CM

## 2021-08-05 LAB — BASIC METABOLIC PANEL
BUN/Creatinine Ratio: 19 (ref 12–28)
BUN: 19 mg/dL (ref 8–27)
CO2: 22 mmol/L (ref 20–29)
Calcium: 9.4 mg/dL (ref 8.7–10.3)
Chloride: 102 mmol/L (ref 96–106)
Creatinine, Ser: 1.02 mg/dL — ABNORMAL HIGH (ref 0.57–1.00)
Glucose: 125 mg/dL — ABNORMAL HIGH (ref 70–99)
Potassium: 4.4 mmol/L (ref 3.5–5.2)
Sodium: 140 mmol/L (ref 134–144)
eGFR: 56 mL/min/{1.73_m2} — ABNORMAL LOW (ref >59)

## 2021-08-05 LAB — HEPATIC FUNCTION PANEL
ALT: 23 IU/L (ref 0–32)
AST: 24 IU/L (ref 0–40)
Albumin: 3.9 g/dL (ref 3.7–4.7)
Alkaline Phosphatase: 90 IU/L (ref 44–121)
Bilirubin Total: 0.5 mg/dL (ref 0.0–1.2)
Bilirubin, Direct: 0.18 mg/dL (ref 0.00–0.40)
Total Protein: 6.3 g/dL (ref 6.0–8.5)

## 2021-08-05 LAB — CBC WITH DIFFERENTIAL/PLATELET
Basophils Absolute: 0.1 10*3/uL (ref 0.0–0.2)
Basos: 2 %
EOS (ABSOLUTE): 0.1 10*3/uL (ref 0.0–0.4)
Eos: 3 %
Hematocrit: 35.2 % (ref 34.0–46.6)
Hemoglobin: 11.5 g/dL (ref 11.1–15.9)
Immature Grans (Abs): 0 10*3/uL (ref 0.0–0.1)
Immature Granulocytes: 0 %
Lymphocytes Absolute: 0.8 10*3/uL (ref 0.7–3.1)
Lymphs: 16 %
MCH: 30.3 pg (ref 26.6–33.0)
MCHC: 32.7 g/dL (ref 31.5–35.7)
MCV: 93 fL (ref 79–97)
Monocytes Absolute: 0.6 10*3/uL (ref 0.1–0.9)
Monocytes: 13 %
Neutrophils Absolute: 3.1 10*3/uL (ref 1.4–7.0)
Neutrophils: 66 %
Platelets: 245 10*3/uL (ref 150–450)
RBC: 3.8 x10E6/uL (ref 3.77–5.28)
RDW: 13.1 % (ref 11.7–15.4)
WBC: 4.7 10*3/uL (ref 3.4–10.8)

## 2021-08-05 LAB — D-DIMER, QUANTITATIVE

## 2021-08-05 LAB — BRAIN NATRIURETIC PEPTIDE

## 2021-08-05 LAB — TSH: TSH: 1.68 u[IU]/mL (ref 0.450–4.500)

## 2021-08-05 MED ORDER — OXYCODONE HCL 5 MG PO TABS
5.0000 mg | ORAL_TABLET | ORAL | 0 refills | Status: AC | PRN
Start: 2021-08-05 — End: 2021-08-12

## 2021-08-05 MED ORDER — GABAPENTIN 300 MG PO CAPS: 300.0000 mg | ORAL_CAPSULE | Freq: Three times a day (TID) | ORAL | 0 refills | Status: DC

## 2021-08-05 MED ORDER — IBUPROFEN 600 MG PO TABS: 600.0000 mg | ORAL_TABLET | Freq: Three times a day (TID) | ORAL | 0 refills | Status: AC | PRN

## 2021-08-05 MED ORDER — ACETAMINOPHEN 500 MG PO TABS: 1000.0000 mg | ORAL_TABLET | Freq: Four times a day (QID) | ORAL | 0 refills | Status: AC | PRN

## 2021-08-05 NOTE — Progress Notes (Signed)
08/05/21 MPJ fusion, hammertoes and met head resection 2/3/4/5

## 2021-08-07 ENCOUNTER — Encounter: Payer: Self-pay | Admitting: Internal Medicine

## 2021-08-07 NOTE — Assessment & Plan Note (Addendum)
Onset of sob around 2000 p quits smoking 1987  - 05/25/2020  After extensive coaching inhaler device,  effectiveness =    0-75% with smi > continue stiolto 2 each am  - Allergy profile 05/25/20  >  Eos 0.1 /  IgE  4  - PFT's   06/29/20  FEV1 1.40 (97 % ) ratio 0.68  p 2 % improvement from saba p stiolto prior to study with DLCO  10.28 (58%) corrects to 2.60 (62%)  for alv volume and FV curve mild curvature    - 07/19/2020    try breztri 2bid  - 11/01/2020  After extensive coaching inhaler device,  effectiveness =    90% continue breztri, add h2 h1 hs   DDX of  difficult airways management almost all start with A and  include Adherence, Ace Inhibitors, Acid Reflux, Active Sinus Disease, Alpha 1 Antitripsin deficiency, Anxiety masquerading as Airways dz,  ABPA,  Allergy(esp in young), Aspiration (esp in elderly), Adverse effects of meds,  Active smoking or vaping, A bunch of PE's (a small clot burden can't cause this syndrome unless there is already severe underlying pulm or vascular dz with poor reserve) plus two Bs  = Bronchiectasis and Beta blocker use..and one C= CHF  Adherence is always the initial "prime suspect" and is a multilayered concern that requires a "trust but verify" approach in every patient - starting with knowing how to use medications, especially inhalers, correctly, keeping up with refills and understanding the fundamental difference between maintenance and prns vs those medications only taken for a very short course and then stopped and not refilled.  - - The proper method of use, as well as anticipated side effects, of a metered-dose inhaler were discussed and demonstrated to the patient using teach back method.   - advised to return with all meds in hand using a trust but verify approach to confirm accurate Medication  Reconciliation The principal here is that until we are certain that the  patients are doing what we've asked, it makes no sense to ask them to do more.   ? Allergy/asthma  > doubt with EOS 0.2 >>> :  Try lower breztri to one bid to see what if any change this has on tachycardia/ resp symptoms / reviewed also approp use for saba : Re SABA :  I spent extra time with pt today reviewing appropriate use of albuterol for prn use on exertion with the following points: 1) saba is for relief of sob that does not improve by walking a slower pace or resting but rather if the pt does not improve after trying this first. 2) If the pt is convinced, as many are, that saba helps recover from activity faster then it's easy to tell if this is the case by re-challenging : ie stop, take the inhaler, then p 5 minutes try the exact same activity (intensity of workload) that just caused the symptoms and see if they are substantially diminished or not after saba 3) if there is an activity that reproducibly causes the symptoms, try the saba 15 min before the activity on alternate days   If in fact the saba really does help, then fine to continue to use it prn but advised may need to look closer at the maintenance regimen being used to achieve better control of airways disease with exertion.   ? Acid (or non-acid) GERD > always difficult to exclude as up to 75% of pts in some series report no assoc  GI/ Heartburn symptoms> rec continue pepcid for now  ? A bunch of PE's > check d dimer  ? Anxiety/depression/ deconditioning > usually at the bottom of this list of usual suspects but should be much higher on this pt's based on H and P and note already on psychotropics and may interfere with adherence and also interpretation of response or lack thereof to symptom management which can be quite subjective.     ? chf > check BNP, low threshold for cards eval if tachycardia persiss

## 2021-08-07 NOTE — Assessment & Plan Note (Addendum)
Onset of sob around 2000 p quits smoking 1987  - 05/25/2020  After extensive coaching inhaler device,  effectiveness =    0-75% with smi > continue stiolto 2 each am  - Allergy profile 05/25/20  >  Eos 0.1 /  IgE  4  - PFT's   06/29/20  FEV1 1.40 (97 % ) ratio 0.68  p 2 % improvement from saba p stiolto prior to study with DLCO  10.28 (58%) corrects to 2.60 (62%)  for alv volume and FV curve mild curvature    - 07/19/2020   try breztri 2bid  - 01/31/2021  After extensive coaching inhaler device,  effectiveness =    75% continue breztri   W/u not complete as lab canceled  D dimer/ BNP > all other labs ok/ 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

## 2021-08-07 NOTE — Assessment & Plan Note (Signed)
Chronic x years but much worse since 01/24/21 -  Venous dopplers 02/21/21  Neg   Improved with elevation of legs but now breathing worse ? Related >  W/u in progress.          Each maintenance medication was reviewed in detail including emphasizing most importantly the difference between maintenance and prns and under what circumstances the prns are to be triggered using an action plan format where appropriate.  Total time for H and P, chart review, counseling, reviewing hfa device(s) and generating customized AVS unique to this office visit / same day charting > 30 min for pt with new /worsening sob

## 2021-08-11 ENCOUNTER — Ambulatory Visit (INDEPENDENT_AMBULATORY_CARE_PROVIDER_SITE_OTHER): Payer: Medicare HMO

## 2021-08-11 ENCOUNTER — Ambulatory Visit (INDEPENDENT_AMBULATORY_CARE_PROVIDER_SITE_OTHER): Payer: Medicare HMO | Admitting: Podiatry

## 2021-08-11 DIAGNOSIS — M21961 Unspecified acquired deformity of right lower leg: Secondary | ICD-10-CM | POA: Diagnosis not present

## 2021-08-11 DIAGNOSIS — M2031 Hallux varus (acquired), right foot: Secondary | ICD-10-CM

## 2021-08-11 DIAGNOSIS — M2041 Other hammer toe(s) (acquired), right foot: Secondary | ICD-10-CM

## 2021-08-11 MED ORDER — CEPHALEXIN 500 MG PO CAPS: 500.0000 mg | ORAL_CAPSULE | Freq: Three times a day (TID) | ORAL | 0 refills | Status: DC

## 2021-08-15 NOTE — Progress Notes (Signed)
  Subjective:  Patient ID: Heather [name], female    DOB: 01-Aug-1940,  MRN: [id number]  Chief Complaint  Patient presents with   Routine Post Op      POV #1 DOS 08/05/2021 GREAT TOE JOINT FUSION, REMOVAL OF BONES BEHIND TOES, HAMMERTOE CORRECTION 2-5 RT     81 y.o. female returns for post-op check.  Doing okay pain is controlled it feels swollen  Review of Systems: Negative except as noted in the HPI. Denies N/V/F/Ch.   Objective:  There were no vitals filed for this visit. There is no height or weight on file to calculate BMI. Constitutional Well developed. Well nourished.  Vascular Foot warm and well perfused. Capillary refill normal to all digits.  Calf is soft and supple, no posterior calf or knee pain, negative Homans' sign  Neurologic Normal speech. Oriented to person, place, and time. Epicritic sensation to light touch grossly present bilaterally.  Dermatologic Skin healing well without signs of infection. Skin edges well coapted without signs of infection.  Orthopedic: Tenderness to palpation noted about the surgical site.  Moderate edema   Multiple view plain film radiographs: Internal hardware and Kirschner wires intact in good position with good correction of deformity Assessment:   1. Hallux varus, right   2. Hammertoe of right foot   3. Deformity of metatarsal bone of right foot    Plan:  Patient was evaluated and treated and all questions answered.  S/p foot surgery right -Progressing as expected post-operatively. -XR: Noted above -WB Status: WBAT to heel in cam walker boot -Sutures: Removed in 2 weeks. -Medications: Rx for Keflex sent to pharmacy.  She did have significant swelling.  There is no cellulitis and no fluctuance to suggest an abscess but in the event she has developed a small hematoma would not want this to become infected with internal hardware. -Foot redressed.  Return in about 2 weeks (around 08/25/2021) for post op (no x-rays), suture removal.

## 2021-08-25 ENCOUNTER — Ambulatory Visit (INDEPENDENT_AMBULATORY_CARE_PROVIDER_SITE_OTHER): Payer: Medicare HMO | Admitting: Podiatry

## 2021-08-25 DIAGNOSIS — M2031 Hallux varus (acquired), right foot: Secondary | ICD-10-CM

## 2021-08-25 DIAGNOSIS — M2041 Other hammer toe(s) (acquired), right foot: Secondary | ICD-10-CM

## 2021-08-28 ENCOUNTER — Encounter: Payer: Self-pay | Admitting: Podiatry

## 2021-09-07 ENCOUNTER — Telehealth: Payer: Self-pay | Admitting: Internal Medicine

## 2021-09-07 ENCOUNTER — Other Ambulatory Visit: Payer: Self-pay

## 2021-09-07 MED ORDER — BREZTRI AEROSPHERE 160-9-4.8 MCG/ACT IN AERO
2.0000 | INHALATION_SPRAY | Freq: Two times a day (BID) | RESPIRATORY_TRACT | 3 refills | Status: DC
Start: 2021-09-07 — End: 2021-09-07

## 2021-09-07 MED ORDER — BREZTRI AEROSPHERE 160-9-4.8 MCG/ACT IN AERO: 2.0000 | INHALATION_SPRAY | Freq: Two times a day (BID) | RESPIRATORY_TRACT | 3 refills | Status: AC

## 2021-09-07 NOTE — Telephone Encounter (Signed)
Refill sent.  Nothing further needed  

## 2021-09-10 ENCOUNTER — Emergency Department (HOSPITAL_COMMUNITY): Payer: Medicare HMO

## 2021-09-10 ENCOUNTER — Encounter (HOSPITAL_COMMUNITY): Payer: Self-pay | Admitting: *Deleted

## 2021-09-10 ENCOUNTER — Other Ambulatory Visit: Payer: Self-pay

## 2021-09-10 ENCOUNTER — Emergency Department (HOSPITAL_COMMUNITY)
Admission: EM | Admit: 2021-09-10 | Discharge: 2021-09-10 | Disposition: A | Discharge status: Home / Self Care | Payer: Medicare HMO | Attending: Emergency Medicine | Admitting: Emergency Medicine

## 2021-09-10 DIAGNOSIS — M25531 Pain in right wrist: Secondary | ICD-10-CM | POA: Diagnosis not present

## 2021-09-10 DIAGNOSIS — W010XXA Fall on same level from slipping, tripping and stumbling without subsequent striking against object, initial encounter: Secondary | ICD-10-CM | POA: Diagnosis not present

## 2021-09-10 DIAGNOSIS — R519 Headache, unspecified: Secondary | ICD-10-CM | POA: Diagnosis present

## 2021-09-10 DIAGNOSIS — M25532 Pain in left wrist: Secondary | ICD-10-CM | POA: Diagnosis not present

## 2021-09-10 DIAGNOSIS — M545 Low back pain, unspecified: Secondary | ICD-10-CM | POA: Diagnosis not present

## 2021-09-10 DIAGNOSIS — M542 Cervicalgia: Secondary | ICD-10-CM | POA: Diagnosis not present

## 2021-09-10 DIAGNOSIS — W19XXXA Unspecified fall, initial encounter: Secondary | ICD-10-CM

## 2021-09-10 DIAGNOSIS — Z79899 Other long term (current) drug therapy: Secondary | ICD-10-CM | POA: Insufficient documentation

## 2021-09-10 DIAGNOSIS — Z7982 Long term (current) use of aspirin: Secondary | ICD-10-CM | POA: Diagnosis not present

## 2021-09-10 MED ORDER — HYDROCODONE-ACETAMINOPHEN 5-325 MG PO TABS
1.0000 | ORAL_TABLET | Freq: Once | ORAL | Status: AC
  Administered 2021-09-10: 1 via ORAL
  Filled 2021-09-10: qty 1

## 2021-09-10 NOTE — Discharge Instructions (Signed)
As discussed, it is normal to feel worse in the days immediately following a fall regardless of medication use. ° °However, please take all medication as directed, use ice packs liberally.  If you develop any new, or concerning changes in your condition, please return here for further evaluation and management.   ° °Otherwise, please return followup with your physician ° ° ° °

## 2021-09-10 NOTE — ED Provider Notes (Signed)
[facility] EMERGENCY DEPARTMENT Provider Note   CSN: [id number] Arrival date & time: 09/10/21  1842     History  Chief Complaint  Patient presents with   Heather [name]    Heather [name] is a 81 y.o. female.  HPI Patient presents with her 2 sons who assists with the history.  Heather [name] was otherwise well aside from recent right ankle procedure, when Heather [name] had a fall.  Heather [name] notes that her right ankle walking boot got caught in her left foot flip-flop and Heather [name] fell forward.  No loss of consciousness, but since that time Heather [name] has been woozy with head pain, neck pain, bilateral wrist pain and lower back pain.  No weakness or numbness in any extremity.  Heather [name] has not taken her typical dose of medication, Norco since the fall and has severe pain.    Home Medications Prior to Admission medications   Medication Sig Start Date End Date Taking? Authorizing Provider  allopurinol (ZYLOPRIM) 300 MG tablet Take 0.5 tablets (150 mg total) by mouth daily. 02/25/20   Anabel Halon, MD  amlodipine-olmesartan (AZOR) 10-20 MG tablet Take 1 tablet by mouth daily.    [provider]  aspirin EC 81 MG tablet Take 1 tablet (81 mg total) by mouth daily. Swallow whole. 12/15/20 12/15/21  Shahmehdi, Gemma Payor, MD  atorvastatin (LIPITOR) 10 MG tablet Take 1 tablet (10 mg total) by mouth daily. 01/26/20   Anabel Halon, MD  atorvastatin (LIPITOR) 10 MG tablet Take 1 tablet by mouth every morning. 02/09/21   [provider]  Budeson-Glycopyrrol-Formoterol (BREZTRI AEROSPHERE) 160-9-4.8 MCG/ACT AERO Inhale 2 puffs into the lungs 2 (two) times daily. 09/07/21 12/06/21  Nyoka Cowden, MD  cephALEXin (KEFLEX) 500 MG capsule Take 1 capsule (500 mg total) by mouth 3 (three) times daily. 08/11/21   McDonald, Rachelle Hora, DPM  Cholecalciferol (DIALYVITE VITAMIN D 5000 PO) Take by mouth.    [provider]  Cholecalciferol 1.25 MG (50000 UT) TABS Take by mouth.    [provider]  DULoxetine (CYMBALTA) 60 MG capsule  Take 1 capsule (60 mg total) by mouth daily. 12/16/20 01/15/21  Kendell Bane, MD  famotidine (PEPCID) 20 MG tablet One after supper 11/01/20   Nyoka Cowden, MD  famotidine (PEPCID) 20 MG tablet Take by mouth. 11/01/20   [provider]  gabapentin (NEURONTIN) 300 MG capsule Take 1 capsule (300 mg total) by mouth 3 (three) times daily for 7 days. 08/05/21 08/12/21  McDonald, Rachelle Hora, DPM  hydrochlorothiazide (MICROZIDE) 12.5 MG capsule Take 12.5 mg by mouth daily.    [provider]  HYDROcodone-acetaminophen (NORCO/VICODIN) 5-325 MG tablet hydrocodone 5 mg-acetaminophen 325 mg tablet  Take 1 tablet twice a day by oral route as needed.    [provider]  ketoconazole (NIZORAL) 2 % cream Apply 1 application topically daily. 01/18/21   Louann Sjogren, DPM  ketoconazole (NIZORAL) 2 % cream Apply topically. 12/13/20   [provider]  PROAIR HFA 108 (90 BASE) MCG/ACT inhaler Inhale 2 puffs into the lungs every 6 (six) hours as needed for wheezing or shortness of breath.  02/15/14   [provider]  venlafaxine XR (EFFEXOR-XR) 37.5 MG 24 hr capsule Take 37.5 mg by mouth daily. 11/29/20   [provider]  zolpidem (AMBIEN) 10 MG tablet Take 1 tablet (10 mg total) by mouth at bedtime. 05/03/21 06/02/21  Coralyn Helling, MD      Allergies    Lovastatin and Lisinopril  Review of Systems   Review of Systems  All other systems reviewed and are negative.   Physical Exam Updated Vital Signs BP 134/87   Pulse 93   Temp 98.3 F (36.8 C) (Oral)   Resp 14   Ht 5' 2.5" (1.588 m)   Wt 74.8 kg   SpO2 95%   BMI 29.70 kg/m  Physical Exam Vitals and nursing note reviewed.  Constitutional:      General: Heather [name] is not in acute distress.    Appearance: Heather [name] is well-developed.  HENT:     Head: Normocephalic.   Eyes:     Conjunctiva/sclera: Conjunctivae normal.  Neck:     Comments: Range of motion unremarkable no crepitus, step-off Cardiovascular:      Rate and Rhythm: Normal rate and regular rhythm.  Pulmonary:     Effort: Pulmonary effort is normal. No respiratory distress.     Breath sounds: Normal breath sounds. No stridor.  Abdominal:     General: There is no distension.  Skin:    General: Skin is warm and dry.  Neurological:     Mental Status: Heather [name] is alert and oriented to person, place, and time.     Cranial Nerves: No cranial nerve deficit.     Motor: No weakness, tremor, atrophy or abnormal muscle tone.  Psychiatric:        Mood and Affect: Mood normal.     ED Results / Procedures / Treatments   Labs (all labs ordered are listed, but only abnormal results are displayed) Labs Reviewed - No data to display  EKG None  Radiology DG Pelvis 1-2 Views  Result Date: 09/10/2021 CLINICAL DATA:  Recent fall with pelvic pain, initial encounter EXAM: PELVIS - 1 VIEW COMPARISON:  10/28/2019 FINDINGS: Pelvic ring is intact. 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 Screw fragments are again noted on the left and stable. No acute is noted. IVC filter is noted in the lower abdomen. IMPRESSION: No acute abnormality noted. Electronically Signed   By: Alcide Clever M.D.   On: 09/10/2021 21:45   DG Lumbar Spine Complete  Result Date: 09/10/2021 CLINICAL DATA:  Recent fall with low back pain, initial encounter EXAM: LUMBAR SPINE - COMPLETE 4+ VIEW COMPARISON:  10/28/2019 FINDINGS: Five lumbar type vertebral bodies are well visualized. Vertebral body height is well maintained. Mild anterolisthesis of L5 on S1 is noted stable in appearance from the prior exam. Facet hypertrophic changes are seen. Disc space narrowing at L4-5 is noted. IVC filter is noted in place. One of the struts is fractured but stable in appearance. 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 Multiple fractured fixation screws are noted but stable. IMPRESSION: Degenerative change without acute abnormality. Electronically Signed   By: Alcide Clever M.D.   On: 09/10/2021 21:44   DG Wrist  Complete Right  Result Date: 09/10/2021 CLINICAL DATA:  Right wrist pain following fall, initial encounter EXAM: RIGHT WRIST - COMPLETE 3+ VIEW COMPARISON:  None Available. FINDINGS: Mild degenerative changes of the first [facility] joint are noted. No acute fracture or dislocation is seen. No soft tissue changes are noted. IMPRESSION: Degenerative change without acute abnormality. Electronically Signed   By: Alcide Clever M.D.   On: 09/10/2021 21:42   DG Wrist Complete Left  Result Date: 09/10/2021 CLINICAL DATA:  Recent fall with wrist pain, initial encounter EXAM: LEFT WRIST - COMPLETE 3+ VIEW COMPARISON:  None Available. FINDINGS: Mild degenerative changes of the first [facility] joint are seen. No acute fracture or dislocation is noted. No  soft tissue abnormality is seen. IMPRESSION: No acute abnormality noted. Electronically Signed   By: Alcide Clever M.D.   On: 09/10/2021 21:42   CT Cervical Spine Wo Contrast  Result Date: 09/10/2021 CLINICAL DATA:  Polytrauma, blunt EXAM: CT CERVICAL SPINE WITHOUT CONTRAST TECHNIQUE: Multidetector CT imaging of the cervical spine was performed without intravenous contrast. Multiplanar CT image reconstructions were also generated. RADIATION DOSE REDUCTION: This exam was performed according to the departmental dose-optimization program which includes automated exposure control, adjustment of the mA and/or kV according to patient size and/or use of iterative reconstruction technique. COMPARISON:  None Available. FINDINGS: Alignment: Normal Skull base and vertebrae: No acute fracture. No primary bone lesion or focal pathologic process. Soft tissues and spinal canal: No prevertebral fluid or swelling. No visible canal hematoma. 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 Upper chest: No acute findings Other: None IMPRESSION: Diffuse degenerative disc and facet disease. No acute bony abnormality. Electronically Signed   By: Charlett Nose M.D.   On: 09/10/2021 21:38    CT Head Wo Contrast  Result Date: 09/10/2021 CLINICAL DATA:  Polytrauma, blunt.  Fall. EXAM: CT HEAD WITHOUT CONTRAST TECHNIQUE: Contiguous axial images were obtained from the base of the skull through the vertex without intravenous contrast. RADIATION DOSE REDUCTION: This exam was performed according to the departmental dose-optimization program which includes automated exposure control, adjustment of the mA and/or kV according to patient size and/or use of iterative reconstruction technique. COMPARISON:  12/13/2020 FINDINGS: Brain: No acute intracranial abnormality. Specifically, no hemorrhage, hydrocephalus, mass lesion, acute infarction, or significant intracranial injury. Vascular: No hyperdense vessel or unexpected calcification. Skull: No acute calvarial abnormality. Sinuses/Orbits: No acute findings Other: None IMPRESSION: No acute intracranial abnormality. Electronically Signed   By: Charlett Nose M.D.   On: 09/10/2021 21:38    Procedures Procedures    Medications Ordered in ED Medications  HYDROcodone-acetaminophen (NORCO/VICODIN) 5-325 MG per tablet 1 tablet (1 tablet Oral Given 09/10/21 2109)    ED Course/ Medical Decision Making/ A&P This patient with a Hx of elderly female with arthritis, recent foot procedure presents after mechanical fall with pain in multiple areas, this involves an extensive number of treatment options, and is a complaint that carries with it a high risk of complications and morbidity.    The differential diagnosis includes cranial hemorrhage, fractures, nervous system disruption   Social Determinants of Health:  Advanced age  Additional history obtained:  Additional history and/or information obtained from children, notable for HPI details above   After the initial evaluation, orders, including: X-ray, CT, Vicodin were initiated.   Patient placed on Cardiac and Pulse-Oximetry Monitors. The patient was maintained on a cardiac monitor.  The cardiac  monitored showed an rhythm of 100 sinus tach abnormal The patient was also maintained on pulse oximetry. The readings were typically 100% room air normal   On repeat evaluation of the patient improved   Imaging Studies ordered:  I independently visualized and interpreted imaging which showed no acute intracranial abnormalities, no fracture on x-ray, no cervical spine fracture I agree with the radiologist interpretation  10:30 PM Patient awake, alert, smiling, aware of all findings Dispostion / Final MDM:  After consideration of the diagnostic results and the patient's response to treatment, this adult female presenting after mechanical fall is neurologically unremarkable, but given advanced age, arthritis history, pain, multiple imaging studies were performed, these were reassuring, patient discharged in stable condition to follow-up in clinic.  Final Clinical Impression(s) / ED  Diagnoses Final diagnoses:  Fall, initial encounter    Rx / DC Orders ED Discharge Orders     None         Gerhard Munch, MD 09/10/21 2231

## 2021-09-10 NOTE — ED Triage Notes (Signed)
Pt fell today after getting her boot caught in her flipflop earlier today.  Felt dizzy afterwards, still feel some dizziness.  Denies LOC, denies any blood thinners.

## 2021-09-13 ENCOUNTER — Ambulatory Visit: Payer: Medicare HMO | Admitting: Pulmonary Disease

## 2021-09-15 ENCOUNTER — Ambulatory Visit (INDEPENDENT_AMBULATORY_CARE_PROVIDER_SITE_OTHER): Payer: Medicare HMO

## 2021-09-15 ENCOUNTER — Ambulatory Visit (INDEPENDENT_AMBULATORY_CARE_PROVIDER_SITE_OTHER): Payer: Medicare HMO | Admitting: Podiatry

## 2021-09-15 DIAGNOSIS — M2031 Hallux varus (acquired), right foot: Secondary | ICD-10-CM

## 2021-09-15 DIAGNOSIS — M2041 Other hammer toe(s) (acquired), right foot: Secondary | ICD-10-CM

## 2021-09-15 DIAGNOSIS — M21961 Unspecified acquired deformity of right lower leg: Secondary | ICD-10-CM

## 2021-09-15 DIAGNOSIS — Z9889 Other specified postprocedural states: Secondary | ICD-10-CM

## 2021-09-15 NOTE — Progress Notes (Signed)
  Subjective:  Patient ID: Heather [name], female    DOB: 01-08-1941,  MRN: [id number]  Chief Complaint  Patient presents with   Post-op Follow-up    Post op visit #2 and pin removal     81 y.o. female returns for post-op check.  She is still doing well feels like it is healing okay.  The fifth toe pin came out unexpectedly.  Overall pleased with alignment and reduction in pain  Review of Systems: Negative except as noted in the HPI. Denies N/V/F/Ch.   Objective:  There were no vitals filed for this visit. There is no height or weight on file to calculate BMI. Constitutional Well developed. Well nourished.  Vascular Foot warm and well perfused. Capillary refill normal to all digits.  Calf is soft and supple, no posterior calf or knee pain, negative Homans' sign  Neurologic Normal speech. Oriented to person, place, and time. Epicritic sensation to light touch grossly present bilaterally.  Dermatologic Incisions are well-healed nonhypertrophic  Orthopedic: She has minimal edema.  No pain to palpation.   Multiple view plain film radiographs: Internal hardware and Kirschner wires intact, the fifth metatarsal wire has been removed intervally, some consolidation across arthrodesis site noted Assessment:   1. Hallux varus, right   2. Post-operative state   3. Hammertoe of right foot   4. Deformity of metatarsal bone of right foot    Plan:  Patient was evaluated and treated and all questions answered.  S/p foot surgery right -Overall doing well.  The remaining Kirschner wires were removed uneventfully.  She should continue WBAT in the cam boot for now for protection of the arthrodesis site there is still further fusion and needs to occur.  I will see her back in 4 weeks for new radiographs.  Return in about 4 weeks (around 10/13/2021) for post op (new x-rays).

## 2021-10-07 ENCOUNTER — Emergency Department (HOSPITAL_COMMUNITY): Payer: Medicare HMO

## 2021-10-07 ENCOUNTER — Observation Stay (HOSPITAL_COMMUNITY)
Admission: EM | Admit: 2021-10-07 | Discharge: 2021-10-10 | Disposition: A | Discharge status: Home / Self Care | Payer: Medicare HMO | Attending: Internal Medicine | Admitting: Internal Medicine

## 2021-10-07 ENCOUNTER — Other Ambulatory Visit: Payer: Self-pay

## 2021-10-07 ENCOUNTER — Encounter (HOSPITAL_COMMUNITY): Payer: Self-pay | Admitting: Emergency Medicine

## 2021-10-07 ENCOUNTER — Observation Stay (HOSPITAL_BASED_OUTPATIENT_CLINIC_OR_DEPARTMENT_OTHER): Payer: Medicare HMO

## 2021-10-07 DIAGNOSIS — I429 Cardiomyopathy, unspecified: Secondary | ICD-10-CM | POA: Insufficient documentation

## 2021-10-07 DIAGNOSIS — Z86718 Personal history of other venous thrombosis and embolism: Secondary | ICD-10-CM | POA: Insufficient documentation

## 2021-10-07 DIAGNOSIS — Z79899 Other long term (current) drug therapy: Secondary | ICD-10-CM | POA: Insufficient documentation

## 2021-10-07 DIAGNOSIS — Z888 Allergy status to other drugs, medicaments and biological substances status: Secondary | ICD-10-CM | POA: Insufficient documentation

## 2021-10-07 DIAGNOSIS — I358 Other nonrheumatic aortic valve disorders: Secondary | ICD-10-CM | POA: Insufficient documentation

## 2021-10-07 DIAGNOSIS — M545 Low back pain, unspecified: Secondary | ICD-10-CM | POA: Diagnosis not present

## 2021-10-07 DIAGNOSIS — I700 Atherosclerosis of aorta: Secondary | ICD-10-CM | POA: Insufficient documentation

## 2021-10-07 DIAGNOSIS — G8929 Other chronic pain: Secondary | ICD-10-CM | POA: Diagnosis not present

## 2021-10-07 DIAGNOSIS — Z96651 Presence of right artificial knee joint: Secondary | ICD-10-CM | POA: Insufficient documentation

## 2021-10-07 DIAGNOSIS — E785 Hyperlipidemia, unspecified: Secondary | ICD-10-CM | POA: Insufficient documentation

## 2021-10-07 DIAGNOSIS — N1831 Chronic kidney disease, stage 3a: Secondary | ICD-10-CM | POA: Insufficient documentation

## 2021-10-07 DIAGNOSIS — Z7982 Long term (current) use of aspirin: Secondary | ICD-10-CM | POA: Diagnosis not present

## 2021-10-07 DIAGNOSIS — F419 Anxiety disorder, unspecified: Secondary | ICD-10-CM | POA: Diagnosis not present

## 2021-10-07 DIAGNOSIS — F32A Depression, unspecified: Secondary | ICD-10-CM | POA: Insufficient documentation

## 2021-10-07 DIAGNOSIS — R7989 Other specified abnormal findings of blood chemistry: Secondary | ICD-10-CM | POA: Diagnosis not present

## 2021-10-07 DIAGNOSIS — J9601 Acute respiratory failure with hypoxia: Secondary | ICD-10-CM | POA: Diagnosis not present

## 2021-10-07 DIAGNOSIS — R0602 Shortness of breath: Secondary | ICD-10-CM | POA: Diagnosis present

## 2021-10-07 DIAGNOSIS — R931 Abnormal findings on diagnostic imaging of heart and coronary circulation: Secondary | ICD-10-CM | POA: Diagnosis not present

## 2021-10-07 DIAGNOSIS — I5021 Acute systolic (congestive) heart failure: Secondary | ICD-10-CM | POA: Diagnosis present

## 2021-10-07 DIAGNOSIS — Z96612 Presence of left artificial shoulder joint: Secondary | ICD-10-CM | POA: Diagnosis not present

## 2021-10-07 DIAGNOSIS — Z833 Family history of diabetes mellitus: Secondary | ICD-10-CM | POA: Insufficient documentation

## 2021-10-07 DIAGNOSIS — R000 Tachycardia, unspecified: Secondary | ICD-10-CM | POA: Insufficient documentation

## 2021-10-07 DIAGNOSIS — K219 Gastro-esophageal reflux disease without esophagitis: Secondary | ICD-10-CM | POA: Diagnosis present

## 2021-10-07 DIAGNOSIS — Z96643 Presence of artificial hip joint, bilateral: Secondary | ICD-10-CM | POA: Diagnosis not present

## 2021-10-07 DIAGNOSIS — I169 Hypertensive crisis, unspecified: Secondary | ICD-10-CM | POA: Diagnosis not present

## 2021-10-07 DIAGNOSIS — Z8673 Personal history of transient ischemic attack (TIA), and cerebral infarction without residual deficits: Secondary | ICD-10-CM | POA: Diagnosis not present

## 2021-10-07 DIAGNOSIS — Z8249 Family history of ischemic heart disease and other diseases of the circulatory system: Secondary | ICD-10-CM | POA: Insufficient documentation

## 2021-10-07 DIAGNOSIS — M109 Gout, unspecified: Secondary | ICD-10-CM | POA: Diagnosis not present

## 2021-10-07 DIAGNOSIS — Z9181 History of falling: Secondary | ICD-10-CM | POA: Diagnosis not present

## 2021-10-07 DIAGNOSIS — J90 Pleural effusion, not elsewhere classified: Secondary | ICD-10-CM | POA: Insufficient documentation

## 2021-10-07 DIAGNOSIS — J441 Chronic obstructive pulmonary disease with (acute) exacerbation: Secondary | ICD-10-CM | POA: Insufficient documentation

## 2021-10-07 DIAGNOSIS — J449 Chronic obstructive pulmonary disease, unspecified: Secondary | ICD-10-CM | POA: Diagnosis present

## 2021-10-07 DIAGNOSIS — R0603 Acute respiratory distress: Secondary | ICD-10-CM

## 2021-10-07 DIAGNOSIS — Z87891 Personal history of nicotine dependence: Secondary | ICD-10-CM | POA: Diagnosis not present

## 2021-10-07 DIAGNOSIS — N1832 Chronic kidney disease, stage 3b: Secondary | ICD-10-CM | POA: Diagnosis present

## 2021-10-07 DIAGNOSIS — I340 Nonrheumatic mitral (valve) insufficiency: Secondary | ICD-10-CM | POA: Insufficient documentation

## 2021-10-07 DIAGNOSIS — M1A9XX0 Chronic gout, unspecified, without tophus (tophi): Secondary | ICD-10-CM

## 2021-10-07 DIAGNOSIS — N184 Chronic kidney disease, stage 4 (severe): Secondary | ICD-10-CM | POA: Diagnosis present

## 2021-10-07 DIAGNOSIS — Z7951 Long term (current) use of inhaled steroids: Secondary | ICD-10-CM | POA: Insufficient documentation

## 2021-10-07 DIAGNOSIS — R9431 Abnormal electrocardiogram [ECG] [EKG]: Secondary | ICD-10-CM | POA: Insufficient documentation

## 2021-10-07 DIAGNOSIS — I447 Left bundle-branch block, unspecified: Secondary | ICD-10-CM | POA: Insufficient documentation

## 2021-10-07 DIAGNOSIS — E875 Hyperkalemia: Secondary | ICD-10-CM | POA: Diagnosis present

## 2021-10-07 DIAGNOSIS — I959 Hypotension, unspecified: Secondary | ICD-10-CM | POA: Insufficient documentation

## 2021-10-07 DIAGNOSIS — Z7984 Long term (current) use of oral hypoglycemic drugs: Secondary | ICD-10-CM | POA: Insufficient documentation

## 2021-10-07 DIAGNOSIS — I130 Hypertensive heart and chronic kidney disease with heart failure and stage 1 through stage 4 chronic kidney disease, or unspecified chronic kidney disease: Principal | ICD-10-CM | POA: Insufficient documentation

## 2021-10-07 DIAGNOSIS — I10 Essential (primary) hypertension: Secondary | ICD-10-CM | POA: Diagnosis present

## 2021-10-07 LAB — ECHOCARDIOGRAM COMPLETE
AR max vel: 1.8 cm2
AV Area VTI: 1.48 cm2
AV Area mean vel: 1.7 cm2
AV Mean grad: 2 mmHg
AV Peak grad: 5.5 mmHg
Ao pk vel: 1.17 m/s
Area-P 1/2: 6.71 cm2
Height: 62.5 in
MV VTI: 1.24 cm2
S' Lateral: 4.8 cm
Weight: 2617.6 oz

## 2021-10-07 LAB — PHOSPHORUS: Phosphorus: 3.3 mg/dL (ref 2.5–4.6)

## 2021-10-07 LAB — MAGNESIUM: Magnesium: 1.6 mg/dL — ABNORMAL LOW (ref 1.7–2.4)

## 2021-10-07 LAB — TSH: TSH: 0.983 u[IU]/mL (ref 0.350–4.500)

## 2021-10-07 LAB — BRAIN NATRIURETIC PEPTIDE: B Natriuretic Peptide: 1731 pg/mL — ABNORMAL HIGH (ref 0.0–100.0)

## 2021-10-07 LAB — D-DIMER, QUANTITATIVE: D-Dimer, Quant: <0.27 ug/mL-FEU (ref 0.00–0.50)

## 2021-10-07 MED ORDER — ALBUTEROL SULFATE (2.5 MG/3ML) 0.083% IN NEBU
2.5000 mg/h | INHALATION_SOLUTION | RESPIRATORY_TRACT | Status: AC
  Administered 2021-10-07: 2.5 mg/h via RESPIRATORY_TRACT
  Filled 2021-10-07 (×2): qty 3

## 2021-10-07 MED ORDER — ALLOPURINOL 300 MG PO TABS
150.0000 mg | ORAL_TABLET | Freq: Every day | ORAL | Status: DC
  Administered 2021-10-08 – 2021-10-10 (×3): 150 mg via ORAL
  Filled 2021-10-07 (×4): qty 1

## 2021-10-07 MED ORDER — SODIUM CHLORIDE 0.9 % IV SOLN: 250.0000 mL | INTRAVENOUS | Status: DC | PRN

## 2021-10-07 MED ORDER — ACETAMINOPHEN 650 MG RE SUPP: 650.0000 mg | Freq: Four times a day (QID) | RECTAL | Status: DC | PRN

## 2021-10-07 MED ORDER — FUROSEMIDE 40 MG PO TABS
40.0000 mg | ORAL_TABLET | Freq: Two times a day (BID) | ORAL | Status: DC
  Administered 2021-10-07 – 2021-10-08 (×3): 40 mg via ORAL
  Filled 2021-10-07 (×3): qty 1

## 2021-10-07 MED ORDER — ENSURE ENLIVE PO LIQD
237.0000 mL | Freq: Two times a day (BID) | ORAL | Status: DC
  Administered 2021-10-07 – 2021-10-10 (×6): 237 mL via ORAL

## 2021-10-07 MED ORDER — MOMETASONE FURO-FORMOTEROL FUM 100-5 MCG/ACT IN AERO
2.0000 | INHALATION_SPRAY | Freq: Two times a day (BID) | RESPIRATORY_TRACT | Status: DC
  Administered 2021-10-07 – 2021-10-10 (×6): 2 via RESPIRATORY_TRACT
  Filled 2021-10-07: qty 8.8

## 2021-10-07 MED ORDER — FUROSEMIDE 10 MG/ML IJ SOLN
40.0000 mg | Freq: Once | INTRAMUSCULAR | Status: AC
Start: 2021-10-07 — End: 2021-10-07
  Administered 2021-10-07: 40 mg via INTRAVENOUS
  Filled 2021-10-07: qty 4

## 2021-10-07 MED ORDER — UMECLIDINIUM BROMIDE 62.5 MCG/ACT IN AEPB
1.0000 | INHALATION_SPRAY | Freq: Every day | RESPIRATORY_TRACT | Status: DC
  Administered 2021-10-07 – 2021-10-10 (×4): 1 via RESPIRATORY_TRACT
  Filled 2021-10-07: qty 7

## 2021-10-07 MED ORDER — DEXTROSE 50 % IV SOLN
1.0000 | Freq: Once | INTRAVENOUS | Status: AC
  Administered 2021-10-07: 50 mL via INTRAVENOUS
  Filled 2021-10-07: qty 50

## 2021-10-07 MED ORDER — PERFLUTREN LIPID MICROSPHERE
1.0000 mL | INTRAVENOUS | Status: AC | PRN
  Administered 2021-10-07: 6 mL via INTRAVENOUS
  Filled 2021-10-07: qty 10

## 2021-10-07 MED ORDER — OXYCODONE HCL 5 MG PO TABS
5.0000 mg | ORAL_TABLET | Freq: Two times a day (BID) | ORAL | Status: DC | PRN
  Administered 2021-10-07 – 2021-10-10 (×5): 5 mg via ORAL
  Filled 2021-10-07 (×5): qty 1

## 2021-10-07 MED ORDER — ALBUTEROL SULFATE (2.5 MG/3ML) 0.083% IN NEBU
INHALATION_SOLUTION | RESPIRATORY_TRACT | Status: AC
  Filled 2021-10-07: qty 3

## 2021-10-07 MED ORDER — ONDANSETRON HCL 4 MG/2ML IJ SOLN: 4.0000 mg | Freq: Four times a day (QID) | INTRAMUSCULAR | Status: DC | PRN

## 2021-10-07 MED ORDER — ACETAMINOPHEN 325 MG PO TABS
650.0000 mg | ORAL_TABLET | Freq: Four times a day (QID) | ORAL | Status: DC | PRN
  Administered 2021-10-08 – 2021-10-09 (×2): 650 mg via ORAL
  Filled 2021-10-07 (×2): qty 2

## 2021-10-07 MED ORDER — INSULIN ASPART 100 UNIT/ML IV SOLN
5.0000 [IU] | Freq: Once | INTRAVENOUS | Status: AC
  Administered 2021-10-07: 5 [IU] via INTRAVENOUS

## 2021-10-07 MED ORDER — FAMOTIDINE 20 MG PO TABS
20.0000 mg | ORAL_TABLET | Freq: Every day | ORAL | Status: DC
  Administered 2021-10-07 – 2021-10-09 (×3): 20 mg via ORAL
  Filled 2021-10-07 (×3): qty 1

## 2021-10-07 MED ORDER — HEPARIN SODIUM (PORCINE) 5000 UNIT/ML IJ SOLN
5000.0000 [IU] | Freq: Three times a day (TID) | INTRAMUSCULAR | Status: DC
  Administered 2021-10-07 – 2021-10-10 (×9): 5000 [IU] via SUBCUTANEOUS
  Filled 2021-10-07 (×9): qty 1

## 2021-10-07 MED ORDER — SODIUM ZIRCONIUM CYCLOSILICATE 5 G PO PACK
10.0000 g | PACK | Freq: Once | ORAL | Status: AC
  Administered 2021-10-07: 10 g via ORAL
  Filled 2021-10-07: qty 2

## 2021-10-07 MED ORDER — VENLAFAXINE HCL ER 37.5 MG PO CP24
37.5000 mg | ORAL_CAPSULE | Freq: Every day | ORAL | Status: DC
  Administered 2021-10-07 – 2021-10-10 (×4): 37.5 mg via ORAL
  Filled 2021-10-07 (×6): qty 1

## 2021-10-07 MED ORDER — METHYLPREDNISOLONE SODIUM SUCC 125 MG IJ SOLR
125.0000 mg | Freq: Once | INTRAMUSCULAR | Status: AC
  Administered 2021-10-07: 125 mg via INTRAVENOUS
  Filled 2021-10-07: qty 2

## 2021-10-07 MED ORDER — SODIUM CHLORIDE 0.9% FLUSH
3.0000 mL | Freq: Two times a day (BID) | INTRAVENOUS | Status: DC
  Administered 2021-10-07 – 2021-10-10 (×7): 3 mL via INTRAVENOUS

## 2021-10-07 MED ORDER — HYDRALAZINE HCL 25 MG PO TABS
25.0000 mg | ORAL_TABLET | Freq: Three times a day (TID) | ORAL | Status: DC
  Administered 2021-10-08 – 2021-10-10 (×6): 25 mg via ORAL
  Filled 2021-10-07 (×8): qty 1

## 2021-10-07 MED ORDER — ATORVASTATIN CALCIUM 10 MG PO TABS
10.0000 mg | ORAL_TABLET | Freq: Every day | ORAL | Status: DC
  Administered 2021-10-07 – 2021-10-10 (×4): 10 mg via ORAL
  Filled 2021-10-07 (×4): qty 1

## 2021-10-07 MED ORDER — ALBUTEROL SULFATE HFA 108 (90 BASE) MCG/ACT IN AERS: 2.0000 | INHALATION_SPRAY | Freq: Four times a day (QID) | RESPIRATORY_TRACT | Status: DC | PRN

## 2021-10-07 MED ORDER — ALBUTEROL SULFATE (2.5 MG/3ML) 0.083% IN NEBU
2.5000 mg | INHALATION_SOLUTION | RESPIRATORY_TRACT | Status: DC | PRN
  Administered 2021-10-07: 2.5 mg via RESPIRATORY_TRACT

## 2021-10-07 MED ORDER — ASPIRIN 81 MG PO TBEC
81.0000 mg | DELAYED_RELEASE_TABLET | Freq: Every day | ORAL | Status: DC
  Administered 2021-10-08 – 2021-10-10 (×3): 81 mg via ORAL
  Filled 2021-10-07 (×4): qty 1

## 2021-10-07 MED ORDER — BUDESON-GLYCOPYRROL-FORMOTEROL 160-9-4.8 MCG/ACT IN AERO: 2.0000 | INHALATION_SPRAY | Freq: Two times a day (BID) | RESPIRATORY_TRACT | Status: DC

## 2021-10-07 MED ORDER — AMLODIPINE BESYLATE 5 MG PO TABS
5.0000 mg | ORAL_TABLET | Freq: Every day | ORAL | Status: DC
  Administered 2021-10-08: 5 mg via ORAL
  Filled 2021-10-07 (×2): qty 1

## 2021-10-07 MED ORDER — ONDANSETRON HCL 4 MG PO TABS: 4.0000 mg | ORAL_TABLET | Freq: Four times a day (QID) | ORAL | Status: DC | PRN

## 2021-10-07 MED ORDER — SODIUM CHLORIDE 0.9% FLUSH: 3.0000 mL | INTRAVENOUS | Status: DC | PRN

## 2021-10-07 MED ORDER — ISOSORBIDE MONONITRATE ER 60 MG PO TB24
30.0000 mg | ORAL_TABLET | Freq: Every day | ORAL | Status: DC
  Administered 2021-10-07 – 2021-10-10 (×4): 30 mg via ORAL
  Filled 2021-10-07 (×4): qty 1

## 2021-10-07 NOTE — Assessment & Plan Note (Addendum)
-  Continue the use of pepcid -Lifestyle changes discussed with patient.[name]

## 2021-10-07 NOTE — Assessment & Plan Note (Signed)
-  Uncontrolled and elevated at time of admission -currently stable with current regimen -continue to follow VS -Heart healthy/low-sodium diet discussed with patient.

## 2021-10-07 NOTE — Assessment & Plan Note (Addendum)
-  Potassium 6.2 at time of admission -No acute abnormalities appreciated on EKG or telemetry; patient received treatment with Lasix, insulin, bicarbonate and Lokelma -Potassium within normal limits at discharge. -Continue to follow electrolytes level as an outpatient.

## 2021-10-07 NOTE — ED Provider Notes (Signed)
[facility] EMERGENCY DEPARTMENT Provider Note   CSN: [id number] Arrival date & time: 10/07/21  1134     History  Chief Complaint  Patient presents with   Shortness of Breath    Heather [name] is a 81 y.o. female.  HPI Patient presents with a female companion who assists with the history.  In addition, I saw and evaluated the patient 1 month ago after she presented for mechanical fall.  She notes that in the interval she has been doing generally well, but over the past day she has a dyspnea.  She has been using her inhaler, including this morning, and recently had a change in her meds, per pulmonology decreasing from frequency of twice daily to once.  She is unsure of which medication that is a bit changed, however. No pain, no fever.  Dyspnea has been worsening over the course the day in spite of using her medication as above.    Home Medications Prior to Admission medications   Medication Sig Start Date End Date Taking? Authorizing Provider  allopurinol (ZYLOPRIM) 300 MG tablet Take 0.5 tablets (150 mg total) by mouth daily. 02/25/20   Anabel Halon, MD  amLODipine-olmesartan (AZOR) 5-20 MG tablet Take 1 tablet by mouth daily. 10/05/21   [provider]  aspirin EC 81 MG tablet Take 1 tablet (81 mg total) by mouth daily. Swallow whole. 12/15/20 12/15/21  Shahmehdi, Gemma Payor, MD  atorvastatin (LIPITOR) 10 MG tablet Take 1 tablet (10 mg total) by mouth daily. 01/26/20   Anabel Halon, MD  atorvastatin (LIPITOR) 10 MG tablet Take 1 tablet by mouth every morning. 02/09/21   [provider]  Budeson-Glycopyrrol-Formoterol (BREZTRI AEROSPHERE) 160-9-4.8 MCG/ACT AERO Inhale 2 puffs into the lungs 2 (two) times daily. 09/07/21 12/06/21  Nyoka Cowden, MD  cephALEXin (KEFLEX) 500 MG capsule Take 1 capsule (500 mg total) by mouth 3 (three) times daily. 08/11/21   McDonald, Rachelle Hora, DPM  Cholecalciferol (DIALYVITE VITAMIN D 5000 PO) Take by mouth.    [provider]   Cholecalciferol 1.25 MG (50000 UT) TABS Take by mouth.    [provider]  DULoxetine (CYMBALTA) 60 MG capsule Take 1 capsule (60 mg total) by mouth daily. 12/16/20 01/15/21  Kendell Bane, MD  famotidine (PEPCID) 20 MG tablet One after supper 11/01/20   Nyoka Cowden, MD  famotidine (PEPCID) 20 MG tablet Take by mouth. 11/01/20   [provider]  gabapentin (NEURONTIN) 300 MG capsule Take 1 capsule (300 mg total) by mouth 3 (three) times daily for 7 days. 08/05/21 08/12/21  McDonald, Rachelle Hora, DPM  hydrochlorothiazide (HYDRODIURIL) 12.5 MG tablet Take 1 tablet by mouth daily. 09/14/21   [provider]  HYDROcodone-acetaminophen (NORCO/VICODIN) 5-325 MG tablet hydrocodone 5 mg-acetaminophen 325 mg tablet  Take 1 tablet twice a day by oral route as needed.    [provider]  ketoconazole (NIZORAL) 2 % cream Apply 1 application topically daily. 01/18/21   Louann Sjogren, DPM  ketoconazole (NIZORAL) 2 % cream Apply topically. 12/13/20   [provider]  PROAIR HFA 108 (90 BASE) MCG/ACT inhaler Inhale 2 puffs into the lungs every 6 (six) hours as needed for wheezing or shortness of breath.  02/15/14   [provider]  venlafaxine XR (EFFEXOR-XR) 37.5 MG 24 hr capsule Take 37.5 mg by mouth daily. 11/29/20   [provider]  zolpidem (AMBIEN) 10 MG tablet Take 1 tablet (10 mg total) by mouth at bedtime. 05/03/21 06/02/21  Coralyn Helling, MD      Allergies    Lovastatin and Lisinopril    Review of Systems   Review of Systems  All other systems reviewed and are negative.   Physical Exam Updated Vital Signs BP 126/83   Pulse (!) 101   Temp 97.9 F (36.6 C) (Oral)   Resp (!) 21   Ht 5' 2.5" (1.588 m)   Wt 74.2 kg   SpO2 95%   BMI 29.45 kg/m  Physical Exam Vitals and nursing note reviewed.  Constitutional:      General: She is not in acute distress.    Appearance: She is well-developed.  HENT:     Head: Normocephalic and  atraumatic.  Eyes:     Conjunctiva/sclera: Conjunctivae normal.  Cardiovascular:     Rate and Rhythm: Normal rate and regular rhythm.  Pulmonary:     Effort: Pulmonary effort is normal. No respiratory distress.     Breath sounds: Normal breath sounds. No stridor.  Abdominal:     General: There is no distension.  Skin:    General: Skin is warm and dry.  Neurological:     Mental Status: She is alert and oriented to person, place, and time.     Cranial Nerves: No cranial nerve deficit.  Psychiatric:        Mood and Affect: Mood normal.     ED Results / Procedures / Treatments   Labs (all labs ordered are listed, but only abnormal results are displayed) Labs Reviewed  CBC WITH DIFFERENTIAL/PLATELET  BRAIN NATRIURETIC PEPTIDE  COMPREHENSIVE METABOLIC PANEL  D-DIMER, QUANTITATIVE    EKG None  Radiology DG Chest Portable 1 View  Result Date: 10/07/2021 CLINICAL DATA:  Shortness of breath with exertion x2 weeks. EXAM: PORTABLE CHEST 1 VIEW COMPARISON:  Chest radiograph Aug 02, 2021 FINDINGS: Enlarged cardiac silhouette with central vascular prominence. Tortuous atherosclerotic thoracic aorta. Reduced lung volumes. Mild interstitial opacities. Small left pleural effusion. The visualized skeletal structures are unremarkable. IMPRESSION: Enlarged cardiac silhouette with central vascular prominence and probable mild interstitial edema with a small left pleural effusion. Aortic Atherosclerosis (ICD10-I70.0). Electronically Signed   By: Maudry Mayhew M.D.   On: 10/07/2021 12:45    Procedures Procedures    Medications Ordered in ED Medications - No data to display  ED Course/ Medical Decision Making/ A&P This patient with a Hx of COPD, DVT in the very distant past, hypertension presents to the ED for concern of dyspnea, this involves an extensive number of treatment options, and is a complaint that carries with it a high risk of complications and morbidity.    The differential  diagnosis includes COPD exacerbation, pneumonia, less likely PE   Social Determinants of Health:  Advanced age  Additional history obtained:  Additional history and/or information obtained from female companion at bedside and chart, notable for companion for HPI.  Chart review notable for evaluation here 1 month ago following a mechanical fall, which I evaluated patient for pain. In addition, patient had echocardiogram in 2022 following stroke, results below:  IMPRESSIONS     1. Left ventricular ejection fraction, by estimation, is 50 to 55%. The  left ventricle has low normal function. The left ventricle demonstrates  regional wall motion abnormalities (see scoring diagram/findings for  description). Left ventricular diastolic   parameters are consistent with Grade I diastolic dysfunction (impaired  relaxation).   2. Right ventricular systolic function is normal. The right ventricular  size is normal.   3. Left atrial  size was mildly dilated.   4. The mitral valve is normal in structure. No evidence of mitral valve  regurgitation. No evidence of mitral stenosis.   5. The aortic valve is tricuspid. Aortic valve regurgitation is not  visualized. Mild aortic valve sclerosis is present, with no evidence of  aortic valve stenosis.   6. The inferior vena cava is normal in size with greater than 50%  respiratory variability, suggesting right atrial pressure of 3 mmHg.    After the initial evaluation, orders, including: X-ray labs were initiated.  Bronchodilator   Patient placed on Cardiac and Pulse-Oximetry Monitors. The patient was maintained on a cardiac monitor.  The cardiac monitored showed an rhythm of 105 sinus tach abnormal The patient was also maintained on pulse oximetry. The readings were typically 99% room air normal   On repeat evaluation of the patient stayed the same  Lab Tests:  I personally interpreted labs.  The pertinent results include: Hyperkalemia 6.2, and  BNP notably 1700.  Imaging Studies ordered:  I independently visualized and interpreted imaging which showed fluid overload status on chest x-ray I agree with the radiologist interpretation  Consultations Obtained:  I requested consultation with the cardiology,  and discussed lab and imaging findings as well as pertinent plan - they recommend: Admission for echo, initiation of therapy  Dispostion / Final MDM:  After consideration of the diagnostic results and the patient's response to treatment, adult female with a history of multiple medical issues, but no known history of heart failure presents with 2 weeks of dyspnea.  D-dimer is negative, though she has a history of DVT in the distant distant past, lower suspicion for PE.  No evidence for pneumonia.  The patient does not have new oxygen requirement, she has evidence of new heart failure with BNP greater than 1700, abnormal chest x-ray.  Patient also found to have substantial abnormalities in potassium requiring insulin, glucose, Lasix, Lokelma.  After ordering echocardiogram, discussed with cardiology and internal medicine the patient was admitted for further monitoring, management.  Final Clinical Impression(s) / ED Diagnoses Final diagnoses:  SOBOE (shortness of breath on exertion)  Hyperkalemia  CRITICAL CARE Performed by: Carmin Muskrat Total critical care time: 35 minutes Critical care time was exclusive of separately billable procedures and treating other patients. Critical care was necessary to treat or prevent imminent or life-threatening deterioration. Critical care was time spent personally by me on the following activities: development of treatment plan with patient and/or surrogate as well as nursing, discussions with consultants, evaluation of patient's response to treatment, examination of patient, obtaining history from patient or surrogate, ordering and performing treatments and interventions, ordering and review of  laboratory studies, ordering and review of radiographic studies, pulse oximetry and re-evaluation of patient's condition.    Carmin Muskrat, MD 10/07/21 [phone]

## 2021-10-07 NOTE — Assessment & Plan Note (Addendum)
-  Overall mood stable -No suicidal ideation or hallucinations appreciated. -Continue as needed anxiolytics and continue the use of Effexor and Cymbalta.

## 2021-10-07 NOTE — H&P (Signed)
History and Physical    Patient: Heather [name] EYC:[id number] DOB: [birth year] DOA: 10/07/2021 DOS: the patient was seen and examined on 10/07/2021 PCP: Pcp, No  Patient coming from: Home  Chief Complaint:  Chief Complaint  Patient presents with   Shortness of Breath   HPI: Heather [name] is a 81 y.o. female with medical history significant of hypertension, anxiety/depression, gout, history of COPD, prior history of stroke and chronic lower back pain; presented to the hospital secondary to shortness of breath especially on exertion for the last 7-10 days prior to admission.  Patient reports symptoms started gradually but progressing.  No associated chest pain or palpitations has been reported.  On the day prior to admission she expressed feeling some orthopnea.  Had not been any sick contacts and she had been compliant with her medications.  Despite multiple uses of her home bronchodilators inhalers she continued to feel short of breath and came to the hospital for further evaluation and management. Patient reports no focal weakness, no dysuria, no hematuria, no fever, no chills, no abdominal pain, no melena, no hematochezia or any other complaints.  Work-up demonstrating elevated BNP, chest x-ray with vascular congestion and presumed interstitial edema.  Lasix has been provided and cardiology consulted. TR[facility] contacted to place in the hospital for further relation and management.  Review of Systems: As mentioned in the history of present illness. All other systems reviewed and are negative. Past Medical History:  Diagnosis Date   Anemia    Anxiety    Arthritis    COPD (chronic obstructive pulmonary disease) (HCC)    CVA (cerebral vascular accident) (HCC)    Depression    Dry eyes 04/14/2014   GERD (gastroesophageal reflux disease)    Glaucoma    Gout    HTN (hypertension)    OSA (obstructive sleep apnea)    Past Surgical History:  Procedure Laterality Date   ABDOMINAL HYSTERECTOMY      BACK SURGERY     lumbar-disc   BUNIONECTOMY Bilateral    FOOT SURGERY Left    repair of "kissing Cousins"   JOINT REPLACEMENT Bilateral    hip    JOINT REPLACEMENT Right 2010 or 2012   knee   KNEE SURGERY     SHOULDER ARTHROSCOPY Right    shoulder surgery in [place] about 2000   SHOULDER HEMI-ARTHROPLASTY Left 03/25/2014   Procedure: LEFT SHOULDER HEMI-ARTHROPLASTY;  Surgeon: Vickki Hearing, MD;  Location: [facility];  Service: Orthopedics;  Laterality: Left;   Social History:  reports that she quit smoking about 36 years ago. Her smoking use included cigarettes. She has a 40.00 pack-year smoking history. She has never used smokeless tobacco. She reports that she does not drink alcohol and does not use drugs.  Allergies  Allergen Reactions   Lovastatin Swelling    Patient states that her tongue swells and she gets a tingling feeling all over. Patient states that her tongue swells and she gets a tingling feeling all over.   Lisinopril     Family History  Problem Relation Age of Onset   High blood pressure Mother    Aneurysm Father    Diabetes Brother    Pancreatitis Brother    Hypertension Niece    Congestive Heart Failure Niece     Prior to Admission medications   Medication Sig Start Date End Date Taking? Authorizing Provider  allopurinol (ZYLOPRIM) 300 MG tablet Take 0.5 tablets (150 mg total) by mouth daily. 02/25/20   Trena Platt  K, MD  amLODipine-olmesartan (AZOR) 5-20 MG tablet Take 1 tablet by mouth daily. 10/05/21   [provider]  aspirin EC 81 MG tablet Take 1 tablet (81 mg total) by mouth daily. Swallow whole. 12/15/20 12/15/21  Shahmehdi, Gemma Payor, MD  atorvastatin (LIPITOR) 10 MG tablet Take 1 tablet (10 mg total) by mouth daily. 01/26/20   Anabel Halon, MD  atorvastatin (LIPITOR) 10 MG tablet Take 1 tablet by mouth every morning. 02/09/21   [provider]  Budeson-Glycopyrrol-Formoterol (BREZTRI AEROSPHERE) 160-9-4.8 MCG/ACT AERO Inhale 2  puffs into the lungs 2 (two) times daily. 09/07/21 12/06/21  Nyoka Cowden, MD  Cholecalciferol (DIALYVITE VITAMIN D 5000 PO) Take by mouth.    [provider]  Cholecalciferol 1.25 MG (50000 UT) TABS Take by mouth.    [provider]  DULoxetine (CYMBALTA) 60 MG capsule Take 1 capsule (60 mg total) by mouth daily. 12/16/20 01/15/21  Kendell Bane, MD  famotidine (PEPCID) 20 MG tablet One after supper 11/01/20   Nyoka Cowden, MD  gabapentin (NEURONTIN) 300 MG capsule Take 1 capsule (300 mg total) by mouth 3 (three) times daily for 7 days. 08/05/21 08/12/21  McDonald, Rachelle Hora, DPM  hydrochlorothiazide (HYDRODIURIL) 12.5 MG tablet Take 1 tablet by mouth daily. 09/14/21   [provider]  HYDROcodone-acetaminophen (NORCO/VICODIN) 5-325 MG tablet hydrocodone 5 mg-acetaminophen 325 mg tablet  Take 1 tablet twice a day by oral route as needed.    [provider]  ketoconazole (NIZORAL) 2 % cream Apply 1 application topically daily. 01/18/21   Louann Sjogren, DPM  ketoconazole (NIZORAL) 2 % cream Apply topically. 12/13/20   [provider]  PROAIR HFA 108 (90 BASE) MCG/ACT inhaler Inhale 2 puffs into the lungs every 6 (six) hours as needed for wheezing or shortness of breath.  02/15/14   [provider]  venlafaxine XR (EFFEXOR-XR) 37.5 MG 24 hr capsule Take 37.5 mg by mouth daily. 11/29/20   [provider]  zolpidem (AMBIEN) 10 MG tablet Take 1 tablet (10 mg total) by mouth at bedtime. 05/03/21 06/02/21  Coralyn Helling, MD    Physical Exam: Vitals:   10/07/21 1330 10/07/21 1400 10/07/21 1429 10/07/21 1500  BP: 124/82 (!) 192/103  (!) 145/93  Pulse: 91 95  74  Resp: 17 19  19   Temp:    97.7 F (36.5 C)  TempSrc:    Oral  SpO2: 94% 93% 93% 96%  Weight:      Height:       General exam: Alert, awake, oriented x 3; no chest pain, no nausea, no vomiting.  Uncontrolled blood pressure and mild orthopnea appreciated on exam. Respiratory  system: Positive tachypnea and decreased breath sounds at the bases appreciated on examination.  No using accessory muscles. Cardiovascular system:RRR. No rubs or gallops; positive JVD. Gastrointestinal system: Abdomen is nondistended, soft and nontender. No organomegaly or masses felt. Normal bowel sounds heard. Central nervous system: Alert and oriented. No focal neurological deficits. Extremities: No cyanosis, clubbing or edema. Skin: No petechiae. Psychiatry: Judgement and insight appear normal. Mood & affect appropriate.   Data Reviewed: TSH: 0.983 Phosphorus 3.3 Comprehensive metabolic panel: Sodium 136, potassium 6.2, chloride 106, bicarb 23, BUN 24, creatinine 1.00; AST 66 and otherwise a stable LFTs. Chest x-ray: Demonstrating vascular congestion and presumed interstitial edema.  No frank opacities or infiltrates. D-dimer: Less than 0.27 CBC: WBCs 4.2, hemoglobin 12.6 and platelet counts 211 K BNP: 1731   Assessment and Plan: * SOBOE (  shortness of breath on exertion) - With complaints of shortness of breath with activity for the last 7-10 days -Patient also expressed mild orthopnea on the day of admission. -Elevated BNP and chest x-ray 8 demonstrating vascular congestion with presumably interstitial edema. -Case discussed with cardiology service and 2D echo has been recommended -Follow heart healthy diet, daily weights and strict I's and O's -Patient has been started on IV diuretics, and will use hydralazine and imdur to achieved better BP control. -follow clinical response.  -holding b-blockers in the setting of COPD.  Hyperkalemia - Potassium 6.2 at time of admission -EKG and telemetry not demonstrating acute abnormalities -Treatment with Lasix, insulin, bicarbonate and Lokelma has been provided -Follow electrolytes trend.  Gastroesophageal reflux disease - Continue PPI.  COPD GOLD 1  - Currently no wheezing appreciated -No using accessory muscles and good  saturation on room air -Continue home maintenance therapy and as needed bronchodilator management.  Anxiety -Overall mood stable -No suicidal ideation or hallucination -Continue as needed anxiolytics and resume the use of Effexor and Cymbalta.  Chronic low back pain - Continue home analgesic regimen.  Gout - Flare appreciated -Continue the use of allopurinol.  Essential hypertension - Uncontrolled and elevated at time of admission -Will treat with amlodipine, Lasix and following cardiology recommendation the addition of hydralazine and Imdur -Follow vital signs. -Heart healthy diet discussed with patient.     Advance Care Planning:   Code Status: Full Code   Consults: Cardiology service  Family Communication: No family at bedside.  Severity of Illness: The appropriate patient status for this patient is OBSERVATION. Observation status is judged to be reasonable and necessary in order to provide the required intensity of service to ensure the patient's safety. The patient's presenting symptoms, physical exam findings, and initial radiographic and laboratory data in the context of their medical condition is felt to place them at decreased risk for further clinical deterioration. Furthermore, it is anticipated that the patient will be medically stable for discharge from the hospital within 2 midnights of admission.   Author: Vassie Loll, MD 10/07/2021 3:49 PM  For on call review [email or web address].

## 2021-10-07 NOTE — Assessment & Plan Note (Addendum)
-  With complaints of shortness of breath with activity for the last 7-10 days. -Patient also expressed mild orthopnea on the day of admission. -Reporting improvement in her breathing; just trace pedal edema appreciated bilaterally. -On exam no wheezing, no crackles, breathing comfortable and speaking in full sentences. -Most recent ReDS clip measurement 19 (10/08/2021). -Diuretics transition to oral route; using Lasix 40 mg daily as per cardiology recommendations.. -Continue to follow electrolytes and further replete as needed. -Advised to maintain adequate hydration, to follow a low-sodium diet and to check weight on daily basis.   -2-D echo demonstrating EF=15-20%  -Continue farxiga, low dose bisoprolol, imdur and adjusted dose of hydralazine -amlodipine discontinued. -cardiology planning for event monitoring and cath as an outpatient.

## 2021-10-07 NOTE — ED Triage Notes (Signed)
Pt presents with SOB on exertion x 2 weeks, PMD of COPD.

## 2021-10-07 NOTE — Progress Notes (Addendum)
Initial Nutrition Assessment  DOCUMENTATION CODES:   Not applicable  INTERVENTION:  Ensure Enlive po BID   Heart Healthy diet education -AVS  Avoid high potassium foods such as -potatoes, bananas, orange juice, broccoli  NUTRITION DIAGNOSIS:   Inadequate oral intake related to acute illness as evidenced by per patient/family report.   GOAL:  Patient will meet greater than or equal to 90% of their needs  MONITOR:  PO intake, Labs, Weight trends  REASON FOR ASSESSMENT:   Malnutrition Screening Tool    ASSESSMENT: Patient is a 81 yo female with hx of COPD, GERD, HTN, Gout, anemia, arthritis. Patient presents with shortness of breath.   Patient follows regular diet at home. No recent weight loss but patient doesn't "eat as much as she used to". Independent with feeding. No family present.   Patient reports possible discharge over the weekend.   Weight encounters: stable between-74-77 kg the past year. Currently 74.2 kg.   Medications: pepcid, lasix, Lipitor.      Latest Ref Rng & Units 10/07/2021   12:42 PM 08/02/2021    1:08 PM 02/27/2021    1:37 PM  BMP  Glucose 70 - 99 mg/dL 93  378  588   BUN 8 - 23 mg/dL 24  19  25    Creatinine 0.44 - 1.00 mg/dL  5.02  7.74   BUN/Creat Ratio 12 - 28  19    Sodium 135 - 145 mmol/L 136  140  137   Potassium 3.5 - 5.1 mmol/L 6.2  4.4  3.5   Chloride 98 - 111 mmol/L 106  102  107   CO2 22 - 32 mmol/L 23  22  18    Calcium 8.9 - 10.3 mg/dL 9.2  9.4  9.3       NUTRITION - FOCUSED PHYSICAL EXAM: Nutrition-Focused physical exam completed. Findings are no fat depletion, mild muscle depletion, and mild edema.     Diet Order:   Diet Order             Diet Heart Room service appropriate? Yes; Fluid consistency: Thin  Diet effective now                   EDUCATION NEEDS:  Education needs have been addressed  Skin:  Skin Assessment: Reviewed RN Assessment  Last BM:  8/4  Height:   Ht Readings from Last 1  Encounters:  10/07/21 5' 2.5" (1.588 m)    Weight:   Wt Readings from Last 1 Encounters:  10/07/21 74.2 kg    Ideal Body Weight:   52 kg  BMI:  Body mass index is 29.45 kg/m.  Estimated Nutritional Needs:   Kcal:  1600-1800  Protein:  70-75 gr  Fluid:  1600 ml daily  12/07/21 MS,RD,CSG,LDN Contact: 12/07/21

## 2021-10-07 NOTE — Consult Note (Signed)
Cardiology Consultation:  Patient ID: Heather [name] MRN: [id number]; DOB: 01/17/1941  Admit date: 10/07/2021 Date of Consult: 10/07/2021 Primary Care Provider: Pcp, No Primary Cardiologist: None  Primary Electrophysiologist:  None  Patient Profile:  Heather [name] is a 81 y.o. female with a hx of COPD, hypertension, stroke who is being seen today for the evaluation of hypoxic respiratory failure at the request of Vassie Loll, MD.  History of Present Illness:  Heather [name] presents with 1 day of progressively worsening shortness of breath.  She presents with her friend.  She is quite short of breath during my examination and not able to provide much history.  Apparently she became acutely short of breath.  She has not had any lower extremity edema.  She does have elevated JVD but does not appear grossly volume overloaded.  Initial blood pressure on arrival 192/103.  Oxygen saturations in the low 90s on room air.  Chest x-ray shows enlarged cardiac silhouette.  She has mild interstitial edema and a small left pleural effusion.  Labs notable for hyperkalemia with potassium 6.2.  BMP 1731.  Creatinine 1.0.  Hemoglobin 12.6.  Platelets are 211.  She is very uncomfortable and reports she can just not catch her breath.  Heart Pathway Score:       Past Medical History: Past Medical History:  Diagnosis Date   Anemia    Anxiety    Arthritis    COPD (chronic obstructive pulmonary disease) (HCC)    CVA (cerebral vascular accident) (HCC)    Depression    Dry eyes 04/14/2014   GERD (gastroesophageal reflux disease)    Glaucoma    Gout    HTN (hypertension)    OSA (obstructive sleep apnea)     Past Surgical History: Past Surgical History:  Procedure Laterality Date   ABDOMINAL HYSTERECTOMY     BACK SURGERY     lumbar-disc   BUNIONECTOMY Bilateral    FOOT SURGERY Left    repair of "kissing Cousins"   JOINT REPLACEMENT Bilateral    hip    JOINT REPLACEMENT Right 2010 or 2012   knee   KNEE  SURGERY     SHOULDER ARTHROSCOPY Right    shoulder surgery in [place] about 2000   SHOULDER HEMI-ARTHROPLASTY Left 03/25/2014   Procedure: LEFT SHOULDER HEMI-ARTHROPLASTY;  Surgeon: Vickki Hearing, MD;  Location: [facility];  Service: Orthopedics;  Laterality: Left;     Home Medications:  Prior to Admission medications   Medication Sig Start Date End Date Taking? Authorizing Provider  allopurinol (ZYLOPRIM) 300 MG tablet Take 0.5 tablets (150 mg total) by mouth daily. 02/25/20   Anabel Halon, MD  amLODipine-olmesartan (AZOR) 5-20 MG tablet Take 1 tablet by mouth daily. 10/05/21   [provider]  aspirin EC 81 MG tablet Take 1 tablet (81 mg total) by mouth daily. Swallow whole. 12/15/20 12/15/21  Shahmehdi, Gemma Payor, MD  atorvastatin (LIPITOR) 10 MG tablet Take 1 tablet (10 mg total) by mouth daily. 01/26/20   Anabel Halon, MD  atorvastatin (LIPITOR) 10 MG tablet Take 1 tablet by mouth every morning. 02/09/21   [provider]  Budeson-Glycopyrrol-Formoterol (BREZTRI AEROSPHERE) 160-9-4.8 MCG/ACT AERO Inhale 2 puffs into the lungs 2 (two) times daily. 09/07/21 12/06/21  Nyoka Cowden, MD  cephALEXin (KEFLEX) 500 MG capsule Take 1 capsule (500 mg total) by mouth 3 (three) times daily. 08/11/21   McDonald, Rachelle Hora, DPM  Cholecalciferol (DIALYVITE VITAMIN D 5000 PO) Take by mouth.  [provider]  Cholecalciferol 1.25 MG (50000 UT) TABS Take by mouth.    [provider]  DULoxetine (CYMBALTA) 60 MG capsule Take 1 capsule (60 mg total) by mouth daily. 12/16/20 01/15/21  Kendell Bane, MD  famotidine (PEPCID) 20 MG tablet One after supper 11/01/20   Nyoka Cowden, MD  famotidine (PEPCID) 20 MG tablet Take by mouth. 11/01/20   [provider]  gabapentin (NEURONTIN) 300 MG capsule Take 1 capsule (300 mg total) by mouth 3 (three) times daily for 7 days. 08/05/21 08/12/21  McDonald, Rachelle Hora, DPM  hydrochlorothiazide (HYDRODIURIL) 12.5 MG tablet Take 1  tablet by mouth daily. 09/14/21   [provider]  HYDROcodone-acetaminophen (NORCO/VICODIN) 5-325 MG tablet hydrocodone 5 mg-acetaminophen 325 mg tablet  Take 1 tablet twice a day by oral route as needed.    [provider]  ketoconazole (NIZORAL) 2 % cream Apply 1 application topically daily. 01/18/21   Louann Sjogren, DPM  ketoconazole (NIZORAL) 2 % cream Apply topically. 12/13/20   [provider]  PROAIR HFA 108 (90 BASE) MCG/ACT inhaler Inhale 2 puffs into the lungs every 6 (six) hours as needed for wheezing or shortness of breath.  02/15/14   [provider]  venlafaxine XR (EFFEXOR-XR) 37.5 MG 24 hr capsule Take 37.5 mg by mouth daily. 11/29/20   [provider]  zolpidem (AMBIEN) 10 MG tablet Take 1 tablet (10 mg total) by mouth at bedtime. 05/03/21 06/02/21  Coralyn Helling, MD    Inpatient Medications: Scheduled Meds:  Continuous Infusions:  PRN Meds:   Allergies:    Allergies  Allergen Reactions   Lovastatin Swelling    Patient states that her tongue swells and she gets a tingling feeling all over. Patient states that her tongue swells and she gets a tingling feeling all over.   Lisinopril     Social History:   Social History   Socioeconomic History   Marital status: Divorced    Spouse name: Not on file   Number of children: Not on file   Years of education: Not on file   Highest education level: Not on file  Occupational History   Not on file  Tobacco Use   Smoking status: Former    Packs/day: 1.00    Years: 40.00    Total pack years: 40.00    Types: Cigarettes    Quit date: 03/20/1985    Years since quitting: 36.5   Smokeless tobacco: Never  Substance and Sexual Activity   Alcohol use: No   Drug use: No   Sexual activity: Yes    Birth control/protection: Surgical  Other Topics Concern   Not on file  Social History Narrative   Heather [name] is a 81 year old divorced female who lives alone. She has support of her son  and sister.    Social Determinants of Health   Financial Resource Strain: Low Risk  (06/30/2020)   Overall Financial Resource Strain (CARDIA)    Difficulty of Paying Living Expenses: Not hard at all  Food Insecurity: No Food Insecurity (06/30/2020)   Hunger Vital Sign    Worried About Running Out of Food in the Last Year: Never true    Ran Out of Food in the Last Year: Never true  Transportation Needs: No Transportation Needs (06/30/2020)   PRAPARE - Administrator, Civil Service (Medical): No    Lack of Transportation (Non-Medical): No  Physical Activity: Insufficiently Active (06/30/2020)   Exercise Vital Sign  Days of Exercise per Week: 7 days    Minutes of Exercise per Session: 20 min  Stress: No Stress Concern Present (06/30/2020)   [employer] of Occupational Health - Occupational Stress Questionnaire    Feeling of Stress : Not at all  Social Connections: Moderately Isolated (06/30/2020)   Social Connection and Isolation Panel [NHANES]    Frequency of Communication with Friends and Family: More than three times a week    Frequency of Social Gatherings with Friends and Family: Once a week    Attends Religious Services: More than 4 times per year    Active Member of [employer] or Organizations: No    Attends Banker Meetings: Never    Marital Status: Widowed  Intimate Partner Violence: Not At Risk (06/30/2020)   Humiliation, Afraid, Rape, and Kick questionnaire    Fear of Current or Ex-Partner: No    Emotionally Abused: No    Physically Abused: No    Sexually Abused: No     Family History:    Family History  Problem Relation Age of Onset   High blood pressure Mother    Aneurysm Father    Diabetes Brother    Pancreatitis Brother    Hypertension Niece    Congestive Heart Failure Niece      ROS:  All other ROS reviewed and negative. Pertinent positives noted in the HPI.     Physical Exam/Data:   Vitals:   10/07/21 1300 10/07/21 1330  10/07/21 1400 10/07/21 1429  BP: 128/81 124/82 (!) 192/103   Pulse: 91 91 95   Resp: 15 17 19    Temp:      TempSrc:      SpO2: 94% 94% 93% 93%  Weight:      Height:       No intake or output data in the 24 hours ending 10/07/21 1451     10/07/2021   12:06 PM 09/10/2021    6:54 PM 08/02/2021   11:16 AM  Last 3 Weights  Weight (lbs) 163 lb 9.6 oz 165 lb 165 lb 3.2 oz  Weight (kg) 74.208 kg 74.844 kg 74.934 kg    Body mass index is 29.45 kg/m.  General: Well nourished, well developed, in no acute distress Head: Atraumatic, normal size  Eyes: PEERLA, EOMI  Neck: Supple, JVD 12 to 15 cm of water Endocrine: No thryomegaly Cardiac: Normal S1, S2; RRR; no murmurs, rubs, or gallops Lungs: Poor airway movement, very diminished breath sounds Abd: Soft, nontender, no hepatomegaly  Ext: No edema, pulses 2+ Musculoskeletal: No deformities, BUE and BLE strength normal and equal Skin: Warm and dry, no rashes   Neuro: Alert and oriented to person, place, time, and situation, CNII-XII grossly intact, no focal deficits  Psych: Normal mood and affect   EKG:  The EKG was personally reviewed and demonstrates: Sinus tachycardia heart rate 101, left bundle branch block Telemetry:  Telemetry was personally reviewed and demonstrates: Sinus rhythm in the 90s  Relevant CV Studies: TTE 12/14/2020  1. Left ventricular ejection fraction, by estimation, is 50 to 55%. The  left ventricle has low normal function. The left ventricle demonstrates  regional wall motion abnormalities (see scoring diagram/findings for  description). Left ventricular diastolic   parameters are consistent with Grade I diastolic dysfunction (impaired  relaxation).   2. Right ventricular systolic function is normal. The right ventricular  size is normal.   3. Left atrial size was mildly dilated.   4. The mitral valve is normal in  structure. No evidence of mitral valve  regurgitation. No evidence of mitral stenosis.   5. The aortic  valve is tricuspid. Aortic valve regurgitation is not  visualized. Mild aortic valve sclerosis is present, with no evidence of  aortic valve stenosis.   6. The inferior vena cava is normal in size with greater than 50%  respiratory variability, suggesting right atrial pressure of 3 mmHg.   Laboratory Data: High Sensitivity Troponin:  No results for input(s): "TROPONINIHS" in the last 720 hours.   Cardiac EnzymesNo results for input(s): "TROPONINI" in the last 168 hours. No results for input(s): "TROPIPOC" in the last 168 hours.  Chemistry Recent Labs  Lab 10/07/21 1242  NA 136  K 6.2*  CL 106  CO2 23  GLUCOSE 93  BUN 24*  CREATININE 1.00  CALCIUM 9.2  GFRNONAA 57*  ANIONGAP 7    Recent Labs  Lab 10/07/21 1242  PROT 7.3  ALBUMIN 4.3  AST 66*  ALT 39  ALKPHOS 76  BILITOT 1.7*   Hematology Recent Labs  Lab 10/07/21 1242  WBC 4.2  RBC 4.18  HGB 12.6  HCT 39.2  MCV 93.8  MCH 30.1  MCHC 32.1  RDW 14.8  PLT 211   BNP Recent Labs  Lab 10/07/21 1242  BNP 1,731.0*    DDimer  Recent Labs  Lab 10/07/21 1242  DDIMER <0.27    Radiology/Studies:  DG Chest Portable 1 View  Result Date: 10/07/2021 CLINICAL DATA:  Shortness of breath with exertion x2 weeks. EXAM: PORTABLE CHEST 1 VIEW COMPARISON:  Chest radiograph Aug 02, 2021 FINDINGS: Enlarged cardiac silhouette with central vascular prominence. Tortuous atherosclerotic thoracic aorta. Reduced lung volumes. Mild interstitial opacities. Small left pleural effusion. The visualized skeletal structures are unremarkable. IMPRESSION: Enlarged cardiac silhouette with central vascular prominence and probable mild interstitial edema with a small left pleural effusion. Aortic Atherosclerosis (ICD10-I70.0). Electronically Signed   By: Maudry Mayhew M.D.   On: 10/07/2021 12:45    Assessment and Plan:   #Acute hypoxic respiratory failure #Mild interstitial edema #COPD exacerbation #Concerns for congestive heart  failure #Hypertensive crisis -She presents with acute shortness of breath.  EKG demonstrates sinus tachycardia.  BNP is elevated. -Exam is notable for elevated JVD but she really has no crackles on examination.  Her examination is more consistent with COPD exacerbation. -Chest x-ray does have interstitial markings which are increased but this could just be related to lung disease. -Her BNP is quite elevated so I suspect she will have some element of LV or RV dysfunction.  Echo is pending.  Of note echo was largely unremarkable in October 2022. -She has no lower extremity edema or abdominal distention.  Her elevated JVD could simply be due to COPD exacerbation. -We will also keep pulmonary embolism on the differential.  Her acute shortness of breath is concerning.  Her chest x-ray really is not that impressive for pulmonary edema to explain her respiratory failure.   -We may need to consider CT PE study. -In the interim do agree with diuresis.  40 mg IV twice daily.  Hold beta-blocker in the setting of active lung disease.  We will add Imdur 30 mg daily and hydralazine 25 mg 3 times daily assuming her LV function will be reduced.  She will need blood pressure control regardless. -Would avoid ACE/ARB/Arni in the setting of hyperkalemia.  Clearly this needs to be corrected.  Hospital medicine has already ordered corrective measures. -She may end up needing BiPAP based on her examination.  Close monitoring. -We will obtain her echo.  Continue with diuresis.  Cardiology will follow-up.  Again if no improvement would consider CT PE study.  #Hyperkalemia -Work-up per hospital medicine. -Hold home olmesartan.  #Hypertension -Imdur hydralazine ordered as above.  For questions or updates, please contact [facility] Please consult [email or web address] for contact info under   Signed, Gerri Spore T. Flora Lipps, MD, [facility]  [facility]  10/07/2021 2:51 PM

## 2021-10-07 NOTE — ED Notes (Signed)
RT called for neb

## 2021-10-07 NOTE — Assessment & Plan Note (Signed)
-  Currently no wheezing appreciated -No using accessory muscles and with good saturation -Continue home maintenance therapy and as needed bronchodilator management.

## 2021-10-07 NOTE — Assessment & Plan Note (Signed)
-  no acute Flare appreciated -Continue the use of allopurinol.

## 2021-10-07 NOTE — Assessment & Plan Note (Addendum)
-  Continue home analgesic regimen. -Stable overall.

## 2021-10-08 ENCOUNTER — Encounter (HOSPITAL_COMMUNITY): Payer: Self-pay

## 2021-10-08 DIAGNOSIS — R0602 Shortness of breath: Secondary | ICD-10-CM | POA: Diagnosis not present

## 2021-10-08 DIAGNOSIS — F419 Anxiety disorder, unspecified: Secondary | ICD-10-CM | POA: Diagnosis not present

## 2021-10-08 DIAGNOSIS — J449 Chronic obstructive pulmonary disease, unspecified: Secondary | ICD-10-CM | POA: Diagnosis not present

## 2021-10-08 DIAGNOSIS — M545 Low back pain, unspecified: Secondary | ICD-10-CM | POA: Diagnosis not present

## 2021-10-08 MED ORDER — FUROSEMIDE 20 MG PO TABS: 30.0000 mg | ORAL_TABLET | Freq: Two times a day (BID) | ORAL | Status: DC

## 2021-10-08 MED ORDER — ZOLPIDEM TARTRATE 5 MG PO TABS
5.0000 mg | ORAL_TABLET | Freq: Every evening | ORAL | Status: DC | PRN
  Administered 2021-10-09: 5 mg via ORAL
  Filled 2021-10-08: qty 1

## 2021-10-08 MED ORDER — ORAL CARE MOUTH RINSE
15.0000 mL | OROMUCOSAL | Status: DC | PRN
Start: 2021-10-08 — End: 2021-10-10

## 2021-10-08 MED ORDER — DAPAGLIFLOZIN PROPANEDIOL 10 MG PO TABS
10.0000 mg | ORAL_TABLET | Freq: Every day | ORAL | Status: DC
  Administered 2021-10-08 – 2021-10-10 (×3): 10 mg via ORAL
  Filled 2021-10-08 (×3): qty 1

## 2021-10-08 MED ORDER — FUROSEMIDE 20 MG PO TABS
30.0000 mg | ORAL_TABLET | Freq: Every day | ORAL | Status: DC
  Administered 2021-10-09: 30 mg via ORAL
  Filled 2021-10-08: qty 2

## 2021-10-08 MED ORDER — BISOPROLOL FUMARATE 5 MG PO TABS
2.5000 mg | ORAL_TABLET | Freq: Every day | ORAL | Status: DC
Start: 2021-10-08 — End: 2021-10-10
  Administered 2021-10-08 – 2021-10-10 (×3): 2.5 mg via ORAL
  Filled 2021-10-08 (×3): qty 1

## 2021-10-08 NOTE — Progress Notes (Signed)
   10/08/21 1255  ReDS Vest / Clip  Station Marker A  Ruler Value 34  ReDS Value Range < 36  ReDS Actual Value 19

## 2021-10-08 NOTE — Care Management Obs Status (Signed)
MEDICARE OBSERVATION STATUS NOTIFICATION   Patient Details  Name: Heather [name] MRN: [id number] Date of Birth: Oct 28, 1940   Medicare Observation Status Notification Given:  Yes    Karn Cassis, LCSW 10/08/2021, 9:07 AM

## 2021-10-08 NOTE — Assessment & Plan Note (Addendum)
-  Appears to be stable and at baseline currently. -Increase noticed encounter creatinine level after diuresis; but per GFR remains at baseline. -Close monitoring of renal function electrolytes as an outpatient advised.

## 2021-10-08 NOTE — Progress Notes (Signed)
Progress Note   Patient: Heather [name] DOB: 15-Feb-1941 DOA: 10/07/2021     0 DOS: the patient was seen and examined on 10/08/2021   Brief hospital patient course: Heather [name] is a 81 y.o. female with medical history significant of hypertension, anxiety/depression, gout, history of COPD, prior history of stroke and chronic lower back pain; presented to the hospital secondary to shortness of breath especially on exertion for the last 7-10 days prior to admission.  Patient reports symptoms started gradually but progressing.  No associated chest pain or palpitations has been reported.  On the day prior to admission she expressed feeling some orthopnea.  Had not been any sick contacts and she had been compliant with her medications.  Despite multiple uses of her home bronchodilators inhalers she continued to feel short of breath and came to the hospital for further evaluation and management. Patient reports no focal weakness, no dysuria, no hematuria, no fever, no chills, no abdominal pain, no melena, no hematochezia or any other complaints.   Work-up demonstrating elevated BNP, chest x-ray with vascular congestion and presumed interstitial edema.  Lasix has been provided and cardiology consulted. TR[facility] contacted to place in the hospital for further relation and management.  Assessment and Plan: * SOBOE (shortness of breath on exertion) -With complaints of shortness of breath with activity for the last 7-10 days -Patient also expressed mild orthopnea on the day of admission. -Reporting improvement in her breathing; just trace edema appreciated bilaterally. -On exam decreased breath sounds without frank crackles. -ReDS clip measurement 19. -will change lasix to daily for 24 more hours and continue following renal function and electrolytes -2-D echo demonstrating EF=15-20%  -started on farxiga, low dose bisoprolol, imdur and hydralazine -amlodipine discontinued. -cardiology on board and  will continue following  Rec's.    Hyperkalemia -Potassium 6.2 at time of admission -No acute abnormalities appreciated on EKG or telemetry; patient received treatment with Lasix, insulin, bicarbonate and Lokelma -Repeat potassium 4.0 -Continue to follow trend.  Stage 3a chronic kidney disease (HCC) -Appears to be stable and at baseline -Continue to closely follow renal function trend electrolytes -Patient advised to maintain adequate oral hydration.  Gastroesophageal reflux disease -Continue PPI.  COPD GOLD 1  -Currently no wheezing appreciated -No using accessory muscles and with good saturation -Continue home maintenance therapy and as needed bronchodilator management.  Anxiety -Overall mood stable -No suicidal ideation or hallucinations appreciated. -Continue as needed anxiolytics and continue the use of Effexor and Cymbalta.  Chronic low back pain -Continue home analgesic regimen.  Gout -no acute Flare appreciated -Continue the use of allopurinol.  Essential hypertension -Uncontrolled and elevated at time of admission -currently stable with current regimen -continue to follow VS -Heart healthy/low-sodium diet discussed with patient.    Subjective:  Chest pain, no palpitations, no nausea vomiting.  Reports breathing has improved and she is feeling better.  Still mildly orthopneic when trying to lay down flat.   Physical Exam: Vitals:   10/08/21 0500 10/08/21 0802 10/08/21 0856 10/08/21 1617  BP:  112/85  (!) 99/56  Pulse:    86  Resp:    20  Temp:    98 F (36.7 C)  TempSrc:      SpO2:   100% 99%  Weight: 72.9 kg     Height:       General exam: Alert, awake, oriented x 3; which brings in has improved; still having nausea.  No chest pain, no nausea, no vomiting. Respiratory system: Decreased  breath sounds at the bases; no using accessory muscles. No wheezing appreciated. Cardiovascular system:RRR. No rubs or gallops; no JVD appreciated on  exam. Gastrointestinal system: Abdomen is nondistended, soft and nontender. No organomegaly or masses felt. Normal bowel sounds heard. Central nervous system: Alert and oriented. No focal neurological deficits. Extremities: No cyanosis or clubbing; positive trace edema bilaterally. Skin: No petechiae. Psychiatry: Judgement and insight appear normal. Mood & affect appropriate.   Data Reviewed: 2-D echo: Demonstrating diffuse hypokinesis, ejection fraction 15 to 20%; no significant valvular abnormality. -Basic metabolic panel: Sodium 140, potassium 4.7, chloride 105, bicarb 26, BUN 29 and creatinine 1.0 ReDS clipp measurement 19  Family Communication: Sons at bedside.   Disposition: Status is: Observation The patient will require care spanning > 2 midnights and should be moved to inpatient because: Presenting with acute heart failure; echo demonstrating acute systolic failure in need of IV diuresis and further work-up by cardiology service.   Planned Discharge Destination: Home   Author: Vassie Loll, MD 10/08/2021 4:59 PM  For on call review [email or web address].

## 2021-10-09 DIAGNOSIS — I5021 Acute systolic (congestive) heart failure: Secondary | ICD-10-CM | POA: Diagnosis present

## 2021-10-09 DIAGNOSIS — J449 Chronic obstructive pulmonary disease, unspecified: Secondary | ICD-10-CM | POA: Diagnosis not present

## 2021-10-09 DIAGNOSIS — M545 Low back pain, unspecified: Secondary | ICD-10-CM | POA: Diagnosis not present

## 2021-10-09 DIAGNOSIS — R0602 Shortness of breath: Secondary | ICD-10-CM | POA: Diagnosis not present

## 2021-10-09 MED ORDER — POTASSIUM CHLORIDE CRYS ER 20 MEQ PO TBCR
40.0000 meq | EXTENDED_RELEASE_TABLET | Freq: Once | ORAL | Status: AC
  Administered 2021-10-09: 40 meq via ORAL
  Filled 2021-10-09: qty 2

## 2021-10-09 NOTE — Progress Notes (Signed)
Progress Note   Patient: Heather [name] IEP:[id number] DOB: [birth year] DOA: 10/07/2021     1 DOS: the patient was seen and examined on 10/09/2021   Brief hospital patient course: Heather [name] is a 81 y.o. female with medical history significant of hypertension, anxiety/depression, gout, history of COPD, prior history of stroke and chronic lower back pain; presented to the hospital secondary to shortness of breath especially on exertion for the last 7-10 days prior to admission.  Patient reports symptoms started gradually but progressing.  No associated chest pain or palpitations has been reported.  On the day prior to admission she expressed feeling some orthopnea.  Had not been any sick contacts and she had been compliant with her medications.  Despite multiple uses of her home bronchodilators inhalers she continued to feel short of breath and came to the hospital for further evaluation and management. Patient reports no focal weakness, no dysuria, no hematuria, no fever, no chills, no abdominal pain, no melena, no hematochezia or any other complaints.   Work-up demonstrating elevated BNP, chest x-ray with vascular congestion and presumed interstitial edema.  Lasix has been provided and cardiology consulted. TRH contacted to place in the hospital for further relation and management.  Assessment and Plan: * SOBOE (shortness of breath on exertion) -With complaints of shortness of breath with activity for the last 7-10 days -Patient also expressed mild orthopnea on the day of admission. -Reporting improvement in her breathing; just trace edema appreciated bilaterally. -On exam decreased breath sounds without frank crackles. -Most recent ReDS clip measurement 19 (10/08/2021). -Diuretics transition to oral route; using Lasix 30 mg daily. -Continue to follow electrolytes and replete as needed.  Following renal function and electrolytes -2-D echo demonstrating EF=15-20%  -Continue farxiga, low dose  bisoprolol, imdur and hydralazine -amlodipine discontinued. -cardiology on board and will continue following  Rec's; possible cardiac cath during this hospitalization.    Hyperkalemia -Potassium 6.2 at time of admission -No acute abnormalities appreciated on EKG or telemetry; patient received treatment with Lasix, insulin, bicarbonate and Lokelma -Repeat potassium 3.3 -Most likely from diuresis. -will Replete and follow trend.  Stage 3a chronic kidney disease (HCC) -Appears to be stable and at baseline -Continue to closely follow renal function trend electrolytes -Patient advised to maintain adequate oral hydration.  Gastroesophageal reflux disease -Continue PPI.  COPD GOLD 1  -Currently no wheezing appreciated -No using accessory muscles and with good saturation -Continue home maintenance therapy and as needed bronchodilator management.  Anxiety -Overall mood stable -No suicidal ideation or hallucinations appreciated. -Continue as needed anxiolytics and continue the use of Effexor and Cymbalta.  Chronic low back pain -Continue home analgesic regimen.  Gout -no acute Flare appreciated -Continue the use of allopurinol.  Essential hypertension -Uncontrolled and elevated at time of admission -currently stable with current regimen -continue to follow VS -Heart healthy/low-sodium diet discussed with patient.    Subjective:  No chest pain, no palpitations, no nausea or vomiting.  Reports significant improvement in her breathing and currently just expressing mild shortness winded sensation with activity.  Patient denies orthopnea.  1-2 L nasal cannula in place with saturations in the 98 to 100%.  Physical Exam: Vitals:   10/09/21 0354 10/09/21 0533 10/09/21 0730 10/09/21 1358  BP: 107/73   (!) 88/59  Pulse: 76   81  Resp: 14   16  Temp: 97.6 F (36.4 C)   97.8 F (36.6 C)  TempSrc: Oral   Oral  SpO2: 100%  99% 100%  Weight:  73.2 kg    Height:       General  exam: Alert, awake, oriented x 3; no chest pain, no nausea, no vomiting, no palpitations respiratory and expressed significant improvement in her breathing. Respiratory system: 1-2 L nasal cannula in place with saturation in the 98 to 100%.  No frank crackles appreciated on exam; decreased breath sounds at the bases.  No using accessory muscles. Cardiovascular system:RRR.  No rubs, no gallops, no JVD appreciated on exam. Gastrointestinal system: Abdomen is nondistended, soft and nontender. No organomegaly or masses felt. Normal bowel sounds heard. Central nervous system: Alert and oriented. No focal neurological deficits. Extremities: No cyanosis or clubbing; trace pedal edema Appreciated. Skin: No petechiae. Psychiatry: Judgement and insight appear normal. Mood & affect appropriate.   Data Reviewed: 2-D echo: Demonstrating diffuse hypokinesis, ejection fraction 15 to 20%; no significant valvular abnormality. -Basic metabolic panel: Sodium 142, potassium 3.3, chloride 106, CO2 29, BUN 38, creatinine 1.19. ReDS clipp measurement 19; on 10/08/21  Family Communication: Sons at bedside.   Disposition: Status is: Observation The patient will require care spanning > 2 midnights and should be moved to inpatient because: Presenting with acute heart failure; echo demonstrating acute systolic failure in need of IV diuresis and further work-up by cardiology service.   Planned Discharge Destination: Home   Author: Vassie Loll, MD 10/09/2021 5:25 PM  For on call review [email or web address].

## 2021-10-09 NOTE — Care Management CC44 (Signed)
Condition Code 44 Documentation Completed  Patient Details  Name: Heather [name] MRN: [id number] Date of Birth: 09-25-1940   Condition Code 44 given:  Yes Patient signature on Condition Code 44 notice:  Yes Documentation of 2 MD's agreement:  Yes Code 44 added to claim:  Yes    Villa Herb, LCSWA 10/09/2021, 10:20 AM

## 2021-10-09 NOTE — Care Management Obs Status (Signed)
MEDICARE OBSERVATION STATUS NOTIFICATION   Patient Details  Name: Heather [name] MRN: [id number] Date of Birth: December 06, 1940   Medicare Observation Status Notification Given:  Yes    Villa Herb, LCSWA 10/09/2021, 10:20 AM

## 2021-10-09 NOTE — TOC Initial Note (Signed)
Transition of Care [facility]) - Initial/Assessment Note    Patient Details  Name: Heather [name] MRN: [id number] Date of Birth: 01/10/1941  [facility]) CM/SW Contact:    Villa Herb, LCSWA Phone Number: 10/09/2021, 10:22 AM  Clinical Narrative:                 [facility] consulted for CHF screen. CSW noted that this is a new diagnosis for pt. CSW spoke with pt about importance of daily weights. CSW explained following a heart healthy diet. Pt states that she does take all medications as prescribed. CSW notes that pt does not wear home O2. CSW explained to pt that if she needs to go home with O2 we will be able to set that up. Pt does not have a preference in company. TOC to follow.   Expected Discharge Plan: Home/Self Care Barriers to Discharge: Continued Medical Work up   Patient Goals and CMS Choice Patient states their goals for this hospitalization and ongoing recovery are:: return home CMS Medicare.gov Compare Post Acute Care list provided to:: Patient Choice offered to / list presented to : Patient  Expected Discharge Plan and Services Expected Discharge Plan: Home/Self Care In-house Referral: Clinical Social Work Discharge Planning Services: CM Consult   Living arrangements for the past 2 months: Single Family Home                                      Prior Living Arrangements/Services Living arrangements for the past 2 months: Single Family Home Lives with:: Self Patient language and need for interpreter reviewed:: Yes Do you feel safe going back to the place where you live?: Yes      Need for Family Participation in Patient Care: Yes (Comment) Care giver support system in place?: Yes (comment)   Criminal Activity/Legal Involvement Pertinent to Current Situation/Hospitalization: No - Comment as needed  Activities of Daily Living Home Assistive Devices/Equipment: Other (Comment) (boot right foot) ADL Screening (condition at time of admission) Patient's  cognitive ability adequate to safely complete daily activities?: Yes Is the patient deaf or have difficulty hearing?: No Does the patient have difficulty seeing, even when wearing glasses/contacts?: No Does the patient have difficulty concentrating, remembering, or making decisions?: No Patient able to express need for assistance with ADLs?: Yes Does the patient have difficulty dressing or bathing?: No Independently performs ADLs?: Yes (appropriate for developmental age) Does the patient have difficulty walking or climbing stairs?: Yes Weakness of Legs: None Weakness of Arms/Hands: None  Permission Sought/Granted                  Emotional Assessment Appearance:: Appears stated age Attitude/Demeanor/Rapport: Engaged Affect (typically observed): Accepting Orientation: : Oriented to Self, Oriented to Place, Oriented to  Time, Oriented to Situation Alcohol / Substance Use: Not Applicable Psych Involvement: No (comment)  Admission diagnosis:  Hyperkalemia [E87.5] SOBOE (shortness of breath on exertion) [R06.02] Acute systolic CHF (congestive heart failure) (HCC) [I50.21] Patient Active Problem List   Diagnosis Date Noted   Acute systolic CHF (congestive heart failure) (HCC) 10/09/2021   SOBOE (shortness of breath on exertion) 10/07/2021   Hyperkalemia 10/07/2021   Leg swelling 01/31/2021   Acute kidney injury (HCC) 12/14/2020   Tremor 06/21/2020   DOE (dyspnea on exertion) 05/25/2020   Stage 3a chronic kidney disease (HCC) 04/09/2020   Leg edema 02/03/2020   AKI (acute kidney injury) (HCC)  01/16/2020   Prediabetes 01/16/2020   Encounter to establish care 01/13/2020   Hyperlipidemia 01/13/2020   Anxiety 01/13/2020   COPD GOLD 1  01/13/2020   Gastroesophageal reflux disease 01/13/2020   Intertrigo 01/13/2020   Long-term current use of opiate analgesic 07/07/2019   Sacroiliac joint pain 07/07/2019   Bilateral carpal tunnel syndrome 05/10/2019   Chronic low back pain  05/10/2019   Lumbosacral radiculopathy 05/10/2019   Polyarthropathy 05/10/2019   Gout 10/24/2018   Essential hypertension 04/17/2014   Renal insufficiency 04/01/2014   Rotator cuff arthropathy    PCP:  Pcp, No Pharmacy:   [facility] - [place], [place] - [address] [place] [address] Phone: [phone] Fax: [phone]  [facility] - [place], [place] - [address] [place] [address] Phone: [phone] Fax: [phone]     Social Determinants of Health (SDOH) Interventions    Readmission Risk Interventions     No data to display

## 2021-10-10 ENCOUNTER — Observation Stay (INDEPENDENT_AMBULATORY_CARE_PROVIDER_SITE_OTHER): Payer: Medicare HMO

## 2021-10-10 ENCOUNTER — Telehealth: Payer: Self-pay

## 2021-10-10 DIAGNOSIS — I5021 Acute systolic (congestive) heart failure: Secondary | ICD-10-CM

## 2021-10-10 DIAGNOSIS — J449 Chronic obstructive pulmonary disease, unspecified: Secondary | ICD-10-CM

## 2021-10-10 DIAGNOSIS — I10 Essential (primary) hypertension: Secondary | ICD-10-CM

## 2021-10-10 DIAGNOSIS — F419 Anxiety disorder, unspecified: Secondary | ICD-10-CM | POA: Diagnosis not present

## 2021-10-10 DIAGNOSIS — I493 Ventricular premature depolarization: Secondary | ICD-10-CM

## 2021-10-10 DIAGNOSIS — M545 Low back pain, unspecified: Secondary | ICD-10-CM | POA: Diagnosis not present

## 2021-10-10 DIAGNOSIS — R0602 Shortness of breath: Secondary | ICD-10-CM | POA: Diagnosis not present

## 2021-10-10 MED ORDER — FUROSEMIDE 40 MG PO TABS: 40.0000 mg | ORAL_TABLET | Freq: Every day | ORAL | Status: DC

## 2021-10-10 MED ORDER — DAPAGLIFLOZIN PROPANEDIOL 10 MG PO TABS: 10.0000 mg | ORAL_TABLET | Freq: Every day | ORAL | 2 refills | Status: DC

## 2021-10-10 MED ORDER — LIVING BETTER WITH HEART FAILURE BOOK: Freq: Once | Status: AC

## 2021-10-10 MED ORDER — ISOSORBIDE MONONITRATE ER 30 MG PO TB24: 30.0000 mg | ORAL_TABLET | Freq: Every day | ORAL | 2 refills | Status: DC

## 2021-10-10 MED ORDER — HYDRALAZINE HCL 25 MG PO TABS: 25.0000 mg | ORAL_TABLET | Freq: Two times a day (BID) | ORAL | 2 refills | Status: DC

## 2021-10-10 MED ORDER — BISOPROLOL FUMARATE 5 MG PO TABS: 2.5000 mg | ORAL_TABLET | Freq: Every day | ORAL | 1 refills | Status: DC

## 2021-10-10 MED ORDER — HYDRALAZINE HCL 25 MG PO TABS
25.0000 mg | ORAL_TABLET | Freq: Two times a day (BID) | ORAL | Status: DC
Start: 2021-10-10 — End: 2021-10-10

## 2021-10-10 MED ORDER — FUROSEMIDE 40 MG PO TABS: 40.0000 mg | ORAL_TABLET | Freq: Every day | ORAL | 2 refills | Status: DC

## 2021-10-10 MED ORDER — PROAIR HFA 108 (90 BASE) MCG/ACT IN AERS: 2.0000 | INHALATION_SPRAY | Freq: Four times a day (QID) | RESPIRATORY_TRACT | 2 refills | Status: DC | PRN

## 2021-10-10 MED ORDER — FAMOTIDINE 20 MG PO TABS: 20.0000 mg | ORAL_TABLET | Freq: Every day | ORAL | 3 refills | Status: DC

## 2021-10-10 NOTE — Discharge Summary (Signed)
Physician Discharge Summary   Patient: Heather [name] MRN: [id number] DOB: 11/26/1940  Admit date:     10/07/2021  Discharge date: 10/10/21  Discharge Physician: Vassie Loll   PCP: Pcp, No   Recommendations at discharge:  Repeat basic metabolic panel to follow electrolytes and renal function Make sure patient has follow-up with cardiology service as instructed Reassess blood pressure and adjust antihypertensive treatment as needed   Discharge Diagnoses: Principal Problem:   SOBOE (shortness of breath on exertion) Active Problems:   Essential hypertension   Gout   Chronic low back pain   Anxiety   COPD GOLD 1    Gastroesophageal reflux disease   Stage 3a chronic kidney disease (HCC)   Hyperkalemia   Acute systolic CHF (congestive heart failure) (HCC)  Brief hospital patient course: Heather [name] is a 81 y.o. female with medical history significant of hypertension, anxiety/depression, gout, history of COPD, prior history of stroke and chronic lower back pain; presented to the hospital secondary to shortness of breath especially on exertion for the last 7-10 days prior to admission.  Patient reports symptoms started gradually but progressing.  No associated chest pain or palpitations has been reported.  On the day prior to admission she expressed feeling some orthopnea.  Had not been any sick contacts and she had been compliant with her medications.  Despite multiple uses of her home bronchodilators inhalers she continued to feel short of breath and came to the hospital for further evaluation and management. Patient reports no focal weakness, no dysuria, no hematuria, no fever, no chills, no abdominal pain, no melena, no hematochezia or any other complaints.   Work-up demonstrating elevated BNP, chest x-ray with vascular congestion and presumed interstitial edema.  Lasix has been provided and cardiology consulted. TRH contacted to place in the hospital for further relation and  management.  Assessment and Plan: * SOBOE (shortness of breath on exertion) -With complaints of shortness of breath with activity for the last 7-10 days. -Patient also expressed mild orthopnea on the day of admission. -Reporting improvement in her breathing; just trace pedal edema appreciated bilaterally. -On exam no wheezing, no crackles, breathing comfortable and speaking in full sentences. -Most recent ReDS clip measurement 19 (10/08/2021). -Diuretics transition to oral route; using Lasix 40 mg daily as per cardiology recommendations.. -Continue to follow electrolytes and further replete as needed. -Advised to maintain adequate hydration, to follow a low-sodium diet and to check weight on daily basis.   -2-D echo demonstrating EF=15-20%  -Continue farxiga, low dose bisoprolol, imdur and adjusted dose of hydralazine -amlodipine discontinued. -cardiology planning for event monitoring and cath as an outpatient.    Acute systolic CHF (congestive heart failure) (HCC) - Continue outpatient follow-up with cardiology service to identified main cause. -Event monitoring and cardiac cath anticipated as part of work-up. -Continue treatment with Lasix, Imdur, Farxiga, hydralazine and bisoprolol. -Given prior history of allergy to ACE inhibitors, hyperkalemia on admission and soft BP; no Entresto ordered currently.  Hyperkalemia -Potassium 6.2 at time of admission -No acute abnormalities appreciated on EKG or telemetry; patient received treatment with Lasix, insulin, bicarbonate and Lokelma -Potassium within normal limits at discharge. -Continue to follow electrolytes level as an outpatient.  Stage 3a chronic kidney disease (HCC) -Appears to be stable and at baseline currently. -Increase noticed encounter creatinine level after diuresis; but per GFR remains at baseline. -Close monitoring of renal function electrolytes as an outpatient advised.  Gastroesophageal reflux disease -Continue the  use of pepcid -Lifestyle changes discussed  with patient.[name]  COPD GOLD 1  -Currently no wheezing appreciated -No using accessory muscles and with good saturation -Continue home maintenance therapy and as needed bronchodilator management.  Anxiety -Overall mood stable -No suicidal ideation or hallucinations appreciated. -Continue as needed anxiolytics and continue the use of Effexor and Cymbalta.  Chronic low back pain -Continue home analgesic regimen. -Stable overall.  Gout -no acute Flare appreciated -Continue the use of allopurinol.  Essential hypertension -Uncontrolled and elevated at time of admission -currently stable with current regimen -continue to follow VS -Heart healthy/low-sodium diet discussed with patient.    Consultants: Cardiology service Procedures performed: See below for x-ray report; 2D echo demonstrating decreased ejection fraction (15-20%) with diffuse hypokinesis. Disposition: Home Diet recommendation: Heart healthy/low-sodium diet.  DISCHARGE MEDICATION: Allergies as of 10/10/2021       Reactions   Lovastatin Swelling   Patient states that her tongue swells and she gets a tingling feeling all over. Patient states that her tongue swells and she gets a tingling feeling all over.   Lisinopril         Medication List     STOP taking these medications    amLODipine-olmesartan 5-20 MG tablet Commonly known as: AZOR   gabapentin 300 MG capsule Commonly known as: NEURONTIN   hydrochlorothiazide 12.5 MG tablet Commonly known as: HYDRODIURIL       TAKE these medications    allopurinol 300 MG tablet Commonly known as: ZYLOPRIM Take 0.5 tablets (150 mg total) by mouth daily.   aspirin EC 81 MG tablet Take 1 tablet (81 mg total) by mouth daily. Swallow whole.   atorvastatin 10 MG tablet Commonly known as: LIPITOR Take 1 tablet (10 mg total) by mouth daily.   bisoprolol 5 MG tablet Commonly known as: ZEBETA Take 0.5 tablets (2.5 mg  total) by mouth daily. Start taking on: October 11, 2021   Breztri Aerosphere 160-9-4.8 MCG/ACT Aero Generic drug: Budeson-Glycopyrrol-Formoterol Inhale 2 puffs into the lungs 2 (two) times daily. What changed: when to take this   dapagliflozin propanediol 10 MG Tabs tablet Commonly known as: FARXIGA Take 1 tablet (10 mg total) by mouth daily. Start taking on: October 11, 2021   DIALYVITE VITAMIN D 5000 PO Take by mouth.   famotidine 20 MG tablet Commonly known as: Pepcid Take 1 tablet (20 mg total) by mouth at bedtime. One after supper What changed:  how much to take how to take this when to take this   furosemide 40 MG tablet Commonly known as: LASIX Take 1 tablet (40 mg total) by mouth daily. Start taking on: October 11, 2021   hydrALAZINE 25 MG tablet Commonly known as: APRESOLINE Take 1 tablet (25 mg total) by mouth 2 (two) times daily.   HYDROcodone-acetaminophen 5-325 MG tablet Commonly known as: NORCO/VICODIN Take 1 tablet by mouth every 6 (six) hours as needed for moderate pain.   isosorbide mononitrate 30 MG 24 hr tablet Commonly known as: IMDUR Take 1 tablet (30 mg total) by mouth daily. Start taking on: October 11, 2021   ketoconazole 2 % cream Commonly known as: NIZORAL Apply 1 application topically daily.   ProAir HFA 108 (90 Base) MCG/ACT inhaler Generic drug: albuterol Inhale 2 puffs into the lungs every 6 (six) hours as needed for wheezing or shortness of breath.   venlafaxine XR 37.5 MG 24 hr capsule Commonly known as: EFFEXOR-XR Take 37.5 mg by mouth See admin instructions. Take 2 capsules by mouth daily   zolpidem 10 MG tablet Commonly known  as: AMBIEN Take 1 tablet (10 mg total) by mouth at bedtime.        Discharge Exam: Filed Weights   10/08/21 0500 10/09/21 0533 10/10/21 0441  Weight: 72.9 kg 73.2 kg 71 kg   General exam: Alert, awake, oriented x 3; denies chest pain, no nausea, no vomiting good saturation on room air and expressing  no orthopnea.  Feeling ready to go home. Respiratory system: Clear to auscultation.  No using accessory muscles. Cardiovascular system:RRR. No rubs or gallops. Gastrointestinal system: Abdomen is nondistended, soft and nontender. No organomegaly or masses felt. Normal bowel sounds heard. Central nervous system: Alert and oriented. No focal neurological deficits. Extremities: No cyanosis, no clubbing. Skin: No petechiae. Psychiatry: Judgement and insight appear normal. Mood & affect appropriate.    Condition at discharge: Stable and improved.  The results of significant diagnostics from this hospitalization (including imaging, microbiology, ancillary and laboratory) are listed below for reference.   Imaging Studies: ECHOCARDIOGRAM COMPLETE  Result Date: 10/07/2021    ECHOCARDIOGRAM REPORT   Patient Name:   Heather [name] Date of Exam: 10/07/2021 Medical Rec #:  [id number]      Height:       62.5 in Accession #:    [id number]     Weight:       163.6 lb Date of Birth:  10-30-1940      BSA:          1.766 m Patient Age:    80 years       BP:           146/93 mmHg Patient Gender: F              HR:           111 bpm. [facility] Procedure: 2D Echo, Cardiac Doppler, Color Doppler and Intracardiac            Opacification Agent Indications:    Acute respiratory distress  History:        Patient has prior history of Echocardiogram examinations, most                 recent 12/14/2020. COPD and Stroke, Signs/Symptoms:Edema,                 Dyspnea and Shortness of Breath; Risk Factors:Hypertension and                 Dyslipidemia.  Sonographer:    Mikki Harbor Referring Phys: [phone] ROBERT LOCKWOOD IMPRESSIONS  1. Left ventricular ejection fraction, by estimation, is 15-20%. The left ventricle has severely decreased function. The left ventricle demonstrates global hypokinesis. Indeterminate diastolic filling due to E-A fusion.  2. Right ventricular systolic function is mildly reduced. The right  ventricular size is mildly enlarged. There is moderately elevated pulmonary artery systolic pressure. The estimated right ventricular systolic pressure is 49.0 mmHg.  3. Left atrial size was moderately dilated.  4. The mitral valve is grossly normal. Mild to moderate mitral valve regurgitation. No evidence of mitral stenosis.  5. The aortic valve is tricuspid. There is mild calcification of the aortic valve. There is mild thickening of the aortic valve. Aortic valve regurgitation is not visualized. Aortic valve sclerosis/calcification is present, without any evidence of aortic stenosis.  6. The inferior vena cava is normal in size with <50% respiratory variability, suggesting right atrial pressure of 8 mmHg. Comparison(s): Changes from prior study are noted. The left ventricular function is worsened. FINDINGS  Left Ventricle:  Left ventricular ejection fraction, by estimation, is 15-20%. The left ventricle has severely decreased function. The left ventricle demonstrates global hypokinesis. Definity contrast agent was given IV to delineate the left ventricular endocardial borders. The left ventricular internal cavity size was normal in size. There is no left ventricular hypertrophy. Abnormal (paradoxical) septal motion, consistent with left bundle branch block. Indeterminate diastolic filling due to E-A fusion. Right Ventricle: The right ventricular size is mildly enlarged. No increase in right ventricular wall thickness. Right ventricular systolic function is mildly reduced. There is moderately elevated pulmonary artery systolic pressure. The tricuspid regurgitant velocity is 3.20 m/s, and with an assumed right atrial pressure of 8 mmHg, the estimated right ventricular systolic pressure is 49.0 mmHg. Left Atrium: Left atrial size was moderately dilated. Right Atrium: Right atrial size was normal in size. Pericardium: There is no evidence of pericardial effusion. Mitral Valve: The mitral valve is grossly normal. Mild  to moderate mitral valve regurgitation. No evidence of mitral valve stenosis. MV peak gradient, 8.9 mmHg. The mean mitral valve gradient is 3.0 mmHg. Tricuspid Valve: The tricuspid valve is grossly normal. Tricuspid valve regurgitation is mild . No evidence of tricuspid stenosis. Aortic Valve: The aortic valve is tricuspid. There is mild calcification of the aortic valve. There is mild thickening of the aortic valve. Aortic valve regurgitation is not visualized. Aortic valve sclerosis/calcification is present, without any evidence of aortic stenosis. Aortic valve mean gradient measures 2.0 mmHg. Aortic valve peak gradient measures 5.5 mmHg. Aortic valve area, by VTI measures 1.48 cm. Pulmonic Valve: The pulmonic valve was grossly normal. Pulmonic valve regurgitation is mild to moderate. No evidence of pulmonic stenosis. Aorta: The aortic root and ascending aorta are structurally normal, with no evidence of dilitation. Venous: The inferior vena cava is normal in size with less than 50% respiratory variability, suggesting right atrial pressure of 8 mmHg. IAS/Shunts: The atrial septum is grossly normal.  LEFT VENTRICLE PLAX 2D LVIDd:         5.05 cm      Diastology LVIDs:         4.80 cm      LV e' medial:    10.10 cm/s LV PW:         1.05 cm      LV E/e' medial:  11.0 LV IVS:        0.95 cm      LV e' lateral:   14.70 cm/s LVOT diam:     2.00 cm      LV E/e' lateral: 7.6 LV SV:         32 LV SV Index:   18 LVOT Area:     3.14 cm  LV Volumes (MOD) LV vol d, MOD A4C: 128.0 ml LV SV MOD A4C:     128.0 ml RIGHT VENTRICLE RV Basal diam:  4.45 cm RV Mid diam:    3.00 cm LEFT ATRIUM             Index        RIGHT ATRIUM           Index LA diam:        4.40 cm 2.49 cm/m   RA Area:     17.60 cm LA Vol (A2C):   76.4 ml 43.27 ml/m  RA Volume:   50.90 ml  28.83 ml/m LA Vol (A4C):   84.7 ml 47.97 ml/m LA Biplane Vol: 84.4 ml 47.80 ml/m  AORTIC VALVE  PULMONIC VALVE AV Area (Vmax):    1.80 cm     PV Vmax:        0.61 m/s AV Area (Vmean):   1.70 cm     PV Peak grad:  1.5 mmHg AV Area (VTI):     1.48 cm AV Vmax:           117.00 cm/s AV Vmean:          70.300 cm/s AV VTI:            0.219 m AV Peak Grad:      5.5 mmHg AV Mean Grad:      2.0 mmHg LVOT Vmax:         67.20 cm/s LVOT Vmean:        38.100 cm/s LVOT VTI:          0.103 m LVOT/AV VTI ratio: 0.47  AORTA Ao Root diam: 2.80 cm MITRAL VALVE                TRICUSPID VALVE MV Area (PHT): 6.71 cm     TR Peak grad:   41.0 mmHg MV Area VTI:   1.24 cm     TR Vmax:        320.00 cm/s MV Peak grad:  8.9 mmHg MV Mean grad:  3.0 mmHg     SHUNTS MV Vmax:       1.49 m/s     Systemic VTI:  0.10 m MV Vmean:      68.0 cm/s    Systemic Diam: 2.00 cm MV Decel Time: 113 msec MV E velocity: 111.00 cm/s Lennie Odor MD Electronically signed by Lennie Odor MD Signature Date/Time: 10/07/2021/5:04:27 PM    Final    DG Chest Portable 1 View  Result Date: 10/07/2021 CLINICAL DATA:  Shortness of breath with exertion x2 weeks. EXAM: PORTABLE CHEST 1 VIEW COMPARISON:  Chest radiograph Aug 02, 2021 FINDINGS: Enlarged cardiac silhouette with central vascular prominence. Tortuous atherosclerotic thoracic aorta. Reduced lung volumes. Mild interstitial opacities. Small left pleural effusion. The visualized skeletal structures are unremarkable. IMPRESSION: Enlarged cardiac silhouette with central vascular prominence and probable mild interstitial edema with a small left pleural effusion. Aortic Atherosclerosis (ICD10-I70.0). Electronically Signed   By: Maudry Mayhew M.D.   On: 10/07/2021 12:45   DG Foot Complete Right  Result Date: 10/03/2021 Please see detailed radiograph report in office note.  DG Pelvis 1-2 Views  Result Date: 09/10/2021 CLINICAL DATA:  Recent fall with pelvic pain, initial encounter EXAM: PELVIS - 1 VIEW COMPARISON:  10/28/2019 FINDINGS: Pelvic ring is intact. 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 Screw fragments are again noted on the left and stable. No acute  is noted. IVC filter is noted in the lower abdomen. IMPRESSION: No acute abnormality noted. Electronically Signed   By: Alcide Clever M.D.   On: 09/10/2021 21:45   DG Lumbar Spine Complete  Result Date: 09/10/2021 CLINICAL DATA:  Recent fall with low back pain, initial encounter EXAM: LUMBAR SPINE - COMPLETE 4+ VIEW COMPARISON:  10/28/2019 FINDINGS: Five lumbar type vertebral bodies are well visualized. Vertebral body height is well maintained. Mild anterolisthesis of L5 on S1 is noted stable in appearance from the prior exam. Facet hypertrophic changes are seen. Disc space narrowing at L4-5 is noted. IVC filter is noted in place. One of the struts is fractured but stable in appearance. 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 Multiple fractured fixation screws are noted but stable. IMPRESSION: Degenerative change without acute abnormality. Electronically  Signed   By: Alcide Clever M.D.   On: 09/10/2021 21:44   DG Wrist Complete Right  Result Date: 09/10/2021 CLINICAL DATA:  Right wrist pain following fall, initial encounter EXAM: RIGHT WRIST - COMPLETE 3+ VIEW COMPARISON:  None Available. FINDINGS: Mild degenerative changes of the first [facility] joint are noted. No acute fracture or dislocation is seen. No soft tissue changes are noted. IMPRESSION: Degenerative change without acute abnormality. Electronically Signed   By: Alcide Clever M.D.   On: 09/10/2021 21:42   DG Wrist Complete Left  Result Date: 09/10/2021 CLINICAL DATA:  Recent fall with wrist pain, initial encounter EXAM: LEFT WRIST - COMPLETE 3+ VIEW COMPARISON:  None Available. FINDINGS: Mild degenerative changes of the first [facility] joint are seen. No acute fracture or dislocation is noted. No soft tissue abnormality is seen. IMPRESSION: No acute abnormality noted. Electronically Signed   By: Alcide Clever M.D.   On: 09/10/2021 21:42   CT Cervical Spine Wo Contrast  Result Date: 09/10/2021 CLINICAL DATA:  Polytrauma, blunt EXAM: CT CERVICAL SPINE WITHOUT  CONTRAST TECHNIQUE: Multidetector CT imaging of the cervical spine was performed without intravenous contrast. Multiplanar CT image reconstructions were also generated. RADIATION DOSE REDUCTION: This exam was performed according to the departmental dose-optimization program which includes automated exposure control, adjustment of the mA and/or kV according to patient size and/or use of iterative reconstruction technique. COMPARISON:  None Available. FINDINGS: Alignment: Normal Skull base and vertebrae: No acute fracture. No primary bone lesion or focal pathologic process. Soft tissues and spinal canal: No prevertebral fluid or swelling. No visible canal hematoma. 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 Upper chest: No acute findings Other: None IMPRESSION: Diffuse degenerative disc and facet disease. No acute bony abnormality. Electronically Signed   By: Charlett Nose M.D.   On: 09/10/2021 21:38   CT Head Wo Contrast  Result Date: 09/10/2021 CLINICAL DATA:  Polytrauma, blunt.  Fall. EXAM: CT HEAD WITHOUT CONTRAST TECHNIQUE: Contiguous axial images were obtained from the base of the skull through the vertex without intravenous contrast. RADIATION DOSE REDUCTION: This exam was performed according to the departmental dose-optimization program which includes automated exposure control, adjustment of the mA and/or kV according to patient size and/or use of iterative reconstruction technique. COMPARISON:  12/13/2020 FINDINGS: Brain: No acute intracranial abnormality. Specifically, no hemorrhage, hydrocephalus, mass lesion, acute infarction, or significant intracranial injury. Vascular: No hyperdense vessel or unexpected calcification. Skull: No acute calvarial abnormality. Sinuses/Orbits: No acute findings Other: None IMPRESSION: No acute intracranial abnormality. Electronically Signed   By: Charlett Nose M.D.   On: 09/10/2021 21:38    Microbiology: Results for orders placed or performed  during the hospital encounter of 02/27/21  Resp Panel by RT-PCR (Flu A&B, Covid) Nasopharyngeal Swab     Status: None   Collection Time: 02/27/21  1:37 PM   Specimen: Nasopharyngeal Swab; Nasopharyngeal(NP) swabs in vial transport medium  Result Value Ref Range Status   SARS Coronavirus 2 by RT PCR NEGATIVE NEGATIVE Final    Comment: (NOTE) SARS-CoV-2 target nucleic acids are NOT DETECTED.  The SARS-CoV-2 RNA is generally detectable in upper respiratory specimens during the acute phase of infection. The lowest concentration of SARS-CoV-2 viral copies this assay can detect is 138 copies/mL. A negative result does not preclude SARS-Cov-2 infection and should not be used as the sole basis for treatment or other patient management decisions. A negative result may occur with  improper specimen collection/handling, submission of specimen other than  nasopharyngeal swab, presence of viral mutation(s) within the areas targeted by this assay, and inadequate number of viral copies(<138 copies/mL). A negative result must be combined with clinical observations, patient history, and epidemiological information. The expected result is Negative.  Fact Sheet for Patients:  BloggerCourse.com  Fact Sheet for Healthcare Providers:  SeriousBroker.it  This test is no t yet approved or cleared by the [place] FDA and  has been authorized for detection and/or diagnosis of SARS-CoV-2 by FDA under an Emergency Use Authorization (EUA). This EUA will remain  in effect (meaning this test can be used) for the duration of the COVID-19 declaration under Section 564(b)(1) of the Act, 21 U.S.C.section 360bbb-3(b)(1), unless the authorization is terminated  or revoked sooner.       Influenza A by PCR NEGATIVE NEGATIVE Final   Influenza B by PCR NEGATIVE NEGATIVE Final    Comment: (NOTE) The Xpert Xpress SARS-CoV-2/FLU/RSV plus assay is intended as an  aid in the diagnosis of influenza from Nasopharyngeal swab specimens and should not be used as a sole basis for treatment. Nasal washings and aspirates are unacceptable for Xpert Xpress SARS-CoV-2/FLU/RSV testing.  Fact Sheet for Patients: BloggerCourse.com  Fact Sheet for Healthcare Providers: SeriousBroker.it  This test is not yet approved or cleared by the [place] FDA and has been authorized for detection and/or diagnosis of SARS-CoV-2 by FDA under an Emergency Use Authorization (EUA). This EUA will remain in effect (meaning this test can be used) for the duration of the COVID-19 declaration under Section 564(b)(1) of the Act, 21 U.S.C. section 360bbb-3(b)(1), unless the authorization is terminated or revoked.  Performed at [facility], [address], [place], [place] [address]     Labs: CBC: Recent Labs  Lab 10/07/21 1242  WBC 4.2  NEUTROABS 2.6  HGB 12.6  HCT 39.2  MCV 93.8  PLT 211   Basic Metabolic Panel: Recent Labs  Lab 10/07/21 1242 10/07/21 1730 10/08/21 0417 10/09/21 0340 10/10/21 0510  NA 136  --  140 142 144  K 6.2*  --  4.0 3.3* 3.8  CL 106  --  105 106 106  CO2 23  --  26 29 31   GLUCOSE 93  --  137* 111* 106*  BUN 24*  --  29* 38* 32*  CREATININE 1.00  --  1.00 1.19* 1.22*  CALCIUM 9.2  --  9.4 8.8* 8.9  MG  --  1.6*  --   --   --   PHOS  --  3.3  --   --   --    Liver Function Tests: Recent Labs  Lab 10/07/21 1242  AST 66*  ALT 39  ALKPHOS 76  BILITOT 1.7*  PROT 7.3  ALBUMIN 4.3   CBG: No results for input(s): "GLUCAP" in the last 168 hours.  Discharge time spent: greater than 30 minutes.  Signed: 12/07/21, MD [facility] 10/10/2021

## 2021-10-10 NOTE — Progress Notes (Signed)
Patient stable rested well overnight once prn Ambien administered. Patient received prn pain medication for chronic back pain. Assisted patient with putting her dentures in this am.

## 2021-10-10 NOTE — Progress Notes (Signed)
DAILY PROGRESS NOTE   Patient Name: Heather [name] Date of Encounter: 10/10/2021 Cardiologist: None  Chief Complaint   Breathing has improved  Patient Profile   Heather [name] is a 81 y.o. female with a hx of COPD, hypertension, stroke who is being seen today for the evaluation of hypoxic respiratory failure at the request of Heather Dubois, MD.  Subjective   Remains net negative - now 1.9L recorded. 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 No angina reported. BP now better controlled.  Hyperkalemia has been corrected. Creatinine is trending up at 1.22 - REDSvest demonstrates euvolemia.   Objective   Vitals:   10/10/21 0441 10/10/21 0457 10/10/21 0458 10/10/21 0722  BP:  112/71 112/71   Pulse:  84 81   Resp:  18 16   Temp:  98.2 F (36.8 C) 98.2 F (36.8 C)   TempSrc:  Oral    SpO2:  94% 95% 94%  Weight: 71 kg     Height:        Intake/Output Summary (Last 24 hours) at 10/10/2021 0836 Last data filed at 10/10/2021 0617 Gross per 24 hour  Intake 1320 ml  Output 2000 ml  Net -680 ml   Filed Weights   10/08/21 0500 10/09/21 0533 10/10/21 0441  Weight: 72.9 kg 73.2 kg 71 kg    Physical Exam   General appearance: alert and no distress Lungs: clear to auscultation bilaterally Heart: regular rate and rhythm Extremities: edema trace sockline Neurologic: Grossly normal  Inpatient Medications    Scheduled Meds:  allopurinol  150 mg Oral Daily   aspirin EC  81 mg Oral Daily   atorvastatin  10 mg Oral Daily   bisoprolol  2.5 mg Oral Daily   dapagliflozin propanediol  10 mg Oral Daily   famotidine  20 mg Oral QHS   feeding supplement  237 mL Oral BID BM   furosemide  30 mg Oral Daily   heparin  5,000 Units Subcutaneous Q8H   hydrALAZINE  25 mg Oral Q8H   isosorbide mononitrate  30 mg Oral Daily   mometasone-formoterol  2 puff Inhalation BID   sodium chloride flush  3 mL Intravenous Q12H   umeclidinium bromide  1 puff  Inhalation Daily   venlafaxine XR  37.5 mg Oral Daily    Continuous Infusions:  sodium chloride      PRN Meds: sodium chloride, acetaminophen **OR** acetaminophen, albuterol, albuterol, ondansetron **OR** ondansetron (ZOFRAN) IV, mouth rinse, oxyCODONE, sodium chloride flush, zolpidem   Labs   Results for orders placed or performed during the hospital encounter of 10/07/21 (from the past 48 hour(s))  Basic metabolic panel     Status: Abnormal   Collection Time: 10/09/21  3:40 AM  Result Value Ref Range   Sodium 142 135 - 145 mmol/L   Potassium 3.3 (L) 3.5 - 5.1 mmol/L    Comment: DELTA CHECK NOTED   Chloride 106 98 - 111 mmol/L   CO2 29 22 - 32 mmol/L   Glucose, Bld 111 (H) 70 - 99 mg/dL    Comment: Glucose reference range applies only to samples taken after fasting for at least 8 hours.   BUN 38 (H) 8 - 23 mg/dL   Creatinine, Ser 1.19 (H) 0.44 - 1.00 mg/dL   Calcium 8.8 (L) 8.9 - 10.3 mg/dL   GFR, Estimated 47 (L) >60 mL/min    Comment: (NOTE) Calculated using the CKD-EPI Creatinine Equation (2021)    Anion  gap 7 5 - 15    Comment: Performed at [facility], [address] [place], [place] [address]  Basic metabolic panel     Status: Abnormal   Collection Time: 10/10/21  5:10 AM  Result Value Ref Range   Sodium 144 135 - 145 mmol/L   Potassium 3.8 3.5 - 5.1 mmol/L   Chloride 106 98 - 111 mmol/L   CO2 31 22 - 32 mmol/L   Glucose, Bld 106 (H) 70 - 99 mg/dL    Comment: Glucose reference range applies only to samples taken after fasting for at least 8 hours.   BUN 32 (H) 8 - 23 mg/dL   Creatinine, Ser 2.59 (H) 0.44 - 1.00 mg/dL   Calcium 8.9 8.9 - 56.3 mg/dL   GFR, Estimated 45 (L) >60 mL/min    Comment: (NOTE) Calculated using the CKD-EPI Creatinine Equation (2021)    Anion gap 7 5 - 15    Comment: Performed at [facility], [address], [place], [place] [address]    ECG   N/A  Telemetry   Sinus rhythm with PVC's - Personally Reviewed  Radiology    No  results found.  Cardiac Studies   Procedure: 2D Echo, Cardiac Doppler, Color Doppler and Intracardiac             Opacification Agent   Indications:    Acute respiratory distress     History:        Patient has prior history of Echocardiogram examinations,  most                  recent 12/14/2020. COPD and Stroke, Signs/Symptoms:Edema,                  Dyspnea and Shortness of Breath; Risk Factors:Hypertension  and                  Dyslipidemia.     Sonographer:    Heather [name]  Referring Phys: [phone] Heather [name]   IMPRESSIONS     1. Left ventricular ejection fraction, by estimation, is 15-20%. The left  ventricle has severely decreased function. The left ventricle demonstrates  global hypokinesis. Indeterminate diastolic filling due to E-A fusion.   2. Right ventricular systolic function is mildly reduced. The right  ventricular size is mildly enlarged. There is moderately elevated  pulmonary artery systolic pressure. The estimated right ventricular  systolic pressure is 49.0 mmHg.   3. Left atrial size was moderately dilated.   4. The mitral valve is grossly normal. Mild to moderate mitral valve  regurgitation. No evidence of mitral stenosis.   5. The aortic valve is tricuspid. There is mild calcification of the  aortic valve. There is mild thickening of the aortic valve. Aortic valve  regurgitation is not visualized. Aortic valve sclerosis/calcification is  present, without any evidence of  aortic stenosis.   6. The inferior vena cava is normal in size with <50% respiratory  variability, suggesting right atrial pressure of 8 mmHg.   Comparison(s): Changes from prior study are noted. The left ventricular  function is worsened.  Assessment   Principal Problem:   SOBOE (shortness of breath on exertion) Active Problems:   Essential hypertension   Gout   Chronic low back pain   Anxiety   COPD GOLD 1    Gastroesophageal reflux disease   Stage 3a chronic kidney  disease (HCC)   Hyperkalemia   Acute systolic CHF (congestive heart failure) (HCC)   Plan  Heather [name] is found to have a new cardiomyopathy with LVEF <20%, global hypokinesis. 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 Ischemic seems less likely, although would need ischemic eval as outpatient (cath) - she is having PVC's, ?PVC-related cardiomyopathy - would recommend 2 week outpatient monitor to evaluate burden of PVC's. She has mild to moderate MR which is functional. Given her hypotension and to improve compliance, will reduce hydralazine to 25 mg BID. Change lasix to 40 mg daily starting tomorrow given rising creatinine -hold today (there is no 30 mg pill, this is not ideal for patient compliance). Continue low dose bisoprolol, farxiga and imdur.  BP will not allow ARB/ARNI and was hyperkalemic on admission. Will arrange for follow-up with Dr. Flora Lipps. Suspect she could be discharged later today.  Time Spent Directly with Patient:  I have spent a total of 25 minutes with the patient reviewing hospital notes, telemetry, EKGs, labs and examining the patient as well as establishing an assessment and plan that was discussed personally with the patient.  > 50% of time was spent in direct patient care.  Length of Stay:  LOS: 1 day   Chrystie Nose, MD, [facility], FACP  [place]  [facility]  Medical Director of the Advanced Lipid Disorders &  Cardiovascular Risk Reduction Clinic Diplomate of the American Board of Clinical Lipidology Attending Cardiologist  Direct Dial: [phone]  Fax: [phone]  Website:  [email or web address] 10/10/2021, 8:36 AM

## 2021-10-10 NOTE — Assessment & Plan Note (Signed)
-   Continue outpatient follow-up with cardiology service to identified main cause. -Event monitoring and cardiac cath anticipated as part of work-up. -Continue treatment with Lasix, Imdur, Farxiga, hydralazine and bisoprolol. -Given prior history of allergy to ACE inhibitors, hyperkalemia on admission and soft BP; no Entresto ordered currently.

## 2021-10-10 NOTE — Telephone Encounter (Signed)
Zio 14 day monitor place on patient.Instructions for use and when to return give. She was also advised of 24 hour Zio contact number.   Serial number [id number]

## 2021-10-10 NOTE — Telephone Encounter (Signed)
Message from [facility], PA-C : she also needs a 2 week Zio for PVC's so will have her come to office after she is discharged.

## 2021-10-11 ENCOUNTER — Ambulatory Visit: Payer: Medicare HMO | Admitting: Pulmonary Disease

## 2021-10-17 ENCOUNTER — Ambulatory Visit (INDEPENDENT_AMBULATORY_CARE_PROVIDER_SITE_OTHER): Payer: Medicare HMO

## 2021-10-17 ENCOUNTER — Ambulatory Visit (INDEPENDENT_AMBULATORY_CARE_PROVIDER_SITE_OTHER): Payer: Medicare HMO | Admitting: Podiatry

## 2021-10-17 DIAGNOSIS — Z9889 Other specified postprocedural states: Secondary | ICD-10-CM

## 2021-10-17 DIAGNOSIS — M2041 Other hammer toe(s) (acquired), right foot: Secondary | ICD-10-CM

## 2021-10-17 DIAGNOSIS — M2031 Hallux varus (acquired), right foot: Secondary | ICD-10-CM | POA: Diagnosis not present

## 2021-10-17 NOTE — Progress Notes (Signed)
  Subjective:  Patient ID: Heather [name], female    DOB: May 10, 1940,  MRN: [id number]  Chief Complaint  Patient presents with   Routine Post Op     GREAT TOE JOINT FUSION, REMOVAL OF BONES BEHIND TOES, HAMMERTOE CORRECTION 2-5 RT     81 y.o. female returns for post-op check.  She is doing well she not having any pain and remains pleased with her correction.  Review of Systems: Negative except as noted in the HPI. Denies N/V/F/Ch.   Objective:  There were no vitals filed for this visit. There is no height or weight on file to calculate BMI. Constitutional Well developed. Well nourished.  Vascular Foot warm and well perfused. Capillary refill normal to all digits.  Calf is soft and supple, no posterior calf or knee pain, negative Homans' sign  Neurologic Normal speech. Oriented to person, place, and time. Epicritic sensation to light touch grossly present bilaterally.  Dermatologic Incisions are well-healed and nonhypertrophic  Orthopedic: She has minimal edema.  No pain to palpation.   Multiple view plain film radiographs: Good fusion across everything but the most dorsal lateral portion of the MTPJ, majority of toe arthrodesis sites have healed as well Assessment:   1. Post-operative state   2. Hallux varus, right   3. Hammertoe of right foot    Plan:  Patient was evaluated and treated and all questions answered.  S/p foot surgery right -Overall doing well.  There are good signs of healing noted on her x-rays and clinically.  Some fusion needs to remain in the dorsal lateral portion of the joint.  I recommended that she may transition back to regular shoe gear at this point gradually.  I will see her back in 6 weeks for new radiographs  Return in about 6 weeks (around 11/28/2021) for post op (new x-rays).

## 2021-10-19 ENCOUNTER — Other Ambulatory Visit (HOSPITAL_COMMUNITY)
Admission: RE | Admit: 2021-10-19 | Discharge: 2021-10-19 | Disposition: A | Discharge status: Home / Self Care | Payer: Medicare HMO | Source: Ambulatory Visit | Attending: Student | Admitting: Student

## 2021-10-19 ENCOUNTER — Ambulatory Visit: Payer: Medicare HMO | Admitting: Student

## 2021-10-19 ENCOUNTER — Encounter: Payer: Self-pay | Admitting: Student

## 2021-10-19 VITALS — BP 96/62 | HR 80 | Ht 62.5 in | Wt 154.6 lb

## 2021-10-19 DIAGNOSIS — I5021 Acute systolic (congestive) heart failure: Secondary | ICD-10-CM | POA: Diagnosis present

## 2021-10-19 DIAGNOSIS — I10 Essential (primary) hypertension: Secondary | ICD-10-CM | POA: Diagnosis not present

## 2021-10-19 DIAGNOSIS — I447 Left bundle-branch block, unspecified: Secondary | ICD-10-CM

## 2021-10-19 DIAGNOSIS — I493 Ventricular premature depolarization: Secondary | ICD-10-CM | POA: Diagnosis not present

## 2021-10-19 DIAGNOSIS — N1831 Chronic kidney disease, stage 3a: Secondary | ICD-10-CM

## 2021-10-19 NOTE — Patient Instructions (Signed)
Medication Instructions:  Your physician has recommended you make the following change in your medication:   Stop Taking Hydralazine   *If you need a refill on your cardiac medications before your next appointment, please call your pharmacy*   Lab Work: Your physician recommends that you return for lab work in: Today ( BMET, CBC)   If you have labs (blood work) drawn today and your tests are completely normal, you will receive your results only by: MyChart Message (if you have MyChart) OR A paper copy in the mail If you have any lab test that is abnormal or we need to change your treatment, we will call you to review the results.   Testing/Procedures: Your physician has requested that you have a cardiac catheterization. Cardiac catheterization is used to diagnose and/or treat various heart conditions. Doctors may recommend this procedure for a number of different reasons. The most common reason is to evaluate chest pain. Chest pain can be a symptom of coronary artery disease (CAD), and cardiac catheterization can show whether plaque is narrowing or blocking your heart's arteries. This procedure is also used to evaluate the valves, as well as measure the blood flow and oxygen levels in different parts of your heart. For further information please visit [email or web address] Please follow instruction sheet, as given.    Follow-Up: At [facility], you and your health needs are our priority.  As part of our continuing mission to provide you with exceptional heart care, we have created designated Provider Care Teams.  These Care Teams include your primary Cardiologist (physician) and Advanced Practice Providers (APPs -  Physician Assistants and Nurse Practitioners) who all work together to provide you with the care you need, when you need it.  We recommend signing up for the patient portal called "MyChart".  Sign up information is provided on this After Visit Summary.  MyChart is used to  connect with patients for Virtual Visits (Telemedicine).  Patients are able to view lab/test results, encounter notes, upcoming appointments, etc.  Non-urgent messages can be sent to your provider as well.   To learn more about what you can do with MyChart, go to [email or web address].    Your next appointment:   3-4 week(s)  The format for your next appointment:   In Person  Provider:   You may see Heather Harps, MD or one of the following Advanced Practice Providers on your designated Care Team:   Heather An, PA-C  Heather Reedy, PA-C     Other Instructions Thank you for choosing [facility]!    Heather [name] [address] [place] [address] Dept: [phone] Loc: [phone]  Heather [name]  10/19/2021  You are scheduled for a Cardiac Catheterization on Thursday, August 24 with Dr. Lance [name].  1. Please arrive at the Main Entrance A at [facility]: [address] [place], [place] [address] at 7:00 AM (This time is two hours before your procedure to ensure your preparation). Free valet parking service is available.   Special note: Every effort is made to have your procedure done on time. Please understand that emergencies sometimes delay scheduled procedures.  2. Diet: Do not eat solid foods after midnight.  You may have clear liquids until 5 AM upon the day of the procedure.  3. Labs: You will need to have blood drawn on Wednesday, August 16 at [facility]. You do not need to be fasting.  4. Medication  instructions in preparation for your procedure:   Contrast Allergy: No  Stop Taking Hydralazine    On the morning of your procedure, take Aspirin 81 mg and any morning medicines NOT listed above.  You may use sips of water.  5. Plan to go home the same day, you will only stay overnight if medically necessary. 6. You MUST have a responsible adult to  drive you home. 7. [name] adult MUST be with you the first 24 hours after you arrive home. 8. Bring a current list of your medications, and the last time and date medication taken. 9. Bring ID and current insurance cards. 10.Please wear clothes that are easy to get on and off and wear slip-on shoes.  Thank you for allowing [place] to care for you!   -- [facility]   Important Information About Sugar

## 2021-10-19 NOTE — H&P (View-Only) (Signed)
Cardiology Office Note    Date:  10/19/2021   ID:  Heather [name], DOB [birth year], MRN [id number]  PCP:  Pcp, No  Cardiologist: Evalina Field, MD    Chief Complaint  Patient presents with   Hospitalization Follow-up    History of Present Illness:    Heather [name] is a 81 y.o. female with past medical history of HTN, LBBB, COPD and prior CVA who presents to the office today for hospital follow-up.  She most recently presented to [facility] ED on 10/07/2021 for evaluation of acute dyspnea which had started earlier in the day prior to arrival and was found to have hypertensive emergency with BP at 192/103. BNP was elevated to 1731 and CXR showed probable mild interstitial edema with a small left pleural effusion. Echocardiogram during admission showed her EF was reduced at 15 to 20% with global hypokinesis and mildly reduced RV function. PASP was moderately elevated at 49 mmHg. She did have mild to moderate mitral valve regurgitation. She responded well with IV Lasix and this was transitioned to 40 mg daily at the time of discharge. She did have frequent PVC's on telemetry and it was recommended she have a 2-week monitor to assess her PVC burden. While an ischemic cardiomyopathy was felt to be less likely, it was recommended to plan for an outpatient cardiac catheterization for definitive evaluation. She was continued on Bisoprolol, Farxiga and Imdur for cardiomyopathy as BP was not felt to allow for ACE-I/ARB at that time. However, Hydralazine was listed on her AVS as she had been on this prior to admission.   In talking with the patient today, she reports much improvement in her symptoms since her hospitalization but says she does not feel "100%".  She denies any specific orthopnea or PND. She does utilize compression stockings on a daily basis and had been doing so prior to her admission. No specific chest pain or palpitations. She has been following her blood pressure at home and this has  been soft with her systolic BP in the 99991111 to 90's at times.  Past Medical History:  Diagnosis Date   Anemia    Anxiety    Arthritis    COPD (chronic obstructive pulmonary disease) (HCC)    CVA (cerebral vascular accident) ([place])    Depression    Dry eyes 04/14/2014   GERD (gastroesophageal reflux disease)    Glaucoma    Gout    HTN (hypertension)    OSA (obstructive sleep apnea)     Past Surgical History:  Procedure Laterality Date   ABDOMINAL HYSTERECTOMY     BACK SURGERY     lumbar-disc   BUNIONECTOMY Bilateral    FOOT SURGERY Left    repair of "kissing Cousins"   JOINT REPLACEMENT Bilateral    hip    JOINT REPLACEMENT Right 2010 or 2012   knee   KNEE SURGERY     SHOULDER ARTHROSCOPY Right    shoulder surgery in [place] about 2000   SHOULDER HEMI-ARTHROPLASTY Left 03/25/2014   Procedure: LEFT SHOULDER HEMI-ARTHROPLASTY;  Surgeon: Carole Civil, MD;  Location: [facility];  Service: Orthopedics;  Laterality: Left;    Current Medications: Outpatient Medications Prior to Visit  Medication Sig Dispense Refill   allopurinol (ZYLOPRIM) 300 MG tablet Take 0.5 tablets (150 mg total) by mouth daily. 45 tablet 1   aspirin EC 81 MG tablet Take 1 tablet (81 mg total) by mouth daily. Swallow whole. 150 tablet 2   atorvastatin (LIPITOR)  10 MG tablet Take 1 tablet (10 mg total) by mouth daily. 30 tablet 2   bisoprolol (ZEBETA) 5 MG tablet Take 0.5 tablets (2.5 mg total) by mouth daily. 30 tablet 1   Budeson-Glycopyrrol-Formoterol (BREZTRI AEROSPHERE) 160-9-4.8 MCG/ACT AERO Inhale 2 puffs into the lungs 2 (two) times daily. (Patient taking differently: Inhale 2 puffs into the lungs daily.) 10.7 g 3   Cholecalciferol (DIALYVITE VITAMIN D 5000 PO) Take by mouth.     dapagliflozin propanediol (FARXIGA) 10 MG TABS tablet Take 1 tablet (10 mg total) by mouth daily. 30 tablet 2   famotidine (PEPCID) 20 MG tablet Take 1 tablet (20 mg total) by mouth at bedtime. One after supper 30 tablet 3    furosemide (LASIX) 40 MG tablet Take 1 tablet (40 mg total) by mouth daily. 30 tablet 2   HYDROcodone-acetaminophen (NORCO/VICODIN) 5-325 MG tablet Take 1 tablet by mouth every 6 (six) hours as needed for moderate pain.     isosorbide mononitrate (IMDUR) 30 MG 24 hr tablet Take 1 tablet (30 mg total) by mouth daily. 30 tablet 2   ketoconazole (NIZORAL) 2 % cream Apply 1 application topically daily. 60 g 2   PROAIR HFA 108 (90 Base) MCG/ACT inhaler Inhale 2 puffs into the lungs every 6 (six) hours as needed for wheezing or shortness of breath. 18 g 2   venlafaxine XR (EFFEXOR-XR) 37.5 MG 24 hr capsule Take 37.5 mg by mouth See admin instructions. Take 2 capsules by mouth daily     hydrALAZINE (APRESOLINE) 25 MG tablet Take 1 tablet (25 mg total) by mouth 2 (two) times daily. 60 tablet 2   zolpidem (AMBIEN) 10 MG tablet Take 1 tablet (10 mg total) by mouth at bedtime. 30 tablet 5   No facility-administered medications prior to visit.     Allergies:   Lovastatin and Lisinopril   Social History   Socioeconomic History   Marital status: Divorced    Spouse name: Not on file   Number of children: Not on file   Years of education: Not on file   Highest education level: Not on file  Occupational History   Not on file  Tobacco Use   Smoking status: Former    Packs/day: 1.00    Years: 40.00    Total pack years: 40.00    Types: Cigarettes    Quit date: 03/20/1985    Years since quitting: 36.6   Smokeless tobacco: Never  Substance and Sexual Activity   Alcohol use: No   Drug use: No   Sexual activity: Yes    Birth control/protection: Surgical  Other Topics Concern   Not on file  Social History Narrative   Heather [name] is a 81 year old divorced female who lives alone. She has support of her son and sister.    Social Determinants of Health   Financial Resource Strain: Low Risk  (06/30/2020)   Overall Financial Resource Strain (CARDIA)    Difficulty of Paying Living Expenses: Not hard at  all  Food Insecurity: No Food Insecurity (06/30/2020)   Hunger Vital Sign    Worried About Running Out of Food in the Last Year: Never true    Ran Out of Food in the Last Year: Never true  Transportation Needs: No Transportation Needs (06/30/2020)   PRAPARE - Hydrologist (Medical): No    Lack of Transportation (Non-Medical): No  Physical Activity: Insufficiently Active (06/30/2020)   Exercise Vital Sign    Days of  Exercise per Week: 7 days    Minutes of Exercise per Session: 20 min  Stress: No Stress Concern Present (06/30/2020)   [place]    Feeling of Stress : Not at all  Social Connections: Moderately Isolated (06/30/2020)   Social Connection and Isolation Panel [NHANES]    Frequency of Communication with Friends and Family: More than three times a week    Frequency of Social Gatherings with Friends and Family: Once a week    Attends Religious Services: More than 4 times per year    Active Member of [employer] or Organizations: No    Attends Archivist Meetings: Never    Marital Status: Widowed     Family History:  The patient's family history includes Aneurysm in her father; Congestive Heart Failure in her niece; Diabetes in her brother; High blood pressure in her mother; Hypertension in her niece; Pancreatitis in her brother.   Review of Systems:    Please see the history of present illness.     All other systems reviewed and are otherwise negative except as noted above.   Physical Exam:    VS:  BP 96/62   Pulse 80   Ht 5' 2.5" (1.588 m)   Wt 154 lb 9.6 oz (70.1 kg)   SpO2 94%   BMI 27.83 kg/m    General: Well developed, well nourished,female appearing in no acute distress. Head: Normocephalic, atraumatic. Neck: No carotid bruits. JVD not elevated.  Lungs: Respirations regular and unlabored, without wheezes or rales.  Heart: Regular rate and rhythm. No S3 or S4.  2/6  systolic murmur along Apex.  Abdomen: Appears non-distended. No obvious abdominal masses. Msk:  Strength and tone appear normal for age. No obvious joint deformities or effusions. Extremities: No clubbing or cyanosis. No edema.  Distal pedal pulses are 2+ bilaterally. Neuro: Alert and oriented X 3. Moves all extremities spontaneously. No focal deficits noted. Psych:  Responds to questions appropriately with a normal affect. Skin: No rashes or lesions noted  Wt Readings from Last 3 Encounters:  10/19/21 154 lb 9.6 oz (70.1 kg)  10/10/21 156 lb 8.4 oz (71 kg)  09/10/21 165 lb (74.8 kg)     Studies/Labs Reviewed:   EKG:  EKG is not ordered today.    Recent Labs: 10/07/2021: ALT 39; B Natriuretic Peptide 1,731.0; Magnesium 1.6; TSH 0.983 10/19/2021: BUN 43; Creatinine, Ser 1.76; Hemoglobin 12.5; Platelets 242; Potassium 4.1; Sodium 136   Lipid Panel    Component Value Date/Time   CHOL 196 12/14/2020 0120   CHOL 193 01/13/2020 1458   TRIG 77 12/14/2020 0120   HDL 89 12/14/2020 0120   HDL 91 01/13/2020 1458   CHOLHDL 2.2 12/14/2020 0120   VLDL 15 12/14/2020 0120   LDLCALC 92 12/14/2020 0120   LDLCALC 85 01/13/2020 1458    Additional studies/ records that were reviewed today include:   Echocardiogram: 10/07/2021 IMPRESSIONS     1. Left ventricular ejection fraction, by estimation, is 15-20%. The left  ventricle has severely decreased function. The left ventricle demonstrates  global hypokinesis. Indeterminate diastolic filling due to E-A fusion.   2. Right ventricular systolic function is mildly reduced. The right  ventricular size is mildly enlarged. There is moderately elevated  pulmonary artery systolic pressure. The estimated right ventricular  systolic pressure is 99991111 mmHg.   3. Left atrial size was moderately dilated.   4. The mitral valve is grossly normal. Mild to moderate mitral  valve  regurgitation. No evidence of mitral stenosis.   5. The aortic valve is  tricuspid. There is mild calcification of the  aortic valve. There is mild thickening of the aortic valve. Aortic valve  regurgitation is not visualized. Aortic valve sclerosis/calcification is  present, without any evidence of  aortic stenosis.   6. The inferior vena cava is normal in size with <50% respiratory  variability, suggesting right atrial pressure of 8 mmHg.   Comparison(s): Changes from prior study are noted. The left ventricular  function is worsened.   Assessment:    1. Acute systolic CHF (congestive heart failure) (HCC)   2. LBBB (left bundle branch block)   3. PVC's (premature ventricular contractions)   4. Essential hypertension   5. Stage 3a chronic kidney disease (HCC)      Plan:   In order of problems listed above:  1. HFrEF/Mitral Regurgitation - Recent echocardiogram showed her EF was reduced at 15 to 20% with global hypokinesis, mildly reduced RV function, PASP at 49 mmHg and mild to moderate mitral valve regurgitation. As discussed during her admission, a cardiac catheterization was recommended given her cardiomyopathy. Reviewed this with the patient today and she is in agreement to proceed. Will arrange for a R/LHC next week. Recheck CBC and BMET today.  - Will continue Bisoprolol 2.5 mg daily, Farxiga 10 mg daily, Lasix 40 mg daily and Imdur 30 mg daily for her cardiomyopathy. I did recommend stopping Hydralazine at this time given her hypotension. BP does not allow for ACE-I/ARB/ARNI/Spiro currently but would anticipate adding low-dose Spironolactone or Losartan over restarting Hydralazine if BP allows at subsequent visits.   ADDENDUM: Repeat labs show her creatinine has increased from 1.22 to 1.76. I called the patient and she will reduce Lasix from 40mg  daily to 20mg  daily. Will recheck a BMET next Monday prior to her procedure.   2. LBBB - Noted on prior tracings and will plan for ischemic evaluation as outlined above. If her EF remains reduced despite  optimization of medical therapy, she may be a candidate for CRT.  3. PVC's - She was noted to have frequent PVC's during her admission and a 14-day Zio patch is currently in place. Will follow-up on the results once available. Remains on Bisoprolol 2.5 mg daily.  4. HTN - Her BP is at 96/62 during today's visit and has been soft at home with systolic BP typically in the 80's to 90's. She is currently on Bisoprolol 2.5 mg daily, Farxiga 10 mg daily, Lasix 40 mg daily, Imdur 30 mg daily and Hydralazine 25 mg twice daily. I did recommend that she stop Hydralazine at this time.  5. Stage 3 CKD - Baseline creatinine 1.0 - 1.1. Will recheck a BMET today given recent medication adjustments and upcoming catheterization.   Shared Decision Making/Informed Consent:   Shared Decision Making/Informed Consent The risks [stroke (1 in 1000), death (1 in 1000), kidney failure [usually temporary] (1 in 500), bleeding (1 in 200), allergic reaction [possibly serious] (1 in 200)], benefits (diagnostic support and management of coronary artery disease) and alternatives of a cardiac catheterization were discussed in detail with Heather [name] and she is willing to proceed.    Medication Adjustments/Labs and Tests Ordered: Current medicines are reviewed at length with the patient today.  Concerns regarding medicines are outlined above.  Medication changes, Labs and Tests ordered today are listed in the Patient Instructions below. Patient Instructions  Medication Instructions:  Your physician has recommended you make the following  change in your medication:   Stop Taking Hydralazine   *If you need a refill on your cardiac medications before your next appointment, please call your pharmacy*   Lab Work: Your physician recommends that you return for lab work in: Today ( BMET, CBC)   If you have labs (blood work) drawn today and your tests are completely normal, you will receive your results only by: MyChart Message  (if you have MyChart) OR A paper copy in the mail If you have any lab test that is abnormal or we need to change your treatment, we will call you to review the results.   Testing/Procedures: Your physician has requested that you have a cardiac catheterization. Cardiac catheterization is used to diagnose and/or treat various heart conditions. Doctors may recommend this procedure for a number of different reasons. The most common reason is to evaluate chest pain. Chest pain can be a symptom of coronary artery disease (CAD), and cardiac catheterization can show whether plaque is narrowing or blocking your heart's arteries. This procedure is also used to evaluate the valves, as well as measure the blood flow and oxygen levels in different parts of your heart. For further information please visit [email or web address] Please follow instruction sheet, as given.    Follow-Up: At [facility], you and your health needs are our priority.  As part of our continuing mission to provide you with exceptional heart care, we have created designated Provider Care Teams.  These Care Teams include your primary Cardiologist (physician) and Advanced Practice Providers (APPs -  Physician Assistants and Nurse Practitioners) who all work together to provide you with the care you need, when you need it.  We recommend signing up for the patient portal called "MyChart".  Sign up information is provided on this After Visit Summary.  MyChart is used to connect with patients for Virtual Visits (Telemedicine).  Patients are able to view lab/test results, encounter notes, upcoming appointments, etc.  Non-urgent messages can be sent to your provider as well.   To learn more about what you can do with MyChart, go to [email or web address].    Your next appointment:   3-4 week(s)  The format for your next appointment:   In Person  Provider:   You may see Reatha Harps, MD or one of the following Advanced Practice Providers  on your designated Care Team:   Randall An, PA-C  Jacolyn Reedy, PA-C     Other Instructions Thank you for choosing [facility]!    [facility] [place] [address] [place] [address] Dept: [phone] Loc: [phone]  Heather [name]  10/19/2021  You are scheduled for a Cardiac Catheterization on Thursday, August 24 with Dr. Lance Muss.  1. Please arrive at the Main Entrance A at [facility]: [address] [place], [place] [address] at 7:00 AM (This time is two hours before your procedure to ensure your preparation). Free valet parking service is available.   Special note: Every effort is made to have your procedure done on time. Please understand that emergencies sometimes delay scheduled procedures.  2. Diet: Do not eat solid foods after midnight.  You may have clear liquids until 5 AM upon the day of the procedure.  3. Labs: You will need to have blood drawn on Wednesday, August 16 at [facility]. You do not need to be fasting.  4. Medication instructions in preparation for your procedure:   Contrast Allergy:  No  Stop Taking Hydralazine    On the morning of your procedure, take Aspirin 81 mg and any morning medicines NOT listed above.  You may use sips of water.  5. Plan to go home the same day, you will only stay overnight if medically necessary. 6. You MUST have a responsible adult to drive you home. 7. An adult MUST be with you the first 24 hours after you arrive home. 8. Bring a current list of your medications, and the last time and date medication taken. 9. Bring ID and current insurance cards. 10.Please wear clothes that are easy to get on and off and wear slip-on shoes.  Thank you for allowing [place] to care for you!   -- [facility]   Important Information About Sugar         Signed, Erma Heritage, PA-C   10/19/2021 5:32 PM    [place] [address] [place], [place] [address] Phone: [phone] Fax: [phone]

## 2021-10-19 NOTE — Progress Notes (Signed)
 Cardiology Office Note    Date:  10/19/2021   ID:  Heather [name], DOB 10/31/1940, MRN [id number]  PCP:  Pcp, No  Cardiologist: [place] T O'Neal, MD    Chief Complaint  Patient presents with   Hospitalization Follow-up    History of Present Illness:    Heather [name] is a 80 y.o. female with past medical history of HTN, LBBB, COPD and prior CVA who presents to the office today for hospital follow-up.  She most recently presented to [facility] ED on 10/07/2021 for evaluation of acute dyspnea which had started earlier in the day prior to arrival and was found to have hypertensive emergency with BP at 192/103. BNP was elevated to 1731 and CXR showed probable mild interstitial edema with a small left pleural effusion. Echocardiogram during admission showed her EF was reduced at 15 to 20% with global hypokinesis and mildly reduced RV function. PASP was moderately elevated at 49 mmHg. She did have mild to moderate mitral valve regurgitation. She responded well with IV Lasix and this was transitioned to 40 mg daily at the time of discharge. She did have frequent PVC's on telemetry and it was recommended she have a 2-week monitor to assess her PVC burden. While an ischemic cardiomyopathy was felt to be less likely, it was recommended to plan for an outpatient cardiac catheterization for definitive evaluation. She was continued on Bisoprolol, Farxiga and Imdur for cardiomyopathy as BP was not felt to allow for ACE-I/ARB at that time. However, Hydralazine was listed on her AVS as she had been on this prior to admission.   In talking with the patient today, she reports much improvement in her symptoms since her hospitalization but says she does not feel "100%".  She denies any specific orthopnea or PND. She does utilize compression stockings on a daily basis and had been doing so prior to her admission. No specific chest pain or palpitations. She has been following her blood pressure at home and this has  been soft with her systolic BP in the 80's to 90's at times.  Past Medical History:  Diagnosis Date   Anemia    Anxiety    Arthritis    COPD (chronic obstructive pulmonary disease) (HCC)    CVA (cerebral vascular accident) (HCC)    Depression    Dry eyes 04/14/2014   GERD (gastroesophageal reflux disease)    Glaucoma    Gout    HTN (hypertension)    OSA (obstructive sleep apnea)     Past Surgical History:  Procedure Laterality Date   ABDOMINAL HYSTERECTOMY     BACK SURGERY     lumbar-disc   BUNIONECTOMY Bilateral    FOOT SURGERY Left    repair of "kissing Cousins"   JOINT REPLACEMENT Bilateral    hip    JOINT REPLACEMENT Right 2010 or 2012   knee   KNEE SURGERY     SHOULDER ARTHROSCOPY Right    shoulder surgery in [place] about 2000   SHOULDER HEMI-ARTHROPLASTY Left 03/25/2014   Procedure: LEFT SHOULDER HEMI-ARTHROPLASTY;  Surgeon: Stanley E Harrison, MD;  Location: [facility];  Service: Orthopedics;  Laterality: Left;    Current Medications: Outpatient Medications Prior to Visit  Medication Sig Dispense Refill   allopurinol (ZYLOPRIM) 300 MG tablet Take 0.5 tablets (150 mg total) by mouth daily. 45 tablet 1   aspirin EC 81 MG tablet Take 1 tablet (81 mg total) by mouth daily. Swallow whole. 150 tablet 2   atorvastatin (LIPITOR)   10 MG tablet Take 1 tablet (10 mg total) by mouth daily. 30 tablet 2   bisoprolol (ZEBETA) 5 MG tablet Take 0.5 tablets (2.5 mg total) by mouth daily. 30 tablet 1   Budeson-Glycopyrrol-Formoterol (BREZTRI AEROSPHERE) 160-9-4.8 MCG/ACT AERO Inhale 2 puffs into the lungs 2 (two) times daily. (Patient taking differently: Inhale 2 puffs into the lungs daily.) 10.7 g 3   Cholecalciferol (DIALYVITE VITAMIN D 5000 PO) Take by mouth.     dapagliflozin propanediol (FARXIGA) 10 MG TABS tablet Take 1 tablet (10 mg total) by mouth daily. 30 tablet 2   famotidine (PEPCID) 20 MG tablet Take 1 tablet (20 mg total) by mouth at bedtime. One after supper 30 tablet 3    furosemide (LASIX) 40 MG tablet Take 1 tablet (40 mg total) by mouth daily. 30 tablet 2   HYDROcodone-acetaminophen (NORCO/VICODIN) 5-325 MG tablet Take 1 tablet by mouth every 6 (six) hours as needed for moderate pain.     isosorbide mononitrate (IMDUR) 30 MG 24 hr tablet Take 1 tablet (30 mg total) by mouth daily. 30 tablet 2   ketoconazole (NIZORAL) 2 % cream Apply 1 application topically daily. 60 g 2   PROAIR HFA 108 (90 Base) MCG/ACT inhaler Inhale 2 puffs into the lungs every 6 (six) hours as needed for wheezing or shortness of breath. 18 g 2   venlafaxine XR (EFFEXOR-XR) 37.5 MG 24 hr capsule Take 37.5 mg by mouth See admin instructions. Take 2 capsules by mouth daily     hydrALAZINE (APRESOLINE) 25 MG tablet Take 1 tablet (25 mg total) by mouth 2 (two) times daily. 60 tablet 2   zolpidem (AMBIEN) 10 MG tablet Take 1 tablet (10 mg total) by mouth at bedtime. 30 tablet 5   No facility-administered medications prior to visit.     Allergies:   Lovastatin and Lisinopril   Social History   Socioeconomic History   Marital status: Divorced    Spouse name: Not on file   Number of children: Not on file   Years of education: Not on file   Highest education level: Not on file  Occupational History   Not on file  Tobacco Use   Smoking status: Former    Packs/day: 1.00    Years: 40.00    Total pack years: 40.00    Types: Cigarettes    Quit date: 03/20/1985    Years since quitting: 36.6   Smokeless tobacco: Never  Substance and Sexual Activity   Alcohol use: No   Drug use: No   Sexual activity: Yes    Birth control/protection: Surgical  Other Topics Concern   Not on file  Social History Narrative   Heather [name] is a 81 year old divorced female who lives alone. She has support of her son and sister.    Social Determinants of Health   Financial Resource Strain: Low Risk  (06/30/2020)   Overall Financial Resource Strain (CARDIA)    Difficulty of Paying Living Expenses: Not hard at  all  Food Insecurity: No Food Insecurity (06/30/2020)   Hunger Vital Sign    Worried About Running Out of Food in the Last Year: Never true    Ran Out of Food in the Last Year: Never true  Transportation Needs: No Transportation Needs (06/30/2020)   PRAPARE - Transportation    Lack of Transportation (Medical): No    Lack of Transportation (Non-Medical): No  Physical Activity: Insufficiently Active (06/30/2020)   Exercise Vital Sign    Days of   Exercise per Week: 7 days    Minutes of Exercise per Session: 20 min  Stress: No Stress Concern Present (06/30/2020)   [employer] - Occupational Stress Questionnaire    Feeling of Stress : Not at all  Social Connections: Moderately Isolated (06/30/2020)   Social Connection and Isolation Panel [NHANES]    Frequency of Communication with Friends and Family: More than three times a week    Frequency of Social Gatherings with Friends and Family: Once a week    Attends Religious Services: More than 4 times per year    Active Member of Clubs or Organizations: No    Attends Club or Organization Meetings: Never    Marital Status: Widowed     Family History:  The patient's family history includes Aneurysm in her father; Congestive Heart Failure in her niece; Diabetes in her brother; High blood pressure in her mother; Hypertension in her niece; Pancreatitis in her brother.   Review of Systems:    Please see the history of present illness.     All other systems reviewed and are otherwise negative except as noted above.   Physical Exam:    VS:  BP 96/62   Pulse 80   Ht 5' 2.5" (1.588 m)   Wt 154 lb 9.6 oz (70.1 kg)   SpO2 94%   BMI 27.83 kg/m    General: Well developed, well nourished,female appearing in no acute distress. Head: Normocephalic, atraumatic. Neck: No carotid bruits. JVD not elevated.  Lungs: Respirations regular and unlabored, without wheezes or rales.  Heart: Regular rate and rhythm. No S3 or S4.  2/6  systolic murmur along Apex.  Abdomen: Appears non-distended. No obvious abdominal masses. Msk:  Strength and tone appear normal for age. No obvious joint deformities or effusions. Extremities: No clubbing or cyanosis. No edema.  Distal pedal pulses are 2+ bilaterally. Neuro: Alert and oriented X 3. Moves all extremities spontaneously. No focal deficits noted. Psych:  Responds to questions appropriately with a normal affect. Skin: No rashes or lesions noted  Wt Readings from Last 3 Encounters:  10/19/21 154 lb 9.6 oz (70.1 kg)  10/10/21 156 lb 8.4 oz (71 kg)  09/10/21 165 lb (74.8 kg)     Studies/Labs Reviewed:   EKG:  EKG is not ordered today.    Recent Labs: 10/07/2021: ALT 39; B Natriuretic Peptide 1,731.0; Magnesium 1.6; TSH 0.983 10/19/2021: BUN 43; Creatinine, Ser 1.76; Hemoglobin 12.5; Platelets 242; Potassium 4.1; Sodium 136   Lipid Panel    Component Value Date/Time   CHOL 196 12/14/2020 0120   CHOL 193 01/13/2020 1458   TRIG 77 12/14/2020 0120   HDL 89 12/14/2020 0120   HDL 91 01/13/2020 1458   CHOLHDL 2.2 12/14/2020 0120   VLDL 15 12/14/2020 0120   LDLCALC 92 12/14/2020 0120   LDLCALC 85 01/13/2020 1458    Additional studies/ records that were reviewed today include:   Echocardiogram: 10/07/2021 IMPRESSIONS     1. Left ventricular ejection fraction, by estimation, is 15-20%. The left  ventricle has severely decreased function. The left ventricle demonstrates  global hypokinesis. Indeterminate diastolic filling due to E-A fusion.   2. Right ventricular systolic function is mildly reduced. The right  ventricular size is mildly enlarged. There is moderately elevated  pulmonary artery systolic pressure. The estimated right ventricular  systolic pressure is 49.0 mmHg.   3. Left atrial size was moderately dilated.   4. The mitral valve is grossly normal. Mild to moderate mitral   valve  regurgitation. No evidence of mitral stenosis.   5. The aortic valve is  tricuspid. There is mild calcification of the  aortic valve. There is mild thickening of the aortic valve. Aortic valve  regurgitation is not visualized. Aortic valve sclerosis/calcification is  present, without any evidence of  aortic stenosis.   6. The inferior vena cava is normal in size with <50% respiratory  variability, suggesting right atrial pressure of 8 mmHg.   Comparison(s): Changes from prior study are noted. The left ventricular  function is worsened.   Assessment:    1. Acute systolic CHF (congestive heart failure) (HCC)   2. LBBB (left bundle branch block)   3. PVC's (premature ventricular contractions)   4. Essential hypertension   5. Stage 3a chronic kidney disease (HCC)      Plan:   In order of problems listed above:  1. HFrEF/Mitral Regurgitation - Recent echocardiogram showed her EF was reduced at 15 to 20% with global hypokinesis, mildly reduced RV function, PASP at 49 mmHg and mild to moderate mitral valve regurgitation. As discussed during her admission, a cardiac catheterization was recommended given her cardiomyopathy. Reviewed this with the patient today and she is in agreement to proceed. Will arrange for a R/LHC next week. Recheck CBC and BMET today.  - Will continue Bisoprolol 2.5 mg daily, Farxiga 10 mg daily, Lasix 40 mg daily and Imdur 30 mg daily for her cardiomyopathy. I did recommend stopping Hydralazine at this time given her hypotension. BP does not allow for ACE-I/ARB/ARNI/Spiro currently but would anticipate adding low-dose Spironolactone or Losartan over restarting Hydralazine if BP allows at subsequent visits.   ADDENDUM: Repeat labs show her creatinine has increased from 1.22 to 1.76. I called the patient and she will reduce Lasix from 40mg  daily to 20mg  daily. Will recheck a BMET next Monday prior to her procedure.   2. LBBB - Noted on prior tracings and will plan for ischemic evaluation as outlined above. If her EF remains reduced despite  optimization of medical therapy, she may be a candidate for CRT.  3. PVC's - She was noted to have frequent PVC's during her admission and a 14-day Zio patch is currently in place. Will follow-up on the results once available. Remains on Bisoprolol 2.5 mg daily.  4. HTN - Her BP is at 96/62 during today's visit and has been soft at home with systolic BP typically in the 80's to 90's. She is currently on Bisoprolol 2.5 mg daily, Farxiga 10 mg daily, Lasix 40 mg daily, Imdur 30 mg daily and Hydralazine 25 mg twice daily. I did recommend that she stop Hydralazine at this time.  5. Stage 3 CKD - Baseline creatinine 1.0 - 1.1. Will recheck a BMET today given recent medication adjustments and upcoming catheterization.   Shared Decision Making/Informed Consent:   Shared Decision Making/Informed Consent The risks [stroke (1 in 1000), death (1 in 1000), kidney failure [usually temporary] (1 in 500), bleeding (1 in 200), allergic reaction [possibly serious] (1 in 200)], benefits (diagnostic support and management of coronary artery disease) and alternatives of a cardiac catheterization were discussed in detail with Heather [name] and she is willing to proceed.    Medication Adjustments/Labs and Tests Ordered: Current medicines are reviewed at length with the patient today.  Concerns regarding medicines are outlined above.  Medication changes, Labs and Tests ordered today are listed in the Patient Instructions below. Patient Instructions  Medication Instructions:  Your physician has recommended you make the following  change in your medication:   Stop Taking Hydralazine   *If you need a refill on your cardiac medications before your next appointment, please call your pharmacy*   Lab Work: Your physician recommends that you return for lab work in: Today ( BMET, CBC)   If you have labs (blood work) drawn today and your tests are completely normal, you will receive your results only by: MyChart Message  (if you have MyChart) OR A paper copy in the mail If you have any lab test that is abnormal or we need to change your treatment, we will call you to review the results.   Testing/Procedures: Your physician has requested that you have a cardiac catheterization. Cardiac catheterization is used to diagnose and/or treat various heart conditions. Doctors may recommend this procedure for a number of different reasons. The most common reason is to evaluate chest pain. Chest pain can be a symptom of coronary artery disease (CAD), and cardiac catheterization can show whether plaque is narrowing or blocking your heart's arteries. This procedure is also used to evaluate the valves, as well as measure the blood flow and oxygen levels in different parts of your heart. For further information please visit [email or web address] Please follow instruction sheet, as given.    Follow-Up: At [facility], you and your health needs are our priority.  As part of our continuing mission to provide you with exceptional heart care, we have created designated Provider Care Teams.  These Care Teams include your primary Cardiologist (physician) and Advanced Practice Providers (APPs -  Physician Assistants and Nurse Practitioners) who all work together to provide you with the care you need, when you need it.  We recommend signing up for the patient portal called "MyChart".  Sign up information is provided on this After Visit Summary.  MyChart is used to connect with patients for Virtual Visits (Telemedicine).  Patients are able to view lab/test results, encounter notes, upcoming appointments, etc.  Non-urgent messages can be sent to your provider as well.   To learn more about what you can do with MyChart, go to [email or web address].    Your next appointment:   3-4 week(s)  The format for your next appointment:   In Person  Provider:   You may see Reatha Harps, MD or one of the following Advanced Practice Providers  on your designated Care Team:   Randall An, PA-C  Jacolyn Reedy, PA-C     Other Instructions Thank you for choosing [facility]!    [facility] [address] [place] [address] Dept: [phone] Loc: [phone]  JAMY WHYTE  10/19/2021  You are scheduled for a Cardiac Catheterization on Thursday, August 24 with Dr. Lance Muss.  1. Please arrive at the Main Entrance A at [facility]: [address] [place], [place] [address] at 7:00 AM (This time is two hours before your procedure to ensure your preparation). Free valet parking service is available.   Special note: Every effort is made to have your procedure done on time. Please understand that emergencies sometimes delay scheduled procedures.  2. Diet: Do not eat solid foods after midnight.  You may have clear liquids until 5 AM upon the day of the procedure.  3. Labs: You will need to have blood drawn on Wednesday, August 16 at [facility]. You do not need to be fasting.  4. Medication instructions in preparation for your procedure:   Contrast Allergy:  No  Stop Taking Hydralazine    On the morning of your procedure, take Aspirin 81 mg and any morning medicines NOT listed above.  You may use sips of water.  5. Plan to go home the same day, you will only stay overnight if medically necessary. 6. You MUST have a responsible adult to drive you home. 7. An adult MUST be with you the first 24 hours after you arrive home. 8. Bring a current list of your medications, and the last time and date medication taken. 9. Bring ID and current insurance cards. 10.Please wear clothes that are easy to get on and off and wear slip-on shoes.  Thank you for allowing us to care for you!   -- [facility]   Important Information About Sugar         Signed, Maicol Bowland M Kregg Cihlar, PA-C   10/19/2021 5:32 PM    [facility] [address] [place], [place] [address] Phone: [phone] Fax: [phone]   

## 2021-10-21 ENCOUNTER — Telehealth: Payer: Self-pay | Admitting: *Deleted

## 2021-10-21 ENCOUNTER — Telehealth: Payer: Self-pay | Admitting: Pulmonary Disease

## 2021-10-21 DIAGNOSIS — Z79899 Other long term (current) drug therapy: Secondary | ICD-10-CM

## 2021-10-21 MED ORDER — FUROSEMIDE 20 MG PO TABS: 20.0000 mg | ORAL_TABLET | Freq: Every day | ORAL | 3 refills | Status: DC

## 2021-10-21 NOTE — Telephone Encounter (Signed)
-----   Message from Ellsworth Lennox, [place] sent at 10/19/2021  4:48 PM EDT ----- I called the patient to review her results as hemoglobin and platelets are within a normal range but her kidney function is abnormal suggesting dehydration on her current medication regimen. I have asked the patient to reduce her Lasix from 40 mg daily to 20 mg daily and have a repeat BMET next Monday or Tuesday prior to her catheterization.   Bradly Bienenstock - Please update med list and pharmacy with correct dose of Lasix and enter orders for BMET.

## 2021-10-21 NOTE — Telephone Encounter (Signed)
Please let her know that if her CPAP is blowing cold air, then she can try increasing the temperature setting for her CPAP humidifier.  If she is still having trouble after this, then she would need to have her DME check the function of her machine.

## 2021-10-21 NOTE — Telephone Encounter (Signed)
-----   Message from Ellsworth Lennox, [place] sent at 10/20/2021  9:19 AM EDT ----- Regarding: CPAP Issues Good morning,   I think all of our mutual patients have been having CPAP issues. This patient mentioned yesterday she has not been able to use her CPAP routinely due to it blowing cold air and causing congestion. I was hoping someone from your office could reach out and help troubleshoot. I was encouraging her to be as complaint with it as possible given her newly diagnosed cardiomyopathy with EF at 15% (scheduled for cardiac cath next week).   Thanks for your help!  Best,  [place]

## 2021-10-24 ENCOUNTER — Telehealth: Payer: Self-pay

## 2021-10-24 ENCOUNTER — Other Ambulatory Visit (HOSPITAL_COMMUNITY)
Admission: RE | Admit: 2021-10-24 | Discharge: 2021-10-24 | Disposition: A | Discharge status: Home / Self Care | Payer: Medicare HMO | Source: Ambulatory Visit | Attending: Student | Admitting: Student

## 2021-10-24 DIAGNOSIS — Z79899 Other long term (current) drug therapy: Secondary | ICD-10-CM | POA: Diagnosis present

## 2021-10-24 MED ORDER — FUROSEMIDE 20 MG PO TABS: 20.0000 mg | ORAL_TABLET | Freq: Every day | ORAL | 3 refills | Status: DC

## 2021-10-24 NOTE — Telephone Encounter (Signed)
Called and notified patient of recommendations and she voiced understanding. She states her CPAP machine has been louder than usual lately and it is waking her up. Advised her to contact [employer] to make sure that her cpap is functioning properly. She voiced understanding and states she will reach out and will call us back for any further concerns/ if she is still having trouble with CPAP. Nothing further needed at this time.

## 2021-10-24 NOTE — Telephone Encounter (Signed)
-----   Message from Dyann Kief, PA-C sent at 10/24/2021  1:24 PM EDT ----- Kidney function has improved some but still up. Ask her to hold lasix until after cath this week. Repeat bmet when she arrives in short stay. Copying Thurston Hole

## 2021-10-24 NOTE — Telephone Encounter (Signed)
-----   Message from Dyann Kief, PA-C sent at 10/24/2021  2:57 PM EDT ----- I've never seen this patient and am covering for [place] but with a low EF I wouldn't hydrate. I stopped her lasix completely which should help. Thanks ----- Message ----- From: Jacqlyn Krauss, RN Sent: 10/24/2021   2:47 PM EDT To: Jacqlyn Krauss, RN; Dyann Kief, PA-C; #  Elon Jester,  10/24/21 GFR 37-cath lab guideline would be to arrange 4 hours pre-procedure hydration if GFR<45. 10/07/21-Echo EF 15-20%  Would you recommend pre-procedure hydration given EF 15-20%?

## 2021-10-24 NOTE — Telephone Encounter (Signed)
Spoke with patient and she will stop Lasix all together.

## 2021-10-24 NOTE — Telephone Encounter (Signed)
Spoke with patient and she agrees to Hold Lasix until heart cath on 10/27/21

## 2021-10-25 ENCOUNTER — Telehealth: Payer: Self-pay | Admitting: *Deleted

## 2021-10-25 NOTE — Telephone Encounter (Signed)
Cardiac Catheterization scheduled at [facility] for: Thursday October 27, 2021 9 AM Arrival time and place: [facility] Main Entrance A at: 6:30 AM-needs BMP   Nothing to eat after midnight prior to procedure, clear liquids until 5 AM day of procedure.  Medication instructions: -Hold:  Farxiga-AM of procedure.  I confirmed with patient that she stopped taking lasix 10/25/21.  -Except hold medications usual morning medications can be taken with sips of water including aspirin 81 mg.    Confirmed patient has responsible adult to drive home post procedure and be with patient first 24 hours after arriving home.  Patient reports no new symptoms concerning for COVID-19 in the past 10 days.  Reviewed procedure instructions with patient.

## 2021-10-27 ENCOUNTER — Encounter (HOSPITAL_COMMUNITY)
Admission: RE | Disposition: A | Discharge status: Home / Self Care | Payer: Self-pay | Source: Home / Self Care | Attending: Interventional Cardiology

## 2021-10-27 ENCOUNTER — Ambulatory Visit (HOSPITAL_COMMUNITY)
Admission: RE | Admit: 2021-10-27 | Discharge: 2021-10-27 | Disposition: A | Discharge status: Home / Self Care | Payer: Medicare HMO | Attending: Interventional Cardiology | Admitting: Interventional Cardiology

## 2021-10-27 ENCOUNTER — Other Ambulatory Visit: Payer: Self-pay

## 2021-10-27 DIAGNOSIS — I130 Hypertensive heart and chronic kidney disease with heart failure and stage 1 through stage 4 chronic kidney disease, or unspecified chronic kidney disease: Secondary | ICD-10-CM | POA: Insufficient documentation

## 2021-10-27 DIAGNOSIS — I5021 Acute systolic (congestive) heart failure: Secondary | ICD-10-CM | POA: Diagnosis present

## 2021-10-27 DIAGNOSIS — I493 Ventricular premature depolarization: Secondary | ICD-10-CM | POA: Diagnosis not present

## 2021-10-27 DIAGNOSIS — I340 Nonrheumatic mitral (valve) insufficiency: Secondary | ICD-10-CM | POA: Insufficient documentation

## 2021-10-27 DIAGNOSIS — I2510 Atherosclerotic heart disease of native coronary artery without angina pectoris: Secondary | ICD-10-CM | POA: Insufficient documentation

## 2021-10-27 DIAGNOSIS — N1831 Chronic kidney disease, stage 3a: Secondary | ICD-10-CM | POA: Diagnosis not present

## 2021-10-27 DIAGNOSIS — Z87891 Personal history of nicotine dependence: Secondary | ICD-10-CM | POA: Diagnosis not present

## 2021-10-27 DIAGNOSIS — Z79899 Other long term (current) drug therapy: Secondary | ICD-10-CM | POA: Insufficient documentation

## 2021-10-27 DIAGNOSIS — J449 Chronic obstructive pulmonary disease, unspecified: Secondary | ICD-10-CM | POA: Insufficient documentation

## 2021-10-27 DIAGNOSIS — I428 Other cardiomyopathies: Secondary | ICD-10-CM | POA: Diagnosis not present

## 2021-10-27 DIAGNOSIS — Z8673 Personal history of transient ischemic attack (TIA), and cerebral infarction without residual deficits: Secondary | ICD-10-CM | POA: Diagnosis not present

## 2021-10-27 DIAGNOSIS — Z01812 Encounter for preprocedural laboratory examination: Secondary | ICD-10-CM

## 2021-10-27 DIAGNOSIS — I272 Pulmonary hypertension, unspecified: Secondary | ICD-10-CM | POA: Diagnosis not present

## 2021-10-27 DIAGNOSIS — I447 Left bundle-branch block, unspecified: Secondary | ICD-10-CM | POA: Insufficient documentation

## 2021-10-27 HISTORY — PX: RIGHT/LEFT HEART CATH AND CORONARY ANGIOGRAPHY: CATH118266

## 2021-10-27 LAB — POCT I-STAT 7, (LYTES, BLD GAS, ICA,H+H)
Acid-base deficit: 1 mmol/L (ref 0.0–2.0)
Bicarbonate: 23.6 mmol/L (ref 20.0–28.0)
Calcium, Ion: 1.24 mmol/L (ref 1.15–1.40)
HCT: 34 % — ABNORMAL LOW (ref 36.0–46.0)
Hemoglobin: 11.6 g/dL — ABNORMAL LOW (ref 12.0–15.0)
O2 Saturation: 93 %
Potassium: 4.1 mmol/L (ref 3.5–5.1)
Sodium: 141 mmol/L (ref 135–145)
TCO2: 25 mmol/L (ref 22–32)
pCO2 arterial: 39.8 mmHg (ref 32–48)
pH, Arterial: 7.382 (ref 7.35–7.45)
pO2, Arterial: 70 mmHg — ABNORMAL LOW (ref 83–108)

## 2021-10-27 LAB — POCT I-STAT EG7
Acid-base deficit: 1 mmol/L (ref 0.0–2.0)
Bicarbonate: 25.5 mmol/L (ref 20.0–28.0)
Calcium, Ion: 1.28 mmol/L (ref 1.15–1.40)
HCT: 35 % — ABNORMAL LOW (ref 36.0–46.0)
Hemoglobin: 11.9 g/dL — ABNORMAL LOW (ref 12.0–15.0)
O2 Saturation: 58 %
Potassium: 4.2 mmol/L (ref 3.5–5.1)
Sodium: 141 mmol/L (ref 135–145)
TCO2: 27 mmol/L (ref 22–32)
pCO2, Ven: 47.1 mmHg (ref 44–60)
pH, Ven: 7.343 (ref 7.25–7.43)
pO2, Ven: 33 mmHg (ref 32–45)

## 2021-10-27 LAB — BASIC METABOLIC PANEL
Anion gap: 8 (ref 5–15)
BUN: 30 mg/dL — ABNORMAL HIGH (ref 8–23)
CO2: 25 mmol/L (ref 22–32)
Calcium: 9.4 mg/dL (ref 8.9–10.3)
Chloride: 109 mmol/L (ref 98–111)
Creatinine, Ser: 1.46 mg/dL — ABNORMAL HIGH (ref 0.44–1.00)
GFR, Estimated: 36 mL/min — ABNORMAL LOW (ref >60)
Glucose, Bld: 96 mg/dL (ref 70–99)
Potassium: 5 mmol/L (ref 3.5–5.1)
Sodium: 142 mmol/L (ref 135–145)

## 2021-10-27 SURGERY — RIGHT/LEFT HEART CATH AND CORONARY ANGIOGRAPHY
Anesthesia: LOCAL

## 2021-10-27 MED ORDER — MIDAZOLAM HCL 2 MG/2ML IJ SOLN
INTRAMUSCULAR | Status: DC | PRN
  Administered 2021-10-27: 1 mg via INTRAVENOUS

## 2021-10-27 MED ORDER — ACETAMINOPHEN 325 MG PO TABS: 650.0000 mg | ORAL_TABLET | ORAL | Status: DC | PRN

## 2021-10-27 MED ORDER — SODIUM CHLORIDE 0.9% FLUSH: 3.0000 mL | Freq: Two times a day (BID) | INTRAVENOUS | Status: DC

## 2021-10-27 MED ORDER — ASPIRIN 81 MG PO CHEW: 81.0000 mg | CHEWABLE_TABLET | ORAL | Status: DC

## 2021-10-27 MED ORDER — SODIUM CHLORIDE 0.9 % IV SOLN: 250.0000 mL | INTRAVENOUS | Status: DC | PRN

## 2021-10-27 MED ORDER — VERAPAMIL HCL 2.5 MG/ML IV SOLN
INTRAVENOUS | Status: AC
  Filled 2021-10-27: qty 2

## 2021-10-27 MED ORDER — SODIUM CHLORIDE 0.9% FLUSH: 3.0000 mL | INTRAVENOUS | Status: DC | PRN

## 2021-10-27 MED ORDER — SODIUM CHLORIDE 0.9% FLUSH
3.0000 mL | INTRAVENOUS | Status: DC | PRN
Start: 2021-10-27 — End: 2021-10-27

## 2021-10-27 MED ORDER — LIDOCAINE HCL (PF) 1 % IJ SOLN
INTRAMUSCULAR | Status: DC | PRN
  Administered 2021-10-27 (×2): 2 mL

## 2021-10-27 MED ORDER — SODIUM CHLORIDE 0.9 % IV SOLN: INTRAVENOUS | Status: DC

## 2021-10-27 MED ORDER — LIDOCAINE HCL (PF) 1 % IJ SOLN
INTRAMUSCULAR | Status: AC
  Filled 2021-10-27: qty 30

## 2021-10-27 MED ORDER — VERAPAMIL HCL 2.5 MG/ML IV SOLN
INTRAVENOUS | Status: DC | PRN
  Administered 2021-10-27 (×2): 10 mL via INTRA_ARTERIAL

## 2021-10-27 MED ORDER — ONDANSETRON HCL 4 MG/2ML IJ SOLN: 4.0000 mg | Freq: Four times a day (QID) | INTRAMUSCULAR | Status: DC | PRN

## 2021-10-27 MED ORDER — HEPARIN (PORCINE) IN NACL 1000-0.9 UT/500ML-% IV SOLN
INTRAVENOUS | Status: DC | PRN
  Administered 2021-10-27 (×2): 500 mL

## 2021-10-27 MED ORDER — MIDAZOLAM HCL 2 MG/2ML IJ SOLN
INTRAMUSCULAR | Status: AC
  Filled 2021-10-27: qty 2

## 2021-10-27 MED ORDER — HYDRALAZINE HCL 20 MG/ML IJ SOLN: 10.0000 mg | INTRAMUSCULAR | Status: DC | PRN

## 2021-10-27 MED ORDER — FENTANYL CITRATE (PF) 100 MCG/2ML IJ SOLN
INTRAMUSCULAR | Status: DC | PRN
Start: 2021-10-27 — End: 2021-10-27
  Administered 2021-10-27: 25 ug via INTRAVENOUS

## 2021-10-27 MED ORDER — HEPARIN SODIUM (PORCINE) 1000 UNIT/ML IJ SOLN
INTRAMUSCULAR | Status: AC
  Filled 2021-10-27: qty 10

## 2021-10-27 MED ORDER — HEPARIN SODIUM (PORCINE) 1000 UNIT/ML IJ SOLN
INTRAMUSCULAR | Status: DC | PRN
  Administered 2021-10-27: 3500 [IU] via INTRAVENOUS

## 2021-10-27 MED ORDER — LABETALOL HCL 5 MG/ML IV SOLN: 10.0000 mg | INTRAVENOUS | Status: DC | PRN

## 2021-10-27 MED ORDER — FENTANYL CITRATE (PF) 100 MCG/2ML IJ SOLN
INTRAMUSCULAR | Status: AC
  Filled 2021-10-27: qty 2

## 2021-10-27 MED ORDER — IOHEXOL 350 MG/ML SOLN
INTRAVENOUS | Status: DC | PRN
  Administered 2021-10-27: 30 mL via INTRA_ARTERIAL

## 2021-10-27 SURGICAL SUPPLY — 13 items
BAND ZEPHYR COMPRESS 30 LONG (HEMOSTASIS) IMPLANT
CATH 5FR JL3.5 JR4 ANG PIG MP (CATHETERS) IMPLANT
CATH BALLN WEDGE 5F 110CM (CATHETERS) IMPLANT
GLIDESHEATH SLEND SS 6F .021 (SHEATH) IMPLANT
GUIDEWIRE .025 260CM (WIRE) IMPLANT
GUIDEWIRE INQWIRE 1.5J.035X260 (WIRE) IMPLANT
INQWIRE 1.5J .035X260CM (WIRE) ×1 IMPLANT
KIT HEART LEFT (KITS) ×1 IMPLANT
PACK CARDIAC CATHETERIZATION (CUSTOM PROCEDURE TRAY) ×1 IMPLANT
SHEATH GLIDE SLENDER 4/5FR (SHEATH) IMPLANT
SHEATH PROBE COVER 6X72 (BAG) IMPLANT
TRANSDUCER W/STOPCOCK (MISCELLANEOUS) ×1 IMPLANT
TUBING CIL FLEX 10 FLL-RA (TUBING) ×1 IMPLANT

## 2021-10-27 NOTE — Interval H&P Note (Signed)
Cath Lab Visit (complete for each Cath Lab visit)  Clinical Evaluation Leading to the Procedure:   ACS: No.  Non-ACS:    Anginal Classification: CCS III  Anti-ischemic medical therapy: Minimal Therapy (1 class of medications)  Non-Invasive Test Results: High-risk stress test findings: cardiac mortality >3%/year  Prior CABG: No previous CABG   Low EF   History and Physical Interval Note:  10/27/2021 8:37 AM  Heather [name]  has presented today for surgery, with the diagnosis of acute chf.  The various methods of treatment have been discussed with the patient and family. After consideration of risks, benefits and other options for treatment, the patient has consented to  Procedure(s): RIGHT/LEFT HEART CATH AND CORONARY ANGIOGRAPHY (N/A) as a surgical intervention.  The patient's history has been reviewed, patient examined, no change in status, stable for surgery.  I have reviewed the patient's chart and labs.  Questions were answered to the patient's satisfaction.     Lance Muss

## 2021-10-28 ENCOUNTER — Encounter (HOSPITAL_COMMUNITY): Payer: Self-pay | Admitting: Interventional Cardiology

## 2021-10-28 ENCOUNTER — Telehealth: Payer: Self-pay

## 2021-10-28 NOTE — Telephone Encounter (Signed)
Patient notified and verbalized understanding. Pt stated that she does feel a racing hr now and then. Pt stated that she consumes 50% of her liquids as caffeine. Patient had no other questions or concerns at this time.

## 2021-10-28 NOTE — Telephone Encounter (Signed)
-----   Message from Dyann Kief, [place] sent at 10/27/2021  7:33 AM EDT ----- Monitor showed rare PVC's and brief runs SVT-fast heart beats from top of heart. Ask her if she is feeling these? Her BP was too low to increase bisoprolol. Can discuss further at OV next month. Ask her to monitor BP at home and bring readings in with her. thanks

## 2021-11-03 ENCOUNTER — Ambulatory Visit (INDEPENDENT_AMBULATORY_CARE_PROVIDER_SITE_OTHER): Payer: Medicare HMO | Admitting: Internal Medicine

## 2021-11-03 ENCOUNTER — Encounter: Payer: Self-pay | Admitting: Internal Medicine

## 2021-11-03 VITALS — BP 120/74 | HR 82 | Ht 63.5 in | Wt 152.6 lb

## 2021-11-03 DIAGNOSIS — E785 Hyperlipidemia, unspecified: Secondary | ICD-10-CM

## 2021-11-03 DIAGNOSIS — N1831 Chronic kidney disease, stage 3a: Secondary | ICD-10-CM

## 2021-11-03 DIAGNOSIS — Z9989 Dependence on other enabling machines and devices: Secondary | ICD-10-CM

## 2021-11-03 DIAGNOSIS — J449 Chronic obstructive pulmonary disease, unspecified: Secondary | ICD-10-CM

## 2021-11-03 DIAGNOSIS — M1A9XX0 Chronic gout, unspecified, without tophus (tophi): Secondary | ICD-10-CM | POA: Diagnosis not present

## 2021-11-03 DIAGNOSIS — Z0000 Encounter for general adult medical examination without abnormal findings: Secondary | ICD-10-CM

## 2021-11-03 DIAGNOSIS — I5021 Acute systolic (congestive) heart failure: Secondary | ICD-10-CM

## 2021-11-03 DIAGNOSIS — Z8673 Personal history of transient ischemic attack (TIA), and cerebral infarction without residual deficits: Secondary | ICD-10-CM

## 2021-11-03 DIAGNOSIS — I10 Essential (primary) hypertension: Secondary | ICD-10-CM

## 2021-11-03 DIAGNOSIS — G4733 Obstructive sleep apnea (adult) (pediatric): Secondary | ICD-10-CM

## 2021-11-03 MED ORDER — ATORVASTATIN CALCIUM 10 MG PO TABS: 10.0000 mg | ORAL_TABLET | Freq: Every day | ORAL | 2 refills | Status: DC

## 2021-11-03 MED ORDER — ALLOPURINOL 300 MG PO TABS: 150.0000 mg | ORAL_TABLET | Freq: Every day | ORAL | 1 refills | Status: DC

## 2021-11-03 NOTE — Progress Notes (Signed)
New Patient Office Visit  Subjective    Patient ID: Heather [name], female    DOB: Jun 22, 1940  Age: 81 y.o. MRN: [id number]  CC:  Chief Complaint  Patient presents with   Establish Care   HPI VICOTRIA [name] presents to establish care.  She is a 81 year old woman with a past medical history significant for HFrEF, gout, HTN, COPD, prior CVA, CKD 3A, mild-moderate pulmonary hypertension, and mild OSA.  She was previously followed at [facility] family medicine in [place]  Last seen in March 2023.  She is also followed by cardiology and pulmonology.  Ms. Gram reports feeling well today.  She is asymptomatic and denies acute concerns aside from requesting medication refills.  Chronic medical issues and outstanding preventative healthcare maintenance items discussed today are individually addressed in A/P below.  Outpatient Encounter Medications as of 11/03/2021  Medication Sig   aspirin EC 81 MG tablet Take 1 tablet (81 mg total) by mouth daily. Swallow whole.   bisoprolol (ZEBETA) 5 MG tablet Take 0.5 tablets (2.5 mg total) by mouth daily.   Budeson-Glycopyrrol-Formoterol (BREZTRI AEROSPHERE) 160-9-4.8 MCG/ACT AERO Inhale 2 puffs into the lungs 2 (two) times daily. (Patient taking differently: Inhale 2 puffs into the lungs daily.)   chlorpheniramine (CHLOR-TRIMETON) 4 MG tablet Take 4 mg by mouth 2 (two) times daily as needed for allergies.   Cholecalciferol (DIALYVITE VITAMIN D 5000 PO) Take by mouth.   dapagliflozin propanediol (FARXIGA) 10 MG TABS tablet Take 1 tablet (10 mg total) by mouth daily.   famotidine (PEPCID) 20 MG tablet Take 1 tablet (20 mg total) by mouth at bedtime. One after supper   furosemide (LASIX) 40 MG tablet Take 40 mg by mouth daily.   hydrALAZINE (APRESOLINE) 25 MG tablet Take 25 mg by mouth in the morning and at bedtime.   HYDROcodone-acetaminophen (NORCO/VICODIN) 5-325 MG tablet Take 1 tablet by mouth every 6 (six) hours as needed for moderate pain.   isosorbide mononitrate  (IMDUR) 30 MG 24 hr tablet Take 1 tablet (30 mg total) by mouth daily.   ketoconazole (NIZORAL) 2 % cream Apply 1 application topically daily.   Multiple Vitamins-Minerals (MULTIVITAMIN WITH MINERALS) tablet Take 1 tablet by mouth daily.   PROAIR HFA 108 (90 Base) MCG/ACT inhaler Inhale 2 puffs into the lungs every 6 (six) hours as needed for wheezing or shortness of breath.   venlafaxine XR (EFFEXOR-XR) 37.5 MG 24 hr capsule Take 37.5 mg by mouth See admin instructions. Take 2 capsules by mouth daily   zolpidem (AMBIEN) 10 MG tablet Take 1 tablet (10 mg total) by mouth at bedtime.   [DISCONTINUED] atorvastatin (LIPITOR) 10 MG tablet Take 1 tablet (10 mg total) by mouth daily.   allopurinol (ZYLOPRIM) 300 MG tablet Take 0.5 tablets (150 mg total) by mouth daily.   atorvastatin (LIPITOR) 10 MG tablet Take 1 tablet (10 mg total) by mouth daily.   [DISCONTINUED] allopurinol (ZYLOPRIM) 300 MG tablet Take 0.5 tablets (150 mg total) by mouth daily. (Patient not taking: Reported on 11/03/2021)   No facility-administered encounter medications on file as of 11/03/2021.   Past Medical History:  Diagnosis Date   Anemia    Anxiety    Arthritis    COPD (chronic obstructive pulmonary disease) (HCC)    CVA (cerebral vascular accident) (HCC)    Depression    Dry eyes 04/14/2014   GERD (gastroesophageal reflux disease)    Glaucoma    Gout    HTN (hypertension)    OSA (  obstructive sleep apnea)    Past Surgical History:  Procedure Laterality Date   ABDOMINAL HYSTERECTOMY     BACK SURGERY     lumbar-disc   BUNIONECTOMY Bilateral    FOOT SURGERY Left    repair of "kissing Cousins"   JOINT REPLACEMENT Bilateral    hip    JOINT REPLACEMENT Right 2010 or 2012   knee   KNEE SURGERY     RIGHT/LEFT HEART CATH AND CORONARY ANGIOGRAPHY N/A 10/27/2021   Procedure: RIGHT/LEFT HEART CATH AND CORONARY ANGIOGRAPHY;  Surgeon: Corky Crafts, MD;  Location: [facility] INVASIVE CV LAB;  Service: Cardiovascular;   Laterality: N/A;   SHOULDER ARTHROSCOPY Right    shoulder surgery in [place] about 2000   SHOULDER HEMI-ARTHROPLASTY Left 03/25/2014   Procedure: LEFT SHOULDER HEMI-ARTHROPLASTY;  Surgeon: Vickki Hearing, MD;  Location: [facility];  Service: Orthopedics;  Laterality: Left;   Family History  Problem Relation Age of Onset   High blood pressure Mother    Aneurysm Father    Diabetes Brother    Pancreatitis Brother    Hypertension Niece    Congestive Heart Failure Niece    Social History   Socioeconomic History   Marital status: Divorced    Spouse name: Not on file   Number of children: Not on file   Years of education: Not on file   Highest education level: Not on file  Occupational History   Not on file  Tobacco Use   Smoking status: Former    Packs/day: 1.00    Years: 40.00    Total pack years: 40.00    Types: Cigarettes    Quit date: 03/20/1985    Years since quitting: 36.6   Smokeless tobacco: Never  Substance and Sexual Activity   Alcohol use: No   Drug use: No   Sexual activity: Yes    Birth control/protection: Surgical  Other Topics Concern   Not on file  Social History Narrative   [name] is a 81 year old divorced female who lives alone. She has support of her son and sister.    Social Determinants of Health   Financial Resource Strain: Low Risk  (06/30/2020)   Overall Financial Resource Strain (CARDIA)    Difficulty of Paying Living Expenses: Not hard at all  Food Insecurity: No Food Insecurity (06/30/2020)   Hunger Vital Sign    Worried About Running Out of Food in the Last Year: Never true    Ran Out of Food in the Last Year: Never true  Transportation Needs: No Transportation Needs (06/30/2020)   PRAPARE - Administrator, Civil Service (Medical): No    Lack of Transportation (Non-Medical): No  Physical Activity: Insufficiently Active (06/30/2020)   Exercise Vital Sign    Days of Exercise per Week: 7 days    Minutes of Exercise per Session: 20  min  Stress: No Stress Concern Present (06/30/2020)   [employer] of Occupational Health - Occupational Stress Questionnaire    Feeling of Stress : Not at all  Social Connections: Moderately Isolated (06/30/2020)   Social Connection and Isolation Panel [NHANES]    Frequency of Communication with Friends and Family: More than three times a week    Frequency of Social Gatherings with Friends and Family: Once a week    Attends Religious Services: More than 4 times per year    Active Member of [employer] or Organizations: No    Attends Banker Meetings: Never    Marital  Status: Widowed  Intimate Partner Violence: Not At Risk (06/30/2020)   Humiliation, Afraid, Rape, and Kick questionnaire    Fear of Current or Ex-Partner: No    Emotionally Abused: No    Physically Abused: No    Sexually Abused: No   Review of Systems  Constitutional:  Negative for chills and fever.  HENT:  Negative for sore throat.   Respiratory:  Negative for cough and shortness of breath.   Cardiovascular:  Negative for chest pain, palpitations and leg swelling.  Gastrointestinal:  Negative for abdominal pain, blood in stool, constipation, diarrhea, nausea and vomiting.  Genitourinary:  Negative for dysuria and hematuria.  Musculoskeletal:  Negative for myalgias.  Skin:  Negative for itching and rash.  Neurological:  Negative for dizziness and headaches.  Psychiatric/Behavioral:  Negative for depression and suicidal ideas.    Objective    BP 120/74   Pulse 82   Ht 5' 3.5" (1.613 m)   Wt 152 lb 9.6 oz (69.2 kg)   SpO2 99%   BMI 26.61 kg/m   Physical Exam Vitals reviewed.  Constitutional:      Appearance: Normal appearance.  HENT:     Head: Normocephalic and atraumatic.     Right Ear: External ear normal.     Left Ear: External ear normal.     Nose: Nose normal. No congestion or rhinorrhea.     Mouth/Throat:     Mouth: Mucous membranes are moist.     Pharynx: Oropharynx is clear. No  oropharyngeal exudate or posterior oropharyngeal erythema.  Eyes:     General: No scleral icterus.    Extraocular Movements: Extraocular movements intact.     Conjunctiva/sclera: Conjunctivae normal.     Pupils: Pupils are equal, round, and reactive to light.  Cardiovascular:     Rate and Rhythm: Normal rate and regular rhythm.     Pulses: Normal pulses.     Heart sounds: Murmur heard.  Pulmonary:     Effort: Pulmonary effort is normal.     Breath sounds: Normal breath sounds. No wheezing or rhonchi.  Abdominal:     General: Abdomen is flat. Bowel sounds are normal. There is no distension.     Palpations: Abdomen is soft.     Tenderness: There is no abdominal tenderness.  Musculoskeletal:        General: Normal range of motion.     Right lower leg: Edema present.     Left lower leg: Edema present.     Comments: Non-pitting bilateral lower extremity edema, compression stockings in place  Skin:    General: Skin is warm and dry.     Capillary Refill: Capillary refill takes less than 2 seconds.     Coloration: Skin is not jaundiced.  Neurological:     General: No focal deficit present.     Mental Status: She is alert and oriented to person, place, and time.     Motor: No weakness.  Psychiatric:        Mood and Affect: Mood normal.        Behavior: Behavior normal.    Last CBC Lab Results  Component Value Date   WBC 4.8 10/19/2021   HGB 11.9 (L) 10/27/2021   HCT 35.0 (L) 10/27/2021   MCV 94.0 10/19/2021   MCH 29.8 10/19/2021   RDW 15.1 10/19/2021   PLT 242 10/19/2021   Last metabolic panel Lab Results  Component Value Date   GLUCOSE 96 10/27/2021   NA 141 10/27/2021  K 4.2 10/27/2021   CL 109 10/27/2021   CO2 25 10/27/2021   BUN 30 (H) 10/27/2021   CREATININE 1.46 (H) 10/27/2021   GFRNONAA 36 (L) 10/27/2021   CALCIUM 9.4 10/27/2021   PHOS 3.3 10/07/2021   PROT 7.3 10/07/2021   ALBUMIN 4.3 10/07/2021   LABGLOB 2.2 01/13/2020   AGRATIO 2.1 01/13/2020   BILITOT  1.7 (H) 10/07/2021   ALKPHOS 76 10/07/2021   AST 66 (H) 10/07/2021   ALT 39 10/07/2021   ANIONGAP 8 10/27/2021   Last lipids Lab Results  Component Value Date   CHOL 196 12/14/2020   HDL 89 12/14/2020   LDLCALC 92 12/14/2020   TRIG 77 12/14/2020   CHOLHDL 2.2 12/14/2020   Last hemoglobin A1c Lab Results  Component Value Date   HGBA1C 5.8 (H) 12/13/2020   Last thyroid functions Lab Results  Component Value Date   TSH 0.983 10/07/2021   Last vitamin D Lab Results  Component Value Date   VD25OH 65.1 07/07/2021   Last vitamin B12 and Folate Lab Results  Component Value Date   VITAMINB12 WILL FOLLOW 11/03/2021   FOLATE WILL FOLLOW 11/03/2021     Assessment & Plan:   Problem List Items Addressed This Visit       Cardiovascular and Mediastinum   Essential hypertension    BP 120/74 today.  Well-controlled on current regimen of bisoprolol, hydralazine, and Imdur. -No changes today      Acute systolic CHF (congestive heart failure) (HCC)    Recent admission for heart failure exacerbation earlier this month.  Repeat TTE showing EF 15-20%, which is significantly decreased from prior echo in October 2022.  She underwent RHC/LHC 8/24 demonstrating mild, nonobstructive CAD and mild-moderate pulmonary hypertension.  Asymptomatic today and appears euvolemic on exam.  She is currently prescribed bisoprolol, Farxiga, and Lasix 40 mg daily. -No changes today. -Cardiology follow-up scheduled for 9/25 -Will update iron studies to ensure there is no concomitant iron deficiency with HFrEF        Respiratory   COPD GOLD 1     Asymptomatic currently.  Unremarkable pulmonary exam.  She is taking Dulera daily and albuterol as needed, and states she has not needed to use her albuterol inhaler recently.      Relevant Medications   chlorpheniramine (CHLOR-TRIMETON) 4 MG tablet   OSA on CPAP    History of mild OSA.  Currently using CPAP.        Nervous and Auditory   Cerebral  infarction, remote, resolved    Multiple prior records reported history of CVA.  Per review of documentation, it appears she presented to the ED in October 2022 with global weakness, dysarthria, and ataxia.  Imaging revealed a remote infarct without acute findings.  Her symptoms were attributed to acute illness vs acute infarct.  ASA 81 mg was recommended.  No residual symptoms appreciated on exam today. -Continue ASA 81 mg and atorvastatin 10 mg daily        Genitourinary   Stage 3a chronic kidney disease (HCC)    Renal function at baseline on most recent labs.        Other   Gout    Requesting refill of allopurinol today.  Not currently in flare.      Relevant Medications   allopurinol (ZYLOPRIM) 300 MG tablet   Preventative health care - Primary    Presenting today to establish care. -Recent labs reviewed -One-time screening for HCV completed today -States that she has had  her screening mammogram completed within the last year.  We will update our records. -Also states she has previously received Shingrix vaccination.  We will update her records. -Plans to receive COVID booster at her pharmacy -Will return for influenza vaccine next month      Return in about 3 months (around 02/02/2022).   [name], MD

## 2021-11-03 NOTE — Patient Instructions (Addendum)
It was a pleasure to see you today.  Thank you for giving [place] the opportunity to be involved in your care.  Below is a brief recap of your visit and next steps.  We will plan to see you again in 3 months.  Summary I have refilled allopurinol and atorvastatin I'm checking iron levels, B12/folate levels, and one time screening for hepatitis c   Next steps I will 3 months You have follow up with cardiology, podiatry, and pulmonology in the next two months. Please let us know if you need anything.

## 2021-11-04 ENCOUNTER — Ambulatory Visit: Payer: Medicare HMO | Admitting: Internal Medicine

## 2021-11-04 DIAGNOSIS — G4733 Obstructive sleep apnea (adult) (pediatric): Secondary | ICD-10-CM | POA: Insufficient documentation

## 2021-11-04 DIAGNOSIS — Z8673 Personal history of transient ischemic attack (TIA), and cerebral infarction without residual deficits: Secondary | ICD-10-CM | POA: Insufficient documentation

## 2021-11-04 NOTE — Assessment & Plan Note (Signed)
Requesting refill of allopurinol today.  Not currently in flare.

## 2021-11-04 NOTE — Assessment & Plan Note (Addendum)
Recent admission for heart failure exacerbation earlier this month.  Repeat TTE showing EF 15-20%, which is significantly decreased from prior echo in October 2022.  She underwent RHC/LHC 8/24 demonstrating mild, nonobstructive CAD and mild-moderate pulmonary hypertension.  Asymptomatic today and appears euvolemic on exam.  She is currently prescribed bisoprolol, Farxiga, and Lasix 40 mg daily. -No changes today. -Cardiology follow-up scheduled for 9/25 -Will update iron studies to ensure there is no concomitant iron deficiency with HFrEF

## 2021-11-04 NOTE — Assessment & Plan Note (Signed)
BP 120/74 today.  Well-controlled on current regimen of bisoprolol, hydralazine, and Imdur. -No changes today

## 2021-11-04 NOTE — Assessment & Plan Note (Signed)
Presenting today to establish care. -Recent labs reviewed -One-time screening for HCV completed today -States that she has had her screening mammogram completed within the last year.  We will update our records. -Also states she has previously received Shingrix vaccination.  We will update her records. -Plans to receive COVID booster at her pharmacy -Will return for influenza vaccine next month

## 2021-11-04 NOTE — Assessment & Plan Note (Signed)
Renal function at baseline on most recent labs.

## 2021-11-04 NOTE — Assessment & Plan Note (Signed)
Asymptomatic currently.  Unremarkable pulmonary exam.  She is taking Dulera daily and albuterol as needed, and states she has not needed to use her albuterol inhaler recently.

## 2021-11-04 NOTE — Assessment & Plan Note (Addendum)
Multiple prior records reported history of CVA.  Per review of documentation, it appears she presented to the ED in October 2022 with global weakness, dysarthria, and ataxia.  Imaging revealed a remote infarct without acute findings.  Her symptoms were attributed to acute illness vs acute infarct.  ASA 81 mg was recommended.  No residual symptoms appreciated on exam today. -Continue ASA 81 mg and atorvastatin 10 mg daily

## 2021-11-04 NOTE — Assessment & Plan Note (Signed)
History of mild OSA.  Currently using CPAP.

## 2021-11-05 LAB — IRON,TIBC AND FERRITIN PANEL
Ferritin: 104 ng/mL (ref 15–150)
Iron Saturation: 15 % (ref 15–55)
Iron: 50 ug/dL (ref 27–139)
Total Iron Binding Capacity: 340 ug/dL (ref 250–450)
UIBC: 290 ug/dL (ref 118–369)

## 2021-11-05 LAB — HCV AB W REFLEX TO QUANT PCR: HCV Ab: NONREACTIVE

## 2021-11-05 LAB — B12 AND FOLATE PANEL
Folate: >20 ng/mL (ref >3.0)
Vitamin B-12: 1186 pg/mL (ref 232–1245)

## 2021-11-05 LAB — HCV INTERPRETATION

## 2021-11-07 LAB — BASIC METABOLIC PANEL
Anion gap: 9 (ref 5–15)
Anion gap: 7 (ref 5–15)
Anion gap: 7 (ref 5–15)
Anion gap: 8 (ref 5–15)
Anion gap: 6 (ref 5–15)
BUN: 29 mg/dL — ABNORMAL HIGH (ref 8–23)
BUN: 38 mg/dL — ABNORMAL HIGH (ref 8–23)
BUN: 32 mg/dL — ABNORMAL HIGH (ref 8–23)
BUN: 43 mg/dL — ABNORMAL HIGH (ref 8–23)
BUN: 40 mg/dL — ABNORMAL HIGH (ref 8–23)
CO2: 26 mmol/L (ref 22–32)
CO2: 29 mmol/L (ref 22–32)
CO2: 31 mmol/L (ref 22–32)
CO2: 28 mmol/L (ref 22–32)
CO2: 26 mmol/L (ref 22–32)
Calcium: 9.4 mg/dL (ref 8.9–10.3)
Calcium: 8.8 mg/dL — ABNORMAL LOW (ref 8.9–10.3)
Calcium: 8.9 mg/dL (ref 8.9–10.3)
Calcium: 9.3 mg/dL (ref 8.9–10.3)
Calcium: 9.4 mg/dL (ref 8.9–10.3)
Chloride: 105 mmol/L (ref 98–111)
Chloride: 106 mmol/L (ref 98–111)
Chloride: 106 mmol/L (ref 98–111)
Chloride: 100 mmol/L (ref 98–111)
Chloride: 107 mmol/L (ref 98–111)
Creatinine, Ser: 1 mg/dL (ref 0.61–1.24)
Creatinine, Ser: 1.19 mg/dL (ref 0.61–1.24)
Creatinine, Ser: 1.22 mg/dL (ref 0.61–1.24)
Creatinine, Ser: 1.76 mg/dL — ABNORMAL HIGH (ref 0.61–1.24)
Creatinine, Ser: 1.44 mg/dL — ABNORMAL HIGH (ref 0.61–1.24)
GFR, Estimated: 57 mL/min — ABNORMAL LOW (ref >60)
GFR, Estimated: 47 mL/min — ABNORMAL LOW (ref >60)
GFR, Estimated: 45 mL/min — ABNORMAL LOW (ref >60)
GFR, Estimated: 29 mL/min — ABNORMAL LOW (ref >60)
GFR, Estimated: 37 mL/min — ABNORMAL LOW (ref >60)
Glucose, Bld: 137 mg/dL — ABNORMAL HIGH (ref 70–99)
Glucose, Bld: 111 mg/dL — ABNORMAL HIGH (ref 70–99)
Glucose, Bld: 106 mg/dL — ABNORMAL HIGH (ref 70–99)
Glucose, Bld: 115 mg/dL — ABNORMAL HIGH (ref 70–99)
Glucose, Bld: 106 mg/dL — ABNORMAL HIGH (ref 70–99)
Potassium: 4 mmol/L (ref 3.5–5.1)
Potassium: 3.3 mmol/L — ABNORMAL LOW (ref 3.5–5.1)
Potassium: 3.8 mmol/L (ref 3.5–5.1)
Potassium: 4.1 mmol/L (ref 3.5–5.1)
Potassium: 4.2 mmol/L (ref 3.5–5.1)
Sodium: 140 mmol/L (ref 135–145)
Sodium: 142 mmol/L (ref 135–145)
Sodium: 144 mmol/L (ref 135–145)
Sodium: 136 mmol/L (ref 135–145)
Sodium: 139 mmol/L (ref 135–145)

## 2021-11-07 LAB — CBC WITH DIFFERENTIAL/PLATELET
Abs Immature Granulocytes: 0.01 10*3/uL (ref 0.00–0.07)
Basophils Absolute: 0.1 10*3/uL (ref 0.0–0.1)
Basophils Relative: 2 %
Eosinophils Absolute: 0.1 10*3/uL (ref 0.0–0.5)
Eosinophils Relative: 3 %
HCT: 39.2 % (ref 39.0–52.0)
Hemoglobin: 12.6 g/dL — ABNORMAL LOW (ref 13.0–17.0)
Immature Granulocytes: 0 %
Lymphocytes Relative: 24 %
Lymphs Abs: 1 10*3/uL (ref 0.7–4.0)
MCH: 30.1 pg (ref 26.0–34.0)
MCHC: 32.1 g/dL (ref 30.0–36.0)
MCV: 93.8 fL (ref 80.0–100.0)
Monocytes Absolute: 0.5 10*3/uL (ref 0.1–1.0)
Monocytes Relative: 11 %
Neutro Abs: 2.6 10*3/uL (ref 1.7–7.7)
Neutrophils Relative %: 60 %
Platelets: 211 10*3/uL (ref 150–400)
RBC: 4.18 MIL/uL — ABNORMAL LOW (ref 4.22–5.81)
RDW: 14.8 % (ref 11.5–15.5)
WBC: 4.2 10*3/uL (ref 4.0–10.5)
nRBC: 0 % (ref 0.0–0.2)

## 2021-11-07 LAB — COMPREHENSIVE METABOLIC PANEL
ALT: 39 U/L (ref 0–44)
AST: 66 U/L — ABNORMAL HIGH (ref 15–41)
Albumin: 4.3 g/dL (ref 3.5–5.0)
Alkaline Phosphatase: 76 U/L (ref 38–126)
Anion gap: 7 (ref 5–15)
BUN: 24 mg/dL — ABNORMAL HIGH (ref 8–23)
CO2: 23 mmol/L (ref 22–32)
Calcium: 9.2 mg/dL (ref 8.9–10.3)
Chloride: 106 mmol/L (ref 98–111)
Creatinine, Ser: 1 mg/dL (ref 0.61–1.24)
GFR, Estimated: 57 mL/min — ABNORMAL LOW (ref >60)
Glucose, Bld: 93 mg/dL (ref 70–99)
Potassium: 6.2 mmol/L — ABNORMAL HIGH (ref 3.5–5.1)
Sodium: 136 mmol/L (ref 135–145)
Total Bilirubin: 1.7 mg/dL — ABNORMAL HIGH (ref 0.3–1.2)
Total Protein: 7.3 g/dL (ref 6.5–8.1)

## 2021-11-07 LAB — CBC
HCT: 39.4 % (ref 39.0–52.0)
Hemoglobin: 12.5 g/dL — ABNORMAL LOW (ref 13.0–17.0)
MCH: 29.8 pg (ref 26.0–34.0)
MCHC: 31.7 g/dL (ref 30.0–36.0)
MCV: 94 fL (ref 80.0–100.0)
Platelets: 242 10*3/uL (ref 150–400)
RBC: 4.19 MIL/uL — ABNORMAL LOW (ref 4.22–5.81)
RDW: 15.1 % (ref 11.5–15.5)
WBC: 4.8 10*3/uL (ref 4.0–10.5)
nRBC: 0 % (ref 0.0–0.2)

## 2021-11-08 ENCOUNTER — Telehealth: Payer: Self-pay

## 2021-11-08 ENCOUNTER — Ambulatory Visit: Payer: Medicare HMO

## 2021-11-08 DIAGNOSIS — Z0000 Encounter for general adult medical examination without abnormal findings: Secondary | ICD-10-CM

## 2021-11-08 NOTE — Patient Instructions (Signed)
Heather [name] , Thank you for taking time to come for your Medicare Wellness Visit. I appreciate your ongoing commitment to your health goals. Please review the following plan we discussed and let me know if I can assist you in the future.   Screening recommendations/referrals: Bone Density: Completed Recommended yearly ophthalmology/optometry visit for glaucoma screening and checkup Recommended yearly dental visit for hygiene and checkup  Vaccinations: Influenza vaccine: Due Pneumococcal vaccine: Completed Tdap vaccine: Completed Shingles vaccine: Due    Advanced directives: Bring a copy with you to your next appt  Conditions/risks identified: Falls, hypertension  Next appointment: 1 year   Preventive Care 65 Years and Older, Female Preventive care refers to lifestyle choices and visits with your health care provider that can promote health and wellness. What does preventive care include? A yearly physical exam. This is also called an annual well check. Dental exams once or twice a year. Routine eye exams. Ask your health care provider how often you should have your eyes checked. Personal lifestyle choices, including: Daily care of your teeth and gums. Regular physical activity. Eating a healthy diet. Avoiding tobacco and drug use. Limiting alcohol use. Practicing safe sex. Taking low-dose aspirin every day. Taking vitamin and mineral supplements as recommended by your health care provider. What happens during an annual well check? The services and screenings done by your health care provider during your annual well check will depend on your age, overall health, lifestyle risk factors, and family history of disease. Counseling  Your health care provider may ask you questions about your: Alcohol use. Tobacco use. Drug use. Emotional well-being. Home and relationship well-being. Sexual activity. Eating habits. History of falls. Memory and ability to understand  (cognition). Work and work Astronomer. Reproductive health. Screening  You may have the following tests or measurements: Height, weight, and BMI. Blood pressure. Lipid and cholesterol levels. These may be checked every 5 years, or more frequently if you are over 67 years old. Skin check. Lung cancer screening. You may have this screening every year starting at age 19 if you have a 30-pack-year history of smoking and currently smoke or have quit within the past 15 years. Fecal occult blood test (FOBT) of the stool. You may have this test every year starting at age 108. Flexible sigmoidoscopy or colonoscopy. You may have a sigmoidoscopy every 5 years or a colonoscopy every 10 years starting at age 78. Hepatitis C blood test. Hepatitis B blood test. Sexually transmitted disease (STD) testing. Diabetes screening. This is done by checking your blood sugar (glucose) after you have not eaten for a while (fasting). You may have this done every 1-3 years. Bone density scan. This is done to screen for osteoporosis. You may have this done starting at age 58. Mammogram. This may be done every 1-2 years. Talk to your health care provider about how often you should have regular mammograms. Talk with your health care provider about your test results, treatment options, and if necessary, the need for more tests. Vaccines  Your health care provider may recommend certain vaccines, such as: Influenza vaccine. This is recommended every year. Tetanus, diphtheria, and acellular pertussis (Tdap, Td) vaccine. You may need a Td booster every 10 years. Zoster vaccine. You may need this after age 75. Pneumococcal 13-valent conjugate (PCV13) vaccine. One dose is recommended after age 23. Pneumococcal polysaccharide (PPSV23) vaccine. One dose is recommended after age 52. Talk to your health care provider about which screenings and vaccines you need and how often you  need them. This information is not intended to  replace advice given to you by your health care provider. Make sure you discuss any questions you have with your health care provider. Document Released: 03/19/2015 Document Revised: 11/10/2015 Document Reviewed: 12/22/2014 Elsevier Interactive Patient Education  2017 [place] Prevention in the Home Falls can cause injuries. They can happen to people of all ages. There are many things you can do to make your home safe and to help prevent falls. What can I do on the outside of my home? Regularly fix the edges of walkways and driveways and fix any cracks. Remove anything that might make you trip as you walk through a door, such as a raised step or threshold. Trim any bushes or trees on the path to your home. Use bright outdoor lighting. Clear any walking paths of anything that might make someone trip, such as rocks or tools. Regularly check to see if handrails are loose or broken. Make sure that both sides of any steps have handrails. Any raised decks and porches should have guardrails on the edges. Have any leaves, snow, or ice cleared regularly. Use sand or salt on walking paths during winter. Clean up any spills in your garage right away. This includes oil or grease spills. What can I do in the bathroom? Use night lights. Install grab bars by the toilet and in the tub and shower. Do not use towel bars as grab bars. Use non-skid mats or decals in the tub or shower. If you need to sit down in the shower, use a plastic, non-slip stool. Keep the floor dry. Clean up any water that spills on the floor as soon as it happens. Remove soap buildup in the tub or shower regularly. Attach bath mats securely with double-sided non-slip rug tape. Do not have throw rugs and other things on the floor that can make you trip. What can I do in the bedroom? Use night lights. Make sure that you have a light by your bed that is easy to reach. Do not use any sheets or blankets that are too big for  your bed. They should not hang down onto the floor. Have a firm chair that has side arms. You can use this for support while you get dressed. Do not have throw rugs and other things on the floor that can make you trip. What can I do in the kitchen? Clean up any spills right away. Avoid walking on wet floors. Keep items that you use a lot in easy-to-reach places. If you need to reach something above you, use a strong step stool that has a grab bar. Keep electrical cords out of the way. Do not use floor polish or wax that makes floors slippery. If you must use wax, use non-skid floor wax. Do not have throw rugs and other things on the floor that can make you trip. What can I do with my stairs? Do not leave any items on the stairs. Make sure that there are handrails on both sides of the stairs and use them. Fix handrails that are broken or loose. Make sure that handrails are as long as the stairways. Check any carpeting to make sure that it is firmly attached to the stairs. Fix any carpet that is loose or worn. Avoid having throw rugs at the top or bottom of the stairs. If you do have throw rugs, attach them to the floor with carpet tape. Make sure that you have a light switch  at the top of the stairs and the bottom of the stairs. If you do not have them, ask someone to add them for you. What else can I do to help prevent falls? Wear shoes that: Do not have high heels. Have rubber bottoms. Are comfortable and fit you well. Are closed at the toe. Do not wear sandals. If you use a stepladder: Make sure that it is fully opened. Do not climb a closed stepladder. Make sure that both sides of the stepladder are locked into place. Ask someone to hold it for you, if possible. Clearly mark and make sure that you can see: Any grab bars or handrails. First and last steps. Where the edge of each step is. Use tools that help you move around (mobility aids) if they are needed. These  include: Canes. Walkers. Scooters. Crutches. Turn on the lights when you go into a dark area. Replace any light bulbs as soon as they burn out. Set up your furniture so you have a clear path. Avoid moving your furniture around. If any of your floors are uneven, fix them. If there are any pets around you, be aware of where they are. Review your medicines with your doctor. Some medicines can make you feel dizzy. This can increase your chance of falling. Ask your doctor what other things that you can do to help prevent falls. This information is not intended to replace advice given to you by your health care provider. Make sure you discuss any questions you have with your health care provider. Document Released: 12/17/2008 Document Revised: 07/29/2015 Document Reviewed: 03/27/2014 Elsevier Interactive Patient Education  2017 [employer].

## 2021-11-08 NOTE — Addendum Note (Signed)
Addended byTrena Platt on: 11/08/2021 04:26 PM   Modules accepted: Level of Service

## 2021-11-08 NOTE — Progress Notes (Signed)
Subjective:   Heather [name] is a 81 y.o. female who presents for Medicare Annual (Subsequent) preventive examination.  I connected with  [name] on 11/08/21 by a audio enabled telemedicine application and verified that I am speaking with the correct person using two identifiers.  Patient Location: Home  Provider Location: Office/Clinic  I discussed the limitations of evaluation and management by telemedicine. The patient expressed understanding and agreed to proceed.   Review of Systems     [name] , Thank you for taking time to come for your Medicare Wellness Visit. I appreciate your ongoing commitment to your health goals. Please review the following plan we discussed and let me know if I can assist you in the future.   These are the goals we discussed:  Goals      DIET - EAT MORE FRUITS AND VEGETABLES     Exercise 3x per week (30 min per time)     Trying to ride stationary bicycle on daily basis for at least 30 minutes      Weight (lb) < 165 lb (74.8 kg)        This is a list of the screening recommended for you and due dates:  Health Maintenance  Topic Date Due   Flu Shot  10/04/2021   COVID-19 Vaccine (5 - Moderna risk series) 11/19/2021*   Zoster (Shingles) Vaccine (1 of 2) 02/02/2022*   Mammogram  07/06/2022   Tetanus Vaccine  01/12/2030   Pneumonia Vaccine  Completed   DEXA scan (bone density measurement)  Completed   Hepatitis C Screening: USPSTF Recommendation to screen - Ages 71-79 yo.  Completed   HPV Vaccine  Aged Out  *Topic was postponed. The date shown is not the original due date.          Objective:    There were no vitals filed for this visit. There is no height or weight on file to calculate BMI.     10/07/2021    3:00 PM 10/07/2021   12:08 PM 02/27/2021    1:28 PM 12/14/2020    1:20 PM 12/13/2020    7:39 PM 06/30/2020   10:27 AM 10/28/2019   10:00 AM  Advanced Directives  Does Patient Have a Medical Advance Directive? No No No No  No Yes Yes  Type of Careers adviser;Living will Healthcare Power of [place];Living will  Does patient want to make changes to medical advance directive?      No - Patient declined No - Patient declined  Copy of Healthcare Power of Attorney in Chart?      No - copy requested No - copy requested  Would patient like information on creating a medical advance directive? No - Patient declined No - Patient declined No - Patient declined No - Patient declined       Current Medications (verified) Outpatient Encounter Medications as of 11/08/2021  Medication Sig   allopurinol (ZYLOPRIM) 300 MG tablet Take 0.5 tablets (150 mg total) by mouth daily.   aspirin EC 81 MG tablet Take 1 tablet (81 mg total) by mouth daily. Swallow whole.   atorvastatin (LIPITOR) 10 MG tablet Take 1 tablet (10 mg total) by mouth daily.   bisoprolol (ZEBETA) 5 MG tablet Take 0.5 tablets (2.5 mg total) by mouth daily.   Budeson-Glycopyrrol-Formoterol (BREZTRI AEROSPHERE) 160-9-4.8 MCG/ACT AERO Inhale 2 puffs into the lungs 2 (two) times daily. (Patient taking differently: Inhale 2 puffs into  the lungs daily.)   chlorpheniramine (CHLOR-TRIMETON) 4 MG tablet Take 4 mg by mouth 2 (two) times daily as needed for allergies.   Cholecalciferol (DIALYVITE VITAMIN D 5000 PO) Take by mouth.   dapagliflozin propanediol (FARXIGA) 10 MG TABS tablet Take 1 tablet (10 mg total) by mouth daily.   famotidine (PEPCID) 20 MG tablet Take 1 tablet (20 mg total) by mouth at bedtime. One after supper   furosemide (LASIX) 40 MG tablet Take 40 mg by mouth daily.   hydrALAZINE (APRESOLINE) 25 MG tablet Take 25 mg by mouth in the morning and at bedtime.   HYDROcodone-acetaminophen (NORCO/VICODIN) 5-325 MG tablet Take 1 tablet by mouth every 6 (six) hours as needed for moderate pain.   isosorbide mononitrate (IMDUR) 30 MG 24 hr tablet Take 1 tablet (30 mg total) by mouth daily.   ketoconazole (NIZORAL) 2 % cream Apply 1  application topically daily.   Multiple Vitamins-Minerals (MULTIVITAMIN WITH MINERALS) tablet Take 1 tablet by mouth daily.   PROAIR HFA 108 (90 Base) MCG/ACT inhaler Inhale 2 puffs into the lungs every 6 (six) hours as needed for wheezing or shortness of breath.   venlafaxine XR (EFFEXOR-XR) 37.5 MG 24 hr capsule Take 37.5 mg by mouth See admin instructions. Take 2 capsules by mouth daily   zolpidem (AMBIEN) 10 MG tablet Take 1 tablet (10 mg total) by mouth at bedtime.   No facility-administered encounter medications on file as of 11/08/2021.    Allergies (verified) Lovastatin and Lisinopril   History: Past Medical History:  Diagnosis Date   Anemia    Anxiety    Arthritis    COPD (chronic obstructive pulmonary disease) (HCC)    CVA (cerebral vascular accident) (HCC)    Depression    Dry eyes 04/14/2014   GERD (gastroesophageal reflux disease)    Glaucoma    Gout    HTN (hypertension)    OSA (obstructive sleep apnea)    Past Surgical History:  Procedure Laterality Date   ABDOMINAL HYSTERECTOMY     BACK SURGERY     lumbar-disc   BUNIONECTOMY Bilateral    FOOT SURGERY Left    repair of "kissing Cousins"   JOINT REPLACEMENT Bilateral    hip    JOINT REPLACEMENT Right 2010 or 2012   knee   KNEE SURGERY     RIGHT/LEFT HEART CATH AND CORONARY ANGIOGRAPHY N/A 10/27/2021   Procedure: RIGHT/LEFT HEART CATH AND CORONARY ANGIOGRAPHY;  Surgeon: Corky Crafts, MD;  Location: [facility];  Service: Cardiovascular;  Laterality: N/A;   SHOULDER ARTHROSCOPY Right    shoulder surgery in [place] about 2000   SHOULDER HEMI-ARTHROPLASTY Left 03/25/2014   Procedure: LEFT SHOULDER HEMI-ARTHROPLASTY;  Surgeon: Vickki Hearing, MD;  Location: [facility];  Service: Orthopedics;  Laterality: Left;   Family History  Problem Relation Age of Onset   High blood pressure Mother    Aneurysm Father    Diabetes Brother    Pancreatitis Brother    Hypertension Niece    Congestive Heart  Failure Niece    Social History   Socioeconomic History   Marital status: Divorced    Spouse name: Not on file   Number of children: Not on file   Years of education: Not on file   Highest education level: Not on file  Occupational History   Not on file  Tobacco Use   Smoking status: Former    Packs/day: 1.00    Years: 40.00    Total pack years:  40.00    Types: Cigarettes    Quit date: 03/20/1985    Years since quitting: 36.6   Smokeless tobacco: Never  Substance and Sexual Activity   Alcohol use: No   Drug use: No   Sexual activity: Yes    Birth control/protection: Surgical  Other Topics Concern   Not on file  Social History Narrative   Mrs Keady is a 81 year old divorced female who lives alone. She has support of her son and sister.    Social Determinants of Health   Financial Resource Strain: Low Risk  (06/30/2020)   Overall Financial Resource Strain (CARDIA)    Difficulty of Paying Living Expenses: Not hard at all  Food Insecurity: No Food Insecurity (06/30/2020)   Hunger Vital Sign    Worried About Running Out of Food in the Last Year: Never true    Ran Out of Food in the Last Year: Never true  Transportation Needs: No Transportation Needs (06/30/2020)   PRAPARE - Administrator, Civil Service (Medical): No    Lack of Transportation (Non-Medical): No  Physical Activity: Insufficiently Active (06/30/2020)   Exercise Vital Sign    Days of Exercise per Week: 7 days    Minutes of Exercise per Session: 20 min  Stress: No Stress Concern Present (06/30/2020)   [employer] of Occupational Health - Occupational Stress Questionnaire    Feeling of Stress : Not at all  Social Connections: Moderately Isolated (06/30/2020)   Social Connection and Isolation Panel [NHANES]    Frequency of Communication with Friends and Family: More than three times a week    Frequency of Social Gatherings with Friends and Family: Once a week    Attends Religious Services: More  than 4 times per year    Active Member of [employer] or Organizations: No    Attends Banker Meetings: Never    Marital Status: Widowed    Tobacco Counseling Counseling given: Not Answered   Clinical Intake:                 Diabetic? No         Activities of Daily Living    10/07/2021    3:00 PM 12/14/2020    1:20 PM  In your present state of health, do you have any difficulty performing the following activities:  Hearing? 0 0  Vision? 0 0  Difficulty concentrating or making decisions? 0 0  Walking or climbing stairs? 1 0  Comment  can walk but tries to avoid stairs due to copd  Dressing or bathing? 0 1  Doing errands, shopping? 0 0    Patient Care Team: [name], MD as PCP - General (Internal Medicine) O'Neal, Ronnald Ramp, MD as PCP - Cardiology (Cardiology) Beryle Beams, MD as Consulting Physician (Neurology) Vickki Hearing, MD as Consulting Physician (Orthopedic Surgery)  Indicate any recent Medical Services you may have received from other than [facility] providers in the past year (date may be approximate).     Assessment:   This is a routine wellness examination for Ira.  Hearing/Vision screen No results found.  Dietary issues and exercise activities discussed:     Goals Addressed   None   Depression Screen    11/03/2021    2:18 PM 06/30/2020   10:31 AM 02/03/2020    1:51 PM 01/16/2020    8:49 AM 01/13/2020    2:14 PM 10/23/2018    3:33 PM  PHQ 2/9 Scores  PHQ -  2 Score 0 0 0 0 0 1    Fall Risk    11/03/2021    2:18 PM 06/30/2020   10:30 AM 02/03/2020    1:51 PM 01/16/2020    8:48 AM 01/13/2020    2:14 PM  Fall Risk   Falls in the past year? 1 0 0 0 0  Number falls in past yr: 0 0 0 0 0  Injury with Fall? 1 0 0 0 0  Risk for fall due to : Impaired balance/gait;Impaired mobility Orthopedic patient No Fall Risks No Fall Risks No Fall Risks  Follow up Falls evaluation completed Falls evaluation completed;Falls  prevention discussed Falls evaluation completed Falls evaluation completed Falls evaluation completed    FALL RISK PREVENTION PERTAINING TO THE HOME:  Any stairs in or around the home? Yes  If so, are there any without handrails? No  Home free of loose throw rugs in walkways, pet beds, electrical cords, etc? Yes  Adequate lighting in your home to reduce risk of falls? Yes   ASSISTIVE DEVICES UTILIZED TO PREVENT FALLS:  Life alert? No  Use of a cane, walker or w/c? Yes  Grab bars in the bathroom? Yes  Shower chair or bench in shower? Yes  Elevated toilet seat or a handicapped toilet? Yes   Cognitive Function:        06/30/2020   10:34 AM  6CIT Screen  What Year? 0 points  What month? 0 points  What time? 0 points  Count back from 20 0 points  Months in reverse 0 points  Repeat phrase 4 points  Total Score 4 points    Immunizations Immunization History  Administered Date(s) Administered   Influenza-Unspecified 12/28/2017, 12/07/2019, 11/17/2020   Moderna SARS-COV2 Booster Vaccination 07/02/2020   Moderna Sars-Covid-2 Vaccination 05/01/2019, 05/30/2019, 12/04/2019, 07/02/2020   PNEUMOCOCCAL CONJUGATE-20 04/28/2021   Pneumococcal Polysaccharide-23 04/11/2016, 03/10/2020   Tdap 01/13/2020   Zoster, Live 04/14/2016    TDAP status: Up to date  Flu Vaccine status: Due, Education has been provided regarding the importance of this vaccine. Advised may receive this vaccine at local pharmacy or Health Dept. Aware to provide a copy of the vaccination record if obtained from local pharmacy or Health Dept. Verbalized acceptance and understanding.  Pneumococcal vaccine status: Up to date  Covid-19 vaccine status: Completed vaccines  Qualifies for Shingles Vaccine? Yes   Zostavax completed No   Shingrix Completed?: No.    Education has been provided regarding the importance of this vaccine. Patient has been advised to call insurance company to determine out of pocket expense if  they have not yet received this vaccine. Advised may also receive vaccine at local pharmacy or Health Dept. Verbalized acceptance and understanding.  Screening Tests Health Maintenance  Topic Date Due   INFLUENZA VACCINE  10/04/2021   COVID-19 Vaccine (5 - Moderna risk series) 11/19/2021 (Originally 08/27/2020)   Zoster Vaccines- Shingrix (1 of 2) 02/02/2022 (Originally 01/05/1960)   MAMMOGRAM  07/06/2022   TETANUS/TDAP  01/12/2030   Pneumonia Vaccine 37+ Years old  Completed   DEXA SCAN  Completed   Hepatitis C Screening  Completed   HPV VACCINES  Aged Out    Health Maintenance  Health Maintenance Due  Topic Date Due   INFLUENZA VACCINE  10/04/2021    Colorectal cancer screening: No longer required.   Mammogram status: Completed 07/05/2021. Repeat every year  Bone Density status: Completed 12/19/2013. Results reflect: Bone density results: OSTEOPENIA. Repeat every 2 years.  Lung Cancer Screening: (  Low Dose CT Chest recommended if Age 69-80 years, 30 pack-year currently smoking OR have quit w/in 15years.) does not qualify.     Additional Screening:  Hepatitis C Screening: does qualify; Completed 11/03/2021  Vision Screening: Recommended annual ophthalmology exams for early detection of glaucoma and other disorders of the eye. Is the patient up to date with their annual eye exam?  Yes  Who is the provider or what is the name of the office in which the patient attends annual eye exams? [facility]  Dental Screening: Recommended annual dental exams for proper oral hygiene  Community Resource Referral / Chronic Care Management: CRR required this visit?  Yes  CCM required this visit?  No      Plan:     I have personally reviewed and noted the following in the patient's chart:   Medical and social history Use of alcohol, tobacco or illicit drugs  Current medications and supplements including opioid prescriptions. Patient is currently taking opioid prescriptions.  Information provided to patient regarding non-opioid alternatives. Patient advised to discuss non-opioid treatment plan with their provider. Functional ability and status Nutritional status Physical activity Advanced directives List of other physicians Hospitalizations, surgeries, and ER visits in previous 12 months Vitals Screenings to include cognitive, depression, and falls Referrals and appointments  In addition, I have reviewed and discussed with patient certain preventive protocols, quality metrics, and best practice recommendations. A written personalized care plan for preventive services as well as general preventive health recommendations were provided to patient.     Telford Nab, CMA   11/08/2021   Nurse Notes:  Ms. Tinkle , Thank you for taking time to come for your Medicare Wellness Visit. I appreciate your ongoing commitment to your health goals. Please review the following plan we discussed and let me know if I can assist you in the future.   These are the goals we discussed:  Goals      DIET - EAT MORE FRUITS AND VEGETABLES     Exercise 3x per week (30 min per time)     Trying to ride stationary bicycle on daily basis for at least 30 minutes      Weight (lb) < 165 lb (74.8 kg)        This is a list of the screening recommended for you and due dates:  Health Maintenance  Topic Date Due   Flu Shot  10/04/2021   COVID-19 Vaccine (5 - Moderna risk series) 11/19/2021*   Zoster (Shingles) Vaccine (1 of 2) 02/02/2022*   Mammogram  07/06/2022   Tetanus Vaccine  01/12/2030   Pneumonia Vaccine  Completed   DEXA scan (bone density measurement)  Completed   Hepatitis C Screening: USPSTF Recommendation to screen - Ages 8-79 yo.  Completed   HPV Vaccine  Aged Out  *Topic was postponed. The date shown is not the original due date.

## 2021-11-08 NOTE — Telephone Encounter (Signed)
   Telephone encounter was:  Successful.  11/08/2021 Name: Heather [name] MRN: [id number] DOB: [birth year]  Heather [name] is a 81 y.o. year old adult who is a primary care patient of [name], MD . The community resource team was consulted for assistance with  rx and medical financial assistance  Care guide performed the following interventions: Patient provided with information about care guide support team and interviewed to confirm resource needs.  Follow Up Plan:  Care guide will follow up with patient by phone over the next day    Lenard Forth [facility] Guide, [facility], Care Management  [phone] [address] [place], [place], [place] [address] Phone: [phone] Email: [email or web address]

## 2021-11-10 ENCOUNTER — Telehealth: Payer: Self-pay

## 2021-11-10 NOTE — Telephone Encounter (Signed)
   Mailing resources for patient   Lenard Forth [facility] Guide, [facility], Care Management  [phone] [address] [place], [place], [place] [address] Phone: [phone] Email: [email or web address]

## 2021-11-14 ENCOUNTER — Telehealth: Payer: Self-pay | Admitting: Internal Medicine

## 2021-11-14 ENCOUNTER — Other Ambulatory Visit: Payer: Self-pay | Admitting: Pulmonary Disease

## 2021-11-14 ENCOUNTER — Telehealth: Payer: Self-pay | Admitting: Cardiovascular Disease

## 2021-11-14 MED ORDER — TRELEGY ELLIPTA 100-62.5-25 MCG/ACT IN AEPB: 1.0000 | INHALATION_SPRAY | Freq: Every day | RESPIRATORY_TRACT | 11 refills | Status: DC

## 2021-11-14 NOTE — Telephone Encounter (Signed)
Can send script for trelegy 100 one puff daily

## 2021-11-14 NOTE — Telephone Encounter (Signed)
Called and spoke to patient and she voiced understanding of response from Dr. Craige Cotta. She states she would like to change her inhaler from breztri to something else because she feels it is not working for her. Please advise. Thanks!

## 2021-11-14 NOTE — Telephone Encounter (Signed)
Called and notified patient that we do not have dulera samples in office.  She is asking that we send a new prescription for dulera 100 to [facility] and wanted Dr. Craige Cotta to know that at hospital during admission they changed her inhaler from breztri to [place].   *as a note- I did not see Dulera on her current med list or any documentation that her inhaler was changed* Patient states she is sure it was changed.

## 2021-11-14 NOTE — Telephone Encounter (Signed)
Pt c/o medication issue:  1. Name of Medication: dulera 100mg   2. How are you currently taking this medication (dosage and times per day)?    3. Are you having a reaction (difficulty breathing--STAT)? no  4. What is your medication issue? Patient was calling to get a refill but explain its not listed as a current medication. Please advise

## 2021-11-14 NOTE — Telephone Encounter (Signed)
Called and made pt aware of recommendation. She is going to pick up a sample of trelegy 100 tomorrow. Nothing further needed

## 2021-11-14 NOTE — Telephone Encounter (Signed)
Based on most recent guidelines for the management of COPD dulera is not the best option for her management.  Preference is for her to resume using breztri unless she had a complication from using this.

## 2021-11-14 NOTE — Telephone Encounter (Signed)
Patient has COPD and will f/u with her pulmonologist for refills on Dulera.

## 2021-11-21 NOTE — Progress Notes (Signed)
Cardiology Office Note:    Date:  11/28/2021   ID:  Heather [name], DOB [birth year], MRN [id number]  PCP:  [name], MD  [facility] Providers Cardiologist:  Reatha Harps, MD     Referring MD: No ref. provider found   Chief Complaint:  No chief complaint on file.     History of Present Illness:   Heather [name] is a 81 y.o. adult with  history of HTN, LBBB, COPD and prior CVA.      She presented to [facility] ED on 10/07/2021 for evaluation of acute dyspnea which had started earlier in the day prior to arrival and was found to have hypertensive emergency with BP at 192/103. BNP was elevated to 1731 and CXR showed probable mild interstitial edema with a small left pleural effusion. Echocardiogram during admission showed her EF was reduced at 15 to 20% with global hypokinesis and mildly reduced RV function. PASP was moderately elevated at 49 mmHg. She did have mild to moderate mitral valve regurgitation. She responded well with IV Lasix and this was transitioned to 40 mg daily at the time of discharge. She did have frequent PVC's on telemetry and it was recommended she have a 2-week monitor to assess her PVC burden. While an ischemic cardiomyopathy was felt to be less likely, it was recommended to plan for an outpatient cardiac catheterization for definitive evaluation. She was continued on Bisoprolol, Farxiga and Imdur for cardiomyopathy as BP was not felt to allow for ACE-I/ARB at that time. However, Hydralazine was listed on her AVS as she had been on this prior to admission.    R/LHC 10/27/21 nonobstructive CAD and mod pulmonary HTN with evidence of mild volume overload. Zio brief SVT.  Patient comes in for f/u. Has lost 12 lbs in 2 months. Watching salt and wearing compression hose. Some swelling and some shortness of breath with stress and anxiety. 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 She is riding an exercise bike 12  min a day without difficulty. Having a hard time affording farxiga-$90 copay for 30 days.     Past Medical History:  Diagnosis Date   Anemia    Anxiety    Arthritis    COPD (chronic obstructive pulmonary disease) (HCC)    CVA (cerebral vascular accident) (HCC)    Depression    Dry eyes 04/14/2014   GERD (gastroesophageal reflux disease)    Glaucoma    Gout    HTN (hypertension)    OSA (obstructive sleep apnea)    Current Medications: Current Meds  Medication Sig   allopurinol (ZYLOPRIM) 300 MG tablet Take 0.5 tablets (150 mg total) by mouth daily.   aspirin EC 81 MG tablet Take 1 tablet (81 mg total) by mouth daily. Swallow whole.   atorvastatin (LIPITOR) 10 MG tablet Take 1 tablet (10 mg total) by mouth daily.   bisoprolol (ZEBETA) 5 MG tablet Take 0.5 tablets (2.5 mg total) by mouth daily.   Budeson-Glycopyrrol-Formoterol (BREZTRI AEROSPHERE) 160-9-4.8 MCG/ACT AERO Inhale 2 puffs into the lungs 2 (two) times daily. (Patient taking differently: Inhale 2 puffs into the lungs daily.)   chlorpheniramine (CHLOR-TRIMETON) 4 MG tablet Take 4 mg by mouth 2 (two) times daily as needed for allergies.   Cholecalciferol (DIALYVITE VITAMIN D 5000 PO) Take by mouth.   dapagliflozin propanediol (FARXIGA) 10 MG TABS tablet Take 1 tablet (10 mg total) by mouth daily.  famotidine (PEPCID) 20 MG tablet Take 1 tablet (20 mg total) by mouth at bedtime. One after supper   Fluticasone-Umeclidin-Vilant (TRELEGY ELLIPTA) 100-62.5-25 MCG/ACT AEPB Inhale 1 puff into the lungs daily.   HYDROcodone-acetaminophen (NORCO/VICODIN) 5-325 MG tablet Take 1 tablet by mouth every 6 (six) hours as needed for moderate pain.   isosorbide mononitrate (IMDUR) 30 MG 24 hr tablet Take 1 tablet (30 mg total) by mouth daily.   ketoconazole (NIZORAL) 2 % cream Apply 1 application topically daily.   Multiple Vitamins-Minerals (MULTIVITAMIN WITH MINERALS) tablet Take 1 tablet by mouth daily.   PROAIR HFA 108 (90 Base) MCG/ACT  inhaler Inhale 2 puffs into the lungs every 6 (six) hours as needed for wheezing or shortness of breath.   venlafaxine XR (EFFEXOR-XR) 37.5 MG 24 hr capsule Take 37.5 mg by mouth See admin instructions. Take 2 capsules by mouth daily   zolpidem (AMBIEN) 10 MG tablet TAKE (1) TABLET BY MOUTH AT BEDTIME AS NEEDED FOR SLEEP   [DISCONTINUED] furosemide (LASIX) 40 MG tablet Take 40 mg by mouth daily.   [DISCONTINUED] hydrALAZINE (APRESOLINE) 25 MG tablet Take 25 mg by mouth in the morning and at bedtime.    Allergies:   Lovastatin and Lisinopril   Social History   Tobacco Use   Smoking status: Former    Packs/day: 1.00    Years: 40.00    Total pack years: 40.00    Types: Cigarettes    Quit date: 03/20/1985    Years since quitting: 36.7   Smokeless tobacco: Never  Vaping Use   Vaping Use: Never used  Substance Use Topics   Alcohol use: No   Drug use: No    Family Hx: The patient's family history includes Aneurysm in her father; Congestive Heart Failure in her niece; Diabetes in her brother; High blood pressure in her mother; Hypertension in her niece; Pancreatitis in her brother.  ROS   EKGs/Labs/Other Test Reviewed:    EKG:  EKG is not  ordered today.    Recent Labs: 10/07/2021: ALT 39; B Natriuretic Peptide 1,731.0; Magnesium 1.6; TSH 0.983 10/19/2021: Platelets 242 10/27/2021: BUN 30; Creatinine, Ser 1.46; Hemoglobin 11.9; Potassium 4.2; Sodium 141   Recent Lipid Panel Recent Labs    12/14/20 0120  CHOL 196  TRIG 77  HDL 89  VLDL 15  LDLCALC 92     Prior CV Studies:   [facility] 10/27/21   Prox RCA to Mid RCA lesion is 25% stenosed.   Dist RCA lesion is 25% stenosed.   LV end diastolic pressure is moderately elevated.   Hemodynamic findings consistent with moderate pulmonary hypertension.   There is no aortic valve stenosis.   Aortic saturation 93%, PA saturation 58%, PA pressure 54/18, mean PA pressure 35 mmHg, mean pulmonary capillary wedge pressure 18 mmHg, cardiac  output 3.87 L/min, cardiac index 2.24.   Mild to moderate pulmonary artery hypertension.  Evidence of mild volume overload.  Monitor 10/2021 Patch Wear Time:  13 days and 22 hours (2023-08-07T09:41:31-0400 to 2023-08-21T08:03:49-0400)   Patient had a min HR of 48 bpm (sinus bradycardia), max HR of 164 bpm (sinus tachycardia), and avg HR of 77 bpm (normal sinus rhythm). Predominant underlying rhythm was Sinus Rhythm. Bundle Branch Block/IVCD was present. 1 run of Non-Sustained Ventricular Tachycardia occurred lasting 4 beats (2 second duration) with a max rate of 143 bpm (avg 128 bpm). 8 Supraventricular Tachycardia runs occurred, the run with the fastest interval lasting 8 beats (3.3 second duration) with a max rate  of 164 bpm, the longest lasting 17 beats (8 second duration) with an avg rate of 129 bpm. Isolated SVEs were rare (<1.0%), SVE Couplets were rare (<1.0%), and SVE Triplets were rare (<1.0%). Isolated VEs were rare (<1.0%, 10960), VE Couplets were rare (<1.0%, 776), and VE Triplets were rare (<1.0%, 8). Ventricular Trigeminy was present.   Impression: Brief supraventricular tachycardia (8 episodes in 14 days; longest episode 8 seconds).  Rare ectopy.    Gerri Spore T. Flora Lipps, MD, [facility]  [facility]  [address] [place], [place] [address] [phone]  9:07 PM   Echo 10/07/21 ejection fraction, by estimation, is 15-20%. The left ventricle has severely decreased function. The left ventricle demonstrates global hypokinesis. Indeterminate diastolic filling due to E-A fusion. 1. Right ventricular systolic function is mildly reduced. The right ventricular size is mildly enlarged. There is moderately elevated pulmonary artery systolic pressure. The estimated right ventricular systolic pressure is 49.0 mmHg. 2. 3. Left atrial size was moderately dilated. The mitral valve is grossly normal. Mild to moderate mitral valve regurgitation. No evidence of mitral  stenosis. 4. The aortic valve is tricuspid. There is mild calcification of the aortic valve. There is mild thickening of the aortic valve. Aortic valve regurgitation is not visualized. Aortic valve sclerosis/calcification is present, without any evidence of aortic stenosis. 5. The inferior vena cava is normal in size with <50% respiratory variability, suggesting right atrial pressure of 8 mmHg. 6. Comparison(s): Changes from prior study are noted. The left ventricular function is worsened. FINDINGS Left Ventricle: Left ventricular ejection fraction, by estimation, is 15-20%. The left ventricle has severely decreased function. The left ventricle demonstrates global hypokinesis. Definity contrast agent was given IV to delineate the left ventricular endocardial borders. The left ventricular internal cavity size was normal in size. There is no left ventricular hypertrophy. Abnormal (paradoxical) septal motion, consistent      Risk Assessment/Calculations/Metrics:              Physical Exam:    VS:  BP 106/60   Pulse 78   Ht 5' 3.5" (1.613 m)   Wt 153 lb (69.4 kg)   SpO2 94%   BMI 26.68 kg/m     Wt Readings from Last 3 Encounters:  11/28/21 153 lb (69.4 kg)  11/03/21 152 lb 9.6 oz (69.2 kg)  10/27/21 153 lb 9.6 oz (69.7 kg)    Physical Exam  GEN: Well nourished, well developed, in no acute distress  Neck: no JVD, carotid bruits, or masses Cardiac:RRR; no murmurs, rubs, or gallops  Respiratory:  clear to auscultation bilaterally, normal work of breathing GI: soft, nontender, nondistended, + BS Ext: without cyanosis, clubbing, or edema, Good distal pulses bilaterally Neuro:  Alert and Oriented x 3,  Psych: euthymic mood, full affect       ASSESSMENT & PLAN:   No problem-specific Assessment & Plan notes found for this encounter. HFrEF/Mitral Regurgitation - Recent echocardiogram showed her EF was reduced at 15 to 20% with global hypokinesis, mildly reduced RV function,  PASP at 49 mmHg and mild to moderate mitral valve regurgitation.  R/LHC nonobstructive CAD and mod pulm HTN - Will continue Bisoprolol 2.5 mg daily, Farxiga 10 mg daily, Lasix 20 mg daily and Imdur 30 mg daily for her cardiomyopathy Hydralazine was stopped due to hypotension. BP does not allow for ACE-I/ARB/ARNI/Spiro currently but would anticipate adding low-dose Spironolactone or Losartan over restarting Hydralazine if BP allows at subsequent visits.  -having trouble affording farxiga-will have her  fill out patient assistance paperwork and reach out to [facility] -repeat echo.    PVC's - She was noted to have frequent PVC's during her admission and a 14-day Zio patch rare PVC's, brief SVT-asymptomatic. Remains on Bisoprolol 2.5 mg daily.   HTN - Her BP is at 106/60 here and low normal at home. She is currently on Bisoprolol 2.5 mg daily, Farxiga 10 mg daily, Lasix 20 mg daily, Imdur 30 mg daily    Stage 3 CKD - Baseline creatinine 1.0 - 1.1. Will recheck a BMET today           Dispo:  No follow-ups on file.   Medication Adjustments/Labs and Tests Ordered: Current medicines are reviewed at length with the patient today.  Concerns regarding medicines are outlined above.  Tests Ordered: Orders Placed This Encounter  Procedures   Basic metabolic panel   ECHOCARDIOGRAM COMPLETE   Medication Changes: Meds ordered this encounter  Medications   furosemide (LASIX) 40 MG tablet    Sig: Take 0.5 tablets (20 mg total) by mouth daily.    Dispense:  30 tablet    Refill:  0   Signed, Ermalinda Barrios, PA-C  11/28/2021 2:18 PM    [address], [place], [place]  [address] Phone: [phone]; Fax: [phone]

## 2021-11-28 ENCOUNTER — Other Ambulatory Visit (HOSPITAL_COMMUNITY)
Admission: RE | Admit: 2021-11-28 | Discharge: 2021-11-28 | Disposition: A | Discharge status: Home / Self Care | Payer: Medicare HMO | Source: Ambulatory Visit | Attending: Physician Assistant | Admitting: Physician Assistant

## 2021-11-28 ENCOUNTER — Encounter: Payer: Self-pay | Admitting: Physician Assistant

## 2021-11-28 ENCOUNTER — Ambulatory Visit: Payer: Medicare HMO | Attending: Physician Assistant | Admitting: Physician Assistant

## 2021-11-28 VITALS — BP 106/60 | HR 78 | Ht 63.5 in | Wt 153.0 lb

## 2021-11-28 DIAGNOSIS — N1831 Chronic kidney disease, stage 3a: Secondary | ICD-10-CM

## 2021-11-28 DIAGNOSIS — I493 Ventricular premature depolarization: Secondary | ICD-10-CM

## 2021-11-28 DIAGNOSIS — I10 Essential (primary) hypertension: Secondary | ICD-10-CM | POA: Diagnosis not present

## 2021-11-28 DIAGNOSIS — I420 Dilated cardiomyopathy: Secondary | ICD-10-CM | POA: Diagnosis present

## 2021-11-28 LAB — BASIC METABOLIC PANEL
Anion gap: 8 (ref 5–15)
BUN: 28 mg/dL — ABNORMAL HIGH (ref 8–23)
CO2: 24 mmol/L (ref 22–32)
Calcium: 9.5 mg/dL (ref 8.9–10.3)
Chloride: 109 mmol/L (ref 98–111)
Creatinine, Ser: 1.2 mg/dL (ref 0.61–1.24)
GFR, Estimated: >60 mL/min (ref >60)
Glucose, Bld: 99 mg/dL (ref 70–99)
Potassium: 3.9 mmol/L (ref 3.5–5.1)
Sodium: 141 mmol/L (ref 135–145)

## 2021-11-28 MED ORDER — FUROSEMIDE 40 MG PO TABS: 20.0000 mg | ORAL_TABLET | Freq: Every day | ORAL | 0 refills | Status: DC

## 2021-11-28 NOTE — Patient Instructions (Signed)
Medication Instructions:  Your physician recommends that you continue on your current medications as directed. Please refer to the Current Medication list given to you today.   Labwork: Today: BMET  Testing/Procedures: In 2-3 months: Your physician has requested that you have an echocardiogram. Echocardiography is a painless test that uses sound waves to create images of your heart. It provides your doctor with information about the size and shape of your heart and how well your heart's chambers and valves are working. This procedure takes approximately one hour. There are no restrictions for this procedure.   Follow-Up: Establish with Dr. Dellia Cloud in 2-3 months (AFTER [facility]  Any Other Special Instructions Will Be Listed Below (If Applicable).     If you need a refill on your cardiac medications before your next appointment, please call your pharmacy.

## 2021-11-29 ENCOUNTER — Ambulatory Visit (INDEPENDENT_AMBULATORY_CARE_PROVIDER_SITE_OTHER): Payer: Medicare HMO

## 2021-11-29 ENCOUNTER — Ambulatory Visit (INDEPENDENT_AMBULATORY_CARE_PROVIDER_SITE_OTHER): Payer: Medicare HMO | Admitting: Podiatry

## 2021-11-29 DIAGNOSIS — M2031 Hallux varus (acquired), right foot: Secondary | ICD-10-CM | POA: Diagnosis not present

## 2021-11-29 DIAGNOSIS — M2041 Other hammer toe(s) (acquired), right foot: Secondary | ICD-10-CM

## 2021-11-29 NOTE — Progress Notes (Signed)
  Subjective:  Patient ID: Lazarus Gowda, adult    DOB: 01-17-1941,  MRN: [id number]  Chief Complaint  Patient presents with   Hammer Toe    POV #5 DOS 08/05/2021 GREAT TOE JOINT FUSION, REMOVAL OF BONES BEHIND TOES, HAMMERTOE CORRECTION 2-5 right     81 y.o. adult returns for post-op check.  She is doing well she not having any pain she is back in regular shoes and is able to walk comfortably  Review of Systems: Negative except as noted in the HPI. Denies N/V/F/Ch.   Objective:  There were no vitals filed for this visit. There is no height or weight on file to calculate BMI. Constitutional Well developed. Well nourished.  Vascular Foot warm and well perfused. Capillary refill normal to all digits.  Calf is soft and supple, no posterior calf or knee pain, negative Homans' sign  Neurologic Normal speech. Oriented to person, place, and time. Epicritic sensation to light touch grossly present bilaterally.  Dermatologic Incisions are well-healed and nonhypertrophic  Orthopedic: She has no edema.  No pain to palpation.   Multiple view plain film radiographs: Remaining portions of the MTPJ have completely healed there is no complication of hardware, alignment maintained Assessment:   1. Hammertoe of right foot   2. Hallux varus, right    Plan:  Patient was evaluated and treated and all questions answered.  S/p foot surgery right -Overall doing very well and back in regular shoe gear.  I have no further restrictions for her at this point.  She will return to see me as needed if she has further issues with this  No follow-ups on file.

## 2021-12-01 NOTE — Addendum Note (Signed)
Addended by: Johny Drilling on: 12/01/2021 04:04 PM   Modules accepted: Level of Service

## 2021-12-02 NOTE — Progress Notes (Signed)
Pt had already done awv per dr patel.

## 2021-12-02 NOTE — Addendum Note (Signed)
Addended by: Johny Drilling on: 12/02/2021 11:17 AM   Modules accepted: Level of Service

## 2021-12-08 ENCOUNTER — Encounter: Payer: Self-pay | Admitting: Pulmonary Disease

## 2021-12-08 ENCOUNTER — Ambulatory Visit: Payer: Medicare HMO | Admitting: Pulmonary Disease

## 2021-12-08 VITALS — BP 122/76 | HR 62 | Temp 97.9°F | Ht 63.0 in | Wt 153.0 lb

## 2021-12-08 DIAGNOSIS — F5101 Primary insomnia: Secondary | ICD-10-CM

## 2021-12-08 DIAGNOSIS — G4733 Obstructive sleep apnea (adult) (pediatric): Secondary | ICD-10-CM | POA: Diagnosis not present

## 2021-12-08 DIAGNOSIS — J449 Chronic obstructive pulmonary disease, unspecified: Secondary | ICD-10-CM

## 2021-12-08 NOTE — Patient Instructions (Signed)
Will get a copy of your CPAP report and call with results  Follow up in 6 months

## 2021-12-08 NOTE — Progress Notes (Signed)
[facility]  Chief Complaint  Patient presents with   Follow-up    Currently using cpap machine      Past Surgical History:  She  has a past surgical history that includes Abdominal hysterectomy; Foot surgery (Left); Bunionectomy (Bilateral); Back surgery; Shoulder hemi-arthroplasty (Left, 03/25/2014); Shoulder arthroscopy (Right); Knee surgery; Joint replacement (Bilateral); Joint replacement (Right, 2010 or 2012); and RIGHT/LEFT HEART CATH AND CORONARY ANGIOGRAPHY (N/A, 10/27/2021).  Past Medical History:  Anemia, Anxiety, OA, COPD, Depression, GERD, Glaucoma, Gout, HTN, Tremor, Back pain, Rt cerebellar CVA  Constitutional:  BP 122/76 (BP Location: Left Arm)   Pulse 62   Temp 97.9 F (36.6 C) (Temporal)   Ht 5\' 3"  (1.6 m)   Wt 153 lb (69.4 kg)   SpO2 96% Comment: ra  BMI 27.10 kg/m   Brief Summary:  Heather [name] is a 81 y.o. adult former smoker with insomnia, obstructive sleep apnea and COPD.      Subjective:   Sleeping better since starting CPAP.  Has full face mask.  Has been getting some mask leak.  Breathing okay.  Not having cough, wheeze, or sputum.  Asked about COVID and RSV vaccines.  Physical Exam:   Appearance - well kempt   ENMT - no sinus tenderness, no oral exudate, no LAN, Mallampati 3 airway, no stridor, wear dentures  Respiratory - equal breath sounds bilaterally, no wheezing or rales  CV - s1s2 regular rate and rhythm, no murmurs  Ext - no clubbing, no edema  Skin - no rashes  Psych - normal mood and affect     Pulmonary testing:  PFT 05/25/20 >> FEV1 1.40 (97%), FEV1% 68, TLC 6.52 ( 137%), RV 4.28 (190%), DLCO 58%  Sleep Tests:  ONO with RA 12/30/20 >> test time 7 hrs 29 min.  Baseline SpO2 92%, low SpO2 80%.  Spent 4 min with SpO2 < 88%. HST 04/13/21 >> AHI 5.7, SpO2 low 79%  Cardiac Tests:  Echo 12/14/20 >> EF 50 to 55%, grade 1 DD Bilateral U/S lower legs 02/21/21 >> no DVT Echo  10/07/21 >> EF 15 to 20%, RVSP 49 mmHg, mild/mod MR  Social History:  She  reports that she quit smoking about 36 years ago. Her smoking use included cigarettes. She has a 40.00 pack-year smoking history. She has never used smokeless tobacco. She reports that she does not drink alcohol and does not use drugs.  Family History:  Her family history includes Aneurysm in her father; Congestive Heart Failure in her niece; Diabetes in her brother; High blood pressure in her mother; Hypertension in her niece; Pancreatitis in her brother.     Assessment/Plan:   Obstructive sleep apnea. - she is compliant with CPAP and reports benefit from therapy - uses [employer] for her DME - current CPAP ordered February 2023 - discussed options to assist with mask fit and minimize air leak - continue auto CPAP 5 to 15 cm H2O - will call with results of her download  Insomnia. - she has been on [place] therapy for years and has been tolerating this - continue ambien 10 mg nightly  COPD. - continue trelegy 100 one puff daily - prn albuterol  Chronic systolic CHF with dilated cardiomyopathy. - followed by Dr. Eleonore Chiquito with cardiology  Time Spent Involved in Patient Care on Day of Examination:  26 minutes  Follow up:   Patient Instructions  Will get a copy of your CPAP report and call with results  Follow up in 6 months  Medication List:   Allergies as of 12/08/2021       Reactions   Lovastatin Swelling   Patient states that her tongue swells and she gets a tingling feeling all over. Patient states that her tongue swells and she gets a tingling feeling all over.   Lisinopril         Medication List        Accurate as of December 08, 2021 11:22 AM. If you have any questions, ask your nurse or doctor.          allopurinol 300 MG tablet Commonly known as: ZYLOPRIM Take 0.5 tablets (150 mg total) by mouth daily.   aspirin EC 81 MG tablet Take 1 tablet (81 mg total) by mouth daily.  Swallow whole.   atorvastatin 10 MG tablet Commonly known as: LIPITOR Take 1 tablet (10 mg total) by mouth daily.   bisoprolol 5 MG tablet Commonly known as: ZEBETA Take 0.5 tablets (2.5 mg total) by mouth daily.   chlorpheniramine 4 MG tablet Commonly known as: CHLOR-TRIMETON Take 4 mg by mouth 2 (two) times daily as needed for allergies.   dapagliflozin propanediol 10 MG Tabs tablet Commonly known as: FARXIGA Take 1 tablet (10 mg total) by mouth daily.   DIALYVITE VITAMIN D 5000 PO Take by mouth.   famotidine 20 MG tablet Commonly known as: Pepcid Take 1 tablet (20 mg total) by mouth at bedtime. One after supper   furosemide 40 MG tablet Commonly known as: LASIX Take 0.5 tablets (20 mg total) by mouth daily.   HYDROcodone-acetaminophen 5-325 MG tablet Commonly known as: NORCO/VICODIN Take 1 tablet by mouth every 6 (six) hours as needed for moderate pain.   isosorbide mononitrate 30 MG 24 hr tablet Commonly known as: IMDUR Take 1 tablet (30 mg total) by mouth daily.   ketoconazole 2 % cream Commonly known as: NIZORAL Apply 1 application topically daily.   multivitamin with minerals tablet Take 1 tablet by mouth daily.   ProAir HFA 108 (90 Base) MCG/ACT inhaler Generic drug: albuterol Inhale 2 puffs into the lungs every 6 (six) hours as needed for wheezing or shortness of breath.   Trelegy Ellipta 100-62.5-25 MCG/ACT Aepb Generic drug: Fluticasone-Umeclidin-Vilant Inhale 1 puff into the lungs daily.   venlafaxine XR 37.5 MG 24 hr capsule Commonly known as: EFFEXOR-XR Take 37.5 mg by mouth See admin instructions. Take 2 capsules by mouth daily   zolpidem 10 MG tablet Commonly known as: AMBIEN TAKE (1) TABLET BY MOUTH AT BEDTIME AS NEEDED FOR SLEEP        Signature:  Chesley Mires, MD [place] Pager - [phone] 12/08/2021, 11:22 AM

## 2022-01-05 ENCOUNTER — Telehealth: Payer: Self-pay | Admitting: Cardiovascular Disease

## 2022-01-05 MED ORDER — DAPAGLIFLOZIN PROPANEDIOL 10 MG PO TABS: 10.0000 mg | ORAL_TABLET | Freq: Every day | ORAL | 10 refills | Status: DC

## 2022-01-05 MED ORDER — BISOPROLOL FUMARATE 5 MG PO TABS: 2.5000 mg | ORAL_TABLET | Freq: Every day | ORAL | 10 refills | Status: DC

## 2022-01-05 MED ORDER — ISOSORBIDE MONONITRATE ER 30 MG PO TB24: 30.0000 mg | ORAL_TABLET | Freq: Every day | ORAL | 10 refills | Status: DC

## 2022-01-05 NOTE — Telephone Encounter (Signed)
*  STAT* If patient is at the pharmacy, call can be transferred to refill team.   1. Which medications need to be refilled? (please list name of each medication and dose if known)   bisoprolol (ZEBETA) 5 MG tablet (90 day) isosorbide mononitrate (IMDUR) 30 MG 24 hr tablet (90 day) - 1 tablet left dapagliflozin propanediol (FARXIGA) 10 MG TABS tablet  (30 day)   2. Which pharmacy/location (including street and city if local pharmacy) is medication to be sent to?  [place], [address]   3. Do they need a 30 day or 90 day supply?   Patient stated she has applied to get help with the [facility] but she has not heard from the program.  Patient is mostly out of this medication.

## 2022-02-02 ENCOUNTER — Encounter: Payer: Self-pay | Admitting: Internal Medicine

## 2022-02-02 ENCOUNTER — Ambulatory Visit (INDEPENDENT_AMBULATORY_CARE_PROVIDER_SITE_OTHER): Payer: Medicare HMO | Admitting: Internal Medicine

## 2022-02-02 VITALS — BP 100/60 | HR 81 | Ht 63.0 in | Wt 157.2 lb

## 2022-02-02 DIAGNOSIS — I5021 Acute systolic (congestive) heart failure: Secondary | ICD-10-CM | POA: Diagnosis not present

## 2022-02-02 DIAGNOSIS — I10 Essential (primary) hypertension: Secondary | ICD-10-CM

## 2022-02-02 DIAGNOSIS — G4733 Obstructive sleep apnea (adult) (pediatric): Secondary | ICD-10-CM

## 2022-02-02 DIAGNOSIS — R140 Abdominal distension (gaseous): Secondary | ICD-10-CM | POA: Diagnosis not present

## 2022-02-02 DIAGNOSIS — J449 Chronic obstructive pulmonary disease, unspecified: Secondary | ICD-10-CM

## 2022-02-02 DIAGNOSIS — R600 Localized edema: Secondary | ICD-10-CM

## 2022-02-02 MED ORDER — FUROSEMIDE 40 MG PO TABS: 20.0000 mg | ORAL_TABLET | Freq: Every day | ORAL | 0 refills | Status: DC

## 2022-02-02 NOTE — Assessment & Plan Note (Signed)
Today she endorses worsening bilateral ankle edema.  Denies orthopnea/PND. She is prescribed Lasix 20 mg daily for treatment of HFrEF.  I have recommended that she take an additional dose of Lasix as needed for worsened lower extremity edema.  She has a repeat echocardiogram scheduled for 12/13 and cardiology follow-up scheduled for March 2024.

## 2022-02-02 NOTE — Progress Notes (Signed)
Established Patient Office Visit  Subjective   Patient ID: HONI NAME, adult    DOB: 1940-07-02  Age: 81 y.o. MRN: [id number]  Chief Complaint  Patient presents with   Follow-up   Gas    Noticed worse with diary products   Ms. Delucchi returns to care today.  She was last seen by me on 8/31 to establish care.  Baseline labs were obtained and 90-month follow-up was arranged.  In the interim she has been seen by pulmonology and cardiology and follow-up no significant medication changes made.  Today Ms. Daywalt endorses abdominal bloating and flatulence.  This has been present for several weeks.  She has not identified a specific pattern but has noticed an association with dairy products.  She has not had a significant change in her stool and specifically denies melena/hematochezia.  Ms. Keleher additionally notes increased bilateral ankle edema.  She has noticed worsened dyspnea with exertion as well, but is unsure if this is related to changes in the weather, COPD, or heart failure.  Acute concerns, chronic medical conditions, and outstanding preventative care items discussed today are individually addressed in A/P below.  Past Medical History:  Diagnosis Date   Anemia    Anxiety    Arthritis    COPD (chronic obstructive pulmonary disease) (HCC)    CVA (cerebral vascular accident) (HCC)    Depression    Dry eyes 04/14/2014   GERD (gastroesophageal reflux disease)    Glaucoma    Gout    HTN (hypertension)    OSA (obstructive sleep apnea)    Past Surgical History:  Procedure Laterality Date   ABDOMINAL HYSTERECTOMY     BACK SURGERY     lumbar-disc   BUNIONECTOMY Bilateral    FOOT SURGERY Left    repair of "kissing Cousins"   JOINT REPLACEMENT Bilateral    hip    JOINT REPLACEMENT Right 2010 or 2012   knee   KNEE SURGERY     RIGHT/LEFT HEART CATH AND CORONARY ANGIOGRAPHY N/A 10/27/2021   Procedure: RIGHT/LEFT HEART CATH AND CORONARY ANGIOGRAPHY;  Surgeon: Corky Crafts, MD;   [facility]: [facility];  Service: Cardiovascular;  Laterality: N/A;   SHOULDER ARTHROSCOPY Right    shoulder surgery in [place] about 2000   SHOULDER HEMI-ARTHROPLASTY Left 03/25/2014   Procedure: LEFT SHOULDER HEMI-ARTHROPLASTY;  Surgeon: Vickki Hearing, MD;  Location: [facility];  Service: Orthopedics;  Laterality: Left;   Social History   Tobacco Use   Smoking status: Former    Packs/day: 1.00    Years: 40.00    Total pack years: 40.00    Types: Cigarettes    Quit date: 03/20/1985    Years since quitting: 36.8   Smokeless tobacco: Never  Vaping Use   Vaping Use: Never used  Substance Use Topics   Alcohol use: No   Drug use: No   Family History  Problem Relation Age of Onset   High blood pressure Mother    Aneurysm Father    Diabetes Brother    Pancreatitis Brother    Hypertension Niece    Congestive Heart Failure Niece    Allergies  Allergen Reactions   Lovastatin Swelling    Patient states that her tongue swells and she gets a tingling feeling all over. Patient states that her tongue swells and she gets a tingling feeling all over.   Lisinopril    Review of Systems  Respiratory:  Positive for shortness of breath (Dyspnea with exertion).  Cardiovascular:  Positive for leg swelling (Bilateral ankle edema). Negative for chest pain, palpitations, orthopnea and PND.  Gastrointestinal:        Bloating, flatulence  All other systems reviewed and are negative.    Objective:     BP 100/60   Pulse 81   Ht 5\' 3"  (1.6 m)   Wt 157 lb 3.2 oz (71.3 kg)   SpO2 90%   BMI 27.85 kg/m  BP Readings from Last 3 Encounters:  02/02/22 100/60  12/08/21 122/76  11/28/21 106/60   Physical Exam Vitals reviewed.  Constitutional:      General: She is not in acute distress.    Appearance: Normal appearance. She is not toxic-appearing.  HENT:     Head: Normocephalic and atraumatic.     Right Ear: External ear normal.     Left Ear: External ear normal.     Nose: Nose  normal. No congestion or rhinorrhea.     Mouth/Throat:     Mouth: Mucous membranes are moist.     Pharynx: Oropharynx is clear. No oropharyngeal exudate or posterior oropharyngeal erythema.  Eyes:     General: No scleral icterus.    Extraocular Movements: Extraocular movements intact.     Conjunctiva/sclera: Conjunctivae normal.     Pupils: Pupils are equal, round, and reactive to light.  Cardiovascular:     Rate and Rhythm: Normal rate and regular rhythm.     Pulses: Normal pulses.     Heart sounds: Normal heart sounds. No murmur heard.    No friction rub. No gallop.  Pulmonary:     Effort: Pulmonary effort is normal.     Breath sounds: Normal breath sounds. No wheezing, rhonchi or rales.  Abdominal:     General: Abdomen is flat. Bowel sounds are normal. There is no distension.     Palpations: Abdomen is soft.     Tenderness: There is no abdominal tenderness.  Musculoskeletal:        General: No swelling. Normal range of motion.     Cervical back: Normal range of motion.     Right lower leg: Edema (ankle) present.     Left lower leg: Left lower leg edema: ankle.  Lymphadenopathy:     Cervical: No cervical adenopathy.  Skin:    General: Skin is warm and dry.     Capillary Refill: Capillary refill takes less than 2 seconds.     Coloration: Skin is not jaundiced.  Neurological:     General: No focal deficit present.     Mental Status: She is alert and oriented to person, place, and time.  Psychiatric:        Mood and Affect: Mood normal.        Behavior: Behavior normal.    Last CBC Lab Results  Component Value Date   WBC 4.8 10/19/2021   HGB 11.9 (L) 10/27/2021   HCT 35.0 (L) 10/27/2021   MCV 94.0 10/19/2021   MCH 29.8 10/19/2021   RDW 15.1 10/19/2021   PLT 242 10/19/2021   Last metabolic panel Lab Results  Component Value Date   GLUCOSE 99 11/28/2021   NA 141 11/28/2021   K 3.9 11/28/2021   CL 109 11/28/2021   CO2 24 11/28/2021   BUN 28 (H) 11/28/2021    CREATININE 1.20 11/28/2021   GFRNONAA >60 11/28/2021   CALCIUM 9.5 11/28/2021   PHOS 3.3 10/07/2021   PROT 7.3 10/07/2021   ALBUMIN 4.3 10/07/2021   LABGLOB 2.2 01/13/2020  AGRATIO 2.1 01/13/2020   BILITOT 1.7 (H) 10/07/2021   ALKPHOS 76 10/07/2021   AST 66 (H) 10/07/2021   ALT 39 10/07/2021   ANIONGAP 8 11/28/2021   Last lipids Lab Results  Component Value Date   CHOL 196 12/14/2020   HDL 89 12/14/2020   LDLCALC 92 12/14/2020   TRIG 77 12/14/2020   CHOLHDL 2.2 12/14/2020   Last hemoglobin A1c Lab Results  Component Value Date   HGBA1C 5.8 (H) 12/13/2020   Last thyroid functions Lab Results  Component Value Date   TSH 0.983 10/07/2021   Last vitamin D Lab Results  Component Value Date   VD25OH 65.1 07/07/2021   Last vitamin B12 and Folate Lab Results  Component Value Date   VITAMINB12 1,186 11/03/2021   FOLATE >20.0 11/03/2021     Assessment & Plan:   Problem List Items Addressed This Visit       Leg edema    Today she endorses worsening bilateral ankle edema.  Denies orthopnea/PND. She is prescribed Lasix 20 mg daily for treatment of HFrEF.  I have recommended that she take an additional dose of Lasix as needed for worsened lower extremity edema.  She has a repeat echocardiogram scheduled for 12/13 and cardiology follow-up scheduled for March 2024.      Bloating    Today she endorses a several week history of bloating and flatulence.  This has been embarrassing for her in public.  She has not identified any known triggers or patterns associated with the onset of her symptoms.  She believes it may be worse with dairy products. -I have recommended the FODMAP diet to her as well as use of a probiotic.  She can also attempt to take Gas-X or Gaviscon as needed for symptom relief.      Return in about 3 months (around 05/04/2022).    [name], MD

## 2022-02-02 NOTE — Assessment & Plan Note (Signed)
Today she endorses a several week history of bloating and flatulence.  This has been embarrassing for her in public.  She has not identified any known triggers or patterns associated with the onset of her symptoms.  She believes it may be worse with dairy products. -I have recommended the FODMAP diet to her as well as use of a probiotic.  She can also attempt to take Gas-X or Gaviscon as needed for symptom relief.

## 2022-02-02 NOTE — Patient Instructions (Addendum)
It was a pleasure to see you today.  Thank you for giving [place] the opportunity to be involved in your care.  Below is a brief recap of your visit and next steps.  We will plan to see you again in 3 months.  Summary No medication changes today. I have refilled lasix. You can try taking an extra dose as needed for ankle swelling. Please see the diet information attached for gas relief. I also recommend a probiotic and trying Gas-X or Gaviscon.

## 2022-02-15 ENCOUNTER — Ambulatory Visit: Payer: Medicare HMO | Attending: Physician Assistant

## 2022-02-15 DIAGNOSIS — I420 Dilated cardiomyopathy: Secondary | ICD-10-CM

## 2022-02-15 LAB — ECHOCARDIOGRAM COMPLETE
AR max vel: 1.92 cm2
AV Peak grad: 5.3 mmHg
Ao pk vel: 1.15 m/s
Area-P 1/2: 4.06 cm2
Calc EF: 14.9 %
MV M vel: 4.74 m/s
MV Peak grad: 89.7 mmHg
Radius: 0.4 cm
S' Lateral: 5.2 cm
Single Plane A2C EF: 13.9 %
Single Plane A4C EF: 16.1 %

## 2022-02-15 MED ORDER — PERFLUTREN LIPID MICROSPHERE
1.0000 mL | INTRAVENOUS | Status: AC | PRN
  Administered 2022-02-15: 1 mL via INTRAVENOUS

## 2022-02-17 ENCOUNTER — Telehealth: Payer: Self-pay | Admitting: Physician Assistant

## 2022-02-17 ENCOUNTER — Encounter: Payer: Self-pay | Admitting: Physical Medicine & Rehabilitation

## 2022-02-17 DIAGNOSIS — I420 Dilated cardiomyopathy: Secondary | ICD-10-CM

## 2022-02-17 NOTE — Telephone Encounter (Signed)
Heather Kief, PA-C  [facility] Triage Echo shows reduced heart function similar to before. Please ask her how her breathing and swelling are? Also see if she would be able to get transport to advanced heart failure clinic in [place]? If so, please refer her. If she can't get to [facility] and is struggling please move up appt with Dr. Jenene Slicker. Thanks    I spoke with patient and discussed her echo results. She saw pcp 02/02/22 and he advised her to take lasix 20 mg with an additional 20 mg as needed for swelling.Ms.Swallows still c/o leg swelling and SOB when trying to sleep at night.Her current weight is 155 lbs   She would like to be seen by the heart failure clinic in [place], she says she can get there. I will place referral   I will DYI M.Lenze,PA-C

## 2022-02-17 NOTE — Telephone Encounter (Signed)
Patient returning call for echo results. 

## 2022-03-02 ENCOUNTER — Telehealth: Payer: Self-pay | Admitting: Internal Medicine

## 2022-03-02 NOTE — Telephone Encounter (Signed)
Patient called in regard to appt on 02/02/22  Patient was advised toi call back if swelling proceeded.  Patient states that swelling in legs, ankles and feet is getting worse, wants a call back

## 2022-03-03 NOTE — Telephone Encounter (Signed)
Contacted patient to discuss her concerns.  She reports bilateral lower extremity edema, mostly around her ankles.  She also endorses increasing dyspnea on exertion.  She is able to speak in full sentences and is not short of breath during our conversation.  She denies orthopnea/PND.  She weighs herself daily.  Her weight today is 162 LBS.  Her weight at the time of her last encounter was 157 LBS.  On medication review, she has been taking Lasix 20 mg every other day instead of daily as prescribed.  My recommendation was that she present to the emergency department for evaluation if her respiratory status continues to deteriorate.  I instructed her to take Lasix 40 mg daily for the next several days and then return to 20 mg daily as her symptoms resolve.  She currently has follow-up scheduled with me in late February and cardiology scheduled for early March.  I will request a follow-up appointment for her in January for close monitoring.

## 2022-03-13 ENCOUNTER — Inpatient Hospital Stay (HOSPITAL_COMMUNITY)
Admission: EM | Admit: 2022-03-13 | Discharge: 2022-03-20 | DRG: 291 | Disposition: A | Discharge status: Home Health | Payer: Medicare Other | Attending: Internal Medicine | Admitting: Internal Medicine

## 2022-03-13 ENCOUNTER — Other Ambulatory Visit: Payer: Self-pay

## 2022-03-13 ENCOUNTER — Encounter (HOSPITAL_COMMUNITY): Payer: Self-pay | Admitting: Emergency Medicine

## 2022-03-13 ENCOUNTER — Emergency Department (HOSPITAL_COMMUNITY): Payer: Medicare Other

## 2022-03-13 ENCOUNTER — Telehealth: Payer: Self-pay | Admitting: Internal Medicine

## 2022-03-13 ENCOUNTER — Other Ambulatory Visit: Payer: Self-pay | Admitting: Internal Medicine

## 2022-03-13 DIAGNOSIS — Z79899 Other long term (current) drug therapy: Secondary | ICD-10-CM

## 2022-03-13 DIAGNOSIS — G4733 Obstructive sleep apnea (adult) (pediatric): Secondary | ICD-10-CM | POA: Diagnosis not present

## 2022-03-13 DIAGNOSIS — H409 Unspecified glaucoma: Secondary | ICD-10-CM | POA: Diagnosis present

## 2022-03-13 DIAGNOSIS — N1831 Chronic kidney disease, stage 3a: Secondary | ICD-10-CM | POA: Diagnosis not present

## 2022-03-13 DIAGNOSIS — Z96651 Presence of right artificial knee joint: Secondary | ICD-10-CM | POA: Diagnosis present

## 2022-03-13 DIAGNOSIS — I509 Heart failure, unspecified: Principal | ICD-10-CM

## 2022-03-13 DIAGNOSIS — N1832 Chronic kidney disease, stage 3b: Secondary | ICD-10-CM | POA: Diagnosis present

## 2022-03-13 DIAGNOSIS — I5023 Acute on chronic systolic (congestive) heart failure: Secondary | ICD-10-CM | POA: Diagnosis not present

## 2022-03-13 DIAGNOSIS — E876 Hypokalemia: Secondary | ICD-10-CM | POA: Diagnosis not present

## 2022-03-13 DIAGNOSIS — Z87891 Personal history of nicotine dependence: Secondary | ICD-10-CM

## 2022-03-13 DIAGNOSIS — I2510 Atherosclerotic heart disease of native coronary artery without angina pectoris: Secondary | ICD-10-CM | POA: Diagnosis present

## 2022-03-13 DIAGNOSIS — J90 Pleural effusion, not elsewhere classified: Secondary | ICD-10-CM | POA: Diagnosis not present

## 2022-03-13 DIAGNOSIS — Z8673 Personal history of transient ischemic attack (TIA), and cerebral infarction without residual deficits: Secondary | ICD-10-CM

## 2022-03-13 DIAGNOSIS — Z96612 Presence of left artificial shoulder joint: Secondary | ICD-10-CM | POA: Diagnosis present

## 2022-03-13 DIAGNOSIS — R570 Cardiogenic shock: Secondary | ICD-10-CM | POA: Diagnosis not present

## 2022-03-13 DIAGNOSIS — K219 Gastro-esophageal reflux disease without esophagitis: Secondary | ICD-10-CM | POA: Diagnosis present

## 2022-03-13 DIAGNOSIS — I5082 Biventricular heart failure: Secondary | ICD-10-CM | POA: Diagnosis not present

## 2022-03-13 DIAGNOSIS — I110 Hypertensive heart disease with heart failure: Secondary | ICD-10-CM | POA: Diagnosis not present

## 2022-03-13 DIAGNOSIS — Z833 Family history of diabetes mellitus: Secondary | ICD-10-CM

## 2022-03-13 DIAGNOSIS — F32A Depression, unspecified: Secondary | ICD-10-CM | POA: Diagnosis not present

## 2022-03-13 DIAGNOSIS — I252 Old myocardial infarction: Secondary | ICD-10-CM | POA: Diagnosis not present

## 2022-03-13 DIAGNOSIS — I429 Cardiomyopathy, unspecified: Secondary | ICD-10-CM | POA: Diagnosis not present

## 2022-03-13 DIAGNOSIS — R9431 Abnormal electrocardiogram [ECG] [EKG]: Secondary | ICD-10-CM | POA: Diagnosis present

## 2022-03-13 DIAGNOSIS — I428 Other cardiomyopathies: Secondary | ICD-10-CM | POA: Diagnosis present

## 2022-03-13 DIAGNOSIS — I255 Ischemic cardiomyopathy: Secondary | ICD-10-CM | POA: Diagnosis present

## 2022-03-13 DIAGNOSIS — Z96643 Presence of artificial hip joint, bilateral: Secondary | ICD-10-CM | POA: Diagnosis present

## 2022-03-13 DIAGNOSIS — I130 Hypertensive heart and chronic kidney disease with heart failure and stage 1 through stage 4 chronic kidney disease, or unspecified chronic kidney disease: Secondary | ICD-10-CM | POA: Diagnosis not present

## 2022-03-13 DIAGNOSIS — E785 Hyperlipidemia, unspecified: Secondary | ICD-10-CM

## 2022-03-13 DIAGNOSIS — I447 Left bundle-branch block, unspecified: Secondary | ICD-10-CM | POA: Diagnosis present

## 2022-03-13 DIAGNOSIS — I272 Pulmonary hypertension, unspecified: Secondary | ICD-10-CM | POA: Diagnosis not present

## 2022-03-13 DIAGNOSIS — Z888 Allergy status to other drugs, medicaments and biological substances status: Secondary | ICD-10-CM | POA: Diagnosis not present

## 2022-03-13 DIAGNOSIS — I10 Essential (primary) hypertension: Secondary | ICD-10-CM | POA: Diagnosis not present

## 2022-03-13 DIAGNOSIS — Z8249 Family history of ischemic heart disease and other diseases of the circulatory system: Secondary | ICD-10-CM

## 2022-03-13 DIAGNOSIS — R0602 Shortness of breath: Secondary | ICD-10-CM | POA: Diagnosis not present

## 2022-03-13 DIAGNOSIS — J449 Chronic obstructive pulmonary disease, unspecified: Secondary | ICD-10-CM | POA: Diagnosis not present

## 2022-03-13 DIAGNOSIS — Z7951 Long term (current) use of inhaled steroids: Secondary | ICD-10-CM

## 2022-03-13 DIAGNOSIS — M109 Gout, unspecified: Secondary | ICD-10-CM | POA: Diagnosis not present

## 2022-03-13 DIAGNOSIS — N184 Chronic kidney disease, stage 4 (severe): Secondary | ICD-10-CM | POA: Diagnosis present

## 2022-03-13 DIAGNOSIS — I2721 Secondary pulmonary arterial hypertension: Secondary | ICD-10-CM | POA: Diagnosis not present

## 2022-03-13 LAB — CBC
HCT: 39.4 % (ref 36.0–46.0)
Hemoglobin: 12.2 g/dL (ref 12.0–15.0)
MCH: 30.9 pg (ref 26.0–34.0)
MCHC: 31 g/dL (ref 30.0–36.0)
MCV: 99.7 fL (ref 80.0–100.0)
Platelets: 206 10*3/uL (ref 150–400)
RBC: 3.95 MIL/uL (ref 3.87–5.11)
RDW: 15.6 % — ABNORMAL HIGH (ref 11.5–15.5)
WBC: 6.1 10*3/uL (ref 4.0–10.5)
nRBC: 0 % (ref 0.0–0.2)

## 2022-03-13 LAB — BASIC METABOLIC PANEL
Anion gap: 12 (ref 5–15)
BUN: 35 mg/dL — ABNORMAL HIGH (ref 8–23)
CO2: 26 mmol/L (ref 22–32)
Calcium: 9 mg/dL (ref 8.9–10.3)
Chloride: 105 mmol/L (ref 98–111)
Creatinine, Ser: 1.37 mg/dL — ABNORMAL HIGH (ref 0.44–1.00)
GFR, Estimated: 39 mL/min — ABNORMAL LOW (ref >60)
Glucose, Bld: 143 mg/dL — ABNORMAL HIGH (ref 70–99)
Potassium: 3.5 mmol/L (ref 3.5–5.1)
Sodium: 143 mmol/L (ref 135–145)

## 2022-03-13 LAB — MAGNESIUM: Magnesium: 1.9 mg/dL (ref 1.7–2.4)

## 2022-03-13 LAB — BRAIN NATRIURETIC PEPTIDE: B Natriuretic Peptide: >4500 pg/mL — ABNORMAL HIGH (ref 0.0–100.0)

## 2022-03-13 LAB — TROPONIN I (HIGH SENSITIVITY)
Troponin I (High Sensitivity): 76 ng/L — ABNORMAL HIGH (ref <18)
Troponin I (High Sensitivity): 94 ng/L — ABNORMAL HIGH (ref <18)

## 2022-03-13 MED ORDER — POLYETHYLENE GLYCOL 3350 17 G PO PACK: 17.0000 g | PACK | Freq: Every day | ORAL | Status: DC | PRN

## 2022-03-13 MED ORDER — FAMOTIDINE 20 MG PO TABS
20.0000 mg | ORAL_TABLET | Freq: Every day | ORAL | Status: DC
  Administered 2022-03-13 – 2022-03-19 (×7): 20 mg via ORAL
  Filled 2022-03-13 (×7): qty 1

## 2022-03-13 MED ORDER — FUROSEMIDE 10 MG/ML IJ SOLN
40.0000 mg | Freq: Once | INTRAMUSCULAR | Status: AC
  Administered 2022-03-13: 40 mg via INTRAVENOUS
  Filled 2022-03-13: qty 4

## 2022-03-13 MED ORDER — HEPARIN SODIUM (PORCINE) 5000 UNIT/ML IJ SOLN
5000.0000 [IU] | Freq: Three times a day (TID) | INTRAMUSCULAR | Status: DC
  Administered 2022-03-13 – 2022-03-20 (×20): 5000 [IU] via SUBCUTANEOUS
  Filled 2022-03-13 (×20): qty 1

## 2022-03-13 MED ORDER — UMECLIDINIUM BROMIDE 62.5 MCG/ACT IN AEPB: 1.0000 | INHALATION_SPRAY | Freq: Every day | RESPIRATORY_TRACT | Status: DC

## 2022-03-13 MED ORDER — UMECLIDINIUM BROMIDE 62.5 MCG/ACT IN AEPB
1.0000 | INHALATION_SPRAY | Freq: Every day | RESPIRATORY_TRACT | Status: DC
  Administered 2022-03-14 – 2022-03-20 (×6): 1 via RESPIRATORY_TRACT
  Filled 2022-03-13 (×2): qty 7

## 2022-03-13 MED ORDER — ZOLPIDEM TARTRATE 5 MG PO TABS
5.0000 mg | ORAL_TABLET | Freq: Every evening | ORAL | Status: DC | PRN
  Administered 2022-03-14 – 2022-03-19 (×5): 5 mg via ORAL
  Filled 2022-03-13 (×5): qty 1

## 2022-03-13 MED ORDER — ATORVASTATIN CALCIUM 10 MG PO TABS: 10.0000 mg | ORAL_TABLET | Freq: Every day | ORAL | 2 refills | Status: DC

## 2022-03-13 MED ORDER — ALLOPURINOL 300 MG PO TABS
150.0000 mg | ORAL_TABLET | Freq: Every day | ORAL | Status: DC
  Administered 2022-03-14 – 2022-03-20 (×7): 150 mg via ORAL
  Filled 2022-03-13 (×7): qty 1

## 2022-03-13 MED ORDER — ATORVASTATIN CALCIUM 10 MG PO TABS
10.0000 mg | ORAL_TABLET | Freq: Every day | ORAL | Status: DC
  Administered 2022-03-13 – 2022-03-20 (×8): 10 mg via ORAL
  Filled 2022-03-13 (×8): qty 1

## 2022-03-13 MED ORDER — FLUTICASONE FUROATE-VILANTEROL 100-25 MCG/ACT IN AEPB
1.0000 | INHALATION_SPRAY | Freq: Every day | RESPIRATORY_TRACT | Status: DC
  Administered 2022-03-14 – 2022-03-20 (×6): 1 via RESPIRATORY_TRACT
  Filled 2022-03-13 (×2): qty 28

## 2022-03-13 MED ORDER — FUROSEMIDE 10 MG/ML IJ SOLN
40.0000 mg | Freq: Two times a day (BID) | INTRAMUSCULAR | Status: DC
  Administered 2022-03-14: 40 mg via INTRAVENOUS
  Filled 2022-03-13: qty 4

## 2022-03-13 MED ORDER — ACETAMINOPHEN 650 MG RE SUPP: 650.0000 mg | Freq: Four times a day (QID) | RECTAL | Status: DC | PRN

## 2022-03-13 MED ORDER — HYDROCODONE-ACETAMINOPHEN 5-325 MG PO TABS
1.0000 | ORAL_TABLET | Freq: Three times a day (TID) | ORAL | Status: DC | PRN
  Administered 2022-03-13 – 2022-03-20 (×12): 1 via ORAL
  Filled 2022-03-13 (×12): qty 1

## 2022-03-13 MED ORDER — ACETAMINOPHEN 325 MG PO TABS: 650.0000 mg | ORAL_TABLET | Freq: Four times a day (QID) | ORAL | Status: DC | PRN

## 2022-03-13 MED ORDER — POTASSIUM CHLORIDE CRYS ER 20 MEQ PO TBCR
40.0000 meq | EXTENDED_RELEASE_TABLET | Freq: Once | ORAL | Status: AC
  Administered 2022-03-13: 40 meq via ORAL
  Filled 2022-03-13: qty 2

## 2022-03-13 MED ORDER — ISOSORBIDE MONONITRATE ER 60 MG PO TB24
30.0000 mg | ORAL_TABLET | Freq: Every day | ORAL | Status: DC
  Administered 2022-03-14: 30 mg via ORAL
  Filled 2022-03-13: qty 1

## 2022-03-13 MED ORDER — ALBUTEROL SULFATE (2.5 MG/3ML) 0.083% IN NEBU: 2.5000 mg | INHALATION_SOLUTION | Freq: Four times a day (QID) | RESPIRATORY_TRACT | Status: DC | PRN

## 2022-03-13 MED ORDER — BISOPROLOL FUMARATE 5 MG PO TABS
2.5000 mg | ORAL_TABLET | Freq: Every day | ORAL | Status: DC
  Administered 2022-03-14: 2.5 mg via ORAL
  Filled 2022-03-13: qty 1

## 2022-03-13 MED ORDER — ALBUTEROL SULFATE HFA 108 (90 BASE) MCG/ACT IN AERS: 2.0000 | INHALATION_SPRAY | Freq: Four times a day (QID) | RESPIRATORY_TRACT | Status: DC | PRN

## 2022-03-13 MED ORDER — FLUTICASONE FUROATE-VILANTEROL 100-25 MCG/ACT IN AEPB: 1.0000 | INHALATION_SPRAY | Freq: Every day | RESPIRATORY_TRACT | Status: DC

## 2022-03-13 NOTE — H&P (Signed)
History and Physical    Heather [name] ACZ:[id number] DOB: 07-20-1940 DOA: 03/13/2022  PCP: [name], MD   Patient coming from: Home  I have personally briefly reviewed patient's old medical records in [facility]  Chief Complaint: Leg swelling  HPI: Heather [name] is a 82 y.o. female with medical history significant for congestive heart failure, hypertension, COPD, OSA, CVA, CKD 3. Patient presented to the ED with complaints of bilateral lower extremity swelling-that has progressed  over the past month.  Manage takes 20 mg of Lasix, but dose has gradually been increased to 60 mg. She has been taking 60 mg daily for the past week without improvement.  Her thighs also feel swollen.  She reports difficulty breathing with exertion.  No chest pain.  ED Course: O2 sat 96% on room air.  Stable vitals.  BNP greater than 4500.  Chest x-ray shows bilateral pleural effusions, left lung base opacity, mild central vascular congestion.  EDP talked to patient's cardiologist, recommended admission. IV Lasix 40 mg x 1 given.  Review of Systems: As per HPI all other systems reviewed and negative.  Past Medical History:  Diagnosis Date   Anemia    Anxiety    Arthritis    COPD (chronic obstructive pulmonary disease) (HCC)    CVA (cerebral vascular accident) (HCC)    Depression    Dry eyes 04/14/2014   GERD (gastroesophageal reflux disease)    Glaucoma    Gout    HTN (hypertension)    OSA (obstructive sleep apnea)     Past Surgical History:  Procedure Laterality Date   ABDOMINAL HYSTERECTOMY     BACK SURGERY     lumbar-disc   BUNIONECTOMY Bilateral    FOOT SURGERY Left    repair of "kissing Cousins"   JOINT REPLACEMENT Bilateral    hip    JOINT REPLACEMENT Right 2010 or 2012   knee   KNEE SURGERY     RIGHT/LEFT HEART CATH AND CORONARY ANGIOGRAPHY N/A 10/27/2021   Procedure: RIGHT/LEFT HEART CATH AND CORONARY ANGIOGRAPHY;  Surgeon: Corky Crafts, MD;  [facility]: [facility]  IN[facility];  Service: Cardiovascular;  Laterality: N/A;   SHOULDER ARTHROSCOPY Right    shoulder surgery in [place] about 2000   SHOULDER HEMI-ARTHROPLASTY Left 03/25/2014   Procedure: LEFT SHOULDER HEMI-ARTHROPLASTY;  Surgeon: Vickki Hearing, MD;  Location: [facility];  Service: Orthopedics;  Laterality: Left;     reports that she quit smoking about 37 years ago. Her smoking use included cigarettes. She has a 40.00 pack-year smoking history. She has never used smokeless tobacco. She reports that she does not drink alcohol and does not use drugs.  Allergies  Allergen Reactions   Lovastatin Swelling    Patient states that her tongue swells and she gets a tingling feeling all over. Patient states that her tongue swells and she gets a tingling feeling all over.   Lisinopril     Family History  Problem Relation Age of Onset   High blood pressure Mother    Aneurysm Father    Diabetes Brother    Pancreatitis Brother    Hypertension Niece    Congestive Heart Failure Niece     Prior to Admission medications   Medication Sig Start Date End Date Taking? Authorizing Provider  acetaminophen (TYLENOL) 500 MG tablet Take 500 mg by mouth every 6 (six) hours as needed.   Yes [provider]  allopurinol (ZYLOPRIM) 300 MG tablet Take 0.5 tablets (150 mg  total) by mouth daily. 11/03/21  Yes [name], MD  atorvastatin (LIPITOR) 10 MG tablet Take 1 tablet (10 mg total) by mouth daily. 03/13/22  Yes [name], MD  bisoprolol (ZEBETA) 5 MG tablet Take 0.5 tablets (2.5 mg total) by mouth daily. 01/05/22  Yes O'Neal, Ronnald Ramp, MD  chlorpheniramine (CHLOR-TRIMETON) 4 MG tablet Take 4 mg by mouth daily as needed for allergies.   Yes [provider]  Cholecalciferol (DIALYVITE VITAMIN D 5000 PO) Take by mouth.   Yes [provider]  dapagliflozin propanediol (FARXIGA) 10 MG TABS tablet Take 1 tablet (10 mg total) by mouth daily. 01/05/22  Yes O'Neal, Ronnald Ramp, MD  famotidine (PEPCID) 20 MG tablet Take 1 tablet (20 mg total) by mouth at bedtime. One after supper 10/10/21  Yes Vassie Loll, MD  Fluticasone-Umeclidin-Vilant (TRELEGY ELLIPTA) 100-62.5-25 MCG/ACT AEPB Inhale 1 puff into the lungs daily. 11/14/21  Yes Coralyn Helling, MD  furosemide (LASIX) 40 MG tablet Take 0.5 tablets (20 mg total) by mouth daily. 02/02/22  Yes [name], MD  HYDROcodone-acetaminophen (NORCO/VICODIN) 5-325 MG tablet Take 1 tablet by mouth every 6 (six) hours as needed for moderate pain.   Yes [provider]  isosorbide mononitrate (IMDUR) 30 MG 24 hr tablet Take 1 tablet (30 mg total) by mouth daily. 01/05/22  Yes O'Neal, Ronnald Ramp, MD  ketoconazole (NIZORAL) 2 % cream Apply 1 application topically daily. 01/18/21  Yes Louann Sjogren, DPM  Multiple Vitamins-Minerals (MULTIVITAMIN WITH MINERALS) tablet Take 1 tablet by mouth daily.   Yes [provider]  PROAIR HFA 108 (90 Base) MCG/ACT inhaler Inhale 2 puffs into the lungs every 6 (six) hours as needed for wheezing or shortness of breath. 10/10/21  Yes Vassie Loll, MD  venlafaxine XR (EFFEXOR-XR) 37.5 MG 24 hr capsule Take 37.5 mg by mouth See admin instructions. Take 2 capsules by mouth daily 11/29/20  Yes [provider]  zolpidem (AMBIEN) 10 MG tablet TAKE (1) TABLET BY MOUTH AT BEDTIME AS NEEDED FOR SLEEP Patient taking differently: Take 10 mg by mouth at bedtime as needed for sleep. 11/16/21  Yes Coralyn Helling, MD    Physical Exam: Vitals:   03/13/22 1159  BP: 121/72  Pulse: 85  Resp: 20  Temp: 97.7 F (36.5 C)  TempSrc: Oral  SpO2: 96%    Constitutional: NAD, calm, comfortable Vitals:   03/13/22 1159  BP: 121/72  Pulse: 85  Resp: 20  Temp: 97.7 F (36.5 C)  TempSrc: Oral  SpO2: 96%   Eyes: PERRL, lids and conjunctivae normal ENMT: Mucous membranes are moist.  Neck: normal, supple, no masses, no thyromegaly Respiratory: clear to auscultation bilaterally, no  wheezing, no crackles. Normal respiratory effort. No accessory muscle use.  Cardiovascular: Regular rate and rhythm, no murmurs / rubs / gallops.  3 + pitting bilateral lower extremity edema to knees   Abdomen: no tenderness, no masses palpated. No hepatosplenomegaly. Bowel sounds positive.  Musculoskeletal: no clubbing / cyanosis. No joint deformity upper and lower extremities. Skin: no rashes, lesions, ulcers. No induration Neurologic: No apparent cranial nerve abnormality, moving extremities spontaneously.  Psychiatric: Normal judgment and insight. Alert and oriented x 3. Normal mood.   Labs on Admission: I have personally reviewed following labs and imaging studies  CBC: Recent Labs  Lab 03/13/22 1314  WBC 6.1  HGB 12.2  HCT 39.4  MCV 99.7  PLT 206   Basic Metabolic Panel: Recent Labs  Lab 03/13/22 1314  NA 143  K 3.5  CL 105  CO2 26  GLUCOSE 143*  BUN 35*  CREATININE 1.37*  CALCIUM 9.0    Radiological Exams on Admission: DG Chest 2 View  Result Date: 03/13/2022 CLINICAL DATA:  Swelling in legs.  Shortness of breath EXAM: CHEST - 2 VIEW COMPARISON:  10/07/2021 and older FINDINGS: Small bilateral pleural effusion with some adjacent opacity. No pneumothorax or edema. Enlarged cardiopericardial silhouette. Mild vascular congestion. Calcified tortuous aorta. Surgical changes identified with bird's along the right humeral head and left shoulder arthroplasty. IMPRESSION: Small pleural effusions with some adjacent opacity at the left lung base, similar to the prior x-ray. Mild vascular central congestion. Electronically Signed   By: Jill Side M.D.   On: 03/13/2022 12:35    EKG: Independently reviewed.  Sinus rhythm, rate 87, QTc 536.  Old LBBB.  Unchanged from prior.  Assessment/Plan Principal Problem:   Acute on chronic systolic CHF (congestive heart failure) (HCC) Active Problems:   Stage 3a chronic kidney disease (HCC)   Essential hypertension   Gout   COPD GOLD 1     OSA on CPAP   Prolonged QT interval   Assessment and Plan: * Acute on chronic systolic CHF (congestive heart failure) (HCC) 3+ pitting bilateral lower extremity edema, chest x-ray showing mild central vascular congestion and small pleural effusions.  BNP markedly elevated at greater than 4500.  Recent echo 02/2022, EF of less than 20%.  On 20 mg increased>>> 60 mg Lasix without improvement. -IV Lasix 40 mg twice daily -Check input output, daily weights, daily BMP -Hold Farxiga while inpatient  Stage 3a chronic kidney disease ([place]) 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 At baseline.  Prolonged QT interval QTc 536.  Potassium 3.5. -Replete K, check magnesium -Hold venlafaxine  COPD GOLD 1  Stable. -Resume home bronchodilator regimen.  Gout Resume allopurinol  Essential hypertension Stable. -Resume bisoprolol, Imdur   DVT prophylaxis: Lovenox Code Status: Full code Family Communication: None at bedside Disposition Plan: > 2 days Consults called:  None Admission status: Inpt tele I certify that at the point of admission it is my clinical judgment that the patient will require inpatient hospital care spanning beyond 2 midnights from the point of admission due to high intensity of service, high risk for further deterioration and high frequency of surveillance required.  Author: Bethena Roys, MD 03/13/2022 6:46 PM  For on call review [email or web address].

## 2022-03-13 NOTE — Telephone Encounter (Signed)
Prescription Request  03/13/2022  Is this a "Controlled Substance" medicine? No  LOV: 02/02/2022  What is the name of the medication or equipment? ATORVASTATIN 10 MG TABLET   Have you contacted your pharmacy to request a refill? No   Which pharmacy would you like this sent to?  [facility], [address] [place] [address] Phone: [phone] Fax: [phone]  [facility] [place], [address] [place] [address] Phone: [phone] Fax: [phone]    Patient notified that their request is being sent to the clinical staff for review and that they should receive a response within 2 business days.   Please advise at [facility] [phone]

## 2022-03-13 NOTE — ED Provider Notes (Signed)
[facility] EMERGENCY DEPARTMENT Provider Note   CSN: [id number] Arrival date & time: 03/13/22  1133     History  Chief Complaint  Patient presents with   Leg Swelling    Heather [name] is a 82 y.o. female.  HPI Patient presents with swelling of both legs.  History of CHF.  Has had her weight creeping up.  States her weight was down in the 150s but now up to 170s.  Has been on Lasix.  States her doctors have increased it from 20-day and now it sounds as if it is up to 60 a day.  No chest pain.  States she has been more sleepy.  Nose fevers.  No chills.  States she keeps her feet elevated at night.  States that used to help but now stopped helping.   Past Medical History:  Diagnosis Date   Anemia    Anxiety    Arthritis    COPD (chronic obstructive pulmonary disease) (HCC)    CVA (cerebral vascular accident) (HCC)    Depression    Dry eyes 04/14/2014   GERD (gastroesophageal reflux disease)    Glaucoma    Gout    HTN (hypertension)    OSA (obstructive sleep apnea)     Home Medications Prior to Admission medications   Medication Sig Start Date End Date Taking? Authorizing Provider  allopurinol (ZYLOPRIM) 300 MG tablet Take 0.5 tablets (150 mg total) by mouth daily. 11/03/21   [name], MD  atorvastatin (LIPITOR) 10 MG tablet Take 1 tablet (10 mg total) by mouth daily. 03/13/22   [name], MD  bisoprolol (ZEBETA) 5 MG tablet Take 0.5 tablets (2.5 mg total) by mouth daily. 01/05/22   O'Neal, Ronnald Ramp, MD  chlorpheniramine (CHLOR-TRIMETON) 4 MG tablet Take 4 mg by mouth 2 (two) times daily as needed for allergies.    [provider]  Cholecalciferol (DIALYVITE VITAMIN D 5000 PO) Take by mouth.    [provider]  dapagliflozin propanediol (FARXIGA) 10 MG TABS tablet Take 1 tablet (10 mg total) by mouth daily. 01/05/22   O'NealRonnald Ramp, MD  famotidine (PEPCID) 20 MG tablet Take 1 tablet (20 mg total) by mouth at bedtime. One after supper  10/10/21   Vassie Loll, MD  Fluticasone-Umeclidin-Vilant (TRELEGY ELLIPTA) 100-62.5-25 MCG/ACT AEPB Inhale 1 puff into the lungs daily. 11/14/21   Coralyn Helling, MD  furosemide (LASIX) 40 MG tablet Take 0.5 tablets (20 mg total) by mouth daily. 02/02/22   [name], MD  HYDROcodone-acetaminophen (NORCO/VICODIN) 5-325 MG tablet Take 1 tablet by mouth every 6 (six) hours as needed for moderate pain.    [provider]  isosorbide mononitrate (IMDUR) 30 MG 24 hr tablet Take 1 tablet (30 mg total) by mouth daily. 01/05/22   O'NealRonnald Ramp, MD  ketoconazole (NIZORAL) 2 % cream Apply 1 application topically daily. 01/18/21   Louann Sjogren, DPM  Multiple Vitamins-Minerals (MULTIVITAMIN WITH MINERALS) tablet Take 1 tablet by mouth daily.    [provider]  PROAIR HFA 108 [phone] Base) MCG/ACT inhaler Inhale 2 puffs into the lungs every 6 (six) hours as needed for wheezing or shortness of breath. 10/10/21   Vassie Loll, MD  venlafaxine XR (EFFEXOR-XR) 37.5 MG 24 hr capsule Take 37.5 mg by mouth See admin instructions. Take 2 capsules by mouth daily 11/29/20   [provider]  zolpidem (AMBIEN) 10 MG tablet TAKE (1) TABLET BY MOUTH AT BEDTIME AS NEEDED FOR SLEEP 11/16/21  Chesley Mires, MD      Allergies    Lovastatin and Lisinopril    Review of Systems   Review of Systems  Physical Exam Updated Vital Signs BP 121/72 (BP Location: Right Arm)   Pulse 85   Temp 97.7 F (36.5 C) (Oral)   Resp 20   SpO2 96%  Physical Exam Vitals and nursing note reviewed.  Eyes:     Pupils: Pupils are equal, round, and reactive to light.  Cardiovascular:     Rate and Rhythm: Regular rhythm.  Abdominal:     Tenderness: There is no abdominal tenderness.  Musculoskeletal:     Cervical back: Neck supple.     Comments: Moderate pitting edema bilateral lower extremities.  Goes to above the knees.  No erythema.  No induration.  Skin:    Capillary Refill: Capillary refill takes  less than 2 seconds.  Neurological:     Mental Status: She is alert and oriented to person, place, and time.     ED Results / Procedures / Treatments   Labs (all labs ordered are listed, but only abnormal results are displayed) Labs Reviewed  BASIC METABOLIC PANEL - Abnormal; Notable for the following components:      Result Value   Glucose, Bld 143 (*)    BUN 35 (*)    Creatinine, Ser 1.37 (*)    GFR, Estimated 39 (*)    All other components within normal limits  CBC - Abnormal; Notable for the following components:   RDW 15.6 (*)    All other components within normal limits  BRAIN NATRIURETIC PEPTIDE - Abnormal; Notable for the following components:   B Natriuretic Peptide >4,500.0 (*)    All other components within normal limits  TROPONIN I (HIGH SENSITIVITY) - Abnormal; Notable for the following components:   Troponin I (High Sensitivity) 76 (*)    All other components within normal limits    EKG EKG Interpretation  Date/Time:  Monday March 13 2022 12:05:05 EST Ventricular Rate:  87 PR Interval:  182 QRS Duration: 180 QT Interval:  446 QTC Calculation: 536 R Axis:   131 Text Interpretation: Patient Normal sinus rhythm Possible Left atrial enlargement Right axis deviation Left bundle branch block Abnormal ECG When compared with ECG of 07-Oct-2021 12:42, No significant change since last tracing Confirmed by Davonna Belling [phone]) on 03/13/2022 2:56:41 PM  Radiology DG Chest 2 View  Result Date: 03/13/2022 CLINICAL DATA:  Swelling in legs.  Shortness of breath EXAM: CHEST - 2 VIEW COMPARISON:  10/07/2021 and older FINDINGS: Small bilateral pleural effusion with some adjacent opacity. No pneumothorax or edema. Enlarged cardiopericardial silhouette. Mild vascular congestion. Calcified tortuous aorta. Surgical changes identified with bird's along the right humeral head and left shoulder arthroplasty. IMPRESSION: Small pleural effusions with some adjacent opacity at the left  lung base, similar to the prior x-ray. Mild vascular central congestion. Electronically Signed   By: Jill Side M.D.   On: 03/13/2022 12:35    Procedures Procedures    Medications Ordered in ED Medications  furosemide (LASIX) injection 40 mg (has no administration in time range)    ED Course/ Medical Decision Making/ A&P                           Medical Decision Making Amount and/or Complexity of Data Reviewed Labs: ordered. Radiology: ordered.  Risk Prescription drug management.   Patient creasing swelling of both her legs.  Is been  going for over the last month.  History of CHF.  Has been having increasing Lasix at home.  No shortness of breath.  No fevers.  Differential diagnosis includes CHF, DVT, cellulitis. know I think most likely CHF.  No skin changes.  No erythema.  Do not think any Doppler to rule out DVT.  Will get BNP.  Chest x-ray reassuring although does show some mild volume overload.  Not hypoxic.  Discussed with Dr. Dellia Cloud.  She recommends admission to hospital.  Has been on increasing doses of Lasix without improvement.  BNP is elevated to more than 4500.  Creatinine mildly increased from baseline.  X-ray does show some mild volume overload.  Will discuss with hospitalist for admission.          Final Clinical Impression(s) / ED Diagnoses Final diagnoses:  Acute on chronic congestive heart failure, unspecified heart failure type [facility])    Rx / DC Orders ED Discharge Orders     None         Davonna Belling, MD 03/13/22 1534

## 2022-03-13 NOTE — Assessment & Plan Note (Addendum)
3+ pitting bilateral lower extremity edema, chest x-ray showing mild central vascular congestion and small pleural effusions.  BNP markedly elevated at greater than 4500.  Recent echo 02/2022, EF of less than 20%.  On 20 mg increased>>> 60 mg Lasix without improvement. -IV Lasix 40 mg twice daily -Check input output, daily weights, daily BMP -Hold Farxiga while inpatient

## 2022-03-13 NOTE — Telephone Encounter (Signed)
Refills sent

## 2022-03-13 NOTE — ED Triage Notes (Signed)
Pt c/o leg swelling x 1 month. Hx of CHF. Ble from knees down are moderately to severely swollen and mildy tight. No tightness noted to thighs or abd. No resp distress and pt ambulatory with no sob. A/o.

## 2022-03-13 NOTE — Assessment & Plan Note (Signed)
Stable. -Resume home bronchodilator regimen. 

## 2022-03-13 NOTE — Assessment & Plan Note (Signed)
Stable. -Resume bisoprolol, Imdur

## 2022-03-13 NOTE — Assessment & Plan Note (Addendum)
QTc 536.  Potassium 3.5. -Replete K, check magnesium -Hold venlafaxine

## 2022-03-13 NOTE — Assessment & Plan Note (Addendum)
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 At baseline.

## 2022-03-13 NOTE — Assessment & Plan Note (Signed)
Resume allopurinol 

## 2022-03-14 DIAGNOSIS — I272 Pulmonary hypertension, unspecified: Secondary | ICD-10-CM

## 2022-03-14 DIAGNOSIS — N1831 Chronic kidney disease, stage 3a: Secondary | ICD-10-CM | POA: Diagnosis not present

## 2022-03-14 DIAGNOSIS — I5023 Acute on chronic systolic (congestive) heart failure: Secondary | ICD-10-CM | POA: Diagnosis not present

## 2022-03-14 DIAGNOSIS — Z8673 Personal history of transient ischemic attack (TIA), and cerebral infarction without residual deficits: Secondary | ICD-10-CM

## 2022-03-14 LAB — BASIC METABOLIC PANEL
Anion gap: 10 (ref 5–15)
Anion gap: 10 (ref 5–15)
BUN: 35 mg/dL — ABNORMAL HIGH (ref 8–23)
BUN: 35 mg/dL — ABNORMAL HIGH (ref 8–23)
CO2: 26 mmol/L (ref 22–32)
CO2: 28 mmol/L (ref 22–32)
Calcium: 8.5 mg/dL — ABNORMAL LOW (ref 8.9–10.3)
Calcium: 8.8 mg/dL — ABNORMAL LOW (ref 8.9–10.3)
Chloride: 106 mmol/L (ref 98–111)
Chloride: 103 mmol/L (ref 98–111)
Creatinine, Ser: 1.31 mg/dL — ABNORMAL HIGH (ref 0.44–1.00)
Creatinine, Ser: 1.6 mg/dL — ABNORMAL HIGH (ref 0.44–1.00)
GFR, Estimated: 41 mL/min — ABNORMAL LOW (ref >60)
GFR, Estimated: 32 mL/min — ABNORMAL LOW (ref >60)
Glucose, Bld: 98 mg/dL (ref 70–99)
Glucose, Bld: 101 mg/dL — ABNORMAL HIGH (ref 70–99)
Potassium: 3.4 mmol/L — ABNORMAL LOW (ref 3.5–5.1)
Potassium: 4.1 mmol/L (ref 3.5–5.1)
Sodium: 142 mmol/L (ref 135–145)
Sodium: 141 mmol/L (ref 135–145)

## 2022-03-14 MED ORDER — FUROSEMIDE 10 MG/ML IJ SOLN
20.0000 mg/h | INTRAVENOUS | Status: DC
  Administered 2022-03-14 – 2022-03-15 (×2): 10 mg/h via INTRAVENOUS
  Administered 2022-03-15: 15 mg/h via INTRAVENOUS
  Administered 2022-03-16 – 2022-03-17 (×3): 20 mg/h via INTRAVENOUS
  Filled 2022-03-14 (×8): qty 20

## 2022-03-14 MED ORDER — CYCLOBENZAPRINE HCL 10 MG PO TABS
5.0000 mg | ORAL_TABLET | Freq: Once | ORAL | Status: AC
  Administered 2022-03-14: 5 mg via ORAL
  Filled 2022-03-14: qty 1

## 2022-03-14 MED ORDER — LIVING BETTER WITH HEART FAILURE BOOK: Freq: Once | Status: AC

## 2022-03-14 MED ORDER — ASPIRIN 81 MG PO CHEW
81.0000 mg | CHEWABLE_TABLET | Freq: Every day | ORAL | Status: DC
  Administered 2022-03-14 – 2022-03-20 (×7): 81 mg via ORAL
  Filled 2022-03-14 (×7): qty 1

## 2022-03-14 MED ORDER — POTASSIUM CHLORIDE 20 MEQ PO PACK
40.0000 meq | PACK | Freq: Once | ORAL | Status: AC
  Administered 2022-03-14: 40 meq via ORAL
  Filled 2022-03-14: qty 2

## 2022-03-14 MED ORDER — PHENOL 1.4 % MT LIQD
1.0000 | OROMUCOSAL | Status: DC | PRN
  Administered 2022-03-16 (×2): 1 via OROMUCOSAL
  Filled 2022-03-14 (×2): qty 177

## 2022-03-14 MED ORDER — ALUM & MAG HYDROXIDE-SIMETH 200-200-20 MG/5ML PO SUSP: 30.0000 mL | Freq: Four times a day (QID) | ORAL | Status: DC | PRN

## 2022-03-14 NOTE — Consult Note (Signed)
Cardiology Consultation   Patient ID: COREENA [name] MRN: [id number]; DOB: 1940/06/09  Admit date: 03/13/2022 Date of Consult: 03/14/2022  PCP:  [name], MD   [facility] Providers Cardiologist:  Reatha Harps, MD        Patient Profile:   Heather [name] is a 82 y.o. female with a hx of hypertension, chronic LBBB, COPD, prior CVA, HFrEF, OSA, CKD III, and GERD who is being seen 03/14/2022 for the evaluation of acute on chronic HFrEF at the request of Dr. Rubin Payor.  History of Present Illness:   Heather [name] is a 82 year old female with the previously mentioned past medical histoyr of HTN, Chronic LBBB on EKG, COPD, prior CVA, HFrEF with severely reduced LVEF of 15-20%, OSA with CPAP, and CKD III.   Previous echocardiogram in 10/07/2021 revealed reduced LVEF of 15-20%, global hypokinesis, moderately elevated pulmonary artery systolic pressure 49 mmHg, moderately dilated left atrium, mild to moderate mitral valve regurgitation. 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  It was recommended with medical therapy and she was started on low dose of diuretics.  She presented to the [facility] emergency department on 03/13/2022 with the complaint of leg swelling bilaterally. She has a known history of congestive heart failure and had previously had her furosemide dosing increased to 60 mg daily without improvement. Denies any chest pain or shortness of breath, fevers, chills, but does endorse increasing swelling, fatigue, and sleepiness.  She does endorse dyspnea on exertion for approximately the past month.  Initial vital signs: blood pressure 121/72, pulse 85, respirations 20, temp 97.7  Pertinent labs: serum creatinine 1.37, BUN 35, BNP  >45000, HS troponin 76 and 94  Imaging: X-ray shows small pleural effusion with some adjacent opacity in the left lung base, similar to prior x-ray, mild vascular central congestion  Medications administered in the emergency department: Furosemide 40 mg IVP   Past Medical History:  Diagnosis Date   Anemia    Anxiety    Arthritis    COPD (chronic obstructive pulmonary disease) (HCC)    CVA (cerebral vascular accident) (HCC)    Depression    Dry eyes 04/14/2014   GERD (gastroesophageal reflux disease)    Glaucoma    Gout    HTN (hypertension)    OSA (obstructive sleep apnea)     Past Surgical History:  Procedure Laterality Date   ABDOMINAL HYSTERECTOMY     BACK SURGERY     lumbar-disc   BUNIONECTOMY Bilateral    FOOT SURGERY Left    repair of "kissing Cousins"   JOINT REPLACEMENT Bilateral    hip    JOINT REPLACEMENT Right 2010 or 2012   knee   KNEE SURGERY     RIGHT/LEFT HEART CATH AND CORONARY ANGIOGRAPHY N/A 10/27/2021   Procedure: RIGHT/LEFT HEART CATH AND CORONARY ANGIOGRAPHY;  Surgeon: Corky Crafts, MD;  Location: [facility] INVASIVE CV LAB;  Service: Cardiovascular;  Laterality: N/A;   SHOULDER ARTHROSCOPY Right    shoulder surgery in [place] about 2000   SHOULDER HEMI-ARTHROPLASTY Left 03/25/2014   Procedure: LEFT SHOULDER HEMI-ARTHROPLASTY;  Surgeon: Vickki Hearing, MD;  Location: [facility] ORS;  Service: Orthopedics;  Laterality: Left;     Home Medications:  Prior to Admission medications   Medication Sig Start Date End Date  Taking? Authorizing Provider  acetaminophen (TYLENOL) 500 MG tablet Take 500 mg by mouth every 6 (six) hours as needed.   Yes [provider]  allopurinol (ZYLOPRIM) 300 MG tablet Take 0.5 tablets (150 mg total) by mouth daily. 11/03/21  Yes [name], MD  atorvastatin (LIPITOR) 10 MG tablet Take 1 tablet (10 mg total) by mouth daily. 03/13/22  Yes [name], MD  bisoprolol (ZEBETA) 5 MG tablet Take 0.5 tablets (2.5 mg  total) by mouth daily. 01/05/22  Yes O'Neal, Ronnald Ramp, MD  chlorpheniramine (CHLOR-TRIMETON) 4 MG tablet Take 4 mg by mouth daily as needed for allergies.   Yes [provider]  Cholecalciferol (DIALYVITE VITAMIN D 5000 PO) Take by mouth.   Yes [provider]  dapagliflozin propanediol (FARXIGA) 10 MG TABS tablet Take 1 tablet (10 mg total) by mouth daily. 01/05/22  Yes O'Neal, Ronnald Ramp, MD  famotidine (PEPCID) 20 MG tablet Take 1 tablet (20 mg total) by mouth at bedtime. One after supper 10/10/21  Yes Vassie Loll, MD  Fluticasone-Umeclidin-Vilant (TRELEGY ELLIPTA) 100-62.5-25 MCG/ACT AEPB Inhale 1 puff into the lungs daily. 11/14/21  Yes Coralyn Helling, MD  furosemide (LASIX) 40 MG tablet Take 0.5 tablets (20 mg total) by mouth daily. 02/02/22  Yes [name], MD  HYDROcodone-acetaminophen (NORCO/VICODIN) 5-325 MG tablet Take 1 tablet by mouth every 6 (six) hours as needed for moderate pain.   Yes [provider]  isosorbide mononitrate (IMDUR) 30 MG 24 hr tablet Take 1 tablet (30 mg total) by mouth daily. 01/05/22  Yes O'Neal, Ronnald Ramp, MD  ketoconazole (NIZORAL) 2 % cream Apply 1 application topically daily. 01/18/21  Yes Louann Sjogren, DPM  Multiple Vitamins-Minerals (MULTIVITAMIN WITH MINERALS) tablet Take 1 tablet by mouth daily.   Yes [provider]  PROAIR HFA 108 (90 Base) MCG/ACT inhaler Inhale 2 puffs into the lungs every 6 (six) hours as needed for wheezing or shortness of breath. 10/10/21  Yes Vassie Loll, MD  venlafaxine XR (EFFEXOR-XR) 37.5 MG 24 hr capsule Take 37.5 mg by mouth See admin instructions. Take 2 capsules by mouth daily 11/29/20  Yes [provider]  zolpidem (AMBIEN) 10 MG tablet TAKE (1) TABLET BY MOUTH AT BEDTIME AS NEEDED FOR SLEEP Patient taking differently: Take 10 mg by mouth at bedtime as needed for sleep. 11/16/21  Yes Coralyn Helling, MD    Inpatient Medications: Scheduled Meds:  allopurinol  150 mg  Oral Daily   atorvastatin  10 mg Oral Daily   bisoprolol  2.5 mg Oral Daily   famotidine  20 mg Oral QHS   fluticasone furoate-vilanterol  1 puff Inhalation Daily   And   umeclidinium bromide  1 puff Inhalation Daily   furosemide  40 mg Intravenous Q12H   heparin  5,000 Units Subcutaneous Q8H   isosorbide mononitrate  30 mg Oral Daily   Continuous Infusions:  PRN Meds: acetaminophen **OR** acetaminophen, albuterol, HYDROcodone-acetaminophen, polyethylene glycol, zolpidem  Allergies:    Allergies  Allergen Reactions   Lovastatin Swelling    Patient states that her tongue swells and she gets a tingling feeling all over. Patient states that her tongue swells and she gets a tingling feeling all over.   Lisinopril     Social History:   Social History   Socioeconomic History   Marital status: Divorced    Spouse name: Not on file   Number of children: Not on file   Years of education: Not on file   Highest  education level: Not on file  Occupational History   Not on file  Tobacco Use   Smoking status: Former    Packs/day: 1.00    Years: 40.00    Total pack years: 40.00    Types: Cigarettes    Quit date: 03/20/1985    Years since quitting: 37.0   Smokeless tobacco: Never  Vaping Use   Vaping Use: Never used  Substance and Sexual Activity   Alcohol use: No   Drug use: No   Sexual activity: Yes    Birth control/protection: Surgical  Other Topics Concern   Not on file  Social History Narrative   Mrs Livesay is a 82 year old divorced female who lives alone. She has support of her son and sister.    Social Determinants of Health   Financial Resource Strain: High Risk (11/08/2021)   Overall Financial Resource Strain (CARDIA)    Difficulty of Paying Living Expenses: Very hard  Food Insecurity: No Food Insecurity (03/13/2022)   Hunger Vital Sign    Worried About Running Out of Food in the Last Year: Never true    Ran Out of Food in the Last Year: Never true  Transportation  Needs: Unmet Transportation Needs (03/13/2022)   PRAPARE - Hydrologist (Medical): Yes    Lack of Transportation (Non-Medical): Yes  Physical Activity: Sufficiently Active (11/08/2021)   Exercise Vital Sign    Days of Exercise per Week: 7 days    Minutes of Exercise per Session: 40 min  Stress: No Stress Concern Present (11/08/2021)   [place]    Feeling of Stress : Not at all  Social Connections: Moderately Isolated (11/08/2021)   Social Connection and Isolation Panel [NHANES]    Frequency of Communication with Friends and Family: Twice a week    Frequency of Social Gatherings with Friends and Family: Once a week    Attends Religious Services: More than 4 times per year    Active Member of [employer] or Organizations: No    Attends Archivist Meetings: Never    Marital Status: Widowed  Intimate Partner Violence: Not At Risk (03/13/2022)   Humiliation, Afraid, Rape, and Kick questionnaire    Fear of Current or Ex-Partner: No    Emotionally Abused: No    Physically Abused: No    Sexually Abused: No    Family History:    Family History  Problem Relation Age of Onset   High blood pressure Mother    Aneurysm Father    Diabetes Brother    Pancreatitis Brother    Hypertension Niece    Congestive Heart Failure Niece      ROS:  Please see the history of present illness.  Review of Systems  Constitutional:  Positive for malaise/fatigue.  Respiratory:  Positive for shortness of breath.   Cardiovascular:  Positive for leg swelling.  Neurological:  Positive for weakness.    All other ROS reviewed and negative.     Physical Exam/Data:   Vitals:   03/13/22 2138 03/14/22 0151 03/14/22 0555 03/14/22 0750  BP: (!) 121/94 106/68 115/83   Pulse: 84 72 72   Resp: 18 18 18    Temp: 98.2 F (36.8 C) (!) 97.4 F (36.3 C) 97.6 F (36.4 C)   TempSrc: Oral Oral Oral   SpO2: 99% 98% 100% 92%   Weight:      Height:        Intake/Output Summary (  Last 24 hours) at 03/14/2022 0808 Last data filed at 03/14/2022 0626 Gross per 24 hour  Intake --  Output 480 ml  Net -480 ml      03/13/2022    9:12 PM 02/02/2022    9:52 AM 12/08/2021   11:03 AM  Last 3 Weights  Weight (lbs) 170 lb 3.1 oz 157 lb 3.2 oz 153 lb  Weight (kg) 77.2 kg 71.305 kg 69.4 kg     Body mass index is 30.15 kg/m.  General:  Well nourished, well developed, in no acute distress HEENT: normal Neck: no JVD Vascular: No carotid bruits; Distal pulses 2+ bilaterally Cardiac:  normal S1, S2; RRR; no murmur  Lungs:  clear upper lobes and coarse on the left with diminished bases  to auscultation bilaterally, respirations are unlabored at rest on room air Abd: soft, nontender, no hepatomegaly  Ext: 2-3+ edema to the bilateral lower extremities up to mid thigh Musculoskeletal:  No deformities, BUE and BLE strength normal and equal Skin: warm and dry  Neuro:  CNs 2-12 intact, no focal abnormalities noted Psych:  Normal affect   EKG:  The EKG was personally reviewed and demonstrates: Sinus rhythm with a rate of 87, LVH, right axis deviation, chronic left bundle branch block Telemetry:  Telemetry was personally reviewed and demonstrates: Sinus rates in the 70s, unifocal PVCs, chronic left bundle branch block  Relevant CV Studies:  TTE 02/15/22 1. Left ventricular ejection fraction, by estimation, is <20%. The left  ventricle has severely decreased function. The left ventricle has no  regional wall motion abnormalities. The left ventricular internal cavity  size was mildly dilated. Left  ventricular diastolic parameters are consistent with Grade II diastolic  dysfunction (pseudonormalization). There is the interventricular septum is  flattened in systole and diastole, consistent with right ventricular  pressure and volume overload.No  evidence of LV thrombus.   2. Right ventricular systolic function is moderately  reduced. The right  ventricular size is mildly enlarged. There is severely elevated pulmonary  artery systolic pressure.   3. Left atrial size was moderately dilated.   4. Right atrial size was moderately dilated.   5. The mitral valve is abnormal. Mild to moderate mitral valve  regurgitation. No evidence of mitral stenosis.   6. The aortic valve is abnormal. Aortic valve regurgitation is not  visualized. Aortic valve sclerosis/calcification is present, without any  evidence of aortic stenosis.   7. The inferior vena cava is dilated in size with >50% respiratory  variability, suggesting right atrial pressure of 8 mmHg.    R/LHC 10/27/21   Prox RCA to Mid RCA lesion is 25% stenosed.   Dist RCA lesion is 25% stenosed.   LV end diastolic pressure is moderately elevated.   Hemodynamic findings consistent with moderate pulmonary hypertension.   There is no aortic valve stenosis.   Aortic saturation 93%, PA saturation 58%, PA pressure 54/18, mean PA pressure 35 mmHg, mean pulmonary capillary wedge pressure 18 mmHg, cardiac output 3.87 L/min, cardiac index 2.24.   Mild to moderate pulmonary artery hypertension.  Evidence of mild volume overload.   Mild, nonobstructive coronary artery disease.   Nonischemic cardiomyopathy.  Continue medical therapy.  Consider low dose diuretic.   Laboratory Data:  High Sensitivity Troponin:   Recent Labs  Lab 03/13/22 1314 03/13/22 1646  TROPONINIHS 76* 94*     Chemistry Recent Labs  Lab 03/13/22 1314 03/14/22 0322  NA 143 142  K 3.5 3.4*  CL 105 106  CO2  26 26  GLUCOSE 143* 98  BUN 35* 35*  CREATININE 1.37* 1.31*  CALCIUM 9.0 8.5*  MG 1.9  --   GFRNONAA 39* 41*  ANIONGAP 12 10    No results for input(s): "PROT", "ALBUMIN", "AST", "ALT", "ALKPHOS", "BILITOT" in the last 168 hours. Lipids No results for input(s): "CHOL", "TRIG", "HDL", "LABVLDL", "LDLCALC", "CHOLHDL" in the last 168 hours.  Hematology Recent Labs  Lab 03/13/22 1314   WBC 6.1  RBC 3.95  HGB 12.2  HCT 39.4  MCV 99.7  MCH 30.9  MCHC 31.0  RDW 15.6*  PLT 206   Thyroid No results for input(s): "TSH", "FREET4" in the last 168 hours.  BNP Recent Labs  Lab 03/13/22 1314  BNP >4,500.0*    DDimer No results for input(s): "DDIMER" in the last 168 hours.   Radiology/Studies:  DG Chest 2 View  Result Date: 03/13/2022 CLINICAL DATA:  Swelling in legs.  Shortness of breath EXAM: CHEST - 2 VIEW COMPARISON:  10/07/2021 and older FINDINGS: Small bilateral pleural effusion with some adjacent opacity. No pneumothorax or edema. Enlarged cardiopericardial silhouette. Mild vascular congestion. Calcified tortuous aorta. Surgical changes identified with bird's along the right humeral head and left shoulder arthroplasty. IMPRESSION: Small pleural effusions with some adjacent opacity at the left lung base, similar to the prior x-ray. Mild vascular central congestion. Electronically Signed   By: Karen Kays M.D.   On: 03/13/2022 12:35     Assessment and Plan:   Acute on chronic HFrEF/nonischemic cardiomyopathy -LVEF less than 20% on last echocardiogram 02/15/2022 -Arrived with worsening peripheral edema without improvement with increase in furosemide therapy -BNP greater than 4500 -Continued with 2-3+ edema to the bilateral lower extremities --480 mL output in the last 24 hours documented -Continued on furosemide 40 mg IVP twice daily -Continued on bisoprolol 2.5 mg daily -Recommend restarting Farxiga 10 mg daily if OK with primary team -Continue to escalate GDMT as tolerated by kidney function and blood pressure -Patient previously not on ACE/ARB/ARNI due to soft blood pressures and CKD stage IIIa -Daily weights, I's and O's, low-sodium diet  Pulmonary hypertension -Severely elevated pulmonary artery systolic pressure on echocardiogram completed 02/15/2022 -Continue with diuresis -Recommend follow-up with advanced heart failure on discharge  Essential  hypertension -Blood pressure 115/83 -Continue on bisoprolol 2.5 mg daily -Continue Imdur 30 mg daily -Vital signs per unit protocol  CKD stage IIIa -Serum creatinine 1.31 -At baseline -Daily BMP especially while on diuretic therapy -Continue to monitor urine output -Monitor/trend/replete electrolytes as needed -Avoid nephrotoxic agents were able  Hypokalemia -Serum potassium 3.4 -40 mEq potassium chloride ordered for this morning -Recommend keeping potassium closer to 4 -Daily BMP -Continue to replete/trend/monitor electrolytes  OSA -recommend to continue with CPAP    Risk Assessment/Risk Scores:        New York Heart Association (NYHA) Functional Class NYHA Class III        For questions or updates, please contact [facility] Please consult [email or web address] for contact info under    Signed, Kanoelani Dobies, NP  03/14/2022 8:08 AM

## 2022-03-14 NOTE — Progress Notes (Signed)
PROGRESS NOTE    Heather [name]  QIO:[id number] DOB: 04/05/1940 DOA: 03/13/2022 PCP: [name], MD   Brief Narrative:  This 82 years old female with PMH significant for systolic CHF, hypertension, COPD, OSA, CVA, CKD stage IIIa presented in the ED with complaints of bilateral lower extremity swelling that has progressed over the last 1 month.  She used to take Lasix 20 mg daily but the dose has gradually been increased to 60 mg daily without any improvement.  Her thighs feels so swollen and now has developed difficulty breathing with exertion.  She denies any chest pain. In the ED workup reveals BNP greater than 4500.  Chest x-ray shows bilateral pleural effusions, left lung base opacity and central vascular congestion consistent with CHF exacerbation.  Patient is admitted for further evaluation.  Cardiology is consulted,  Patient is started on Lasix drip.  Assessment & Plan:   Principal Problem:   Acute on chronic systolic CHF (congestive heart failure) (HCC) Active Problems:   Stage 3a chronic kidney disease (HCC)   Essential hypertension   Gout   COPD GOLD 1    OSA on CPAP   Prolonged QT interval  Acute on chronic systolic CHF: Patient presented with bilateral lower extremity swelling, SOB on exertion, BNP 4500, chest x-ray showing mild vascular congestion and small pleural effusion. Recent echo showsed LVEF less than 20%. Patient was taking Lasix 20 mg daily which has gradually been increased to 60 mg daily without any improvement. Initiated on Lasix 40 mg IV 12 hours, Obtain daily weight, intake output charting's, daily BMP Hold Farxiga while inpatient. Cardiology is consulted, Patient is started on Lasix drip.  CKD Stage IIIA: Serum creatinine at baseline. Avoid nephrotoxic medications.  Prolonged QT interval: QTc 536, potassium 3.5 Replace potassium and magnesium. Hold venlafaxine.  COPD: Stable,  does not appear in exacerbation. Continue home bronchodilator  regimen.  Acute Gout: Continue allopurinol.  Essential hypertension: Blood pressure is stable Hold bisoprolol and Imdur given decompensated heart failure.  History of CVA: Continue aspirin and Lipitor.  DVT prophylaxis: Lovenox Code Status: Full code. Family Communication:No family at bed side. Disposition Plan:   Status is: Inpatient Remains inpatient appropriate because: Admitted for acute systolic CHF requiring IV diuresis.  Cardiology is consulted, started on Lasix drip   Consultants:  Cardiology  Procedures: Echo  Antimicrobials: None  Subjective: Patient was seen and examined at bedside.  Overnight events noted.   Patient reports doing much better. She was sitting comfortably on the chair but still has significant swelling in both legs up to the knees.  Objective: Vitals:   03/14/22 0151 03/14/22 0555 03/14/22 0750 03/14/22 0907  BP: 106/68 115/83    Pulse: 72 72    Resp: 18 18    Temp: (!) 97.4 F (36.3 C) 97.6 F (36.4 C)    TempSrc: Oral Oral    SpO2: 98% 100% 92% 93%  Weight:      Height:        Intake/Output Summary (Last 24 hours) at 03/14/2022 1152 Last data filed at 03/14/2022 0948 Gross per 24 hour  Intake --  Output 680 ml  Net -680 ml   Filed Weights   03/13/22 2112  Weight: 77.2 kg    Examination:  General exam: Appears comfortable, not in any acute distress.  Deconditioned Respiratory system: Bibasilar crackles noted, respiratory effort normal, no accessory muscle use. Cardiovascular system: S1 & S2 heard, regular rate and rhythm, no murmur. Gastrointestinal system: Abdomen is soft,  non tender, non distended, BS+ Central nervous system: Alert and oriented x 3. No focal neurological deficits. Extremities: Bipedal edema, no cyanosis, no clubbing Skin: No rashes, lesions or ulcers Psychiatry: Judgement and insight appear normal. Mood & affect appropriate.     Data Reviewed: I have personally reviewed following labs and imaging  studies  CBC: Recent Labs  Lab 03/13/22 1314  WBC 6.1  HGB 12.2  HCT 39.4  MCV 99.7  PLT 833   Basic Metabolic Panel: Recent Labs  Lab 03/13/22 1314 03/14/22 0322  NA 143 142  K 3.5 3.4*  CL 105 106  CO2 26 26  GLUCOSE 143* 98  BUN 35* 35*  CREATININE 1.37* 1.31*  CALCIUM 9.0 8.5*  MG 1.9  --    GFR: Estimated Creatinine Clearance: 33.7 mL/min (A) (by C-G formula based on SCr of 1.31 mg/dL (H)). Liver Function Tests: No results for input(s): "AST", "ALT", "ALKPHOS", "BILITOT", "PROT", "ALBUMIN" in the last 168 hours. No results for input(s): "LIPASE", "AMYLASE" in the last 168 hours. No results for input(s): "AMMONIA" in the last 168 hours. Coagulation Profile: No results for input(s): "INR", "PROTIME" in the last 168 hours. Cardiac Enzymes: No results for input(s): "CKTOTAL", "CKMB", "CKMBINDEX", "TROPONINI" in the last 168 hours. BNP (last 3 results) No results for input(s): "PROBNP" in the last 8760 hours. HbA1C: No results for input(s): "HGBA1C" in the last 72 hours. CBG: No results for input(s): "GLUCAP" in the last 168 hours. Lipid Profile: No results for input(s): "CHOL", "HDL", "LDLCALC", "TRIG", "CHOLHDL", "LDLDIRECT" in the last 72 hours. Thyroid Function Tests: No results for input(s): "TSH", "T4TOTAL", "FREET4", "T3FREE", "THYROIDAB" in the last 72 hours. Anemia Panel: No results for input(s): "VITAMINB12", "FOLATE", "FERRITIN", "TIBC", "IRON", "RETICCTPCT" in the last 72 hours. Sepsis Labs: No results for input(s): "PROCALCITON", "LATICACIDVEN" in the last 168 hours.  No results found for this or any previous visit (from the past 240 hour(s)).   Radiology Studies: DG Chest 2 View  Result Date: 03/13/2022 CLINICAL DATA:  Swelling in legs.  Shortness of breath EXAM: CHEST - 2 VIEW COMPARISON:  10/07/2021 and older FINDINGS: Small bilateral pleural effusion with some adjacent opacity. No pneumothorax or edema. Enlarged cardiopericardial silhouette.  Mild vascular congestion. Calcified tortuous aorta. Surgical changes identified with bird's along the right humeral head and left shoulder arthroplasty. IMPRESSION: Small pleural effusions with some adjacent opacity at the left lung base, similar to the prior x-ray. Mild vascular central congestion. Electronically Signed   By: Jill Side M.D.   On: 03/13/2022 12:35     Scheduled Meds:  allopurinol  150 mg Oral Daily   aspirin  81 mg Oral Daily   atorvastatin  10 mg Oral Daily   famotidine  20 mg Oral QHS   fluticasone furoate-vilanterol  1 puff Inhalation Daily   And   umeclidinium bromide  1 puff Inhalation Daily   heparin  5,000 Units Subcutaneous Q8H   Living Better with Heart Failure Book   Does not apply Once   Continuous Infusions:  furosemide (LASIX) 200 mg in dextrose 5 % 100 mL (2 mg/mL) infusion       LOS: 1 day    Time spent: 50 mins    Shawna Clamp, MD [facility]   If 7PM-7AM, please contact night-coverage

## 2022-03-14 NOTE — TOC Initial Note (Signed)
Transition of Care [facility]) - Initial/Assessment Note    Patient Details  Name: Heather [name] MRN: [id number] Date of Birth: [birth year]  Transition of Care [facility]) CM/SW Contact:    Salome Arnt, [place] Phone Number: 03/14/2022, 9:49 AM  Clinical Narrative:  Pt admitted due to acute on chronic systolic CHF. Assessment completed with pt. Pt reports she lives alone and is independent with ADLs. She drives herself to appointments or insurance arranges transport. Pt plans to return home when medically stable.   TOC received CHF screening consult. Pt reports she was diagnosed with CHF in August 2023. She weighs herself daily and "pretty much" follows heart healthy diet. Pt takes medications as prescribed. Living Well with CHF book ordered. Discussed HHRN for CHF. Pt is agreeable and requests [facility]. Referred and accepted by Caryl Pina with [facility]. Will need order.                  Expected Discharge Plan: [place] Barriers to Discharge: Continued Medical Work up   Patient Goals and CMS Choice Patient states their goals for this hospitalization and ongoing recovery are:: return home   Choice offered to / list presented to : Patient [place] ownership interest in [facility]provided to::  (n/a)    Expected Discharge Plan and Services In-house Referral: Clinical Social Work   Post Acute Care Choice: [place] arrangements for the past 2 months: Apartment                           HH Arranged: RN [place] Agency: [place] ([place]) Date HH Agency Contacted: 03/14/22 Time [place]: [phone] Representative spoke with at [place]: [place] Arrangements/Services Living arrangements for the past 2 months: [place] with:: Self Patient language and need for interpreter reviewed:: Yes Do you feel safe going back to the place where you live?: Yes      Need for Family Participation in Patient Care: No (Comment)    Current home services: DME (walker, cane) Criminal Activity/Legal Involvement Pertinent to Current Situation/Hospitalization: No - Comment as needed  Activities of Daily Living Home Assistive Devices/Equipment: Cane (specify quad or straight), Walker (specify type) ADL Screening (condition at time of admission) Patient's cognitive ability adequate to safely complete daily activities?: Yes Is the patient deaf or have difficulty hearing?: No Does the patient have difficulty seeing, even when wearing glasses/contacts?: No Does the patient have difficulty concentrating, remembering, or making decisions?: No Patient able to express need for assistance with ADLs?: Yes Does the patient have difficulty dressing or bathing?: No Independently performs ADLs?: Yes (appropriate for developmental age) Does the patient have difficulty walking or climbing stairs?: No Weakness of Legs: Both Weakness of Arms/Hands: None  Permission Sought/Granted                  Emotional Assessment     Affect (typically observed): Appropriate Orientation: : Oriented to Self, Oriented to Place, Oriented to  Time, Oriented to Situation Alcohol / Substance Use: Not Applicable Psych Involvement: No (comment)  Admission diagnosis:  Acute on chronic systolic CHF (congestive heart failure) (HCC) [I50.23] Acute on chronic congestive heart failure, unspecified heart failure type ([place]) [I50.9] Patient Active Problem List   Diagnosis Date Noted   Acute on chronic systolic CHF (congestive heart failure) ([place]) 03/13/2022   Prolonged QT interval 03/13/2022   Bloating 02/02/2022   Cerebral infarction, remote, resolved 11/04/2021  OSA on CPAP 11/04/2021   Acute systolic CHF (congestive heart failure) (HCC) 10/09/2021   SOBOE (shortness of breath on exertion) 10/07/2021   Hyperkalemia 10/07/2021   Leg swelling 01/31/2021   Acute kidney injury (HCC) 12/14/2020   Tremor 06/21/2020   DOE (dyspnea on exertion)  05/25/2020   Stage 3a chronic kidney disease (HCC) 04/09/2020   Leg edema 02/03/2020   AKI (acute kidney injury) (HCC) 01/16/2020   Prediabetes 01/16/2020   Preventative health care 01/13/2020   Hyperlipidemia 01/13/2020   Anxiety 01/13/2020   COPD GOLD 1  01/13/2020   Gastroesophageal reflux disease 01/13/2020   Intertrigo 01/13/2020   Long-term current use of opiate analgesic 07/07/2019   Sacroiliac joint pain 07/07/2019   Bilateral carpal tunnel syndrome 05/10/2019   Chronic low back pain 05/10/2019   Lumbosacral radiculopathy 05/10/2019   Polyarthropathy 05/10/2019   Gout 10/24/2018   Essential hypertension 04/17/2014   Renal insufficiency 04/01/2014   Rotator cuff arthropathy    PCP:  [name], MD Pharmacy:   [facility] - [place], [place] - [address] [place] [address] Phone: [phone] Fax: [phone]  [facility] - [place], [place] - [address] [place] [address] Phone: [phone] Fax: [phone]     Social Determinants of Health (SDOH) Social History: SDOH Screenings   Food Insecurity: No Food Insecurity (03/13/2022)  Housing: Low Risk  (03/13/2022)  Transportation Needs: Unmet Transportation Needs (03/13/2022)  Utilities: Not At Risk (03/13/2022)  Alcohol Screen: Low Risk  (11/08/2021)  Depression (PHQ2-9): Low Risk  (02/02/2022)  Financial Resource Strain: High Risk (11/08/2021)  Physical Activity: Sufficiently Active (11/08/2021)  Social Connections: Moderately Isolated (11/08/2021)  Stress: No Stress Concern Present (11/08/2021)  Tobacco Use: Medium Risk (03/13/2022)   SDOH Interventions:     Readmission Risk Interventions     No data to display

## 2022-03-14 NOTE — Progress Notes (Signed)
Pt refused CPAP tonight, Machine at bedside

## 2022-03-15 DIAGNOSIS — Z8673 Personal history of transient ischemic attack (TIA), and cerebral infarction without residual deficits: Secondary | ICD-10-CM | POA: Diagnosis not present

## 2022-03-15 DIAGNOSIS — I5023 Acute on chronic systolic (congestive) heart failure: Secondary | ICD-10-CM | POA: Diagnosis not present

## 2022-03-15 LAB — COMPREHENSIVE METABOLIC PANEL
ALT: 41 U/L (ref 0–44)
AST: 51 U/L — ABNORMAL HIGH (ref 15–41)
Albumin: 3.3 g/dL — ABNORMAL LOW (ref 3.5–5.0)
Alkaline Phosphatase: 78 U/L (ref 38–126)
Anion gap: 13 (ref 5–15)
BUN: 36 mg/dL — ABNORMAL HIGH (ref 8–23)
CO2: 26 mmol/L (ref 22–32)
Calcium: 8.8 mg/dL — ABNORMAL LOW (ref 8.9–10.3)
Chloride: 102 mmol/L (ref 98–111)
Creatinine, Ser: 1.39 mg/dL — ABNORMAL HIGH (ref 0.44–1.00)
GFR, Estimated: 38 mL/min — ABNORMAL LOW (ref >60)
Glucose, Bld: 117 mg/dL — ABNORMAL HIGH (ref 70–99)
Potassium: 3.5 mmol/L (ref 3.5–5.1)
Sodium: 141 mmol/L (ref 135–145)
Total Bilirubin: 1.1 mg/dL (ref 0.3–1.2)
Total Protein: 6.2 g/dL — ABNORMAL LOW (ref 6.5–8.1)

## 2022-03-15 LAB — CBC
HCT: 38.3 % (ref 36.0–46.0)
Hemoglobin: 12 g/dL (ref 12.0–15.0)
MCH: 30.6 pg (ref 26.0–34.0)
MCHC: 31.3 g/dL (ref 30.0–36.0)
MCV: 97.7 fL (ref 80.0–100.0)
Platelets: 205 10*3/uL (ref 150–400)
RBC: 3.92 MIL/uL (ref 3.87–5.11)
RDW: 15.9 % — ABNORMAL HIGH (ref 11.5–15.5)
WBC: 4.4 10*3/uL (ref 4.0–10.5)
nRBC: 0 % (ref 0.0–0.2)

## 2022-03-15 LAB — BRAIN NATRIURETIC PEPTIDE: B Natriuretic Peptide: >4500 pg/mL — ABNORMAL HIGH (ref 0.0–100.0)

## 2022-03-15 MED ORDER — FUROSEMIDE 10 MG/ML IJ SOLN
INTRAMUSCULAR | Status: AC
  Filled 2022-03-15: qty 20

## 2022-03-15 NOTE — Progress Notes (Signed)
PROGRESS NOTE    Heather [name]  OAC:[id number] DOB: 1940-08-11 DOA: 03/13/2022 PCP: [name], MD   Brief Narrative:  This 82 years old female with PMH significant for systolic CHF, hypertension, COPD, OSA, CVA, CKD stage IIIa presented in the ED with complaints of bilateral lower extremity swelling that has progressed over the last 1 month.  She used to take Lasix 20 mg daily but the dose has gradually been increased to 60 mg daily without any improvement.  Her thighs feels so swollen and now has developed difficulty breathing with exertion.  She denies any chest pain. In the ED workup reveals BNP greater than 4500.  Chest x-ray shows bilateral pleural effusions, left lung base opacity and central vascular congestion consistent with CHF exacerbation.  Patient is admitted for further evaluation.  Cardiology is consulted,  Patient is started on Lasix drip.  Assessment & Plan:   Principal Problem:   Acute on chronic systolic CHF (congestive heart failure) (HCC) Active Problems:   Stage 3a chronic kidney disease (HCC)   Essential hypertension   Gout   COPD GOLD 1    OSA on CPAP   Prolonged QT interval  Acute on chronic systolic CHF: Patient presented with bilateral lower extremity swelling, SOB on exertion, BNP 4500, chest x-ray showing mild vascular congestion and small pleural effusion. Recent echo showsed LVEF less than 20%. Patient was taking Lasix 20 mg daily which has gradually been increased to 60 mg daily without any improvement. Initiated on Lasix 40 mg IV 12 hours, Obtain daily weight, intake output charting's, daily BMP Hold Farxiga while inpatient. Cardiology is consulted, Patient is started on Lasix drip. She still appears fluid overloaded.  Urine output in last 24 hours 2.5 L net negative balance 1.8 L Lasix dose increased from 10 to 15 mcg/hr Hold beta-blockers until she is more volume compensated.   Intake/Output Summary (Last 24 hours) at 03/15/2022 1105 Last  data filed at 03/15/2022 0544 Gross per 24 hour  Intake 564.98 ml  Output 2250 ml  Net -1685.02 ml     CKD Stage IIIA: Serum creatinine at baseline. Avoid nephrotoxic medications.  Prolonged QT interval: QTc 536, potassium 3.5 Replace potassium and magnesium. Hold venlafaxine.  COPD: Stable,  does not appear in exacerbation. Continue home bronchodilator regimen.  Acute Gout: Continue allopurinol.  Essential hypertension: Blood pressure is stable Hold bisoprolol and Imdur given decompensated heart failure.  History of CVA: Continue aspirin and Lipitor.  DVT prophylaxis: Lovenox Code Status: Full code. Family Communication: No family at bed side. Disposition Plan:   Status is: Inpatient Remains inpatient appropriate because: Admitted for acute systolic CHF requiring IV diuresis.  Cardiology is consulted, started on Lasix drip   Consultants:  Cardiology  Procedures: Echo  Antimicrobials: None  Subjective: Patient was seen and examined at bedside.  Overnight events noted.   Patient reports doing better still not able to lie flat on the bed and has similar swelling in both lower extremities. Urine output in last 24 hours 2.5 L, net negative balance 1.8 L.  Objective: Vitals:   03/14/22 1546 03/14/22 2129 03/15/22 0536 03/15/22 0721  BP: 113/63 114/73 114/78   Pulse: 77 81 92   Resp: 18 17 18    Temp: 98.5 F (36.9 C) 98.8 F (37.1 C) 98 F (36.7 C)   TempSrc: Oral Oral Oral   SpO2: 97% 96% 97% 97%  Weight:   75.3 kg   Height:        Intake/Output Summary (  Last 24 hours) at 03/15/2022 1105 Last data filed at 03/15/2022 0544 Gross per 24 hour  Intake 564.98 ml  Output 2250 ml  Net -1685.02 ml   Filed Weights   03/13/22 2112 03/15/22 0536  Weight: 77.2 kg 75.3 kg    Examination:  General exam: Appears comfortable, not in any acute distress.  Deconditioned. Respiratory system: Bibasilar crackles noted, respiratory effort normal, no accessory muscle  use. Cardiovascular system: S1 & S2 heard, regular rate and rhythm, no murmur. Gastrointestinal system: Abdomen is soft, non tender, non distended, BS+ Central nervous system: Alert and oriented x 3. No focal neurological deficits. Extremities: Bipedal edema, no cyanosis, no clubbing Skin: No rashes, lesions or ulcers Psychiatry: Judgement and insight appear normal. Mood & affect appropriate.   Data Reviewed: I have personally reviewed following labs and imaging studies  CBC: Recent Labs  Lab 03/13/22 1314 03/15/22 0317  WBC 6.1 4.4  HGB 12.2 12.0  HCT 39.4 38.3  MCV 99.7 97.7  PLT 206 093   Basic Metabolic Panel: Recent Labs  Lab 03/13/22 1314 03/14/22 0322 03/14/22 1530 03/15/22 0317  NA 143 142 141 141  K 3.5 3.4* 4.1 3.5  CL 105 106 103 102  CO2 26 26 28 26   GLUCOSE 143* 98 101* 117*  BUN 35* 35* 35* 36*  CREATININE 1.37* 1.31* 1.60* 1.39*  CALCIUM 9.0 8.5* 8.8* 8.8*  MG 1.9  --   --   --    GFR: Estimated Creatinine Clearance: 31.4 mL/min (A) (by C-G formula based on SCr of 1.39 mg/dL (H)). Liver Function Tests: Recent Labs  Lab 03/15/22 0317  AST 51*  ALT 41  ALKPHOS 78  BILITOT 1.1  PROT 6.2*  ALBUMIN 3.3*   No results for input(s): "LIPASE", "AMYLASE" in the last 168 hours. No results for input(s): "AMMONIA" in the last 168 hours. Coagulation Profile: No results for input(s): "INR", "PROTIME" in the last 168 hours. Cardiac Enzymes: No results for input(s): "CKTOTAL", "CKMB", "CKMBINDEX", "TROPONINI" in the last 168 hours. BNP (last 3 results) No results for input(s): "PROBNP" in the last 8760 hours. HbA1C: No results for input(s): "HGBA1C" in the last 72 hours. CBG: No results for input(s): "GLUCAP" in the last 168 hours. Lipid Profile: No results for input(s): "CHOL", "HDL", "LDLCALC", "TRIG", "CHOLHDL", "LDLDIRECT" in the last 72 hours. Thyroid Function Tests: No results for input(s): "TSH", "T4TOTAL", "FREET4", "T3FREE", "THYROIDAB" in the  last 72 hours. Anemia Panel: No results for input(s): "VITAMINB12", "FOLATE", "FERRITIN", "TIBC", "IRON", "RETICCTPCT" in the last 72 hours. Sepsis Labs: No results for input(s): "PROCALCITON", "LATICACIDVEN" in the last 168 hours.  No results found for this or any previous visit (from the past 240 hour(s)).   Radiology Studies: DG Chest 2 View  Result Date: 03/13/2022 CLINICAL DATA:  Swelling in legs.  Shortness of breath EXAM: CHEST - 2 VIEW COMPARISON:  10/07/2021 and older FINDINGS: Small bilateral pleural effusion with some adjacent opacity. No pneumothorax or edema. Enlarged cardiopericardial silhouette. Mild vascular congestion. Calcified tortuous aorta. Surgical changes identified with bird's along the right humeral head and left shoulder arthroplasty. IMPRESSION: Small pleural effusions with some adjacent opacity at the left lung base, similar to the prior x-ray. Mild vascular central congestion. Electronically Signed   By: Jill Side M.D.   On: 03/13/2022 12:35     Scheduled Meds:  allopurinol  150 mg Oral Daily   aspirin  81 mg Oral Daily   atorvastatin  10 mg Oral Daily   famotidine  20 mg Oral QHS   fluticasone furoate-vilanterol  1 puff Inhalation Daily   And   umeclidinium bromide  1 puff Inhalation Daily   heparin  5,000 Units Subcutaneous Q8H   Continuous Infusions:  furosemide (LASIX) 200 mg in dextrose 5 % 100 mL (2 mg/mL) infusion 15 mg/hr (03/15/22 1011)     LOS: 2 days    Time spent: 35 mins    Keatin Benham, MD [facility]   If 7PM-7AM, please contact night-coverage

## 2022-03-15 NOTE — Progress Notes (Signed)
Patient declined CPAP. Unit at bedside if she changes her mind.

## 2022-03-15 NOTE — Plan of Care (Signed)

## 2022-03-15 NOTE — Progress Notes (Signed)
Rounding Note    Patient Name: Heather [name] Date of Encounter: 03/15/2022  [place] Cardiologist: Evalina Field, MD   Subjective   Patient seen on Am rounds. Denies any worsening shortness of breath but endorses abdominal discomfort this morning. Remains on Lasix infusion at 10mg /hr. -1885 output in the last 24 hours.  Inpatient Medications    Scheduled Meds:  allopurinol  150 mg Oral Daily   aspirin  81 mg Oral Daily   atorvastatin  10 mg Oral Daily   famotidine  20 mg Oral QHS   fluticasone furoate-vilanterol  1 puff Inhalation Daily   And   umeclidinium bromide  1 puff Inhalation Daily   heparin  5,000 Units Subcutaneous Q8H   Continuous Infusions:  furosemide (LASIX) 200 mg in dextrose 5 % 100 mL (2 mg/mL) infusion 10 mg/hr (03/15/22 0628)   PRN Meds: acetaminophen **OR** acetaminophen, albuterol, alum & mag hydroxide-simeth, HYDROcodone-acetaminophen, phenol, polyethylene glycol, zolpidem   Vital Signs    Vitals:   03/14/22 1546 03/14/22 2129 03/15/22 0536 03/15/22 0721  BP: 113/63 114/73 114/78   Pulse: 77 81 92   Resp: 18 17 18    Temp: 98.5 F (36.9 C) 98.8 F (37.1 C) 98 F (36.7 C)   TempSrc: Oral Oral Oral   SpO2: 97% 96% 97% 97%  Weight:   75.3 kg   Height:        Intake/Output Summary (Last 24 hours) at 03/15/2022 0851 Last data filed at 03/15/2022 0544 Gross per 24 hour  Intake 564.98 ml  Output 2450 ml  Net -1885.02 ml      03/15/2022    5:36 AM 03/13/2022    9:12 PM 02/02/2022    9:52 AM  Last 3 Weights  Weight (lbs) 166 lb 170 lb 3.1 oz 157 lb 3.2 oz  Weight (kg) 75.297 kg 77.2 kg 71.305 kg      Telemetry    Sinus with unifocal PVC's and chronic LBBB, rates of 80's - Personally Reviewed  ECG    No new tracings - Personally Reviewed  Physical Exam   GEN: No acute distress.   Neck: No JVD Cardiac: RRR, no murmurs, rubs, or gallops.  Respiratory: Diminished bases to auscultation bilaterally.Respirations remain  unlabored at rest on room air GI: Soft, nontender, non-distended  MS: 2-3+ pitting edema above the knees bilaterally ; No deformity. Neuro:  Nonfocal  Psych: Normal affect   Labs    High Sensitivity Troponin:   Recent Labs  Lab 03/13/22 1314 03/13/22 1646  TROPONINIHS 76* 94*     Chemistry Recent Labs  Lab 03/13/22 1314 03/14/22 0322 03/14/22 1530 03/15/22 0317  NA 143 142 141 141  K 3.5 3.4* 4.1 3.5  CL 105 106 103 102  CO2 26 26 28 26   GLUCOSE 143* 98 101* 117*  BUN 35* 35* 35* 36*  CREATININE 1.37* 1.31* 1.60* 1.39*  CALCIUM 9.0 8.5* 8.8* 8.8*  MG 1.9  --   --   --   PROT  --   --   --  6.2*  ALBUMIN  --   --   --  3.3*  AST  --   --   --  51*  ALT  --   --   --  41  ALKPHOS  --   --   --  78  BILITOT  --   --   --  1.1  GFRNONAA 39* 41* 32* 38*  ANIONGAP 12 10 10  13  Lipids No results for input(s): "CHOL", "TRIG", "HDL", "LABVLDL", "LDLCALC", "CHOLHDL" in the last 168 hours.  Hematology Recent Labs  Lab 03/13/22 1314 03/15/22 0317  WBC 6.1 4.4  RBC 3.95 3.92  HGB 12.2 12.0  HCT 39.4 38.3  MCV 99.7 97.7  MCH 30.9 30.6  MCHC 31.0 31.3  RDW 15.6* 15.9*  PLT 206 205   Thyroid No results for input(s): "TSH", "FREET4" in the last 168 hours.  BNP Recent Labs  Lab 03/13/22 1314  BNP >4,500.0*    DDimer No results for input(s): "DDIMER" in the last 168 hours.   Radiology    DG Chest 2 View  Result Date: 03/13/2022 CLINICAL DATA:  Swelling in legs.  Shortness of breath EXAM: CHEST - 2 VIEW COMPARISON:  10/07/2021 and older FINDINGS: Small bilateral pleural effusion with some adjacent opacity. No pneumothorax or edema. Enlarged cardiopericardial silhouette. Mild vascular congestion. Calcified tortuous aorta. Surgical changes identified with bird's along the right humeral head and left shoulder arthroplasty. IMPRESSION: Small pleural effusions with some adjacent opacity at the left lung base, similar to the prior x-ray. Mild vascular central congestion.  Electronically Signed   By: Karen Kays M.D.   On: 03/13/2022 12:35    Cardiac Studies   TTE 02/15/22 1. Left ventricular ejection fraction, by estimation, is <20%. The left  ventricle has severely decreased function. The left ventricle has no  regional wall motion abnormalities. The left ventricular internal cavity  size was mildly dilated. Left  ventricular diastolic parameters are consistent with Grade II diastolic  dysfunction (pseudonormalization). There is the interventricular septum is  flattened in systole and diastole, consistent with right ventricular  pressure and volume overload.No  evidence of LV thrombus.   2. Right ventricular systolic function is moderately reduced. The right  ventricular size is mildly enlarged. There is severely elevated pulmonary  artery systolic pressure.   3. Left atrial size was moderately dilated.   4. Right atrial size was moderately dilated.   5. The mitral valve is abnormal. Mild to moderate mitral valve  regurgitation. No evidence of mitral stenosis.   6. The aortic valve is abnormal. Aortic valve regurgitation is not  visualized. Aortic valve sclerosis/calcification is present, without any  evidence of aortic stenosis.   7. The inferior vena cava is dilated in size with >50% respiratory  variability, suggesting right atrial pressure of 8 mmHg.      R/LHC 10/27/21   Prox RCA to Mid RCA lesion is 25% stenosed.   Dist RCA lesion is 25% stenosed.   LV end diastolic pressure is moderately elevated.   Hemodynamic findings consistent with moderate pulmonary hypertension.   There is no aortic valve stenosis.   Aortic saturation 93%, PA saturation 58%, PA pressure 54/18, mean PA pressure 35 mmHg, mean pulmonary capillary wedge pressure 18 mmHg, cardiac output 3.87 L/min, cardiac index 2.24.   Mild to moderate pulmonary artery hypertension.  Evidence of mild volume overload.   Mild, nonobstructive coronary artery disease.   Nonischemic  cardiomyopathy.  Continue medical therapy.  Consider low dose diuretic.   Patient Profile     82 y.o. 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, COPD, prior CVA, HFrEF with severely reduced LVEF 15-20%, OSA with CPAP, and CKD stage III who has been seen and evaluated for acute on chronic HFrEF   Assessment & Plan    Acute on chronic HFrEF/ischemic cardiomyopathy -LVEF less than 20% on last echocardiogram -BNP greater  than 4500 - -1885 urine output in the last 24 hours -Continued Furosemide 10 mg/hour drip -Daily BMP while on diuretic therapy -daily weights, I&O's, and low sodium diet  Pulmonary hypertension -severely elevated pulmonary artery systolic pressure on echocardiogram -continued with diuresis  Essential hypertension -blood pressure 114/78 -continued on furosemide drip with bisoprolol and imdur currently on hold to all for dieresis  CKD stage III -serum creatinine 1.39 on furosemide drip -likely cardiorenal -continue to diuresis until creatinine increases then transition to oral dieretics -monitor urine output -daily bmp -avoid nephrotoxic agents where able   Hypokalemia -serum potassium today 3.5 -supplemented yesterday -daily bmp -monitor/trend/replete electrolytes as needed  OSA -continue with nightly CPAP -of note patient refused CPAP last evening  7.  History of CVA -continue asa 81 mg daily -continue atorvastatin 10 mg daily -LDL goal of less than 70     For questions or updates, please contact [place] Please consult [email or web address] for contact info under        Signed, Edmar Blankenburg, NP  03/15/2022, 8:51 AM

## 2022-03-16 ENCOUNTER — Inpatient Hospital Stay: Payer: Self-pay

## 2022-03-16 DIAGNOSIS — R570 Cardiogenic shock: Secondary | ICD-10-CM | POA: Diagnosis not present

## 2022-03-16 DIAGNOSIS — I5023 Acute on chronic systolic (congestive) heart failure: Secondary | ICD-10-CM | POA: Diagnosis not present

## 2022-03-16 DIAGNOSIS — Z8673 Personal history of transient ischemic attack (TIA), and cerebral infarction without residual deficits: Secondary | ICD-10-CM | POA: Diagnosis not present

## 2022-03-16 LAB — COMPREHENSIVE METABOLIC PANEL
ALT: 36 U/L (ref 0–44)
ALT: 32 U/L (ref 0–44)
AST: 44 U/L — ABNORMAL HIGH (ref 15–41)
AST: 42 U/L — ABNORMAL HIGH (ref 15–41)
Albumin: 3.3 g/dL — ABNORMAL LOW (ref 3.5–5.0)
Albumin: 3.2 g/dL — ABNORMAL LOW (ref 3.5–5.0)
Alkaline Phosphatase: 75 U/L (ref 38–126)
Alkaline Phosphatase: 74 U/L (ref 38–126)
Anion gap: 11 (ref 5–15)
Anion gap: 10 (ref 5–15)
BUN: 33 mg/dL — ABNORMAL HIGH (ref 8–23)
BUN: 27 mg/dL — ABNORMAL HIGH (ref 8–23)
CO2: 30 mmol/L (ref 22–32)
CO2: 32 mmol/L (ref 22–32)
Calcium: 8.8 mg/dL — ABNORMAL LOW (ref 8.9–10.3)
Calcium: 8.6 mg/dL — ABNORMAL LOW (ref 8.9–10.3)
Chloride: 102 mmol/L (ref 98–111)
Chloride: 98 mmol/L (ref 98–111)
Creatinine, Ser: 1.4 mg/dL — ABNORMAL HIGH (ref 0.44–1.00)
Creatinine, Ser: 1.53 mg/dL — ABNORMAL HIGH (ref 0.44–1.00)
GFR, Estimated: 38 mL/min — ABNORMAL LOW (ref >60)
GFR, Estimated: 34 mL/min — ABNORMAL LOW (ref >60)
Glucose, Bld: 103 mg/dL — ABNORMAL HIGH (ref 70–99)
Glucose, Bld: 145 mg/dL — ABNORMAL HIGH (ref 70–99)
Potassium: 3 mmol/L — ABNORMAL LOW (ref 3.5–5.1)
Potassium: 3.4 mmol/L — ABNORMAL LOW (ref 3.5–5.1)
Sodium: 143 mmol/L (ref 135–145)
Sodium: 140 mmol/L (ref 135–145)
Total Bilirubin: 1.1 mg/dL (ref 0.3–1.2)
Total Bilirubin: 1.3 mg/dL — ABNORMAL HIGH (ref 0.3–1.2)
Total Protein: 6.3 g/dL — ABNORMAL LOW (ref 6.5–8.1)
Total Protein: 5.9 g/dL — ABNORMAL LOW (ref 6.5–8.1)

## 2022-03-16 LAB — CBC
HCT: 39.6 % (ref 36.0–46.0)
Hemoglobin: 12.5 g/dL (ref 12.0–15.0)
MCH: 30.3 pg (ref 26.0–34.0)
MCHC: 31.6 g/dL (ref 30.0–36.0)
MCV: 96.1 fL (ref 80.0–100.0)
Platelets: 215 10*3/uL (ref 150–400)
RBC: 4.12 MIL/uL (ref 3.87–5.11)
RDW: 15.6 % — ABNORMAL HIGH (ref 11.5–15.5)
WBC: 5.6 10*3/uL (ref 4.0–10.5)
nRBC: 0 % (ref 0.0–0.2)

## 2022-03-16 LAB — MRSA NEXT GEN BY PCR, NASAL: MRSA by PCR Next Gen: NOT DETECTED

## 2022-03-16 LAB — COOXEMETRY PANEL
Carboxyhemoglobin: 1.8 % — ABNORMAL HIGH (ref 0.5–1.5)
Methemoglobin: <0.7 % (ref 0.0–1.5)
O2 Saturation: 88.9 %
Total hemoglobin: 12.8 g/dL (ref 12.0–16.0)

## 2022-03-16 MED ORDER — POTASSIUM CHLORIDE 20 MEQ PO PACK
40.0000 meq | PACK | Freq: Once | ORAL | Status: DC
  Filled 2022-03-16: qty 2

## 2022-03-16 MED ORDER — POTASSIUM CHLORIDE 20 MEQ PO PACK
40.0000 meq | PACK | Freq: Once | ORAL | Status: AC
  Administered 2022-03-16: 40 meq via ORAL
  Filled 2022-03-16: qty 2

## 2022-03-16 MED ORDER — MILRINONE LACTATE IN DEXTROSE 20-5 MG/100ML-% IV SOLN
0.1250 ug/kg/min | INTRAVENOUS | Status: DC
  Administered 2022-03-16 – 2022-03-17 (×3): 0.25 ug/kg/min via INTRAVENOUS
  Administered 2022-03-18: 0.125 ug/kg/min via INTRAVENOUS
  Filled 2022-03-16 (×5): qty 100

## 2022-03-16 MED ORDER — CHLORHEXIDINE GLUCONATE CLOTH 2 % EX PADS
6.0000 | MEDICATED_PAD | Freq: Every day | CUTANEOUS | Status: DC
  Administered 2022-03-16 – 2022-03-20 (×5): 6 via TOPICAL

## 2022-03-16 NOTE — Progress Notes (Signed)
Patient refused CPAP, also states that she does not wear her CPAP at home.  RN notified RT of patient refusal of CPAP

## 2022-03-16 NOTE — Progress Notes (Signed)
Pt has been confused most of the night. Removes telemetry leads and doesn't know she has done it. Has frequency urination says is due to lasix drip but at times doesn't even pee when she says she has to pee. Bed alarm in use.

## 2022-03-16 NOTE — Progress Notes (Signed)
Pt has an order for PICC placement. PICC RN explained procedure, benefits and risks factors to pt. Pt prefers to have her son be at bedside and explain to him. Son will come in the morning. PICC RN will follow up in am.

## 2022-03-16 NOTE — Progress Notes (Signed)
Called and spoke with Signa Kell and explained that her sister has been transferred to ICU rm 2

## 2022-03-16 NOTE — Progress Notes (Signed)
Nurse updated patient's son about transfer to [facility].

## 2022-03-16 NOTE — Progress Notes (Signed)
Rounding Note    Patient Name: Heather [name] Date of Encounter: 03/16/2022  [place] Cardiologist: Evalina Field, MD   Subjective   Patient seen on a.m. rounds.  She denies any current chest pain.  She continues to have occasional shortness of breath and swelling to her bilateral lower extremities.  -1.9 L output in the last 24 hours.  She is maintained on furosemide drip at 15 mg/h.  Inpatient Medications    Scheduled Meds:  allopurinol  150 mg Oral Daily   aspirin  81 mg Oral Daily   atorvastatin  10 mg Oral Daily   famotidine  20 mg Oral QHS   fluticasone furoate-vilanterol  1 puff Inhalation Daily   And   umeclidinium bromide  1 puff Inhalation Daily   heparin  5,000 Units Subcutaneous Q8H   potassium chloride  40 mEq Oral Once   potassium chloride  40 mEq Oral Once   Continuous Infusions:  furosemide (LASIX) 200 mg in dextrose 5 % 100 mL (2 mg/mL) infusion 15 mg/hr (03/16/22 0336)   PRN Meds: acetaminophen **OR** acetaminophen, albuterol, alum & mag hydroxide-simeth, HYDROcodone-acetaminophen, phenol, polyethylene glycol, zolpidem   Vital Signs    Vitals:   03/15/22 2034 03/16/22 0426 03/16/22 0621 03/16/22 0832  BP: 124/70 123/84    Pulse: 90 81  96  Resp: 19 19  18   Temp: 98 F (36.7 C) 98 F (36.7 C)    TempSrc:      SpO2: 96% 97%  93%  Weight:   73.1 kg   Height:        Intake/Output Summary (Last 24 hours) at 03/16/2022 0909 Last data filed at 03/16/2022 0336 Gross per 24 hour  Intake 387.7 ml  Output 2300 ml  Net -1912.3 ml      03/16/2022    6:21 AM 03/15/2022    5:36 AM 03/13/2022    9:12 PM  Last 3 Weights  Weight (lbs) 161 lb 1.6 oz 166 lb 170 lb 3.1 oz  Weight (kg) 73.074 kg 75.297 kg 77.2 kg      Telemetry    Sinus with 2 focal PVCs with chronic left bundle with rates in the 80s- Personally Reviewed  ECG    No new tracings- Personally Reviewed  Physical Exam   GEN: No acute distress.   Neck: No JVD Cardiac:  RRR, no murmurs, rubs, or gallops.  Respiratory: Clear upper lobes with diminished bases to auscultation bilaterally.  Respirations are unlabored on room air GI: Soft, nontender, non-distended  MS: 2-3+BLE edema; No deformity. Neuro:  Nonfocal  Psych: Normal affect   Labs    High Sensitivity Troponin:   Recent Labs  Lab 03/13/22 1314 03/13/22 1646  TROPONINIHS 76* 94*     Chemistry Recent Labs  Lab 03/13/22 1314 03/14/22 0322 03/14/22 1530 03/15/22 0317 03/16/22 0451  NA 143   < > 141 141 143  K 3.5   < > 4.1 3.5 3.0*  CL 105   < > 103 102 102  CO2 26   < > 28 26 30   GLUCOSE 143*   < > 101* 117* 103*  BUN 35*   < > 35* 36* 33*  CREATININE 1.37*   < > 1.60* 1.39* 1.40*  CALCIUM 9.0   < > 8.8* 8.8* 8.8*  MG 1.9  --   --   --   --   PROT  --   --   --  6.2* 6.3*  ALBUMIN  --   --   --  3.3* 3.3*  AST  --   --   --  51* 44*  ALT  --   --   --  41 36  ALKPHOS  --   --   --  78 75  BILITOT  --   --   --  1.1 1.1  GFRNONAA 39*   < > 32* 38* 38*  ANIONGAP 12   < > 10 13 11    < > = values in this interval not displayed.    Lipids No results for input(s): "CHOL", "TRIG", "HDL", "LABVLDL", "LDLCALC", "CHOLHDL" in the last 168 hours.  Hematology Recent Labs  Lab 03/13/22 1314 03/15/22 0317 03/16/22 0451  WBC 6.1 4.4 5.6  RBC 3.95 3.92 4.12  HGB 12.2 12.0 12.5  HCT 39.4 38.3 39.6  MCV 99.7 97.7 96.1  MCH 30.9 30.6 30.3  MCHC 31.0 31.3 31.6  RDW 15.6* 15.9* 15.6*  PLT 206 205 215   Thyroid No results for input(s): "TSH", "FREET4" in the last 168 hours.  BNP Recent Labs  Lab 03/13/22 1314 03/15/22 0900  BNP >4,500.0* >4,500.0*    DDimer No results for input(s): "DDIMER" in the last 168 hours.   Radiology    No results found.  Cardiac Studies   TTE 02/15/22 1. Left ventricular ejection fraction, by estimation, is <20%. The left  ventricle has severely decreased function. The left ventricle has no  regional wall motion abnormalities. The left ventricular  internal cavity  size was mildly dilated. Left  ventricular diastolic parameters are consistent with Grade II diastolic  dysfunction (pseudonormalization). There is the interventricular septum is  flattened in systole and diastole, consistent with right ventricular  pressure and volume overload.No  evidence of LV thrombus.   2. Right ventricular systolic function is moderately reduced. The right  ventricular size is mildly enlarged. There is severely elevated pulmonary  artery systolic pressure.   3. Left atrial size was moderately dilated.   4. Right atrial size was moderately dilated.   5. The mitral valve is abnormal. Mild to moderate mitral valve  regurgitation. No evidence of mitral stenosis.   6. The aortic valve is abnormal. Aortic valve regurgitation is not  visualized. Aortic valve sclerosis/calcification is present, without any  evidence of aortic stenosis.   7. The inferior vena cava is dilated in size with >50% respiratory  variability, suggesting right atrial pressure of 8 mmHg.      R/LHC 10/27/21   Prox RCA to Mid RCA lesion is 25% stenosed.   Dist RCA lesion is 25% stenosed.   LV end diastolic pressure is moderately elevated.   Hemodynamic findings consistent with moderate pulmonary hypertension.   There is no aortic valve stenosis.   Aortic saturation 93%, PA saturation 58%, PA pressure 54/18, mean PA pressure 35 mmHg, mean pulmonary capillary wedge pressure 18 mmHg, cardiac output 3.87 L/min, cardiac index 2.24.   Mild to moderate pulmonary artery hypertension.  Evidence of mild volume overload.   Mild, nonobstructive coronary artery disease.   Nonischemic cardiomyopathy.  Continue medical therapy.  Consider low dose diuretic.   Patient Profile     82 y.o. female with a past medical history of hypertension, chronic left bundle branch block on EKG, COPD, prior CVA, HFrEF with severely reduced LVEF 15-20%, OSA with CPAP, CKD stage III who has been seen and  evaluated for acute on chronic HFrEF.  Assessment & Plan    Acute on chronic HFrEF/ischemic cardiomyopathy -LVEF less than 20% on last echocardiogram -BNP  on admission was greater than 45,000 as well repeat - -1.9 L output in the last 24 hours -Minimal improvement in bilateral lower extremity edema -Furosemide drip increased to 20 mg/h -Daily BMP while on diuretic therapy -Continue with heart failure education -Daily weights, I's and O's, low-sodium diet  Pulmonary hypertension -Severely elevated pulmonary artery systolic pressure on echocardiogram -Continues to diurese his blood pressure and kidney function allows  Essential hypertension -Blood pressure 123/84 -Continued on furosemide drip with increased to 20 mg/h -PTA blood pressure medications remain on hold to allow for adequate diuresis -Vital signs per unit protocol  CKD stage III -Serum creatinine 1.40 -Likely cardiorenal -Continue with diuresis to creatinine crease and then transition back to oral diuretics -Monitor urine output -Monitor/trend/replete electrolytes as needed -Daily BMP -Avoid nephrotoxic agents were able  Hypokalemia -Serum potassium 3.0 today -Supplementations have been ordered -Daily BMP -Monitor/trend/replete electrolytes as needed -Recommend keeping potassium level close to 4  OSA -Recommend to use CPAP nightly  History of CVA -Continued on 81 mg of aspirin daily -Continue atorvastatin 10 mg daily -LDL goal less than 70     For questions or updates, please contact [facility] Please consult [email or web address] for contact info under        Signed, Pamelia Botto, NP  03/16/2022, 9:09 AM

## 2022-03-16 NOTE — Consult Note (Addendum)
Advanced Heart Failure Team Consult Note   Primary Physician: [name], MD PCP-Cardiologist:  Reatha Harps, MD  Reason for Consultation: Acute on Chronic Systolic Heart Failure, Concern for Low-Output   HPI:    Heather [name] is seen today for evaluation of acute on chronic systolic heart failure and concern for low output, at the request of Dr. Jenene Slicker, Cardiology.   82 y/o [name] w/ h/o CKD IIIb, LBBB, R cerebellar CVA and chronic biventricular heart failure, first diagnosed 10/2021. Echo then showed severely reduced LVEF 15-20%, RV mildly reduced, mild-mod MR. Echo also showed abnormal paradoxical septal motion c/w LBBB. Of note, prior echo in 2017 showed normal LVEF w/ septal dyssynchrony and normal RV.  She underwent [facility] 10/2021 which showed mild nonobstructive CAD w/ only 25% mRCA stenosis. 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  GDMT regimen included Farxiga, Imdur, Bisoprolol. Diuretic regimen Lasix 20 mg daily.   Had recent repeat echo 12/23. LVEF still severely reduced < 15%, progression of RV dysfunction (mod reduced) and severely elevated PA pressure w/ estimated RVSP 68 mmHg.    Over the last 2 months she started to notice bilateral leg swelling and progressively worsening SOB.  Worse over the last 2 weeks.  She has had some difficulty ambulating due to leg heaviness.  She lives home alone.  Does all ADLs without help.  Drives.  Reports compliance with meds but does state that she occasionally misses doses if she forgets.  Prior to admission she uptitrated her Lasix with no response. she does not wear oxygen at home.  Denies ETOH, smoking and illicit drug use.  Eats out occasionally her go to is fried chicken.  Denies chest pain  Now admitted to [facility] w/ a/c CHF w/ volume overload. Started on lasix gtt, diuresing though not robust. SCr 1.60>>1.40 (b/l ~1.4). CO2 30. Placed on  empiric milrinone today to argument diureses. All other GDMT on hold w/ soft BP. Transferring to [facility] for further management and AHF consultation.   On assessment stable in bed.  Denies chest pain.  Breathing fine on nasal cannula.  Home Medications Prior to Admission medications   Medication Sig Start Date End Date Taking? Authorizing Provider  acetaminophen (TYLENOL) 500 MG tablet Take 500 mg by mouth every 6 (six) hours as needed.   Yes [provider]  allopurinol (ZYLOPRIM) 300 MG tablet Take 0.5 tablets (150 mg total) by mouth daily. 11/03/21  Yes [name], MD  atorvastatin (LIPITOR) 10 MG tablet Take 1 tablet (10 mg total) by mouth daily. 03/13/22  Yes [name], MD  bisoprolol (ZEBETA) 5 MG tablet Take 0.5 tablets (2.5 mg total) by mouth daily. 01/05/22  Yes O'Neal, Ronnald Ramp, MD  chlorpheniramine (CHLOR-TRIMETON) 4 MG tablet Take 4 mg by mouth daily as needed for allergies.   Yes [provider]  Cholecalciferol (DIALYVITE VITAMIN D 5000 PO) Take by mouth.   Yes [provider]  dapagliflozin propanediol (FARXIGA) 10 MG TABS tablet Take 1 tablet (10 mg total) by mouth daily. 01/05/22  Yes O'Neal, Ronnald Ramp, MD  famotidine (PEPCID) 20 MG tablet Take 1 tablet (20 mg total) by mouth at bedtime. One after supper 10/10/21  Yes Vassie Loll, MD  Fluticasone-Umeclidin-Vilant (TRELEGY ELLIPTA) 100-62.5-25 MCG/ACT AEPB Inhale 1 puff into the lungs daily. 11/14/21  Yes Coralyn Helling, MD  furosemide (LASIX) 40 MG tablet Take 0.5  tablets (20 mg total) by mouth daily. 02/02/22  Yes Johnette Abraham, MD  HYDROcodone-acetaminophen (NORCO/VICODIN) 5-325 MG tablet Take 1 tablet by mouth every 6 (six) hours as needed for moderate pain.   Yes [provider]  isosorbide mononitrate (IMDUR) 30 MG 24 hr tablet Take 1 tablet (30 mg total) by mouth daily. 01/05/22  Yes O'Neal, Cassie Freer, MD  ketoconazole (NIZORAL) 2 % cream Apply 1 application topically  daily. 01/18/21  Yes Lorenda Peck, DPM  Multiple Vitamins-Minerals (MULTIVITAMIN WITH MINERALS) tablet Take 1 tablet by mouth daily.   Yes [provider]  PROAIR HFA 108 (90 Base) MCG/ACT inhaler Inhale 2 puffs into the lungs every 6 (six) hours as needed for wheezing or shortness of breath. 10/10/21  Yes Barton Dubois, MD  venlafaxine XR (EFFEXOR-XR) 37.5 MG 24 hr capsule Take 37.5 mg by mouth See admin instructions. Take 2 capsules by mouth daily 11/29/20  Yes [provider]  zolpidem (AMBIEN) 10 MG tablet TAKE (1) TABLET BY MOUTH AT BEDTIME AS NEEDED FOR SLEEP Patient taking differently: Take 10 mg by mouth at bedtime as needed for sleep. 11/16/21  Yes Chesley Mires, MD    Past Medical History: Past Medical History:  Diagnosis Date   Anemia    Anxiety    Arthritis    COPD (chronic obstructive pulmonary disease) ([place])    CVA (cerebral vascular accident) ([place])    Depression    Dry eyes 04/14/2014   GERD (gastroesophageal reflux disease)    Glaucoma    Gout    HTN (hypertension)    OSA (obstructive sleep apnea)     Past Surgical History: Past Surgical History:  Procedure Laterality Date   ABDOMINAL HYSTERECTOMY     BACK SURGERY     lumbar-disc   BUNIONECTOMY Bilateral    FOOT SURGERY Left    repair of "kissing Cousins"   JOINT REPLACEMENT Bilateral    hip    JOINT REPLACEMENT Right 2010 or 2012   knee   KNEE SURGERY     RIGHT/LEFT HEART CATH AND CORONARY ANGIOGRAPHY N/A 10/27/2021   Procedure: RIGHT/LEFT HEART CATH AND CORONARY ANGIOGRAPHY;  Surgeon: Jettie Booze, MD;  Location: [facility];  Service: Cardiovascular;  Laterality: N/A;   SHOULDER ARTHROSCOPY Right    shoulder surgery in [place] about 2000   SHOULDER HEMI-ARTHROPLASTY Left 03/25/2014   Procedure: LEFT SHOULDER HEMI-ARTHROPLASTY;  Surgeon: Carole Civil, MD;  Location: [facility];  Service: Orthopedics;  Laterality: Left;    Family History: Family History  Problem  Relation Age of Onset   High blood pressure Mother    Aneurysm Father    Diabetes Brother    Pancreatitis Brother    Hypertension Niece    Congestive Heart Failure Niece     Social History: Social History   Socioeconomic History   Marital status: Divorced    Spouse name: Not on file   Number of children: Not on file   Years of education: Not on file   Highest education level: Not on file  Occupational History   Not on file  Tobacco Use   Smoking status: Former    Packs/day: 1.00    Years: 40.00    Total pack years: 40.00    Types: Cigarettes    Quit date: 03/20/1985    Years since quitting: 37.0   Smokeless tobacco: Never  Vaping Use   Vaping Use: Never used  Substance and Sexual Activity   Alcohol use: No  Drug use: No   Sexual activity: Yes    Birth control/protection: Surgical  Other Topics Concern   Not on file  Social History Narrative   Mrs Quillin is a 82 year old divorced female who lives alone. She has support of her son and sister.    Social Determinants of Health   Financial Resource Strain: High Risk (11/08/2021)   Overall Financial Resource Strain (CARDIA)    Difficulty of Paying Living Expenses: Very hard  Food Insecurity: No Food Insecurity (03/13/2022)   Hunger Vital Sign    Worried About Running Out of Food in the Last Year: Never true    Ran Out of Food in the Last Year: Never true  Transportation Needs: Unmet Transportation Needs (03/15/2022)   PRAPARE - Transportation    Lack of Transportation (Medical): No    Lack of Transportation (Non-Medical): Yes  Physical Activity: Sufficiently Active (11/08/2021)   Exercise Vital Sign    Days of Exercise per Week: 7 days    Minutes of Exercise per Session: 40 min  Stress: No Stress Concern Present (11/08/2021)   [place]    Feeling of Stress : Not at all  Social Connections: Moderately Isolated (11/08/2021)   Social Connection and Isolation  Panel [NHANES]    Frequency of Communication with Friends and Family: Twice a week    Frequency of Social Gatherings with Friends and Family: Once a week    Attends Religious Services: More than 4 times per year    Active Member of [employer] or Organizations: No    Attends Archivist Meetings: Never    Marital Status: Widowed    Allergies:  Allergies  Allergen Reactions   Lovastatin Swelling    Patient states that her tongue swells and she gets a tingling feeling all over. Patient states that her tongue swells and she gets a tingling feeling all over.   Lisinopril     Objective:    Vital Signs:   Temp:  [98 F (36.7 C)-98.2 F (36.8 C)] 98 F (36.7 C) (01/11 0426) Pulse Rate:  [81-96] 96 (01/11 0832) Resp:  [17-19] 18 (01/11 0832) BP: (121-124)/(70-84) 123/84 (01/11 0426) SpO2:  [93 %-100 %] 93 % (01/11 0832) Weight:  [73.1 kg] 73.1 kg (01/11 0621) Last BM Date : 03/13/22  Weight change: Filed Weights   03/13/22 2112 03/15/22 0536 03/16/22 0621  Weight: 77.2 kg 75.3 kg 73.1 kg    Intake/Output:   Intake/Output Summary (Last 24 hours) at 03/16/2022 1202 Last data filed at 03/16/2022 1039 Gross per 24 hour  Intake 387.7 ml  Output 2700 ml  Net -2312.3 ml      Physical Exam    General:  elderly appearing.  No respiratory difficulty HEENT: normal Neck: supple. JVD ~14 cm. Carotids 2+ bilat; no bruits. No lymphadenopathy or thyromegaly appreciated. Cor: PMI nondisplaced. Regular rate & rhythm. No rubs, gallops or murmurs. Lungs: clear Abdomen: soft, nontender, nondistended. No hepatosplenomegaly. No bruits or masses. Good bowel sounds. Extremities: no cyanosis, clubbing, rash, +2/3 BLE edema. PICC RUE Neuro: alert & oriented x 3, cranial nerves grossly intact. moves all 4 extremities w/o difficulty. Affect pleasant.   Telemetry   NSR 90s 1-2 PVCs w/ LBBB (Personally reviewed)    EKG    NSR w/ LBBB, QRS 180s   Labs   Basic Metabolic Panel: Recent  Labs  Lab 03/13/22 1314 03/14/22 0322 03/14/22 1530 03/15/22 0317 03/16/22 0451  NA 143 142  141 141 143  K 3.5 3.4* 4.1 3.5 3.0*  CL 105 106 103 102 102  CO2 26 26 28 26 30   GLUCOSE 143* 98 101* 117* 103*  BUN 35* 35* 35* 36* 33*  CREATININE 1.37* 1.31* 1.60* 1.39* 1.40*  CALCIUM 9.0 8.5* 8.8* 8.8* 8.8*  MG 1.9  --   --   --   --     Liver Function Tests: Recent Labs  Lab 03/15/22 0317 03/16/22 0451  AST 51* 44*  ALT 41 36  ALKPHOS 78 75  BILITOT 1.1 1.1  PROT 6.2* 6.3*  ALBUMIN 3.3* 3.3*   No results for input(s): "LIPASE", "AMYLASE" in the last 168 hours. No results for input(s): "AMMONIA" in the last 168 hours.  CBC: Recent Labs  Lab 03/13/22 1314 03/15/22 0317 03/16/22 0451  WBC 6.1 4.4 5.6  HGB 12.2 12.0 12.5  HCT 39.4 38.3 39.6  MCV 99.7 97.7 96.1  PLT 206 205 215    Cardiac Enzymes: No results for input(s): "CKTOTAL", "CKMB", "CKMBINDEX", "TROPONINI" in the last 168 hours.  BNP: BNP (last 3 results) Recent Labs    10/07/21 1242 03/13/22 1314 03/15/22 0900  BNP 1,731.0* >4,500.0* >4,500.0*    ProBNP (last 3 results) No results for input(s): "PROBNP" in the last 8760 hours.   CBG: No results for input(s): "GLUCAP" in the last 168 hours.  Coagulation Studies: No results for input(s): "LABPROT", "INR" in the last 72 hours.   Imaging   No results found.   Medications:     Current Medications:  allopurinol  150 mg Oral Daily   aspirin  81 mg Oral Daily   atorvastatin  10 mg Oral Daily   Chlorhexidine Gluconate Cloth  6 each Topical Daily   famotidine  20 mg Oral QHS   fluticasone furoate-vilanterol  1 puff Inhalation Daily   And   umeclidinium bromide  1 puff Inhalation Daily   heparin  5,000 Units Subcutaneous Q8H   potassium chloride  40 mEq Oral Once    Infusions:  furosemide (LASIX) 200 mg in dextrose 5 % 100 mL (2 mg/mL) infusion 20 mg/hr (03/16/22 1018)   milrinone        Patient Profile   82 y/o [name] w/ h/o CKD  IIIb, LBBB, R cerebellar CVA, and chronic biventricular heart failure due to NICM, admitted for a/c CHF w/ concern for low-output. Transferred from [place] for further management and AHF consultation.   Assessment/Plan   1. Acute on Chronic Systolic Heart Failure, NICM - NICM, LHC 8/23 w/ mild nonobstructive CAD. 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 Longstanding LBBB w/ septal dyssynchrony seen on echos dating back to 2017. EKG this admit w/ QRS duration 180 ms  - Remains volume overloaded on exam, continue diuresis w/ Lasix gtt + milrinone .25 - PICC just placed. Check co-ox and CVP - plan repeat RHC after diuresis  - consult EP for CRT device. Given age would opt for CRT-P  - GDMT as renal fx and BP allows  - Place UNNA boots - Strict I&O, daily weights   2. LBBB - QRS duration 180 ms  - cath 8/23 w/o significant CAD - recommend CRT-P per above   3. CKD IIIb - b/l SCr  1.40  - 1.60>>>1.40>>1.53 today  - follow closely w/ diuresis   4. Hypokalemia - K 3.4 - supp K w/ diuresis - recheck Mg level, last 1.9  5. Hx Rt cerebellar CVA - on ASA - LDL 92, goal <55 - Continue statin    Length of Stay: [place], AGACNP-BC   03/16/2022, 12:02 PM  Advanced Heart Failure Team Pager [phone] (M-F; 7a - 5p)  Please contact [facility] for night-coverage after hours (4p -7a ) and weekends on [email or web address]  Patient seen with NP, agree with the above note.   History as noted above.  Patient has been found to have a nonischemic cardiomyopathy since 8/23.  She was admitted because of volume overload and dyspnea.  She was started milrinone 0.25 and Lasix gtt increased to 20 mg/hr.  He diuresed well overnight with I/Os net negative 2448.  No labs yet today.   PICC just placed, no co-ox yet but CVP is 6-7.   General: NAD Neck: No JVD, no thyromegaly or thyroid nodule.  Lungs: Clear to auscultation bilaterally with normal respiratory effort. CV: Nondisplaced PMI.  Heart regular S1/S2, no S3/S4, no murmur.  2+ edema to knees.  No carotid bruit.  Normal pedal pulses.  Abdomen: Soft, nontender, no hepatosplenomegaly, no distention.  Skin: Intact without lesions or rashes.  Neurologic: Alert and oriented x 3.  Psych: Normal affect. Extremities: No clubbing or cyanosis.  HEENT: Normal.   1. 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 Cath in 8/23 with no significant CAD, CI 2.24. ?LBBB CMP versus familial versus prior myocarditis.  Last echo in 12/23 with EF < 20%, moderately reduced RV function, D-shaped septum, mild-moderate MR. She was admitted to [facility] with volume overload, empirically started on milrinone 0.25 and also started on Lasix gtt 20 mg/hr.  She diuresed well, CVP down to 6-7 today.  No co-ox yet as PICC just placed.  She has significant peripheral edema still but suspect venous insufficiency plays a large role here.  - Stop Lasix gtt with low CVP and start torsemide 20 mg daily.  - Start spironolactone 12.5 daily.  - Start digoxin 0.125 daily.  - Start Farxiga 10 mg daily.  - If co-ox looks good this morning, will start weaning milrinone.  - Wide LBBB, she would benefit eventually from CRT.  - Unna boots for peripheral edema.  2. CKD stage 3: Last creatinine 1.5, pending today.  3. H/o right cerebellar CVA: On ASA 81 and statin.  4. H/o COPD: Prior smoker.   Loralie Champagne 03/17/2022 12:31 PM

## 2022-03-16 NOTE — Progress Notes (Signed)
PROGRESS NOTE    Heather [name]  IHK:[id number] DOB: 03-Jun-1940 DOA: 03/13/2022 PCP: [name], MD   Brief Narrative:  This 82 years old female with PMH significant for systolic CHF, hypertension, COPD, OSA, CVA, CKD stage IIIa presented in the ED with complaints of bilateral lower extremity swelling that has progressed over the last 1 month.  She used to take Lasix 20 mg daily but the dose has gradually been increased to 60 mg daily without any improvement.  Her thighs feels so swollen and now has developed difficulty breathing with exertion.  She denies any chest pain. In the ED workup reveals BNP greater than 4500.  Chest x-ray shows bilateral pleural effusions, left lung base opacity and central vascular congestion consistent with CHF exacerbation.  Patient is admitted for further evaluation.  Cardiology is consulted,  Patient is started on Lasix drip.  Assessment & Plan:   Principal Problem:   Acute on chronic systolic CHF (congestive heart failure) (HCC) Active Problems:   Stage 3a chronic kidney disease (HCC)   Essential hypertension   Gout   COPD GOLD 1    OSA on CPAP   Prolonged QT interval  Acute on chronic systolic CHF: Patient presented with bilateral lower extremity swelling, SOB on exertion, orthopnea,  BNP 4500,  Chest x-ray showing mild vascular congestion and small pleural effusion. Recent echo showsed LVEF less than 20%. Patient was taking Lasix 20 mg daily which has gradually been increased to 60 mg daily without any improvement. Initiated on Lasix 40 mg IV 12 hours, Obtain daily weight, intake output charting's, daily BMP Hold Farxiga while inpatient. Cardiology is consulted, Patient is started on Lasix drip. She still appears fluid overloaded despite being on 15 mcg/ h lasix infusion..   Urine output in last 24 hours 2.5 L net negative balance 1.8 L Lasix dose increased from 15-20 mcg/hr, started on milrinone infusion. She is moved to ICU for milrinone  infusion. Probably need to be transferred to [facility] for advanced heart failure management. Hold beta-blockers until she is more volume compensated.   Intake/Output Summary (Last 24 hours) at 03/16/2022 1145 Last data filed at 03/16/2022 1039 Gross per 24 hour  Intake 387.7 ml  Output 2700 ml  Net -2312.3 ml     CKD Stage IIIA: Serum creatinine at baseline. Avoid nephrotoxic medications.  Prolonged QT interval: QTc 536, potassium 3.5 Replace potassium and magnesium. Hold venlafaxine.  COPD: Stable,  does not appear in exacerbation. Continue home bronchodilator regimen.  Acute Gout: Continue allopurinol.  Essential hypertension: Blood pressure is stable Hold bisoprolol and Imdur given decompensated heart failure.  History of CVA: Continue aspirin and Lipitor.  DVT prophylaxis: Lovenox Code Status: Full code. Family Communication: No family at bed side. Disposition Plan:   Status is: Inpatient Remains inpatient appropriate because: Admitted for acute systolic CHF requiring IV diuresis.  Cardiology is consulted, started on Lasix drip   Consultants:  Cardiology  Procedures: Echo  Antimicrobials: None.  Subjective: Patient was seen and examined at bedside.  Overnight events noted.   Patient has very minimal improvement in her symptoms despite being on Lasix drip. She is still not able to lie flat on the bed,  still has similar swelling in both lower extremities.  Objective: Vitals:   03/15/22 2034 03/16/22 0426 03/16/22 0621 03/16/22 0832  BP: 124/70 123/84    Pulse: 90 81  96  Resp: 19 19  18   Temp: 98 F (36.7 C) 98 F (36.7 C)  TempSrc:      SpO2: 96% 97%  93%  Weight:   73.1 kg   Height:        Intake/Output Summary (Last 24 hours) at 03/16/2022 1145 Last data filed at 03/16/2022 1039 Gross per 24 hour  Intake 387.7 ml  Output 2700 ml  Net -2312.3 ml   Filed Weights   03/13/22 2112 03/15/22 0536 03/16/22 0621  Weight: 77.2 kg 75.3 kg  73.1 kg    Examination:  General exam: Appears comfortable, not in any acute distress.  Deconditioned Respiratory system: Bibasilar crackles noted, respiratory effort normal, no accessory muscle use. Cardiovascular system: S1 & S2 heard, regular rate and rhythm, no murmur. Gastrointestinal system: Abdomen is soft, non tender, non distended, BS+ Central nervous system: Alert and oriented x 3. No focal neurological deficits. Extremities: Significant bipedal edema, no cyanosis, no clubbing. Skin: No rashes, lesions or ulcers Psychiatry: Judgement and insight appear normal. Mood & affect appropriate.   Data Reviewed: I have personally reviewed following labs and imaging studies  CBC: Recent Labs  Lab 03/13/22 1314 03/15/22 0317 03/16/22 0451  WBC 6.1 4.4 5.6  HGB 12.2 12.0 12.5  HCT 39.4 38.3 39.6  MCV 99.7 97.7 96.1  PLT 206 205 353   Basic Metabolic Panel: Recent Labs  Lab 03/13/22 1314 03/14/22 0322 03/14/22 1530 03/15/22 0317 03/16/22 0451  NA 143 142 141 141 143  K 3.5 3.4* 4.1 3.5 3.0*  CL 105 106 103 102 102  CO2 26 26 28 26 30   GLUCOSE 143* 98 101* 117* 103*  BUN 35* 35* 35* 36* 33*  CREATININE 1.37* 1.31* 1.60* 1.39* 1.40*  CALCIUM 9.0 8.5* 8.8* 8.8* 8.8*  MG 1.9  --   --   --   --    GFR: Estimated Creatinine Clearance: 30.7 mL/min (A) (by C-G formula based on SCr of 1.4 mg/dL (H)). Liver Function Tests: Recent Labs  Lab 03/15/22 0317 03/16/22 0451  AST 51* 44*  ALT 41 36  ALKPHOS 78 75  BILITOT 1.1 1.1  PROT 6.2* 6.3*  ALBUMIN 3.3* 3.3*   No results for input(s): "LIPASE", "AMYLASE" in the last 168 hours. No results for input(s): "AMMONIA" in the last 168 hours. Coagulation Profile: No results for input(s): "INR", "PROTIME" in the last 168 hours. Cardiac Enzymes: No results for input(s): "CKTOTAL", "CKMB", "CKMBINDEX", "TROPONINI" in the last 168 hours. BNP (last 3 results) No results for input(s): "PROBNP" in the last 8760 hours. HbA1C: No  results for input(s): "HGBA1C" in the last 72 hours. CBG: No results for input(s): "GLUCAP" in the last 168 hours. Lipid Profile: No results for input(s): "CHOL", "HDL", "LDLCALC", "TRIG", "CHOLHDL", "LDLDIRECT" in the last 72 hours. Thyroid Function Tests: No results for input(s): "TSH", "T4TOTAL", "FREET4", "T3FREE", "THYROIDAB" in the last 72 hours. Anemia Panel: No results for input(s): "VITAMINB12", "FOLATE", "FERRITIN", "TIBC", "IRON", "RETICCTPCT" in the last 72 hours. Sepsis Labs: No results for input(s): "PROCALCITON", "LATICACIDVEN" in the last 168 hours.  No results found for this or any previous visit (from the past 240 hour(s)).   Radiology Studies: No results found.   Scheduled Meds:  allopurinol  150 mg Oral Daily   aspirin  81 mg Oral Daily   atorvastatin  10 mg Oral Daily   famotidine  20 mg Oral QHS   fluticasone furoate-vilanterol  1 puff Inhalation Daily   And   umeclidinium bromide  1 puff Inhalation Daily   heparin  5,000 Units Subcutaneous Q8H   potassium chloride  40 mEq Oral Once   Continuous Infusions:  furosemide (LASIX) 200 mg in dextrose 5 % 100 mL (2 mg/mL) infusion 20 mg/hr (03/16/22 1018)   milrinone       LOS: 3 days    Time spent: 35 mins    Annalea Alguire, MD [facility]   If 7PM-7AM, please contact night-coverage

## 2022-03-17 ENCOUNTER — Inpatient Hospital Stay (HOSPITAL_BASED_OUTPATIENT_CLINIC_OR_DEPARTMENT_OTHER): Payer: Medicare Other

## 2022-03-17 DIAGNOSIS — I429 Cardiomyopathy, unspecified: Secondary | ICD-10-CM | POA: Diagnosis not present

## 2022-03-17 DIAGNOSIS — I5023 Acute on chronic systolic (congestive) heart failure: Secondary | ICD-10-CM | POA: Diagnosis not present

## 2022-03-17 LAB — COMPREHENSIVE METABOLIC PANEL
ALT: 28 U/L (ref 0–44)
AST: 37 U/L (ref 15–41)
Albumin: 3 g/dL — ABNORMAL LOW (ref 3.5–5.0)
Alkaline Phosphatase: 66 U/L (ref 38–126)
Anion gap: 11 (ref 5–15)
BUN: 22 mg/dL (ref 8–23)
CO2: 32 mmol/L (ref 22–32)
Calcium: 8.7 mg/dL — ABNORMAL LOW (ref 8.9–10.3)
Chloride: 97 mmol/L — ABNORMAL LOW (ref 98–111)
Creatinine, Ser: 1.42 mg/dL — ABNORMAL HIGH (ref 0.44–1.00)
GFR, Estimated: 37 mL/min — ABNORMAL LOW (ref >60)
Glucose, Bld: 146 mg/dL — ABNORMAL HIGH (ref 70–99)
Potassium: 3.1 mmol/L — ABNORMAL LOW (ref 3.5–5.1)
Sodium: 140 mmol/L (ref 135–145)
Total Bilirubin: 1.1 mg/dL (ref 0.3–1.2)
Total Protein: 5.5 g/dL — ABNORMAL LOW (ref 6.5–8.1)

## 2022-03-17 LAB — CBC
HCT: 37.8 % (ref 36.0–46.0)
Hemoglobin: 12.2 g/dL (ref 12.0–15.0)
MCH: 30.6 pg (ref 26.0–34.0)
MCHC: 32.3 g/dL (ref 30.0–36.0)
MCV: 94.7 fL (ref 80.0–100.0)
Platelets: 200 10*3/uL (ref 150–400)
RBC: 3.99 MIL/uL (ref 3.87–5.11)
RDW: 15.1 % (ref 11.5–15.5)
WBC: 5.6 10*3/uL (ref 4.0–10.5)
nRBC: 0 % (ref 0.0–0.2)

## 2022-03-17 LAB — BASIC METABOLIC PANEL
Anion gap: 10 (ref 5–15)
BUN: 22 mg/dL (ref 8–23)
CO2: 31 mmol/L (ref 22–32)
Calcium: 8.3 mg/dL — ABNORMAL LOW (ref 8.9–10.3)
Chloride: 95 mmol/L — ABNORMAL LOW (ref 98–111)
Creatinine, Ser: 1.42 mg/dL — ABNORMAL HIGH (ref 0.44–1.00)
GFR, Estimated: 37 mL/min — ABNORMAL LOW (ref >60)
Glucose, Bld: 250 mg/dL — ABNORMAL HIGH (ref 70–99)
Potassium: 2.9 mmol/L — ABNORMAL LOW (ref 3.5–5.1)
Sodium: 136 mmol/L (ref 135–145)

## 2022-03-17 LAB — COOXEMETRY PANEL
Carboxyhemoglobin: 1.9 % — ABNORMAL HIGH (ref 0.5–1.5)
Methemoglobin: 0.7 % (ref 0.0–1.5)
O2 Saturation: 73.8 %
Total hemoglobin: 11.9 g/dL — ABNORMAL LOW (ref 12.0–16.0)

## 2022-03-17 LAB — MAGNESIUM: Magnesium: 1.6 mg/dL — ABNORMAL LOW (ref 1.7–2.4)

## 2022-03-17 LAB — PHOSPHORUS: Phosphorus: 3.1 mg/dL (ref 2.5–4.6)

## 2022-03-17 MED ORDER — DIGOXIN 125 MCG PO TABS
0.1250 mg | ORAL_TABLET | Freq: Every day | ORAL | Status: DC
  Administered 2022-03-17 – 2022-03-20 (×4): 0.125 mg via ORAL
  Filled 2022-03-17 (×4): qty 1

## 2022-03-17 MED ORDER — GADOBUTROL 1 MMOL/ML IV SOLN
9.0000 mL | Freq: Once | INTRAVENOUS | Status: AC | PRN
  Administered 2022-03-17: 9 mL via INTRAVENOUS

## 2022-03-17 MED ORDER — SODIUM CHLORIDE 0.9% FLUSH: 10.0000 mL | INTRAVENOUS | Status: DC | PRN

## 2022-03-17 MED ORDER — DAPAGLIFLOZIN PROPANEDIOL 10 MG PO TABS
10.0000 mg | ORAL_TABLET | Freq: Every day | ORAL | Status: DC
  Administered 2022-03-17 – 2022-03-20 (×4): 10 mg via ORAL
  Filled 2022-03-17 (×4): qty 1

## 2022-03-17 MED ORDER — POTASSIUM CHLORIDE CRYS ER 20 MEQ PO TBCR
40.0000 meq | EXTENDED_RELEASE_TABLET | Freq: Four times a day (QID) | ORAL | Status: AC
  Administered 2022-03-17 (×2): 40 meq via ORAL
  Filled 2022-03-17 (×2): qty 2

## 2022-03-17 MED ORDER — SPIRONOLACTONE 12.5 MG HALF TABLET
12.5000 mg | ORAL_TABLET | Freq: Every day | ORAL | Status: DC
  Administered 2022-03-17 – 2022-03-18 (×2): 12.5 mg via ORAL
  Filled 2022-03-17 (×2): qty 1

## 2022-03-17 MED ORDER — TORSEMIDE 20 MG PO TABS
20.0000 mg | ORAL_TABLET | Freq: Every day | ORAL | Status: DC
  Administered 2022-03-17 – 2022-03-18 (×2): 20 mg via ORAL
  Filled 2022-03-17 (×2): qty 1

## 2022-03-17 MED ORDER — SODIUM CHLORIDE 0.9% FLUSH
10.0000 mL | Freq: Two times a day (BID) | INTRAVENOUS | Status: DC
  Administered 2022-03-17: 10 mL
  Administered 2022-03-17: 20 mL
  Administered 2022-03-18 – 2022-03-20 (×5): 10 mL

## 2022-03-17 NOTE — Care Management Important Message (Signed)
Important Message  Patient Details  Name: Heather [name] MRN: [id number] Date of Birth: 1940/11/06   Medicare Important Message Given:  Yes     Orbie Pyo 03/17/2022, 2:07 PM

## 2022-03-17 NOTE — Progress Notes (Signed)
Peripherally Inserted Central Catheter Placement  The IV Nurse has discussed with the patient and/or persons authorized to consent for the patient, the purpose of this procedure and the potential benefits and risks involved with this procedure.  The benefits include less needle sticks, lab draws from the catheter, and the patient may be discharged home with the catheter. Risks include, but not limited to, infection, bleeding, blood clot (thrombus formation), and puncture of an artery; nerve damage and irregular heartbeat and possibility to perform a PICC exchange if needed/ordered by physician.  Alternatives to this procedure were also discussed.  Bard Power PICC patient education guide, fact sheet on infection prevention and patient information card has been provided to patient /or left at bedside.    PICC Placement Documentation  PICC Double Lumen 03/17/22 Right Brachial 39 cm 0 cm (Active)  Indication for Insertion or Continuance of Line Prolonged intravenous therapies 03/17/22 1044  Exposed Catheter (cm) 0 cm 03/17/22 1044  Site Assessment Clean, Dry, Intact 03/17/22 1044  Lumen #1 Status Flushed;Saline locked;Blood return noted 03/17/22 1044  Lumen #2 Status Flushed;Blood return noted;Saline locked 03/17/22 1044  Dressing Type Transparent;Securing device 03/17/22 1044  Dressing Status Antimicrobial disc in place 03/17/22 [place] Not Applicable 34/19/62 2297  Dressing Change Due 03/24/22 03/17/22 [place] 03/17/2022, 10:47 AM

## 2022-03-17 NOTE — TOC Initial Note (Signed)
Transition of Care [facility]) - Initial/Assessment Note    Patient Details  Name: Heather [name] MRN: [id number] Date of Birth: May 18, 1940  Transition of [facility]) CM/SW Contact:    Erenest Rasher, RN Phone Number: [phone] 03/17/2022, 6:20 PM  Clinical Narrative:                  HF TOC CM spoke to pt and states she lives alone. Was independent PTA. Reports her son live here in [place].She was driving to appt and also has rides through her insurance. She states she has 24 one way rides. She has cane, RW, scale, and bedside commode at home.   HH arranged with [facility], will need HHRN order with [facility] for Disease Management.    Expected Discharge Plan: [place] Barriers to Discharge: Continued Medical Work up   Patient Goals and CMS Choice Patient states their goals for this hospitalization and ongoing recovery are:: return home   Choice offered to / list presented to : Patient [place] ownership interest in [facility] [place].provided to::  (n/a)    Expected Discharge Plan and Services In-house Referral: Clinical Social Work   Post Acute Care Choice: [place] arrangements for the past 2 months: Apartment                           HH Arranged: RN [place] Agency: [place] ([place]) Date [place]: 03/14/22 Time [place]: [phone] Representative spoke with at [place]: [place] Arrangements/Services Living arrangements for the past 2 months: [place] English with:: Self Patient language and need for interpreter reviewed:: Yes Do you feel safe going back to the place where you live?: Yes      Need for Family Participation in Patient Care: No (Comment) Care giver support system in place?: Yes (comment) Current home services: DME (walker, cane) Criminal Activity/Legal Involvement Pertinent to Current Situation/Hospitalization: No - Comment as needed  Activities of Daily Living Home  Assistive Devices/Equipment: Cane (specify quad or straight), Walker (specify type) ADL Screening (condition at time of admission) Patient's cognitive ability adequate to safely complete daily activities?: Yes Is the patient deaf or have difficulty hearing?: No Does the patient have difficulty seeing, even when wearing glasses/contacts?: No Does the patient have difficulty concentrating, remembering, or making decisions?: No Patient able to express need for assistance with ADLs?: Yes Does the patient have difficulty dressing or bathing?: No Independently performs ADLs?: Yes (appropriate for developmental age) Does the patient have difficulty walking or climbing stairs?: No Weakness of Legs: Both Weakness of Arms/Hands: None  Permission Sought/Granted Permission sought to share information with : Case Manager, Family Supports, PCP Permission granted to share information with : Yes, Verbal Permission Granted  Share Information with NAME: Particia Nearing     Permission granted to share info w Relationship: son  Permission granted to share info w Contact Information: [phone]  Emotional Assessment Appearance:: Appears stated age Attitude/Demeanor/Rapport: Engaged Affect (typically observed): Accepting Orientation: : Oriented to Self, Oriented to Place, Oriented to  Time, Oriented to Situation Alcohol / Substance Use: Not Applicable Psych Involvement: No (comment)  Admission diagnosis:  Acute on chronic systolic CHF (congestive heart failure) (HCC) [I50.23] Acute on chronic congestive heart failure, unspecified heart failure type [facility]) [I50.9] Patient Active Problem List   Diagnosis Date Noted   Acute on chronic systolic CHF (congestive heart failure) ([place]) 03/13/2022   Prolonged  QT interval 03/13/2022   Bloating 02/02/2022   Cerebral infarction, remote, resolved 11/04/2021   OSA on CPAP 69/67/8938   Acute systolic CHF (congestive heart failure) ([place]) 10/09/2021   SOBOE  (shortness of breath on exertion) 10/07/2021   Hyperkalemia 10/07/2021   Leg swelling 01/31/2021   Acute kidney injury ([place]) 12/14/2020   Tremor 06/21/2020   DOE (dyspnea on exertion) 05/25/2020   Stage 3a chronic kidney disease ([place]) 04/09/2020   Leg edema 02/03/2020   AKI (acute kidney injury) ([place]) 01/16/2020   Prediabetes 01/16/2020   Preventative health care 01/13/2020   Hyperlipidemia 01/13/2020   Anxiety 01/13/2020   COPD GOLD 1  01/13/2020   Gastroesophageal reflux disease 01/13/2020   Intertrigo 01/13/2020   Long-term current use of opiate analgesic 07/07/2019   Sacroiliac joint pain 07/07/2019   Bilateral carpal tunnel syndrome 05/10/2019   Chronic low back pain 05/10/2019   Lumbosacral radiculopathy 05/10/2019   Polyarthropathy 05/10/2019   Gout 10/24/2018   Essential hypertension 04/17/2014   Renal insufficiency 04/01/2014   Rotator cuff arthropathy    PCP:  Johnette Abraham, MD Pharmacy:   [facility], [place] [address] Phone: [phone] Fax: [phone]  [facility] - [place], [place] [address] Phone: [phone] Fax: [phone]     Social Determinants of Health (SDOH) Social History: [place]: No Food Insecurity (03/13/2022)  Housing: Low Risk  (03/13/2022)  Transportation Needs: Unmet Transportation Needs (03/15/2022)  Utilities: Not At Risk (03/13/2022)  Alcohol Screen: Low Risk  (11/08/2021)  Depression (PHQ2-9): Low Risk  (02/02/2022)  Financial Resource Strain: High Risk (11/08/2021)  Physical Activity: Sufficiently Active (11/08/2021)  Social Connections: Moderately Isolated (11/08/2021)  Stress: No Stress Concern Present (11/08/2021)  Tobacco Use: Medium Risk (03/13/2022)   SDOH Interventions: Transportation Interventions: Inpatient TOC   Readmission Risk Interventions     No data to display

## 2022-03-18 DIAGNOSIS — I5023 Acute on chronic systolic (congestive) heart failure: Secondary | ICD-10-CM | POA: Diagnosis not present

## 2022-03-18 LAB — CBC
HCT: 36.7 % (ref 36.0–46.0)
Hemoglobin: 12.1 g/dL (ref 12.0–15.0)
MCH: 31.4 pg (ref 26.0–34.0)
MCHC: 33 g/dL (ref 30.0–36.0)
MCV: 95.3 fL (ref 80.0–100.0)
Platelets: 176 10*3/uL (ref 150–400)
RBC: 3.85 MIL/uL — ABNORMAL LOW (ref 3.87–5.11)
RDW: 15.4 % (ref 11.5–15.5)
WBC: 4.8 10*3/uL (ref 4.0–10.5)
nRBC: 0 % (ref 0.0–0.2)

## 2022-03-18 LAB — COMPREHENSIVE METABOLIC PANEL
ALT: 25 U/L (ref 0–44)
AST: 30 U/L (ref 15–41)
Albumin: 2.8 g/dL — ABNORMAL LOW (ref 3.5–5.0)
Alkaline Phosphatase: 61 U/L (ref 38–126)
Anion gap: 11 (ref 5–15)
BUN: 28 mg/dL — ABNORMAL HIGH (ref 8–23)
CO2: 32 mmol/L (ref 22–32)
Calcium: 8.5 mg/dL — ABNORMAL LOW (ref 8.9–10.3)
Chloride: 99 mmol/L (ref 98–111)
Creatinine, Ser: 1.65 mg/dL — ABNORMAL HIGH (ref 0.44–1.00)
GFR, Estimated: 31 mL/min — ABNORMAL LOW (ref >60)
Glucose, Bld: 111 mg/dL — ABNORMAL HIGH (ref 70–99)
Potassium: 3.6 mmol/L (ref 3.5–5.1)
Sodium: 142 mmol/L (ref 135–145)
Total Bilirubin: 1 mg/dL (ref 0.3–1.2)
Total Protein: 5.5 g/dL — ABNORMAL LOW (ref 6.5–8.1)

## 2022-03-18 LAB — COOXEMETRY PANEL
Carboxyhemoglobin: 2.1 % — ABNORMAL HIGH (ref 0.5–1.5)
Methemoglobin: 0.7 % (ref 0.0–1.5)
O2 Saturation: 78.3 %
Total hemoglobin: 12.3 g/dL (ref 12.0–16.0)

## 2022-03-18 LAB — MAGNESIUM: Magnesium: 1.7 mg/dL (ref 1.7–2.4)

## 2022-03-18 MED ORDER — LOSARTAN POTASSIUM 25 MG PO TABS
12.5000 mg | ORAL_TABLET | Freq: Two times a day (BID) | ORAL | Status: DC
  Administered 2022-03-18 (×2): 12.5 mg via ORAL
  Filled 2022-03-18 (×2): qty 1

## 2022-03-18 MED ORDER — POTASSIUM CHLORIDE CRYS ER 20 MEQ PO TBCR
40.0000 meq | EXTENDED_RELEASE_TABLET | Freq: Once | ORAL | Status: AC
  Administered 2022-03-18: 40 meq via ORAL
  Filled 2022-03-18: qty 2

## 2022-03-18 MED ORDER — MAGNESIUM SULFATE 2 GM/50ML IV SOLN
2.0000 g | Freq: Once | INTRAVENOUS | Status: AC
  Administered 2022-03-18: 2 g via INTRAVENOUS
  Filled 2022-03-18: qty 50

## 2022-03-18 NOTE — Progress Notes (Signed)
Patient ID: Heather [name], female   DOB: 22-Feb-1941, 82 y.o.   MRN: [id number]     Advanced Heart Failure Rounding Note  PCP-Cardiologist: Evalina Field, MD   Subjective:    Breathing better. Leg edema improved.   Creatinine 1.53 => 1.65.  Co-ox 78%.  CVP 4 this morning.  She remains on milrinone 0.25 mcg/kg/min.    Objective:   Weight Range: 69.6 kg Body mass index is 27.18 kg/m.   Vital Signs:   Temp:  [97.9 F (36.6 C)-98.5 F (36.9 C)] 97.9 F (36.6 C) (01/13 0722) Pulse Rate:  [71-101] 71 (01/13 0722) Resp:  [16-22] 20 (01/13 0722) BP: (96-118)/(60-93) 108/75 (01/13 0722) SpO2:  [87 %-99 %] 95 % (01/13 0732) Weight:  [69.6 kg] 69.6 kg (01/13 0312) Last BM Date : 03/13/22  Weight change: Filed Weights   03/16/22 0621 03/17/22 0511 03/18/22 0312  Weight: 73.1 kg 69.8 kg 69.6 kg    Intake/Output:   Intake/Output Summary (Last 24 hours) at 03/18/2022 0938 Last data filed at 03/18/2022 5462 Gross per 24 hour  Intake 440 ml  Output --  Net 440 ml      Physical Exam    General:  Well appearing. No resp difficulty HEENT: Normal Neck: Supple. JVP not elevated. Carotids 2+ bilat; no bruits. No lymphadenopathy or thyromegaly appreciated. Cor: PMI nondisplaced. Regular rate & rhythm. No rubs, gallops or murmurs. Lungs: Clear Abdomen: Soft, nontender, nondistended. No hepatosplenomegaly. No bruits or masses. Good bowel sounds. Extremities: No cyanosis, clubbing, rash. 1+ ankle edema.  Neuro: Alert & orientedx3, cranial nerves grossly intact. moves all 4 extremities w/o difficulty. Affect pleasant   Telemetry   NSR in 100s with PVCs (personally reviewed)  Labs    CBC Recent Labs    03/16/22 0451 03/18/22 0545  WBC 5.6 4.8  HGB 12.5 12.1  HCT 39.6 36.7  MCV 96.1 95.3  PLT 215 703   Basic Metabolic Panel Recent Labs    03/17/22 1233 03/18/22 0545  NA 136 142  K 2.9* 3.6  CL 95* 99  CO2 31 32  GLUCOSE 250* 111*  BUN 22 28*  CREATININE 1.42*  1.65*  CALCIUM 8.3* 8.5*  MG  --  1.7   Liver Function Tests Recent Labs    03/16/22 1909 03/18/22 0545  AST 42* 30  ALT 32 25  ALKPHOS 74 61  BILITOT 1.3* 1.0  PROT 5.9* 5.5*  ALBUMIN 3.2* 2.8*   No results for input(s): "LIPASE", "AMYLASE" in the last 72 hours. Cardiac Enzymes No results for input(s): "CKTOTAL", "CKMB", "CKMBINDEX", "TROPONINI" in the last 72 hours.  BNP: BNP (last 3 results) Recent Labs    10/07/21 1242 03/13/22 1314 03/15/22 0900  BNP 1,731.0* >4,500.0* >4,500.0*    ProBNP (last 3 results) No results for input(s): "PROBNP" in the last 8760 hours.   D-Dimer No results for input(s): "DDIMER" in the last 72 hours. Hemoglobin A1C No results for input(s): "HGBA1C" in the last 72 hours. Fasting Lipid Panel No results for input(s): "CHOL", "HDL", "LDLCALC", "TRIG", "CHOLHDL", "LDLDIRECT" in the last 72 hours. Thyroid Function Tests No results for input(s): "TSH", "T4TOTAL", "T3FREE", "THYROIDAB" in the last 72 hours.  Invalid input(s): "FREET3"  Other results:   Imaging    No results found.   Medications:     Scheduled Medications:  allopurinol  150 mg Oral Daily   aspirin  81 mg Oral Daily   atorvastatin  10 mg Oral Daily   Chlorhexidine Gluconate Cloth  6 each Topical Daily   dapagliflozin propanediol  10 mg Oral Daily   digoxin  0.125 mg Oral Daily   famotidine  20 mg Oral QHS   fluticasone furoate-vilanterol  1 puff Inhalation Daily   And   umeclidinium bromide  1 puff Inhalation Daily   heparin  5,000 Units Subcutaneous Q8H   losartan  12.5 mg Oral BID   potassium chloride  40 mEq Oral Once   sodium chloride flush  10-40 mL Intracatheter Q12H   spironolactone  12.5 mg Oral Daily   torsemide  20 mg Oral Daily    Infusions:  milrinone 0.125 mcg/kg/min (03/18/22 0923)    PRN Medications: acetaminophen **OR** acetaminophen, albuterol, alum & mag hydroxide-simeth, HYDROcodone-acetaminophen, phenol, polyethylene glycol,  sodium chloride flush, zolpidem    Assessment/Plan   1. 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 Cath in 8/23 with no significant CAD, CI 2.24. ?LBBB CMP versus familial versus prior myocarditis.  Last echo in 12/23 with EF < 20%, moderately reduced RV function, D-shaped septum, mild-moderate MR. She was admitted to [facility] with volume overload, empirically started on milrinone 0.25 and also started on Lasix gtt 20 mg/hr.  She diuresed well, CVP down to 4 today.  Co-ox 78%. She has significant peripheral edema still but suspect venous insufficiency plays a large role here. Creatinine mildly higher 1.65.  - Hold torsemide today, start 20 mg daily tomorrow.  - Continue spironolactone 12.5 daily.  - Continue digoxin 0.125 daily.  - Continue Farxiga 10 mg daily.  - Decrease milrinone to 0.125 daily, hopefully stop tomorrow.  - Add losartan 12.5 mg bid.   - Wide LBBB, she would benefit eventually from CRT.  - Unna boots for peripheral edema.  2. CKD stage 3: Creatinine mildly higher at 1.65, holding torsemide with low CVP.  3. H/o right cerebellar CVA: On ASA 81 and statin.  4. H/o COPD: Prior smoker.   Length of Stay: [place], MD  03/18/2022, 9:38 AM  Advanced Heart Failure Team Pager [phone] (M-F; 7a - 5p)  Please contact [facility] for night-coverage after hours (5p -7a ) and weekends on [email or web address]

## 2022-03-18 NOTE — Progress Notes (Signed)
Orthopedic Tech Progress Note Patient Details:  Heather [name] 08-Dec-1940 [id number]  Ortho Devices Type of Ortho Device: Haematologist Ortho Device/Splint Location: Bi LE Ortho Device/Splint Interventions: Application   Post Interventions Patient Tolerated: Well  [name] 03/18/2022, 9:50 AM

## 2022-03-19 DIAGNOSIS — I5023 Acute on chronic systolic (congestive) heart failure: Secondary | ICD-10-CM | POA: Diagnosis not present

## 2022-03-19 LAB — COMPREHENSIVE METABOLIC PANEL
ALT: 22 U/L (ref 0–44)
AST: 31 U/L (ref 15–41)
Albumin: 2.7 g/dL — ABNORMAL LOW (ref 3.5–5.0)
Alkaline Phosphatase: 57 U/L (ref 38–126)
Anion gap: 9 (ref 5–15)
BUN: 25 mg/dL — ABNORMAL HIGH (ref 8–23)
CO2: 32 mmol/L (ref 22–32)
Calcium: 8.7 mg/dL — ABNORMAL LOW (ref 8.9–10.3)
Chloride: 100 mmol/L (ref 98–111)
Creatinine, Ser: 1.49 mg/dL — ABNORMAL HIGH (ref 0.44–1.00)
GFR, Estimated: 35 mL/min — ABNORMAL LOW (ref >60)
Glucose, Bld: 105 mg/dL — ABNORMAL HIGH (ref 70–99)
Potassium: 3.8 mmol/L (ref 3.5–5.1)
Sodium: 141 mmol/L (ref 135–145)
Total Bilirubin: 0.8 mg/dL (ref 0.3–1.2)
Total Protein: 5.4 g/dL — ABNORMAL LOW (ref 6.5–8.1)

## 2022-03-19 LAB — MAGNESIUM: Magnesium: 2.1 mg/dL (ref 1.7–2.4)

## 2022-03-19 LAB — CBC
HCT: 37.3 % (ref 36.0–46.0)
Hemoglobin: 12 g/dL (ref 12.0–15.0)
MCH: 30.8 pg (ref 26.0–34.0)
MCHC: 32.2 g/dL (ref 30.0–36.0)
MCV: 95.6 fL (ref 80.0–100.0)
Platelets: 170 10*3/uL (ref 150–400)
RBC: 3.9 MIL/uL (ref 3.87–5.11)
RDW: 15.2 % (ref 11.5–15.5)
WBC: 5.2 10*3/uL (ref 4.0–10.5)
nRBC: 0 % (ref 0.0–0.2)

## 2022-03-19 LAB — COOXEMETRY PANEL
Carboxyhemoglobin: 1.8 % — ABNORMAL HIGH (ref 0.5–1.5)
Methemoglobin: <0.7 % (ref 0.0–1.5)
O2 Saturation: 72.2 %
Total hemoglobin: 12.4 g/dL (ref 12.0–16.0)

## 2022-03-19 MED ORDER — SACUBITRIL-VALSARTAN 24-26 MG PO TABS: 1.0000 | ORAL_TABLET | Freq: Two times a day (BID) | ORAL | Status: DC

## 2022-03-19 MED ORDER — SPIRONOLACTONE 25 MG PO TABS
25.0000 mg | ORAL_TABLET | Freq: Every day | ORAL | Status: DC
  Administered 2022-03-19 – 2022-03-20 (×2): 25 mg via ORAL
  Filled 2022-03-19 (×2): qty 1

## 2022-03-19 MED ORDER — POTASSIUM CHLORIDE CRYS ER 20 MEQ PO TBCR
20.0000 meq | EXTENDED_RELEASE_TABLET | Freq: Once | ORAL | Status: AC
  Administered 2022-03-19: 20 meq via ORAL
  Filled 2022-03-19: qty 1

## 2022-03-19 MED ORDER — LOSARTAN POTASSIUM 25 MG PO TABS
25.0000 mg | ORAL_TABLET | Freq: Two times a day (BID) | ORAL | Status: DC
  Administered 2022-03-19 – 2022-03-20 (×3): 25 mg via ORAL
  Filled 2022-03-19 (×3): qty 1

## 2022-03-19 NOTE — Progress Notes (Signed)
Patient ID: Heather [name], female   DOB: Mar 16, 1940, 82 y.o.   MRN: [id number]     Advanced Heart Failure Rounding Note  PCP-Cardiologist: Evalina Field, MD   Subjective:    No complaints this morning, feels good. Walked some yesterday.    Creatinine 1.53 => 1.65 => 1.49.  Co-ox 72%.  CVP 3-4 this morning.  She remains on milrinone 0.125 mcg/kg/min.   Cardiac MRI: 1. Severely dilated LV with diffuse hypokinesis and septal-lateral dyssynchrony, EF 16%. 2.  Moderately dilated RV with EF 23%. 3. Mid-apical inferolateral LGE in a pattern of prior infarction (subendocardial). This does not explain her cardiomyopathy. 4. 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 This is not in the range that would suggest amyloidosis.   Objective:   Weight Range: 67.9 kg Body mass index is 26.52 kg/m.   Vital Signs:   Temp:  [97.8 F (36.6 C)-98.8 F (37.1 C)] 98.3 F (36.8 C) (01/14 0806) Pulse Rate:  [95-107] 103 (01/14 0806) Resp:  [12-20] 12 (01/14 0806) BP: (104-119)/(68-78) 117/78 (01/14 0806) SpO2:  [93 %-99 %] 93 % (01/14 0806) Weight:  [67.9 kg] 67.9 kg (01/14 0346) Last BM Date : 03/18/22  Weight change: Filed Weights   03/17/22 0511 03/18/22 0312 03/19/22 0346  Weight: 69.8 kg 69.6 kg 67.9 kg    Intake/Output:   Intake/Output Summary (Last 24 hours) at 03/19/2022 0856 Last data filed at 03/19/2022 0346 Gross per 24 hour  Intake 1009.84 ml  Output 650 ml  Net 359.84 ml      Physical Exam    General: NAD Neck: No JVD, no thyromegaly or thyroid nodule.  Lungs: Clear to auscultation bilaterally with normal respiratory effort. CV: Lateral PMI.  Heart regular S1/S2, no S3/S4, no murmur.  No peripheral edema.   Abdomen: Soft, nontender, no hepatosplenomegaly, no distention.  Skin: Intact without lesions or rashes.  Neurologic: Alert and oriented x 3.  Psych: Normal affect. Extremities: No clubbing or cyanosis.  HEENT: Normal.   Telemetry    NSR in 100s with occasional PVCs (personally reviewed)  Labs    CBC Recent Labs    03/18/22 0545 03/19/22 0352  WBC 4.8 5.2  HGB 12.1 12.0  HCT 36.7 37.3  MCV 95.3 95.6  PLT 176 376   Basic Metabolic Panel Recent Labs    03/18/22 0545 03/19/22 0352  NA 142 141  K 3.6 3.8  CL 99 100  CO2 32 32  GLUCOSE 111* 105*  BUN 28* 25*  CREATININE 1.65* 1.49*  CALCIUM 8.5* 8.7*  MG 1.7 2.1   Liver Function Tests Recent Labs    03/18/22 0545 03/19/22 0352  AST 30 31  ALT 25 22  ALKPHOS 61 57  BILITOT 1.0 0.8  PROT 5.5* 5.4*  ALBUMIN 2.8* 2.7*   No results for input(s): "LIPASE", "AMYLASE" in the last 72 hours. Cardiac Enzymes No results for input(s): "CKTOTAL", "CKMB", "CKMBINDEX", "TROPONINI" in the last 72 hours.  BNP: BNP (last 3 results) Recent Labs    10/07/21 1242 03/13/22 1314 03/15/22 0900  BNP 1,731.0* >4,500.0* >4,500.0*    ProBNP (last 3 results) No results for input(s): "PROBNP" in the last 8760 hours.   D-Dimer No results for input(s): "DDIMER" in the last 72 hours. Hemoglobin A1C No results for input(s): "HGBA1C" in the last 72 hours. Fasting Lipid Panel No results for input(s): "CHOL", "HDL", "LDLCALC", "TRIG", "CHOLHDL", "LDLDIRECT" in the last 72 hours. Thyroid Function Tests No results for input(s): "TSH", "T4TOTAL", "T3FREE", "THYROIDAB"  in the last 72 hours.  Invalid input(s): "FREET3"  Other results:   Imaging    No results found.   Medications:     Scheduled Medications:  allopurinol  150 mg Oral Daily   aspirin  81 mg Oral Daily   atorvastatin  10 mg Oral Daily   Chlorhexidine Gluconate Cloth  6 each Topical Daily   dapagliflozin propanediol  10 mg Oral Daily   digoxin  0.125 mg Oral Daily   famotidine  20 mg Oral QHS   fluticasone furoate-vilanterol  1 puff Inhalation Daily   And   umeclidinium bromide  1 puff Inhalation Daily   heparin  5,000 Units Subcutaneous Q8H   losartan  25 mg Oral BID   sodium  chloride flush  10-40 mL Intracatheter Q12H   spironolactone  25 mg Oral Daily    Infusions:    PRN Medications: acetaminophen **OR** acetaminophen, albuterol, alum & mag hydroxide-simeth, HYDROcodone-acetaminophen, phenol, polyethylene glycol, sodium chloride flush, zolpidem    Assessment/Plan   1. 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 Cath in 8/23 with no significant CAD, CI 2.24. ?LBBB CMP versus familial versus prior myocarditis.  Last echo in 12/23 with EF < 20%, moderately reduced RV function, D-shaped septum, mild-moderate MR. She was admitted to [facility] with volume overload, empirically started on milrinone 0.25 and also started on Lasix gtt 20 mg/hr.  Cardiac MRI here showed LV severely dilated with EF 16% and septal-lateral dyssynchrony, RV moderately dilated with EF 23%, mid-apical inferolateral LGE in prior infarction pattern.  However, CAD does not explain her cardiomyopathy based on recent cath.  She diuresed well, CVP down to 3-4 today. Co-ox 72% on milrinone 0.125. She has significant peripheral edema still but suspect venous insufficiency plays a large role here. Creatinine down to 1.49. - Hold diuretic again today, consider Lasix 20 mg daily or qod starting tomorrow. .  - Increase spironolactone to 25 mg daily.  - Continue digoxin 0.125 daily.  - Continue Farxiga 10 mg daily.  - Stop milrinone.  - Increase losartan to 25 mg bid.  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  Will not use Entresto.  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  - Unna boots for peripheral edema.  2. CKD stage 3: Creatinine trending down, holding diuretic with low CVP.  3. H/o right cerebellar CVA: On ASA 81 and statin.  4. H/o COPD: Prior smoker.   Mobilize, hopefully home tomorrow if stable off milrinone.  Followup CHF clinic and EP.    Length of Stay: 6  Loralie Champagne, MD  03/19/2022, 8:56 AM  Advanced Heart Failure Team Pager [phone] (M-F; 7a - 5p)  Please contact [facility] for night-coverage after hours (5p -7a ) and weekends on [email or web address]

## 2022-03-19 NOTE — Evaluation (Signed)
Physical Therapy Evaluation Patient Details Name: Heather [name] MRN: [id number] DOB: 06/23/1940 Today's Date: 03/19/2022  History of Present Illness  Pt is a 82 y.o. F who presents 03/13/2022 with complaints of bilateral lower extremity swelling. In the ED workup reveals BNP greater than 4500.  Chest x-ray shows bilateral pleural effusions, left lung base opacity and central vascular congestion consistent with CHF exacerbation. Significant PMH:  systolic CHF, hypertension,  Clinical Impression  PTA, pt lives alone in a level entry apartment and is a limited community ambulator. Pt presents with decreased cardiopulmonary endurance and mild balance deficits. Pt ambulating ~80 ft with no assistive device at a supervision level. Demonstrates shuffling gait pattern. HR 101-122 bpm, SpO2 90-95% on RA. Pt would benefit from follow up HHPT to address deficits and maximize functional mobility.     Recommendations for follow up therapy are one component of a multi-disciplinary discharge planning process, led by the attending physician.  Recommendations may be updated based on patient status, additional functional criteria and insurance authorization.  Follow Up Recommendations Home health PT      Assistance Recommended at Discharge PRN  Patient can return home with the following  Assistance with cooking/housework;Assist for transportation;Help with stairs or ramp for entrance    Equipment Recommendations Rollator (4 wheels)  Recommendations for Other Services       Functional Status Assessment Patient has had a recent decline in their functional status and demonstrates the ability to make significant improvements in function in a reasonable and predictable amount of time.     Precautions / Restrictions Precautions Precautions: Fall Restrictions Weight Bearing Restrictions: No      Mobility  Bed Mobility Overal bed mobility: Modified Independent                  Transfers Overall  transfer level: Independent Equipment used: None                    Ambulation/Gait Ambulation/Gait assistance: Supervision Gait Distance (Feet): 80 Feet Assistive device: None Gait Pattern/deviations: Step-through pattern, Decreased stride length, Shuffle Gait velocity: decreased     General Gait Details: Shuffling gait pattern, supervision for safety. Pt able to self monitor for activity tolerance  Stairs            Wheelchair Mobility    Modified Rankin (Stroke Patients Only)       Balance Overall balance assessment: Mild deficits observed, not formally tested                                           Pertinent Vitals/Pain Pain Assessment Pain Assessment: No/denies pain    Home Living Family/patient expects to be discharged to:: Private residence Living Arrangements: Alone Available Help at Discharge: Family;Friend(s) (sons) Type of Home: Apartment (1st floor, handicapped accessible) Home Access: Level entry       Home Layout: One level Home Equipment: Shower seat;BSC/3in1;Cane - single point;Rolling Environmental consultant (2 wheels)      Prior Function Prior Level of Function : Independent/Modified Independent             Mobility Comments: limited household ambulator       Hand Dominance        Extremity/Trunk Assessment   Upper Extremity Assessment Upper Extremity Assessment: Overall WFL for tasks assessed    Lower Extremity Assessment Lower Extremity Assessment: Overall WFL for tasks assessed  Communication   Communication: No difficulties  Cognition Arousal/Alertness: Awake/alert Behavior During Therapy: WFL for tasks assessed/performed Overall Cognitive Status: Within Functional Limits for tasks assessed                                          General Comments      Exercises General Exercises - Lower Extremity Long Arc Quad: Both, 5 reps, Seated   Assessment/Plan    PT Assessment  Patient needs continued PT services  PT Problem List Decreased strength;Decreased activity tolerance;Decreased balance;Decreased mobility;Cardiopulmonary status limiting activity       PT Treatment Interventions DME instruction;Gait training;Functional mobility training;Therapeutic activities;Therapeutic exercise;Balance training;Patient/family education    PT Goals (Current goals can be found in the Care Plan section)  Acute Rehab PT Goals Patient Stated Goal: to be able to walk [employer] distance PT Goal Formulation: With patient Time For Goal Achievement: 04/02/22 Potential to Achieve Goals: Good    Frequency Min 3X/week     Co-evaluation               AM-PAC PT "6 Clicks" Mobility  Outcome Measure Help needed turning from your back to your side while in a flat bed without using bedrails?: None Help needed moving from lying on your back to sitting on the side of a flat bed without using bedrails?: None Help needed moving to and from a bed to a chair (including a wheelchair)?: A Little Help needed standing up from a chair using your arms (e.g., wheelchair or bedside chair)?: None Help needed to walk in hospital room?: A Little Help needed climbing 3-5 steps with a railing? : A Little 6 Click Score: 21    End of Session   Activity Tolerance: Patient tolerated treatment well Patient left: in chair;with call bell/phone within reach Nurse Communication: Mobility status PT Visit Diagnosis: Unsteadiness on feet (R26.81);Difficulty in walking, not elsewhere classified (R26.2)    Time: 4098-1191 PT Time Calculation (min) (ACUTE ONLY): 31 min   Charges:   PT Evaluation $PT Eval Moderate Complexity: 1 Mod PT Treatments $Therapeutic Activity: 8-22 mins        Wyona Almas, PT, DPT Acute Rehabilitation Services Office [phone]   Deno Etienne 03/19/2022, 9:36 AM

## 2022-03-19 NOTE — Progress Notes (Addendum)
Mobility Specialist Progress Note    03/19/22 1501  Mobility  Activity Ambulated with assistance in hallway  Level of Assistance Standby assist, set-up cues, supervision of patient - no hands on  Assistive Device Front wheel walker  Distance Ambulated (ft) 150 ft  Activity Response Tolerated well  Mobility Referral Yes  $Mobility charge 1 Mobility   During Mobility: 116 HR Post-Mobility: 108 HR, 96% SpO2  Pt received in chair and agreeable. No complaints on walk. Tolerated on RA. Returned to bed with call bell in reach. RN notified.   Hildred Alamin Mobility Specialist  Please Psychologist, sport and exercise or Rehab Office at [phone]

## 2022-03-20 ENCOUNTER — Other Ambulatory Visit (HOSPITAL_COMMUNITY): Payer: Self-pay

## 2022-03-20 LAB — CBC
HCT: 39.4 % (ref 36.0–46.0)
Hemoglobin: 12.9 g/dL (ref 12.0–15.0)
MCH: 31.7 pg (ref 26.0–34.0)
MCHC: 32.7 g/dL (ref 30.0–36.0)
MCV: 96.8 fL (ref 80.0–100.0)
Platelets: 170 10*3/uL (ref 150–400)
RBC: 4.07 MIL/uL (ref 3.87–5.11)
RDW: 15.4 % (ref 11.5–15.5)
WBC: 5 10*3/uL (ref 4.0–10.5)
nRBC: 0 % (ref 0.0–0.2)

## 2022-03-20 LAB — COOXEMETRY PANEL
Carboxyhemoglobin: 1.8 % — ABNORMAL HIGH (ref 0.5–1.5)
Methemoglobin: <0.7 % (ref 0.0–1.5)
O2 Saturation: 59.1 %
Total hemoglobin: 13.1 g/dL (ref 12.0–16.0)

## 2022-03-20 LAB — COMPREHENSIVE METABOLIC PANEL
ALT: 26 U/L (ref 0–44)
AST: 39 U/L (ref 15–41)
Albumin: 2.5 g/dL — ABNORMAL LOW (ref 3.5–5.0)
Alkaline Phosphatase: 55 U/L (ref 38–126)
Anion gap: 7 (ref 5–15)
BUN: 21 mg/dL (ref 8–23)
CO2: 30 mmol/L (ref 22–32)
Calcium: 8.4 mg/dL — ABNORMAL LOW (ref 8.9–10.3)
Chloride: 103 mmol/L (ref 98–111)
Creatinine, Ser: 1.33 mg/dL — ABNORMAL HIGH (ref 0.44–1.00)
GFR, Estimated: 40 mL/min — ABNORMAL LOW (ref >60)
Glucose, Bld: 117 mg/dL — ABNORMAL HIGH (ref 70–99)
Potassium: 4 mmol/L (ref 3.5–5.1)
Sodium: 140 mmol/L (ref 135–145)
Total Bilirubin: 0.5 mg/dL (ref 0.3–1.2)
Total Protein: 5.3 g/dL — ABNORMAL LOW (ref 6.5–8.1)

## 2022-03-20 LAB — DIGOXIN LEVEL: Digoxin Level: 0.3 ng/mL — ABNORMAL LOW (ref 0.8–2.0)

## 2022-03-20 LAB — MAGNESIUM: Magnesium: 2 mg/dL (ref 1.7–2.4)

## 2022-03-20 MED ORDER — FUROSEMIDE 20 MG PO TABS: 20.0000 mg | ORAL_TABLET | Freq: Every day | ORAL | Status: DC

## 2022-03-20 MED ORDER — DIGOXIN 125 MCG PO TABS
0.1250 mg | ORAL_TABLET | Freq: Every day | ORAL | 5 refills | Status: DC
  Filled 2022-03-20: qty 30, 30d supply, fill #0

## 2022-03-20 MED ORDER — POTASSIUM CHLORIDE CRYS ER 20 MEQ PO TBCR
20.0000 meq | EXTENDED_RELEASE_TABLET | Freq: Once | ORAL | Status: AC
  Administered 2022-03-20: 20 meq via ORAL
  Filled 2022-03-20: qty 1

## 2022-03-20 MED ORDER — SPIRONOLACTONE 25 MG PO TABS
25.0000 mg | ORAL_TABLET | Freq: Every day | ORAL | 5 refills | Status: DC
  Filled 2022-03-20: qty 30, 30d supply, fill #0
  Filled 2022-04-17: qty 30, 30d supply, fill #1

## 2022-03-20 MED ORDER — FUROSEMIDE 40 MG PO TABS
40.0000 mg | ORAL_TABLET | Freq: Every day | ORAL | 5 refills | Status: DC
  Filled 2022-03-20: qty 30, 30d supply, fill #0
  Filled 2022-04-17: qty 30, 30d supply, fill #1

## 2022-03-20 MED ORDER — LOSARTAN POTASSIUM 25 MG PO TABS
25.0000 mg | ORAL_TABLET | Freq: Two times a day (BID) | ORAL | 5 refills | Status: DC
  Filled 2022-03-20: qty 60, 30d supply, fill #0
  Filled 2022-04-17: qty 60, 30d supply, fill #1

## 2022-03-20 MED ORDER — FUROSEMIDE 10 MG/ML IJ SOLN
80.0000 mg | Freq: Once | INTRAMUSCULAR | Status: AC
  Administered 2022-03-20: 80 mg via INTRAVENOUS
  Filled 2022-03-20: qty 8

## 2022-03-20 MED ORDER — ASPIRIN 81 MG PO CHEW
81.0000 mg | CHEWABLE_TABLET | Freq: Every day | ORAL | 5 refills | Status: DC
  Filled 2022-03-20: qty 30, 30d supply, fill #0
  Filled 2022-04-17: qty 30, 30d supply, fill #1

## 2022-03-20 MED ORDER — FUROSEMIDE 40 MG PO TABS: 40.0000 mg | ORAL_TABLET | Freq: Every day | ORAL | Status: DC

## 2022-03-20 NOTE — Plan of Care (Signed)

## 2022-03-20 NOTE — Progress Notes (Signed)
Arrived to this patient's room. Explained procedure to patient. HOB less than 45*. Pt held breath upon line removal. Pressure held for 5 min, no s/sx of bleeding noted, pressure drsg applied and instructed pt to remain in bed for 24min and to keep drsg CDI for 24 hours. Monitoring for any s/sx of bleeding. Patient VU. Fran Lowes, RN VAST

## 2022-03-20 NOTE — Progress Notes (Signed)
Secure chat sent to nurse regarding DC of PICC line. She reports patient is currently sitting up in her chair at bedside eating lunch. Instructed nurse to place VAST consult when patient is back in bed and ready for PICC line to be pulled. VU. Fran Lowes, RN VAST

## 2022-03-20 NOTE — Progress Notes (Addendum)
Patient ID: Heather [name], female   DOB: Dec 21, 1940, 82 y.o.   MRN: [id number]     Advanced Heart Failure Rounding Note  PCP-Cardiologist: Reatha Harps, MD   Subjective:     Creatinine 1.53 => 1.65 => 1.49=>1.33.  Co-ox 59%, milrinone stopped yesterday. CVP 6  Feels good this morning. Helped get out of restroom. Denies CP/SOB  Cardiac MRI: 1. Severely dilated LV with diffuse hypokinesis and septal-lateral dyssynchrony, EF 16%. 2.  Moderately dilated RV with EF 23%. 3. Mid-apical inferolateral LGE in a pattern of prior infarction (subendocardial). This does not explain her cardiomyopathy. 4. 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 This is not in the range that would suggest amyloidosis.   Objective:   Weight Range: 68.4 kg Body mass index is 26.71 kg/m.   Vital Signs:   Temp:  [97.7 F (36.5 C)-98.4 F (36.9 C)] 98 F (36.7 C) (01/15 0319) Pulse Rate:  [97-108] 106 (01/15 0319) Resp:  [12-19] 17 (01/15 0319) BP: (93-123)/(59-93) 123/83 (01/15 0319) SpO2:  [92 %-95 %] 93 % (01/15 0319) Weight:  [68.4 kg] 68.4 kg (01/15 0319) Last BM Date : 03/19/22  Weight change: Filed Weights   03/18/22 0312 03/19/22 0346 03/20/22 0319  Weight: 69.6 kg 67.9 kg 68.4 kg    Intake/Output:   Intake/Output Summary (Last 24 hours) at 03/20/2022 0733 Last data filed at 03/20/2022 0548 Gross per 24 hour  Intake 940 ml  Output 900 ml  Net 40 ml      Physical Exam   CVP 6 General:  elderly appearing.  No respiratory difficulty HEENT: normal Neck: supple. JVD ~6 cm. Carotids 2+ bilat; no bruits. No lymphadenopathy or thyromegaly appreciated. Cor: PMI nondisplaced. Tachy rate & reg rhythm. No rubs, gallops or murmurs. Lungs: clear Abdomen: soft, nontender, nondistended. No hepatosplenomegaly. No bruits or masses. Good bowel sounds. Extremities: no cyanosis, clubbing, rash, non-pitting BLE edema. + UNNA boots Neuro: alert & oriented x 3, cranial nerves  grossly intact. moves all 4 extremities w/o difficulty. Affect pleasant.   Telemetry   ST low 100s with occasional PVCs (personally reviewed)  EKG: NSR, LBBB 98 bpm  Labs    CBC Recent Labs    03/19/22 0352 03/20/22 0328  WBC 5.2 5.0  HGB 12.0 12.9  HCT 37.3 39.4  MCV 95.6 96.8  PLT 170 170   Basic Metabolic Panel Recent Labs    25/36/64 0352 03/20/22 0328  NA 141 140  K 3.8 4.0  CL 100 103  CO2 32 30  GLUCOSE 105* 117*  BUN 25* 21  CREATININE 1.49* 1.33*  CALCIUM 8.7* 8.4*  MG 2.1 2.0   Liver Function Tests Recent Labs    03/19/22 0352 03/20/22 0328  AST 31 39  ALT 22 26  ALKPHOS 57 55  BILITOT 0.8 0.5  PROT 5.4* 5.3*  ALBUMIN 2.7* 2.5*   No results for input(s): "LIPASE", "AMYLASE" in the last 72 hours. Cardiac Enzymes No results for input(s): "CKTOTAL", "CKMB", "CKMBINDEX", "TROPONINI" in the last 72 hours.  BNP: BNP (last 3 results) Recent Labs    10/07/21 1242 03/13/22 1314 03/15/22 0900  BNP 1,731.0* >4,500.0* >4,500.0*    ProBNP (last 3 results) No results for input(s): "PROBNP" in the last 8760 hours.   D-Dimer No results for input(s): "DDIMER" in the last 72 hours. Hemoglobin A1C No results for input(s): "HGBA1C" in the last 72 hours. Fasting Lipid Panel No results for input(s): "CHOL", "HDL", "LDLCALC", "TRIG", "CHOLHDL", "LDLDIRECT" in the last 72  hours. Thyroid Function Tests No results for input(s): "TSH", "T4TOTAL", "T3FREE", "THYROIDAB" in the last 72 hours.  Invalid input(s): "FREET3"  Other results:   Imaging    No results found.   Medications:     Scheduled Medications:  allopurinol  150 mg Oral Daily   aspirin  81 mg Oral Daily   atorvastatin  10 mg Oral Daily   Chlorhexidine Gluconate Cloth  6 each Topical Daily   dapagliflozin propanediol  10 mg Oral Daily   digoxin  0.125 mg Oral Daily   famotidine  20 mg Oral QHS   fluticasone furoate-vilanterol  1 puff Inhalation Daily   And   umeclidinium  bromide  1 puff Inhalation Daily   heparin  5,000 Units Subcutaneous Q8H   losartan  25 mg Oral BID   sodium chloride flush  10-40 mL Intracatheter Q12H   spironolactone  25 mg Oral Daily    Infusions:    PRN Medications: acetaminophen **OR** acetaminophen, albuterol, alum & mag hydroxide-simeth, HYDROcodone-acetaminophen, phenol, polyethylene glycol, sodium chloride flush, zolpidem    Assessment/Plan   1. 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 Cath in 8/23 with no significant CAD, CI 2.24. ?LBBB CMP versus familial versus prior myocarditis.  Last echo in 12/23 with EF < 20%, moderately reduced RV function, D-shaped septum, mild-moderate MR. She was admitted to [facility] with volume overload, empirically started on milrinone 0.25 and also started on Lasix gtt 20 mg/hr.  Cardiac MRI here showed LV severely dilated with EF 16% and septal-lateral dyssynchrony, RV moderately dilated with EF 23%, mid-apical inferolateral LGE in prior infarction pattern.  However, CAD does not explain her cardiomyopathy based on recent cath.  She diuresed well, CVP 6 today. Co-ox 59% now of milrinone. She has significant peripheral edema still but suspect venous insufficiency plays a large role here. Creatinine down to 1.33. - Will start lasix 20mg  PO daily - Continue spironolactone 25 mg daily.  - Continue digoxin 0.125 daily. Will go ahead and check dig level today prior to possible d/c.  - Continue Farxiga 10 mg daily.  - Milrinone stopped yesterday.  - Continue losartan 25 mg bid.  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  Will not use Entresto.  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  - Unna boots for peripheral edema.  2. CKD stage 3: Creatinine trending down 3. H/o right cerebellar CVA: On ASA 81 and statin.  4. H/o COPD: Prior smoker.   Appears  stable to d/c home. HH arranged with [facility]. Will make post-hospital f/u appt.   AHF meds at discharge: ASA 81mg  daily Atorvastatin 10 mg daily Farxiga 10 mg daily Digoxin .125 mg daily Lasix 40 mg daily Losartan 25 mg BID Spironolactone 25 mg daily   Length of Stay: [place], NP  03/20/2022, 7:33 AM  Advanced Heart Failure Team Pager [phone] (M-F; 7a - 5p)  Please contact [facility] for night-coverage after hours (5p -7a ) and weekends on [email or web address]    Patient seen and examined with the above-signed Advanced Practice Provider and/or Housestaff. I personally reviewed laboratory data, imaging studies and relevant notes. I independently examined the patient and formulated the important aspects of the plan. I have edited the note to reflect any of my changes or salient points. I have personally discussed the plan with the patient  and/or family.    Doing well. Co-ox stable off milrinone. CVP 12. No CP or SOB.   General:  Elderly No resp difficulty HEENT: normal Neck: supple.JVP 12  Carotids 2+ bilat; no bruits. No lymphadenopathy or thryomegaly appreciated. Cor: PMI nondisplaced. Regular mildly tachy. No rubs, gallops or murmurs. Lungs: clear Abdomen: soft, nontender, nondistended. No hepatosplenomegaly. No bruits or masses. Good bowel sounds. Extremities: no cyanosis, clubbing, rash, edema Neuro: alert & orientedx3, cranial nerves grossly intact. moves all 4 extremities w/o difficulty. Affect pleasant  Much improved but still tenuous with sinus tach.   Will give one dose 80 IV lasix today. Agree with d/c today from HF perspective on above meds. Will arrange close outpatient HF f/u.   Glori Bickers, MD  9:38 AM

## 2022-03-20 NOTE — TOC Transition Note (Addendum)
Transition of Care [facility]) - CM/SW Discharge Note   Patient Details  Name: Heather [name] MRN: [id number] Date of Birth: 1940-07-13  [facility] of [facility]) CM/SW Contact:  Zenon Mayo, RN Phone Number: 03/20/2022, 1:25 PM   Clinical Narrative:    Patient is for dc today, she was set up by previous NCM for [facility] [place] with [facility]. This NCM notified Corene Cornea with [facility] of the dc today.     Barriers to Discharge: Continued Medical Work up   Patient Goals and CMS Choice   Choice offered to / list presented to : Patient  Discharge Placement                         Discharge Plan and Services Additional resources added to the After Visit Summary for   In-house Referral: Clinical Social Work   Post Acute Care Choice: Home Health                    HH Arranged: RN [facility] Agency: [place] ([place]) Date [place]: 03/14/22 Time [address]: [phone] Representative spoke with at [place]: [place] Determinants of Health ([place]) Interventions [place]: No Food Insecurity (03/13/2022)  Housing: Low Risk  (03/13/2022)  Transportation Needs: Unmet Transportation Needs (03/15/2022)  Utilities: Not At Risk (03/13/2022)  Alcohol Screen: Low Risk  (11/08/2021)  Depression (PHQ2-9): Low Risk  (02/02/2022)  Financial Resource Strain: High Risk (11/08/2021)  Physical Activity: Sufficiently Active (11/08/2021)  Social Connections: Moderately Isolated (11/08/2021)  Stress: No Stress Concern Present (11/08/2021)  Tobacco Use: Medium Risk (03/13/2022)     Readmission Risk Interventions     No data to display

## 2022-03-20 NOTE — Progress Notes (Signed)
Mobility Specialist Progress Note   03/20/22 1045  Mobility  Activity Ambulated with assistance in hallway;Transferred from bed to chair  Level of Assistance Standby assist, set-up cues, supervision of patient - no hands on  Assistive Device Front wheel walker  Distance Ambulated (ft) 150 ft  Range of Motion/Exercises Active;All extremities  Activity Response Tolerated well   Pre Ambulation:  HR 105, SpO2 96% RA During Ambulation: HR 126, SpO2 >90% RA Post Ambulation: HR 118, SpO2 95% RA  Patient received in supine and eager to participate. Ambulated supervision level with steady gait. Returned to room without complaint or incident. Was left in recliner with all needs met, call bell in reach.   Heather [name], BS EXP Mobility Specialist Please contact via SecureChat or Rehab office at [phone]

## 2022-03-20 NOTE — Progress Notes (Signed)
Patient provided with verbal discharge instructions. Paper copy of discharge provided to patient. Son at beside during d/c. RN answered all questions. VSS at discharge. IV removed. PICC removed patient in bed for 30 mins post PICC removal.  Patient belongings sent with patient. TOC medications brought to beside.  Patient dc'd via wheelchair to private vehicle at [employer].

## 2022-03-20 NOTE — Progress Notes (Signed)
CARDIAC REHAB PHASE I   Pt sitting in chair, eating lunch. Feeling well today and preparing for discharge home. HF education including HF booklet, low sodium heart healthy diet, exercise guidelines,risk factors and CRP2 reviewed. All questions and concerns addressed. Will refer to AP fr CRP2.   1100-1200  Vanessa Barbara, RN BSN 03/20/2022 12:17 PM

## 2022-03-20 NOTE — Discharge Summary (Signed)
Advanced Heart Failure Team  Discharge Summary   Patient ID: Heather [name] MRN: [id number], DOB/AGE: 81/31/1942 81 y.o. 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 Underwent [facility] 10/2021 which showed mild nonobstructive CAD w/ only 25% mRCA stenosis. 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 Echo 12/23. LVEF still severely reduced < 15%, progression of RV dysfunction (mod reduced) and severely elevated PA pressure w/ estimated RVSP 68 mmHg.     Had progressive SOB and BLE swelling over the last 2 months, worsening during the 2 weeks prior to admission. She was admitted to [facility] with a/c CHF w/ volume overload. Was placed on a lasix gtt and transferred to [facility]. Placed on empiric milrinone to augment diuresis. Diuresed well during hospitalization. Lost about 20lbs. cMRI showed EF 16%, mod dilated RV w/ RVEF at 23%. Weaned off milrinone at discharge.   Pt will continue to be followed closely in the HF clinic. Dr Haroldine Laws evaluated and deemed appropriate for discharge. F/u scheduled.   See below for detailed problem list:  1. 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 Cath in 8/23 with no significant CAD, CI 2.24. ?LBBB CMP versus familial versus prior myocarditis.  Last echo in 12/23 with EF < 20%, moderately reduced RV function, D-shaped septum, mild-moderate MR. She was admitted to [facility] with volume overload, empirically started on milrinone 0.25 and also started on Lasix gtt 20 mg/hr.  Cardiac MRI here showed LV severely dilated with EF 16% and septal-lateral dyssynchrony, RV moderately dilated with EF 23%, mid-apical  inferolateral LGE in prior infarction pattern.  However, CAD does not explain her cardiomyopathy based on recent cath.  She diuresed well, CVP 6 today. Co-ox 59% now of milrinone. She has significant peripheral edema still but suspect venous insufficiency plays a large role here. Creatinine down to 1.33 on discharge. - Start lasix 40mg  PO daily  - Continue spironolactone 25 mg daily.  - Continue digoxin 0.125 daily. Will go ahead and check dig level today prior to possible d/c.  - Continue Farxiga 10 mg daily.  - Milrinone stopped 1/14, co-ox stable.  - Continue losartan 25 mg bid.  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  Will not use Entresto.  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  - Unna boots for peripheral edema.  2. CKD stage 3: Creatinine trending down. 1.33 at discharge 3. H/o right cerebellar CVA: On ASA 81 and statin.  4. H/o COPD: Prior smoker.    Discharge Weight Range: 68.4kg Discharge Vitals: Blood pressure (!) 125/90, pulse (!) 116, temperature 97.6 F (36.4 C), temperature source Oral, resp. rate 16, height 5\' 3"  (1.6 m), weight 68.4 kg, SpO2 96 %.  Labs: Lab Results  Component Value Date   WBC 5.0 03/20/2022   HGB 12.9 03/20/2022   HCT 39.4 03/20/2022   MCV 96.8 03/20/2022   PLT 170 03/20/2022    Recent Labs  Lab 03/20/22 0328  NA 140  K 4.0  CL 103  CO2 30  BUN 21  CREATININE 1.33*  CALCIUM 8.4*  PROT 5.3*  BILITOT 0.5  ALKPHOS 55  ALT 26  AST 39  GLUCOSE 117*   Lab Results  Component Value Date   CHOL 196 12/14/2020   HDL 89 12/14/2020   LDLCALC 92 12/14/2020   TRIG 77 12/14/2020   BNP (last 3 results) Recent Labs    10/07/21 1242 03/13/22 1314 03/15/22 0900  BNP 1,731.0* >4,500.0* >4,500.0*    ProBNP (last 3 results) No results for input(s): "PROBNP" in the last 8760 hours.   Diagnostic  Studies/Procedures   Cardiac MRI: 1. Severely dilated LV with diffuse hypokinesis and septal-lateral dyssynchrony, EF 16%. 2.  Moderately dilated RV with EF 23%. 3. Mid-apical inferolateral LGE in a pattern of prior infarction (subendocardial). This does not explain her cardiomyopathy. 4. 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 This is not in the range that would suggest amyloidosis.  Discharge Medications   Allergies as of 03/20/2022       Reactions   Lovastatin Swelling   Patient states that her tongue swells and she gets a tingling feeling all over. Patient states that her tongue swells and she gets a tingling feeling all over.   Lisinopril Swelling   Tongue swelling        Medication List     STOP taking these medications    bisoprolol 5 MG tablet Commonly known as: ZEBETA   isosorbide mononitrate 30 MG 24 hr tablet Commonly known as: IMDUR       TAKE these medications    acetaminophen 500 MG tablet Commonly known as: TYLENOL Take 500 mg by mouth every 6 (six) hours as needed.   allopurinol 300 MG tablet Commonly known as: ZYLOPRIM Take 0.5 tablets (150 mg total) by mouth daily.   Aspirin Low Dose 81 MG chewable tablet Generic drug: aspirin Chew 1 tablet (81 mg total) by mouth daily. Start taking on: March 21, 2022   atorvastatin 10 MG tablet Commonly known as: LIPITOR Take 1 tablet (10 mg total) by mouth daily.   chlorpheniramine 4 MG tablet Commonly known as: CHLOR-TRIMETON Take 4 mg by mouth daily as needed for allergies.   dapagliflozin propanediol 10 MG Tabs tablet Commonly known as: FARXIGA Take 1 tablet (10 mg total) by mouth daily.   DIALYVITE VITAMIN D 5000 PO Take by mouth.   digoxin 0.125 MG tablet Commonly known as: LANOXIN Take 1 tablet (0.125 mg total) by mouth daily. Start taking on: March 21, 2022   famotidine 20 MG tablet Commonly known as: Pepcid Take 1 tablet (20 mg total) by mouth at  bedtime. One after supper   furosemide 40 MG tablet Commonly known as: LASIX Take 1 tablet (40 mg total) by mouth daily. Start taking on: March 21, 2022 What changed: how much to take   HYDROcodone-acetaminophen 5-325 MG tablet Commonly known as: NORCO/VICODIN Take 1 tablet by mouth every 6 (six) hours as needed for moderate pain.   ketoconazole 2 % cream Commonly known as: NIZORAL Apply 1 application topically daily.   losartan 25 MG tablet Commonly known as: COZAAR Take 1 tablet (25 mg total) by mouth 2 (two) times daily.   multivitamin with minerals tablet Take 1 tablet by mouth daily.   ProAir HFA 108 (90 Base) MCG/ACT inhaler Generic drug: albuterol Inhale 2 puffs into the lungs every 6 (six) hours as needed for wheezing or shortness of breath.   spironolactone 25 MG tablet Commonly known  as: ALDACTONE Take 1 tablet (25 mg total) by mouth daily. Start taking on: March 21, 2022   Trelegy Ellipta 100-62.5-25 MCG/ACT Aepb Generic drug: Fluticasone-Umeclidin-Vilant Inhale 1 puff into the lungs daily.   venlafaxine XR 37.5 MG 24 hr capsule Commonly known as: EFFEXOR-XR Take 37.5 mg by mouth See admin instructions. Take 2 capsules by mouth daily   zolpidem 10 MG tablet Commonly known as: AMBIEN TAKE (1) TABLET BY MOUTH AT BEDTIME AS NEEDED FOR SLEEP What changed: See the new instructions.               Durable Medical Equipment  (From admission, onward)           Start     Ordered   03/20/22 1258  Heart failure home health orders  (Heart failure home health orders / Face to face)  Once       Comments: Heart Failure Follow-up Care:  Verify follow-up appointments per Patient Discharge Instructions. Confirm transportation arranged. Reconcile home medications with discharge medication list. Remove discontinued medications from use. Assist patient/caregiver to manage medications using pill box. Reinforce low sodium food selection Assessments: Vital  signs and oxygen saturation at each visit. Assess home environment for safety concerns, caregiver support and availability of low-sodium foods. Consult Education officer, museum, PT/OT, Dietitian, and CNA based on assessments. Perform comprehensive cardiopulmonary assessment. Notify MD for any change in condition or weight gain of 3 pounds in one day or 5 pounds in one week with symptoms. Daily Weights and Symptom Monitoring: Ensure patient has access to scales. Teach patient/caregiver to weigh daily before breakfast and after voiding using same scale and record.    Teach patient/caregiver to track weight and symptoms and when to notify Provider. Activity: Develop individualized activity plan with patient/caregiver.   Question Answer Comment  Heart Failure Follow-up Care Advanced Heart Failure (AHF) Clinic at [phone]   Lab frequency Other see comments   Fax lab results to AHF Clinic at [phone]   Diet Low Sodium Heart Healthy   Fluid restrictions: 1800 mL Fluid      03/20/22 1259            Disposition   The patient will be discharged in stable condition to home. Discharge Instructions     Diet - low sodium heart healthy   Complete by: As directed    Increase activity slowly   Complete by: As directed        Follow-up Information     [facility] Follow up.   Why: Will contact you to schedule home health visits.        [facility] Follow up on 03/31/2022.   Specialty: Cardiology Why: Follow up in the [facility] at [facility] 03/31/22 at [place], free valet Please bring all medications with you Contact information: [address] [phone] [place] [phone]                  Duration of Discharge Encounter: Greater than 35 minutes   Signed, Earnie Larsson AGACNP-BC  03/20/2022, 4:02 PM

## 2022-03-21 DIAGNOSIS — J449 Chronic obstructive pulmonary disease, unspecified: Secondary | ICD-10-CM | POA: Diagnosis not present

## 2022-03-21 DIAGNOSIS — I5023 Acute on chronic systolic (congestive) heart failure: Secondary | ICD-10-CM | POA: Diagnosis not present

## 2022-03-21 DIAGNOSIS — G4733 Obstructive sleep apnea (adult) (pediatric): Secondary | ICD-10-CM | POA: Diagnosis not present

## 2022-03-21 DIAGNOSIS — M1030 Gout due to renal impairment, unspecified site: Secondary | ICD-10-CM | POA: Diagnosis not present

## 2022-03-21 DIAGNOSIS — I051 Rheumatic mitral insufficiency: Secondary | ICD-10-CM | POA: Diagnosis not present

## 2022-03-21 DIAGNOSIS — I130 Hypertensive heart and chronic kidney disease with heart failure and stage 1 through stage 4 chronic kidney disease, or unspecified chronic kidney disease: Secondary | ICD-10-CM | POA: Diagnosis not present

## 2022-03-21 DIAGNOSIS — I5082 Biventricular heart failure: Secondary | ICD-10-CM | POA: Diagnosis not present

## 2022-03-21 DIAGNOSIS — I447 Left bundle-branch block, unspecified: Secondary | ICD-10-CM | POA: Diagnosis not present

## 2022-03-21 DIAGNOSIS — N1832 Chronic kidney disease, stage 3b: Secondary | ICD-10-CM | POA: Diagnosis not present

## 2022-03-21 DIAGNOSIS — E876 Hypokalemia: Secondary | ICD-10-CM | POA: Diagnosis not present

## 2022-03-21 DIAGNOSIS — I428 Other cardiomyopathies: Secondary | ICD-10-CM | POA: Diagnosis not present

## 2022-03-21 DIAGNOSIS — I272 Pulmonary hypertension, unspecified: Secondary | ICD-10-CM | POA: Diagnosis not present

## 2022-03-21 DIAGNOSIS — I2510 Atherosclerotic heart disease of native coronary artery without angina pectoris: Secondary | ICD-10-CM | POA: Diagnosis not present

## 2022-03-21 DIAGNOSIS — F32A Depression, unspecified: Secondary | ICD-10-CM | POA: Diagnosis not present

## 2022-03-21 DIAGNOSIS — M1990 Unspecified osteoarthritis, unspecified site: Secondary | ICD-10-CM | POA: Diagnosis not present

## 2022-03-21 DIAGNOSIS — F419 Anxiety disorder, unspecified: Secondary | ICD-10-CM | POA: Diagnosis not present

## 2022-03-21 NOTE — Addendum Note (Signed)
Addended by: Eual Fines on: 03/21/2022 11:34 AM   Modules accepted: Level of Service

## 2022-03-22 ENCOUNTER — Telehealth: Payer: Self-pay | Admitting: Internal Medicine

## 2022-03-22 DIAGNOSIS — E876 Hypokalemia: Secondary | ICD-10-CM | POA: Diagnosis not present

## 2022-03-22 DIAGNOSIS — I428 Other cardiomyopathies: Secondary | ICD-10-CM | POA: Diagnosis not present

## 2022-03-22 DIAGNOSIS — G4733 Obstructive sleep apnea (adult) (pediatric): Secondary | ICD-10-CM | POA: Diagnosis not present

## 2022-03-22 DIAGNOSIS — I5023 Acute on chronic systolic (congestive) heart failure: Secondary | ICD-10-CM | POA: Diagnosis not present

## 2022-03-22 DIAGNOSIS — N1832 Chronic kidney disease, stage 3b: Secondary | ICD-10-CM | POA: Diagnosis not present

## 2022-03-22 DIAGNOSIS — J449 Chronic obstructive pulmonary disease, unspecified: Secondary | ICD-10-CM | POA: Diagnosis not present

## 2022-03-22 DIAGNOSIS — M1990 Unspecified osteoarthritis, unspecified site: Secondary | ICD-10-CM | POA: Diagnosis not present

## 2022-03-22 DIAGNOSIS — I2510 Atherosclerotic heart disease of native coronary artery without angina pectoris: Secondary | ICD-10-CM | POA: Diagnosis not present

## 2022-03-22 DIAGNOSIS — I130 Hypertensive heart and chronic kidney disease with heart failure and stage 1 through stage 4 chronic kidney disease, or unspecified chronic kidney disease: Secondary | ICD-10-CM | POA: Diagnosis not present

## 2022-03-22 DIAGNOSIS — I272 Pulmonary hypertension, unspecified: Secondary | ICD-10-CM | POA: Diagnosis not present

## 2022-03-22 DIAGNOSIS — I447 Left bundle-branch block, unspecified: Secondary | ICD-10-CM | POA: Diagnosis not present

## 2022-03-22 DIAGNOSIS — I5082 Biventricular heart failure: Secondary | ICD-10-CM | POA: Diagnosis not present

## 2022-03-22 DIAGNOSIS — I051 Rheumatic mitral insufficiency: Secondary | ICD-10-CM | POA: Diagnosis not present

## 2022-03-22 DIAGNOSIS — F419 Anxiety disorder, unspecified: Secondary | ICD-10-CM | POA: Diagnosis not present

## 2022-03-22 DIAGNOSIS — M1030 Gout due to renal impairment, unspecified site: Secondary | ICD-10-CM | POA: Diagnosis not present

## 2022-03-22 DIAGNOSIS — F32A Depression, unspecified: Secondary | ICD-10-CM | POA: Diagnosis not present

## 2022-03-22 NOTE — Telephone Encounter (Signed)
Verbal order given  

## 2022-03-22 NOTE — Telephone Encounter (Signed)
Heather [name], [phone]  Wants verbal orders for skilled nursing?  2 x a wk for 3 wks 1 x a wk for 6 wks 2 PRN  If verbal is left on vm, she needs nurse last name & title left as well

## 2022-03-24 ENCOUNTER — Telehealth: Payer: Self-pay | Admitting: Internal Medicine

## 2022-03-24 DIAGNOSIS — I130 Hypertensive heart and chronic kidney disease with heart failure and stage 1 through stage 4 chronic kidney disease, or unspecified chronic kidney disease: Secondary | ICD-10-CM | POA: Diagnosis not present

## 2022-03-24 DIAGNOSIS — M1990 Unspecified osteoarthritis, unspecified site: Secondary | ICD-10-CM | POA: Diagnosis not present

## 2022-03-24 DIAGNOSIS — G4733 Obstructive sleep apnea (adult) (pediatric): Secondary | ICD-10-CM | POA: Diagnosis not present

## 2022-03-24 DIAGNOSIS — I447 Left bundle-branch block, unspecified: Secondary | ICD-10-CM | POA: Diagnosis not present

## 2022-03-24 DIAGNOSIS — I5023 Acute on chronic systolic (congestive) heart failure: Secondary | ICD-10-CM | POA: Diagnosis not present

## 2022-03-24 DIAGNOSIS — I428 Other cardiomyopathies: Secondary | ICD-10-CM | POA: Diagnosis not present

## 2022-03-24 DIAGNOSIS — I5082 Biventricular heart failure: Secondary | ICD-10-CM | POA: Diagnosis not present

## 2022-03-24 DIAGNOSIS — F419 Anxiety disorder, unspecified: Secondary | ICD-10-CM | POA: Diagnosis not present

## 2022-03-24 DIAGNOSIS — N1832 Chronic kidney disease, stage 3b: Secondary | ICD-10-CM | POA: Diagnosis not present

## 2022-03-24 DIAGNOSIS — M1030 Gout due to renal impairment, unspecified site: Secondary | ICD-10-CM | POA: Diagnosis not present

## 2022-03-24 DIAGNOSIS — E876 Hypokalemia: Secondary | ICD-10-CM | POA: Diagnosis not present

## 2022-03-24 DIAGNOSIS — I272 Pulmonary hypertension, unspecified: Secondary | ICD-10-CM | POA: Diagnosis not present

## 2022-03-24 DIAGNOSIS — J449 Chronic obstructive pulmonary disease, unspecified: Secondary | ICD-10-CM | POA: Diagnosis not present

## 2022-03-24 DIAGNOSIS — I2510 Atherosclerotic heart disease of native coronary artery without angina pectoris: Secondary | ICD-10-CM | POA: Diagnosis not present

## 2022-03-24 DIAGNOSIS — I051 Rheumatic mitral insufficiency: Secondary | ICD-10-CM | POA: Diagnosis not present

## 2022-03-24 DIAGNOSIS — F32A Depression, unspecified: Secondary | ICD-10-CM | POA: Diagnosis not present

## 2022-03-24 NOTE — Telephone Encounter (Signed)
Gave verbal orders. 

## 2022-03-24 NOTE — Telephone Encounter (Signed)
Marcella Dubs from [facility] called needs verbal orders  PT to approve plan of care PT for 1 week 1 , 1 week 3 this is for occupational health evaluated as needed for lower extremity and upper extremity, balance,gait and to transfer safety at home.  Phillip call back # [phone]

## 2022-03-27 DIAGNOSIS — F32A Depression, unspecified: Secondary | ICD-10-CM | POA: Diagnosis not present

## 2022-03-27 DIAGNOSIS — E876 Hypokalemia: Secondary | ICD-10-CM | POA: Diagnosis not present

## 2022-03-27 DIAGNOSIS — I428 Other cardiomyopathies: Secondary | ICD-10-CM | POA: Diagnosis not present

## 2022-03-27 DIAGNOSIS — N1832 Chronic kidney disease, stage 3b: Secondary | ICD-10-CM | POA: Diagnosis not present

## 2022-03-27 DIAGNOSIS — J449 Chronic obstructive pulmonary disease, unspecified: Secondary | ICD-10-CM | POA: Diagnosis not present

## 2022-03-27 DIAGNOSIS — I447 Left bundle-branch block, unspecified: Secondary | ICD-10-CM | POA: Diagnosis not present

## 2022-03-27 DIAGNOSIS — I5082 Biventricular heart failure: Secondary | ICD-10-CM | POA: Diagnosis not present

## 2022-03-27 DIAGNOSIS — M1030 Gout due to renal impairment, unspecified site: Secondary | ICD-10-CM | POA: Diagnosis not present

## 2022-03-27 DIAGNOSIS — M1990 Unspecified osteoarthritis, unspecified site: Secondary | ICD-10-CM | POA: Diagnosis not present

## 2022-03-27 DIAGNOSIS — G4733 Obstructive sleep apnea (adult) (pediatric): Secondary | ICD-10-CM | POA: Diagnosis not present

## 2022-03-27 DIAGNOSIS — I2510 Atherosclerotic heart disease of native coronary artery without angina pectoris: Secondary | ICD-10-CM | POA: Diagnosis not present

## 2022-03-27 DIAGNOSIS — I5023 Acute on chronic systolic (congestive) heart failure: Secondary | ICD-10-CM | POA: Diagnosis not present

## 2022-03-27 DIAGNOSIS — I051 Rheumatic mitral insufficiency: Secondary | ICD-10-CM | POA: Diagnosis not present

## 2022-03-27 DIAGNOSIS — I272 Pulmonary hypertension, unspecified: Secondary | ICD-10-CM | POA: Diagnosis not present

## 2022-03-27 DIAGNOSIS — F419 Anxiety disorder, unspecified: Secondary | ICD-10-CM | POA: Diagnosis not present

## 2022-03-27 DIAGNOSIS — I130 Hypertensive heart and chronic kidney disease with heart failure and stage 1 through stage 4 chronic kidney disease, or unspecified chronic kidney disease: Secondary | ICD-10-CM | POA: Diagnosis not present

## 2022-03-29 DIAGNOSIS — M1990 Unspecified osteoarthritis, unspecified site: Secondary | ICD-10-CM | POA: Diagnosis not present

## 2022-03-29 DIAGNOSIS — M1030 Gout due to renal impairment, unspecified site: Secondary | ICD-10-CM | POA: Diagnosis not present

## 2022-03-29 DIAGNOSIS — I130 Hypertensive heart and chronic kidney disease with heart failure and stage 1 through stage 4 chronic kidney disease, or unspecified chronic kidney disease: Secondary | ICD-10-CM | POA: Diagnosis not present

## 2022-03-29 DIAGNOSIS — N1832 Chronic kidney disease, stage 3b: Secondary | ICD-10-CM | POA: Diagnosis not present

## 2022-03-29 DIAGNOSIS — E876 Hypokalemia: Secondary | ICD-10-CM | POA: Diagnosis not present

## 2022-03-29 DIAGNOSIS — I2510 Atherosclerotic heart disease of native coronary artery without angina pectoris: Secondary | ICD-10-CM | POA: Diagnosis not present

## 2022-03-29 DIAGNOSIS — I5082 Biventricular heart failure: Secondary | ICD-10-CM | POA: Diagnosis not present

## 2022-03-29 DIAGNOSIS — J449 Chronic obstructive pulmonary disease, unspecified: Secondary | ICD-10-CM | POA: Diagnosis not present

## 2022-03-29 DIAGNOSIS — I447 Left bundle-branch block, unspecified: Secondary | ICD-10-CM | POA: Diagnosis not present

## 2022-03-29 DIAGNOSIS — I428 Other cardiomyopathies: Secondary | ICD-10-CM | POA: Diagnosis not present

## 2022-03-29 DIAGNOSIS — I272 Pulmonary hypertension, unspecified: Secondary | ICD-10-CM | POA: Diagnosis not present

## 2022-03-29 DIAGNOSIS — I051 Rheumatic mitral insufficiency: Secondary | ICD-10-CM | POA: Diagnosis not present

## 2022-03-29 DIAGNOSIS — I5023 Acute on chronic systolic (congestive) heart failure: Secondary | ICD-10-CM | POA: Diagnosis not present

## 2022-03-29 DIAGNOSIS — F419 Anxiety disorder, unspecified: Secondary | ICD-10-CM | POA: Diagnosis not present

## 2022-03-29 DIAGNOSIS — G4733 Obstructive sleep apnea (adult) (pediatric): Secondary | ICD-10-CM | POA: Diagnosis not present

## 2022-03-29 DIAGNOSIS — F32A Depression, unspecified: Secondary | ICD-10-CM | POA: Diagnosis not present

## 2022-03-30 DIAGNOSIS — I051 Rheumatic mitral insufficiency: Secondary | ICD-10-CM | POA: Diagnosis not present

## 2022-03-30 DIAGNOSIS — I5023 Acute on chronic systolic (congestive) heart failure: Secondary | ICD-10-CM | POA: Diagnosis not present

## 2022-03-30 DIAGNOSIS — F419 Anxiety disorder, unspecified: Secondary | ICD-10-CM | POA: Diagnosis not present

## 2022-03-30 DIAGNOSIS — I447 Left bundle-branch block, unspecified: Secondary | ICD-10-CM | POA: Diagnosis not present

## 2022-03-30 DIAGNOSIS — G4733 Obstructive sleep apnea (adult) (pediatric): Secondary | ICD-10-CM | POA: Diagnosis not present

## 2022-03-30 DIAGNOSIS — I428 Other cardiomyopathies: Secondary | ICD-10-CM | POA: Diagnosis not present

## 2022-03-30 DIAGNOSIS — F32A Depression, unspecified: Secondary | ICD-10-CM | POA: Diagnosis not present

## 2022-03-30 DIAGNOSIS — N1832 Chronic kidney disease, stage 3b: Secondary | ICD-10-CM | POA: Diagnosis not present

## 2022-03-30 DIAGNOSIS — J449 Chronic obstructive pulmonary disease, unspecified: Secondary | ICD-10-CM | POA: Diagnosis not present

## 2022-03-30 DIAGNOSIS — I272 Pulmonary hypertension, unspecified: Secondary | ICD-10-CM | POA: Diagnosis not present

## 2022-03-30 DIAGNOSIS — E876 Hypokalemia: Secondary | ICD-10-CM | POA: Diagnosis not present

## 2022-03-30 DIAGNOSIS — M1990 Unspecified osteoarthritis, unspecified site: Secondary | ICD-10-CM | POA: Diagnosis not present

## 2022-03-30 DIAGNOSIS — I2510 Atherosclerotic heart disease of native coronary artery without angina pectoris: Secondary | ICD-10-CM | POA: Diagnosis not present

## 2022-03-30 DIAGNOSIS — M1030 Gout due to renal impairment, unspecified site: Secondary | ICD-10-CM | POA: Diagnosis not present

## 2022-03-30 DIAGNOSIS — I130 Hypertensive heart and chronic kidney disease with heart failure and stage 1 through stage 4 chronic kidney disease, or unspecified chronic kidney disease: Secondary | ICD-10-CM | POA: Diagnosis not present

## 2022-03-30 DIAGNOSIS — I5082 Biventricular heart failure: Secondary | ICD-10-CM | POA: Diagnosis not present

## 2022-03-30 NOTE — Progress Notes (Signed)
ADVANCED HF CLINIC CONSULT NOTE  Primary Care: Johnette Abraham, MD Primary Cardiologist: Dr. Audie Box HF Cardiologist: Dr. Aundra Dubin  HPI: 82 y.o. [name] w/ h/o CKD IIIb, LBBB, R cerebellar CVA and chronic biventricular heart failure, first diagnosed 10/2021. Echo then showed severely reduced LVEF 15-20%, RV mildly reduced, mild-mod MR. Echo also showed abnormal paradoxical septal motion c/w LBBB. Of note, prior echo in 2017 showed normal LVEF w/ septal dyssynchrony and normal RV.   She underwent [facility] 10/2021 which showed mild nonobstructive CAD w/ only 25% mRCA stenosis. 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 GDMT regimen included Farxiga, Imdur, Bisoprolol. Diuretic regimen Lasix 20 mg daily.    Echo 12/23 showed EF still severely reduced < 15%, progression of RV dysfunction (mod reduced) and severely elevated PA pressure w/ estimated RVSP 68 mmHg.     Admitted 1/24 w/ a/c CHF w/ volume overload. Started on lasix gtt, and placed on empiric milrinone today to argument diureses. GDMT held w/ soft BP. cMRI showed LVEF 16%, moderately dilated RV with RVEF 23%. Milrinone was weaned and GDMT titrated. She was discharged home with HH, weight 150 lbs.  Today she returns for HF follow up with her son. Overall feeling fine. She lives alone, she is not SOB walking on flat ground. Continues with some ankle swelling, wears compression. Denies palpitations, CP, dizziness, abnormal bleeding or PND/Orthopnea. Appetite ok. No fever or chills. Weight at home 142 pounds. Taking all medications. Stopped wearing CPAP, machine too loud. Has HH PT 3x/week. Was a heavy drinker > 20 years ago, no ETOH currently or tobacco/drug use.  ECG (personally reviewed): NSR LBBB, QRS 166 msec  Labs (1/24): K 4.0, creatinine 1.33, hgb 12.9   Cardiac Studies  - Cardiac MRI (1/24): LVEF 16%, RVEF 23%, mid-apical inferolateral LGE in prior infarction pattern.    - Echo (12/23): EF < 15%, moderately reduced RV function, severely elevated PA pressure (RVSP 68 mmHg)  - R/LHC (8/23): mild, non-obstructive CAD, mild to moderate pulmonary arterial hypertension RA mean 8, PA 54/18 (mean 34), PCWP 18, CO/CI (Fick) 3.87/2.24,   Review of Systems: [y] = yes, '[ ]'$  = no   General: Weight gain '[ ]'$ ; Weight loss '[ ]'$ ; Anorexia '[ ]'$ ; Fatigue '[ ]'$ ; Fever '[ ]'$ ; Chills '[ ]'$ ; Weakness '[ ]'$   Cardiac: Chest pain/pressure '[ ]'$ ; Resting SOB '[ ]'$ ; Exertional SOB '[ ]'$ ; Orthopnea '[ ]'$ ; Pedal Edema Blue.Reese ]; Palpitations '[ ]'$ ; Syncope '[ ]'$ ; Presyncope '[ ]'$ ; Paroxysmal nocturnal dyspnea'[ ]'$   Pulmonary: Cough '[ ]'$ ; Wheezing'[ ]'$ ; Hemoptysis'[ ]'$ ; Sputum '[ ]'$ ; Snoring Blue.Reese ]  GI: Vomiting'[ ]'$ ; Dysphagia'[ ]'$ ; Melena'[ ]'$ ; Hematochezia '[ ]'$ ; Heartburn'[ ]'$ ; Abdominal pain '[ ]'$ ; Constipation '[ ]'$ ; Diarrhea '[ ]'$ ; BRBPR '[ ]'$   GU: Hematuria'[ ]'$ ; Dysuria '[ ]'$ ; Nocturia'[ ]'$   Vascular: Pain in legs with walking '[ ]'$ ; Pain in feet with lying flat '[ ]'$ ; Non-healing sores '[ ]'$ ; Stroke Blue.Reese ]; TIA '[ ]'$ ; Slurred speech '[ ]'$ ;  Neuro: Headaches'[ ]'$ ; Vertigo'[ ]'$ ; Seizures'[ ]'$ ; Paresthesias'[ ]'$ ;Blurred vision '[ ]'$ ; Diplopia '[ ]'$ ; Vision changes '[ ]'$   Ortho/Skin: Arthritis '[ ]'$ ; Joint pain '[ ]'$ ; Muscle pain '[ ]'$ ; Joint swelling '[ ]'$ ; Back Pain '[ ]'$ ; Rash '[ ]'$   Psych: Depression'[ ]'$ ; Anxiety[y ]  Heme: Bleeding problems '[ ]'$ ; Clotting disorders '[ ]'$ ; Anemia '[ ]'$   Endocrine: Diabetes '[ ]'$ ; Thyroid dysfunction'[ ]'$   Past Medical History:  Diagnosis  Date   Anemia    Anxiety    Arthritis    COPD (chronic obstructive pulmonary disease) (HCC)    CVA (cerebral vascular accident) ([place])    Depression    Dry eyes 04/14/2014   GERD (gastroesophageal reflux disease)    Glaucoma    Gout    HTN (hypertension)    OSA (obstructive sleep apnea)    Current Outpatient Medications  Medication Sig Dispense Refill   allopurinol (ZYLOPRIM) 300 MG tablet Take 0.5 tablets (150 mg total) by mouth daily. 45 tablet 1   aspirin 81 MG chewable tablet Chew 1 tablet (81 mg total)  by mouth daily. 30 tablet 5   atorvastatin (LIPITOR) 10 MG tablet Take 1 tablet (10 mg total) by mouth daily. 30 tablet 2   Cholecalciferol (DIALYVITE VITAMIN D 5000 PO) Take by mouth.     dapagliflozin propanediol (FARXIGA) 10 MG TABS tablet Take 1 tablet (10 mg total) by mouth daily. 30 tablet 10   digoxin (LANOXIN) 0.125 MG tablet Take 1 tablet (0.125 mg total) by mouth daily. 30 tablet 5   famotidine (PEPCID) 20 MG tablet Take 1 tablet (20 mg total) by mouth at bedtime. One after supper 30 tablet 3   Fluticasone-Umeclidin-Vilant (TRELEGY ELLIPTA) 100-62.5-25 MCG/ACT AEPB Inhale 1 puff into the lungs daily. 60 each 11   furosemide (LASIX) 40 MG tablet Take 1 tablet (40 mg total) by mouth daily. 30 tablet 5   HYDROcodone-acetaminophen (NORCO/VICODIN) 5-325 MG tablet Take 1 tablet by mouth every 6 (six) hours as needed for moderate pain.     isosorbide mononitrate (IMDUR) 30 MG 24 hr tablet Take 30 mg by mouth daily.     ketoconazole (NIZORAL) 2 % cream Apply 1 application topically daily. 60 g 2   losartan (COZAAR) 25 MG tablet Take 1 tablet (25 mg total) by mouth 2 (two) times daily. 60 tablet 5   Multiple Vitamins-Minerals (MULTIVITAMIN WITH MINERALS) tablet Take 1 tablet by mouth daily.     PROAIR HFA 108 (90 Base) MCG/ACT inhaler Inhale 2 puffs into the lungs every 6 (six) hours as needed for wheezing or shortness of breath. 18 g 2   spironolactone (ALDACTONE) 25 MG tablet Take 1 tablet (25 mg total) by mouth daily. 30 tablet 5   venlafaxine XR (EFFEXOR-XR) 37.5 MG 24 hr capsule Take 37.5 mg by mouth See admin instructions. Take 2 capsules by mouth daily     zolpidem (AMBIEN) 10 MG tablet TAKE (1) TABLET BY MOUTH AT BEDTIME AS NEEDED FOR SLEEP (Patient taking differently: Take 10 mg by mouth at bedtime as needed for sleep.) 30 tablet 5   acetaminophen (TYLENOL) 500 MG tablet Take 500 mg by mouth every 6 (six) hours as needed.     chlorpheniramine (CHLOR-TRIMETON) 4 MG tablet Take 4 mg by  mouth daily as needed for allergies.     No current facility-administered medications for this encounter.   Allergies  Allergen Reactions   Lovastatin Swelling    Patient states that her tongue swells and she gets a tingling feeling all over. Patient states that her tongue swells and she gets a tingling feeling all over.   Lisinopril Swelling    Tongue swelling   Social History   Socioeconomic History   Marital status: Divorced    Spouse name: Not on file   Number of children: Not on file   Years of education: Not on file   Highest education level: Not on file  Occupational History  Not on file  Tobacco Use   Smoking status: Former    Packs/day: 1.00    Years: 40.00    Total pack years: 40.00    Types: Cigarettes    Quit date: 03/20/1985    Years since quitting: 37.0   Smokeless tobacco: Never  Vaping Use   Vaping Use: Never used  Substance and Sexual Activity   Alcohol use: No   Drug use: No   Sexual activity: Yes    Birth control/protection: Surgical  Other Topics Concern   Not on file  Social History Narrative   Mrs Edgley is a 82 year old divorced female who lives alone. She has support of her son and sister.    Social Determinants of Health   Financial Resource Strain: High Risk (11/08/2021)   Overall Financial Resource Strain (CARDIA)    Difficulty of Paying Living Expenses: Very hard  Food Insecurity: No Food Insecurity (03/13/2022)   Hunger Vital Sign    Worried About Running Out of Food in the Last Year: Never true    Ran Out of Food in the Last Year: Never true  Transportation Needs: Unmet Transportation Needs (03/15/2022)   PRAPARE - Transportation    Lack of Transportation (Medical): No    Lack of Transportation (Non-Medical): Yes  Physical Activity: Sufficiently Active (11/08/2021)   Exercise Vital Sign    Days of Exercise per Week: 7 days    Minutes of Exercise per Session: 40 min  Stress: No Stress Concern Present (11/08/2021)   [place]    Feeling of Stress : Not at all  Social Connections: Moderately Isolated (11/08/2021)   Social Connection and Isolation Panel [NHANES]    Frequency of Communication with Friends and Family: Twice a week    Frequency of Social Gatherings with Friends and Family: Once a week    Attends Religious Services: More than 4 times per year    Active Member of [employer] or Organizations: No    Attends Archivist Meetings: Never    Marital Status: Widowed  Intimate Partner Violence: Not At Risk (03/13/2022)   Humiliation, Afraid, Rape, and Kick questionnaire    Fear of Current or Ex-Partner: No    Emotionally Abused: No    Physically Abused: No    Sexually Abused: No   Family History  Problem Relation Age of Onset   High blood pressure Mother    Aneurysm Father    Diabetes Brother    Pancreatitis Brother    Hypertension Niece    Congestive Heart Failure Niece    Wt Readings from Last 3 Encounters:  03/31/22 66.8 kg (147 lb 3.2 oz)  03/20/22 68.4 kg (150 lb 12.7 oz)  02/02/22 71.3 kg (157 lb 3.2 oz)   BP 110/76   Pulse 91   Ht 5\' 3"  (1.6 m)   Wt 66.8 kg (147 lb 3.2 oz)   SpO2 97%   BMI 26.08 kg/m   PHYSICAL EXAM: General:  NAD. No resp difficulty, walked into clinic HEENT: Normal Neck: Supple. No JVD. Carotids 2+ bilat; no bruits. No lymphadenopathy or thryomegaly appreciated. Cor: PMI nondisplaced. Regular rate & rhythm. No rubs, gallops or murmurs. Lungs: Clear Abdomen: Soft, nontender, nondistended. No hepatosplenomegaly. No bruits or masses. Good bowel sounds. Extremities: No cyanosis, clubbing, rash, trace pedal edema, compression hose on Neuro: Alert & oriented x 3, cranial nerves grossly intact. Moves all 4 extremities w/o difficulty. Affect pleasant..  ASSESSMENT & PLAN:  1. Chronic systolic CHF: Nonischemic cardiomyopathy. Cath in 8/23 with no significant CAD, CI 2.24. ?LBBB CMP versus familial versus prior  myocarditis.  Last echo in 12/23 with EF < 20%, moderately reduced RV function, D-shaped septum, mild-moderate MR. She was admitted 1/24 volume overload, empirically started on milrinone 0.25 and Lasix gtt.  Cardiac MRI showed LV severely dilated with EF 16% and septal-lateral dyssynchrony, RV moderately dilated with EF 23%, mid-apical inferolateral LGE in prior infarction pattern. However, CAD does not explain her cardiomyopathy based on recent cath. She has significant peripheral edema, still but suspect venous insufficiency plays a large role here. Today, NYHA II, she is not volume overloaded today. - Start bisoprolol 2.5 mg daily. - Continue Lasix 40 mg daily. BMET and BNP today. - Continue spironolactone 25 mg daily.  - Continue digoxin 0.125 daily. Check dig level today. - Continue Farxiga 10 mg daily. No GU symptoms. - Continue losartan 25 mg bid (angioedema with lisinopril so no Entresto). - Wide LBBB with dyssynchrony by imaging, she would benefit eventually from CRT => refer to EP. - Will need echo when GDMT optimized, will coordinate timing after EP appointment. 2. CKD stage 3: Baseline SCr 1.3-1.5. - BMET today. 3. H/o right cerebellar CVA: On ASA 81 and statin.  4. H/o COPD: Prior smoker. No wheezing on exam today. - Start cardio-selective beta blocker as above.  - Follows with Pulmonary 5. OSA: Encouraged CPAP.   Follow up in 6 weeks with APP and 3 months with Dr. Shirlee Latch.  Prince Rome, FNP-BC 03/31/22

## 2022-03-31 ENCOUNTER — Ambulatory Visit (HOSPITAL_COMMUNITY)
Admission: RE | Admit: 2022-03-31 | Discharge: 2022-03-31 | Disposition: A | Discharge status: Home / Self Care | Payer: Medicare Other | Source: Ambulatory Visit | Attending: Family Medicine | Admitting: Family Medicine

## 2022-03-31 ENCOUNTER — Encounter (HOSPITAL_COMMUNITY): Payer: Self-pay

## 2022-03-31 ENCOUNTER — Encounter (HOSPITAL_COMMUNITY): Payer: Medicare Other

## 2022-03-31 VITALS — BP 110/76 | HR 91 | Ht 63.0 in | Wt 147.2 lb

## 2022-03-31 DIAGNOSIS — M109 Gout, unspecified: Secondary | ICD-10-CM | POA: Diagnosis not present

## 2022-03-31 DIAGNOSIS — G4733 Obstructive sleep apnea (adult) (pediatric): Secondary | ICD-10-CM | POA: Diagnosis not present

## 2022-03-31 DIAGNOSIS — I2721 Secondary pulmonary arterial hypertension: Secondary | ICD-10-CM | POA: Diagnosis not present

## 2022-03-31 DIAGNOSIS — T783XXA Angioneurotic edema, initial encounter: Secondary | ICD-10-CM | POA: Diagnosis not present

## 2022-03-31 DIAGNOSIS — Z8673 Personal history of transient ischemic attack (TIA), and cerebral infarction without residual deficits: Secondary | ICD-10-CM | POA: Diagnosis not present

## 2022-03-31 DIAGNOSIS — Z7984 Long term (current) use of oral hypoglycemic drugs: Secondary | ICD-10-CM | POA: Diagnosis not present

## 2022-03-31 DIAGNOSIS — N1831 Chronic kidney disease, stage 3a: Secondary | ICD-10-CM | POA: Diagnosis not present

## 2022-03-31 DIAGNOSIS — I447 Left bundle-branch block, unspecified: Secondary | ICD-10-CM | POA: Diagnosis not present

## 2022-03-31 DIAGNOSIS — K219 Gastro-esophageal reflux disease without esophagitis: Secondary | ICD-10-CM | POA: Diagnosis not present

## 2022-03-31 DIAGNOSIS — Z5986 Financial insecurity: Secondary | ICD-10-CM | POA: Diagnosis not present

## 2022-03-31 DIAGNOSIS — I5022 Chronic systolic (congestive) heart failure: Secondary | ICD-10-CM | POA: Diagnosis not present

## 2022-03-31 DIAGNOSIS — I2510 Atherosclerotic heart disease of native coronary artery without angina pectoris: Secondary | ICD-10-CM | POA: Insufficient documentation

## 2022-03-31 DIAGNOSIS — Z79899 Other long term (current) drug therapy: Secondary | ICD-10-CM | POA: Diagnosis not present

## 2022-03-31 DIAGNOSIS — I130 Hypertensive heart and chronic kidney disease with heart failure and stage 1 through stage 4 chronic kidney disease, or unspecified chronic kidney disease: Secondary | ICD-10-CM | POA: Insufficient documentation

## 2022-03-31 DIAGNOSIS — Z7982 Long term (current) use of aspirin: Secondary | ICD-10-CM | POA: Diagnosis not present

## 2022-03-31 DIAGNOSIS — J449 Chronic obstructive pulmonary disease, unspecified: Secondary | ICD-10-CM | POA: Insufficient documentation

## 2022-03-31 DIAGNOSIS — I5082 Biventricular heart failure: Secondary | ICD-10-CM | POA: Insufficient documentation

## 2022-03-31 DIAGNOSIS — Z87891 Personal history of nicotine dependence: Secondary | ICD-10-CM | POA: Insufficient documentation

## 2022-03-31 DIAGNOSIS — Z888 Allergy status to other drugs, medicaments and biological substances status: Secondary | ICD-10-CM | POA: Diagnosis not present

## 2022-03-31 DIAGNOSIS — I428 Other cardiomyopathies: Secondary | ICD-10-CM | POA: Diagnosis not present

## 2022-03-31 DIAGNOSIS — N1832 Chronic kidney disease, stage 3b: Secondary | ICD-10-CM | POA: Insufficient documentation

## 2022-03-31 LAB — BASIC METABOLIC PANEL
Anion gap: 11 (ref 5–15)
BUN: 40 mg/dL — ABNORMAL HIGH (ref 8–23)
CO2: 26 mmol/L (ref 22–32)
Calcium: 9.2 mg/dL (ref 8.9–10.3)
Chloride: 100 mmol/L (ref 98–111)
Creatinine, Ser: 1.54 mg/dL — ABNORMAL HIGH (ref 0.44–1.00)
GFR, Estimated: 34 mL/min — ABNORMAL LOW (ref >60)
Glucose, Bld: 104 mg/dL — ABNORMAL HIGH (ref 70–99)
Potassium: 4.8 mmol/L (ref 3.5–5.1)
Sodium: 137 mmol/L (ref 135–145)

## 2022-03-31 LAB — DIGOXIN LEVEL: Digoxin Level: 1.5 ng/mL (ref 0.8–2.0)

## 2022-03-31 MED ORDER — BISOPROLOL FUMARATE 5 MG PO TABS: 2.5000 mg | ORAL_TABLET | Freq: Every day | ORAL | 8 refills | Status: DC

## 2022-03-31 NOTE — Patient Instructions (Addendum)
Thank you for coming in today   Labs were done today, if any labs are abnormal the clinic will call you No news is good news  START Bisoprolol 2.5 mg 1/2 tablet daily  You have been referred to EP clinic and they will contact you for further details of appointment  Your physician recommends that you schedule a follow-up appointment in:  6 weeks in clinic  3 months with Dr. Kendall Flack will receive a reminder letter in the mail a few months in advance. If you don't receive a letter, please call our office to schedule the follow-up appointment.    Do the following things EVERYDAY: Weigh yourself in the morning before breakfast. Write it down and keep it in a log. Take your medicines as prescribed Eat low salt foods--Limit salt (sodium) to 2000 mg per day.  Stay as active as you can everyday Limit all fluids for the day to less than 2 liters  If you have any questions or concerns before your next appointment please send [place] a message through [place] or call our office at [phone].    TO LEAVE A MESSAGE FOR THE NURSE SELECT OPTION 2, PLEASE LEAVE A MESSAGE INCLUDING: YOUR NAME DATE OF BIRTH CALL BACK NUMBER REASON FOR CALL**this is important as we prioritize the call backs  YOU WILL RECEIVE A CALL BACK THE SAME DAY AS LONG AS YOU CALL BEFORE 4:00 PM   At the [facility], you and your health needs are our priority. As part of our continuing mission to provide you with exceptional heart care, we have created designated Provider Care Teams. These Care Teams include your primary Cardiologist (physician) and Advanced Practice Providers (APPs- Physician Assistants and Nurse Practitioners) who all work together to provide you with the care you need, when you need it.   You may see any of the following providers on your designated Care Team at your next follow up: Dr Glori Bickers Dr Loralie Champagne Dr. Roxana Hires, NP Lyda Jester, [place] [facility] [place], [place] Forestine Na, NP Audry Riles, PharmD   Please be sure to bring in all your medications bottles to every appointment.    Thank you for choosing [facility]

## 2022-04-03 ENCOUNTER — Encounter: Payer: Medicare Other | Attending: Physical Medicine & Rehabilitation | Admitting: Physical Medicine & Rehabilitation

## 2022-04-03 ENCOUNTER — Encounter: Payer: Self-pay | Admitting: Physical Medicine & Rehabilitation

## 2022-04-03 ENCOUNTER — Telehealth (HOSPITAL_COMMUNITY): Payer: Self-pay | Admitting: *Deleted

## 2022-04-03 VITALS — BP 100/66 | HR 84 | Ht 63.0 in | Wt 146.0 lb

## 2022-04-03 DIAGNOSIS — N1832 Chronic kidney disease, stage 3b: Secondary | ICD-10-CM | POA: Diagnosis not present

## 2022-04-03 DIAGNOSIS — M545 Low back pain, unspecified: Secondary | ICD-10-CM | POA: Insufficient documentation

## 2022-04-03 DIAGNOSIS — M1990 Unspecified osteoarthritis, unspecified site: Secondary | ICD-10-CM | POA: Diagnosis not present

## 2022-04-03 DIAGNOSIS — I051 Rheumatic mitral insufficiency: Secondary | ICD-10-CM | POA: Diagnosis not present

## 2022-04-03 DIAGNOSIS — F419 Anxiety disorder, unspecified: Secondary | ICD-10-CM | POA: Diagnosis not present

## 2022-04-03 DIAGNOSIS — Z5181 Encounter for therapeutic drug level monitoring: Secondary | ICD-10-CM | POA: Insufficient documentation

## 2022-04-03 DIAGNOSIS — F32A Depression, unspecified: Secondary | ICD-10-CM | POA: Insufficient documentation

## 2022-04-03 DIAGNOSIS — G894 Chronic pain syndrome: Secondary | ICD-10-CM | POA: Insufficient documentation

## 2022-04-03 DIAGNOSIS — I5082 Biventricular heart failure: Secondary | ICD-10-CM | POA: Diagnosis not present

## 2022-04-03 DIAGNOSIS — G8929 Other chronic pain: Secondary | ICD-10-CM | POA: Diagnosis not present

## 2022-04-03 DIAGNOSIS — Z79891 Long term (current) use of opiate analgesic: Secondary | ICD-10-CM | POA: Insufficient documentation

## 2022-04-03 DIAGNOSIS — I2510 Atherosclerotic heart disease of native coronary artery without angina pectoris: Secondary | ICD-10-CM | POA: Diagnosis not present

## 2022-04-03 DIAGNOSIS — I447 Left bundle-branch block, unspecified: Secondary | ICD-10-CM | POA: Diagnosis not present

## 2022-04-03 DIAGNOSIS — I272 Pulmonary hypertension, unspecified: Secondary | ICD-10-CM | POA: Diagnosis not present

## 2022-04-03 DIAGNOSIS — I5023 Acute on chronic systolic (congestive) heart failure: Secondary | ICD-10-CM | POA: Diagnosis not present

## 2022-04-03 DIAGNOSIS — I428 Other cardiomyopathies: Secondary | ICD-10-CM | POA: Diagnosis not present

## 2022-04-03 DIAGNOSIS — M1030 Gout due to renal impairment, unspecified site: Secondary | ICD-10-CM | POA: Diagnosis not present

## 2022-04-03 DIAGNOSIS — I130 Hypertensive heart and chronic kidney disease with heart failure and stage 1 through stage 4 chronic kidney disease, or unspecified chronic kidney disease: Secondary | ICD-10-CM | POA: Diagnosis not present

## 2022-04-03 DIAGNOSIS — E876 Hypokalemia: Secondary | ICD-10-CM | POA: Diagnosis not present

## 2022-04-03 DIAGNOSIS — I5022 Chronic systolic (congestive) heart failure: Secondary | ICD-10-CM

## 2022-04-03 DIAGNOSIS — J449 Chronic obstructive pulmonary disease, unspecified: Secondary | ICD-10-CM | POA: Diagnosis not present

## 2022-04-03 DIAGNOSIS — G4733 Obstructive sleep apnea (adult) (pediatric): Secondary | ICD-10-CM | POA: Diagnosis not present

## 2022-04-03 NOTE — Telephone Encounter (Signed)
-----  Message from Rafael Bihari, [place] sent at 03/31/2022  1:07 PM EST ----- Dig elevated but she had her dose this morning. Please repeat Dig level as a trough next week (make sure she holds her dose before labs).

## 2022-04-03 NOTE — Telephone Encounter (Signed)
[name], RN 04/03/2022  5:25 PM EST Back to Top    Pt aware, agreeable, and verbalized understanding, repeat lab sch Thur 2/1 (that's the soonest she can get transportation), she is aware to hold Dig that A

## 2022-04-03 NOTE — Progress Notes (Addendum)
Subjective:    Patient ID: Heather [name], female    DOB: 06-25-1940, 82 y.o.   MRN: [id number]  HPI   Heather [name] is a 82 y.o. year old female  who  has a past medical history of Anemia, Anxiety, Arthritis, COPD (chronic obstructive pulmonary disease) ([place]), CVA (cerebral vascular accident) ([place]), Depression, Dry eyes (04/14/2014), GERD (gastroesophageal reflux disease), Glaucoma, Gout, HTN (hypertension), and OSA (obstructive sleep apnea).   They are presenting to PM&R clinic as a new patient for pain management evaluation. They were referred or treatment of chronic pain.  Ms. Roubideaux reports she has had back pain for many years.  She had lumbar surgery in 1992 and her pain was controlled until about 2014.  Most of her pain is in her lower back.  Sometimes the pain shoots down her legs on both sides.  Pain has been stable for many years.  She has been on hydrocodone every 6 hours which helps control the pain.  Pain is not particularly changed with activity.  She also has history of right rotator cuff repair.  She has had 2 total hip replacements.    Medications tried: Tylenol- helps slightly  Ibuprofen- stomach issues, can't take NSAIDs Vanlaflaxine - helps her pain Gabapentin- made her itch  Lyrica- not sure if she used this Tramadol- didn't help enough Hydrocodone 5 mg helps the pain.  She uses this about 4 times a day.   Other treatments: PT/OT  - Helped briefly but then had CHF and had to stop exercise and it got worse, she likes riding a bicicyle  ESI didn't help Has not tried TENS unit Facet blocks ? Many years ago Ice and heat helped the pain.   Prior UDS results: No results found for: "LABOPIA", "COCAINSCRNUR", "LABBENZ", "AMPHETMU", "THCU", "LABBARB"   ROS: Review of Systems  Musculoskeletal:  Positive for back pain.  All other systems reviewed and are negative.   There are no diagnoses linked to this encounter.     Pain Inventory Average Pain 8 Pain Right Now  8 My pain is sharp, stabbing, and aching  In the last 24 hours, has pain interfered with the following? General activity 8 Relation with others 8 Enjoyment of life 8 What TIME of day is your pain at its worst? evening Sleep (in general) Fair  Pain is worse with: some activites Pain improves with: medication Relief from Meds: 8  ability to climb steps?  yes do you drive?  yes transfers alone  retired  anxiety suicidal thoughts  Any changes since last visit?  no  Any changes since last visit?  no    Family History  Problem Relation Age of Onset   High blood pressure Mother    Aneurysm Father    Diabetes Brother    Pancreatitis Brother    Hypertension Niece    Congestive Heart Failure Niece    Social History   Socioeconomic History   Marital status: Divorced    Spouse name: Not on file   Number of children: Not on file   Years of education: Not on file   Highest education level: Not on file  Occupational History   Not on file  Tobacco Use   Smoking status: Former    Packs/day: 1.00    Years: 40.00    Total pack years: 40.00    Types: Cigarettes    Quit date: 03/20/1985    Years since quitting: 37.0   Smokeless tobacco: Never  Vaping Use  Vaping Use: Never used  Substance and Sexual Activity   Alcohol use: No   Drug use: No   Sexual activity: Yes    Birth control/protection: Surgical  Other Topics Concern   Not on file  Social History Narrative   Heather [name] is a 82 year old divorced female who lives alone. She has support of her son and sister.    Social Determinants of Health   Financial Resource Strain: High Risk (11/08/2021)   Overall Financial Resource Strain (CARDIA)    Difficulty of Paying Living Expenses: Very hard  Food Insecurity: No Food Insecurity (03/13/2022)   Hunger Vital Sign    Worried About Running Out of Food in the Last Year: Never true    Ran Out of Food in the Last Year: Never true  Transportation Needs: Unmet Transportation  Needs (03/15/2022)   PRAPARE - Transportation    Lack of Transportation (Medical): No    Lack of Transportation (Non-Medical): Yes  Physical Activity: Sufficiently Active (11/08/2021)   Exercise Vital Sign    Days of Exercise per Week: 7 days    Minutes of Exercise per Session: 40 min  Stress: No Stress Concern Present (11/08/2021)   [place]    Feeling of Stress : Not at all  Social Connections: Moderately Isolated (11/08/2021)   Social Connection and Isolation Panel [NHANES]    Frequency of Communication with Friends and Family: Twice a week    Frequency of Social Gatherings with Friends and Family: Once a week    Attends Religious Services: More than 4 times per year    Active Member of [employer] or Organizations: No    Attends Archivist Meetings: Never    Marital Status: Widowed   Past Surgical History:  Procedure Laterality Date   ABDOMINAL HYSTERECTOMY     BACK SURGERY     lumbar-disc   BUNIONECTOMY Bilateral    FOOT SURGERY Left    repair of "kissing Cousins"   JOINT REPLACEMENT Bilateral    hip    JOINT REPLACEMENT Right 2010 or 2012   knee   KNEE SURGERY     RIGHT/LEFT HEART CATH AND CORONARY ANGIOGRAPHY N/A 10/27/2021   Procedure: RIGHT/LEFT HEART CATH AND CORONARY ANGIOGRAPHY;  Surgeon: Jettie Booze, MD;  Location: [facility];  Service: Cardiovascular;  Laterality: N/A;   SHOULDER ARTHROSCOPY Right    shoulder surgery in [place] about 2000   SHOULDER HEMI-ARTHROPLASTY Left 03/25/2014   Procedure: LEFT SHOULDER HEMI-ARTHROPLASTY;  Surgeon: Carole Civil, MD;  Location: [facility];  Service: Orthopedics;  Laterality: Left;   Past Medical History:  Diagnosis Date   Anemia    Anxiety    Arthritis    COPD (chronic obstructive pulmonary disease) (H[facility])    CVA (cerebral vascular accident) ([place])    Depression    Dry eyes 04/14/2014   GERD (gastroesophageal reflux disease)    Glaucoma     Gout    HTN (hypertension)    OSA (obstructive sleep apnea)    BP 100/66   Pulse 84   Ht '5\' 3"'$  (1.6 m)   Wt 146 lb (66.2 kg)   SpO2 94%   BMI 25.86 kg/m   Opioid Risk Score:   Fall Risk Score:  `1  Depression screen [facility] 2/9     04/03/2022   10:33 AM 02/02/2022    9:54 AM 11/08/2021    2:26 PM 11/08/2021    2:25 PM 11/03/2021  2:18 PM 06/30/2020   10:31 AM 02/03/2020    1:51 PM  Depression screen PHQ 2/9  Decreased Interest 1 0 0 0 0 0 0  Down, Depressed, Hopeless 0 0 0 0 0 0 0  PHQ - 2 Score 1 0 0 0 0 0 0  Altered sleeping 2 0       Tired, decreased energy 1 2       Change in appetite 1 1       Feeling bad or failure about yourself  1 0       Trouble concentrating 0 0       Moving slowly or fidgety/restless 1 0       Suicidal thoughts 0 0       PHQ-9 Score 7 3       Difficult doing work/chores Not difficult at all            Review of Systems  Musculoskeletal:  Positive for back pain.  All other systems reviewed and are negative.      Objective:   Physical Exam   Gen: no distress, normal appearing HEENT: oral mucosa pink and moist, NCAT Chest: normal effort, normal rate of breathing Abd: soft, non-distended Ext: no edema Psych: pleasant, normal affect Skin: intact Neuro: Alert and oriented, follows commands, cranial nerves II through XII grossly intact, normal speech and language Strength 5 out of 5 in all 4 extremities Sensation intact light touch in all 4 extremities Finger-nose intact bilaterally Musculoskeletal:  Slightly decreased forward flexion of L-spine Spurling's negative Walks without cane SLR negative bilaterally Facet loading negative FABER FADIR negative Lumbar paraspinal muscle tenderness on exam  10/06/16 IMPRESSION: 1. 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 This cyst partially effaces the thecal sac and impinges on the exiting T12 nerve root. 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 Further description above. 2. Lower lumbar facet arthropathy with grade 1 anterolisthesis at L4-5 and L5-S1. 3. 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 4. Prior L5-S1 decompressive laminectomy with patent thecal sac. 5. Prominent iliopsoas atrophy bilaterally. Status post bilateral hip replacement.  Assessment & Plan:  Assessment and Plan: MARKEYSHA ERSKINE is a 82 y.o. year old female  who  has a past medical history of Anemia, Anxiety, Arthritis, COPD (chronic obstructive pulmonary disease) ([place]), CVA (cerebral vascular accident) ([place]), Depression, Dry eyes (04/14/2014), GERD (gastroesophageal reflux disease), Glaucoma, Gout, HTN (hypertension), and OSA (obstructive sleep apnea).   They are presenting to PM&R clinic as a new patient for treatment of chronic back pain.    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 and piror lumbar surgery -She is not interested in a surgical treatment -Consider physical therapy treatment after she has her pacemaker placed -Continue hydorocodone 5 mg 4 times a day pending results of UDS -She reports she last took hydrocodone this morning -Consult for zynax device, discussed trying tens unit -Continue Venlafaxine  Depression Well controlled with vanlfexine per patient   04/12/22 called pt, UDS appears consistant, advised her to call about 5 days prior to needing refill, no questions 05/02/22 Hydrocodone ordered as discussed, ordered vanlafaxine '75mg'$  daily- Discussed with Cardiology Dr. Aundra Dubin and Audry Riles due to hx of elevated QT 499- appreciate assistance, Ok to continue for now- cardiology may monitor with EKG

## 2022-04-04 DIAGNOSIS — M1030 Gout due to renal impairment, unspecified site: Secondary | ICD-10-CM | POA: Diagnosis not present

## 2022-04-04 DIAGNOSIS — I130 Hypertensive heart and chronic kidney disease with heart failure and stage 1 through stage 4 chronic kidney disease, or unspecified chronic kidney disease: Secondary | ICD-10-CM | POA: Diagnosis not present

## 2022-04-04 DIAGNOSIS — I5082 Biventricular heart failure: Secondary | ICD-10-CM | POA: Diagnosis not present

## 2022-04-04 DIAGNOSIS — M1990 Unspecified osteoarthritis, unspecified site: Secondary | ICD-10-CM | POA: Diagnosis not present

## 2022-04-04 DIAGNOSIS — I5023 Acute on chronic systolic (congestive) heart failure: Secondary | ICD-10-CM | POA: Diagnosis not present

## 2022-04-04 DIAGNOSIS — I2510 Atherosclerotic heart disease of native coronary artery without angina pectoris: Secondary | ICD-10-CM | POA: Diagnosis not present

## 2022-04-04 DIAGNOSIS — I447 Left bundle-branch block, unspecified: Secondary | ICD-10-CM | POA: Diagnosis not present

## 2022-04-04 DIAGNOSIS — I051 Rheumatic mitral insufficiency: Secondary | ICD-10-CM | POA: Diagnosis not present

## 2022-04-04 DIAGNOSIS — N1832 Chronic kidney disease, stage 3b: Secondary | ICD-10-CM | POA: Diagnosis not present

## 2022-04-04 DIAGNOSIS — I428 Other cardiomyopathies: Secondary | ICD-10-CM | POA: Diagnosis not present

## 2022-04-04 DIAGNOSIS — I272 Pulmonary hypertension, unspecified: Secondary | ICD-10-CM | POA: Diagnosis not present

## 2022-04-04 DIAGNOSIS — J449 Chronic obstructive pulmonary disease, unspecified: Secondary | ICD-10-CM | POA: Diagnosis not present

## 2022-04-04 DIAGNOSIS — F32A Depression, unspecified: Secondary | ICD-10-CM | POA: Diagnosis not present

## 2022-04-04 DIAGNOSIS — F419 Anxiety disorder, unspecified: Secondary | ICD-10-CM | POA: Diagnosis not present

## 2022-04-04 DIAGNOSIS — G4733 Obstructive sleep apnea (adult) (pediatric): Secondary | ICD-10-CM | POA: Diagnosis not present

## 2022-04-04 DIAGNOSIS — E876 Hypokalemia: Secondary | ICD-10-CM | POA: Diagnosis not present

## 2022-04-06 ENCOUNTER — Ambulatory Visit (HOSPITAL_COMMUNITY)
Admission: RE | Admit: 2022-04-06 | Discharge: 2022-04-06 | Disposition: A | Discharge status: Home / Self Care | Payer: Medicare Other | Source: Ambulatory Visit | Attending: Cardiology | Admitting: Cardiology

## 2022-04-06 DIAGNOSIS — I5022 Chronic systolic (congestive) heart failure: Secondary | ICD-10-CM

## 2022-04-06 DIAGNOSIS — Z79899 Other long term (current) drug therapy: Secondary | ICD-10-CM | POA: Diagnosis not present

## 2022-04-06 LAB — TOXASSURE SELECT,+ANTIDEPR,UR

## 2022-04-06 LAB — DIGOXIN LEVEL: Digoxin Level: 1.2 ng/mL (ref 0.8–2.0)

## 2022-04-07 ENCOUNTER — Telehealth (HOSPITAL_COMMUNITY): Payer: Self-pay

## 2022-04-07 DIAGNOSIS — F419 Anxiety disorder, unspecified: Secondary | ICD-10-CM | POA: Diagnosis not present

## 2022-04-07 DIAGNOSIS — I2510 Atherosclerotic heart disease of native coronary artery without angina pectoris: Secondary | ICD-10-CM | POA: Diagnosis not present

## 2022-04-07 DIAGNOSIS — G4733 Obstructive sleep apnea (adult) (pediatric): Secondary | ICD-10-CM | POA: Diagnosis not present

## 2022-04-07 DIAGNOSIS — I5082 Biventricular heart failure: Secondary | ICD-10-CM | POA: Diagnosis not present

## 2022-04-07 DIAGNOSIS — I428 Other cardiomyopathies: Secondary | ICD-10-CM | POA: Diagnosis not present

## 2022-04-07 DIAGNOSIS — I130 Hypertensive heart and chronic kidney disease with heart failure and stage 1 through stage 4 chronic kidney disease, or unspecified chronic kidney disease: Secondary | ICD-10-CM | POA: Diagnosis not present

## 2022-04-07 DIAGNOSIS — I272 Pulmonary hypertension, unspecified: Secondary | ICD-10-CM | POA: Diagnosis not present

## 2022-04-07 DIAGNOSIS — N1832 Chronic kidney disease, stage 3b: Secondary | ICD-10-CM | POA: Diagnosis not present

## 2022-04-07 DIAGNOSIS — I051 Rheumatic mitral insufficiency: Secondary | ICD-10-CM | POA: Diagnosis not present

## 2022-04-07 DIAGNOSIS — M1990 Unspecified osteoarthritis, unspecified site: Secondary | ICD-10-CM | POA: Diagnosis not present

## 2022-04-07 DIAGNOSIS — I5023 Acute on chronic systolic (congestive) heart failure: Secondary | ICD-10-CM | POA: Diagnosis not present

## 2022-04-07 DIAGNOSIS — I447 Left bundle-branch block, unspecified: Secondary | ICD-10-CM | POA: Diagnosis not present

## 2022-04-07 DIAGNOSIS — I5022 Chronic systolic (congestive) heart failure: Secondary | ICD-10-CM

## 2022-04-07 DIAGNOSIS — F32A Depression, unspecified: Secondary | ICD-10-CM | POA: Diagnosis not present

## 2022-04-07 DIAGNOSIS — M1030 Gout due to renal impairment, unspecified site: Secondary | ICD-10-CM | POA: Diagnosis not present

## 2022-04-07 DIAGNOSIS — E876 Hypokalemia: Secondary | ICD-10-CM | POA: Diagnosis not present

## 2022-04-07 DIAGNOSIS — J449 Chronic obstructive pulmonary disease, unspecified: Secondary | ICD-10-CM | POA: Diagnosis not present

## 2022-04-07 MED ORDER — DIGOXIN 125 MCG PO TABS: 0.0625 mg | ORAL_TABLET | Freq: Every day | ORAL | 5 refills | Status: DC

## 2022-04-07 NOTE — Telephone Encounter (Addendum)
Pt aware, agreeable, and verbalized understanding  Labs ordered Ex up dated   ----- Message from Rafael Bihari, FNP sent at 04/07/2022  7:54 AM EST ----- Dig level remains elevated. Hold digoxin x 3 days, then resume at half dose of 0.0625 daily.  Repeat dig trough next week

## 2022-04-10 DIAGNOSIS — I5023 Acute on chronic systolic (congestive) heart failure: Secondary | ICD-10-CM | POA: Diagnosis not present

## 2022-04-10 DIAGNOSIS — I2510 Atherosclerotic heart disease of native coronary artery without angina pectoris: Secondary | ICD-10-CM | POA: Diagnosis not present

## 2022-04-10 DIAGNOSIS — I447 Left bundle-branch block, unspecified: Secondary | ICD-10-CM | POA: Diagnosis not present

## 2022-04-10 DIAGNOSIS — I272 Pulmonary hypertension, unspecified: Secondary | ICD-10-CM | POA: Diagnosis not present

## 2022-04-10 DIAGNOSIS — E876 Hypokalemia: Secondary | ICD-10-CM | POA: Diagnosis not present

## 2022-04-10 DIAGNOSIS — I051 Rheumatic mitral insufficiency: Secondary | ICD-10-CM | POA: Diagnosis not present

## 2022-04-10 DIAGNOSIS — F32A Depression, unspecified: Secondary | ICD-10-CM | POA: Diagnosis not present

## 2022-04-10 DIAGNOSIS — I5082 Biventricular heart failure: Secondary | ICD-10-CM | POA: Diagnosis not present

## 2022-04-10 DIAGNOSIS — I428 Other cardiomyopathies: Secondary | ICD-10-CM | POA: Diagnosis not present

## 2022-04-10 DIAGNOSIS — M1030 Gout due to renal impairment, unspecified site: Secondary | ICD-10-CM | POA: Diagnosis not present

## 2022-04-10 DIAGNOSIS — J449 Chronic obstructive pulmonary disease, unspecified: Secondary | ICD-10-CM | POA: Diagnosis not present

## 2022-04-10 DIAGNOSIS — F419 Anxiety disorder, unspecified: Secondary | ICD-10-CM | POA: Diagnosis not present

## 2022-04-10 DIAGNOSIS — I130 Hypertensive heart and chronic kidney disease with heart failure and stage 1 through stage 4 chronic kidney disease, or unspecified chronic kidney disease: Secondary | ICD-10-CM | POA: Diagnosis not present

## 2022-04-10 DIAGNOSIS — G4733 Obstructive sleep apnea (adult) (pediatric): Secondary | ICD-10-CM | POA: Diagnosis not present

## 2022-04-10 DIAGNOSIS — M1990 Unspecified osteoarthritis, unspecified site: Secondary | ICD-10-CM | POA: Diagnosis not present

## 2022-04-10 DIAGNOSIS — N1832 Chronic kidney disease, stage 3b: Secondary | ICD-10-CM | POA: Diagnosis not present

## 2022-04-11 ENCOUNTER — Other Ambulatory Visit (HOSPITAL_COMMUNITY)
Admission: RE | Admit: 2022-04-11 | Discharge: 2022-04-11 | Disposition: A | Discharge status: Home / Self Care | Payer: Medicare Other | Source: Ambulatory Visit | Attending: Family Medicine | Admitting: Family Medicine

## 2022-04-11 DIAGNOSIS — I5022 Chronic systolic (congestive) heart failure: Secondary | ICD-10-CM | POA: Diagnosis not present

## 2022-04-11 LAB — DIGOXIN LEVEL: Digoxin Level: 0.4 ng/mL — ABNORMAL LOW (ref 0.8–2.0)

## 2022-04-17 ENCOUNTER — Other Ambulatory Visit (HOSPITAL_COMMUNITY): Payer: Self-pay

## 2022-04-17 DIAGNOSIS — M1030 Gout due to renal impairment, unspecified site: Secondary | ICD-10-CM | POA: Diagnosis not present

## 2022-04-17 DIAGNOSIS — I2510 Atherosclerotic heart disease of native coronary artery without angina pectoris: Secondary | ICD-10-CM | POA: Diagnosis not present

## 2022-04-17 DIAGNOSIS — I428 Other cardiomyopathies: Secondary | ICD-10-CM | POA: Diagnosis not present

## 2022-04-17 DIAGNOSIS — N1832 Chronic kidney disease, stage 3b: Secondary | ICD-10-CM | POA: Diagnosis not present

## 2022-04-17 DIAGNOSIS — I447 Left bundle-branch block, unspecified: Secondary | ICD-10-CM | POA: Diagnosis not present

## 2022-04-17 DIAGNOSIS — I130 Hypertensive heart and chronic kidney disease with heart failure and stage 1 through stage 4 chronic kidney disease, or unspecified chronic kidney disease: Secondary | ICD-10-CM | POA: Diagnosis not present

## 2022-04-17 DIAGNOSIS — I272 Pulmonary hypertension, unspecified: Secondary | ICD-10-CM | POA: Diagnosis not present

## 2022-04-17 DIAGNOSIS — G4733 Obstructive sleep apnea (adult) (pediatric): Secondary | ICD-10-CM | POA: Diagnosis not present

## 2022-04-17 DIAGNOSIS — I5082 Biventricular heart failure: Secondary | ICD-10-CM | POA: Diagnosis not present

## 2022-04-17 DIAGNOSIS — I5023 Acute on chronic systolic (congestive) heart failure: Secondary | ICD-10-CM | POA: Diagnosis not present

## 2022-04-17 DIAGNOSIS — I051 Rheumatic mitral insufficiency: Secondary | ICD-10-CM | POA: Diagnosis not present

## 2022-04-17 DIAGNOSIS — J449 Chronic obstructive pulmonary disease, unspecified: Secondary | ICD-10-CM | POA: Diagnosis not present

## 2022-04-17 DIAGNOSIS — M1990 Unspecified osteoarthritis, unspecified site: Secondary | ICD-10-CM | POA: Diagnosis not present

## 2022-04-17 DIAGNOSIS — E876 Hypokalemia: Secondary | ICD-10-CM | POA: Diagnosis not present

## 2022-04-17 DIAGNOSIS — F419 Anxiety disorder, unspecified: Secondary | ICD-10-CM | POA: Diagnosis not present

## 2022-04-17 DIAGNOSIS — F32A Depression, unspecified: Secondary | ICD-10-CM | POA: Diagnosis not present

## 2022-04-18 ENCOUNTER — Other Ambulatory Visit (HOSPITAL_COMMUNITY): Payer: Self-pay

## 2022-04-19 DIAGNOSIS — E876 Hypokalemia: Secondary | ICD-10-CM | POA: Diagnosis not present

## 2022-04-19 DIAGNOSIS — I130 Hypertensive heart and chronic kidney disease with heart failure and stage 1 through stage 4 chronic kidney disease, or unspecified chronic kidney disease: Secondary | ICD-10-CM | POA: Diagnosis not present

## 2022-04-19 DIAGNOSIS — I051 Rheumatic mitral insufficiency: Secondary | ICD-10-CM | POA: Diagnosis not present

## 2022-04-19 DIAGNOSIS — I272 Pulmonary hypertension, unspecified: Secondary | ICD-10-CM | POA: Diagnosis not present

## 2022-04-19 DIAGNOSIS — F419 Anxiety disorder, unspecified: Secondary | ICD-10-CM | POA: Diagnosis not present

## 2022-04-19 DIAGNOSIS — I5023 Acute on chronic systolic (congestive) heart failure: Secondary | ICD-10-CM | POA: Diagnosis not present

## 2022-04-19 DIAGNOSIS — I5082 Biventricular heart failure: Secondary | ICD-10-CM | POA: Diagnosis not present

## 2022-04-19 DIAGNOSIS — I2510 Atherosclerotic heart disease of native coronary artery without angina pectoris: Secondary | ICD-10-CM | POA: Diagnosis not present

## 2022-04-19 DIAGNOSIS — J449 Chronic obstructive pulmonary disease, unspecified: Secondary | ICD-10-CM | POA: Diagnosis not present

## 2022-04-19 DIAGNOSIS — M1030 Gout due to renal impairment, unspecified site: Secondary | ICD-10-CM | POA: Diagnosis not present

## 2022-04-19 DIAGNOSIS — G4733 Obstructive sleep apnea (adult) (pediatric): Secondary | ICD-10-CM | POA: Diagnosis not present

## 2022-04-19 DIAGNOSIS — F32A Depression, unspecified: Secondary | ICD-10-CM | POA: Diagnosis not present

## 2022-04-19 DIAGNOSIS — I428 Other cardiomyopathies: Secondary | ICD-10-CM | POA: Diagnosis not present

## 2022-04-19 DIAGNOSIS — M1990 Unspecified osteoarthritis, unspecified site: Secondary | ICD-10-CM | POA: Diagnosis not present

## 2022-04-19 DIAGNOSIS — N1832 Chronic kidney disease, stage 3b: Secondary | ICD-10-CM | POA: Diagnosis not present

## 2022-04-19 DIAGNOSIS — I447 Left bundle-branch block, unspecified: Secondary | ICD-10-CM | POA: Diagnosis not present

## 2022-04-20 ENCOUNTER — Other Ambulatory Visit (HOSPITAL_COMMUNITY): Payer: Self-pay

## 2022-04-20 DIAGNOSIS — I5023 Acute on chronic systolic (congestive) heart failure: Secondary | ICD-10-CM | POA: Diagnosis not present

## 2022-04-20 DIAGNOSIS — I051 Rheumatic mitral insufficiency: Secondary | ICD-10-CM | POA: Diagnosis not present

## 2022-04-20 DIAGNOSIS — J449 Chronic obstructive pulmonary disease, unspecified: Secondary | ICD-10-CM | POA: Diagnosis not present

## 2022-04-20 DIAGNOSIS — I447 Left bundle-branch block, unspecified: Secondary | ICD-10-CM | POA: Diagnosis not present

## 2022-04-20 DIAGNOSIS — I5082 Biventricular heart failure: Secondary | ICD-10-CM | POA: Diagnosis not present

## 2022-04-20 DIAGNOSIS — M1030 Gout due to renal impairment, unspecified site: Secondary | ICD-10-CM | POA: Diagnosis not present

## 2022-04-20 DIAGNOSIS — I130 Hypertensive heart and chronic kidney disease with heart failure and stage 1 through stage 4 chronic kidney disease, or unspecified chronic kidney disease: Secondary | ICD-10-CM | POA: Diagnosis not present

## 2022-04-20 DIAGNOSIS — F32A Depression, unspecified: Secondary | ICD-10-CM | POA: Diagnosis not present

## 2022-04-20 DIAGNOSIS — N1832 Chronic kidney disease, stage 3b: Secondary | ICD-10-CM | POA: Diagnosis not present

## 2022-04-20 DIAGNOSIS — G4733 Obstructive sleep apnea (adult) (pediatric): Secondary | ICD-10-CM | POA: Diagnosis not present

## 2022-04-20 DIAGNOSIS — I428 Other cardiomyopathies: Secondary | ICD-10-CM | POA: Diagnosis not present

## 2022-04-20 DIAGNOSIS — F419 Anxiety disorder, unspecified: Secondary | ICD-10-CM | POA: Diagnosis not present

## 2022-04-20 DIAGNOSIS — I272 Pulmonary hypertension, unspecified: Secondary | ICD-10-CM | POA: Diagnosis not present

## 2022-04-20 DIAGNOSIS — E876 Hypokalemia: Secondary | ICD-10-CM | POA: Diagnosis not present

## 2022-04-20 DIAGNOSIS — M1990 Unspecified osteoarthritis, unspecified site: Secondary | ICD-10-CM | POA: Diagnosis not present

## 2022-04-20 DIAGNOSIS — I2510 Atherosclerotic heart disease of native coronary artery without angina pectoris: Secondary | ICD-10-CM | POA: Diagnosis not present

## 2022-04-24 ENCOUNTER — Institutional Professional Consult (permissible substitution): Payer: Medicare Other | Admitting: Internal Medicine

## 2022-04-24 DIAGNOSIS — I5022 Chronic systolic (congestive) heart failure: Secondary | ICD-10-CM

## 2022-04-24 DIAGNOSIS — I447 Left bundle-branch block, unspecified: Secondary | ICD-10-CM

## 2022-04-24 NOTE — Progress Notes (Deleted)
ELECTROPHYSIOLOGY CONSULT NOTE  Patient ID: TRENAY DRAXLER, MRN: [id number], DOB/AGE: 14-Mar-1940 82 y.o. Admit date: (Not on file) Date of Consult: 04/24/2022  Primary Physician: Johnette Abraham, MD Primary Cardiologist: ***     CONNOR CLENDENNEN is a 82 y.o. female who is being seen today for the evaluation of *** at the request of ***.    HPI LEIDI ZARRA is a 82 y.o. female referred for consideration of CRT  DATE TEST EF   8/23 Echo   15-20 %   8/23 LHC    % Nonobstructive CAD  12/23 Echo  <15%   1/24 cMRI 16% Prior MI   Date Cr K Hgb  ***/*** *** *** ***  ***/*** *** *** ***      Past Medical History:  Diagnosis Date   Anemia    Anxiety    Arthritis    COPD (chronic obstructive pulmonary disease) (H[facility])    CVA (cerebral vascular accident) ([employer])    Depression    Dry eyes 04/14/2014   GERD (gastroesophageal reflux disease)    Glaucoma    Gout    HTN (hypertension)    OSA (obstructive sleep apnea)        Surgical History:  Past Surgical History:  Procedure Laterality Date   ABDOMINAL HYSTERECTOMY     BACK SURGERY     lumbar-disc   BUNIONECTOMY Bilateral    FOOT SURGERY Left    repair of "kissing Cousins"   JOINT REPLACEMENT Bilateral    hip    JOINT REPLACEMENT Right 2010 or 2012   knee   KNEE SURGERY     RIGHT/LEFT HEART CATH AND CORONARY ANGIOGRAPHY N/A 10/27/2021   Procedure: RIGHT/LEFT HEART CATH AND CORONARY ANGIOGRAPHY;  Surgeon: Jettie Booze, MD;  Location: [facility];  Service: Cardiovascular;  Laterality: N/A;   SHOULDER ARTHROSCOPY Right    shoulder surgery in [place] about 2000   SHOULDER HEMI-ARTHROPLASTY Left 03/25/2014   Procedure: LEFT SHOULDER HEMI-ARTHROPLASTY;  Surgeon: Carole Civil, MD;  Location: [facility];  Service: Orthopedics;  Laterality: Left;     Home Meds: No outpatient medications have been marked as taking for the 04/24/22 encounter (Appointment) with Deboraha Sprang, MD.    Allergies:   Allergies  Allergen Reactions   Lovastatin Swelling    Patient states that her tongue swells and she gets a tingling feeling all over. Patient states that her tongue swells and she gets a tingling feeling all over.   Lisinopril Swelling    Tongue swelling    Social History   Socioeconomic History   Marital status: Divorced    Spouse name: Not on file   Number of children: Not on file   Years of education: Not on file   Highest education level: Not on file  Occupational History   Not on file  Tobacco Use   Smoking status: Former    Packs/day: 1.00    Years: 40.00    Total pack years: 40.00    Types: Cigarettes    Quit date: 03/20/1985    Years since quitting: 37.1   Smokeless tobacco: Never  Vaping Use   Vaping Use: Never used  Substance and Sexual Activity   Alcohol use: No   Drug use: No   Sexual activity: Yes    Birth control/protection: Surgical  Other Topics Concern   Not on file  Social History Narrative   Mrs Mankin is a 82 year old  divorced female who lives alone. She has support of her son and sister.    Social Determinants of Health   Financial Resource Strain: High Risk (11/08/2021)   Overall Financial Resource Strain (CARDIA)    Difficulty of Paying Living Expenses: Very hard  Food Insecurity: No Food Insecurity (03/13/2022)   Hunger Vital Sign    Worried About Running Out of Food in the Last Year: Never true    Ran Out of Food in the Last Year: Never true  Transportation Needs: Unmet Transportation Needs (03/15/2022)   PRAPARE - Transportation    Lack of Transportation (Medical): No    Lack of Transportation (Non-Medical): Yes  Physical Activity: Sufficiently Active (11/08/2021)   Exercise Vital Sign    Days of Exercise per Week: 7 days    Minutes of Exercise per Session: 40 min  Stress: No Stress Concern Present (11/08/2021)   [place]    Feeling of Stress : Not at all  Social  Connections: Moderately Isolated (11/08/2021)   Social Connection and Isolation Panel [NHANES]    Frequency of Communication with Friends and Family: Twice a week    Frequency of Social Gatherings with Friends and Family: Once a week    Attends Religious Services: More than 4 times per year    Active Member of [employer] or Organizations: No    Attends Archivist Meetings: Never    Marital Status: Widowed  Intimate Partner Violence: Not At Risk (03/13/2022)   Humiliation, Afraid, Rape, and Kick questionnaire    Fear of Current or Ex-Partner: No    Emotionally Abused: No    Physically Abused: No    Sexually Abused: No     Family History  Problem Relation Age of Onset   High blood pressure Mother    Aneurysm Father    Diabetes Brother    Pancreatitis Brother    Hypertension Niece    Congestive Heart Failure Niece      ROS:  Please see the history of present illness.   {ros [id number]}  All other systems reviewed and negative.    Physical Exam:*** There were no vitals taken for this visit. General: Well developed, well nourished female in no acute distress. Head: Normocephalic, atraumatic, sclera non-icteric, no xanthomas, nares are without discharge. EENT: normal  Lymph Nodes:  none Neck: Negative for carotid bruits. JVD not elevated. Back:without scoliosis kyphosis*** Lungs: Clear bilaterally to auscultation without wheezes, rales, or rhonchi. Breathing is unlabored. Heart: RRR with S1 S2. No *** ***/6 systolic*** murmur . No rubs, or gallops appreciated. Abdomen: Soft, non-tender, non-distended with normoactive bowel sounds. No hepatomegaly. No rebound/guarding. No obvious abdominal masses. Msk:  Strength and tone appear normal for age. Extremities: No clubbing or cyanosis. No*** ***+*** edema.  Distal pedal pulses are 2+ and equal bilaterally. Skin: Warm and Dry Neuro: Alert and oriented X 3. CN III-XII intact Grossly normal sensory and motor function . Psych:   Responds to questions appropriately with a normal affect.        EKG: ***   Assessment and Plan: *** Virl A[place]

## 2022-04-27 DIAGNOSIS — I272 Pulmonary hypertension, unspecified: Secondary | ICD-10-CM | POA: Diagnosis not present

## 2022-04-27 DIAGNOSIS — M1030 Gout due to renal impairment, unspecified site: Secondary | ICD-10-CM | POA: Diagnosis not present

## 2022-04-27 DIAGNOSIS — G4733 Obstructive sleep apnea (adult) (pediatric): Secondary | ICD-10-CM | POA: Diagnosis not present

## 2022-04-27 DIAGNOSIS — J449 Chronic obstructive pulmonary disease, unspecified: Secondary | ICD-10-CM | POA: Diagnosis not present

## 2022-04-27 DIAGNOSIS — F32A Depression, unspecified: Secondary | ICD-10-CM | POA: Diagnosis not present

## 2022-04-27 DIAGNOSIS — I130 Hypertensive heart and chronic kidney disease with heart failure and stage 1 through stage 4 chronic kidney disease, or unspecified chronic kidney disease: Secondary | ICD-10-CM | POA: Diagnosis not present

## 2022-04-27 DIAGNOSIS — N1832 Chronic kidney disease, stage 3b: Secondary | ICD-10-CM | POA: Diagnosis not present

## 2022-04-27 DIAGNOSIS — I2510 Atherosclerotic heart disease of native coronary artery without angina pectoris: Secondary | ICD-10-CM | POA: Diagnosis not present

## 2022-04-27 DIAGNOSIS — I5082 Biventricular heart failure: Secondary | ICD-10-CM | POA: Diagnosis not present

## 2022-04-27 DIAGNOSIS — I051 Rheumatic mitral insufficiency: Secondary | ICD-10-CM | POA: Diagnosis not present

## 2022-04-27 DIAGNOSIS — I428 Other cardiomyopathies: Secondary | ICD-10-CM | POA: Diagnosis not present

## 2022-04-27 DIAGNOSIS — E876 Hypokalemia: Secondary | ICD-10-CM | POA: Diagnosis not present

## 2022-04-27 DIAGNOSIS — I5023 Acute on chronic systolic (congestive) heart failure: Secondary | ICD-10-CM | POA: Diagnosis not present

## 2022-04-27 DIAGNOSIS — I447 Left bundle-branch block, unspecified: Secondary | ICD-10-CM | POA: Diagnosis not present

## 2022-04-27 DIAGNOSIS — F419 Anxiety disorder, unspecified: Secondary | ICD-10-CM | POA: Diagnosis not present

## 2022-04-27 DIAGNOSIS — M1990 Unspecified osteoarthritis, unspecified site: Secondary | ICD-10-CM | POA: Diagnosis not present

## 2022-05-01 ENCOUNTER — Telehealth: Payer: Self-pay | Admitting: Physical Medicine & Rehabilitation

## 2022-05-01 NOTE — Addendum Note (Signed)
Addended by: Jennye Boroughs on: 05/01/2022 11:10 PM   Modules accepted: Orders

## 2022-05-01 NOTE — Telephone Encounter (Signed)
ERROR

## 2022-05-01 NOTE — Telephone Encounter (Signed)
When patient was seen she was asked to call when medications were needed. She needs Hydrocodone 5-32 and venlafaxine 37.5 mg. Please send to [place], [address] [place]

## 2022-05-02 DIAGNOSIS — M1030 Gout due to renal impairment, unspecified site: Secondary | ICD-10-CM | POA: Diagnosis not present

## 2022-05-02 DIAGNOSIS — I447 Left bundle-branch block, unspecified: Secondary | ICD-10-CM | POA: Diagnosis not present

## 2022-05-02 DIAGNOSIS — I130 Hypertensive heart and chronic kidney disease with heart failure and stage 1 through stage 4 chronic kidney disease, or unspecified chronic kidney disease: Secondary | ICD-10-CM | POA: Diagnosis not present

## 2022-05-02 DIAGNOSIS — I428 Other cardiomyopathies: Secondary | ICD-10-CM | POA: Diagnosis not present

## 2022-05-02 DIAGNOSIS — I5023 Acute on chronic systolic (congestive) heart failure: Secondary | ICD-10-CM | POA: Diagnosis not present

## 2022-05-02 DIAGNOSIS — F419 Anxiety disorder, unspecified: Secondary | ICD-10-CM | POA: Diagnosis not present

## 2022-05-02 DIAGNOSIS — G4733 Obstructive sleep apnea (adult) (pediatric): Secondary | ICD-10-CM | POA: Diagnosis not present

## 2022-05-02 DIAGNOSIS — I5082 Biventricular heart failure: Secondary | ICD-10-CM | POA: Diagnosis not present

## 2022-05-02 DIAGNOSIS — I051 Rheumatic mitral insufficiency: Secondary | ICD-10-CM | POA: Diagnosis not present

## 2022-05-02 DIAGNOSIS — M1990 Unspecified osteoarthritis, unspecified site: Secondary | ICD-10-CM | POA: Diagnosis not present

## 2022-05-02 DIAGNOSIS — I2510 Atherosclerotic heart disease of native coronary artery without angina pectoris: Secondary | ICD-10-CM | POA: Diagnosis not present

## 2022-05-02 DIAGNOSIS — J449 Chronic obstructive pulmonary disease, unspecified: Secondary | ICD-10-CM | POA: Diagnosis not present

## 2022-05-02 DIAGNOSIS — E876 Hypokalemia: Secondary | ICD-10-CM | POA: Diagnosis not present

## 2022-05-02 DIAGNOSIS — I272 Pulmonary hypertension, unspecified: Secondary | ICD-10-CM | POA: Diagnosis not present

## 2022-05-02 DIAGNOSIS — N1832 Chronic kidney disease, stage 3b: Secondary | ICD-10-CM | POA: Diagnosis not present

## 2022-05-02 DIAGNOSIS — F32A Depression, unspecified: Secondary | ICD-10-CM | POA: Diagnosis not present

## 2022-05-02 MED ORDER — VENLAFAXINE HCL ER 37.5 MG PO CP24: 75.0000 mg | ORAL_CAPSULE | ORAL | 2 refills | Status: DC

## 2022-05-02 MED ORDER — HYDROCODONE-ACETAMINOPHEN 5-325 MG PO TABS: 1.0000 | ORAL_TABLET | Freq: Four times a day (QID) | ORAL | 0 refills | Status: DC | PRN

## 2022-05-02 NOTE — Addendum Note (Signed)
Addended by: Jennye Boroughs on: 05/02/2022 03:56 PM   Modules accepted: Orders

## 2022-05-04 ENCOUNTER — Ambulatory Visit: Payer: Medicare HMO | Admitting: Internal Medicine

## 2022-05-04 ENCOUNTER — Encounter: Payer: Self-pay | Admitting: Radiology

## 2022-05-05 ENCOUNTER — Telehealth: Payer: Medicare Other | Admitting: Internal Medicine

## 2022-05-05 ENCOUNTER — Ambulatory Visit: Payer: Medicare Other | Attending: Internal Medicine | Admitting: Internal Medicine

## 2022-05-05 ENCOUNTER — Encounter: Payer: Self-pay | Admitting: Internal Medicine

## 2022-05-05 ENCOUNTER — Other Ambulatory Visit: Payer: Self-pay

## 2022-05-05 VITALS — BP 110/60 | HR 56 | Ht 63.0 in | Wt 145.4 lb

## 2022-05-05 DIAGNOSIS — I5021 Acute systolic (congestive) heart failure: Secondary | ICD-10-CM

## 2022-05-05 DIAGNOSIS — N1832 Chronic kidney disease, stage 3b: Secondary | ICD-10-CM | POA: Diagnosis not present

## 2022-05-05 DIAGNOSIS — I5023 Acute on chronic systolic (congestive) heart failure: Secondary | ICD-10-CM

## 2022-05-05 DIAGNOSIS — E876 Hypokalemia: Secondary | ICD-10-CM | POA: Diagnosis not present

## 2022-05-05 DIAGNOSIS — G4733 Obstructive sleep apnea (adult) (pediatric): Secondary | ICD-10-CM | POA: Diagnosis not present

## 2022-05-05 DIAGNOSIS — F419 Anxiety disorder, unspecified: Secondary | ICD-10-CM | POA: Diagnosis not present

## 2022-05-05 DIAGNOSIS — I428 Other cardiomyopathies: Secondary | ICD-10-CM | POA: Diagnosis not present

## 2022-05-05 DIAGNOSIS — J449 Chronic obstructive pulmonary disease, unspecified: Secondary | ICD-10-CM | POA: Diagnosis not present

## 2022-05-05 DIAGNOSIS — I130 Hypertensive heart and chronic kidney disease with heart failure and stage 1 through stage 4 chronic kidney disease, or unspecified chronic kidney disease: Secondary | ICD-10-CM | POA: Diagnosis not present

## 2022-05-05 DIAGNOSIS — I5082 Biventricular heart failure: Secondary | ICD-10-CM | POA: Diagnosis not present

## 2022-05-05 DIAGNOSIS — F32A Depression, unspecified: Secondary | ICD-10-CM | POA: Diagnosis not present

## 2022-05-05 DIAGNOSIS — I2510 Atherosclerotic heart disease of native coronary artery without angina pectoris: Secondary | ICD-10-CM | POA: Diagnosis not present

## 2022-05-05 DIAGNOSIS — I272 Pulmonary hypertension, unspecified: Secondary | ICD-10-CM | POA: Diagnosis not present

## 2022-05-05 DIAGNOSIS — I051 Rheumatic mitral insufficiency: Secondary | ICD-10-CM | POA: Diagnosis not present

## 2022-05-05 DIAGNOSIS — I447 Left bundle-branch block, unspecified: Secondary | ICD-10-CM | POA: Diagnosis not present

## 2022-05-05 DIAGNOSIS — I10 Essential (primary) hypertension: Secondary | ICD-10-CM

## 2022-05-05 DIAGNOSIS — M1990 Unspecified osteoarthritis, unspecified site: Secondary | ICD-10-CM | POA: Diagnosis not present

## 2022-05-05 DIAGNOSIS — M1030 Gout due to renal impairment, unspecified site: Secondary | ICD-10-CM | POA: Diagnosis not present

## 2022-05-05 MED ORDER — ISOSORBIDE DINITRATE 5 MG PO TABS: 5.0000 mg | ORAL_TABLET | Freq: Two times a day (BID) | ORAL | 3 refills | Status: DC

## 2022-05-05 MED ORDER — ISOSORB DINITRATE-HYDRALAZINE 20-37.5 MG PO TABS: 1.0000 | ORAL_TABLET | Freq: Two times a day (BID) | ORAL | 3 refills | Status: DC

## 2022-05-05 MED ORDER — ASPIRIN 81 MG PO CHEW: 81.0000 mg | CHEWABLE_TABLET | Freq: Every day | ORAL | 3 refills | Status: DC

## 2022-05-05 MED ORDER — HYDRALAZINE HCL 25 MG PO TABS: 25.0000 mg | ORAL_TABLET | Freq: Two times a day (BID) | ORAL | 3 refills | Status: DC

## 2022-05-05 MED ORDER — LOSARTAN POTASSIUM 25 MG PO TABS: 25.0000 mg | ORAL_TABLET | Freq: Every day | ORAL | 3 refills | Status: DC

## 2022-05-05 NOTE — Telephone Encounter (Signed)
Pt notified and verbalized understanding.

## 2022-05-05 NOTE — Telephone Encounter (Signed)
Pt c/o medication issue:  1. Name of Medication: Isosorbide- Hydralazine and  Losartan  2. How are you currently taking this medication (dosage and times per day)?   3. Are you having a reaction (difficulty breathing--STAT)?   4. What is your medication issue? Medicine was so expensive- she wanted to know if there were patient assistance programs or discount cards for these medicine n

## 2022-05-05 NOTE — Patient Instructions (Signed)
Medication Instructions:  Your physician has recommended you make the following change in your medication:  - Stop Aspirin - Stop Imdur - Start Bidil 20-37.5 mg tablets twice daily - Start Losartan 25 mg tablets once daily - Start Lasix 40 mg tablets twice daily for 3 days- check weight- then lower to 40 mg once daily.  *If you need a refill on your cardiac medications before your next appointment, please call your pharmacy*   Lab Work: None If you have labs (blood work) drawn today and your tests are completely normal, you will receive your results only by: [place] (if you have MyChart) OR A paper copy in the mail If you have any lab test that is abnormal or we need to change your treatment, we will call you to review the results.   Testing/Procedures: None   Follow-Up: At [facility], you and your health needs are our priority.  As part of our continuing mission to provide you with exceptional heart care, we have created designated Provider Care Teams.  These Care Teams include your primary Cardiologist (physician) and Advanced Practice Providers (APPs -  Physician Assistants and Nurse Practitioners) who all work together to provide you with the care you need, when you need it.  We recommend signing up for the patient portal called "MyChart".  Sign up information is provided on this After Visit Summary.  MyChart is used to connect with patients for Virtual Visits (Telemedicine).  Patients are able to view lab/test results, encounter notes, upcoming appointments, etc.  Non-urgent messages can be sent to your provider as well.   To learn more about what you can do with MyChart, go to [email or web address].    Your next appointment:   3 month(s)  Provider:   Claudina Lick, MD    Other Instructions

## 2022-05-05 NOTE — Telephone Encounter (Signed)
Pt stated that she had to pay $140.00 for both medications Bidil and Losartan at the pharmacy. Pt stated that she cannot afford this each month.   Please advise.

## 2022-05-05 NOTE — Progress Notes (Signed)
Cardiology Office Note  Date: 05/05/2022   ID: Heather [name], DOB 06-Apr-1940, MRN [id number]  PCP:  Johnette Abraham, MD  Cardiologist:  Evalina Field, MD Electrophysiologist:  None   Reason for Office Visit: Posthospitalization follow-up visit   History of Present Illness: Heather [name] is a 82 y.o. female known to have NICM with LVEF less than 123456 and RV systolic dysfunction, RVEF 23% with no device, history of CVA, COPD, HTN, OSA presented to cardiology clinic for follow-up visit.  Patient was admitted to [name] in 03/2022 with a diagnosis of acute systolic heart failure exacerbation, stage D and had to be transferred to [facility] for inotropic support. She underwent cardiac MRI which showed severely dilated LV with diffuse hypokinesis and septal -lateral dyssynchrony, EF 16%, moderately dilated RV with EF 23%, mid to apical inferolateral LGE in the pattern of prior infarction (subendocardial) and elevated ECV percentage suggesting increased myocardial fibrosis but does not suggest amyloidosis. She is here for follow-up visit. Denies any SOB, chest pain, dizziness, syncope, lightheadedness and leg swelling. However she reported that she felt little out of breath walking from her car to the office today. Her dry weight usually ranges around 140-141 pounds with office scale showing 142 pounds.  Past Medical History:  Diagnosis Date   Anemia    Anxiety    Arthritis    COPD (chronic obstructive pulmonary disease) (HCC)    CVA (cerebral vascular accident) ([place])    Depression    Dry eyes 04/14/2014   GERD (gastroesophageal reflux disease)    Glaucoma    Gout    HTN (hypertension)    OSA (obstructive sleep apnea)     Past Surgical History:  Procedure Laterality Date   ABDOMINAL HYSTERECTOMY     BACK SURGERY     lumbar-disc   BUNIONECTOMY Bilateral    FOOT SURGERY Left    repair of "kissing Cousins"   JOINT REPLACEMENT Bilateral    hip    JOINT  REPLACEMENT Right 2010 or 2012   knee   KNEE SURGERY     RIGHT/LEFT HEART CATH AND CORONARY ANGIOGRAPHY N/A 10/27/2021   Procedure: RIGHT/LEFT HEART CATH AND CORONARY ANGIOGRAPHY;  Surgeon: Jettie Booze, MD;  Location: [place] [facility];  Service: Cardiovascular;  Laterality: N/A;   SHOULDER ARTHROSCOPY Right    shoulder surgery in [place] about 2000   SHOULDER HEMI-ARTHROPLASTY Left 03/25/2014   Procedure: LEFT SHOULDER HEMI-ARTHROPLASTY;  Surgeon: Carole Civil, MD;  Location: [facility];  Service: Orthopedics;  Laterality: Left;    Current Outpatient Medications  Medication Sig Dispense Refill   acetaminophen (TYLENOL) 500 MG tablet Take 500 mg by mouth every 6 (six) hours as needed.     allopurinol (ZYLOPRIM) 300 MG tablet Take 0.5 tablets (150 mg total) by mouth daily. 45 tablet 1   aspirin 81 MG chewable tablet Chew 1 tablet (81 mg total) by mouth daily. 30 tablet 5   atorvastatin (LIPITOR) 10 MG tablet Take 1 tablet (10 mg total) by mouth daily. 30 tablet 2   bisoprolol (ZEBETA) 5 MG tablet Take 0.5 tablets (2.5 mg total) by mouth daily. 15 tablet 8   chlorpheniramine (CHLOR-TRIMETON) 4 MG tablet Take 4 mg by mouth daily as needed for allergies.     Cholecalciferol (DIALYVITE VITAMIN D 5000 PO) Take by mouth.     dapagliflozin propanediol (FARXIGA) 10 MG TABS tablet Take 1 tablet (10 mg total) by mouth daily. 30 tablet 10  digoxin (LANOXIN) 0.125 MG tablet Take 0.5 tablets (0.0625 mg total) by mouth daily. 30 tablet 5   famotidine (PEPCID) 20 MG tablet Take 1 tablet (20 mg total) by mouth at bedtime. One after supper 30 tablet 3   Fluticasone-Umeclidin-Vilant (TRELEGY ELLIPTA) 100-62.5-25 MCG/ACT AEPB Inhale 1 puff into the lungs daily. 60 each 11   furosemide (LASIX) 40 MG tablet Take 1 tablet (40 mg total) by mouth daily. 30 tablet 5   HYDROcodone-acetaminophen (NORCO/VICODIN) 5-325 MG tablet Take 1 tablet by mouth every 6 (six) hours as needed for moderate pain. 120 tablet  0   isosorbide mononitrate (IMDUR) 30 MG 24 hr tablet Take 30 mg by mouth daily.     ketoconazole (NIZORAL) 2 % cream Apply 1 application topically daily. 60 g 2   losartan (COZAAR) 25 MG tablet Take 1 tablet (25 mg total) by mouth 2 (two) times daily. 60 tablet 5   Multiple Vitamins-Minerals (MULTIVITAMIN WITH MINERALS) tablet Take 1 tablet by mouth daily.     PROAIR HFA 108 (90 Base) MCG/ACT inhaler Inhale 2 puffs into the lungs every 6 (six) hours as needed for wheezing or shortness of breath. 18 g 2   spironolactone (ALDACTONE) 25 MG tablet Take 1 tablet (25 mg total) by mouth daily. 30 tablet 5   venlafaxine XR (EFFEXOR-XR) 37.5 MG 24 hr capsule Take 2 capsules (75 mg total) by mouth See admin instructions. Take 2 capsules by mouth daily 60 capsule 2   zolpidem (AMBIEN) 10 MG tablet TAKE (1) TABLET BY MOUTH AT BEDTIME AS NEEDED FOR SLEEP (Patient taking differently: Take 10 mg by mouth at bedtime as needed for sleep.) 30 tablet 5   No current facility-administered medications for this visit.   Allergies:  Lovastatin and Lisinopril   Social History: The patient  reports that she quit smoking about 37 years ago. Her smoking use included cigarettes. She has a 40.00 pack-year smoking history. She has never used smokeless tobacco. She reports that she does not drink alcohol and does not use drugs.   Family History: The patient's family history includes Aneurysm in her father; Congestive Heart Failure in her niece; Diabetes in her brother; High blood pressure in her mother; Hypertension in her niece; Pancreatitis in her brother.   ROS:  Please see the history of present illness. Otherwise, complete review of systems is positive for none.  All other systems are reviewed and negative.   Physical Exam: VS:  Ht '5\' 3"'$  (1.6 m)   Wt 145 lb 6.4 oz (66 kg)   BMI 25.76 kg/m , BMI Body mass index is 25.76 kg/m.  Wt Readings from Last 3 Encounters:  05/05/22 145 lb 6.4 oz (66 kg)  04/03/22 146 lb  (66.2 kg)  03/31/22 147 lb 3.2 oz (66.8 kg)    General: Patient appears comfortable at rest. HEENT: Conjunctiva and lids normal, oropharynx clear with moist mucosa. Neck: Supple, no elevated JVP or carotid bruits, no thyromegaly. Lungs: Clear to auscultation, nonlabored breathing at rest. Cardiac: Regular rate and rhythm, no S3 or significant systolic murmur, no pericardial rub. Abdomen: Soft, nontender, no hepatomegaly, bowel sounds present, no guarding or rebound. Extremities: No pitting edema, distal pulses 2+. Skin: Warm and dry. Musculoskeletal: No kyphosis. Neuropsychiatric: Alert and oriented x3, affect grossly appropriate.  ECG:  NSR, LBBB QRS 166 msec  Recent Labwork: 10/07/2021: TSH 0.983 03/15/2022: B Natriuretic Peptide >4,500.0 03/20/2022: ALT 26; AST 39; Hemoglobin 12.9; Magnesium 2.0; Platelets 170 03/31/2022: BUN 40; Creatinine, Ser 1.54; Potassium  4.8; Sodium 137     Component Value Date/Time   CHOL 196 12/14/2020 0120   CHOL 193 01/13/2020 1458   TRIG 77 12/14/2020 0120   HDL 89 12/14/2020 0120   HDL 91 01/13/2020 1458   CHOLHDL 2.2 12/14/2020 0120   VLDL 15 12/14/2020 0120   LDLCALC 92 12/14/2020 0120   LDLCALC 85 01/13/2020 1458    Other Studies Reviewed Today:   Assessment and Plan: Patient is a 82 y/o F known to have NICM with LVEF less than 123456 and RV systolic dysfunction, RVEF 23% with no device, history of CVA, COPD, HTN, OSA presented to cardiology clinic for follow-up visit.  # NICM LVEF <20% and RVEF 23% with no device (LBBB with QRS 166 msec), currently mildly decompensated -Increase Lasix from 40 mg once daily to BID x 3 days followed by Lasix 40 mg once daily. -Bisoprolol discontinued upon hospital discharge due to severe biventricular dysfunction -Switch Losartan 25 mg BID to once daily -Start Hydralazine 20 mg BID and ISDN 5 mg BID -Continue Farxiga 10 mg once daily -Continue Digoxin 0.'0625mg'$  once daily per advanced HF (added by advanced heart  failure, serum creatinine 1.4 with GFR in 30s) -EKG showed NSR and LBBB with QRS 166 msec. Follow up with EP in 05/2022 for CRT-D evaluation/candidacy. Patient brought TENS equipment to the office to inquire if it is safe to use in HF. Educated her it is safe to use TENS as long as she does not have a device in place including PPM/ICD/CRT-D. Once she has device implanted, TENS is contraindicated due to potential for electromagnetic interference. -ER precautions for severe SOB provided  # HTN, controlled -Continue above medications  # OSA on CPAP -Continue CPAP therapy  # Hx of CVA -Continue aspirin 81 mg once daily  -Continue atorvastatin 10 mg QHS  I have spent a total of 33 minutes with patient reviewing chart , telemetry, EKGs, labs and examining patient as well as establishing an assessment and plan that was discussed with the patient.  > 50% of time was spent in direct patient care.     Medication Adjustments/Labs and Tests Ordered: Current medicines are reviewed at length with the patient today.  Concerns regarding medicines are outlined above.   Tests Ordered: No orders of the defined types were placed in this encounter.   Medication Changes: No orders of the defined types were placed in this encounter.   Disposition:  Follow up  3 months  Signed, Kenlei Safi Fidel Levy, MD, 05/05/2022 8:27 AM    [facility] [place]. [address], [place], [place] [address]

## 2022-05-08 ENCOUNTER — Telehealth: Payer: Self-pay | Admitting: Internal Medicine

## 2022-05-08 NOTE — Telephone Encounter (Signed)
Patient is calling bout her medication, trying to see if she is suppose to be taking all 3. Please advise

## 2022-05-08 NOTE — Telephone Encounter (Signed)
Returned call to pt. Line busy will try later.

## 2022-05-08 NOTE — Telephone Encounter (Signed)
Left msg on mobile number to call back

## 2022-05-09 DIAGNOSIS — J449 Chronic obstructive pulmonary disease, unspecified: Secondary | ICD-10-CM | POA: Diagnosis not present

## 2022-05-09 DIAGNOSIS — E876 Hypokalemia: Secondary | ICD-10-CM | POA: Diagnosis not present

## 2022-05-09 DIAGNOSIS — F419 Anxiety disorder, unspecified: Secondary | ICD-10-CM | POA: Diagnosis not present

## 2022-05-09 DIAGNOSIS — I5082 Biventricular heart failure: Secondary | ICD-10-CM | POA: Diagnosis not present

## 2022-05-09 DIAGNOSIS — I5023 Acute on chronic systolic (congestive) heart failure: Secondary | ICD-10-CM | POA: Diagnosis not present

## 2022-05-09 DIAGNOSIS — M1030 Gout due to renal impairment, unspecified site: Secondary | ICD-10-CM | POA: Diagnosis not present

## 2022-05-09 DIAGNOSIS — M1990 Unspecified osteoarthritis, unspecified site: Secondary | ICD-10-CM | POA: Diagnosis not present

## 2022-05-09 DIAGNOSIS — I051 Rheumatic mitral insufficiency: Secondary | ICD-10-CM | POA: Diagnosis not present

## 2022-05-09 DIAGNOSIS — I428 Other cardiomyopathies: Secondary | ICD-10-CM | POA: Diagnosis not present

## 2022-05-09 DIAGNOSIS — I447 Left bundle-branch block, unspecified: Secondary | ICD-10-CM | POA: Diagnosis not present

## 2022-05-09 DIAGNOSIS — I272 Pulmonary hypertension, unspecified: Secondary | ICD-10-CM | POA: Diagnosis not present

## 2022-05-09 DIAGNOSIS — I130 Hypertensive heart and chronic kidney disease with heart failure and stage 1 through stage 4 chronic kidney disease, or unspecified chronic kidney disease: Secondary | ICD-10-CM | POA: Diagnosis not present

## 2022-05-09 DIAGNOSIS — G4733 Obstructive sleep apnea (adult) (pediatric): Secondary | ICD-10-CM | POA: Diagnosis not present

## 2022-05-09 DIAGNOSIS — I2510 Atherosclerotic heart disease of native coronary artery without angina pectoris: Secondary | ICD-10-CM | POA: Diagnosis not present

## 2022-05-09 DIAGNOSIS — F32A Depression, unspecified: Secondary | ICD-10-CM | POA: Diagnosis not present

## 2022-05-09 DIAGNOSIS — N1832 Chronic kidney disease, stage 3b: Secondary | ICD-10-CM | POA: Diagnosis not present

## 2022-05-09 NOTE — Telephone Encounter (Signed)
Reviewed meds with pt and informed not to take Bidil. Pt will take Isordil and Hydralazine.

## 2022-05-09 NOTE — Telephone Encounter (Signed)
Patient notified to continue Asa 81 mg tablet once daily. Patient verbalized understanding.

## 2022-05-10 ENCOUNTER — Telehealth: Payer: Self-pay | Admitting: *Deleted

## 2022-05-10 NOTE — Telephone Encounter (Signed)
Urine drug screen for this encounter is consistent for prescribed medication 

## 2022-05-11 ENCOUNTER — Telehealth: Payer: Self-pay | Admitting: Internal Medicine

## 2022-05-11 DIAGNOSIS — I5082 Biventricular heart failure: Secondary | ICD-10-CM | POA: Diagnosis not present

## 2022-05-11 DIAGNOSIS — G4733 Obstructive sleep apnea (adult) (pediatric): Secondary | ICD-10-CM | POA: Diagnosis not present

## 2022-05-11 DIAGNOSIS — J449 Chronic obstructive pulmonary disease, unspecified: Secondary | ICD-10-CM | POA: Diagnosis not present

## 2022-05-11 DIAGNOSIS — I5023 Acute on chronic systolic (congestive) heart failure: Secondary | ICD-10-CM | POA: Diagnosis not present

## 2022-05-11 DIAGNOSIS — I447 Left bundle-branch block, unspecified: Secondary | ICD-10-CM | POA: Diagnosis not present

## 2022-05-11 DIAGNOSIS — I2510 Atherosclerotic heart disease of native coronary artery without angina pectoris: Secondary | ICD-10-CM | POA: Diagnosis not present

## 2022-05-11 DIAGNOSIS — F32A Depression, unspecified: Secondary | ICD-10-CM | POA: Diagnosis not present

## 2022-05-11 DIAGNOSIS — I130 Hypertensive heart and chronic kidney disease with heart failure and stage 1 through stage 4 chronic kidney disease, or unspecified chronic kidney disease: Secondary | ICD-10-CM | POA: Diagnosis not present

## 2022-05-11 DIAGNOSIS — M1030 Gout due to renal impairment, unspecified site: Secondary | ICD-10-CM | POA: Diagnosis not present

## 2022-05-11 DIAGNOSIS — F419 Anxiety disorder, unspecified: Secondary | ICD-10-CM | POA: Diagnosis not present

## 2022-05-11 DIAGNOSIS — E876 Hypokalemia: Secondary | ICD-10-CM | POA: Diagnosis not present

## 2022-05-11 DIAGNOSIS — N1832 Chronic kidney disease, stage 3b: Secondary | ICD-10-CM | POA: Diagnosis not present

## 2022-05-11 DIAGNOSIS — I428 Other cardiomyopathies: Secondary | ICD-10-CM | POA: Diagnosis not present

## 2022-05-11 DIAGNOSIS — I051 Rheumatic mitral insufficiency: Secondary | ICD-10-CM | POA: Diagnosis not present

## 2022-05-11 DIAGNOSIS — M1990 Unspecified osteoarthritis, unspecified site: Secondary | ICD-10-CM | POA: Diagnosis not present

## 2022-05-11 DIAGNOSIS — I272 Pulmonary hypertension, unspecified: Secondary | ICD-10-CM | POA: Diagnosis not present

## 2022-05-11 NOTE — Progress Notes (Addendum)
ADVANCED HF CLINIC NOTE  Primary Care: Johnette Abraham, MD Primary Cardiologist: Dr. Audie Box HF Cardiologist: Dr. Aundra Dubin  HPI: 82 y.o. [name] w/ h/o CKD IIIb, LBBB, R cerebellar CVA and chronic biventricular heart failure, first diagnosed 10/2021. Echo then showed severely reduced LVEF 15-20%, RV mildly reduced, mild-mod MR. Echo also showed abnormal paradoxical septal motion c/w LBBB. Of note, prior echo in 2017 showed normal LVEF w/ septal dyssynchrony and normal RV.   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 GDMT regimen included Farxiga, Imdur, Bisoprolol. Diuretic regimen Lasix 20 mg daily.    Echo 12/23 showed EF still severely reduced < 15%, progression of RV dysfunction (mod reduced) and severely elevated PA pressure w/ estimated RVSP 68 mmHg.     Admitted 1/24 w/ a/c CHF w/ volume overload. Started on lasix gtt, and placed on empiric milrinone today to argument diureses. GDMT held w/ soft BP. cMRI showed LVEF 16%, moderately dilated RV with RVEF 23%. Milrinone was weaned and GDMT titrated. She was discharged home with HH, weight 150 lbs.  Today she returns for HF follow up with her son. Overall feeling fine. "She has good days and bad days", on bad days she feels tired. She has SOB walking further distances on flat ground. She had SOB while walking in [facility], resolved after using her inhaler. Feels occasional palpitations. Denies CP, dizziness, edema, or PND/Orthopnea. Appetite ok. No fever or chills. Weight at home 139 pounds. Taking all medications. Now doing HH PT 2x/week. Was a heavy drinker > 20 years ago, no ETOH currently or tobacco/drug use. Stopped wearing CPAP, machine too loud.  ECG (personally reviewed): none ordered today.  Labs (1/24): K 4.0, creatinine 1.33, hgb 12.9   Cardiac Studies  - Cardiac MRI (1/24):  LVEF 16%, RVEF 23%, mid-apical inferolateral LGE in prior infarction pattern.   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     allopurinol (ZYLOPRIM) 300 MG tablet Take 0.5 tablets (150 mg total) by mouth daily. 45 tablet 1   aspirin (ASPIRIN CHILDRENS) 81 MG chewable tablet Chew 1 tablet (81 mg total) by mouth daily. 90 tablet 3   atorvastatin (LIPITOR) 10 MG tablet Take 1 tablet (10 mg total) by mouth daily. 30 tablet 2   chlorpheniramine (CHLOR-TRIMETON) 4 MG tablet Take 4 mg by mouth daily as needed for allergies.     Cholecalciferol (DIALYVITE VITAMIN D 5000 PO) Take by mouth daily.     dapagliflozin propanediol (FARXIGA) 10 MG TABS tablet Take 1 tablet (10 mg total) by mouth daily. [place]  tablet 10   digoxin (LANOXIN) 0.125 MG tablet Take 0.5 tablets (0.0625 mg total) by mouth daily. 30 tablet 5   famotidine (PEPCID) 20 MG tablet Take 1 tablet (20 mg total) by mouth at bedtime. One after supper 30 tablet 3   Fluticasone-Umeclidin-Vilant (TRELEGY ELLIPTA) 100-62.5-25 MCG/ACT AEPB Inhale 1 puff into the lungs daily. 60 each 11   furosemide (LASIX) 40 MG tablet Take 1 tablet (40 mg total) by mouth daily. 30 tablet 5   hydrALAZINE (APRESOLINE) 25 MG tablet Take 1 tablet (25 mg total) by mouth in the  morning and at bedtime. 180 tablet 3   HYDROcodone-acetaminophen (NORCO/VICODIN) 5-325 MG tablet Take 1 tablet by mouth every 6 (six) hours as needed for moderate pain. 120 tablet 0   isosorbide dinitrate (ISORDIL) 5 MG tablet Take 1 tablet (5 mg total) by mouth in the morning and at bedtime. 180 tablet 3   ketoconazole (NIZORAL) 2 % cream Apply 1 application topically daily. (Patient taking differently: Apply 1 application  topically daily as needed.) 60 g 2   losartan (COZAAR) 25 MG tablet Take 1 tablet (25 mg total) by mouth daily. 90 tablet 3   Multiple Vitamins-Minerals (MULTIVITAMIN WITH MINERALS) tablet Take 1 tablet by mouth daily.     PROAIR HFA 108 (90 Base) MCG/ACT inhaler Inhale 2 puffs into the lungs every 6 (six) hours as needed for wheezing or shortness of breath. 18 g 2   spironolactone (ALDACTONE) 25 MG tablet Take 1 tablet (25 mg total) by mouth daily. 30 tablet 5   venlafaxine XR (EFFEXOR-XR) 37.5 MG 24 hr capsule Take 2 capsules (75 mg total) by mouth See admin instructions. Take 2 capsules by mouth daily 60 capsule 2   zolpidem (AMBIEN) 10 MG tablet TAKE (1) TABLET BY MOUTH AT BEDTIME AS NEEDED FOR SLEEP 30 tablet 5   No current facility-administered medications for this encounter.   Allergies  Allergen Reactions   Lovastatin Swelling    Patient states that her tongue swells and she gets a tingling feeling all over. Patient states that her tongue swells and she gets a tingling feeling all over.   Lisinopril Swelling    Tongue swelling   Social History   Socioeconomic History   Marital status: Divorced    Spouse name: Not on file   Number of children: Not on file   Years of education: Not on file   Highest education level: Not on file  Occupational History   Not on file  Tobacco Use   Smoking status: Former    Packs/day: 1.00    Years: 40.00    Total pack years: 40.00    Types: Cigarettes    Quit date: 03/20/1985    Years since quitting: 37.1   Smokeless  tobacco: Never  Vaping Use   Vaping Use: Never used  Substance and Sexual Activity   Alcohol use: No   Drug use: No   Sexual activity: Yes    Birth control/protection: Surgical  Other Topics Concern   Not on file  Social History Narrative   Mrs Galli is a 82 year old divorced female who lives alone. She has support of her son and sister.    Social Determinants of Health   Financial Resource Strain: High Risk (11/08/2021)   Overall Financial Resource Strain (CARDIA)    Difficulty of Paying Living Expenses: Very hard  Food Insecurity: No Food Insecurity (03/13/2022)   Hunger Vital Sign    Worried About Running Out of Food  in the Last Year: Never true    [place] in the Last Year: Never true  Transportation Needs: Unmet Transportation Needs (03/15/2022)   PRAPARE - Transportation    Lack of Transportation (Medical): No    Lack of Transportation (Non-Medical): Yes  Physical Activity: Sufficiently Active (11/08/2021)   Exercise Vital Sign    Days of Exercise per Week: 7 days    Minutes of Exercise per Session: 40 min  Stress: No Stress Concern Present (11/08/2021)   [place]    Feeling of Stress : Not at all  Social Connections: Moderately Isolated (11/08/2021)   Social Connection and Isolation Panel [NHANES]    Frequency of Communication with Friends and Family: Twice a week    Frequency of Social Gatherings with Friends and Family: Once a week    Attends Religious Services: More than 4 times per year    Active Member of [employer] or Organizations: No    Attends Archivist Meetings: Never    Marital Status: Widowed  Intimate Partner Violence: Not At Risk (03/13/2022)   Humiliation, Afraid, Rape, and Kick questionnaire    Fear of Current or Ex-Partner: No    Emotionally Abused: No    Physically Abused: No    Sexually Abused: No   Family History  Problem Relation Age of Onset   High blood pressure Mother     Aneurysm Father    Diabetes Brother    Pancreatitis Brother    Hypertension Niece    Congestive Heart Failure Niece    Wt Readings from Last 3 Encounters:  05/12/22 65.6 kg (144 lb 9.6 oz)  05/05/22 66 kg (145 lb 6.4 oz)  04/03/22 66.2 kg (146 lb)   BP (!) 102/56   Pulse 72   Wt 65.6 kg (144 lb 9.6 oz)   SpO2 100%   BMI 25.61 kg/m   PHYSICAL EXAM: General:  NAD. No resp difficulty, arrived in [facility] HEENT: Normal Neck: Supple. No JVD. Carotids 2+ bilat; no bruits. No lymphadenopathy or thryomegaly appreciated. Cor: PMI nondisplaced. Regular rate & rhythm. No rubs, gallops or murmurs. Lungs: Clear Abdomen: Soft, nontender, nondistended. No hepatosplenomegaly. No bruits or masses. Good bowel sounds. Extremities: No cyanosis, clubbing, rash, trace pedal edema Neuro: Alert & oriented x 3, cranial nerves grossly intact. Moves all 4 extremities w/o difficulty. Affect pleasant.  ASSESSMENT & PLAN: 1. Chronic systolic CHF: Nonischemic cardiomyopathy. Cath in 8/23 with no significant CAD, CI 2.24. ?LBBB CMP versus familial versus prior myocarditis.  Last echo in 12/23 with EF < 20%, moderately reduced RV function, D-shaped septum, mild-moderate MR. She was admitted 1/24 volume overload, empirically started on milrinone 0.25 and Lasix gtt.  Cardiac MRI showed LV severely dilated with EF 16% and septal-lateral dyssynchrony, RV moderately dilated with EF 23%, mid-apical inferolateral LGE in prior infarction pattern. However, CAD does not explain her cardiomyopathy based on recent cath. She has significant peripheral edema, still but suspect venous insufficiency plays a large role here. Today, NYHA II-early III, functional status confounded by COPD. She is not volume overloaded today. - Continue bisoprolol 2.5 mg daily. - Continue Lasix 40 mg daily. BMET and BNP today. - Continue spironolactone 25 mg daily.  - Continue digoxin 0.0625 daily. Dig level 04/11/22 0.4 - Continue Farxiga 10 mg daily. No  GU symptoms. - Continue losartan 25 mg daily (angioedema with lisinopril so no Entresto). - Continue hydralazine 25 mg bid + Isordil 5 mg bid. -  Wide LBBB with dyssynchrony by imaging, she would benefit eventually from CRT => referred to EP, she sees Dr. Caryl Comes 05/24/22. - Will repeat echo in 6-8 weeks.  2. CKD stage 3: Baseline SCr 1.3-1.5. - BMET today. 3. H/o right cerebellar CVA: On ASA 81 and statin.  4. H/o COPD: Prior smoker. No wheezing on exam today.  - Follows with Pulmonary 5. OSA: Encouraged CPAP.   Follow up in 6-8 weeks with Dr. Aundra Dubin + echo.  Allena Katz, FNP-BC 05/12/22

## 2022-05-11 NOTE — Telephone Encounter (Signed)
I did not nee this enconter

## 2022-05-12 ENCOUNTER — Ambulatory Visit (HOSPITAL_COMMUNITY)
Admission: RE | Admit: 2022-05-12 | Discharge: 2022-05-12 | Disposition: A | Discharge status: Home / Self Care | Payer: Medicare Other | Source: Ambulatory Visit | Attending: Family Medicine | Admitting: Family Medicine

## 2022-05-12 ENCOUNTER — Encounter (HOSPITAL_COMMUNITY): Payer: Self-pay

## 2022-05-12 VITALS — BP 102/56 | HR 72 | Wt 144.6 lb

## 2022-05-12 DIAGNOSIS — Z8249 Family history of ischemic heart disease and other diseases of the circulatory system: Secondary | ICD-10-CM | POA: Insufficient documentation

## 2022-05-12 DIAGNOSIS — G4733 Obstructive sleep apnea (adult) (pediatric): Secondary | ICD-10-CM

## 2022-05-12 DIAGNOSIS — I2510 Atherosclerotic heart disease of native coronary artery without angina pectoris: Secondary | ICD-10-CM | POA: Insufficient documentation

## 2022-05-12 DIAGNOSIS — Z8673 Personal history of transient ischemic attack (TIA), and cerebral infarction without residual deficits: Secondary | ICD-10-CM

## 2022-05-12 DIAGNOSIS — I130 Hypertensive heart and chronic kidney disease with heart failure and stage 1 through stage 4 chronic kidney disease, or unspecified chronic kidney disease: Secondary | ICD-10-CM | POA: Insufficient documentation

## 2022-05-12 DIAGNOSIS — Z87891 Personal history of nicotine dependence: Secondary | ICD-10-CM | POA: Insufficient documentation

## 2022-05-12 DIAGNOSIS — N1831 Chronic kidney disease, stage 3a: Secondary | ICD-10-CM | POA: Diagnosis not present

## 2022-05-12 DIAGNOSIS — I447 Left bundle-branch block, unspecified: Secondary | ICD-10-CM | POA: Insufficient documentation

## 2022-05-12 DIAGNOSIS — Z5986 Financial insecurity: Secondary | ICD-10-CM | POA: Diagnosis not present

## 2022-05-12 DIAGNOSIS — I2721 Secondary pulmonary arterial hypertension: Secondary | ICD-10-CM | POA: Insufficient documentation

## 2022-05-12 DIAGNOSIS — Z79899 Other long term (current) drug therapy: Secondary | ICD-10-CM | POA: Insufficient documentation

## 2022-05-12 DIAGNOSIS — N1832 Chronic kidney disease, stage 3b: Secondary | ICD-10-CM | POA: Diagnosis not present

## 2022-05-12 DIAGNOSIS — I5021 Acute systolic (congestive) heart failure: Secondary | ICD-10-CM | POA: Diagnosis not present

## 2022-05-12 DIAGNOSIS — I5022 Chronic systolic (congestive) heart failure: Secondary | ICD-10-CM | POA: Insufficient documentation

## 2022-05-12 DIAGNOSIS — I428 Other cardiomyopathies: Secondary | ICD-10-CM | POA: Diagnosis not present

## 2022-05-12 DIAGNOSIS — J449 Chronic obstructive pulmonary disease, unspecified: Secondary | ICD-10-CM | POA: Diagnosis not present

## 2022-05-12 DIAGNOSIS — Z7984 Long term (current) use of oral hypoglycemic drugs: Secondary | ICD-10-CM | POA: Insufficient documentation

## 2022-05-12 LAB — BASIC METABOLIC PANEL
Anion gap: 11 (ref 5–15)
BUN: 58 mg/dL — ABNORMAL HIGH (ref 8–23)
CO2: 23 mmol/L (ref 22–32)
Calcium: 9.6 mg/dL (ref 8.9–10.3)
Chloride: 107 mmol/L (ref 98–111)
Creatinine, Ser: 1.48 mg/dL — ABNORMAL HIGH (ref 0.44–1.00)
GFR, Estimated: 36 mL/min — ABNORMAL LOW (ref >60)
Glucose, Bld: 105 mg/dL — ABNORMAL HIGH (ref 70–99)
Potassium: 4 mmol/L (ref 3.5–5.1)
Sodium: 141 mmol/L (ref 135–145)

## 2022-05-12 LAB — BRAIN NATRIURETIC PEPTIDE: B Natriuretic Peptide: 3014.6 pg/mL — ABNORMAL HIGH (ref 0.0–100.0)

## 2022-05-12 MED ORDER — FUROSEMIDE 40 MG PO TABS: 40.0000 mg | ORAL_TABLET | Freq: Every day | ORAL | 7 refills | Status: DC

## 2022-05-12 NOTE — Patient Instructions (Addendum)
Thank you for coming in today  Labs were done today, if any labs are abnormal the clinic will call you No news is good news  Medications: No changes   Follow up appointments:  Your physician recommends that you schedule a follow-up appointment in:  6-8 weeks with Dr. Aundra Dubin with echocardiogram   Your physician has requested that you have an echocardiogram. Echocardiography is a painless test that uses sound waves to create images of your heart. It provides your doctor with information about the size and shape of your heart and how well your heart's chambers and valves are working. This procedure takes approximately one hour. There are no restrictions for this procedure.       Do the following things EVERYDAY: Weigh yourself in the morning before breakfast. Write it down and keep it in a log. Take your medicines as prescribed Eat low salt foods--Limit salt (sodium) to 2000 mg per day.  Stay as active as you can everyday Limit all fluids for the day to less than 2 liters   At the [facility], you and your health needs are our priority. As part of our continuing mission to provide you with exceptional heart care, we have created designated Provider Care Teams. These Care Teams include your primary Cardiologist (physician) and Advanced Practice Providers (APPs- Physician Assistants and Nurse Practitioners) who all work together to provide you with the care you need, when you need it.   You may see any of the following providers on your designated Care Team at your next follow up: Dr Glori Bickers Dr Loralie Champagne Dr. Roxana Hires, NP Lyda Jester, [place] [facility] [place], [place] Forestine Na, NP Audry Riles, PharmD   Please be sure to bring in all your medications bottles to every appointment.    Thank you for choosing [facility]  If you have any questions or concerns before your next  appointment please send [place] a message through [place] or call our office at [phone].    TO LEAVE A MESSAGE FOR THE NURSE SELECT OPTION 2, PLEASE LEAVE A MESSAGE INCLUDING: YOUR NAME DATE OF BIRTH CALL BACK NUMBER REASON FOR CALL**this is important as we prioritize the call backs  YOU WILL RECEIVE A CALL BACK THE SAME DAY AS LONG AS YOU CALL BEFORE 4:00 PM

## 2022-05-15 ENCOUNTER — Telehealth: Payer: Self-pay | Admitting: Internal Medicine

## 2022-05-15 ENCOUNTER — Other Ambulatory Visit (HOSPITAL_COMMUNITY): Payer: Self-pay

## 2022-05-15 DIAGNOSIS — G4733 Obstructive sleep apnea (adult) (pediatric): Secondary | ICD-10-CM | POA: Diagnosis not present

## 2022-05-15 DIAGNOSIS — I272 Pulmonary hypertension, unspecified: Secondary | ICD-10-CM | POA: Diagnosis not present

## 2022-05-15 DIAGNOSIS — I5082 Biventricular heart failure: Secondary | ICD-10-CM | POA: Diagnosis not present

## 2022-05-15 DIAGNOSIS — F32A Depression, unspecified: Secondary | ICD-10-CM | POA: Diagnosis not present

## 2022-05-15 DIAGNOSIS — I447 Left bundle-branch block, unspecified: Secondary | ICD-10-CM | POA: Diagnosis not present

## 2022-05-15 DIAGNOSIS — I428 Other cardiomyopathies: Secondary | ICD-10-CM | POA: Diagnosis not present

## 2022-05-15 DIAGNOSIS — I130 Hypertensive heart and chronic kidney disease with heart failure and stage 1 through stage 4 chronic kidney disease, or unspecified chronic kidney disease: Secondary | ICD-10-CM | POA: Diagnosis not present

## 2022-05-15 DIAGNOSIS — J449 Chronic obstructive pulmonary disease, unspecified: Secondary | ICD-10-CM | POA: Diagnosis not present

## 2022-05-15 DIAGNOSIS — M1030 Gout due to renal impairment, unspecified site: Secondary | ICD-10-CM | POA: Diagnosis not present

## 2022-05-15 DIAGNOSIS — I5023 Acute on chronic systolic (congestive) heart failure: Secondary | ICD-10-CM | POA: Diagnosis not present

## 2022-05-15 DIAGNOSIS — F419 Anxiety disorder, unspecified: Secondary | ICD-10-CM | POA: Diagnosis not present

## 2022-05-15 DIAGNOSIS — I2510 Atherosclerotic heart disease of native coronary artery without angina pectoris: Secondary | ICD-10-CM | POA: Diagnosis not present

## 2022-05-15 DIAGNOSIS — M1990 Unspecified osteoarthritis, unspecified site: Secondary | ICD-10-CM | POA: Diagnosis not present

## 2022-05-15 DIAGNOSIS — E876 Hypokalemia: Secondary | ICD-10-CM | POA: Diagnosis not present

## 2022-05-15 DIAGNOSIS — I051 Rheumatic mitral insufficiency: Secondary | ICD-10-CM | POA: Diagnosis not present

## 2022-05-15 DIAGNOSIS — N1832 Chronic kidney disease, stage 3b: Secondary | ICD-10-CM | POA: Diagnosis not present

## 2022-05-15 NOTE — Telephone Encounter (Signed)
Marcella Dubs, physical therapist, w. [facility]  Called in for verbal orders . Physical therapy re certification this week.  [phone]

## 2022-05-15 NOTE — Telephone Encounter (Signed)
Gave verbal orders. 

## 2022-05-16 ENCOUNTER — Telehealth (HOSPITAL_COMMUNITY): Payer: Self-pay | Admitting: *Deleted

## 2022-05-16 DIAGNOSIS — I5021 Acute systolic (congestive) heart failure: Secondary | ICD-10-CM

## 2022-05-16 DIAGNOSIS — Z79899 Other long term (current) drug therapy: Secondary | ICD-10-CM

## 2022-05-16 MED ORDER — FUROSEMIDE 40 MG PO TABS: ORAL_TABLET | ORAL | 3 refills | Status: DC

## 2022-05-16 NOTE — Telephone Encounter (Signed)
Called patient per Allena Katz, NP with following lab results and instructions:  "BNP better than previous, but remains elevated. Increase Lasix to 60 mg daily to see if this will help with her dyspnea.  Repeat BMET in 10 days"  Pt verbalized understanding of above. Updated Rx sent to local pharmacy. Repeat BMP scheduled and order placed.

## 2022-05-18 ENCOUNTER — Ambulatory Visit: Payer: Medicare HMO | Admitting: Internal Medicine

## 2022-05-18 ENCOUNTER — Encounter: Payer: Self-pay | Admitting: Internal Medicine

## 2022-05-20 DIAGNOSIS — M1030 Gout due to renal impairment, unspecified site: Secondary | ICD-10-CM | POA: Diagnosis not present

## 2022-05-20 DIAGNOSIS — J449 Chronic obstructive pulmonary disease, unspecified: Secondary | ICD-10-CM | POA: Diagnosis not present

## 2022-05-20 DIAGNOSIS — N1832 Chronic kidney disease, stage 3b: Secondary | ICD-10-CM | POA: Diagnosis not present

## 2022-05-20 DIAGNOSIS — F419 Anxiety disorder, unspecified: Secondary | ICD-10-CM | POA: Diagnosis not present

## 2022-05-20 DIAGNOSIS — I447 Left bundle-branch block, unspecified: Secondary | ICD-10-CM | POA: Diagnosis not present

## 2022-05-20 DIAGNOSIS — I428 Other cardiomyopathies: Secondary | ICD-10-CM | POA: Diagnosis not present

## 2022-05-20 DIAGNOSIS — I5082 Biventricular heart failure: Secondary | ICD-10-CM | POA: Diagnosis not present

## 2022-05-20 DIAGNOSIS — G4733 Obstructive sleep apnea (adult) (pediatric): Secondary | ICD-10-CM | POA: Diagnosis not present

## 2022-05-20 DIAGNOSIS — E785 Hyperlipidemia, unspecified: Secondary | ICD-10-CM | POA: Diagnosis not present

## 2022-05-20 DIAGNOSIS — E876 Hypokalemia: Secondary | ICD-10-CM | POA: Diagnosis not present

## 2022-05-20 DIAGNOSIS — I130 Hypertensive heart and chronic kidney disease with heart failure and stage 1 through stage 4 chronic kidney disease, or unspecified chronic kidney disease: Secondary | ICD-10-CM | POA: Diagnosis not present

## 2022-05-20 DIAGNOSIS — I2510 Atherosclerotic heart disease of native coronary artery without angina pectoris: Secondary | ICD-10-CM | POA: Diagnosis not present

## 2022-05-20 DIAGNOSIS — I272 Pulmonary hypertension, unspecified: Secondary | ICD-10-CM | POA: Diagnosis not present

## 2022-05-20 DIAGNOSIS — I051 Rheumatic mitral insufficiency: Secondary | ICD-10-CM | POA: Diagnosis not present

## 2022-05-20 DIAGNOSIS — M1990 Unspecified osteoarthritis, unspecified site: Secondary | ICD-10-CM | POA: Diagnosis not present

## 2022-05-20 DIAGNOSIS — I5023 Acute on chronic systolic (congestive) heart failure: Secondary | ICD-10-CM | POA: Diagnosis not present

## 2022-05-23 ENCOUNTER — Telehealth: Payer: Self-pay | Admitting: Internal Medicine

## 2022-05-23 DIAGNOSIS — I051 Rheumatic mitral insufficiency: Secondary | ICD-10-CM | POA: Diagnosis not present

## 2022-05-23 DIAGNOSIS — I2510 Atherosclerotic heart disease of native coronary artery without angina pectoris: Secondary | ICD-10-CM | POA: Diagnosis not present

## 2022-05-23 DIAGNOSIS — E876 Hypokalemia: Secondary | ICD-10-CM | POA: Diagnosis not present

## 2022-05-23 DIAGNOSIS — I447 Left bundle-branch block, unspecified: Secondary | ICD-10-CM | POA: Diagnosis not present

## 2022-05-23 DIAGNOSIS — I428 Other cardiomyopathies: Secondary | ICD-10-CM | POA: Diagnosis not present

## 2022-05-23 DIAGNOSIS — I130 Hypertensive heart and chronic kidney disease with heart failure and stage 1 through stage 4 chronic kidney disease, or unspecified chronic kidney disease: Secondary | ICD-10-CM | POA: Diagnosis not present

## 2022-05-23 DIAGNOSIS — M1030 Gout due to renal impairment, unspecified site: Secondary | ICD-10-CM | POA: Diagnosis not present

## 2022-05-23 DIAGNOSIS — G4733 Obstructive sleep apnea (adult) (pediatric): Secondary | ICD-10-CM | POA: Diagnosis not present

## 2022-05-23 DIAGNOSIS — F419 Anxiety disorder, unspecified: Secondary | ICD-10-CM | POA: Diagnosis not present

## 2022-05-23 DIAGNOSIS — N1832 Chronic kidney disease, stage 3b: Secondary | ICD-10-CM | POA: Diagnosis not present

## 2022-05-23 DIAGNOSIS — I272 Pulmonary hypertension, unspecified: Secondary | ICD-10-CM | POA: Diagnosis not present

## 2022-05-23 DIAGNOSIS — I5023 Acute on chronic systolic (congestive) heart failure: Secondary | ICD-10-CM | POA: Diagnosis not present

## 2022-05-23 DIAGNOSIS — I5082 Biventricular heart failure: Secondary | ICD-10-CM | POA: Diagnosis not present

## 2022-05-23 DIAGNOSIS — M1990 Unspecified osteoarthritis, unspecified site: Secondary | ICD-10-CM | POA: Diagnosis not present

## 2022-05-23 DIAGNOSIS — J449 Chronic obstructive pulmonary disease, unspecified: Secondary | ICD-10-CM | POA: Diagnosis not present

## 2022-05-23 DIAGNOSIS — E785 Hyperlipidemia, unspecified: Secondary | ICD-10-CM | POA: Diagnosis not present

## 2022-05-23 NOTE — Telephone Encounter (Signed)
Sassana w. [facility] home [facility] called in on patient behalf with [place] in regard to zolpidem (AMBIEN) 10 MG tablet [id number]   Patient states that med is waking her up in the night making her do strange things.  Patient has stopped taking med.  Call back info [phone] Bobbye Riggs)

## 2022-05-24 ENCOUNTER — Ambulatory Visit: Payer: Medicare Other | Attending: Internal Medicine | Admitting: Internal Medicine

## 2022-05-24 ENCOUNTER — Encounter: Payer: Self-pay | Admitting: Internal Medicine

## 2022-05-24 ENCOUNTER — Other Ambulatory Visit (HOSPITAL_COMMUNITY): Payer: Self-pay | Admitting: Cardiology

## 2022-05-24 VITALS — BP 100/58 | HR 83 | Ht 62.5 in | Wt 130.6 lb

## 2022-05-24 DIAGNOSIS — I5022 Chronic systolic (congestive) heart failure: Secondary | ICD-10-CM | POA: Diagnosis not present

## 2022-05-24 DIAGNOSIS — Z01812 Encounter for preprocedural laboratory examination: Secondary | ICD-10-CM | POA: Diagnosis not present

## 2022-05-24 DIAGNOSIS — Z79899 Other long term (current) drug therapy: Secondary | ICD-10-CM

## 2022-05-24 DIAGNOSIS — I5021 Acute systolic (congestive) heart failure: Secondary | ICD-10-CM

## 2022-05-24 DIAGNOSIS — I447 Left bundle-branch block, unspecified: Secondary | ICD-10-CM | POA: Diagnosis not present

## 2022-05-24 NOTE — Progress Notes (Signed)
ELECTROPHYSIOLOGY CONSULT NOTE  Patient ID: Heather [name], MRN: [id number], DOB/AGE: 1940/11/12 82 y.o. Admit date: (Not on file) Date of Consult: 05/24/2022  Primary Physician: Johnette Abraham, MD Primary Cardiologist: DM     EUVA MEO is a 82 y.o. female who is being seen today for the evaluation of CRT at the request of CHF  . --DM  HPI Heather [name] is a 82 y.o. female referred for consideration of CRT  Initially presented CHF 8/23 with volume overload and dyspnea.  Evaluation as below demonstrated nonischemic cardiomyopathy abnormal cMRI and has had persistent left ventricular dysfunction She remains significantly limited with dyspnea at less than 200 feet.  3 pillow orthopnea occasional but improved peripheral edema.  Some nocturnal palpitations, mostly irregular and not fast.  Syncope only very remotely.  Longstanding hypertension.  Also carries a diagnosis of COPD and uses her inhalers for dyspnea on exertion  DATE TEST EF   8/23 Echo   15-20 %   8/23 LHC    % Nonobstructive CAD  12/23 Echo  <15%   1/24 cMRI 16% Prior MI        Date Cr K Hgb  3/24 1.48 4.0 12.9            Past Medical History:  Diagnosis Date   Anemia    Anxiety    Arthritis    COPD (chronic obstructive pulmonary disease) (HCC)    CVA (cerebral vascular accident) ([place])    Depression    Dry eyes 04/14/2014   GERD (gastroesophageal reflux disease)    Glaucoma    Gout    HTN (hypertension)    OSA (obstructive sleep apnea)        Surgical History:  Past Surgical History:  Procedure Laterality Date   ABDOMINAL HYSTERECTOMY     BACK SURGERY     lumbar-disc   BUNIONECTOMY Bilateral    FOOT SURGERY Left    repair of "kissing Cousins"   JOINT REPLACEMENT Bilateral    hip    JOINT REPLACEMENT Right 2010 or 2012   knee   KNEE SURGERY     RIGHT/LEFT HEART CATH AND CORONARY ANGIOGRAPHY N/A 10/27/2021   Procedure: RIGHT/LEFT HEART CATH AND CORONARY ANGIOGRAPHY;  Surgeon:  Jettie Booze, MD;  Location: [facility];  Service: Cardiovascular;  Laterality: N/A;   SHOULDER ARTHROSCOPY Right    shoulder surgery in [place] about 2000   SHOULDER HEMI-ARTHROPLASTY Left 03/25/2014   Procedure: LEFT SHOULDER HEMI-ARTHROPLASTY;  Surgeon: Carole Civil, MD;  Location: [facility];  Service: Orthopedics;  Laterality: Left;     Home Meds: Current Meds  Medication Sig   acetaminophen (TYLENOL) 500 MG tablet Take 500 mg by mouth every 6 (six) hours as needed.   allopurinol (ZYLOPRIM) 300 MG tablet Take 0.5 tablets (150 mg total) by mouth daily.   aspirin (ASPIRIN CHILDRENS) 81 MG chewable tablet Chew 1 tablet (81 mg total) by mouth daily.   atorvastatin (LIPITOR) 10 MG tablet Take 1 tablet (10 mg total) by mouth daily.   bisoprolol (ZEBETA) 5 MG tablet Take 2.5 mg by mouth daily.   chlorpheniramine (CHLOR-TRIMETON) 4 MG tablet Take 4 mg by mouth daily as needed for allergies.   Cholecalciferol (DIALYVITE VITAMIN D 5000 PO) Take by mouth daily.   dapagliflozin propanediol (FARXIGA) 10 MG TABS tablet Take 1 tablet (10 mg total) by mouth daily.   digoxin (LANOXIN) 0.125 MG tablet Take 0.5 tablets (0.0625 mg  total) by mouth daily.   famotidine (PEPCID) 20 MG tablet Take 1 tablet (20 mg total) by mouth at bedtime. One after supper   Fluticasone-Umeclidin-Vilant (TRELEGY ELLIPTA) 100-62.5-25 MCG/ACT AEPB Inhale 1 puff into the lungs daily.   furosemide (LASIX) 40 MG tablet Take 60 meq daily   hydrALAZINE (APRESOLINE) 25 MG tablet Take 1 tablet (25 mg total) by mouth in the morning and at bedtime.   HYDROcodone-acetaminophen (NORCO/VICODIN) 5-325 MG tablet Take 1 tablet by mouth every 6 (six) hours as needed for moderate pain.   isosorbide dinitrate (ISORDIL) 5 MG tablet Take 1 tablet (5 mg total) by mouth in the morning and at bedtime.   ketoconazole (NIZORAL) 2 % cream Apply 1 application topically daily. (Patient taking differently: Apply 1 application  topically  daily as needed.)   losartan (COZAAR) 25 MG tablet Take 1 tablet (25 mg total) by mouth daily.   Multiple Vitamins-Minerals (MULTIVITAMIN WITH MINERALS) tablet Take 1 tablet by mouth daily.   PROAIR HFA 108 (90 Base) MCG/ACT inhaler Inhale 2 puffs into the lungs every 6 (six) hours as needed for wheezing or shortness of breath.   spironolactone (ALDACTONE) 25 MG tablet Take 1 tablet (25 mg total) by mouth daily.   venlafaxine XR (EFFEXOR-XR) 37.5 MG 24 hr capsule Take 2 capsules (75 mg total) by mouth See admin instructions. Take 2 capsules by mouth daily   zolpidem (AMBIEN) 10 MG tablet TAKE (1) TABLET BY MOUTH AT BEDTIME AS NEEDED FOR SLEEP    Allergies:  Allergies  Allergen Reactions   Lovastatin Swelling    Patient states that her tongue swells and she gets a tingling feeling all over. Patient states that her tongue swells and she gets a tingling feeling all over.   Lisinopril Swelling    Tongue swelling    Social History   Socioeconomic History   Marital status: Divorced    Spouse name: Not on file   Number of children: Not on file   Years of education: Not on file   Highest education level: Not on file  Occupational History   Not on file  Tobacco Use   Smoking status: Former    Packs/day: 1.00    Years: 40.00    Additional pack years: 0.00    Total pack years: 40.00    Types: Cigarettes    Quit date: 03/20/1985    Years since quitting: 37.2   Smokeless tobacco: Never  Vaping Use   Vaping Use: Never used  Substance and Sexual Activity   Alcohol use: No   Drug use: No   Sexual activity: Yes    Birth control/protection: Surgical  Other Topics Concern   Not on file  Social History Narrative   Heather [name] is a 82 year old divorced female who lives alone. She has support of her son and sister.    Social Determinants of Health   Financial Resource Strain: High Risk (11/08/2021)   Overall Financial Resource Strain (CARDIA)    Difficulty of Paying Living Expenses: Very  hard  Food Insecurity: No Food Insecurity (03/13/2022)   Hunger Vital Sign    Worried About Running Out of Food in the Last Year: Never true    Ran Out of Food in the Last Year: Never true  Transportation Needs: Unmet Transportation Needs (03/15/2022)   PRAPARE - Transportation    Lack of Transportation (Medical): No    Lack of Transportation (Non-Medical): Yes  Physical Activity: Sufficiently Active (11/08/2021)   Exercise Vital Sign  Days of Exercise per Week: 7 days    Minutes of Exercise per Session: 40 min  Stress: No Stress Concern Present (11/08/2021)   [place]    Feeling of Stress : Not at all  Social Connections: Moderately Isolated (11/08/2021)   Social Connection and Isolation Panel [NHANES]    Frequency of Communication with Friends and Family: Twice a week    Frequency of Social Gatherings with Friends and Family: Once a week    Attends Religious Services: More than 4 times per year    Active Member of [employer] or Organizations: No    Attends Archivist Meetings: Never    Marital Status: Widowed  Intimate Partner Violence: Not At Risk (03/13/2022)   Humiliation, Afraid, Rape, and Kick questionnaire    Fear of Current or Ex-Partner: No    Emotionally Abused: No    Physically Abused: No    Sexually Abused: No     Family History  Problem Relation Age of Onset   High blood pressure Mother    Aneurysm Father    Diabetes Brother    Pancreatitis Brother    Hypertension Niece    Congestive Heart Failure Niece      ROS:  Please see the history of present illness.     All other systems reviewed and negative.    Physical Exam: Blood pressure (!) 100/58, pulse 83, height 5' 2.5" (1.588 m), weight 130 lb 9.6 oz (59.2 kg), SpO2 99 %. General: Well developed, well nourished female in no acute distress. Head: Normocephalic, atraumatic, sclera non-icteric, no xanthomas, nares are without discharge. EENT:  normal  Lymph Nodes:  none Neck: Negative for carotid bruits. JVD 6 cm with positive HJR back:without scoliosis kyphosis  Lungs: Clear bilaterally to auscultation without wheezes, rales, or rhonchi. Breathing is unlabored. Heart: RRR with S1 S2. No   murmur . No rubs, or gallops appreciated. Abdomen: Soft, non-tender, non-distended with normoactive bowel sounds. No hepatomegaly. No rebound/guarding. No obvious abdominal masses. Msk:  Strength and tone appear normal for age. Extremities: No clubbing or cyanosis. 1+ edema.  Distal pedal pulses are 2+ and equal bilaterally. Skin: Warm and Dry Neuro: Alert and oriented X 3. CN III-XII intact Grossly normal sensory and motor function . Psych:  Responds to questions appropriately with a normal affect.        EKG: Sinus rhythm at 82 Intervals 18/18/43 Monophasic upright QRS lead I low qR in lead aVL and Rs in V6  ECGs 1/24 demonstrated monophasic upright QRS in lead I and aVL.  Significant fractionation is noted across the anterior precordium on all ECGs   Assessment and Plan:  Nonischemic cardiomyopathy  Scarring based on cMRI suggestive of myocardial infarction with nonobstructive disease?  Embolism  Congestive heart failure class III  Left bundle branch block with lateral lead fractionation  Hypertension  COPD  Patient has persistent left ventricular dysfunction despite guideline directed optimal medical therapy uptitration which is limited by hypotension.  She is appropriately considered for CRT-P her age mitigating the benefit of CRT-D in the nonischemic cohort as based on the [place] trial   The benefits and risks were reviewed including but not limited to death,  perforation, infection, lead dislodgement and device malfunction.  The patient understands agrees and is willing to proceed.     Virl Axe review

## 2022-05-24 NOTE — Addendum Note (Signed)
Addended by: Kerry Dory on: 05/24/2022 02:09 PM   Modules accepted: Orders

## 2022-05-24 NOTE — Patient Instructions (Addendum)
Medication Instructions:  Your physician recommends that you continue on your current medications as directed. Please refer to the Current Medication list given to you today.  *If you need a refill on your cardiac medications before your next appointment, please call your pharmacy*   Lab Work: None ordered.  If you have labs (blood work) drawn today and your tests are completely normal, you will receive your results only by: [place] (if you have MyChart) OR A paper copy in the mail If you have any lab test that is abnormal or we need to change your treatment, we will call you to review the results.   Testing/Procedures: Your physician has recommended that you have a pacemaker inserted. A pacemaker is a small device that is placed under the skin of your chest or abdomen to help control abnormal heart rhythms. This device uses electrical pulses to prompt the heart to beat at a normal rate. Pacemakers are used to treat heart rhythms that are too slow. Wire (leads) are attached to the pacemaker that goes into the chambers of you heart. This is done in the hospital and usually requires and overnight stay. Please see the instruction sheet given to you today for more information.  CRT or cardiac resynchronization therapy is a treatment used to correct heart failure. When you have heart failure your heart is weakened and doesn't pump as well as it should. This therapy may help reduce symptoms and improve the quality of life.  Please see the handout/brochure given to you today to get more information of the different options of therapy.     Follow-Up: At [facility], you and your health needs are our priority.  As part of our continuing mission to provide you with exceptional heart care, we have created designated Provider Care Teams.  These Care Teams include your primary Cardiologist (physician) and Advanced Practice Providers (APPs -  Physician Assistants and Nurse Practitioners)  who all work together to provide you with the care you need, when you need it.  We recommend signing up for the patient portal called "MyChart".  Sign up information is provided on this After Visit Summary.  MyChart is used to connect with patients for Virtual Visits (Telemedicine).  Patients are able to view lab/test results, encounter notes, upcoming appointments, etc.  Non-urgent messages can be sent to your provider as well.   To learn more about what you can do with MyChart, go to [email or web address].    Your next appointment:   To be scheduled

## 2022-05-25 DIAGNOSIS — M1030 Gout due to renal impairment, unspecified site: Secondary | ICD-10-CM | POA: Diagnosis not present

## 2022-05-25 DIAGNOSIS — M1990 Unspecified osteoarthritis, unspecified site: Secondary | ICD-10-CM | POA: Diagnosis not present

## 2022-05-25 DIAGNOSIS — F419 Anxiety disorder, unspecified: Secondary | ICD-10-CM | POA: Diagnosis not present

## 2022-05-25 DIAGNOSIS — G4733 Obstructive sleep apnea (adult) (pediatric): Secondary | ICD-10-CM | POA: Diagnosis not present

## 2022-05-25 DIAGNOSIS — I051 Rheumatic mitral insufficiency: Secondary | ICD-10-CM | POA: Diagnosis not present

## 2022-05-25 DIAGNOSIS — J449 Chronic obstructive pulmonary disease, unspecified: Secondary | ICD-10-CM | POA: Diagnosis not present

## 2022-05-25 DIAGNOSIS — I447 Left bundle-branch block, unspecified: Secondary | ICD-10-CM | POA: Diagnosis not present

## 2022-05-25 DIAGNOSIS — I130 Hypertensive heart and chronic kidney disease with heart failure and stage 1 through stage 4 chronic kidney disease, or unspecified chronic kidney disease: Secondary | ICD-10-CM | POA: Diagnosis not present

## 2022-05-25 DIAGNOSIS — I2510 Atherosclerotic heart disease of native coronary artery without angina pectoris: Secondary | ICD-10-CM | POA: Diagnosis not present

## 2022-05-25 DIAGNOSIS — I5023 Acute on chronic systolic (congestive) heart failure: Secondary | ICD-10-CM | POA: Diagnosis not present

## 2022-05-25 DIAGNOSIS — N1832 Chronic kidney disease, stage 3b: Secondary | ICD-10-CM | POA: Diagnosis not present

## 2022-05-25 DIAGNOSIS — I428 Other cardiomyopathies: Secondary | ICD-10-CM | POA: Diagnosis not present

## 2022-05-25 DIAGNOSIS — E876 Hypokalemia: Secondary | ICD-10-CM | POA: Diagnosis not present

## 2022-05-25 DIAGNOSIS — I272 Pulmonary hypertension, unspecified: Secondary | ICD-10-CM | POA: Diagnosis not present

## 2022-05-25 DIAGNOSIS — E785 Hyperlipidemia, unspecified: Secondary | ICD-10-CM | POA: Diagnosis not present

## 2022-05-25 DIAGNOSIS — I5082 Biventricular heart failure: Secondary | ICD-10-CM | POA: Diagnosis not present

## 2022-05-26 ENCOUNTER — Encounter: Payer: Self-pay | Admitting: Internal Medicine

## 2022-05-26 ENCOUNTER — Encounter: Payer: Self-pay | Admitting: Physical Medicine & Rehabilitation

## 2022-05-26 ENCOUNTER — Encounter: Payer: Medicare Other | Attending: Physical Medicine & Rehabilitation | Admitting: Physical Medicine & Rehabilitation

## 2022-05-26 ENCOUNTER — Ambulatory Visit (INDEPENDENT_AMBULATORY_CARE_PROVIDER_SITE_OTHER): Payer: Medicare Other | Admitting: Internal Medicine

## 2022-05-26 VITALS — BP 98/60 | HR 78 | Ht 62.5 in | Wt 142.4 lb

## 2022-05-26 VITALS — BP 107/63 | HR 76 | Ht 62.5 in | Wt 138.0 lb

## 2022-05-26 DIAGNOSIS — J449 Chronic obstructive pulmonary disease, unspecified: Secondary | ICD-10-CM

## 2022-05-26 DIAGNOSIS — I5022 Chronic systolic (congestive) heart failure: Secondary | ICD-10-CM

## 2022-05-26 DIAGNOSIS — I5023 Acute on chronic systolic (congestive) heart failure: Secondary | ICD-10-CM

## 2022-05-26 DIAGNOSIS — Z0001 Encounter for general adult medical examination with abnormal findings: Secondary | ICD-10-CM

## 2022-05-26 DIAGNOSIS — F32A Depression, unspecified: Secondary | ICD-10-CM | POA: Diagnosis not present

## 2022-05-26 DIAGNOSIS — I5021 Acute systolic (congestive) heart failure: Secondary | ICD-10-CM

## 2022-05-26 DIAGNOSIS — R7303 Prediabetes: Secondary | ICD-10-CM

## 2022-05-26 DIAGNOSIS — Z5181 Encounter for therapeutic drug level monitoring: Secondary | ICD-10-CM | POA: Insufficient documentation

## 2022-05-26 DIAGNOSIS — Z79891 Long term (current) use of opiate analgesic: Secondary | ICD-10-CM | POA: Diagnosis not present

## 2022-05-26 DIAGNOSIS — G894 Chronic pain syndrome: Secondary | ICD-10-CM | POA: Diagnosis not present

## 2022-05-26 DIAGNOSIS — N1831 Chronic kidney disease, stage 3a: Secondary | ICD-10-CM | POA: Diagnosis not present

## 2022-05-26 DIAGNOSIS — G8929 Other chronic pain: Secondary | ICD-10-CM | POA: Diagnosis not present

## 2022-05-26 DIAGNOSIS — E785 Hyperlipidemia, unspecified: Secondary | ICD-10-CM | POA: Diagnosis not present

## 2022-05-26 DIAGNOSIS — M545 Low back pain, unspecified: Secondary | ICD-10-CM | POA: Insufficient documentation

## 2022-05-26 MED ORDER — HYDROCODONE-ACETAMINOPHEN 5-325 MG PO TABS: 1.0000 | ORAL_TABLET | Freq: Four times a day (QID) | ORAL | 0 refills | Status: DC | PRN

## 2022-05-26 MED ORDER — ATORVASTATIN CALCIUM 10 MG PO TABS: 10.0000 mg | ORAL_TABLET | Freq: Every day | ORAL | 1 refills | Status: DC

## 2022-05-26 NOTE — Progress Notes (Unsigned)
Established Patient Office Visit  Subjective   Patient ID: Heather [name], female    DOB: [birth year]  Age: 82 y.o. MRN: [id number]  Chief Complaint  Patient presents with   Congestive Heart Failure    Follow up   Heather [name] returns to care today for routine follow-up.  Last seen at [facility] on 11/30.  In the interim she was admitted to [facility] 1/8 - 1/15 for acute on chronic HFrEF exacerbation.  Repeat imaging demonstrated severe LV dilation with EF estimated at 16%.  RV dilation with EF 23% appreciated as well.  She has been closely followed by general cardiology, the advanced heart failure clinic, and EP since hospital discharge.  She will undergo CRT-P implantation in early May.  Heather [name] reports feeling fairly well today given her current circumstances.  She has no additional concerns to discuss.  She endorses occasional dyspnea with exertion but is otherwise asymptomatic.  Past Medical History:  Diagnosis Date   Anemia    Anxiety    Arthritis    COPD (chronic obstructive pulmonary disease) (HCC)    CVA (cerebral vascular accident) ([place])    Depression    Dry eyes 04/14/2014   GERD (gastroesophageal reflux disease)    Glaucoma    Gout    HTN (hypertension)    OSA (obstructive sleep apnea)    Past Surgical History:  Procedure Laterality Date   ABDOMINAL HYSTERECTOMY     BACK SURGERY     lumbar-disc   BUNIONECTOMY Bilateral    FOOT SURGERY Left    repair of "kissing Cousins"   JOINT REPLACEMENT Bilateral    hip    JOINT REPLACEMENT Right 2010 or 2012   knee   KNEE SURGERY     RIGHT/LEFT HEART CATH AND CORONARY ANGIOGRAPHY N/A 10/27/2021   Procedure: RIGHT/LEFT HEART CATH AND CORONARY ANGIOGRAPHY;  Surgeon: Jettie Booze, MD;  Location: [place] [facility];  Service: Cardiovascular;  Laterality: N/A;   SHOULDER ARTHROSCOPY Right    shoulder surgery in [place] about 2000   SHOULDER HEMI-ARTHROPLASTY Left 03/25/2014   Procedure: LEFT SHOULDER HEMI-ARTHROPLASTY;   Surgeon: Carole Civil, MD;  Location: [facility];  Service: Orthopedics;  Laterality: Left;   Social History   Tobacco Use   Smoking status: Former    Packs/day: 1.00    Years: 40.00    Additional pack years: 0.00    Total pack years: 40.00    Types: Cigarettes    Quit date: 03/20/1985    Years since quitting: 37.2   Smokeless tobacco: Never  Vaping Use   Vaping Use: Never used  Substance Use Topics   Alcohol use: No   Drug use: No   Family History  Problem Relation Age of Onset   High blood pressure Mother    Aneurysm Father    Diabetes Brother    Pancreatitis Brother    Hypertension Niece    Congestive Heart Failure Niece    Allergies  Allergen Reactions   Lovastatin Swelling    Patient states that her tongue swells and she gets a tingling feeling all over. Patient states that her tongue swells and she gets a tingling feeling all over.   Lisinopril Swelling    Tongue swelling   Review of Systems  Respiratory:  Positive for shortness of breath (Occasional DOE).   Cardiovascular:  Negative for orthopnea, leg swelling and PND.  All other systems reviewed and are negative.     Objective:  BP 98/60   Pulse 78   Ht 5' 2.5" (1.588 m)   Wt 142 lb 6.4 oz (64.6 kg)   SpO2 91%   BMI 25.63 kg/m  BP Readings from Last 3 Encounters:  05/26/22 98/60  05/26/22 107/63  05/24/22 (!) 100/58   Physical Exam Vitals reviewed.  Constitutional:      General: She is not in acute distress.    Appearance: Normal appearance. She is not toxic-appearing.  HENT:     Head: Normocephalic and atraumatic.     Right Ear: External ear normal.     Left Ear: External ear normal.     Nose: Nose normal. No congestion or rhinorrhea.     Mouth/Throat:     Mouth: Mucous membranes are moist.     Pharynx: Oropharynx is clear. No oropharyngeal exudate or posterior oropharyngeal erythema.  Eyes:     General: No scleral icterus.    Extraocular Movements: Extraocular movements intact.      Conjunctiva/sclera: Conjunctivae normal.     Pupils: Pupils are equal, round, and reactive to light.  Cardiovascular:     Rate and Rhythm: Normal rate and regular rhythm.     Pulses: Normal pulses.     Heart sounds: Normal heart sounds. No murmur heard.    No friction rub. No gallop.  Pulmonary:     Effort: Pulmonary effort is normal.     Breath sounds: Normal breath sounds. No wheezing, rhonchi or rales.  Abdominal:     General: Abdomen is flat. Bowel sounds are normal. There is no distension.     Palpations: Abdomen is soft.     Tenderness: There is no abdominal tenderness.  Musculoskeletal:        General: No swelling. Normal range of motion.     Cervical back: Normal range of motion.     Right lower leg: No edema.     Left lower leg: No edema.  Lymphadenopathy:     Cervical: No cervical adenopathy.  Skin:    General: Skin is warm and dry.     Capillary Refill: Capillary refill takes less than 2 seconds.     Coloration: Skin is not jaundiced.  Neurological:     General: No focal deficit present.     Mental Status: She is alert and oriented to person, place, and time.  Psychiatric:        Mood and Affect: Mood normal.        Behavior: Behavior normal.   Last CBC Lab Results  Component Value Date   WBC 5.4 05/26/2022   HGB 12.1 05/26/2022   HCT 37.3 05/26/2022   MCV 95 05/26/2022   MCH 30.8 05/26/2022   RDW 14.7 05/26/2022   PLT 259 [id number]   Last metabolic panel Lab Results  Component Value Date   GLUCOSE 105 (H) 05/30/2022   NA 139 05/30/2022   K 4.4 05/30/2022   CL 100 05/30/2022   CO2 21 05/30/2022   BUN 45 (H) 05/30/2022   CREATININE 1.58 (H) 05/30/2022   GFRNONAA 36 (L) 05/12/2022   CALCIUM 9.5 05/30/2022   PHOS 3.1 03/17/2022   PROT 6.5 05/26/2022   ALBUMIN 3.8 05/26/2022   LABGLOB 2.7 05/26/2022   AGRATIO 1.4 05/26/2022   BILITOT 0.3 05/26/2022   ALKPHOS 85 05/26/2022   AST 20 05/26/2022   ALT 16 05/26/2022   ANIONGAP 11 05/12/2022    Last lipids Lab Results  Component Value Date   CHOL 159 05/26/2022   HDL 65  05/26/2022   LDLCALC 82 05/26/2022   TRIG 58 05/26/2022   CHOLHDL 2.4 05/26/2022   Last hemoglobin A1c Lab Results  Component Value Date   HGBA1C 6.5 (H) 05/26/2022   Last thyroid functions Lab Results  Component Value Date   TSH 1.320 05/26/2022   Last vitamin D Lab Results  Component Value Date   VD25OH 60.7 05/26/2022   Last vitamin B12 and Folate Lab Results  Component Value Date   [id number] 05/26/2022   FOLATE >20.0 05/26/2022    Assessment & Plan:   Problem List Items Addressed This Visit       Chronic systolic HF (heart failure) ([place])    Cardiac MRI completed in January, demonstrating severe LV dilation with EF estimation at 60%.  RV dilation noted as well with EF 23%.  She is followed by the advanced heart failure clinic.  Appears euvolemic on exam today.  She will undergo CRT-P implantation in early May.  Lasix was recently increased to 60 mg daily.  She is additionally prescribed Farxiga 10 mg daily, digoxin 0.0625 mg daily, losartan 25 mg daily, and spironolactone 25 mg daily. -Repeat BMP today given recent medication adjustments. Will forward results to advanced HF team.      COPD GOLD 1  - Primary    Asymptomatic currently.  Her pulmonary exam today is unremarkable.  Followed by pulmonology (Dr. Halford Chessman).  She was prescribed Trelegy, which she reports using once every 2-3 days. -No medication changes today      Stage 3a chronic kidney disease ([place])    Currently prescribed Farxiga 10 mg daily.   -Repeat labs ordered today      Return in about 3 months (around 08/26/2022).   Johnette Abraham, MD

## 2022-05-26 NOTE — Progress Notes (Signed)
Subjective:    Patient ID: Heather [name], female    DOB: 12-28-1940, 82 y.o.   MRN: [id number]  HPI  Heather [name] is a 82 y.o. year old female  who  has a past medical history of Anemia, Anxiety, Arthritis, COPD (chronic obstructive pulmonary disease) ([place]), CVA (cerebral vascular accident) ([place]), Depression, Dry eyes (04/14/2014), GERD (gastroesophageal reflux disease), Glaucoma, Gout, HTN (hypertension), and OSA (obstructive sleep apnea).   They are presenting to PM&R clinic as a new patient for pain management evaluation. They were referred or treatment of chronic pain.  Heather [name] reports she has had back pain for many years.  She had lumbar surgery in 1992 and her pain was controlled until about 2014.  Most of her pain is in her lower back.  Sometimes the pain shoots down her legs on both sides.  Pain has been stable for many years.  She has been on hydrocodone every 6 hours which helps control the pain.  Pain is not particularly changed with activity.  She also has history of right rotator cuff repair.  She has had 2 total hip replacements.       Medications tried: Tylenol- helps slightly  Ibuprofen- stomach issues, can't take NSAIDs Vanlaflaxine - helps her pain Gabapentin- made her itch  Lyrica- not sure if she used this Tramadol- didn't help enough Hydrocodone 5 mg helps the pain.  She uses this about 4 times a day.     Other treatments: PT/OT  - Helped briefly but then had CHF and had to stop exercise and it got worse, she likes riding a bicicyle  ESI didn't help Has not tried TENS unit Facet blocks ? Many years ago Ice and heat helped the pain.   Interval History  05/26/22 Heather [name] is here for follow-up of her chronic lower back pain.  Pain will sometimes shoot down her legs.  Hydrocodone continues to help keep her pain under control.  This allows her to do more around the house and be more active.  She is not having any side effects with the hydrocodone.  Patient reports  she has received her TENS unit, however due to plan for pacemaker she plans to check with cardiology before using it.  Pain Inventory Average Pain 8 Pain Right Now 8 My pain is stabbing and aching  In the last 24 hours, has pain interfered with the following? General activity 8 Relation with others 9 Enjoyment of life 9 What TIME of day is your pain at its worst? morning , daytime, and night Sleep (in general) Fair  Pain is worse with: walking and some activites Pain improves with: rest, heat/ice, and medication Relief from Meds: 10  Family History  Problem Relation Age of Onset   High blood pressure Mother    Aneurysm Father    Diabetes Brother    Pancreatitis Brother    Hypertension Niece    Congestive Heart Failure Niece    Social History   Socioeconomic History   Marital status: Divorced    Spouse name: Not on file   Number of children: Not on file   Years of education: Not on file   Highest education level: Not on file  Occupational History   Not on file  Tobacco Use   Smoking status: Former    Packs/day: 1.00    Years: 40.00    Additional pack years: 0.00    Total pack years: 40.00    Types: Cigarettes    Quit  date: 03/20/1985    Years since quitting: 37.2   Smokeless tobacco: Never  Vaping Use   Vaping Use: Never used  Substance and Sexual Activity   Alcohol use: No   Drug use: No   Sexual activity: Yes    Birth control/protection: Surgical  Other Topics Concern   Not on file  Social History Narrative   Heather [name] is a 82 year old divorced female who lives alone. She has support of her son and sister.    Social Determinants of Health   Financial Resource Strain: High Risk (11/08/2021)   Overall Financial Resource Strain (CARDIA)    Difficulty of Paying Living Expenses: Very hard  Food Insecurity: No Food Insecurity (03/13/2022)   Hunger Vital Sign    Worried About Running Out of Food in the Last Year: Never true    Ran Out of Food in the Last Year:  Never true  Transportation Needs: Unmet Transportation Needs (03/15/2022)   PRAPARE - Transportation    Lack of Transportation (Medical): No    Lack of Transportation (Non-Medical): Yes  Physical Activity: Sufficiently Active (11/08/2021)   Exercise Vital Sign    Days of Exercise per Week: 7 days    Minutes of Exercise per Session: 40 min  Stress: No Stress Concern Present (11/08/2021)   [place]    Feeling of Stress : Not at all  Social Connections: Moderately Isolated (11/08/2021)   Social Connection and Isolation Panel [NHANES]    Frequency of Communication with Friends and Family: Twice a week    Frequency of Social Gatherings with Friends and Family: Once a week    Attends Religious Services: More than 4 times per year    Active Member of [employer] or Organizations: No    Attends Archivist Meetings: Never    Marital Status: Widowed   Past Surgical History:  Procedure Laterality Date   ABDOMINAL HYSTERECTOMY     BACK SURGERY     lumbar-disc   BUNIONECTOMY Bilateral    FOOT SURGERY Left    repair of "kissing Cousins"   JOINT REPLACEMENT Bilateral    hip    JOINT REPLACEMENT Right 2010 or 2012   knee   KNEE SURGERY     RIGHT/LEFT HEART CATH AND CORONARY ANGIOGRAPHY N/A 10/27/2021   Procedure: RIGHT/LEFT HEART CATH AND CORONARY ANGIOGRAPHY;  Surgeon: Jettie Booze, MD;  Location: [facility];  Service: Cardiovascular;  Laterality: N/A;   SHOULDER ARTHROSCOPY Right    shoulder surgery in [place] about 2000   SHOULDER HEMI-ARTHROPLASTY Left 03/25/2014   Procedure: LEFT SHOULDER HEMI-ARTHROPLASTY;  Surgeon: Carole Civil, MD;  Location: [facility];  Service: Orthopedics;  Laterality: Left;   Past Surgical History:  Procedure Laterality Date   ABDOMINAL HYSTERECTOMY     BACK SURGERY     lumbar-disc   BUNIONECTOMY Bilateral    FOOT SURGERY Left    repair of "kissing Cousins"   JOINT  REPLACEMENT Bilateral    hip    JOINT REPLACEMENT Right 2010 or 2012   knee   KNEE SURGERY     RIGHT/LEFT HEART CATH AND CORONARY ANGIOGRAPHY N/A 10/27/2021   Procedure: RIGHT/LEFT HEART CATH AND CORONARY ANGIOGRAPHY;  Surgeon: Jettie Booze, MD;  Location: [facility];  Service: Cardiovascular;  Laterality: N/A;   SHOULDER ARTHROSCOPY Right    shoulder surgery in [place] about 2000   SHOULDER HEMI-ARTHROPLASTY Left 03/25/2014   Procedure: LEFT SHOULDER  HEMI-ARTHROPLASTY;  Surgeon: Carole Civil, MD;  Location: [facility];  Service: Orthopedics;  Laterality: Left;   Past Medical History:  Diagnosis Date   Anemia    Anxiety    Arthritis    COPD (chronic obstructive pulmonary disease) ([facility])    CVA (cerebral vascular accident) ([place])    Depression    Dry eyes 04/14/2014   GERD (gastroesophageal reflux disease)    Glaucoma    Gout    HTN (hypertension)    OSA (obstructive sleep apnea)    BP 107/63   Pulse 76   Ht 5' 2.5" (1.588 m)   Wt 138 lb (62.6 kg)   BMI 24.84 kg/m   Opioid Risk Score:   Fall Risk Score:  `1  Depression screen [facility] 2/9     05/26/2022   11:05 AM 04/03/2022   10:33 AM 02/02/2022    9:54 AM 11/08/2021    2:26 PM 11/08/2021    2:25 PM 11/03/2021    2:18 PM 06/30/2020   10:31 AM  Depression screen PHQ 2/9  Decreased Interest 0 1 0 0 0 0 0  Down, Depressed, Hopeless 0 0 0 0 0 0 0  PHQ - 2 Score 0 1 0 0 0 0 0  Altered sleeping  2 0      Tired, decreased energy  1 2      Change in appetite  1 1      Feeling bad or failure about yourself   1 0      Trouble concentrating  0 0      Moving slowly or fidgety/restless  1 0      Suicidal thoughts  0 0      PHQ-9 Score  7 3      Difficult doing work/chores  Not difficult at all          Review of Systems  Constitutional: Negative.   HENT: Negative.    Eyes: Negative.   Respiratory: Negative.    Cardiovascular: Negative.   Gastrointestinal: Negative.   Endocrine: Negative.   Genitourinary:  Negative.   Musculoskeletal:  Positive for back pain and gait problem.  Skin: Negative.   Allergic/Immunologic: Negative.   Hematological: Negative.   Psychiatric/Behavioral: Negative.    All other systems reviewed and are negative.      Objective:   Physical Exam   Gen: no distress, normal appearing HEENT: oral mucosa pink and moist, NCAT Chest: normal effort, normal rate of breathing Abd: soft, non-distended Ext: no edema Psych: pleasant, normal affect Skin: intact Neuro: Alert and oriented, follows commands, cranial nerves II through XII grossly intact, normal speech and language Strength 5 out of 5 in all 4 extremities Sensation intact light touch in all 4 extremities No coordination deficits noted Musculoskeletal:  Slump test negative bilaterally Facet loading negative Lumbar paraspinal muscle tenderness on exam   10/06/16 IMPRESSION: 1. 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 This cyst partially effaces the thecal sac and impinges on the exiting T12 nerve root. 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 Further description above. 2. Lower lumbar facet arthropathy with grade 1 anterolisthesis at L4-5 and L5-S1. 3. 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 4. Prior L5-S1 decompressive laminectomy with patent thecal sac. 5. Prominent iliopsoas atrophy bilaterally. Status post bilateral hip replacement.      Assessment & Plan:   Assessment and Plan:  Heather [name] is a 82 y.o. year old female  who  has a past medical history of Anemia, Anxiety, Arthritis, COPD (chronic obstructive pulmonary disease) ([place]), CVA (cerebral vascular accident) ([place]), Depression, Dry eyes (04/14/2014), GERD (gastroesophageal reflux disease), Glaucoma, Gout, HTN (hypertension), and OSA (obstructive sleep apnea).   They are presenting to PM&R clinic as a new  patient for treatment of chronic back pain.      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 Aundra Dubin and Audry Riles  due QT   Depression Well controlled with vanlfexine per patient

## 2022-05-26 NOTE — Patient Instructions (Signed)
It was a pleasure to see you today.  Thank you for giving [place] the opportunity to be involved in your care.  Below is a brief recap of your visit and next steps.  We will plan to see you again in 3 months.  Summary No medication changes today Repeat labs have been ordered We will follow up in 3 months

## 2022-05-27 DIAGNOSIS — J441 Chronic obstructive pulmonary disease with (acute) exacerbation: Secondary | ICD-10-CM | POA: Diagnosis not present

## 2022-05-28 ENCOUNTER — Other Ambulatory Visit: Payer: Self-pay | Admitting: Internal Medicine

## 2022-05-28 DIAGNOSIS — M1A9XX0 Chronic gout, unspecified, without tophus (tophi): Secondary | ICD-10-CM

## 2022-05-28 LAB — LIPID PANEL
Chol/HDL Ratio: 2.4 ratio (ref 0.0–4.4)
Cholesterol, Total: 159 mg/dL (ref 100–199)
HDL: 65 mg/dL (ref >39)
LDL Chol Calc (NIH): 82 mg/dL (ref 0–99)
Triglycerides: 58 mg/dL (ref 0–149)
VLDL Cholesterol Cal: 12 mg/dL (ref 5–40)

## 2022-05-28 LAB — CBC WITH DIFFERENTIAL/PLATELET
Basophils Absolute: 0.1 10*3/uL (ref 0.0–0.2)
Basos: 1 %
EOS (ABSOLUTE): 0.3 10*3/uL (ref 0.0–0.4)
Eos: 5 %
Hematocrit: 37.3 % (ref 34.0–46.6)
Hemoglobin: 12.1 g/dL (ref 11.1–15.9)
Immature Grans (Abs): 0 10*3/uL (ref 0.0–0.1)
Immature Granulocytes: 0 %
Lymphocytes Absolute: 1.3 10*3/uL (ref 0.7–3.1)
Lymphs: 24 %
MCH: 30.8 pg (ref 26.6–33.0)
MCHC: 32.4 g/dL (ref 31.5–35.7)
MCV: 95 fL (ref 79–97)
Monocytes Absolute: 0.7 10*3/uL (ref 0.1–0.9)
Monocytes: 12 %
Neutrophils Absolute: 3.1 10*3/uL (ref 1.4–7.0)
Neutrophils: 58 %
Platelets: 259 10*3/uL (ref 150–450)
RBC: 3.93 x10E6/uL (ref 3.77–5.28)
RDW: 14.7 % (ref 11.7–15.4)
WBC: 5.4 10*3/uL (ref 3.4–10.8)

## 2022-05-28 LAB — CMP14+EGFR
ALT: 16 IU/L (ref 0–32)
AST: 20 IU/L (ref 0–40)
Albumin/Globulin Ratio: 1.4 (ref 1.2–2.2)
Albumin: 3.8 g/dL (ref 3.8–4.8)
Alkaline Phosphatase: 85 IU/L (ref 44–121)
BUN/Creatinine Ratio: 38 — ABNORMAL HIGH (ref 12–28)
BUN: 53 mg/dL — ABNORMAL HIGH (ref 8–27)
Bilirubin Total: 0.3 mg/dL (ref 0.0–1.2)
CO2: 22 mmol/L (ref 20–29)
Calcium: 9.2 mg/dL (ref 8.7–10.3)
Chloride: 102 mmol/L (ref 96–106)
Creatinine, Ser: 1.41 mg/dL — ABNORMAL HIGH (ref 0.57–1.00)
Globulin, Total: 2.7 g/dL (ref 1.5–4.5)
Glucose: 93 mg/dL (ref 70–99)
Potassium: 4.6 mmol/L (ref 3.5–5.2)
Sodium: 139 mmol/L (ref 134–144)
Total Protein: 6.5 g/dL (ref 6.0–8.5)
eGFR: 38 mL/min/{1.73_m2} — ABNORMAL LOW (ref >59)

## 2022-05-28 LAB — VITAMIN D 25 HYDROXY (VIT D DEFICIENCY, FRACTURES): Vit D, 25-Hydroxy: 60.7 ng/mL (ref 30.0–100.0)

## 2022-05-28 LAB — HEMOGLOBIN A1C
Est. average glucose Bld gHb Est-mCnc: 140 mg/dL
Hgb A1c MFr Bld: 6.5 % — ABNORMAL HIGH (ref 4.8–5.6)

## 2022-05-28 LAB — B12 AND FOLATE PANEL
Folate: >20 ng/mL (ref >3.0)
Vitamin B-12: 778 pg/mL (ref 232–1245)

## 2022-05-28 LAB — TSH+FREE T4
Free T4: 1.43 ng/dL (ref 0.82–1.77)
TSH: 1.32 u[IU]/mL (ref 0.450–4.500)

## 2022-05-30 ENCOUNTER — Other Ambulatory Visit (HOSPITAL_COMMUNITY): Payer: Medicare Other

## 2022-05-30 DIAGNOSIS — Z79899 Other long term (current) drug therapy: Secondary | ICD-10-CM | POA: Diagnosis not present

## 2022-05-30 DIAGNOSIS — I5082 Biventricular heart failure: Secondary | ICD-10-CM | POA: Diagnosis not present

## 2022-05-30 DIAGNOSIS — E785 Hyperlipidemia, unspecified: Secondary | ICD-10-CM | POA: Diagnosis not present

## 2022-05-30 DIAGNOSIS — I2510 Atherosclerotic heart disease of native coronary artery without angina pectoris: Secondary | ICD-10-CM | POA: Diagnosis not present

## 2022-05-30 DIAGNOSIS — E876 Hypokalemia: Secondary | ICD-10-CM | POA: Diagnosis not present

## 2022-05-30 DIAGNOSIS — I5021 Acute systolic (congestive) heart failure: Secondary | ICD-10-CM | POA: Diagnosis not present

## 2022-05-30 DIAGNOSIS — I447 Left bundle-branch block, unspecified: Secondary | ICD-10-CM | POA: Diagnosis not present

## 2022-05-30 DIAGNOSIS — F419 Anxiety disorder, unspecified: Secondary | ICD-10-CM | POA: Diagnosis not present

## 2022-05-30 DIAGNOSIS — M1990 Unspecified osteoarthritis, unspecified site: Secondary | ICD-10-CM | POA: Diagnosis not present

## 2022-05-30 DIAGNOSIS — I130 Hypertensive heart and chronic kidney disease with heart failure and stage 1 through stage 4 chronic kidney disease, or unspecified chronic kidney disease: Secondary | ICD-10-CM | POA: Diagnosis not present

## 2022-05-30 DIAGNOSIS — I051 Rheumatic mitral insufficiency: Secondary | ICD-10-CM | POA: Diagnosis not present

## 2022-05-30 DIAGNOSIS — G4733 Obstructive sleep apnea (adult) (pediatric): Secondary | ICD-10-CM | POA: Diagnosis not present

## 2022-05-30 DIAGNOSIS — I428 Other cardiomyopathies: Secondary | ICD-10-CM | POA: Diagnosis not present

## 2022-05-30 DIAGNOSIS — J449 Chronic obstructive pulmonary disease, unspecified: Secondary | ICD-10-CM | POA: Diagnosis not present

## 2022-05-30 DIAGNOSIS — I272 Pulmonary hypertension, unspecified: Secondary | ICD-10-CM | POA: Diagnosis not present

## 2022-05-30 DIAGNOSIS — N1832 Chronic kidney disease, stage 3b: Secondary | ICD-10-CM | POA: Diagnosis not present

## 2022-05-30 DIAGNOSIS — I5023 Acute on chronic systolic (congestive) heart failure: Secondary | ICD-10-CM | POA: Diagnosis not present

## 2022-05-30 DIAGNOSIS — M1030 Gout due to renal impairment, unspecified site: Secondary | ICD-10-CM | POA: Diagnosis not present

## 2022-05-31 LAB — BASIC METABOLIC PANEL
BUN/Creatinine Ratio: 28 (ref 12–28)
BUN: 45 mg/dL — ABNORMAL HIGH (ref 8–27)
CO2: 21 mmol/L (ref 20–29)
Calcium: 9.5 mg/dL (ref 8.7–10.3)
Chloride: 100 mmol/L (ref 96–106)
Creatinine, Ser: 1.58 mg/dL — ABNORMAL HIGH (ref 0.57–1.00)
Glucose: 105 mg/dL — ABNORMAL HIGH (ref 70–99)
Potassium: 4.4 mmol/L (ref 3.5–5.2)
Sodium: 139 mmol/L (ref 134–144)
eGFR: 33 mL/min/{1.73_m2} — ABNORMAL LOW (ref >59)

## 2022-06-01 ENCOUNTER — Encounter: Payer: Self-pay | Admitting: Internal Medicine

## 2022-06-01 DIAGNOSIS — E876 Hypokalemia: Secondary | ICD-10-CM | POA: Diagnosis not present

## 2022-06-01 DIAGNOSIS — I447 Left bundle-branch block, unspecified: Secondary | ICD-10-CM | POA: Diagnosis not present

## 2022-06-01 DIAGNOSIS — M1990 Unspecified osteoarthritis, unspecified site: Secondary | ICD-10-CM | POA: Diagnosis not present

## 2022-06-01 DIAGNOSIS — I5023 Acute on chronic systolic (congestive) heart failure: Secondary | ICD-10-CM | POA: Diagnosis not present

## 2022-06-01 DIAGNOSIS — I5082 Biventricular heart failure: Secondary | ICD-10-CM | POA: Diagnosis not present

## 2022-06-01 DIAGNOSIS — E785 Hyperlipidemia, unspecified: Secondary | ICD-10-CM | POA: Diagnosis not present

## 2022-06-01 DIAGNOSIS — M1030 Gout due to renal impairment, unspecified site: Secondary | ICD-10-CM | POA: Diagnosis not present

## 2022-06-01 DIAGNOSIS — I272 Pulmonary hypertension, unspecified: Secondary | ICD-10-CM | POA: Diagnosis not present

## 2022-06-01 DIAGNOSIS — J449 Chronic obstructive pulmonary disease, unspecified: Secondary | ICD-10-CM | POA: Diagnosis not present

## 2022-06-01 DIAGNOSIS — I130 Hypertensive heart and chronic kidney disease with heart failure and stage 1 through stage 4 chronic kidney disease, or unspecified chronic kidney disease: Secondary | ICD-10-CM | POA: Diagnosis not present

## 2022-06-01 DIAGNOSIS — I2510 Atherosclerotic heart disease of native coronary artery without angina pectoris: Secondary | ICD-10-CM | POA: Diagnosis not present

## 2022-06-01 DIAGNOSIS — F419 Anxiety disorder, unspecified: Secondary | ICD-10-CM | POA: Diagnosis not present

## 2022-06-01 DIAGNOSIS — I051 Rheumatic mitral insufficiency: Secondary | ICD-10-CM | POA: Diagnosis not present

## 2022-06-01 DIAGNOSIS — I428 Other cardiomyopathies: Secondary | ICD-10-CM | POA: Diagnosis not present

## 2022-06-01 DIAGNOSIS — N1832 Chronic kidney disease, stage 3b: Secondary | ICD-10-CM | POA: Diagnosis not present

## 2022-06-01 DIAGNOSIS — G4733 Obstructive sleep apnea (adult) (pediatric): Secondary | ICD-10-CM | POA: Diagnosis not present

## 2022-06-01 NOTE — Assessment & Plan Note (Signed)
Asymptomatic currently.  Her pulmonary exam today is unremarkable.  Followed by pulmonology (Dr. Halford Chessman).  She was prescribed Trelegy, which she reports using once every 2-3 days. -No medication changes today

## 2022-06-01 NOTE — Assessment & Plan Note (Signed)
Cardiac MRI completed in January, demonstrating severe LV dilation with EF estimation at 60%.  RV dilation noted as well with EF 23%.  She is followed by the advanced heart failure clinic.  Appears euvolemic on exam today.  She will undergo CRT-P implantation in early May.  Lasix was recently increased to 60 mg daily. -Repeat BMP today given recent medication adjustments. Will forward results to advanced HF team.

## 2022-06-01 NOTE — Assessment & Plan Note (Signed)
Repeat labs ordered today 

## 2022-06-05 DIAGNOSIS — I272 Pulmonary hypertension, unspecified: Secondary | ICD-10-CM | POA: Diagnosis not present

## 2022-06-05 DIAGNOSIS — I130 Hypertensive heart and chronic kidney disease with heart failure and stage 1 through stage 4 chronic kidney disease, or unspecified chronic kidney disease: Secondary | ICD-10-CM | POA: Diagnosis not present

## 2022-06-05 DIAGNOSIS — I2510 Atherosclerotic heart disease of native coronary artery without angina pectoris: Secondary | ICD-10-CM | POA: Diagnosis not present

## 2022-06-05 DIAGNOSIS — M1990 Unspecified osteoarthritis, unspecified site: Secondary | ICD-10-CM | POA: Diagnosis not present

## 2022-06-05 DIAGNOSIS — I5023 Acute on chronic systolic (congestive) heart failure: Secondary | ICD-10-CM | POA: Diagnosis not present

## 2022-06-05 DIAGNOSIS — I428 Other cardiomyopathies: Secondary | ICD-10-CM | POA: Diagnosis not present

## 2022-06-05 DIAGNOSIS — G4733 Obstructive sleep apnea (adult) (pediatric): Secondary | ICD-10-CM | POA: Diagnosis not present

## 2022-06-05 DIAGNOSIS — I5082 Biventricular heart failure: Secondary | ICD-10-CM | POA: Diagnosis not present

## 2022-06-05 DIAGNOSIS — I051 Rheumatic mitral insufficiency: Secondary | ICD-10-CM | POA: Diagnosis not present

## 2022-06-05 DIAGNOSIS — E876 Hypokalemia: Secondary | ICD-10-CM | POA: Diagnosis not present

## 2022-06-05 DIAGNOSIS — J449 Chronic obstructive pulmonary disease, unspecified: Secondary | ICD-10-CM | POA: Diagnosis not present

## 2022-06-05 DIAGNOSIS — M1030 Gout due to renal impairment, unspecified site: Secondary | ICD-10-CM | POA: Diagnosis not present

## 2022-06-05 DIAGNOSIS — N1832 Chronic kidney disease, stage 3b: Secondary | ICD-10-CM | POA: Diagnosis not present

## 2022-06-05 DIAGNOSIS — F419 Anxiety disorder, unspecified: Secondary | ICD-10-CM | POA: Diagnosis not present

## 2022-06-05 DIAGNOSIS — I447 Left bundle-branch block, unspecified: Secondary | ICD-10-CM | POA: Diagnosis not present

## 2022-06-05 DIAGNOSIS — E785 Hyperlipidemia, unspecified: Secondary | ICD-10-CM | POA: Diagnosis not present

## 2022-06-13 ENCOUNTER — Telehealth: Payer: Self-pay

## 2022-06-13 MED ORDER — HYDROCODONE-ACETAMINOPHEN 5-325 MG PO TABS: 1.0000 | ORAL_TABLET | Freq: Four times a day (QID) | ORAL | 0 refills | Status: DC | PRN

## 2022-06-13 NOTE — Telephone Encounter (Signed)
PMP REPORT:  Filled  Written  ID  Drug  QTY  Days  Prescriber  RX #  Dispenser  Refill  Daily Dose*  Pymt Type  PMP  05/02/2022 05/02/2022 2  Hydrocodone-Acetamin 5-325 Mg 120.00 30 Yu Sht [id number] Car [phone]) 0/0 20.00 MME Medicare [facility] 05/01/2022 11/16/2021 2  Zolpidem Tartrate 10 Mg Tablet 30.00 30 Vi Soo [id number] Car ([id number]) 4/5 0.50 LME Medicare [facility] 03/29/2022 01/30/2022 2  Hydrocodone-Acetamin 5-325 Mg 90.00 22 Ay Pow [id number] Car ([id number]) 0/0 20.45 MME Medicare 

## 2022-06-19 DIAGNOSIS — I130 Hypertensive heart and chronic kidney disease with heart failure and stage 1 through stage 4 chronic kidney disease, or unspecified chronic kidney disease: Secondary | ICD-10-CM | POA: Diagnosis not present

## 2022-06-19 DIAGNOSIS — E876 Hypokalemia: Secondary | ICD-10-CM | POA: Diagnosis not present

## 2022-06-19 DIAGNOSIS — J449 Chronic obstructive pulmonary disease, unspecified: Secondary | ICD-10-CM | POA: Diagnosis not present

## 2022-06-19 DIAGNOSIS — G4733 Obstructive sleep apnea (adult) (pediatric): Secondary | ICD-10-CM | POA: Diagnosis not present

## 2022-06-19 DIAGNOSIS — N1832 Chronic kidney disease, stage 3b: Secondary | ICD-10-CM | POA: Diagnosis not present

## 2022-06-19 DIAGNOSIS — F419 Anxiety disorder, unspecified: Secondary | ICD-10-CM | POA: Diagnosis not present

## 2022-06-19 DIAGNOSIS — I5082 Biventricular heart failure: Secondary | ICD-10-CM | POA: Diagnosis not present

## 2022-06-19 DIAGNOSIS — I272 Pulmonary hypertension, unspecified: Secondary | ICD-10-CM | POA: Diagnosis not present

## 2022-06-19 DIAGNOSIS — I428 Other cardiomyopathies: Secondary | ICD-10-CM | POA: Diagnosis not present

## 2022-06-19 DIAGNOSIS — M1030 Gout due to renal impairment, unspecified site: Secondary | ICD-10-CM | POA: Diagnosis not present

## 2022-06-19 DIAGNOSIS — E785 Hyperlipidemia, unspecified: Secondary | ICD-10-CM | POA: Diagnosis not present

## 2022-06-19 DIAGNOSIS — I051 Rheumatic mitral insufficiency: Secondary | ICD-10-CM | POA: Diagnosis not present

## 2022-06-19 DIAGNOSIS — M1990 Unspecified osteoarthritis, unspecified site: Secondary | ICD-10-CM | POA: Diagnosis not present

## 2022-06-19 DIAGNOSIS — I5023 Acute on chronic systolic (congestive) heart failure: Secondary | ICD-10-CM | POA: Diagnosis not present

## 2022-06-19 DIAGNOSIS — I2510 Atherosclerotic heart disease of native coronary artery without angina pectoris: Secondary | ICD-10-CM | POA: Diagnosis not present

## 2022-06-19 DIAGNOSIS — I447 Left bundle-branch block, unspecified: Secondary | ICD-10-CM | POA: Diagnosis not present

## 2022-06-26 DIAGNOSIS — I5023 Acute on chronic systolic (congestive) heart failure: Secondary | ICD-10-CM | POA: Diagnosis not present

## 2022-06-26 DIAGNOSIS — I428 Other cardiomyopathies: Secondary | ICD-10-CM | POA: Diagnosis not present

## 2022-06-26 DIAGNOSIS — J449 Chronic obstructive pulmonary disease, unspecified: Secondary | ICD-10-CM | POA: Diagnosis not present

## 2022-06-26 DIAGNOSIS — I5082 Biventricular heart failure: Secondary | ICD-10-CM | POA: Diagnosis not present

## 2022-06-26 DIAGNOSIS — I051 Rheumatic mitral insufficiency: Secondary | ICD-10-CM | POA: Diagnosis not present

## 2022-06-26 DIAGNOSIS — I130 Hypertensive heart and chronic kidney disease with heart failure and stage 1 through stage 4 chronic kidney disease, or unspecified chronic kidney disease: Secondary | ICD-10-CM | POA: Diagnosis not present

## 2022-06-26 DIAGNOSIS — F419 Anxiety disorder, unspecified: Secondary | ICD-10-CM | POA: Diagnosis not present

## 2022-06-26 DIAGNOSIS — I2510 Atherosclerotic heart disease of native coronary artery without angina pectoris: Secondary | ICD-10-CM | POA: Diagnosis not present

## 2022-06-26 DIAGNOSIS — I447 Left bundle-branch block, unspecified: Secondary | ICD-10-CM | POA: Diagnosis not present

## 2022-06-26 DIAGNOSIS — N1832 Chronic kidney disease, stage 3b: Secondary | ICD-10-CM | POA: Diagnosis not present

## 2022-06-26 DIAGNOSIS — M1990 Unspecified osteoarthritis, unspecified site: Secondary | ICD-10-CM | POA: Diagnosis not present

## 2022-06-26 DIAGNOSIS — I272 Pulmonary hypertension, unspecified: Secondary | ICD-10-CM | POA: Diagnosis not present

## 2022-06-26 DIAGNOSIS — E876 Hypokalemia: Secondary | ICD-10-CM | POA: Diagnosis not present

## 2022-06-26 DIAGNOSIS — M1030 Gout due to renal impairment, unspecified site: Secondary | ICD-10-CM | POA: Diagnosis not present

## 2022-06-26 DIAGNOSIS — G4733 Obstructive sleep apnea (adult) (pediatric): Secondary | ICD-10-CM | POA: Diagnosis not present

## 2022-06-26 DIAGNOSIS — E785 Hyperlipidemia, unspecified: Secondary | ICD-10-CM | POA: Diagnosis not present

## 2022-06-30 ENCOUNTER — Telehealth: Payer: Self-pay | Admitting: Internal Medicine

## 2022-06-30 DIAGNOSIS — I5022 Chronic systolic (congestive) heart failure: Secondary | ICD-10-CM

## 2022-06-30 DIAGNOSIS — I420 Dilated cardiomyopathy: Secondary | ICD-10-CM

## 2022-06-30 DIAGNOSIS — Z01812 Encounter for preprocedural laboratory examination: Secondary | ICD-10-CM

## 2022-06-30 DIAGNOSIS — I447 Left bundle-branch block, unspecified: Secondary | ICD-10-CM

## 2022-06-30 NOTE — Telephone Encounter (Signed)
Patient states she wants to do her lab test at [facility] lab.  Patient wants lab orders transferred to [facility].

## 2022-06-30 NOTE — Telephone Encounter (Signed)
Spoke with pt and she is agreeable to have labs completed at the [facility] in [place].  Pt advised orders have been released to [facility].  Pt thanked RN for the call.

## 2022-07-03 ENCOUNTER — Ambulatory Visit (HOSPITAL_BASED_OUTPATIENT_CLINIC_OR_DEPARTMENT_OTHER)
Admission: RE | Admit: 2022-07-03 | Discharge: 2022-07-03 | Disposition: A | Discharge status: Home / Self Care | Payer: Medicare Other | Source: Ambulatory Visit | Attending: Cardiology | Admitting: Cardiology

## 2022-07-03 ENCOUNTER — Ambulatory Visit (HOSPITAL_COMMUNITY)
Admission: RE | Admit: 2022-07-03 | Discharge: 2022-07-03 | Disposition: A | Discharge status: Home / Self Care | Payer: Medicare Other | Source: Ambulatory Visit | Attending: Internal Medicine | Admitting: Internal Medicine

## 2022-07-03 ENCOUNTER — Encounter (HOSPITAL_COMMUNITY): Payer: Self-pay | Admitting: Cardiology

## 2022-07-03 VITALS — BP 102/50 | HR 68 | Wt 141.4 lb

## 2022-07-03 DIAGNOSIS — I5021 Acute systolic (congestive) heart failure: Secondary | ICD-10-CM | POA: Diagnosis not present

## 2022-07-03 DIAGNOSIS — G4733 Obstructive sleep apnea (adult) (pediatric): Secondary | ICD-10-CM | POA: Diagnosis not present

## 2022-07-03 DIAGNOSIS — Z87891 Personal history of nicotine dependence: Secondary | ICD-10-CM | POA: Insufficient documentation

## 2022-07-03 DIAGNOSIS — I5022 Chronic systolic (congestive) heart failure: Secondary | ICD-10-CM | POA: Diagnosis not present

## 2022-07-03 DIAGNOSIS — I130 Hypertensive heart and chronic kidney disease with heart failure and stage 1 through stage 4 chronic kidney disease, or unspecified chronic kidney disease: Secondary | ICD-10-CM | POA: Diagnosis not present

## 2022-07-03 DIAGNOSIS — Z833 Family history of diabetes mellitus: Secondary | ICD-10-CM | POA: Diagnosis not present

## 2022-07-03 DIAGNOSIS — I447 Left bundle-branch block, unspecified: Secondary | ICD-10-CM | POA: Diagnosis not present

## 2022-07-03 DIAGNOSIS — Z8673 Personal history of transient ischemic attack (TIA), and cerebral infarction without residual deficits: Secondary | ICD-10-CM | POA: Insufficient documentation

## 2022-07-03 DIAGNOSIS — R0602 Shortness of breath: Secondary | ICD-10-CM | POA: Diagnosis not present

## 2022-07-03 DIAGNOSIS — Z5982 Transportation insecurity: Secondary | ICD-10-CM | POA: Insufficient documentation

## 2022-07-03 DIAGNOSIS — I2510 Atherosclerotic heart disease of native coronary artery without angina pectoris: Secondary | ICD-10-CM | POA: Diagnosis not present

## 2022-07-03 DIAGNOSIS — I428 Other cardiomyopathies: Secondary | ICD-10-CM | POA: Insufficient documentation

## 2022-07-03 DIAGNOSIS — Z5986 Financial insecurity: Secondary | ICD-10-CM | POA: Diagnosis not present

## 2022-07-03 DIAGNOSIS — I252 Old myocardial infarction: Secondary | ICD-10-CM | POA: Insufficient documentation

## 2022-07-03 DIAGNOSIS — Z8249 Family history of ischemic heart disease and other diseases of the circulatory system: Secondary | ICD-10-CM | POA: Insufficient documentation

## 2022-07-03 DIAGNOSIS — N1832 Chronic kidney disease, stage 3b: Secondary | ICD-10-CM | POA: Diagnosis not present

## 2022-07-03 DIAGNOSIS — Z79899 Other long term (current) drug therapy: Secondary | ICD-10-CM | POA: Insufficient documentation

## 2022-07-03 DIAGNOSIS — J449 Chronic obstructive pulmonary disease, unspecified: Secondary | ICD-10-CM | POA: Diagnosis not present

## 2022-07-03 DIAGNOSIS — E785 Hyperlipidemia, unspecified: Secondary | ICD-10-CM

## 2022-07-03 DIAGNOSIS — I272 Pulmonary hypertension, unspecified: Secondary | ICD-10-CM | POA: Diagnosis not present

## 2022-07-03 DIAGNOSIS — Z7984 Long term (current) use of oral hypoglycemic drugs: Secondary | ICD-10-CM | POA: Insufficient documentation

## 2022-07-03 LAB — BASIC METABOLIC PANEL
Anion gap: 10 (ref 5–15)
BUN: 60 mg/dL — ABNORMAL HIGH (ref 8–23)
CO2: 23 mmol/L (ref 22–32)
Calcium: 9.1 mg/dL (ref 8.9–10.3)
Chloride: 102 mmol/L (ref 98–111)
Creatinine, Ser: 1.5 mg/dL — ABNORMAL HIGH (ref 0.44–1.00)
GFR, Estimated: 35 mL/min — ABNORMAL LOW (ref >60)
Glucose, Bld: 90 mg/dL (ref 70–99)
Potassium: 4.3 mmol/L (ref 3.5–5.1)
Sodium: 135 mmol/L (ref 135–145)

## 2022-07-03 LAB — ECHOCARDIOGRAM COMPLETE
AR max vel: 1.64 cm2
AV Area VTI: 1.69 cm2
AV Area mean vel: 1.71 cm2
AV Mean grad: 5 mmHg
AV Peak grad: 9.1 mmHg
Ao pk vel: 1.51 m/s
Area-P 1/2: 4.39 cm2
Est EF: <20
S' Lateral: 6.2 cm
Single Plane A4C EF: 8.6 %

## 2022-07-03 LAB — BRAIN NATRIURETIC PEPTIDE: B Natriuretic Peptide: 1741.4 pg/mL — ABNORMAL HIGH (ref 0.0–100.0)

## 2022-07-03 LAB — DIGOXIN LEVEL: Digoxin Level: 1 ng/mL (ref 0.8–2.0)

## 2022-07-03 MED ORDER — HYDRALAZINE HCL 25 MG PO TABS: 25.0000 mg | ORAL_TABLET | Freq: Three times a day (TID) | ORAL | 3 refills | Status: DC

## 2022-07-03 MED ORDER — DIGOXIN 125 MCG PO TABS: 0.0625 mg | ORAL_TABLET | Freq: Every day | ORAL | 3 refills | Status: DC

## 2022-07-03 MED ORDER — ISOSORBIDE MONONITRATE ER 30 MG PO TB24: 30.0000 mg | ORAL_TABLET | Freq: Every day | ORAL | 3 refills | Status: DC

## 2022-07-03 MED ORDER — ATORVASTATIN CALCIUM 20 MG PO TABS
20.0000 mg | ORAL_TABLET | Freq: Every day | ORAL | 3 refills | Status: DC
Start: 2022-07-03 — End: 2023-06-03

## 2022-07-03 NOTE — Patient Instructions (Signed)
INCREASE Hydralazine to 25 mg Three times a day  STOP Isodil   START Imdur 30 mg daily.  Labs done today, your results will be available in MyChart, we will contact you for abnormal readings.  Your physician recommends that you schedule a follow-up appointment in: 2 months  If you have any questions or concerns before your next appointment please send [place] a message through [place] or call our office at [phone].    TO LEAVE A MESSAGE FOR THE NURSE SELECT OPTION 2, PLEASE LEAVE A MESSAGE INCLUDING: YOUR NAME DATE OF BIRTH CALL BACK NUMBER REASON FOR CALL**this is important as we prioritize the call backs  YOU WILL RECEIVE A CALL BACK THE SAME DAY AS LONG AS YOU CALL BEFORE 4:00 PM  At the [facility], you and your health needs are our priority. As part of our continuing mission to provide you with exceptional heart care, we have created designated Provider Care Teams. These Care Teams include your primary Cardiologist (physician) and Advanced Practice Providers (APPs- Physician Assistants and Nurse Practitioners) who all work together to provide you with the care you need, when you need it.   You may see any of the following providers on your designated Care Team at your next follow up: Dr Arvilla Meres Dr Marca Ancona Dr. Marcos Eke, NP Robbie Lis, [place] [facility] [place], [place] Brynda Peon, NP Karle Plumber, PharmD   Please be sure to bring in all your medications bottles to every appointment.    Thank you for choosing [facility]

## 2022-07-03 NOTE — Progress Notes (Signed)
ADVANCED HF CLINIC NOTE  Primary Care: [name], MD HF Cardiologist: Dr. Shirlee Latch  HPI: 82 y.o. [name] w/ h/o CKD IIIb, LBBB, R cerebellar CVA and chronic biventricular heart failure first diagnosed 10/2021. Echo then showed severely reduced LVEF 15-20%, RV mildly reduced, mild-mod MR. Echo also showed dyssynchrony c/w LBBB. Of note, prior echo in 2017 showed normal LVEF w/ septal dyssynchrony and normal RV.   She underwent [employer] 10/2021 which showed mild nonobstructive CAD. 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 GDMT regimen included Farxiga, Imdur, Bisoprolol. Diuretic regimen Lasix 20 mg daily.    Echo 12/23 showed EF still severely reduced < 15%, progression of RV dysfunction (mod reduced) and severely elevated PA pressure w/ estimated RVSP 68 mmHg.     Admitted 1/24 with CHF exacerbation. Started on lasix gtt, and placed on empiric milrinone to augment diuresis. GDMT held w/ soft BP. cMRI showed LVEF 16%, mid-apical inferolateral subendocardial LGE, and moderately dilated RV with RVEF 23%. Milrinone was weaned and GDMT titrated. She was discharged home with HH, weight 150 lbs.  Echo was done today and reviewed, EF <20% with moderate LV enlargement, septal-lateral dyssynchrony, moderate RV dysfunction, PASP 65 mmHg.   She returns for followup of CHF. Weight is down 3 lbs.  Symptoms wax and wane.  Currently, no dyspnea walking around her house.  She can walk through [employer] without stopping.  She is short of breath with stairs/inclines.  She chronically sleeps on 2 pillows. No chest pain. Mild lightheadedness if she jumps up too fast.    ECG (personally reviewed): NSR, LBBB 184 msec  Labs (1/24): K 4.0, creatinine 1.33, hgb 12.9 Labs (3/24): K 4.4, creatinine 1.58, LDL 82  PMH: 1. Chronic systolic CHF: Nonischemic cardiomyopathy. Chronic LBBB.  - Angioedema with ACEI. - R/LHC (8/23): mild, non-obstructive  CAD; RA mean 8, PA 54/18 (mean 34), PCWP 18, CO/CI ([place]) 3.87/2.24  - Echo (12/23): EF < 15%, moderately reduced RV function, severely elevated PA pressure (RVSP 68 mmHg) - Cardiac MRI (1/24): LVEF 16%, RVEF 23%, mid-apical inferolateral LGE in prior infarction pattern (looks like prior MI but does not explain extent of CMP).  - Echo (4/24): EF <20% with moderate LV enlargement, septal-lateral dyssynchrony, moderate RV dysfunction, PASP 65 mmHg.  2. COPD: Prior smoker.  3. CKD stage 3 4. CVA: prior cerebellar CVA.  5. HTN 6. OSA  Current Outpatient Medications  Medication Sig Dispense Refill   acetaminophen (TYLENOL) 500 MG tablet Take 500 mg by mouth every 6 (six) hours as needed.     allopurinol (ZYLOPRIM) 300 MG tablet TAKE 1/2 TABLET BY MOUTH ONCE DAILY 45 tablet 0   aspirin (ASPIRIN CHILDRENS) 81 MG chewable tablet Chew 1 tablet (81 mg total) by mouth daily. 90 tablet 3   chlorpheniramine (CHLOR-TRIMETON) 4 MG tablet Take 4 mg by mouth daily as needed for allergies.     Cholecalciferol (DIALYVITE VITAMIN D 5000 PO) Take by mouth daily.     dapagliflozin propanediol (FARXIGA) 10 MG TABS tablet Take 1 tablet (10 mg total) by mouth daily. 30 tablet 10   famotidine (PEPCID) 20 MG tablet Take 1 tablet (20 mg total) by mouth at bedtime. One after supper 30 tablet 3   Fluticasone-Umeclidin-Vilant (TRELEGY ELLIPTA) 100-62.5-25 MCG/ACT AEPB Inhale 1 puff into the lungs daily. 60 each 11   furosemide (LASIX) 40 MG tablet Take 60 mg by mouth daily.  HYDROcodone-acetaminophen (NORCO/VICODIN) 5-325 MG tablet Take 1 tablet by mouth every 6 (six) hours as needed for moderate pain. 120 tablet 0   isosorbide mononitrate (IMDUR) 30 MG 24 hr tablet Take 1 tablet (30 mg total) by mouth daily. 90 tablet 3   ketoconazole (NIZORAL) 2 % cream Apply 1 Application topically as needed for irritation.     losartan (COZAAR) 25 MG tablet Take 1 tablet (25 mg total) by mouth daily. 90 tablet 3   Multiple  Vitamins-Minerals (MULTIVITAMIN WITH MINERALS) tablet Take 1 tablet by mouth daily.     PROAIR HFA 108 (90 Base) MCG/ACT inhaler Inhale 2 puffs into the lungs every 6 (six) hours as needed for wheezing or shortness of breath. 18 g 2   spironolactone (ALDACTONE) 25 MG tablet Take 1 tablet (25 mg total) by mouth daily. 30 tablet 5   venlafaxine XR (EFFEXOR-XR) 37.5 MG 24 hr capsule Take 2 capsules (75 mg total) by mouth See admin instructions. Take 2 capsules by mouth daily 60 capsule 2   atorvastatin (LIPITOR) 20 MG tablet Take 1 tablet (20 mg total) by mouth daily. 90 tablet 3   digoxin (LANOXIN) 0.125 MG tablet Take 0.5 tablets (0.0625 mg total) by mouth daily. 90 tablet 3   hydrALAZINE (APRESOLINE) 25 MG tablet Take 1 tablet (25 mg total) by mouth 3 (three) times daily. 180 tablet 3   No current facility-administered medications for this encounter.   Allergies  Allergen Reactions   Lovastatin Swelling    Patient states that her tongue swells and she gets a tingling feeling all over. Patient states that her tongue swells and she gets a tingling feeling all over.   Lisinopril Swelling    Tongue swelling   Social History   Socioeconomic History   Marital status: Divorced    Spouse name: Not on file   Number of children: Not on file   Years of education: Not on file   Highest education level: Not on file  Occupational History   Not on file  Tobacco Use   Smoking status: Former    Packs/day: 1.00    Years: 40.00    Additional pack years: 0.00    Total pack years: 40.00    Types: Cigarettes    Quit date: 03/20/1985    Years since quitting: 37.3   Smokeless tobacco: Never  Vaping Use   Vaping Use: Never used  Substance and Sexual Activity   Alcohol use: No   Drug use: No   Sexual activity: Yes    Birth control/protection: Surgical  Other Topics Concern   Not on file  Social History Narrative   Mrs Rosamond is a 82 year old divorced female who lives alone. She has support of her  son and sister.    Social Determinants of Health   Financial Resource Strain: High Risk (11/08/2021)   Overall Financial Resource Strain (CARDIA)    Difficulty of Paying Living Expenses: Very hard  Food Insecurity: No Food Insecurity (03/13/2022)   Hunger Vital Sign    Worried About Running Out of Food in the Last Year: Never true    Ran Out of Food in the Last Year: Never true  Transportation Needs: Unmet Transportation Needs (03/15/2022)   PRAPARE - Transportation    Lack of Transportation (Medical): No    Lack of Transportation (Non-Medical): Yes  Physical Activity: Sufficiently Active (11/08/2021)   Exercise Vital Sign    Days of Exercise per Week: 7 days    Minutes of  Exercise per Session: 40 min  Stress: No Stress Concern Present (11/08/2021)   [employer] of Occupational Health - Occupational Stress Questionnaire    Feeling of Stress : Not at all  Social Connections: Moderately Isolated (11/08/2021)   Social Connection and Isolation Panel [NHANES]    Frequency of Communication with Friends and Family: Twice a week    Frequency of Social Gatherings with Friends and Family: Once a week    Attends Religious Services: More than 4 times per year    Active Member of [employer] or Organizations: No    Attends Banker Meetings: Never    Marital Status: Widowed  Intimate Partner Violence: Not At Risk (03/13/2022)   Humiliation, Afraid, Rape, and Kick questionnaire    Fear of Current or Ex-Partner: No    Emotionally Abused: No    Physically Abused: No    Sexually Abused: No   Family History  Problem Relation Age of Onset   High blood pressure Mother    Aneurysm Father    Diabetes Brother    Pancreatitis Brother    Hypertension Niece    Congestive Heart Failure Niece    Wt Readings from Last 3 Encounters:  07/03/22 64.1 kg (141 lb 6.4 oz)  05/26/22 64.6 kg (142 lb 6.4 oz)  05/26/22 62.6 kg (138 lb)   BP (!) 102/50   Pulse 68   Wt 64.1 kg (141 lb 6.4 oz)   SpO2 97%    BMI 25.45 kg/m   PHYSICAL EXAM: General: NAD Neck: No JVD, no thyromegaly or thyroid nodule.  Lungs: Clear to auscultation bilaterally with normal respiratory effort. CV: Nondisplaced PMI.  Heart regular S1/S2, no S3/S4, no murmur.  No peripheral edema.  No carotid bruit.  Normal pedal pulses.  Abdomen: Soft, nontender, no hepatosplenomegaly, no distention.  Skin: Intact without lesions or rashes.  Neurologic: Alert and oriented x 3.  Psych: Normal affect. Extremities: No clubbing or cyanosis.  HEENT: Normal.   ASSESSMENT & PLAN: 1. Chronic systolic CHF: Nonischemic cardiomyopathy. Cath in 8/23 with no significant CAD, CI 2.24. ?LBBB cardiomyopathy versus familial versus prior myocarditis. Echo in 12/23 with EF < 20%, moderately reduced RV function, D-shaped septum, mild-moderate MR. She was admitted 1/24 with volume overload, empirically started on milrinone 0.25 and Lasix gtt.  Cardiac MRI showed LV severely dilated with EF 16% and septal-lateral dyssynchrony, RV moderately dilated with EF 23%, mid-apical inferolateral LGE in prior infarction pattern. However, CAD does not explain the extent of her cardiomyopathy based on cath. Echo today showed EF <20% with moderate LV enlargement, septal-lateral dyssynchrony, moderate RV dysfunction, PASP 65 mmHg. She is not significantly volume overloaded on exam, NYHA class II symptoms.  - She will need to restart bisoprolol 2.5 mg daily. - Continue Lasix 60 mg daily. BMET and BNP today. - Continue spironolactone 25 mg daily.  - Continue digoxin 0.0625 daily. Check level today.  - Continue Farxiga 10 mg daily.  - Continue losartan 25 mg daily (angioedema with lisinopril so no Entresto). - Increase hydralazine to 25 mg tid; stop isordil and start Imdur 30 mg daily.  - Plan for CRT-D implantation in 5/24.  2. CKD stage 3: Baseline creatinine around 1.5. - BMET today. 3. H/o right cerebellar CVA: On ASA 81 and statin.  4. H/o COPD: Prior smoker. No  wheezing on exam today.  - Follows with Pulmonary 5. OSA: Encouraged CPAP use.   Follow up in 2 months with APP.   Marca Ancona 07/03/22

## 2022-07-04 DIAGNOSIS — I447 Left bundle-branch block, unspecified: Secondary | ICD-10-CM | POA: Diagnosis not present

## 2022-07-04 DIAGNOSIS — M1990 Unspecified osteoarthritis, unspecified site: Secondary | ICD-10-CM | POA: Diagnosis not present

## 2022-07-04 DIAGNOSIS — J449 Chronic obstructive pulmonary disease, unspecified: Secondary | ICD-10-CM | POA: Diagnosis not present

## 2022-07-04 DIAGNOSIS — I051 Rheumatic mitral insufficiency: Secondary | ICD-10-CM | POA: Diagnosis not present

## 2022-07-04 DIAGNOSIS — I5082 Biventricular heart failure: Secondary | ICD-10-CM | POA: Diagnosis not present

## 2022-07-04 DIAGNOSIS — E785 Hyperlipidemia, unspecified: Secondary | ICD-10-CM | POA: Diagnosis not present

## 2022-07-04 DIAGNOSIS — I5023 Acute on chronic systolic (congestive) heart failure: Secondary | ICD-10-CM | POA: Diagnosis not present

## 2022-07-04 DIAGNOSIS — I428 Other cardiomyopathies: Secondary | ICD-10-CM | POA: Diagnosis not present

## 2022-07-04 DIAGNOSIS — N1832 Chronic kidney disease, stage 3b: Secondary | ICD-10-CM | POA: Diagnosis not present

## 2022-07-04 DIAGNOSIS — F419 Anxiety disorder, unspecified: Secondary | ICD-10-CM | POA: Diagnosis not present

## 2022-07-04 DIAGNOSIS — I2510 Atherosclerotic heart disease of native coronary artery without angina pectoris: Secondary | ICD-10-CM | POA: Diagnosis not present

## 2022-07-04 DIAGNOSIS — G4733 Obstructive sleep apnea (adult) (pediatric): Secondary | ICD-10-CM | POA: Diagnosis not present

## 2022-07-04 DIAGNOSIS — I130 Hypertensive heart and chronic kidney disease with heart failure and stage 1 through stage 4 chronic kidney disease, or unspecified chronic kidney disease: Secondary | ICD-10-CM | POA: Diagnosis not present

## 2022-07-04 DIAGNOSIS — E876 Hypokalemia: Secondary | ICD-10-CM | POA: Diagnosis not present

## 2022-07-04 DIAGNOSIS — I272 Pulmonary hypertension, unspecified: Secondary | ICD-10-CM | POA: Diagnosis not present

## 2022-07-04 DIAGNOSIS — M1030 Gout due to renal impairment, unspecified site: Secondary | ICD-10-CM | POA: Diagnosis not present

## 2022-07-05 ENCOUNTER — Telehealth: Payer: Self-pay | Admitting: Internal Medicine

## 2022-07-05 ENCOUNTER — Ambulatory Visit: Payer: Medicare Other

## 2022-07-05 ENCOUNTER — Telehealth (HOSPITAL_COMMUNITY): Payer: Self-pay | Admitting: *Deleted

## 2022-07-05 DIAGNOSIS — I5022 Chronic systolic (congestive) heart failure: Secondary | ICD-10-CM | POA: Diagnosis not present

## 2022-07-05 DIAGNOSIS — I420 Dilated cardiomyopathy: Secondary | ICD-10-CM | POA: Diagnosis not present

## 2022-07-05 DIAGNOSIS — Z01812 Encounter for preprocedural laboratory examination: Secondary | ICD-10-CM | POA: Diagnosis not present

## 2022-07-05 DIAGNOSIS — I447 Left bundle-branch block, unspecified: Secondary | ICD-10-CM | POA: Diagnosis not present

## 2022-07-05 NOTE — Telephone Encounter (Signed)
Called patient per Dr. Shirlee Latch with following:  "Repeat digoxin level as a trough (prior to taking am digoxin dose)."  Pt verbalized understanding of same. She will go to [facility] for repeat Dig level next week and will hold her am dose of medication. Order placed.

## 2022-07-05 NOTE — Telephone Encounter (Signed)
Heather [name] called from [facility] [phone] when calls back if no answer can leave a detailed message. Verbal order allow nursing to do evaluation has new meds and confused and asking for meds to be bubble wrap.

## 2022-07-05 NOTE — Telephone Encounter (Signed)
Gave philip verbal orders.

## 2022-07-06 LAB — BASIC METABOLIC PANEL
BUN/Creatinine Ratio: 33 — ABNORMAL HIGH (ref 12–28)
BUN: 48 mg/dL — ABNORMAL HIGH (ref 8–27)
CO2: 21 mmol/L (ref 20–29)
Calcium: 9.9 mg/dL (ref 8.7–10.3)
Chloride: 102 mmol/L (ref 96–106)
Creatinine, Ser: 1.45 mg/dL — ABNORMAL HIGH (ref 0.57–1.00)
Glucose: 107 mg/dL — ABNORMAL HIGH (ref 70–99)
Potassium: 4.5 mmol/L (ref 3.5–5.2)
Sodium: 142 mmol/L (ref 134–144)
eGFR: 36 mL/min/{1.73_m2} — ABNORMAL LOW (ref >59)

## 2022-07-06 LAB — CBC
Hematocrit: 35.6 % (ref 34.0–46.6)
Hemoglobin: 11.4 g/dL (ref 11.1–15.9)
MCH: 30 pg (ref 26.6–33.0)
MCHC: 32 g/dL (ref 31.5–35.7)
MCV: 94 fL (ref 79–97)
Platelets: 385 10*3/uL (ref 150–450)
RBC: 3.8 x10E6/uL (ref 3.77–5.28)
RDW: 14.9 % (ref 11.7–15.4)
WBC: 6.7 10*3/uL (ref 3.4–10.8)

## 2022-07-10 ENCOUNTER — Other Ambulatory Visit (HOSPITAL_COMMUNITY)
Admission: RE | Admit: 2022-07-10 | Discharge: 2022-07-10 | Disposition: A | Discharge status: Home / Self Care | Payer: Medicare Other | Source: Ambulatory Visit | Attending: Cardiology | Admitting: Cardiology

## 2022-07-10 DIAGNOSIS — I5022 Chronic systolic (congestive) heart failure: Secondary | ICD-10-CM | POA: Diagnosis not present

## 2022-07-10 LAB — DIGOXIN LEVEL: Digoxin Level: 2.3 ng/mL — ABNORMAL HIGH (ref 0.8–2.0)

## 2022-07-11 ENCOUNTER — Telehealth: Payer: Self-pay | Admitting: Internal Medicine

## 2022-07-11 ENCOUNTER — Encounter (HOSPITAL_COMMUNITY): Payer: Self-pay

## 2022-07-11 DIAGNOSIS — G894 Chronic pain syndrome: Secondary | ICD-10-CM | POA: Diagnosis not present

## 2022-07-11 DIAGNOSIS — M545 Low back pain, unspecified: Secondary | ICD-10-CM | POA: Diagnosis not present

## 2022-07-11 NOTE — Pre-Procedure Instructions (Signed)
Instructed patient on the following items: Arrival time 0830 Nothing to eat or drink after midnight Can take AM meds morning of procedure except for Lasix and Spironolactone  Responsible person to drive you home and stay with you for 24 hrs Wash with special soap night before and morning of procedure

## 2022-07-11 NOTE — Telephone Encounter (Signed)
[facility] called to get a RN evaluation for  med teaching. Please contact Rexford Maus [phone]

## 2022-07-11 NOTE — Telephone Encounter (Signed)
Gave verbal orders. 

## 2022-07-12 ENCOUNTER — Encounter (HOSPITAL_COMMUNITY)
Admission: RE | Disposition: A | Discharge status: Home / Self Care | Payer: Self-pay | Source: Home / Self Care | Attending: Internal Medicine

## 2022-07-12 ENCOUNTER — Ambulatory Visit (HOSPITAL_COMMUNITY): Payer: Medicare Other

## 2022-07-12 ENCOUNTER — Ambulatory Visit (HOSPITAL_COMMUNITY)
Admission: RE | Admit: 2022-07-12 | Discharge: 2022-07-12 | Disposition: A | Discharge status: Home / Self Care | Payer: Medicare Other | Attending: Internal Medicine | Admitting: Internal Medicine

## 2022-07-12 DIAGNOSIS — I5022 Chronic systolic (congestive) heart failure: Secondary | ICD-10-CM | POA: Diagnosis not present

## 2022-07-12 DIAGNOSIS — Z87891 Personal history of nicotine dependence: Secondary | ICD-10-CM | POA: Diagnosis not present

## 2022-07-12 DIAGNOSIS — I428 Other cardiomyopathies: Secondary | ICD-10-CM | POA: Diagnosis not present

## 2022-07-12 DIAGNOSIS — I447 Left bundle-branch block, unspecified: Secondary | ICD-10-CM

## 2022-07-12 DIAGNOSIS — I110 Hypertensive heart disease with heart failure: Secondary | ICD-10-CM | POA: Insufficient documentation

## 2022-07-12 DIAGNOSIS — Z950 Presence of cardiac pacemaker: Secondary | ICD-10-CM | POA: Diagnosis not present

## 2022-07-12 HISTORY — PX: BIV PACEMAKER INSERTION CRT-P: EP1199

## 2022-07-12 SURGERY — BIV PACEMAKER INSERTION CRT-P

## 2022-07-12 MED ORDER — CEFAZOLIN SODIUM-DEXTROSE 2-4 GM/100ML-% IV SOLN
INTRAVENOUS | Status: AC
  Filled 2022-07-12: qty 100

## 2022-07-12 MED ORDER — HEPARIN (PORCINE) IN NACL 1000-0.9 UT/500ML-% IV SOLN
INTRAVENOUS | Status: DC | PRN
  Administered 2022-07-12: 500 mL

## 2022-07-12 MED ORDER — LIDOCAINE HCL (PF) 1 % IJ SOLN
INTRAMUSCULAR | Status: AC
  Filled 2022-07-12: qty 60

## 2022-07-12 MED ORDER — SODIUM CHLORIDE 0.9 % IV SOLN: INTRAVENOUS | Status: DC

## 2022-07-12 MED ORDER — FENTANYL CITRATE (PF) 100 MCG/2ML IJ SOLN
INTRAMUSCULAR | Status: DC | PRN
  Administered 2022-07-12 (×3): 12.5 ug via INTRAVENOUS

## 2022-07-12 MED ORDER — MIDAZOLAM HCL 5 MG/5ML IJ SOLN
INTRAMUSCULAR | Status: AC
  Filled 2022-07-12: qty 5

## 2022-07-12 MED ORDER — LIDOCAINE HCL (PF) 1 % IJ SOLN
INTRAMUSCULAR | Status: DC | PRN
  Administered 2022-07-12: 60 mL

## 2022-07-12 MED ORDER — CHLORHEXIDINE GLUCONATE 4 % EX LIQD: 4.0000 | Freq: Once | CUTANEOUS | Status: DC

## 2022-07-12 MED ORDER — ACETAMINOPHEN 325 MG PO TABS
325.0000 mg | ORAL_TABLET | ORAL | Status: DC | PRN
  Administered 2022-07-12: 650 mg via ORAL
  Filled 2022-07-12: qty 2

## 2022-07-12 MED ORDER — FENTANYL CITRATE (PF) 100 MCG/2ML IJ SOLN
INTRAMUSCULAR | Status: AC
  Filled 2022-07-12: qty 2

## 2022-07-12 MED ORDER — SODIUM CHLORIDE 0.9 % IV SOLN
80.0000 mg | INTRAVENOUS | Status: AC
  Administered 2022-07-12: 80 mg

## 2022-07-12 MED ORDER — CEFAZOLIN SODIUM-DEXTROSE 2-4 GM/100ML-% IV SOLN
2.0000 g | INTRAVENOUS | Status: AC
  Administered 2022-07-12: 2 g via INTRAVENOUS

## 2022-07-12 MED ORDER — SODIUM CHLORIDE 0.9 % IV SOLN
INTRAVENOUS | Status: AC
  Filled 2022-07-12: qty 2

## 2022-07-12 MED ORDER — ONDANSETRON HCL 4 MG/2ML IJ SOLN: 4.0000 mg | Freq: Four times a day (QID) | INTRAMUSCULAR | Status: DC | PRN

## 2022-07-12 MED ORDER — MIDAZOLAM HCL 5 MG/5ML IJ SOLN
INTRAMUSCULAR | Status: DC | PRN
  Administered 2022-07-12 (×3): .5 mg via INTRAVENOUS

## 2022-07-12 SURGICAL SUPPLY — 13 items
CABLE SURGICAL S-101-97-12 (CABLE) ×1 IMPLANT
CATH RIGHTSITE C315HIS02 (CATHETERS) IMPLANT
LEAD SELECT SECURE 3830 383069 (Lead) IMPLANT
LEAD ULTIPACE 52 LPA1231/52 (Lead) IMPLANT
LEAD ULTIPACE 58 LPA1231/58 (Lead) IMPLANT
PACEMAKER ALLR CRT-P RF PM3222 (Pacemaker) IMPLANT
PAD DEFIB RADIO PHYSIO CONN (PAD) ×1 IMPLANT
PPM ALLURE CRT-P RF PM3222 (Pacemaker) ×1 IMPLANT
SELECT SECURE 3830 383069 (Lead) ×1 IMPLANT
SHEATH 7FR PRELUDE SNAP 13 (SHEATH) IMPLANT
SLITTER 6232ADJ (MISCELLANEOUS) IMPLANT
TRAY PACEMAKER INSERTION (PACKS) ×1 IMPLANT
WIRE HI TORQ VERSACORE-J 145CM (WIRE) IMPLANT

## 2022-07-12 NOTE — H&P (Signed)
Patient Care Team: [name], MD as PCP - General (Internal Medicine) Marjo Bicker, MD as PCP - Cardiology (Cardiology) Beryle Beams, MD as Consulting Physician (Neurology) Vickki Hearing, MD as Consulting Physician (Orthopedic Surgery)   HPI  Heather [name] is a 82 y.o. female admtted for CRT pacing for congestive heart faiure class 3 and LBBB  Chronic dyspnea but stable, no edema no syncope    DATE TEST EF    8/23 Echo   15-20 %    8/23 LHC    % Nonobstructive CAD  12/23 Echo  <15%    1/24 cMRI 16% Prior MI             Date Cr K Hgb  3/24 1.48 4.0 12.9             Records and Results Reviewed   Past Medical History:  Diagnosis Date   Anemia    Anxiety    Arthritis    COPD (chronic obstructive pulmonary disease) (HCC)    CVA (cerebral vascular accident) (HCC)    Depression    Dry eyes 04/14/2014   GERD (gastroesophageal reflux disease)    Glaucoma    Gout    HTN (hypertension)    OSA (obstructive sleep apnea)     Past Surgical History:  Procedure Laterality Date   ABDOMINAL HYSTERECTOMY     BACK SURGERY     lumbar-disc   BUNIONECTOMY Bilateral    FOOT SURGERY Left    repair of "kissing Cousins"   JOINT REPLACEMENT Bilateral    hip    JOINT REPLACEMENT Right 2010 or 2012   knee   KNEE SURGERY     RIGHT/LEFT HEART CATH AND CORONARY ANGIOGRAPHY N/A 10/27/2021   Procedure: RIGHT/LEFT HEART CATH AND CORONARY ANGIOGRAPHY;  Surgeon: Corky Crafts, MD;  Location: [facility];  Service: Cardiovascular;  Laterality: N/A;   SHOULDER ARTHROSCOPY Right    shoulder surgery in [place] about 2000   SHOULDER HEMI-ARTHROPLASTY Left 03/25/2014   Procedure: LEFT SHOULDER HEMI-ARTHROPLASTY;  Surgeon: Vickki Hearing, MD;  Location: [facility];  Service: Orthopedics;  Laterality: Left;    Current Facility-Administered Medications  Medication Dose Route Frequency Provider Last Rate Last Admin   0.9 %  sodium chloride infusion    Intravenous Continuous Duke Salvia, MD 50 mL/hr at 07/12/22 0849 New Bag at 07/12/22 0849   0.9 %  sodium chloride infusion   Intravenous Continuous Duke Salvia, MD 50 mL/hr at 07/12/22 0849 New Bag at 07/12/22 0849   ceFAZolin (ANCEF) IVPB 2g/100 mL premix  2 g Intravenous On Call Duke Salvia, MD       chlorhexidine (HIBICLENS) 4 % liquid 4 Application  4 Application Topical Once Duke Salvia, MD       gentamicin (GARAMYCIN) 80 mg in sodium chloride 0.9 % 500 mL irrigation  80 mg Irrigation On Call Duke Salvia, MD        Allergies  Allergen Reactions   Lovastatin Swelling    Patient states that her tongue swells and she gets a tingling feeling all over. Patient states that her tongue swells and she gets a tingling feeling all over.   Lisinopril Swelling    Tongue swelling      Social History   Tobacco Use   Smoking status: Former    Packs/day: 1.00    Years: 40.00    Additional pack years: 0.00  Total pack years: 40.00    Types: Cigarettes    Quit date: 03/20/1985    Years since quitting: 37.3   Smokeless tobacco: Never  Vaping Use   Vaping Use: Never used  Substance Use Topics   Alcohol use: No   Drug use: No     Family History  Problem Relation Age of Onset   High blood pressure Mother    Aneurysm Father    Diabetes Brother    Pancreatitis Brother    Hypertension Niece    Congestive Heart Failure Niece      Current Meds  Medication Sig   acetaminophen (TYLENOL) 500 MG tablet Take 500 mg by mouth every 6 (six) hours as needed for mild pain or moderate pain.   allopurinol (ZYLOPRIM) 300 MG tablet TAKE 1/2 TABLET BY MOUTH ONCE DAILY   aspirin (ASPIRIN CHILDRENS) 81 MG chewable tablet Chew 1 tablet (81 mg total) by mouth daily.   atorvastatin (LIPITOR) 20 MG tablet Take 1 tablet (20 mg total) by mouth daily.   bisoprolol (ZEBETA) 5 MG tablet Take 2.5 mg by mouth daily.   chlorpheniramine (CHLOR-TRIMETON) 4 MG tablet Take 4 mg by mouth daily as  needed for allergies.   Cholecalciferol (DIALYVITE VITAMIN D 5000 PO) Take 5,000 Units by mouth daily.   dapagliflozin propanediol (FARXIGA) 10 MG TABS tablet Take 1 tablet (10 mg total) by mouth daily.   digoxin (LANOXIN) 0.125 MG tablet Take 0.5 tablets (0.0625 mg total) by mouth daily.   famotidine (PEPCID) 20 MG tablet Take 1 tablet (20 mg total) by mouth at bedtime. One after supper (Patient taking differently: Take 20 mg by mouth 2 (two) times daily.)   Fluticasone-Umeclidin-Vilant (TRELEGY ELLIPTA) 100-62.5-25 MCG/ACT AEPB Inhale 1 puff into the lungs daily.   furosemide (LASIX) 40 MG tablet Take 60 mg by mouth daily.   hydrALAZINE (APRESOLINE) 25 MG tablet Take 1 tablet (25 mg total) by mouth 3 (three) times daily.   HYDROcodone-acetaminophen (NORCO/VICODIN) 5-325 MG tablet Take 1 tablet by mouth every 6 (six) hours as needed for moderate pain.   isosorbide mononitrate (IMDUR) 30 MG 24 hr tablet Take 1 tablet (30 mg total) by mouth daily.   losartan (COZAAR) 25 MG tablet Take 1 tablet (25 mg total) by mouth daily.   Multiple Vitamins-Minerals (MULTIVITAMIN WITH MINERALS) tablet Take 1 tablet by mouth daily.   PROAIR HFA 108 (90 Base) MCG/ACT inhaler Inhale 2 puffs into the lungs every 6 (six) hours as needed for wheezing or shortness of breath.   Propylene Glycol (SYSTANE COMPLETE) 0.6 % SOLN Place 1 drop into both eyes 2 (two) times daily.   spironolactone (ALDACTONE) 25 MG tablet Take 1 tablet (25 mg total) by mouth daily.   venlafaxine XR (EFFEXOR-XR) 37.5 MG 24 hr capsule Take 2 capsules (75 mg total) by mouth See admin instructions. Take 2 capsules by mouth daily     Review of Systems negative except from HPI and PMH  Physical Exam BP 108/72   Pulse 77   Temp 98 F (36.7 C) (Temporal)   Resp 18   Ht 5' 2.5" (1.588 m)   Wt 61.7 kg   SpO2 97%   BMI 24.48 kg/m  Well developed and well nourished in no acute distress HENT normal E scleral and icterus clear Neck Supple JVP  flat; carotids brisk and full Clear to auscultation with coughing  Regular rate and rhythm, no murmurs gallops or rub Soft with active bowel sounds No clubbing cyanosis  Edema  Alert and oriented, grossly normal motor and sensory function Skin Warm and Dry    Assessment and  Plan  Nonischemic cardiomyopathy   Scarring based on cMRI suggestive of myocardial infarction with nonobstructive disease?  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  The patient understands agrees and is willing to proceed.  Will attempt LBBA pacing if not CRT

## 2022-07-12 NOTE — Discharge Instructions (Signed)
After Your Pacemaker   You have a [employer] Pacemaker  ACTIVITY Do not lift your arm above shoulder height for 1 week after your procedure. After 7 days, you may progress as below.  You should remove your sling 24 hours after your procedure, unless otherwise instructed by your provider.     Wednesday Jul 19, 2022  Thursday Jul 20, 2022 Friday Jul 21, 2022 Saturday Jul 22, 2022   Do not lift, push, pull, or carry anything over 10 pounds with the affected arm until 6 weeks (Wednesday August 23, 2022 ) after your procedure.   You may drive AFTER your wound check, unless you have been told otherwise by your provider.   Ask your healthcare provider when you can go back to work   INCISION/Dressing   If large square, outer bandage is left in place, this can be removed after 24 hours from your procedure. Do not remove glue as below.   If a PRESSURE DRESSING (a bulky dressing that usually goes up over your shoulder) was applied or left in place, please follow instructions given by your provider on when to return to have this removed.   Monitor your Pacemaker site for redness, swelling, and drainage. Call the device clinic at [phone] if you experience these symptoms or fever/chills.  If your incision is closed with Dermabond/Surgical glue. You may shower 1 day after your pacemaker implant and wash around the site with soap and water.    If you were discharged in a sling, please do not wear this during the day more than 48 hours after your surgery unless otherwise instructed. This may increase the risk of stiffness and soreness in your shoulder.   Avoid lotions, ointments, or perfumes over your incision until it is well-healed.  You may use a hot tub or a pool AFTER your wound check appointment if the incision is completely closed.  Pacemaker Alerts:  Some alerts are vibratory and others beep. These are NOT emergencies. Please call our office to let us know. If this occurs at night or on  weekends, it can wait until the next business day. Send a remote transmission.  If your device is capable of reading fluid status (for heart failure), you will be offered monthly monitoring to review this with you.   DEVICE MANAGEMENT Remote monitoring is used to monitor your pacemaker from home. This monitoring is scheduled every 91 days by our office. It allows [place] to keep an eye on the functioning of your device to ensure it is working properly. You will routinely see your Electrophysiologist annually (more often if necessary).   You should receive your ID card for your new device in 4-8 weeks. Keep this card with you at all times once received. Consider wearing a medical alert bracelet or necklace.  Your Pacemaker may be MRI compatible. This will be discussed at your next office visit/wound check.  You should avoid contact with strong electric or magnetic fields.   Do not use amateur (ham) radio equipment or electric (arc) welding torches. MP3 player headphones with magnets should not be used. Some devices are safe to use if held at least 12 inches (30 cm) from your Pacemaker. These include power tools, lawn mowers, and speakers. If you are unsure if something is safe to use, ask your health care provider.  When using your cell phone, hold it to the ear that is on the opposite side from the Pacemaker. Do not leave your cell phone in a pocket over  the Pacemaker.  You may safely use electric blankets, heating pads, computers, and microwave ovens.  Call the office right away if: You have chest pain. You feel more short of breath than you have felt before. You feel more light-headed than you have felt before. Your incision starts to open up.  This information is not intended to replace advice given to you by your health care provider. Make sure you discuss any questions you have with your health care provider.

## 2022-07-13 ENCOUNTER — Telehealth: Payer: Self-pay

## 2022-07-13 ENCOUNTER — Encounter (HOSPITAL_COMMUNITY): Payer: Self-pay | Admitting: Internal Medicine

## 2022-07-13 DIAGNOSIS — N1832 Chronic kidney disease, stage 3b: Secondary | ICD-10-CM | POA: Diagnosis not present

## 2022-07-13 DIAGNOSIS — E785 Hyperlipidemia, unspecified: Secondary | ICD-10-CM | POA: Diagnosis not present

## 2022-07-13 DIAGNOSIS — I272 Pulmonary hypertension, unspecified: Secondary | ICD-10-CM | POA: Diagnosis not present

## 2022-07-13 DIAGNOSIS — I428 Other cardiomyopathies: Secondary | ICD-10-CM | POA: Diagnosis not present

## 2022-07-13 DIAGNOSIS — F419 Anxiety disorder, unspecified: Secondary | ICD-10-CM | POA: Diagnosis not present

## 2022-07-13 DIAGNOSIS — I5023 Acute on chronic systolic (congestive) heart failure: Secondary | ICD-10-CM | POA: Diagnosis not present

## 2022-07-13 DIAGNOSIS — I130 Hypertensive heart and chronic kidney disease with heart failure and stage 1 through stage 4 chronic kidney disease, or unspecified chronic kidney disease: Secondary | ICD-10-CM | POA: Diagnosis not present

## 2022-07-13 DIAGNOSIS — M1990 Unspecified osteoarthritis, unspecified site: Secondary | ICD-10-CM | POA: Diagnosis not present

## 2022-07-13 DIAGNOSIS — I2510 Atherosclerotic heart disease of native coronary artery without angina pectoris: Secondary | ICD-10-CM | POA: Diagnosis not present

## 2022-07-13 DIAGNOSIS — J449 Chronic obstructive pulmonary disease, unspecified: Secondary | ICD-10-CM | POA: Diagnosis not present

## 2022-07-13 DIAGNOSIS — G4733 Obstructive sleep apnea (adult) (pediatric): Secondary | ICD-10-CM | POA: Diagnosis not present

## 2022-07-13 DIAGNOSIS — M1030 Gout due to renal impairment, unspecified site: Secondary | ICD-10-CM | POA: Diagnosis not present

## 2022-07-13 DIAGNOSIS — I5082 Biventricular heart failure: Secondary | ICD-10-CM | POA: Diagnosis not present

## 2022-07-13 DIAGNOSIS — I051 Rheumatic mitral insufficiency: Secondary | ICD-10-CM | POA: Diagnosis not present

## 2022-07-13 DIAGNOSIS — I447 Left bundle-branch block, unspecified: Secondary | ICD-10-CM | POA: Diagnosis not present

## 2022-07-13 DIAGNOSIS — E876 Hypokalemia: Secondary | ICD-10-CM | POA: Diagnosis not present

## 2022-07-13 NOTE — Telephone Encounter (Signed)
-----   Message from Graciella Freer, PA-C sent at 07/12/2022  3:41 PM EDT ----- Regarding: Same Day Discharge PPM 07/12/22 Dr. Graciela Husbands

## 2022-07-13 NOTE — Telephone Encounter (Signed)
Follow-up after same day discharge: Implant date: 07/12/22 MD: Sherryl Manges, MD Device: [facility]. [employer] [address] RF Location: Left Chest   Wound check visit: 07/26/2022 @ 10:00 AM 90 day MD follow-up: 10/19/2022 @ 2:00 PM  Remote Transmission received:Yes  Dressing/sling removed: Yes  Confirm OAC restart on: N/A

## 2022-07-17 ENCOUNTER — Encounter: Payer: Medicare Other | Admitting: Internal Medicine

## 2022-07-17 DIAGNOSIS — I2510 Atherosclerotic heart disease of native coronary artery without angina pectoris: Secondary | ICD-10-CM | POA: Diagnosis not present

## 2022-07-17 DIAGNOSIS — I272 Pulmonary hypertension, unspecified: Secondary | ICD-10-CM | POA: Diagnosis not present

## 2022-07-17 DIAGNOSIS — J449 Chronic obstructive pulmonary disease, unspecified: Secondary | ICD-10-CM | POA: Diagnosis not present

## 2022-07-17 DIAGNOSIS — I051 Rheumatic mitral insufficiency: Secondary | ICD-10-CM | POA: Diagnosis not present

## 2022-07-17 DIAGNOSIS — M1990 Unspecified osteoarthritis, unspecified site: Secondary | ICD-10-CM | POA: Diagnosis not present

## 2022-07-17 DIAGNOSIS — E876 Hypokalemia: Secondary | ICD-10-CM | POA: Diagnosis not present

## 2022-07-17 DIAGNOSIS — F419 Anxiety disorder, unspecified: Secondary | ICD-10-CM | POA: Diagnosis not present

## 2022-07-17 DIAGNOSIS — N1832 Chronic kidney disease, stage 3b: Secondary | ICD-10-CM | POA: Diagnosis not present

## 2022-07-17 DIAGNOSIS — I130 Hypertensive heart and chronic kidney disease with heart failure and stage 1 through stage 4 chronic kidney disease, or unspecified chronic kidney disease: Secondary | ICD-10-CM | POA: Diagnosis not present

## 2022-07-17 DIAGNOSIS — I5023 Acute on chronic systolic (congestive) heart failure: Secondary | ICD-10-CM | POA: Diagnosis not present

## 2022-07-17 DIAGNOSIS — I5082 Biventricular heart failure: Secondary | ICD-10-CM | POA: Diagnosis not present

## 2022-07-17 DIAGNOSIS — I428 Other cardiomyopathies: Secondary | ICD-10-CM | POA: Diagnosis not present

## 2022-07-17 DIAGNOSIS — G4733 Obstructive sleep apnea (adult) (pediatric): Secondary | ICD-10-CM | POA: Diagnosis not present

## 2022-07-17 DIAGNOSIS — I447 Left bundle-branch block, unspecified: Secondary | ICD-10-CM | POA: Diagnosis not present

## 2022-07-17 DIAGNOSIS — E785 Hyperlipidemia, unspecified: Secondary | ICD-10-CM | POA: Diagnosis not present

## 2022-07-17 DIAGNOSIS — M1030 Gout due to renal impairment, unspecified site: Secondary | ICD-10-CM | POA: Diagnosis not present

## 2022-07-19 ENCOUNTER — Other Ambulatory Visit: Payer: Self-pay

## 2022-07-19 DIAGNOSIS — I272 Pulmonary hypertension, unspecified: Secondary | ICD-10-CM | POA: Diagnosis not present

## 2022-07-19 DIAGNOSIS — M1030 Gout due to renal impairment, unspecified site: Secondary | ICD-10-CM | POA: Diagnosis not present

## 2022-07-19 DIAGNOSIS — N1832 Chronic kidney disease, stage 3b: Secondary | ICD-10-CM | POA: Diagnosis not present

## 2022-07-19 DIAGNOSIS — Z950 Presence of cardiac pacemaker: Secondary | ICD-10-CM | POA: Diagnosis not present

## 2022-07-19 DIAGNOSIS — I5082 Biventricular heart failure: Secondary | ICD-10-CM | POA: Diagnosis not present

## 2022-07-19 DIAGNOSIS — I428 Other cardiomyopathies: Secondary | ICD-10-CM | POA: Diagnosis not present

## 2022-07-19 DIAGNOSIS — I2510 Atherosclerotic heart disease of native coronary artery without angina pectoris: Secondary | ICD-10-CM | POA: Diagnosis not present

## 2022-07-19 DIAGNOSIS — I051 Rheumatic mitral insufficiency: Secondary | ICD-10-CM | POA: Diagnosis not present

## 2022-07-19 DIAGNOSIS — Z48812 Encounter for surgical aftercare following surgery on the circulatory system: Secondary | ICD-10-CM | POA: Diagnosis not present

## 2022-07-19 DIAGNOSIS — E785 Hyperlipidemia, unspecified: Secondary | ICD-10-CM | POA: Diagnosis not present

## 2022-07-19 DIAGNOSIS — G4733 Obstructive sleep apnea (adult) (pediatric): Secondary | ICD-10-CM | POA: Diagnosis not present

## 2022-07-19 DIAGNOSIS — J449 Chronic obstructive pulmonary disease, unspecified: Secondary | ICD-10-CM | POA: Diagnosis not present

## 2022-07-19 DIAGNOSIS — M1990 Unspecified osteoarthritis, unspecified site: Secondary | ICD-10-CM | POA: Diagnosis not present

## 2022-07-19 DIAGNOSIS — I130 Hypertensive heart and chronic kidney disease with heart failure and stage 1 through stage 4 chronic kidney disease, or unspecified chronic kidney disease: Secondary | ICD-10-CM | POA: Diagnosis not present

## 2022-07-19 DIAGNOSIS — I5023 Acute on chronic systolic (congestive) heart failure: Secondary | ICD-10-CM | POA: Diagnosis not present

## 2022-07-19 DIAGNOSIS — I447 Left bundle-branch block, unspecified: Secondary | ICD-10-CM | POA: Diagnosis not present

## 2022-07-20 ENCOUNTER — Telehealth: Payer: Self-pay | Admitting: Internal Medicine

## 2022-07-20 DIAGNOSIS — M1030 Gout due to renal impairment, unspecified site: Secondary | ICD-10-CM | POA: Diagnosis not present

## 2022-07-20 DIAGNOSIS — I051 Rheumatic mitral insufficiency: Secondary | ICD-10-CM | POA: Diagnosis not present

## 2022-07-20 DIAGNOSIS — N1832 Chronic kidney disease, stage 3b: Secondary | ICD-10-CM | POA: Diagnosis not present

## 2022-07-20 DIAGNOSIS — M1990 Unspecified osteoarthritis, unspecified site: Secondary | ICD-10-CM | POA: Diagnosis not present

## 2022-07-20 DIAGNOSIS — Z950 Presence of cardiac pacemaker: Secondary | ICD-10-CM | POA: Diagnosis not present

## 2022-07-20 DIAGNOSIS — J449 Chronic obstructive pulmonary disease, unspecified: Secondary | ICD-10-CM | POA: Diagnosis not present

## 2022-07-20 DIAGNOSIS — I428 Other cardiomyopathies: Secondary | ICD-10-CM | POA: Diagnosis not present

## 2022-07-20 DIAGNOSIS — I130 Hypertensive heart and chronic kidney disease with heart failure and stage 1 through stage 4 chronic kidney disease, or unspecified chronic kidney disease: Secondary | ICD-10-CM | POA: Diagnosis not present

## 2022-07-20 DIAGNOSIS — Z48812 Encounter for surgical aftercare following surgery on the circulatory system: Secondary | ICD-10-CM | POA: Diagnosis not present

## 2022-07-20 DIAGNOSIS — G4733 Obstructive sleep apnea (adult) (pediatric): Secondary | ICD-10-CM | POA: Diagnosis not present

## 2022-07-20 DIAGNOSIS — I2510 Atherosclerotic heart disease of native coronary artery without angina pectoris: Secondary | ICD-10-CM | POA: Diagnosis not present

## 2022-07-20 DIAGNOSIS — I5082 Biventricular heart failure: Secondary | ICD-10-CM | POA: Diagnosis not present

## 2022-07-20 DIAGNOSIS — E785 Hyperlipidemia, unspecified: Secondary | ICD-10-CM | POA: Diagnosis not present

## 2022-07-20 DIAGNOSIS — I5023 Acute on chronic systolic (congestive) heart failure: Secondary | ICD-10-CM | POA: Diagnosis not present

## 2022-07-20 DIAGNOSIS — I272 Pulmonary hypertension, unspecified: Secondary | ICD-10-CM | POA: Diagnosis not present

## 2022-07-20 DIAGNOSIS — I447 Left bundle-branch block, unspecified: Secondary | ICD-10-CM | POA: Diagnosis not present

## 2022-07-20 NOTE — Telephone Encounter (Signed)
Boneta Lucks nurse with [facility] needs to get verbal orders for skill nursing for home health 2 week 2, 1 week 7 call back # [phone] can leave last name and credentials  if leave a message.

## 2022-07-20 NOTE — Telephone Encounter (Signed)
Gave verbal orders to [place].

## 2022-07-24 ENCOUNTER — Encounter: Payer: Medicare Other | Admitting: Internal Medicine

## 2022-07-26 ENCOUNTER — Ambulatory Visit: Payer: Medicare Other | Attending: Cardiovascular Disease

## 2022-07-26 ENCOUNTER — Telehealth: Payer: Self-pay

## 2022-07-26 DIAGNOSIS — I447 Left bundle-branch block, unspecified: Secondary | ICD-10-CM | POA: Diagnosis not present

## 2022-07-26 DIAGNOSIS — I420 Dilated cardiomyopathy: Secondary | ICD-10-CM

## 2022-07-26 LAB — CUP PACEART INCLINIC DEVICE CHECK
Battery Remaining Longevity: 49 mo
Battery Voltage: 3.01 V
Brady Statistic RA Percent Paced: 0.1 %
Brady Statistic RV Percent Paced: 88 %
Date Time Interrogation Session: 20240522191205
Implantable Lead Connection Status: 753985
Implantable Lead Connection Status: 753985
Implantable Lead Connection Status: 753985
Implantable Lead Implant Date: 20240508
Implantable Lead Implant Date: 20240508
Implantable Lead Implant Date: 20240508
Implantable Lead Location: 753858
Implantable Lead Location: 753859
Implantable Lead Location: 753860
Implantable Lead Model: 3830
Implantable Pulse Generator Implant Date: 20240508
Lead Channel Impedance Value: 425 Ohm
Lead Channel Impedance Value: 587.5 Ohm
Lead Channel Impedance Value: 612.5 Ohm
Lead Channel Pacing Threshold Amplitude: 0.5 V
Lead Channel Pacing Threshold Amplitude: 0.5 V
Lead Channel Pacing Threshold Amplitude: 0.5 V
Lead Channel Pacing Threshold Amplitude: 0.5 V
Lead Channel Pacing Threshold Amplitude: 0.75 V
Lead Channel Pacing Threshold Amplitude: 0.75 V
Lead Channel Pacing Threshold Pulse Width: 0.5 ms
Lead Channel Pacing Threshold Pulse Width: 0.5 ms
Lead Channel Pacing Threshold Pulse Width: 0.5 ms
Lead Channel Pacing Threshold Pulse Width: 0.5 ms
Lead Channel Pacing Threshold Pulse Width: 0.5 ms
Lead Channel Pacing Threshold Pulse Width: 0.5 ms
Lead Channel Sensing Intrinsic Amplitude: 12 mV
Lead Channel Sensing Intrinsic Amplitude: 2.4 mV
Lead Channel Setting Pacing Amplitude: 3.5 V
Lead Channel Setting Pacing Amplitude: 3.5 V
Lead Channel Setting Pacing Amplitude: 3.5 V
Lead Channel Setting Pacing Pulse Width: 0.5 ms
Lead Channel Setting Pacing Pulse Width: 0.5 ms
Lead Channel Setting Sensing Sensitivity: 2 mV
Pulse Gen Model: 3222
Pulse Gen Serial Number: 8167222

## 2022-07-26 NOTE — Progress Notes (Signed)
Wound check appointment. Steri-strips removed. Wound without redness or edema. Incision edges approximated, wound well healed. Normal device function. Thresholds, sensing, and impedances consistent with implant measurements. Device programmed at 3.5V/auto capture programmed on for extra safety margin until 3 month visit.  AT/AF burden <1%, 136 AMS episodes - majority are false due to oversensing (crosstalk).  Program change made to extend PVAB from to to see if improves false mode switching.  Frequent PVC's noted and ventricular histograms show 60-70% of rates 90-110bpm. BIVP 88%.  Patient reports that she is doing well, no symptoms and is taking all of her medications daily, no missed doses. Patient educated about wound care, arm mobility, lifting restrictions. ROV in 3 months with implanting physician.

## 2022-07-26 NOTE — Patient Instructions (Signed)
    After Your Pacemaker   Monitor your pacemaker site for redness, swelling, and drainage. Call the device clinic at [phone] if you experience these symptoms or fever/chills.  Your incision was closed with Dermabond:  You may shower 1 day after your defibrillator implant and wash your incision with soap and water. Avoid lotions, ointments, or perfumes over your incision until it is well-healed.  You may use a hot tub or a pool after your wound check appointment if the incision is completely closed.  Do not lift, push or pull greater than 10 pounds with the affected arm until 6 weeks after your procedure. UNTIL AFTER JUNE 19TH.  There are no other restrictions in arm movement after your wound check appointment.  You may drive, unless driving has been restricted by your healthcare providers.  Remote monitoring is used to monitor your pacemaker from home. This monitoring is scheduled every 91 days by our office. It allows [place] to keep an eye on the functioning of your device to ensure it is working properly. You will routinely see your Electrophysiologist annually (more often if necessary).

## 2022-07-26 NOTE — Telephone Encounter (Signed)
Patient in today for 14 day post implant (Stjude BIV PPM) wound/device check. Dr. Graciela Husbands implanted on 07/12/2022.   The following observed:   Ventricular histograms show approx 60-70% rates 90-110bpm.  Patient having frequent PVC's (presenting AS/BiVP with PVC's 70-142 rates) Patient is asymptomatic, no missed doses of meds, no changes in diet or health.  136 (majority false) AMS episodes due to oversensing crosstalk. I did extend PVAB from 150-121ms as a result.   Patient has 91 day follow up with Dr. Graciela Husbands - 10/19/22.   Please advise if any changes are needed in the interim.    PRESENTING:         OVERSENSING CROSSTALK: (PVAB INCREASED TO TODAY)

## 2022-07-27 ENCOUNTER — Encounter: Payer: Self-pay | Admitting: Physical Medicine & Rehabilitation

## 2022-07-27 ENCOUNTER — Encounter: Payer: Medicare Other | Attending: Physical Medicine & Rehabilitation | Admitting: Physical Medicine & Rehabilitation

## 2022-07-27 VITALS — BP 95/59 | HR 73 | Ht 62.5 in | Wt 133.0 lb

## 2022-07-27 DIAGNOSIS — Z950 Presence of cardiac pacemaker: Secondary | ICD-10-CM | POA: Diagnosis not present

## 2022-07-27 DIAGNOSIS — Z48812 Encounter for surgical aftercare following surgery on the circulatory system: Secondary | ICD-10-CM | POA: Diagnosis not present

## 2022-07-27 DIAGNOSIS — G894 Chronic pain syndrome: Secondary | ICD-10-CM | POA: Diagnosis not present

## 2022-07-27 DIAGNOSIS — G4733 Obstructive sleep apnea (adult) (pediatric): Secondary | ICD-10-CM | POA: Diagnosis not present

## 2022-07-27 DIAGNOSIS — I428 Other cardiomyopathies: Secondary | ICD-10-CM | POA: Diagnosis not present

## 2022-07-27 DIAGNOSIS — N1832 Chronic kidney disease, stage 3b: Secondary | ICD-10-CM | POA: Diagnosis not present

## 2022-07-27 DIAGNOSIS — I272 Pulmonary hypertension, unspecified: Secondary | ICD-10-CM | POA: Diagnosis not present

## 2022-07-27 DIAGNOSIS — F32A Depression, unspecified: Secondary | ICD-10-CM | POA: Insufficient documentation

## 2022-07-27 DIAGNOSIS — Z79891 Long term (current) use of opiate analgesic: Secondary | ICD-10-CM | POA: Insufficient documentation

## 2022-07-27 DIAGNOSIS — I130 Hypertensive heart and chronic kidney disease with heart failure and stage 1 through stage 4 chronic kidney disease, or unspecified chronic kidney disease: Secondary | ICD-10-CM | POA: Diagnosis not present

## 2022-07-27 DIAGNOSIS — I5082 Biventricular heart failure: Secondary | ICD-10-CM | POA: Diagnosis not present

## 2022-07-27 DIAGNOSIS — G8929 Other chronic pain: Secondary | ICD-10-CM | POA: Diagnosis not present

## 2022-07-27 DIAGNOSIS — I2510 Atherosclerotic heart disease of native coronary artery without angina pectoris: Secondary | ICD-10-CM | POA: Diagnosis not present

## 2022-07-27 DIAGNOSIS — J449 Chronic obstructive pulmonary disease, unspecified: Secondary | ICD-10-CM | POA: Diagnosis not present

## 2022-07-27 DIAGNOSIS — M545 Low back pain, unspecified: Secondary | ICD-10-CM | POA: Diagnosis not present

## 2022-07-27 DIAGNOSIS — M1030 Gout due to renal impairment, unspecified site: Secondary | ICD-10-CM | POA: Diagnosis not present

## 2022-07-27 DIAGNOSIS — I051 Rheumatic mitral insufficiency: Secondary | ICD-10-CM | POA: Diagnosis not present

## 2022-07-27 DIAGNOSIS — I447 Left bundle-branch block, unspecified: Secondary | ICD-10-CM | POA: Diagnosis not present

## 2022-07-27 DIAGNOSIS — M1990 Unspecified osteoarthritis, unspecified site: Secondary | ICD-10-CM | POA: Diagnosis not present

## 2022-07-27 DIAGNOSIS — E785 Hyperlipidemia, unspecified: Secondary | ICD-10-CM | POA: Diagnosis not present

## 2022-07-27 DIAGNOSIS — I5023 Acute on chronic systolic (congestive) heart failure: Secondary | ICD-10-CM | POA: Diagnosis not present

## 2022-07-27 MED ORDER — VENLAFAXINE HCL ER 75 MG PO CP24: 75.0000 mg | ORAL_CAPSULE | ORAL | 5 refills | Status: DC

## 2022-07-27 MED ORDER — HYDROCODONE-ACETAMINOPHEN 5-325 MG PO TABS: 1.0000 | ORAL_TABLET | Freq: Four times a day (QID) | ORAL | 0 refills | Status: DC | PRN

## 2022-07-27 NOTE — Telephone Encounter (Signed)
LM on both home and cell voicemail (ok per DPR). Need to schedule patient for labs and BP check as ordered by R. Keitha Butte, PA-C.

## 2022-07-27 NOTE — Progress Notes (Signed)
Subjective:    Patient ID: Heather [name], female    DOB: [birth year], 82 y.o.   MRN: [id number]  HPI  HPI   Heather [name] is a 82 y.o. year old female  who  has a past medical history of Anemia, Anxiety, Arthritis, COPD (chronic obstructive pulmonary disease) (HCC), CVA (cerebral vascular accident) (HCC), Depression, Dry eyes (04/14/2014), GERD (gastroesophageal reflux disease), Glaucoma, Gout, HTN (hypertension), and OSA (obstructive sleep apnea).   They are presenting to PM&R clinic as a new patient for pain management evaluation. They were referred or treatment of chronic pain.  Heather [name] reports she has had back pain for many years.  She had lumbar surgery in 1992 and her pain was controlled until about 2014.  Most of her pain is in her lower back.  Sometimes the pain shoots down her legs on both sides.  Pain has been stable for many years.  She has been on hydrocodone every 6 hours which helps control the pain.  Pain is not particularly changed with activity.  She also has history of right rotator cuff repair.  She has had 2 total hip replacements.       Medications tried: Tylenol- helps slightly  Ibuprofen- stomach issues, can't take NSAIDs Vanlaflaxine - helps her pain Gabapentin- made her itch  Lyrica- not sure if she used this Tramadol- didn't help enough Hydrocodone 5 mg helps the pain.  She uses this about 4 times a day.     Other treatments: PT/OT  - Helped briefly but then had CHF and had to stop exercise and it got worse, she likes riding a bicicyle  ESI didn't help Has not tried TENS unit Facet blocks ? Many years ago Ice and heat helped the pain.     Interval History  05/26/22 Heather [name] is here for follow-up of her chronic lower back pain.  Pain will sometimes shoot down her legs.  Hydrocodone continues to help keep her pain under control.  This allows her to do more around the house and be more active.  She is not having any side effects with the hydrocodone.   Patient reports she has received her TENS unit, however due to plan for pacemaker she plans to check with cardiology before using it.  Interval History  07/27/22 Heather [name] is here to follow-up on her chronic pain.  Her worst pain continues to be in her lower back.  Venlafaxine and hydrocodone continue to help her pain and she is not having any significant side effects with these medications. She is not using her TENS unit, says this is due to cardiology recommendation.  She recently had an ICD device implanted by cardiology. She is trying to use her exercise bike to increase her activity levels.   Pain Inventory Average Pain 5 Pain Right Now 5 My pain is intermittent and aching  In the last 24 hours, has pain interfered with the following? General activity 0 Relation with others 0 Enjoyment of life 0 What TIME of day is your pain at its worst? varies Sleep (in general) Poor  Pain is worse with: sitting and some activites Pain improves with: therapy/exercise, pacing activities, and medication Relief from Meds: 8  Family History  Problem Relation Age of Onset   High blood pressure Mother    Aneurysm Father    Diabetes Brother    Pancreatitis Brother    Hypertension Niece    Congestive Heart Failure Niece    Social History   Socioeconomic  History   Marital status: Divorced    Spouse name: Not on file   Number of children: Not on file   Years of education: Not on file   Highest education level: Not on file  Occupational History   Not on file  Tobacco Use   Smoking status: Former    Packs/day: 1.00    Years: 40.00    Additional pack years: 0.00    Total pack years: 40.00    Types: Cigarettes    Quit date: 03/20/1985    Years since quitting: 37.3   Smokeless tobacco: Never  Vaping Use   Vaping Use: Never used  Substance and Sexual Activity   Alcohol use: No   Drug use: No   Sexual activity: Yes    Birth control/protection: Surgical  Other Topics Concern   Not on  file  Social History Narrative   Heather [name] is a 82 year old divorced female who lives alone. She has support of her son and sister.    Social Determinants of Health   Financial Resource Strain: High Risk (11/08/2021)   Overall Financial Resource Strain (CARDIA)    Difficulty of Paying Living Expenses: Very hard  Food Insecurity: No Food Insecurity (03/13/2022)   Hunger Vital Sign    Worried About Running Out of Food in the Last Year: Never true    Ran Out of Food in the Last Year: Never true  Transportation Needs: Unmet Transportation Needs (03/15/2022)   PRAPARE - Transportation    Lack of Transportation (Medical): No    Lack of Transportation (Non-Medical): Yes  Physical Activity: Sufficiently Active (11/08/2021)   Exercise Vital Sign    Days of Exercise per Week: 7 days    Minutes of Exercise per Session: 40 min  Stress: No Stress Concern Present (11/08/2021)   [employer] of Occupational Health - Occupational Stress Questionnaire    Feeling of Stress : Not at all  Social Connections: Moderately Isolated (11/08/2021)   Social Connection and Isolation Panel [NHANES]    Frequency of Communication with Friends and Family: Twice a week    Frequency of Social Gatherings with Friends and Family: Once a week    Attends Religious Services: More than 4 times per year    Active Member of [employer] or Organizations: No    Attends Banker Meetings: Never    Marital Status: Widowed   Past Surgical History:  Procedure Laterality Date   ABDOMINAL HYSTERECTOMY     BACK SURGERY     lumbar-disc   BIV PACEMAKER INSERTION CRT-P N/A 07/12/2022   Procedure: BIV PACEMAKER INSERTION CRT-P;  Surgeon: Duke Salvia, MD;  Location: [facility] INVASIVE CV LAB;  Service: Cardiovascular;  Laterality: N/A;   BUNIONECTOMY Bilateral    FOOT SURGERY Left    repair of "kissing Cousins"   JOINT REPLACEMENT Bilateral    hip    JOINT REPLACEMENT Right 2010 or 2012   knee   KNEE SURGERY     RIGHT/LEFT HEART  CATH AND CORONARY ANGIOGRAPHY N/A 10/27/2021   Procedure: RIGHT/LEFT HEART CATH AND CORONARY ANGIOGRAPHY;  Surgeon: Corky Crafts, MD;  Location: [facility] INVASIVE CV LAB;  Service: Cardiovascular;  Laterality: N/A;   SHOULDER ARTHROSCOPY Right    shoulder surgery in [place] about 2000   SHOULDER HEMI-ARTHROPLASTY Left 03/25/2014   Procedure: LEFT SHOULDER HEMI-ARTHROPLASTY;  Surgeon: Vickki Hearing, MD;  Location: [facility];  Service: Orthopedics;  Laterality: Left;   Past Surgical History:  Procedure Laterality Date  ABDOMINAL HYSTERECTOMY     BACK SURGERY     lumbar-disc   BIV PACEMAKER INSERTION CRT-P N/A 07/12/2022   Procedure: BIV PACEMAKER INSERTION CRT-P;  Surgeon: Duke Salvia, MD;  Location: [facility] INVASIVE CV LAB;  Service: Cardiovascular;  Laterality: N/A;   BUNIONECTOMY Bilateral    FOOT SURGERY Left    repair of "kissing Cousins"   JOINT REPLACEMENT Bilateral    hip    JOINT REPLACEMENT Right 2010 or 2012   knee   KNEE SURGERY     RIGHT/LEFT HEART CATH AND CORONARY ANGIOGRAPHY N/A 10/27/2021   Procedure: RIGHT/LEFT HEART CATH AND CORONARY ANGIOGRAPHY;  Surgeon: Corky Crafts, MD;  Location: [facility] INVASIVE CV LAB;  Service: Cardiovascular;  Laterality: N/A;   SHOULDER ARTHROSCOPY Right    shoulder surgery in [place] about 2000   SHOULDER HEMI-ARTHROPLASTY Left 03/25/2014   Procedure: LEFT SHOULDER HEMI-ARTHROPLASTY;  Surgeon: Vickki Hearing, MD;  Location: [facility];  Service: Orthopedics;  Laterality: Left;   Past Medical History:  Diagnosis Date   Anemia    Anxiety    Arthritis    COPD (chronic obstructive pulmonary disease) (HCC)    CVA (cerebral vascular accident) (HCC)    Depression    Dry eyes 04/14/2014   GERD (gastroesophageal reflux disease)    Glaucoma    Gout    HTN (hypertension)    OSA (obstructive sleep apnea)    BP (!) 95/59   Pulse 73   Ht 5' 2.5" (1.588 m)   Wt 133 lb (60.3 kg)   SpO2 97%   BMI 23.94 kg/m   Opioid Risk Score:   Fall  Risk Score:  `1  Depression screen [facility] 2/9     05/26/2022    3:20 PM 05/26/2022   11:05 AM 04/03/2022   10:33 AM 02/02/2022    9:54 AM 11/08/2021    2:26 PM 11/08/2021    2:25 PM 11/03/2021    2:18 PM  Depression screen PHQ 2/9  Decreased Interest 0 0 1 0 0 0 0  Down, Depressed, Hopeless 0 0 0 0 0 0 0  PHQ - 2 Score 0 0 1 0 0 0 0  Altered sleeping 2  2 0     Tired, decreased energy 0  1 2     Change in appetite 2  1 1      Feeling bad or failure about yourself  0  1 0     Trouble concentrating 0  0 0     Moving slowly or fidgety/restless 1  1 0     Suicidal thoughts 0  0 0     PHQ-9 Score 5  7 3      Difficult doing work/chores   Not difficult at all          Review of Systems  Constitutional:  Positive for unexpected weight change.  Respiratory:  Positive for cough, shortness of breath and wheezing.   Musculoskeletal:  Positive for back pain and gait problem.  All other systems reviewed and are negative.      Objective:   Physical Exam   Gen: no distress, normal appearing HEENT: oral mucosa pink and moist, NCAT Chest: normal effort, normal rate of breathing Abd: soft, non-distended Ext: no edema Psych: pleasant, normal affect Skin: intact Neuro: Alert and oriented, follows commands, cranial nerves II through XII grossly intact, normal speech and language Strength 5 out of 5 in all 4 extremities Sensation intact light touch in all 4 extremities  No coordination deficits noted Musculoskeletal:  Slump test negative bilaterally Facet loading negative Lumbar paraspinal muscle tenderness on exam   10/06/16 IMPRESSION: 1. 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 This cyst partially effaces the thecal sac and impinges on the exiting T12 nerve root. 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 Further description above. 2. Lower lumbar facet arthropathy with grade 1 anterolisthesis at L4-5 and L5-S1. 3. 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 4. Prior L5-S1 decompressive laminectomy with patent thecal sac. 5. Prominent iliopsoas atrophy bilaterally. Status post bilateral hip replacement.     Assessment & Plan:  Assessment and Plan: Heather [name] is a 82 y.o. year old female  who  has a past medical history of Anemia, Anxiety, Arthritis, COPD (chronic obstructive pulmonary disease) (HCC), CVA (cerebral vascular accident) (HCC), Depression, Dry eyes (04/14/2014), GERD (gastroesophageal reflux disease), Glaucoma, Gout, HTN (hypertension), and OSA (obstructive sleep apnea).   They are presenting to PM&R clinic as a new patient for treatment of chronic back pain.      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 Shirlee Latch and Karle Plumber  due QT   Depression Well controlled with vanlfexine per patient, continue

## 2022-07-28 NOTE — Telephone Encounter (Signed)
Attempted to reach patient at both numbers available, left voicemail to call device clinic back.

## 2022-08-01 ENCOUNTER — Other Ambulatory Visit: Payer: Self-pay

## 2022-08-01 ENCOUNTER — Ambulatory Visit: Payer: Medicare Other | Attending: Cardiology

## 2022-08-01 ENCOUNTER — Other Ambulatory Visit (HOSPITAL_COMMUNITY)
Admission: RE | Admit: 2022-08-01 | Discharge: 2022-08-01 | Disposition: A | Discharge status: Home / Self Care | Payer: Medicare Other | Source: Ambulatory Visit | Attending: Physician Assistant | Admitting: Physician Assistant

## 2022-08-01 ENCOUNTER — Telehealth: Payer: Self-pay | Admitting: Pulmonary Disease

## 2022-08-01 DIAGNOSIS — I5022 Chronic systolic (congestive) heart failure: Secondary | ICD-10-CM | POA: Diagnosis not present

## 2022-08-01 DIAGNOSIS — Z79899 Other long term (current) drug therapy: Secondary | ICD-10-CM | POA: Diagnosis not present

## 2022-08-01 LAB — BASIC METABOLIC PANEL
Anion gap: 14 (ref 5–15)
BUN: 46 mg/dL — ABNORMAL HIGH (ref 8–23)
CO2: 22 mmol/L (ref 22–32)
Calcium: 9.7 mg/dL (ref 8.9–10.3)
Chloride: 102 mmol/L (ref 98–111)
Creatinine, Ser: 1.39 mg/dL — ABNORMAL HIGH (ref 0.44–1.00)
GFR, Estimated: 38 mL/min — ABNORMAL LOW (ref >60)
Glucose, Bld: 102 mg/dL — ABNORMAL HIGH (ref 70–99)
Potassium: 4.6 mmol/L (ref 3.5–5.1)
Sodium: 138 mmol/L (ref 135–145)

## 2022-08-01 LAB — MAGNESIUM: Magnesium: 2.3 mg/dL (ref 1.7–2.4)

## 2022-08-01 NOTE — Telephone Encounter (Signed)
Okay to provide her with samples of trelegy 200 one puff daily.  Can give her 2 to 3 samples, depending on how much we have available.

## 2022-08-01 NOTE — Telephone Encounter (Signed)
Call back received from Pt.  Pt lives in [place], prefers her BP/labs in [place] if possible.  Pt scheduled for nurse visit in [place] for this morning with labs.  Will continue to follow.

## 2022-08-01 NOTE — Telephone Encounter (Signed)
Patient requesting an alternative inhaler than Trelegy because she is in the doughnut hole and cannot afford it. Please advise on inhaler/ and trelegy samples.

## 2022-08-01 NOTE — Progress Notes (Signed)
Nurse visit for BP check   Labs BMET, Magnesium to  be done at [facility] today   Patient will await call back from office.

## 2022-08-01 NOTE — Patient Instructions (Addendum)
Get your lab work done now at [facility]   The Cardiology office will call you medication instructions.

## 2022-08-03 NOTE — Telephone Encounter (Signed)
Read notes from Dr. Craige Cotta and advised to visit the [facility] office for free samples.

## 2022-08-04 ENCOUNTER — Other Ambulatory Visit: Payer: Self-pay

## 2022-08-04 ENCOUNTER — Telehealth: Payer: Self-pay

## 2022-08-04 DIAGNOSIS — Z48812 Encounter for surgical aftercare following surgery on the circulatory system: Secondary | ICD-10-CM | POA: Diagnosis not present

## 2022-08-04 DIAGNOSIS — I428 Other cardiomyopathies: Secondary | ICD-10-CM | POA: Diagnosis not present

## 2022-08-04 DIAGNOSIS — I051 Rheumatic mitral insufficiency: Secondary | ICD-10-CM | POA: Diagnosis not present

## 2022-08-04 DIAGNOSIS — I272 Pulmonary hypertension, unspecified: Secondary | ICD-10-CM | POA: Diagnosis not present

## 2022-08-04 DIAGNOSIS — I447 Left bundle-branch block, unspecified: Secondary | ICD-10-CM | POA: Diagnosis not present

## 2022-08-04 DIAGNOSIS — I5082 Biventricular heart failure: Secondary | ICD-10-CM | POA: Diagnosis not present

## 2022-08-04 DIAGNOSIS — I5023 Acute on chronic systolic (congestive) heart failure: Secondary | ICD-10-CM | POA: Diagnosis not present

## 2022-08-04 DIAGNOSIS — Z950 Presence of cardiac pacemaker: Secondary | ICD-10-CM | POA: Diagnosis not present

## 2022-08-04 DIAGNOSIS — M1030 Gout due to renal impairment, unspecified site: Secondary | ICD-10-CM | POA: Diagnosis not present

## 2022-08-04 DIAGNOSIS — G4733 Obstructive sleep apnea (adult) (pediatric): Secondary | ICD-10-CM | POA: Diagnosis not present

## 2022-08-04 DIAGNOSIS — I130 Hypertensive heart and chronic kidney disease with heart failure and stage 1 through stage 4 chronic kidney disease, or unspecified chronic kidney disease: Secondary | ICD-10-CM | POA: Diagnosis not present

## 2022-08-04 DIAGNOSIS — E785 Hyperlipidemia, unspecified: Secondary | ICD-10-CM | POA: Diagnosis not present

## 2022-08-04 DIAGNOSIS — I2510 Atherosclerotic heart disease of native coronary artery without angina pectoris: Secondary | ICD-10-CM | POA: Diagnosis not present

## 2022-08-04 DIAGNOSIS — M1990 Unspecified osteoarthritis, unspecified site: Secondary | ICD-10-CM | POA: Diagnosis not present

## 2022-08-04 DIAGNOSIS — N1832 Chronic kidney disease, stage 3b: Secondary | ICD-10-CM | POA: Diagnosis not present

## 2022-08-04 DIAGNOSIS — J449 Chronic obstructive pulmonary disease, unspecified: Secondary | ICD-10-CM | POA: Diagnosis not present

## 2022-08-04 MED ORDER — TRELEGY ELLIPTA 200-62.5-25 MCG/ACT IN AEPB: 1.0000 | INHALATION_SPRAY | Freq: Every day | RESPIRATORY_TRACT | 0 refills | Status: DC

## 2022-08-04 NOTE — Progress Notes (Signed)
Opened encounter to chart samples

## 2022-08-04 NOTE — Telephone Encounter (Signed)
Transmission received. Presenting rhythm only one PVC noted. Per diagnostics summary:  PVC burden is 9.2%.   Current BP percentage is 85%. Pt is programmed DDD 60. Pt has follow up with Heart Failure clinic August 17, 2022.

## 2022-08-04 NOTE — Telephone Encounter (Signed)
3 samples of Trelegy 200 were provided to pt, ok per VS. NFN att

## 2022-08-04 NOTE — Telephone Encounter (Signed)
Outreach made to Pt.  She is currently not home, but her son was there and requested she send a remote transmission when she gets home.  Pt with recent BP check in clinic as requested with result of 114/60.  Per review of Pt history, Pt BP runs on the low side.  Will await transmission and follow up.

## 2022-08-04 NOTE — Telephone Encounter (Signed)
-----   Message from Sheilah Pigeon, [place] sent at 08/03/2022  7:17 PM EDT ----- Labs. Electrolytes are stable, cane get a transmission to see if she is still having frequent PVCs/how her BP% is?

## 2022-08-10 DIAGNOSIS — I130 Hypertensive heart and chronic kidney disease with heart failure and stage 1 through stage 4 chronic kidney disease, or unspecified chronic kidney disease: Secondary | ICD-10-CM | POA: Diagnosis not present

## 2022-08-10 DIAGNOSIS — G4733 Obstructive sleep apnea (adult) (pediatric): Secondary | ICD-10-CM | POA: Diagnosis not present

## 2022-08-10 DIAGNOSIS — I272 Pulmonary hypertension, unspecified: Secondary | ICD-10-CM | POA: Diagnosis not present

## 2022-08-10 DIAGNOSIS — I2510 Atherosclerotic heart disease of native coronary artery without angina pectoris: Secondary | ICD-10-CM | POA: Diagnosis not present

## 2022-08-10 DIAGNOSIS — Z48812 Encounter for surgical aftercare following surgery on the circulatory system: Secondary | ICD-10-CM | POA: Diagnosis not present

## 2022-08-10 DIAGNOSIS — M1990 Unspecified osteoarthritis, unspecified site: Secondary | ICD-10-CM | POA: Diagnosis not present

## 2022-08-10 DIAGNOSIS — I5023 Acute on chronic systolic (congestive) heart failure: Secondary | ICD-10-CM | POA: Diagnosis not present

## 2022-08-10 DIAGNOSIS — Z950 Presence of cardiac pacemaker: Secondary | ICD-10-CM | POA: Diagnosis not present

## 2022-08-10 DIAGNOSIS — I428 Other cardiomyopathies: Secondary | ICD-10-CM | POA: Diagnosis not present

## 2022-08-10 DIAGNOSIS — I051 Rheumatic mitral insufficiency: Secondary | ICD-10-CM | POA: Diagnosis not present

## 2022-08-10 DIAGNOSIS — I447 Left bundle-branch block, unspecified: Secondary | ICD-10-CM | POA: Diagnosis not present

## 2022-08-10 DIAGNOSIS — I5082 Biventricular heart failure: Secondary | ICD-10-CM | POA: Diagnosis not present

## 2022-08-10 DIAGNOSIS — J449 Chronic obstructive pulmonary disease, unspecified: Secondary | ICD-10-CM | POA: Diagnosis not present

## 2022-08-10 DIAGNOSIS — N1832 Chronic kidney disease, stage 3b: Secondary | ICD-10-CM | POA: Diagnosis not present

## 2022-08-10 DIAGNOSIS — M1030 Gout due to renal impairment, unspecified site: Secondary | ICD-10-CM | POA: Diagnosis not present

## 2022-08-10 DIAGNOSIS — E785 Hyperlipidemia, unspecified: Secondary | ICD-10-CM | POA: Diagnosis not present

## 2022-08-17 ENCOUNTER — Ambulatory Visit (HOSPITAL_COMMUNITY)
Admission: RE | Admit: 2022-08-17 | Discharge: 2022-08-17 | Disposition: A | Discharge status: Home / Self Care | Payer: Medicare Other | Source: Ambulatory Visit | Attending: Cardiology | Admitting: Cardiology

## 2022-08-17 ENCOUNTER — Encounter (HOSPITAL_COMMUNITY): Payer: Self-pay

## 2022-08-17 VITALS — BP 104/52 | HR 64 | Ht 62.5 in | Wt 136.0 lb

## 2022-08-17 DIAGNOSIS — J449 Chronic obstructive pulmonary disease, unspecified: Secondary | ICD-10-CM | POA: Diagnosis not present

## 2022-08-17 DIAGNOSIS — Z79899 Other long term (current) drug therapy: Secondary | ICD-10-CM | POA: Insufficient documentation

## 2022-08-17 DIAGNOSIS — Z7984 Long term (current) use of oral hypoglycemic drugs: Secondary | ICD-10-CM | POA: Diagnosis not present

## 2022-08-17 DIAGNOSIS — Z7982 Long term (current) use of aspirin: Secondary | ICD-10-CM | POA: Diagnosis not present

## 2022-08-17 DIAGNOSIS — I130 Hypertensive heart and chronic kidney disease with heart failure and stage 1 through stage 4 chronic kidney disease, or unspecified chronic kidney disease: Secondary | ICD-10-CM | POA: Insufficient documentation

## 2022-08-17 DIAGNOSIS — Z8673 Personal history of transient ischemic attack (TIA), and cerebral infarction without residual deficits: Secondary | ICD-10-CM | POA: Insufficient documentation

## 2022-08-17 DIAGNOSIS — I5082 Biventricular heart failure: Secondary | ICD-10-CM | POA: Diagnosis not present

## 2022-08-17 DIAGNOSIS — I5022 Chronic systolic (congestive) heart failure: Secondary | ICD-10-CM | POA: Insufficient documentation

## 2022-08-17 DIAGNOSIS — G4733 Obstructive sleep apnea (adult) (pediatric): Secondary | ICD-10-CM | POA: Insufficient documentation

## 2022-08-17 DIAGNOSIS — Z87891 Personal history of nicotine dependence: Secondary | ICD-10-CM | POA: Insufficient documentation

## 2022-08-17 DIAGNOSIS — I272 Pulmonary hypertension, unspecified: Secondary | ICD-10-CM | POA: Diagnosis not present

## 2022-08-17 DIAGNOSIS — Z5986 Financial insecurity: Secondary | ICD-10-CM | POA: Diagnosis not present

## 2022-08-17 DIAGNOSIS — I428 Other cardiomyopathies: Secondary | ICD-10-CM | POA: Diagnosis not present

## 2022-08-17 DIAGNOSIS — E785 Hyperlipidemia, unspecified: Secondary | ICD-10-CM | POA: Diagnosis not present

## 2022-08-17 DIAGNOSIS — N1832 Chronic kidney disease, stage 3b: Secondary | ICD-10-CM | POA: Insufficient documentation

## 2022-08-17 DIAGNOSIS — I252 Old myocardial infarction: Secondary | ICD-10-CM | POA: Diagnosis not present

## 2022-08-17 DIAGNOSIS — I2510 Atherosclerotic heart disease of native coronary artery without angina pectoris: Secondary | ICD-10-CM | POA: Diagnosis not present

## 2022-08-17 DIAGNOSIS — I447 Left bundle-branch block, unspecified: Secondary | ICD-10-CM | POA: Diagnosis not present

## 2022-08-17 LAB — COMPREHENSIVE METABOLIC PANEL
ALT: 19 U/L (ref 0–44)
AST: 28 U/L (ref 15–41)
Albumin: 3.6 g/dL (ref 3.5–5.0)
Alkaline Phosphatase: 75 U/L (ref 38–126)
Anion gap: 13 (ref 5–15)
BUN: 62 mg/dL — ABNORMAL HIGH (ref 8–23)
CO2: 23 mmol/L (ref 22–32)
Calcium: 9.3 mg/dL (ref 8.9–10.3)
Chloride: 99 mmol/L (ref 98–111)
Creatinine, Ser: 1.6 mg/dL — ABNORMAL HIGH (ref 0.44–1.00)
GFR, Estimated: 32 mL/min — ABNORMAL LOW (ref >60)
Glucose, Bld: 74 mg/dL (ref 70–99)
Potassium: 4.2 mmol/L (ref 3.5–5.1)
Sodium: 135 mmol/L (ref 135–145)
Total Bilirubin: 0.5 mg/dL (ref 0.3–1.2)
Total Protein: 6.8 g/dL (ref 6.5–8.1)

## 2022-08-17 LAB — DIGOXIN LEVEL: Digoxin Level: 1.1 ng/mL (ref 0.8–2.0)

## 2022-08-17 LAB — LIPID PANEL
Cholesterol: 172 mg/dL (ref 0–200)
HDL: 82 mg/dL (ref >40)
LDL Cholesterol: 81 mg/dL (ref 0–99)
Total CHOL/HDL Ratio: 2.1 RATIO
Triglycerides: 45 mg/dL (ref <150)
VLDL: 9 mg/dL (ref 0–40)

## 2022-08-17 NOTE — Patient Instructions (Addendum)
   Lab Work:  Labs today We will only contact you if something comes back abnormal or we need to make some changes. Otherwise no news is good news!   Testing/Procedures: Your physician has requested that you have an echocardiogram. Echocardiography is a painless test that uses sound waves to create images of your heart. It provides your doctor with information about the size and shape of your heart and how well your heart's chambers and valves are working. This procedure takes approximately one hour. There are no restrictions for this procedure. Please do NOT wear cologne, perfume, aftershave, or lotions (deodorant is allowed). Please arrive 15 minutes prior to your appointment time.     Follow-Up in: 2-3 months Dr. Shirlee Latch  At the [facility], you and your health needs are our priority. We have a designated team specialized in the treatment of Heart Failure. This Care Team includes your primary Heart Failure Specialized Cardiologist (physician), Advanced Practice Providers (APPs- Physician Assistants and Nurse Practitioners), and Pharmacist who all work together to provide you with the care you need, when you need it.   You may see any of the following providers on your designated Care Team at your next follow up:  Dr. Arvilla Meres Dr. Marca Ancona Dr. Marcos Eke, NP Robbie Lis, [place] [facility], [place] Brynda Peon, NP Karle Plumber, PharmD   Please be sure to bring in all your medications bottles to every appointment.   Need to Contact us:  If you have any questions or concerns before your next appointment please send [place] a message through [place] or call our office at [phone].    TO LEAVE A MESSAGE FOR THE NURSE SELECT OPTION 2, PLEASE LEAVE A MESSAGE INCLUDING: YOUR NAME DATE OF BIRTH CALL BACK NUMBER REASON FOR CALL**this is important as we prioritize the call backs  YOU WILL RECEIVE A CALL BACK THE SAME DAY  AS LONG AS YOU CALL BEFORE 4:00 PM

## 2022-08-17 NOTE — Progress Notes (Signed)
ADVANCED HF CLINIC NOTE  Primary Care: [name], MD HF Cardiologist: Dr. Shirlee Latch  HPI: 82 y.o. [name] w/ h/o CKD IIIb, LBBB, R cerebellar CVA and chronic biventricular heart failure first diagnosed 10/2021. Echo then showed severely reduced LVEF 15-20%, RV mildly reduced, mild-mod MR. Echo also showed dyssynchrony c/w LBBB. Of note, prior echo in 2017 showed normal LVEF w/ septal dyssynchrony and normal RV.   She underwent [facility] 10/2021 which showed mild nonobstructive CAD. 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 GDMT regimen included Farxiga, Imdur, Bisoprolol. Diuretic regimen Lasix 20 mg daily.    Echo 12/23 showed EF still severely reduced < 15%, progression of RV dysfunction (mod reduced) and severely elevated PA pressure w/ estimated RVSP 68 mmHg.     Admitted 1/24 with CHF exacerbation. Started on lasix gtt, and placed on empiric milrinone to augment diuresis. GDMT held w/ soft BP. cMRI showed LVEF 16%, mid-apical inferolateral subendocardial LGE, and moderately dilated RV with RVEF 23%. Milrinone was weaned and GDMT titrated. She was discharged home with HH, weight 150 lbs.  Echo 4/24 showed EF <20% with moderate LV enlargement, septal-lateral dyssynchrony, moderate RV dysfunction, PASP 65 mmHg. She was referred to EP and underwent CRT-P 5/24.  She returns back today for f/u. Reports doing well. Feels better since getting PPM. Improved exercise tolerance. Able to do her ADLs w/o much dyspnea. Wt stable at home. No LEE. Compliant w/ meds. Tolerating well. BP soft 104/52 but no orthostatic symptoms. Denies CP.  Off note, her digoxin level was elevated last visit and instructions were to contact pt to instruct to stop digoxin but she says that she didn't get the message and still taking 0.0625 dose. As noted above, no symptoms/side effects.   ECG (personally reviewed): Atrial-sensed V paced 66 pbm   Labs (1/24):  K 4.0, creatinine 1.33, hgb 12.9 Labs (3/24): K 4.4, creatinine 1.58, LDL 82 Labs (5/24): K 4.6, creatinine 1.39, Digoxin 2.3   PMH: 1. Chronic systolic CHF: Nonischemic cardiomyopathy. Chronic LBBB.  - Angioedema with ACEI. - R/LHC (8/23): mild, non-obstructive CAD; RA mean 8, PA 54/18 (mean 34), PCWP 18, CO/CI (Fick) 3.87/2.24  - Echo (12/23): EF < 15%, moderately reduced RV function, severely elevated PA pressure (RVSP 68 mmHg) - Cardiac MRI (1/24): LVEF 16%, RVEF 23%, mid-apical inferolateral LGE in prior infarction pattern (looks like prior MI but does not explain extent of CMP).  - Echo (4/24): EF <20% with moderate LV enlargement, septal-lateral dyssynchrony, moderate RV dysfunction, PASP 65 mmHg.  2. COPD: Prior smoker.  3. CKD stage 3 4. CVA: prior cerebellar CVA.  5. HTN 6. OSA  Current Outpatient Medications  Medication Sig Dispense Refill   acetaminophen (TYLENOL) 500 MG tablet Take 500 mg by mouth every 6 (six) hours as needed for mild pain or moderate pain.     allopurinol (ZYLOPRIM) 300 MG tablet TAKE 1/2 TABLET BY MOUTH ONCE DAILY 45 tablet 0   aspirin (ASPIRIN CHILDRENS) 81 MG chewable tablet Chew 1 tablet (81 mg total) by mouth daily. 90 tablet 3   atorvastatin (LIPITOR) 20 MG tablet Take 1 tablet (20 mg total) by mouth daily. 90 tablet 3   bisoprolol (ZEBETA) 5 MG tablet Take 2.5 mg by mouth daily.     chlorpheniramine (CHLOR-TRIMETON) 4 MG tablet Take 4 mg by mouth daily as needed for allergies.     Cholecalciferol (DIALYVITE VITAMIN D 5000 PO) Take 5,000  Units by mouth daily.     dapagliflozin propanediol (FARXIGA) 10 MG TABS tablet Take 1 tablet (10 mg total) by mouth daily. 30 tablet 10   digoxin (LANOXIN) 0.125 MG tablet Take 0.5 tablets (0.0625 mg total) by mouth daily. 90 tablet 3   famotidine (PEPCID) 20 MG tablet Take 1 tablet (20 mg total) by mouth at bedtime. One after supper (Patient taking differently: Take 20 mg by mouth 2 (two) times daily.) 30 tablet 3    Fluticasone-Umeclidin-Vilant (TRELEGY ELLIPTA) 100-62.5-25 MCG/ACT AEPB Inhale 1 puff into the lungs daily. 60 each 11   Fluticasone-Umeclidin-Vilant (TRELEGY ELLIPTA) 200-62.5-25 MCG/ACT AEPB Inhale 1 puff into the lungs daily. 1 each 0   furosemide (LASIX) 40 MG tablet Take 60 mg by mouth daily.     hydrALAZINE (APRESOLINE) 25 MG tablet Take 1 tablet (25 mg total) by mouth 3 (three) times daily. 180 tablet 3   HYDROcodone-acetaminophen (NORCO/VICODIN) 5-325 MG tablet Take 1 tablet by mouth every 6 (six) hours as needed for moderate pain. 120 tablet 0   isosorbide mononitrate (IMDUR) 30 MG 24 hr tablet Take 1 tablet (30 mg total) by mouth daily. 90 tablet 3   ketoconazole (NIZORAL) 2 % cream Apply 1 Application topically as needed for irritation.     losartan (COZAAR) 25 MG tablet Take 1 tablet (25 mg total) by mouth daily. 90 tablet 3   Multiple Vitamins-Minerals (MULTIVITAMIN WITH MINERALS) tablet Take 1 tablet by mouth daily.     PROAIR HFA 108 (90 Base) MCG/ACT inhaler Inhale 2 puffs into the lungs every 6 (six) hours as needed for wheezing or shortness of breath. 18 g 2   Propylene Glycol (SYSTANE COMPLETE) 0.6 % SOLN Place 1 drop into both eyes 2 (two) times daily.     spironolactone (ALDACTONE) 25 MG tablet Take 1 tablet (25 mg total) by mouth daily. 30 tablet 5   venlafaxine XR (EFFEXOR-XR) 75 MG 24 hr capsule Take 1 capsule (75 mg total) by mouth See admin instructions. 30 capsule 5   No current facility-administered medications for this encounter.   Allergies  Allergen Reactions   Lovastatin Swelling    Patient states that her tongue swells and she gets a tingling feeling all over. Patient states that her tongue swells and she gets a tingling feeling all over.   Lisinopril Swelling    Tongue swelling   Social History   Socioeconomic History   Marital status: Divorced    Spouse name: Not on file   Number of children: Not on file   Years of education: Not on file   Highest  education level: Not on file  Occupational History   Not on file  Tobacco Use   Smoking status: Former    Packs/day: 1.00    Years: 40.00    Additional pack years: 0.00    Total pack years: 40.00    Types: Cigarettes    Quit date: 03/20/1985    Years since quitting: 37.4   Smokeless tobacco: Never  Vaping Use   Vaping Use: Never used  Substance and Sexual Activity   Alcohol use: No   Drug use: No   Sexual activity: Yes    Birth control/protection: Surgical  Other Topics Concern   Not on file  Social History Narrative   Mrs Delbridge is a 82 year old divorced female who lives alone. She has support of her son and sister.    Social Determinants of Health   Financial Resource Strain: High Risk (11/08/2021)  Overall Financial Resource Strain (CARDIA)    Difficulty of Paying Living Expenses: Very hard  Food Insecurity: No Food Insecurity (03/13/2022)   Hunger Vital Sign    Worried About Running Out of Food in the Last Year: Never true    Ran Out of Food in the Last Year: Never true  Transportation Needs: Unmet Transportation Needs (03/15/2022)   PRAPARE - Transportation    Lack of Transportation (Medical): No    Lack of Transportation (Non-Medical): Yes  Physical Activity: Sufficiently Active (11/08/2021)   Exercise Vital Sign    Days of Exercise per Week: 7 days    Minutes of Exercise per Session: 40 min  Stress: No Stress Concern Present (11/08/2021)   [employer] of Occupational Health - Occupational Stress Questionnaire    Feeling of Stress : Not at all  Social Connections: Moderately Isolated (11/08/2021)   Social Connection and Isolation Panel [NHANES]    Frequency of Communication with Friends and Family: Twice a week    Frequency of Social Gatherings with Friends and Family: Once a week    Attends Religious Services: More than 4 times per year    Active Member of [employer] or Organizations: No    Attends Banker Meetings: Never    Marital Status: Widowed   Intimate Partner Violence: Not At Risk (03/13/2022)   Humiliation, Afraid, Rape, and Kick questionnaire    Fear of Current or Ex-Partner: No    Emotionally Abused: No    Physically Abused: No    Sexually Abused: No   Family History  Problem Relation Age of Onset   High blood pressure Mother    Aneurysm Father    Diabetes Brother    Pancreatitis Brother    Hypertension Niece    Congestive Heart Failure Niece    Wt Readings from Last 3 Encounters:  08/17/22 61.7 kg (136 lb)  08/01/22 59.4 kg (131 lb)  07/27/22 60.3 kg (133 lb)   BP (!) 104/52   Pulse 64   Ht 5' 2.5" (1.588 m)   Wt 61.7 kg (136 lb)   SpO2 96%   BMI 24.48 kg/m   PHYSICAL EXAM: General:  Well appearing eldelry [name]. No respiratory difficulty HEENT: normal Neck: supple. no JVD. Carotids 2+ bilat; no bruits. No lymphadenopathy or thyromegaly appreciated. Cor: PMI nondisplaced. Regular rate & rhythm. No rubs, gallops or murmurs. Lungs: clear Abdomen: soft, nontender, nondistended. No hepatosplenomegaly. No bruits or masses. Good bowel sounds. Extremities: no cyanosis, clubbing, rash, edema Neuro: alert & oriented x 3, cranial nerves grossly intact. moves all 4 extremities w/o difficulty. Affect pleasant.   ASSESSMENT & PLAN: 1. Chronic systolic CHF: Nonischemic cardiomyopathy. Cath in 8/23 with no significant CAD, CI 2.24. ?LBBB cardiomyopathy versus familial versus prior myocarditis. Echo in 12/23 with EF < 20%, moderately reduced RV function, D-shaped septum, mild-moderate MR. She was admitted 1/24 with volume overload, empirically started on milrinone 0.25 and Lasix gtt.  Cardiac MRI showed LV severely dilated with EF 16% and septal-lateral dyssynchrony, RV moderately dilated with EF 23%, mid-apical inferolateral LGE in prior infarction pattern. However, CAD does not explain the extent of her cardiomyopathy based on cath. Most recent echo 5/23 showed EF <20% with moderate LV enlargement, septal-lateral  dyssynchrony, moderate RV dysfunction, PASP 65 mmHg. Underwent CRT-P 5/24. Reports improved symptoms since pacer placed. She is NYHA Class II. Volume ok. Grossly euvolemic on exam.  - Continue bisoprolol 2.5 mg daily. - Continue Lasix 60 mg daily.  - Continue spironolactone 25  mg daily.  - On digoxin 0.0625. Check dig level today, if elevated will need to discontinued  - Continue Farxiga 10 mg daily. Denies GU symptoms  - Continue losartan 25 mg daily (angioedema with lisinopril so no Entresto). - Continue hydralazine 25 mg tid - Continue Imdur 30 mg daily  - Repeat Limited Echo next visit to see if any improvement in LV w/ Bi-V pacing  - Check BMP and BNP today  2. CAD: mild nonobstructive disease by cath 8/23, 25% mRCA plaque. No chest pain.  - continue ASA + statin  3. HLD: on statin for CAD and h/o CVA. LDL Goal < 70 - check FLP and HFTs today  4. CKD stage 3: Baseline creatinine around 1.5. - Check CMP today  5. H/o right cerebellar CVA: On ASA 81 and statin.  6. OSA: Encouraged CPAP use. 7. LBBB: dyssynchrony on prior echos, suspect contributory to CM. S/p CRT-P 5/24 - followed by Dr. Graciela Husbands  - repeat limited echo next visit    Follow up in 2-3 months with Dr. Shirlee Latch w/ repeat Limited Echo.    Robbie Lis, PA-C  08/17/22

## 2022-08-18 ENCOUNTER — Ambulatory Visit: Payer: Medicare Other | Attending: Internal Medicine | Admitting: Internal Medicine

## 2022-08-18 ENCOUNTER — Encounter: Payer: Self-pay | Admitting: Internal Medicine

## 2022-08-18 VITALS — BP 108/62 | HR 70 | Ht 62.0 in | Wt 133.0 lb

## 2022-08-18 DIAGNOSIS — Z48812 Encounter for surgical aftercare following surgery on the circulatory system: Secondary | ICD-10-CM | POA: Diagnosis not present

## 2022-08-18 DIAGNOSIS — I447 Left bundle-branch block, unspecified: Secondary | ICD-10-CM | POA: Diagnosis not present

## 2022-08-18 DIAGNOSIS — I428 Other cardiomyopathies: Secondary | ICD-10-CM

## 2022-08-18 DIAGNOSIS — I50810 Right heart failure, unspecified: Secondary | ICD-10-CM | POA: Diagnosis not present

## 2022-08-18 DIAGNOSIS — N1832 Chronic kidney disease, stage 3b: Secondary | ICD-10-CM | POA: Diagnosis not present

## 2022-08-18 DIAGNOSIS — I501 Left ventricular failure: Secondary | ICD-10-CM | POA: Diagnosis not present

## 2022-08-18 DIAGNOSIS — M1990 Unspecified osteoarthritis, unspecified site: Secondary | ICD-10-CM | POA: Diagnosis not present

## 2022-08-18 DIAGNOSIS — I051 Rheumatic mitral insufficiency: Secondary | ICD-10-CM | POA: Diagnosis not present

## 2022-08-18 DIAGNOSIS — J449 Chronic obstructive pulmonary disease, unspecified: Secondary | ICD-10-CM | POA: Diagnosis not present

## 2022-08-18 DIAGNOSIS — E785 Hyperlipidemia, unspecified: Secondary | ICD-10-CM | POA: Diagnosis not present

## 2022-08-18 DIAGNOSIS — I2510 Atherosclerotic heart disease of native coronary artery without angina pectoris: Secondary | ICD-10-CM | POA: Diagnosis not present

## 2022-08-18 DIAGNOSIS — G4733 Obstructive sleep apnea (adult) (pediatric): Secondary | ICD-10-CM | POA: Diagnosis not present

## 2022-08-18 DIAGNOSIS — I130 Hypertensive heart and chronic kidney disease with heart failure and stage 1 through stage 4 chronic kidney disease, or unspecified chronic kidney disease: Secondary | ICD-10-CM | POA: Diagnosis not present

## 2022-08-18 DIAGNOSIS — Z950 Presence of cardiac pacemaker: Secondary | ICD-10-CM | POA: Diagnosis not present

## 2022-08-18 DIAGNOSIS — I5082 Biventricular heart failure: Secondary | ICD-10-CM | POA: Diagnosis not present

## 2022-08-18 DIAGNOSIS — I5023 Acute on chronic systolic (congestive) heart failure: Secondary | ICD-10-CM | POA: Diagnosis not present

## 2022-08-18 DIAGNOSIS — I272 Pulmonary hypertension, unspecified: Secondary | ICD-10-CM | POA: Diagnosis not present

## 2022-08-18 DIAGNOSIS — M1030 Gout due to renal impairment, unspecified site: Secondary | ICD-10-CM | POA: Diagnosis not present

## 2022-08-18 NOTE — Patient Instructions (Signed)
Medication Instructions:  Your physician recommends that you continue on your current medications as directed. Please refer to the Current Medication list given to you today.   Labwork: None today  Testing/Procedures: None today  Follow-Up: As needed with Dr.Mallipeddi   Continue to see [facility]  Any Other Special Instructions Will Be Listed Below (If Applicable).   You have been referred to Cardiac rehab here at [facility]. They will call you to schedule appointment.   If you need a refill on your cardiac medications before your next appointment, please call your pharmacy.

## 2022-08-18 NOTE — Progress Notes (Signed)
Cardiology Office Note  Date: 08/18/2022   ID: Heather [name], DOB 10-26-1940, MRN [id number]  PCP:  [name], MD  Cardiologist:  Marjo Bicker, MD Electrophysiologist:  None   Reason for Office Visit: F/u severe biventricular dysfunction   History of Present Illness: Heather [name] is a 82 y.o. female known to have NICM with severe BiV dysfunction s/p CRT-D, history of CVA, COPD, HTN, OSA presented to cardiology clinic for follow-up visit.  Patient was admitted to [facility] in 03/2022 with a diagnosis of acute systolic heart failure exacerbation, stage D and had to be transferred to [facility] for inotropic support. She underwent cardiac MRI which showed severely dilated LV with diffuse hypokinesis and septal -lateral dyssynchrony, EF 16%, moderately dilated RV with EF 23%, mid to apical inferolateral LGE in the pattern of prior infarction (subendocardial) and elevated ECV percentage suggesting increased myocardial fibrosis but does not suggest amyloidosis. She is here for follow-up visit.   Denies any SOB, chest pain, dizziness, syncope, lightheadedness and leg swelling. 142 lbs in 05/2022 but now weighed 131 lbs (home) today.   Past Medical History:  Diagnosis Date   Anemia    Anxiety    Arthritis    COPD (chronic obstructive pulmonary disease) (HCC)    CVA (cerebral vascular accident) (HCC)    Depression    Dry eyes 04/14/2014   GERD (gastroesophageal reflux disease)    Glaucoma    Gout    HTN (hypertension)    OSA (obstructive sleep apnea)     Past Surgical History:  Procedure Laterality Date   ABDOMINAL HYSTERECTOMY     BACK SURGERY     lumbar-disc   BIV PACEMAKER INSERTION CRT-P N/A 07/12/2022   Procedure: BIV PACEMAKER INSERTION CRT-P;  Surgeon: Duke Salvia, MD;  Location: [facility] INVASIVE CV LAB;  Service: Cardiovascular;  Laterality: N/A;   BUNIONECTOMY Bilateral    FOOT SURGERY Left    repair of "kissing Cousins"   JOINT  REPLACEMENT Bilateral    hip    JOINT REPLACEMENT Right 2010 or 2012   knee   KNEE SURGERY     RIGHT/LEFT HEART CATH AND CORONARY ANGIOGRAPHY N/A 10/27/2021   Procedure: RIGHT/LEFT HEART CATH AND CORONARY ANGIOGRAPHY;  Surgeon: Corky Crafts, MD;  Location: [facility] INVASIVE CV LAB;  Service: Cardiovascular;  Laterality: N/A;   SHOULDER ARTHROSCOPY Right    shoulder surgery in [place] about 2000   SHOULDER HEMI-ARTHROPLASTY Left 03/25/2014   Procedure: LEFT SHOULDER HEMI-ARTHROPLASTY;  Surgeon: Vickki Hearing, MD;  Location: [facility] ORS;  Service: Orthopedics;  Laterality: Left;    Current Outpatient Medications  Medication Sig Dispense Refill   acetaminophen (TYLENOL) 500 MG tablet Take 500 mg by mouth every 6 (six) hours as needed for mild pain or moderate pain.     allopurinol (ZYLOPRIM) 300 MG tablet TAKE 1/2 TABLET BY MOUTH ONCE DAILY 45 tablet 0   aspirin (ASPIRIN CHILDRENS) 81 MG chewable tablet Chew 1 tablet (81 mg total) by mouth daily. 90 tablet 3   atorvastatin (LIPITOR) 20 MG tablet Take 1 tablet (20 mg total) by mouth daily. 90 tablet 3   bisoprolol (ZEBETA) 5 MG tablet Take 2.5 mg by mouth daily.     chlorpheniramine (CHLOR-TRIMETON) 4 MG tablet Take 4 mg by mouth daily as needed for allergies.     Cholecalciferol (DIALYVITE VITAMIN D 5000 PO) Take 5,000 Units by mouth daily.     dapagliflozin propanediol (FARXIGA) 10 MG TABS  tablet Take 1 tablet (10 mg total) by mouth daily. 30 tablet 10   digoxin (LANOXIN) 0.125 MG tablet Take 0.5 tablets (0.0625 mg total) by mouth daily. 90 tablet 3   famotidine (PEPCID) 20 MG tablet Take 1 tablet (20 mg total) by mouth at bedtime. One after supper (Patient taking differently: Take 20 mg by mouth 2 (two) times daily.) 30 tablet 3   Fluticasone-Umeclidin-Vilant (TRELEGY ELLIPTA) 100-62.5-25 MCG/ACT AEPB Inhale 1 puff into the lungs daily. 60 each 11   Fluticasone-Umeclidin-Vilant (TRELEGY ELLIPTA) 200-62.5-25 MCG/ACT AEPB Inhale 1 puff into  the lungs daily. 1 each 0   furosemide (LASIX) 40 MG tablet Take 60 mg by mouth daily.     hydrALAZINE (APRESOLINE) 25 MG tablet Take 1 tablet (25 mg total) by mouth 3 (three) times daily. 180 tablet 3   HYDROcodone-acetaminophen (NORCO/VICODIN) 5-325 MG tablet Take 1 tablet by mouth every 6 (six) hours as needed for moderate pain. 120 tablet 0   isosorbide mononitrate (IMDUR) 30 MG 24 hr tablet Take 1 tablet (30 mg total) by mouth daily. 90 tablet 3   ketoconazole (NIZORAL) 2 % cream Apply 1 Application topically as needed for irritation.     losartan (COZAAR) 25 MG tablet Take 1 tablet (25 mg total) by mouth daily. 90 tablet 3   Multiple Vitamins-Minerals (MULTIVITAMIN WITH MINERALS) tablet Take 1 tablet by mouth daily.     PROAIR HFA 108 (90 Base) MCG/ACT inhaler Inhale 2 puffs into the lungs every 6 (six) hours as needed for wheezing or shortness of breath. 18 g 2   Propylene Glycol (SYSTANE COMPLETE) 0.6 % SOLN Place 1 drop into both eyes 2 (two) times daily.     spironolactone (ALDACTONE) 25 MG tablet Take 1 tablet (25 mg total) by mouth daily. 30 tablet 5   venlafaxine XR (EFFEXOR-XR) 75 MG 24 hr capsule Take 1 capsule (75 mg total) by mouth See admin instructions. 30 capsule 5   No current facility-administered medications for this visit.   Allergies:  Lovastatin and Lisinopril   Social History: The patient  reports that she quit smoking about 37 years ago. Her smoking use included cigarettes. She has a 40.00 pack-year smoking history. She has never used smokeless tobacco. She reports that she does not drink alcohol and does not use drugs.   Family History: The patient's family history includes Aneurysm in her father; Congestive Heart Failure in her niece; Diabetes in her brother; High blood pressure in her mother; Hypertension in her niece; Pancreatitis in her brother.   ROS:  Please see the history of present illness. Otherwise, complete review of systems is positive for none.  All  other systems are reviewed and negative.   Physical Exam: VS:  There were no vitals taken for this visit., BMI There is no height or weight on file to calculate BMI.  Wt Readings from Last 3 Encounters:  08/17/22 136 lb (61.7 kg)  08/01/22 131 lb (59.4 kg)  07/27/22 133 lb (60.3 kg)    General: Patient appears comfortable at rest. HEENT: Conjunctiva and lids normal, oropharynx clear with moist mucosa. Neck: Supple, no elevated JVP or carotid bruits, no thyromegaly. Lungs: Clear to auscultation, nonlabored breathing at rest. Cardiac: Regular rate and rhythm, no S3 or significant systolic murmur, no pericardial rub. Abdomen: Soft, nontender, no hepatomegaly, bowel sounds present, no guarding or rebound. Extremities: No pitting edema, distal pulses 2+. Skin: Warm and dry. Musculoskeletal: No kyphosis. Neuropsychiatric: Alert and oriented x3, affect grossly appropriate.  Recent  Labwork: 05/26/2022: TSH 1.320 07/03/2022: B Natriuretic Peptide 1,741.4 07/05/2022: Hemoglobin 11.4; Platelets 385 08/01/2022: Magnesium 2.3 08/17/2022: ALT 19; AST 28; BUN 62; Creatinine, Ser 1.60; Potassium 4.2; Sodium 135     Component Value Date/Time   CHOL 172 08/17/2022 1044   CHOL 159 05/26/2022 1546   TRIG 45 08/17/2022 1044   HDL 82 08/17/2022 1044   HDL 65 05/26/2022 1546   CHOLHDL 2.1 08/17/2022 1044   VLDL 9 08/17/2022 1044   LDLCALC 81 08/17/2022 1044   LDLCALC 82 05/26/2022 1546    Assessment and Plan: Patient is a 82 y/o F known to have NICM with LVEF less than 20% and RV systolic dysfunction, RVEF 23% with no device, history of CVA, COPD, HTN, OSA presented to cardiology clinic for follow-up visit.  # NICM severe BiV dysfunction 20% s/p CRT-D # Chronic systolic heart failure s/p CRT-D -142 lbs in 05/2022 but today weighed 133 lbs. -Continue p.o. Lasix 60 mg once daily -Bisoprolol was restarted by advanced HF 2.5 mg once daily -Continue losartan 25 mg once daily -Continue hydralazine 20 mg  TID and Imdur 30 mg once daily (Imdur added by advanced HF) -Continue digoxin 0.0625 mg once daily (added by advanced HF) -Continue Farxiga 10 mg once daily -ER precautions with SOB provided -Now that patient has CRT-D device in place, TENS is contraindicated. -Follow-up with advanced heart failure.  Will see her back when she is discharged from the advanced heart failure clinic. -Referral to cardiac rehab for chronic systolic heart failure  # HTN, controlled -Continue above medications  # OSA on CPAP -Continue CPAP  # Hx of CVA -Continue aspirin 81 mg once daily and atorvastatin 10 mg nightly  I have spent a total of 33 minutes with patient reviewing chart , telemetry, EKGs, labs and examining patient as well as establishing an assessment and plan that was discussed with the patient.  > 50% of time was spent in direct patient care.     Medication Adjustments/Labs and Tests Ordered: Current medicines are reviewed at length with the patient today.  Concerns regarding medicines are outlined above.   Tests Ordered: No orders of the defined types were placed in this encounter.   Medication Changes: No orders of the defined types were placed in this encounter.   Disposition:  Follow up  PRN. Continue to follow-up with advanced HF.  Signed, Herbert Deaner, MD, 08/18/2022 1:07 PM    [facility] [address] [place], [place] [address]

## 2022-08-22 ENCOUNTER — Telehealth: Payer: Self-pay

## 2022-08-22 DIAGNOSIS — Z950 Presence of cardiac pacemaker: Secondary | ICD-10-CM | POA: Diagnosis not present

## 2022-08-22 DIAGNOSIS — I051 Rheumatic mitral insufficiency: Secondary | ICD-10-CM | POA: Diagnosis not present

## 2022-08-22 DIAGNOSIS — G4733 Obstructive sleep apnea (adult) (pediatric): Secondary | ICD-10-CM | POA: Diagnosis not present

## 2022-08-22 DIAGNOSIS — M1990 Unspecified osteoarthritis, unspecified site: Secondary | ICD-10-CM | POA: Diagnosis not present

## 2022-08-22 DIAGNOSIS — N1832 Chronic kidney disease, stage 3b: Secondary | ICD-10-CM | POA: Diagnosis not present

## 2022-08-22 DIAGNOSIS — I5082 Biventricular heart failure: Secondary | ICD-10-CM | POA: Diagnosis not present

## 2022-08-22 DIAGNOSIS — I447 Left bundle-branch block, unspecified: Secondary | ICD-10-CM | POA: Diagnosis not present

## 2022-08-22 DIAGNOSIS — I2510 Atherosclerotic heart disease of native coronary artery without angina pectoris: Secondary | ICD-10-CM | POA: Diagnosis not present

## 2022-08-22 DIAGNOSIS — M1030 Gout due to renal impairment, unspecified site: Secondary | ICD-10-CM | POA: Diagnosis not present

## 2022-08-22 DIAGNOSIS — E785 Hyperlipidemia, unspecified: Secondary | ICD-10-CM | POA: Diagnosis not present

## 2022-08-22 DIAGNOSIS — I272 Pulmonary hypertension, unspecified: Secondary | ICD-10-CM | POA: Diagnosis not present

## 2022-08-22 DIAGNOSIS — I130 Hypertensive heart and chronic kidney disease with heart failure and stage 1 through stage 4 chronic kidney disease, or unspecified chronic kidney disease: Secondary | ICD-10-CM | POA: Diagnosis not present

## 2022-08-22 DIAGNOSIS — I428 Other cardiomyopathies: Secondary | ICD-10-CM | POA: Diagnosis not present

## 2022-08-22 DIAGNOSIS — Z48812 Encounter for surgical aftercare following surgery on the circulatory system: Secondary | ICD-10-CM | POA: Diagnosis not present

## 2022-08-22 DIAGNOSIS — J449 Chronic obstructive pulmonary disease, unspecified: Secondary | ICD-10-CM | POA: Diagnosis not present

## 2022-08-22 DIAGNOSIS — I5023 Acute on chronic systolic (congestive) heart failure: Secondary | ICD-10-CM | POA: Diagnosis not present

## 2022-08-22 NOTE — Telephone Encounter (Signed)
Patient states that she is having headaches, pain level 7. Taken tylenol and not helping. BP 90/60. She is wondering if one of her medications is causing the headaches.

## 2022-08-29 DIAGNOSIS — I130 Hypertensive heart and chronic kidney disease with heart failure and stage 1 through stage 4 chronic kidney disease, or unspecified chronic kidney disease: Secondary | ICD-10-CM | POA: Diagnosis not present

## 2022-08-29 DIAGNOSIS — I428 Other cardiomyopathies: Secondary | ICD-10-CM | POA: Diagnosis not present

## 2022-08-29 DIAGNOSIS — I447 Left bundle-branch block, unspecified: Secondary | ICD-10-CM | POA: Diagnosis not present

## 2022-08-29 DIAGNOSIS — M1990 Unspecified osteoarthritis, unspecified site: Secondary | ICD-10-CM | POA: Diagnosis not present

## 2022-08-29 DIAGNOSIS — Z950 Presence of cardiac pacemaker: Secondary | ICD-10-CM | POA: Diagnosis not present

## 2022-08-29 DIAGNOSIS — I2510 Atherosclerotic heart disease of native coronary artery without angina pectoris: Secondary | ICD-10-CM | POA: Diagnosis not present

## 2022-08-29 DIAGNOSIS — N1832 Chronic kidney disease, stage 3b: Secondary | ICD-10-CM | POA: Diagnosis not present

## 2022-08-29 DIAGNOSIS — I5023 Acute on chronic systolic (congestive) heart failure: Secondary | ICD-10-CM | POA: Diagnosis not present

## 2022-08-29 DIAGNOSIS — G4733 Obstructive sleep apnea (adult) (pediatric): Secondary | ICD-10-CM | POA: Diagnosis not present

## 2022-08-29 DIAGNOSIS — E785 Hyperlipidemia, unspecified: Secondary | ICD-10-CM | POA: Diagnosis not present

## 2022-08-29 DIAGNOSIS — M1030 Gout due to renal impairment, unspecified site: Secondary | ICD-10-CM | POA: Diagnosis not present

## 2022-08-29 DIAGNOSIS — I051 Rheumatic mitral insufficiency: Secondary | ICD-10-CM | POA: Diagnosis not present

## 2022-08-29 DIAGNOSIS — J449 Chronic obstructive pulmonary disease, unspecified: Secondary | ICD-10-CM | POA: Diagnosis not present

## 2022-08-29 DIAGNOSIS — I5082 Biventricular heart failure: Secondary | ICD-10-CM | POA: Diagnosis not present

## 2022-08-29 DIAGNOSIS — I272 Pulmonary hypertension, unspecified: Secondary | ICD-10-CM | POA: Diagnosis not present

## 2022-08-29 DIAGNOSIS — Z48812 Encounter for surgical aftercare following surgery on the circulatory system: Secondary | ICD-10-CM | POA: Diagnosis not present

## 2022-08-29 NOTE — Telephone Encounter (Signed)
Pt.notified

## 2022-08-31 ENCOUNTER — Ambulatory Visit (INDEPENDENT_AMBULATORY_CARE_PROVIDER_SITE_OTHER): Payer: Medicare Other | Admitting: Internal Medicine

## 2022-08-31 ENCOUNTER — Encounter: Payer: Self-pay | Admitting: Internal Medicine

## 2022-08-31 VITALS — BP 102/58 | HR 79 | Ht 62.5 in | Wt 136.0 lb

## 2022-08-31 DIAGNOSIS — J449 Chronic obstructive pulmonary disease, unspecified: Secondary | ICD-10-CM

## 2022-08-31 DIAGNOSIS — Z1231 Encounter for screening mammogram for malignant neoplasm of breast: Secondary | ICD-10-CM

## 2022-08-31 DIAGNOSIS — E1122 Type 2 diabetes mellitus with diabetic chronic kidney disease: Secondary | ICD-10-CM | POA: Diagnosis not present

## 2022-08-31 DIAGNOSIS — I10 Essential (primary) hypertension: Secondary | ICD-10-CM | POA: Diagnosis not present

## 2022-08-31 DIAGNOSIS — I5022 Chronic systolic (congestive) heart failure: Secondary | ICD-10-CM | POA: Diagnosis not present

## 2022-08-31 DIAGNOSIS — M545 Low back pain, unspecified: Secondary | ICD-10-CM

## 2022-08-31 DIAGNOSIS — N1832 Chronic kidney disease, stage 3b: Secondary | ICD-10-CM

## 2022-08-31 DIAGNOSIS — N1831 Chronic kidney disease, stage 3a: Secondary | ICD-10-CM

## 2022-08-31 DIAGNOSIS — G8929 Other chronic pain: Secondary | ICD-10-CM

## 2022-08-31 DIAGNOSIS — G4733 Obstructive sleep apnea (adult) (pediatric): Secondary | ICD-10-CM

## 2022-08-31 NOTE — Assessment & Plan Note (Signed)
Recent diagnosis.  Meeting criteria with A1c 6.5 in March.  She is currently prescribed Farxiga. -Urine microalbumin/creatinine ratio ordered today -Diabetic foot exam completed today -Recently evaluated at my eye doctor-[facility]

## 2022-08-31 NOTE — Assessment & Plan Note (Signed)
She has recently established care with a new pain management provider.  Previously followed by Dr. Gerilyn Pilgrim.  Currently prescribed hydrocodone 5 mg 4 times daily and venlafaxine.  Pain is currently well-controlled.

## 2022-08-31 NOTE — Assessment & Plan Note (Signed)
Repeat screening mammogram ordered today.  

## 2022-08-31 NOTE — Patient Instructions (Signed)
It was a pleasure to see you today.  Thank you for giving [place] the opportunity to be involved in your care.  Below is a brief recap of your visit and next steps.  We will plan to see you again in 3 months.  Summary No medication changes today Check urine and foot exam completed Follow up in 3 months

## 2022-08-31 NOTE — Assessment & Plan Note (Signed)
Euvolemic on exam today.  S/p CRT-D implantation on 5/8.  Last seen by cardiology for follow-up on 6/14.

## 2022-08-31 NOTE — Assessment & Plan Note (Signed)
Remains adequately controlled on current antihypertensive regimen.

## 2022-08-31 NOTE — Assessment & Plan Note (Signed)
Remains asymptomatic.  Pulmonary exam unremarkable.  Followed by pulmonology (Dr. Craige Cotta).  Continues to use Trelegy.

## 2022-08-31 NOTE — Progress Notes (Signed)
Established Patient Office Visit  Subjective   Patient ID: Heather Heather [name], female    DOB: Nov 06, 1940  Age: 82 y.o. MRN: [id number]  Chief Complaint  Patient presents with   COPD    Follow up   Insomnia    Not able to sleep. Tried otc meds also tried amiben in the past and wasn't able to tolerate it.   Ms. Heather Heather [name] returns to care today for routine follow-up.  She was last evaluated by me on 3/22.  No medication changes were made at that time.  In the interim she has undergone CRT-D implantation.  She has also establish care with pain management.  Seen by cardiology for follow-up on 6/14.  Ms. Heather Heather [name] reports seeing well today.  Her acute concern is insomnia, noting that she has difficulty both falling and staying asleep.  She has previously been prescribed Ambien and trazodone, but both medications were discontinued due to adverse side effects.  She is interested in additional recommendations today.  Past Medical History:  Diagnosis Date   Anemia    Anxiety    Arthritis    COPD (chronic obstructive pulmonary disease) (HCC)    CVA (cerebral vascular accident) (HCC)    Depression    Dry eyes 04/14/2014   GERD (gastroesophageal reflux disease)    Glaucoma    Gout    HTN (hypertension)    OSA (obstructive sleep apnea)    Past Surgical History:  Procedure Laterality Date   ABDOMINAL HYSTERECTOMY     BACK SURGERY     lumbar-disc   BIV PACEMAKER INSERTION CRT-P N/A 07/12/2022   Procedure: BIV PACEMAKER INSERTION CRT-P;  Surgeon: Duke Salvia, MD;  Location: [facility] INVASIVE CV LAB;  Service: Cardiovascular;  Laterality: N/A;   BUNIONECTOMY Bilateral    FOOT SURGERY Left    repair of "kissing Cousins"   JOINT REPLACEMENT Bilateral    hip    JOINT REPLACEMENT Right 2010 or 2012   knee   KNEE SURGERY     RIGHT/LEFT HEART CATH AND CORONARY ANGIOGRAPHY N/A 10/27/2021   Procedure: RIGHT/LEFT HEART CATH AND CORONARY ANGIOGRAPHY;  Surgeon: Corky Crafts, MD;  Location: [facility] INVASIVE CV  LAB;  Service: Cardiovascular;  Laterality: N/A;   SHOULDER ARTHROSCOPY Right    shoulder surgery in [place] about 2000   SHOULDER HEMI-ARTHROPLASTY Left 03/25/2014   Procedure: LEFT SHOULDER HEMI-ARTHROPLASTY;  Surgeon: Vickki Hearing, MD;  Location: [facility];  Service: Orthopedics;  Laterality: Left;   Social History   Tobacco Use   Smoking status: Former    Packs/day: 1.00    Years: 40.00    Additional pack years: 0.00    Total pack years: 40.00    Types: Cigarettes    Quit date: 03/20/1985    Years since quitting: 37.4   Smokeless tobacco: Never  Vaping Use   Vaping Use: Never used  Substance Use Topics   Alcohol use: No   Drug use: No   Family History  Problem Relation Age of Onset   High blood pressure Mother    Aneurysm Father    Diabetes Brother    Pancreatitis Brother    Hypertension Niece    Congestive Heart Failure Niece    Allergies  Allergen Reactions   Lovastatin Swelling    Patient states that her tongue swells and she gets a tingling feeling all over. Patient states that her tongue swells and she gets a tingling feeling all over.   Lisinopril Swelling    Tongue  swelling   Review of Systems  Psychiatric/Behavioral:  The patient has insomnia.   All other systems reviewed and are negative.    Objective:     BP (!) 102/58   Pulse 79   Ht 5' 2.5" (1.588 m)   Wt 136 lb (61.7 kg)   SpO2 92%   BMI 24.48 kg/m  BP Readings from Last 3 Encounters:  08/31/22 (!) 102/58  08/18/22 108/62  08/17/22 (!) 104/52   Physical Exam Vitals reviewed.  Constitutional:      General: She is not in acute distress.    Appearance: Normal appearance. She is not toxic-appearing.  HENT:     Head: Normocephalic and atraumatic.     Right Ear: External ear normal.     Left Ear: External ear normal.     Nose: Nose normal. No congestion or rhinorrhea.     Mouth/Throat:     Mouth: Mucous membranes are moist.     Pharynx: Oropharynx is clear. No oropharyngeal exudate  or posterior oropharyngeal erythema.  Eyes:     General: No scleral icterus.    Extraocular Movements: Extraocular movements intact.     Conjunctiva/sclera: Conjunctivae normal.     Pupils: Pupils are equal, round, and reactive to light.  Cardiovascular:     Rate and Rhythm: Normal rate and regular rhythm.     Pulses: Normal pulses.     Heart sounds: Normal heart sounds. No murmur heard.    No friction rub. No gallop.     Comments: CRT-D in left chest wall, incision has healed nicely Pulmonary:     Effort: Pulmonary effort is normal.     Breath sounds: Normal breath sounds. No wheezing, rhonchi or rales.  Abdominal:     General: Abdomen is flat. Bowel sounds are normal. There is no distension.     Palpations: Abdomen is soft.     Tenderness: There is no abdominal tenderness.  Musculoskeletal:        General: No swelling. Normal range of motion.     Cervical back: Normal range of motion.     Right lower leg: No edema.     Left lower leg: No edema.  Lymphadenopathy:     Cervical: No cervical adenopathy.  Skin:    General: Skin is warm and dry.     Capillary Refill: Capillary refill takes less than 2 seconds.     Coloration: Skin is not jaundiced.  Neurological:     General: No focal deficit present.     Mental Status: She is alert and oriented to person, place, and time.  Psychiatric:        Mood and Affect: Mood normal.        Behavior: Behavior normal.    Diabetic foot exam was performed.  Visual Findings: toenails removed from first digit of each foot Posterior tibialis and dorsalis pulse intact bilaterally.  Intact to touch and monofilament testing bilaterally.    Last CBC Lab Results  Component Value Date   WBC 6.7 07/05/2022   HGB 11.4 07/05/2022   HCT 35.6 07/05/2022   MCV 94 07/05/2022   MCH 30.0 07/05/2022   RDW 14.9 07/05/2022   PLT 385 07/05/2022   Last metabolic panel Lab Results  Component Value Date   GLUCOSE 74 08/17/2022   NA 135 08/17/2022   K  4.2 08/17/2022   CL 99 08/17/2022   CO2 23 08/17/2022   BUN 62 (H) 08/17/2022   CREATININE 1.60 (H) 08/17/2022   GFRNONAA  32 (L) 08/17/2022   CALCIUM 9.3 08/17/2022   PHOS 3.1 03/17/2022   PROT 6.8 08/17/2022   ALBUMIN 3.6 08/17/2022   LABGLOB 2.7 05/26/2022   AGRATIO 1.4 05/26/2022   BILITOT 0.5 08/17/2022   ALKPHOS 75 08/17/2022   AST 28 08/17/2022   ALT 19 08/17/2022   ANIONGAP 13 08/17/2022   Last lipids Lab Results  Component Value Date   CHOL 172 08/17/2022   HDL 82 08/17/2022   LDLCALC 81 08/17/2022   TRIG 45 08/17/2022   CHOLHDL 2.1 08/17/2022   Last hemoglobin A1c Lab Results  Component Value Date   HGBA1C 6.5 (H) 05/26/2022   Last thyroid functions Lab Results  Component Value Date   TSH 1.320 05/26/2022   Last vitamin D Lab Results  Component Value Date   VD25OH 60.7 05/26/2022   Last vitamin B12 and Folate Lab Results  Component Value Date   VITAMINB12 778 05/26/2022   FOLATE >20.0 05/26/2022     Assessment & Plan:   Problem List Items Addressed This Visit       Essential hypertension (Chronic)    Remains adequately controlled on current antihypertensive regimen.      Chronic systolic HF (heart failure) (HCC) (Chronic)    Euvolemic on exam today.  S/p CRT-D implantation on 5/8.  Last seen by cardiology for follow-up on 6/14.      OSA on CPAP (Chronic)    Reports today that she is not currently using CPAP.  She plans to further discuss this with pulmonology at follow-up in early August.      COPD GOLD 1     Remains asymptomatic.  Pulmonary exam unremarkable.  Followed by pulmonology (Dr. Craige Cotta).  Continues to use Trelegy.      Type 2 diabetes mellitus with diabetic chronic kidney disease (HCC)    Recent diagnosis.  Meeting criteria with A1c 6.5 in March.  She is currently prescribed Farxiga. -Urine microalbumin/creatinine ratio ordered today -Diabetic foot exam completed today -Recently evaluated at my eye doctor-[place]       Stage 3a chronic kidney disease (HCC)    Renal function stable on latest labs.      Chronic low back pain    She has recently established care with a new pain management provider.  Previously followed by Dr. Gerilyn Pilgrim.  Currently prescribed hydrocodone 5 mg 4 times daily and venlafaxine.  Pain is currently well-controlled.      Breast cancer screening by mammogram    Repeat screening mammogram ordered today      Return in about 3 months (around 12/01/2022).   [name], MD

## 2022-08-31 NOTE — Assessment & Plan Note (Signed)
Renal function stable on latest labs.

## 2022-08-31 NOTE — Assessment & Plan Note (Signed)
Reports today that she is not currently using CPAP.  She plans to further discuss this with pulmonology at follow-up in early August.

## 2022-09-02 LAB — MICROALBUMIN / CREATININE URINE RATIO
Creatinine, Urine: 33 mg/dL
Microalb/Creat Ratio: 12 mg/g creat (ref 0–29)
Microalbumin, Urine: 4 ug/mL

## 2022-09-04 DIAGNOSIS — I130 Hypertensive heart and chronic kidney disease with heart failure and stage 1 through stage 4 chronic kidney disease, or unspecified chronic kidney disease: Secondary | ICD-10-CM | POA: Diagnosis not present

## 2022-09-04 DIAGNOSIS — G4733 Obstructive sleep apnea (adult) (pediatric): Secondary | ICD-10-CM | POA: Diagnosis not present

## 2022-09-04 DIAGNOSIS — Z950 Presence of cardiac pacemaker: Secondary | ICD-10-CM | POA: Diagnosis not present

## 2022-09-04 DIAGNOSIS — M1030 Gout due to renal impairment, unspecified site: Secondary | ICD-10-CM | POA: Diagnosis not present

## 2022-09-04 DIAGNOSIS — N1832 Chronic kidney disease, stage 3b: Secondary | ICD-10-CM | POA: Diagnosis not present

## 2022-09-04 DIAGNOSIS — M1990 Unspecified osteoarthritis, unspecified site: Secondary | ICD-10-CM | POA: Diagnosis not present

## 2022-09-04 DIAGNOSIS — I428 Other cardiomyopathies: Secondary | ICD-10-CM | POA: Diagnosis not present

## 2022-09-04 DIAGNOSIS — I5023 Acute on chronic systolic (congestive) heart failure: Secondary | ICD-10-CM | POA: Diagnosis not present

## 2022-09-04 DIAGNOSIS — I5082 Biventricular heart failure: Secondary | ICD-10-CM | POA: Diagnosis not present

## 2022-09-04 DIAGNOSIS — I447 Left bundle-branch block, unspecified: Secondary | ICD-10-CM | POA: Diagnosis not present

## 2022-09-04 DIAGNOSIS — I051 Rheumatic mitral insufficiency: Secondary | ICD-10-CM | POA: Diagnosis not present

## 2022-09-04 DIAGNOSIS — I2510 Atherosclerotic heart disease of native coronary artery without angina pectoris: Secondary | ICD-10-CM | POA: Diagnosis not present

## 2022-09-04 DIAGNOSIS — J449 Chronic obstructive pulmonary disease, unspecified: Secondary | ICD-10-CM | POA: Diagnosis not present

## 2022-09-04 DIAGNOSIS — Z48812 Encounter for surgical aftercare following surgery on the circulatory system: Secondary | ICD-10-CM | POA: Diagnosis not present

## 2022-09-04 DIAGNOSIS — I272 Pulmonary hypertension, unspecified: Secondary | ICD-10-CM | POA: Diagnosis not present

## 2022-09-04 DIAGNOSIS — E785 Hyperlipidemia, unspecified: Secondary | ICD-10-CM | POA: Diagnosis not present

## 2022-09-08 ENCOUNTER — Other Ambulatory Visit: Payer: Self-pay | Admitting: Internal Medicine

## 2022-09-08 ENCOUNTER — Other Ambulatory Visit: Payer: Self-pay

## 2022-09-08 ENCOUNTER — Telehealth (HOSPITAL_COMMUNITY): Payer: Self-pay | Admitting: *Deleted

## 2022-09-08 DIAGNOSIS — M1A9XX0 Chronic gout, unspecified, without tophus (tophi): Secondary | ICD-10-CM

## 2022-09-08 MED ORDER — DAPAGLIFLOZIN PROPANEDIOL 10 MG PO TABS: 10.0000 mg | ORAL_TABLET | Freq: Every day | ORAL | 3 refills | Status: DC

## 2022-09-08 NOTE — Telephone Encounter (Signed)
Cardiac Rehab Medication Review by a Nurse   Does the patient  feel that his/her medications are working for him/her?  yes   Has the patient been experiencing any side effects to the medications prescribed?  yes   Does the patient measure his/her own blood pressure or blood glucose at home?  yes    Does the patient have any problems obtaining medications due to transportation or finances?   no   Understanding of regimen: good Understanding of indications: good Potential of compliance: good     Patient having headache and she has discussed with her PCP.  Verified medication via phone with patient.  Nurse comments:

## 2022-09-12 DIAGNOSIS — E785 Hyperlipidemia, unspecified: Secondary | ICD-10-CM | POA: Diagnosis not present

## 2022-09-12 DIAGNOSIS — I447 Left bundle-branch block, unspecified: Secondary | ICD-10-CM | POA: Diagnosis not present

## 2022-09-12 DIAGNOSIS — I5023 Acute on chronic systolic (congestive) heart failure: Secondary | ICD-10-CM | POA: Diagnosis not present

## 2022-09-12 DIAGNOSIS — G4733 Obstructive sleep apnea (adult) (pediatric): Secondary | ICD-10-CM | POA: Diagnosis not present

## 2022-09-12 DIAGNOSIS — M1030 Gout due to renal impairment, unspecified site: Secondary | ICD-10-CM | POA: Diagnosis not present

## 2022-09-12 DIAGNOSIS — J449 Chronic obstructive pulmonary disease, unspecified: Secondary | ICD-10-CM | POA: Diagnosis not present

## 2022-09-12 DIAGNOSIS — I5082 Biventricular heart failure: Secondary | ICD-10-CM | POA: Diagnosis not present

## 2022-09-12 DIAGNOSIS — N1832 Chronic kidney disease, stage 3b: Secondary | ICD-10-CM | POA: Diagnosis not present

## 2022-09-12 DIAGNOSIS — M1990 Unspecified osteoarthritis, unspecified site: Secondary | ICD-10-CM | POA: Diagnosis not present

## 2022-09-12 DIAGNOSIS — Z48812 Encounter for surgical aftercare following surgery on the circulatory system: Secondary | ICD-10-CM | POA: Diagnosis not present

## 2022-09-12 DIAGNOSIS — Z950 Presence of cardiac pacemaker: Secondary | ICD-10-CM | POA: Diagnosis not present

## 2022-09-12 DIAGNOSIS — I130 Hypertensive heart and chronic kidney disease with heart failure and stage 1 through stage 4 chronic kidney disease, or unspecified chronic kidney disease: Secondary | ICD-10-CM | POA: Diagnosis not present

## 2022-09-12 DIAGNOSIS — I428 Other cardiomyopathies: Secondary | ICD-10-CM | POA: Diagnosis not present

## 2022-09-12 DIAGNOSIS — I272 Pulmonary hypertension, unspecified: Secondary | ICD-10-CM | POA: Diagnosis not present

## 2022-09-12 DIAGNOSIS — I051 Rheumatic mitral insufficiency: Secondary | ICD-10-CM | POA: Diagnosis not present

## 2022-09-12 DIAGNOSIS — I2510 Atherosclerotic heart disease of native coronary artery without angina pectoris: Secondary | ICD-10-CM | POA: Diagnosis not present

## 2022-09-13 ENCOUNTER — Telehealth: Payer: Self-pay | Admitting: Internal Medicine

## 2022-09-13 ENCOUNTER — Other Ambulatory Visit: Payer: Self-pay

## 2022-09-13 ENCOUNTER — Ambulatory Visit (HOSPITAL_COMMUNITY)
Admission: RE | Admit: 2022-09-13 | Discharge: 2022-09-13 | Disposition: A | Discharge status: Home / Self Care | Payer: Medicare Other | Source: Ambulatory Visit | Attending: Internal Medicine | Admitting: Internal Medicine

## 2022-09-13 DIAGNOSIS — Z1231 Encounter for screening mammogram for malignant neoplasm of breast: Secondary | ICD-10-CM | POA: Insufficient documentation

## 2022-09-13 MED ORDER — SPIRONOLACTONE 25 MG PO TABS: 25.0000 mg | ORAL_TABLET | Freq: Every day | ORAL | 5 refills | Status: DC

## 2022-09-13 NOTE — Telephone Encounter (Signed)
Prescription Request  09/13/2022  LOV: 08/31/2022  What is the name of the medication or equipment? spironolactone (ALDACTONE) 25 MG table   Have you contacted your pharmacy to request a refill? Yes   Which pharmacy would you like this sent to?  [facility] - [place], [place] - [address] [place] [address] Phone: [phone] Fax: [phone]  Patient notified that their request is being sent to the clinical staff for review and that they should receive a response within 2 business days.   Please advise at [facility] [phone]

## 2022-09-13 NOTE — Telephone Encounter (Signed)
Refill sent.

## 2022-09-14 ENCOUNTER — Encounter (HOSPITAL_COMMUNITY)
Admission: RE | Admit: 2022-09-14 | Discharge: 2022-09-14 | Disposition: A | Discharge status: Home / Self Care | Payer: Medicare Other | Source: Ambulatory Visit | Attending: Internal Medicine | Admitting: Internal Medicine

## 2022-09-14 VITALS — Ht 62.5 in | Wt 135.9 lb

## 2022-09-14 DIAGNOSIS — I5022 Chronic systolic (congestive) heart failure: Secondary | ICD-10-CM | POA: Insufficient documentation

## 2022-09-14 NOTE — Patient Instructions (Signed)
Patient Instructions  Patient Details  Name: Heather [name] MRN: [id number] Date of Birth: 04-12-1940 Referring Provider:  Marjo Bicker, MD  Below are your personal goals for exercise, nutrition, and risk factors. Our goal is to help you stay on track towards obtaining and maintaining these goals. We will be discussing your progress on these goals with you throughout the program.  Initial Exercise Prescription:  Initial Exercise Prescription - 09/14/22 1400       Date of Initial Exercise RX and Referring Provider   Date 09/14/22    Referring Provider Luane School MD      Oxygen   Maintain Oxygen Saturation 88% or higher      Treadmill   MPH 1.3    Grade 0.5    Minutes 15    METs 2.08      NuStep   Level 1    SPM 80    Minutes 15    METs 2      Prescription Details   Frequency (times per week) 3    Duration Progress to 30 minutes of continuous aerobic without signs/symptoms of physical distress      Intensity   THRR 40-80% of Max Heartrate 94-124    Ratings of Perceived Exertion 11-13    Perceived Dyspnea 0-4      Progression   Progression Continue to progress workloads to maintain intensity without signs/symptoms of physical distress.      Resistance Training   Training Prescription Yes    Weight 2 lb    Reps 10-15             Exercise Goals: Frequency: Be able to perform aerobic exercise two to three times per week in program working toward 2-5 days per week of home exercise.  Intensity: Work with a perceived exertion of 11 (fairly light) - 15 (hard) while following your exercise prescription.  We will make changes to your prescription with you as you progress through the program.   Duration: Be able to do 30 to 45 minutes of continuous aerobic exercise in addition to a 5 minute warm-up and a 5 minute cool-down routine.   Nutrition Goals: Your personal nutrition goals will be established when you do your nutrition analysis with the  dietician.  The following are general nutrition guidelines to follow: Cholesterol < 200mg /day Sodium < 1500mg /day Fiber: Women over 50 yrs - 21 grams per day  Personal Goals:  Personal Goals and Risk Factors at Admission - 09/14/22 1412       Core Components/Risk Factors/Patient Goals on Admission    Weight Management Yes;Weight Maintenance    Intervention Weight Management: Develop a combined nutrition and exercise program designed to reach desired caloric intake, while maintaining appropriate intake of nutrient and fiber, sodium and fats, and appropriate energy expenditure required for the weight goal.;Weight Management: Provide education and appropriate resources to help participant work on and attain dietary goals.    Admit Weight 135 lb 14.4 oz (61.6 kg)    Goal Weight: Short Term 135 lb (61.2 kg)    Goal Weight: Long Term 135 lb (61.2 kg)    Expected Outcomes Short Term: Continue to assess and modify interventions until short term weight is achieved;Long Term: Adherence to nutrition and physical activity/exercise program aimed toward attainment of established weight goal;Weight Maintenance: Understanding of the daily nutrition guidelines, which includes 25-35% calories from fat, 7% or less cal from saturated fats, less than 200mg  cholesterol, less than 1.5gm of sodium, & 5 or  more servings of fruits and vegetables daily    Diabetes Yes    Intervention Provide education about signs/symptoms and action to take for hypo/hyperglycemia.;Provide education about proper nutrition, including hydration, and aerobic/resistive exercise prescription along with prescribed medications to achieve blood glucose in normal ranges: Fasting glucose 65-99 mg/dL    Expected Outcomes Short Term: Participant verbalizes understanding of the signs/symptoms and immediate care of hyper/hypoglycemia, proper foot care and importance of medication, aerobic/resistive exercise and nutrition plan for blood glucose  control.;Long Term: Attainment of HbA1C < 7%.    Heart Failure Yes    Intervention Provide a combined exercise and nutrition program that is supplemented with education, support and counseling about heart failure. Directed toward relieving symptoms such as shortness of breath, decreased exercise tolerance, and extremity edema.    Expected Outcomes Improve functional capacity of life;Short term: Attendance in program 2-3 days a week with increased exercise capacity. Reported lower sodium intake. Reported increased fruit and vegetable intake. Reports medication compliance.;Short term: Daily weights obtained and reported for increase. Utilizing diuretic protocols set by physician.;Long term: Adoption of self-care skills and reduction of barriers for early signs and symptoms recognition and intervention leading to self-care maintenance.    Hypertension Yes    Intervention Provide education on lifestyle modifcations including regular physical activity/exercise, weight management, moderate sodium restriction and increased consumption of fresh fruit, vegetables, and low fat dairy, alcohol moderation, and smoking cessation.;Monitor prescription use compliance.    Expected Outcomes Short Term: Continued assessment and intervention until BP is < 140/48mm HG in hypertensive participants. < 130/42mm HG in hypertensive participants with diabetes, heart failure or chronic kidney disease.;Long Term: Maintenance of blood pressure at goal levels.    Lipids Yes    Intervention Provide education and support for participant on nutrition & aerobic/resistive exercise along with prescribed medications to achieve LDL 70mg , HDL >40mg .    Expected Outcomes Short Term: Participant states understanding of desired cholesterol values and is compliant with medications prescribed. Participant is following exercise prescription and nutrition guidelines.;Long Term: Cholesterol controlled with medications as prescribed, with individualized  exercise RX and with personalized nutrition plan. Value goals: LDL < 70mg , HDL > 40 mg.             Tobacco Use Initial Evaluation: Social History   Tobacco Use  Smoking Status Former   Current packs/day: 0.00   Average packs/day: 1 pack/day for 40.0 years (40.0 ttl pk-yrs)   Types: Cigarettes   Start date: 03/20/1945   Quit date: 03/20/1985   Years since quitting: 37.5  Smokeless Tobacco Never    Exercise Goals and Review:  Exercise Goals     Row Name 09/14/22 1405             Exercise Goals   Increase Physical Activity Yes       Intervention Provide advice, education, support and counseling about physical activity/exercise needs.;Develop an individualized exercise prescription for aerobic and resistive training based on initial evaluation findings, risk stratification, comorbidities and participant's personal goals.       Expected Outcomes Short Term: Attend rehab on a regular basis to increase amount of physical activity.;Long Term: Exercising regularly at least 3-5 days a week.;Long Term: Add in home exercise to make exercise part of routine and to increase amount of physical activity.       Increase Strength and Stamina Yes       Intervention Provide advice, education, support and counseling about physical activity/exercise needs.;Develop an individualized exercise prescription for aerobic and  resistive training based on initial evaluation findings, risk stratification, comorbidities and participant's personal goals.       Expected Outcomes Short Term: Increase workloads from initial exercise prescription for resistance, speed, and METs.;Short Term: Perform resistance training exercises routinely during rehab and add in resistance training at home;Long Term: Improve cardiorespiratory fitness, muscular endurance and strength as measured by increased METs and functional capacity ( )       Able to understand and use rate of perceived exertion (RPE) scale Yes        Intervention Provide education and explanation on how to use RPE scale       Expected Outcomes Short Term: Able to use RPE daily in rehab to express subjective intensity level;Long Term:  Able to use RPE to guide intensity level when exercising independently       Able to understand and use Dyspnea scale Yes       Intervention Provide education and explanation on how to use Dyspnea scale       Expected Outcomes Short Term: Able to use Dyspnea scale daily in rehab to express subjective sense of shortness of breath during exertion;Long Term: Able to use Dyspnea scale to guide intensity level when exercising independently       Knowledge and understanding of Target Heart Rate Range (THRR) Yes       Intervention Provide education and explanation of THRR including how the numbers were predicted and where they are located for reference       Expected Outcomes Short Term: Able to state/look up THRR;Short Term: Able to use daily as guideline for intensity in rehab;Long Term: Able to use THRR to govern intensity when exercising independently       Able to check pulse independently Yes       Intervention Provide education and demonstration on how to check pulse in carotid and radial arteries.;Review the importance of being able to check your own pulse for safety during independent exercise       Expected Outcomes Long Term: Able to check pulse independently and accurately;Short Term: Able to explain why pulse checking is important during independent exercise       Understanding of Exercise Prescription Yes       Intervention Provide education, explanation, and written materials on patient's individual exercise prescription       Expected Outcomes Short Term: Able to explain program exercise prescription;Long Term: Able to explain home exercise prescription to exercise independently              Copy of goals given to participant.

## 2022-09-14 NOTE — Progress Notes (Signed)
Cardiac Individual Treatment Plan  Patient Details  Name: Heather [name] MRN: [id number] Date of Birth: 23-Dec-1940 Referring Provider:   Flowsheet Row CARDIAC REHAB PHASE II ORIENTATION from 09/14/2022 in [facility] CARDIAC REHABILITATION  Referring Provider Luane School MD       Initial Encounter Date:  Flowsheet Row CARDIAC REHAB PHASE II ORIENTATION from 09/14/2022 in [place] CARDIAC REHABILITATION  Date 09/14/22       Visit Diagnosis: Heart failure, chronic systolic (HCC)  Patient's Home Medications on Admission:  Current Outpatient Medications:    acetaminophen (TYLENOL) 500 MG tablet, Take 500 mg by mouth every 6 (six) hours as needed for mild pain or moderate pain., Disp: , Rfl:    allopurinol (ZYLOPRIM) 300 MG tablet, TAKE 1/2 TABLET BY MOUTH ONCE DAILY, Disp: 45 tablet, Rfl: 0   aspirin (ASPIRIN CHILDRENS) 81 MG chewable tablet, Chew 1 tablet (81 mg total) by mouth daily., Disp: 90 tablet, Rfl: 3   atorvastatin (LIPITOR) 20 MG tablet, Take 1 tablet (20 mg total) by mouth daily., Disp: 90 tablet, Rfl: 3   bisoprolol (ZEBETA) 5 MG tablet, Take 2.5 mg by mouth daily., Disp: , Rfl:    chlorpheniramine (CHLOR-TRIMETON) 4 MG tablet, Take 4 mg by mouth daily as needed for allergies., Disp: , Rfl:    Cholecalciferol (DIALYVITE VITAMIN D 5000 PO), Take 5,000 Units by mouth daily., Disp: , Rfl:    dapagliflozin propanediol (FARXIGA) 10 MG TABS tablet, Take 1 tablet (10 mg total) by mouth daily., Disp: 90 tablet, Rfl: 3   digoxin (LANOXIN) 0.125 MG tablet, Take 0.5 tablets (0.0625 mg total) by mouth daily., Disp: 90 tablet, Rfl: 3   famotidine (PEPCID) 20 MG tablet, Take 1 tablet (20 mg total) by mouth at bedtime. One after supper (Patient taking differently: Take 20 mg by mouth 2 (two) times daily.), Disp: 30 tablet, Rfl: 3   Fluticasone-Umeclidin-Vilant (TRELEGY ELLIPTA) 200-62.5-25 MCG/ACT AEPB, Inhale 1 puff into the lungs daily., Disp: 1 each, Rfl: 0   furosemide (LASIX)  40 MG tablet, Take 60 mg by mouth daily., Disp: , Rfl:    hydrALAZINE (APRESOLINE) 25 MG tablet, Take 1 tablet (25 mg total) by mouth 3 (three) times daily., Disp: 180 tablet, Rfl: 3   HYDROcodone-acetaminophen (NORCO/VICODIN) 5-325 MG tablet, Take 1 tablet by mouth every 6 (six) hours as needed for moderate pain., Disp: 120 tablet, Rfl: 0   isosorbide mononitrate (IMDUR) 30 MG 24 hr tablet, Take 1 tablet (30 mg total) by mouth daily., Disp: 90 tablet, Rfl: 3   ketoconazole (NIZORAL) 2 % cream, Apply 1 Application topically as needed for irritation., Disp: , Rfl:    losartan (COZAAR) 25 MG tablet, Take 1 tablet (25 mg total) by mouth daily., Disp: 90 tablet, Rfl: 3   Multiple Vitamins-Minerals (MULTIVITAMIN WITH MINERALS) tablet, Take 1 tablet by mouth daily., Disp: , Rfl:    PROAIR HFA 108 (90 Base) MCG/ACT inhaler, Inhale 2 puffs into the lungs every 6 (six) hours as needed for wheezing or shortness of breath., Disp: 18 g, Rfl: 2   Propylene Glycol (SYSTANE COMPLETE) 0.6 % SOLN, Place 1 drop into both eyes 2 (two) times daily., Disp: , Rfl:    spironolactone (ALDACTONE) 25 MG tablet, Take 1 tablet (25 mg total) by mouth daily., Disp: 30 tablet, Rfl: 5   venlafaxine XR (EFFEXOR-XR) 75 MG 24 hr capsule, Take 1 capsule (75 mg total) by mouth See admin instructions., Disp: 30 capsule, Rfl: 5  Past Medical History: Past Medical History:  Diagnosis Date   Anemia    Anxiety    Arthritis    COPD (chronic obstructive pulmonary disease) (HCC)    CVA (cerebral vascular accident) (HCC)    Depression    Dry eyes 04/14/2014   GERD (gastroesophageal reflux disease)    Glaucoma    Gout    HTN (hypertension)    OSA (obstructive sleep apnea)     Tobacco Use: Social History   Tobacco Use  Smoking Status Former   Current packs/day: 0.00   Average packs/day: 1 pack/day for 40.0 years (40.0 ttl pk-yrs)   Types: Cigarettes   Start date: 03/20/1945   Quit date: 03/20/1985   Years since quitting: 37.5   Smokeless Tobacco Never    Labs: Review Flowsheet  More data exists      Latest Ref Rng & Units 03/18/2022 03/19/2022 03/20/2022 05/26/2022 08/17/2022  Labs for ITP Cardiac and Pulmonary Rehab  Cholestrol 0 - 200 mg/dL - - - 098  119   LDL (calc) 0 - 99 mg/dL - - - 82  81   HDL-C >14 mg/dL - - - 65  82   Trlycerides <150 mg/dL - - - 58  45   Hemoglobin A1c 4.8 - 5.6 % - - - 6.5  -  O2 Saturation % 78.3  72.2  59.1  - -    Details            Capillary Blood Glucose: No results found for: "GLUCAP"   Exercise Target Goals: Exercise Program Goal: Individual exercise prescription set using results from initial 6 min walk test and THRR while considering  patient's activity barriers and safety.   Exercise Prescription Goal: Starting with aerobic activity 30 plus minutes a day, 3 days per week for initial exercise prescription. Provide home exercise prescription and guidelines that participant acknowledges understanding prior to discharge.  Activity Barriers & Risk Stratification:  Activity Barriers & Cardiac Risk Stratification - 09/14/22 1236       Activity Barriers & Cardiac Risk Stratification   Activity Barriers Left Hip Replacement;Right Hip Replacement;Right Knee Replacement;Joint Problems;Assistive Device;History of Falls;Balance Concerns   L shoulder replacement   Cardiac Risk Stratification High             6 Minute Walk:  6 Minute Walk     Row Name 09/14/22 1402         6 Minute Walk   Phase Initial     Distance 828 feet     Walk Time 6 minutes     # of Rest Breaks 0     MPH 1.57     METS 1.54     RPE 11     VO2 Peak 5.3     Symptoms Yes (comment)     Comments R hip pain 5/10     Resting HR 63 bpm     Resting BP 122/62     Resting Oxygen Saturation  99 %     Exercise Oxygen Saturation  during 6 min walk 96 %     Max Ex. HR 89 bpm     Max Ex. BP 134/72     2 Minute Post BP 124/70              Oxygen Initial Assessment:   Oxygen  Re-Evaluation:   Oxygen Discharge (Final Oxygen Re-Evaluation):   Initial Exercise Prescription:  Initial Exercise Prescription - 09/14/22 1400       Date of Initial Exercise RX and Referring Provider  Date 09/14/22    Referring Provider Luane School MD      Oxygen   Maintain Oxygen Saturation 88% or higher      Treadmill   MPH 1.3    Grade 0.5    Minutes 15    METs 2.08      NuStep   Level 1    SPM 80    Minutes 15    METs 2      Prescription Details   Frequency (times per week) 3    Duration Progress to 30 minutes of continuous aerobic without signs/symptoms of physical distress      Intensity   THRR 40-80% of Max Heartrate 94-124    Ratings of Perceived Exertion 11-13    Perceived Dyspnea 0-4      Progression   Progression Continue to progress workloads to maintain intensity without signs/symptoms of physical distress.      Resistance Training   Training Prescription Yes    Weight 2 lb    Reps 10-15             Perform Capillary Blood Glucose checks as needed.  Exercise Prescription Changes:   Exercise Prescription Changes     Row Name 09/14/22 1400             Response to Exercise   Blood Pressure (Admit) 122/62       Blood Pressure (Exercise) 134/72       Blood Pressure (Exit) 124/70       Heart Rate (Admit) 653 bpm       Heart Rate (Exercise) 89 bpm       Heart Rate (Exit) 69 bpm       Oxygen Saturation (Admit) 99 %       Oxygen Saturation (Exercise) 96 %       Rating of Perceived Exertion (Exercise) 11       Symptoms R hip pain 5/10       Comments walk test results                Exercise Comments:   Exercise Goals and Review:   Exercise Goals     Row Name 09/14/22 1405             Exercise Goals   Increase Physical Activity Yes       Intervention Provide advice, education, support and counseling about physical activity/exercise needs.;Develop an individualized exercise prescription for aerobic and  resistive training based on initial evaluation findings, risk stratification, comorbidities and participant's personal goals.       Expected Outcomes Short Term: Attend rehab on a regular basis to increase amount of physical activity.;Long Term: Exercising regularly at least 3-5 days a week.;Long Term: Add in home exercise to make exercise part of routine and to increase amount of physical activity.       Increase Strength and Stamina Yes       Intervention Provide advice, education, support and counseling about physical activity/exercise needs.;Develop an individualized exercise prescription for aerobic and resistive training based on initial evaluation findings, risk stratification, comorbidities and participant's personal goals.       Expected Outcomes Short Term: Increase workloads from initial exercise prescription for resistance, speed, and METs.;Short Term: Perform resistance training exercises routinely during rehab and add in resistance training at home;Long Term: Improve cardiorespiratory fitness, muscular endurance and strength as measured by increased METs and functional capacity ( )       Able to understand and use rate of perceived  exertion (RPE) scale Yes       Intervention Provide education and explanation on how to use RPE scale       Expected Outcomes Short Term: Able to use RPE daily in rehab to express subjective intensity level;Long Term:  Able to use RPE to guide intensity level when exercising independently       Able to understand and use Dyspnea scale Yes       Intervention Provide education and explanation on how to use Dyspnea scale       Expected Outcomes Short Term: Able to use Dyspnea scale daily in rehab to express subjective sense of shortness of breath during exertion;Long Term: Able to use Dyspnea scale to guide intensity level when exercising independently       Knowledge and understanding of Target Heart Rate Range (THRR) Yes       Intervention Provide education and  explanation of THRR including how the numbers were predicted and where they are located for reference       Expected Outcomes Short Term: Able to state/look up THRR;Short Term: Able to use daily as guideline for intensity in rehab;Long Term: Able to use THRR to govern intensity when exercising independently       Able to check pulse independently Yes       Intervention Provide education and demonstration on how to check pulse in carotid and radial arteries.;Review the importance of being able to check your own pulse for safety during independent exercise       Expected Outcomes Long Term: Able to check pulse independently and accurately;Short Term: Able to explain why pulse checking is important during independent exercise       Understanding of Exercise Prescription Yes       Intervention Provide education, explanation, and written materials on patient's individual exercise prescription       Expected Outcomes Short Term: Able to explain program exercise prescription;Long Term: Able to explain home exercise prescription to exercise independently                Exercise Goals Re-Evaluation :  Exercise Goals Re-Evaluation     Row Name 09/14/22 1405             Exercise Goal Re-Evaluation   Exercise Goals Review Understanding of Exercise Prescription;Able to understand and use Dyspnea scale;Able to understand and use rate of perceived exertion (RPE) scale       Comments Reviewed RPE  and dyspnea scale and program prescription with pt today.  Pt voiced understanding and was given a copy of goals to take home.       Expected Outcomes Short: Use RPE daily to regulate intensity.  Long: Follow program prescription                 Discharge Exercise Prescription (Final Exercise Prescription Changes):  Exercise Prescription Changes - 09/14/22 1400       Response to Exercise   Blood Pressure (Admit) 122/62    Blood Pressure (Exercise) 134/72    Blood Pressure (Exit) 124/70    Heart  Rate (Admit) 653 bpm    Heart Rate (Exercise) 89 bpm    Heart Rate (Exit) 69 bpm    Oxygen Saturation (Admit) 99 %    Oxygen Saturation (Exercise) 96 %    Rating of Perceived Exertion (Exercise) 11    Symptoms R hip pain 5/10    Comments walk test results  Nutrition:  Target Goals: Understanding of nutrition guidelines, daily intake of sodium 1500mg , cholesterol 200mg , calories 30% from fat and 7% or less from saturated fats, daily to have 5 or more servings of fruits and vegetables.  Biometrics:  Pre Biometrics - 09/14/22 1406       Pre Biometrics   Height 5' 2.5" (1.588 m)    Weight 61.6 kg    Waist Circumference 28 inches    Hip Circumference 34 inches    Waist to Hip Ratio 0.82 %    BMI (Calculated) 24.44    Triceps Skinfold 14 mm    % Body Fat 32.2 %    Grip Strength 17.1 kg    Single Leg Stand 5 seconds              Nutrition Therapy Plan and Nutrition Goals:  Nutrition Therapy & Goals - 09/14/22 1412       Intervention Plan   Intervention Prescribe, educate and counsel regarding individualized specific dietary modifications aiming towards targeted core components such as weight, hypertension, lipid management, diabetes, heart failure and other comorbidities.;Nutrition handout(s) given to patient.    Expected Outcomes Short Term Goal: Understand basic principles of dietary content, such as calories, fat, sodium, cholesterol and nutrients.             Nutrition Assessments:  Nutrition Assessments - 09/14/22 1412       MEDFICTS Scores   Pre Score 49            MEDIFICTS Score Key: ?70 Need to make dietary changes  40-70 Heart Healthy Diet ? 40 Therapeutic Level Cholesterol Diet   Picture Your Plate Scores: <16 Unhealthy dietary pattern with much room for improvement. 41-50 Dietary pattern unlikely to meet recommendations for good health and room for improvement. 51-60 More healthful dietary pattern, with some room for  improvement.  >60 Healthy dietary pattern, although there may be some specific behaviors that could be improved.    Nutrition Goals Re-Evaluation:   Nutrition Goals Discharge (Final Nutrition Goals Re-Evaluation):   Psychosocial: Target Goals: Acknowledge presence or absence of significant depression and/or stress, maximize coping skills, provide positive support system. Participant is able to verbalize types and ability to use techniques and skills needed for reducing stress and depression.  Initial Review & Psychosocial Screening:  Initial Psych Review & Screening - 09/14/22 1239       Initial Review   Current issues with Current Psychotropic Meds;History of Depression;Current Stress Concerns    Source of Stress Concerns Financial;Unable to participate in former interests or hobbies;Unable to perform yard/household activities    Comments letter from IRS about 2023 tax return not being filed, she is taking effexor for depression and feeling well managed currently, sleeps well uses Sleepy Time Tea, heart failure has slowed her down wants to get back to her normal      Family Dynamics   Good Support System? Yes   has a friend she relies on, 3 children (2 sons in [place], daughter in [place]) they call often and sons come to check in     Barriers   Psychosocial barriers to participate in program Psychosocial barriers identified (see note);The patient should benefit from training in stress management and relaxation.      Screening Interventions   Interventions Encouraged to exercise;To provide support and resources with identified psychosocial needs;Provide feedback about the scores to participant    Expected Outcomes Short Term goal: Utilizing psychosocial counselor, staff and physician to assist with identification  of specific Stressors or current issues interfering with healing process. Setting desired goal for each stressor or current issue identified.;Long Term Goal: Stressors or current  issues are controlled or eliminated.;Short Term goal: Identification and review with participant of any Quality of Life or Depression concerns found by scoring the questionnaire.;Long Term goal: The participant improves quality of Life and PHQ9 Scores as seen by post scores and/or verbalization of changes             Quality of Life Scores:  Quality of Life - 09/14/22 1411       Quality of Life   Select Quality of Life      Quality of Life Scores   Health/Function Pre 25 %    Socioeconomic Pre 24.08 %    Psych/Spiritual Pre 28 %    Family Pre 20.75 %    GLOBAL Pre 24.88 %            Scores of 19 and below usually indicate a poorer quality of life in these areas.  A difference of  2-3 points is a clinically meaningful difference.  A difference of 2-3 points in the total score of the Quality of Life Index has been associated with significant improvement in overall quality of life, self-image, physical symptoms, and general health in studies assessing change in quality of life.  PHQ-9: Review Flowsheet  More data exists      09/14/2022 08/31/2022 05/26/2022 04/03/2022 02/02/2022  Depression screen PHQ 2/9  Decreased Interest 0 0 0 0 1 0  Down, Depressed, Hopeless 0 0 0 0 0 0  PHQ - 2 Score 0 0 0 0 1 0  Altered sleeping 0 - 2 2 0  Tired, decreased energy 1 - 0 1 2  Change in appetite 1 - 2 1 1   Feeling bad or failure about yourself  0 - 0 1 0  Trouble concentrating 0 - 0 0 0  Moving slowly or fidgety/restless 0 - 1 1 0  Suicidal thoughts 0 - 0 0 0  PHQ-9 Score 2 - 5 7 3   Difficult doing work/chores Not difficult at all - - Not difficult at all -    Details       Multiple values from one day are sorted in reverse-chronological order        Interpretation of Total Score  Total Score Depression Severity:  1-4 = Minimal depression, 5-9 = Mild depression, 10-14 = Moderate depression, 15-19 = Moderately severe depression, 20-27 = Severe depression   Psychosocial  Evaluation and Intervention:  Psychosocial Evaluation - 09/14/22 1407       Psychosocial Evaluation & Interventions   Interventions Encouraged to exercise with the program and follow exercise prescription    Comments Dicy is coming into cardiac rehab for heart failure.  She has had two hospitalizations from heart failure over the past year.  She has not been able to go and do and exercise like she used to and would like to rebuild her strength and stamina.  She wants to get back to her normal again.  She lives alone, but has a "friend" and they check on each other frequently.  She has two sons in [place] that call regularly and come by to check in from time to time.  She also has a daughter in [place] that calls at least weekly.  She usually sleeps well using her Sleepy time tea at night.  Her biggest stressor other than her health is trying to figure her  finances as she did not file taxes for 2023 thinking she didn't need to and got a letter telling her otherwise.  Overall she is a positive and happy person.  She does have a history of depression but PHQ showed no current issues. She take effexor for her depression and is feeling well managed at this time.  She is excited to start program and does not preceive any  barriers to attending rehab.    Expected Outcomes Short: Attend rehab to build stamina Long: Be able to go and do as she wants to do again    Continue Psychosocial Services  Follow up required by staff             Psychosocial Re-Evaluation:   Psychosocial Discharge (Final Psychosocial Re-Evaluation):   Vocational Rehabilitation: Provide vocational rehab assistance to qualifying candidates.   Vocational Rehab Evaluation & Intervention:  Vocational Rehab - 09/14/22 1235       Initial Vocational Rehab Evaluation & Intervention   Assessment shows need for Vocational Rehabilitation No   retired            Education: Education Goals: Education classes will be  provided on a weekly basis, covering required topics. Participant will state understanding/return demonstration of topics presented.  Learning Barriers/Preferences:  Learning Barriers/Preferences - 09/14/22 1234       Learning Barriers/Preferences   Learning Barriers Sight   glasses for reading   Learning Preferences Skilled Demonstration;Written Material             Education Topics: Hypertension, Hypertension Reduction -Define heart disease and high blood pressure. Discus how high blood pressure affects the body and ways to reduce high blood pressure.   Exercise and Your Heart -Discuss why it is important to exercise, the FITT principles of exercise, normal and abnormal responses to exercise, and how to exercise safely.   Angina -Discuss definition of angina, causes of angina, treatment of angina, and how to decrease risk of having angina.   Cardiac Medications -Review what the following cardiac medications are used for, how they affect the body, and side effects that may occur when taking the medications.  Medications include Aspirin, Beta blockers, calcium channel blockers, ACE Inhibitors, angiotensin receptor blockers, diuretics, digoxin, and antihyperlipidemics.   Congestive Heart Failure -Discuss the definition of CHF, how to live with CHF, the signs and symptoms of CHF, and how keep track of weight and sodium intake.   Heart Disease and Intimacy -Discus the effect sexual activity has on the heart, how changes occur during intimacy as we age, and safety during sexual activity.   Smoking Cessation / COPD -Discuss different methods to quit smoking, the health benefits of quitting smoking, and the definition of COPD.   Nutrition I: Fats -Discuss the types of cholesterol, what cholesterol does to the heart, and how cholesterol levels can be controlled.   Nutrition II: Labels -Discuss the different components of food labels and how to read food label   Heart  Parts/Heart Disease and PAD -Discuss the anatomy of the heart, the pathway of blood circulation through the heart, and these are affected by heart disease.   Stress I: Signs and Symptoms -Discuss the causes of stress, how stress may lead to anxiety and depression, and ways to limit stress.   Stress II: Relaxation -Discuss different types of relaxation techniques to limit stress.   Warning Signs of Stroke / TIA -Discuss definition of a stroke, what the signs and symptoms are of a stroke, and how to identify  when someone is having stroke.   Knowledge Questionnaire Score:  Knowledge Questionnaire Score - 09/14/22 1412       Knowledge Questionnaire Score   Pre Score 19/24             Core Components/Risk Factors/Patient Goals at Admission:  Personal Goals and Risk Factors at Admission - 09/14/22 1412       Core Components/Risk Factors/Patient Goals on Admission    Weight Management Yes;Weight Maintenance    Intervention Weight Management: Develop a combined nutrition and exercise program designed to reach desired caloric intake, while maintaining appropriate intake of nutrient and fiber, sodium and fats, and appropriate energy expenditure required for the weight goal.;Weight Management: Provide education and appropriate resources to help participant work on and attain dietary goals.    Admit Weight 135 lb 14.4 oz (61.6 kg)    Goal Weight: Short Term 135 lb (61.2 kg)    Goal Weight: Long Term 135 lb (61.2 kg)    Expected Outcomes Short Term: Continue to assess and modify interventions until short term weight is achieved;Long Term: Adherence to nutrition and physical activity/exercise program aimed toward attainment of established weight goal;Weight Maintenance: Understanding of the daily nutrition guidelines, which includes 25-35% calories from fat, 7% or less cal from saturated fats, less than 200mg  cholesterol, less than 1.5gm of sodium, & 5 or more servings of fruits and  vegetables daily    Diabetes Yes    Intervention Provide education about signs/symptoms and action to take for hypo/hyperglycemia.;Provide education about proper nutrition, including hydration, and aerobic/resistive exercise prescription along with prescribed medications to achieve blood glucose in normal ranges: Fasting glucose 65-99 mg/dL    Expected Outcomes Short Term: Participant verbalizes understanding of the signs/symptoms and immediate care of hyper/hypoglycemia, proper foot care and importance of medication, aerobic/resistive exercise and nutrition plan for blood glucose control.;Long Term: Attainment of HbA1C < 7%.    Heart Failure Yes    Intervention Provide a combined exercise and nutrition program that is supplemented with education, support and counseling about heart failure. Directed toward relieving symptoms such as shortness of breath, decreased exercise tolerance, and extremity edema.    Expected Outcomes Improve functional capacity of life;Short term: Attendance in program 2-3 days a week with increased exercise capacity. Reported lower sodium intake. Reported increased fruit and vegetable intake. Reports medication compliance.;Short term: Daily weights obtained and reported for increase. Utilizing diuretic protocols set by physician.;Long term: Adoption of self-care skills and reduction of barriers for early signs and symptoms recognition and intervention leading to self-care maintenance.    Hypertension Yes    Intervention Provide education on lifestyle modifcations including regular physical activity/exercise, weight management, moderate sodium restriction and increased consumption of fresh fruit, vegetables, and low fat dairy, alcohol moderation, and smoking cessation.;Monitor prescription use compliance.    Expected Outcomes Short Term: Continued assessment and intervention until BP is < 140/55mm HG in hypertensive participants. < 130/34mm HG in hypertensive participants with  diabetes, heart failure or chronic kidney disease.;Long Term: Maintenance of blood pressure at goal levels.    Lipids Yes    Intervention Provide education and support for participant on nutrition & aerobic/resistive exercise along with prescribed medications to achieve LDL 70mg , HDL >40mg .    Expected Outcomes Short Term: Participant states understanding of desired cholesterol values and is compliant with medications prescribed. Participant is following exercise prescription and nutrition guidelines.;Long Term: Cholesterol controlled with medications as prescribed, with individualized exercise RX and with personalized nutrition plan. Value goals: LDL <  70mg , HDL > 40 mg.             Core Components/Risk Factors/Patient Goals Review:    Core Components/Risk Factors/Patient Goals at Discharge (Final Review):    ITP Comments:  ITP Comments     Row Name 09/14/22 1401           ITP Comments Patient attend orientation today.  Patient is attending[facility]iac [facility] Program.  Documentation for diagnosis can be found in [facility] encounter 08/18/22.  Reviewed medical chart, RPE, gym safety, and program guidelines.  Patient was fitted to equipment they will be using during rehab.  Patient is scheduled to start exercise on 09/18/22.                Comments: Initial ITP

## 2022-09-18 ENCOUNTER — Encounter (HOSPITAL_COMMUNITY)
Admission: RE | Admit: 2022-09-18 | Discharge: 2022-09-18 | Disposition: A | Discharge status: Home / Self Care | Payer: Medicare Other | Source: Ambulatory Visit | Attending: Internal Medicine | Admitting: Internal Medicine

## 2022-09-18 DIAGNOSIS — I5022 Chronic systolic (congestive) heart failure: Secondary | ICD-10-CM

## 2022-09-18 LAB — GLUCOSE, CAPILLARY
Glucose-Capillary: 175 mg/dL — ABNORMAL HIGH (ref 70–99)
Glucose-Capillary: 83 mg/dL (ref 70–99)

## 2022-09-18 NOTE — Progress Notes (Signed)
Daily Session Note  Patient Details  Name: Heather [name] MRN: [id number] Date of Birth: Dec 18, 1940 Referring Provider:   Flowsheet Row CARDIAC REHAB PHASE II ORIENTATION from 09/14/2022 in [name] CARD[facility]AC [facility]  Referring Provider Luane School MD       Encounter Date: 09/18/2022  Check In:  Session Check In - 09/18/22 0958       Check-In   Supervising physician immediately available to respond to emergencies [facility] MD immediately available    Physician(s) Dr. Jenene Slicker    Location [facility]    Staff Present Ross Ludwig, BS, Exercise Physiologist;Daphyne Daphine Deutscher, RN, BSN    Virtual Visit No    Medication changes reported     No    Fall or balance concerns reported    No    Warm-up and Cool-down Performed on first and last piece of equipment    Resistance Training Performed Yes    VAD Patient? No    PAD/SET Patient? No      Pain Assessment   Currently in Pain? No/denies             Capillary Blood Glucose: No results found for this or any previous visit (from the past 24 hour(s)).    Social History   Tobacco Use  Smoking Status Former   Current packs/day: 0.00   Average packs/day: 1 pack/day for 40.0 years (40.0 ttl pk-yrs)   Types: Cigarettes   Start date: 03/20/1945   Quit date: 03/20/1985   Years since quitting: 37.5  Smokeless Tobacco Never    Goals Met:  Exercise tolerated well No report of concerns or symptoms today Strength training completed today  Goals Unmet:  Not Applicable  Comments: First full day of exercise!  Patient was oriented to gym and equipment including functions, settings, policies, and procedures.  Patient's individual exercise prescription and treatment plan were reviewed.  All starting workloads were established based on the results of the 6 minute walk test done at initial orientation visit.  The plan for exercise progression was also introduced and progression will be customized based on  patient's performance and goals.    Dr. Dina Rich is Medical Director for [facility] Cardiac Rehab

## 2022-09-20 ENCOUNTER — Encounter (HOSPITAL_COMMUNITY): Payer: Self-pay | Admitting: *Deleted

## 2022-09-20 ENCOUNTER — Encounter (HOSPITAL_COMMUNITY)
Admission: RE | Admit: 2022-09-20 | Discharge: 2022-09-20 | Disposition: A | Discharge status: Home / Self Care | Payer: Medicare Other | Source: Ambulatory Visit | Attending: Internal Medicine | Admitting: Internal Medicine

## 2022-09-20 DIAGNOSIS — I5022 Chronic systolic (congestive) heart failure: Secondary | ICD-10-CM

## 2022-09-20 NOTE — Progress Notes (Signed)
Cardiac Individual Treatment Plan  Patient Details  Name: Heather [name] MRN: [id number] Date of Birth: 12-28-1940 Referring Provider:   Flowsheet Row CARDIAC REHAB PHASE II ORIENTATION from 09/14/2022 in [facility] CARDIAC REHABILITATION  Referring Provider Luane School MD       Initial Encounter Date:  Flowsheet Row CARDIAC REHAB PHASE II ORIENTATION from 09/14/2022 in [place] CARDIAC REHABILITATION  Date 09/14/22       Visit Diagnosis: Heart failure, chronic systolic (HCC)  Patient's Home Medications on Admission:  Current Outpatient Medications:    acetaminophen (TYLENOL) 500 MG tablet, Take 500 mg by mouth every 6 (six) hours as needed for mild pain or moderate pain., Disp: , Rfl:    allopurinol (ZYLOPRIM) 300 MG tablet, TAKE 1/2 TABLET BY MOUTH ONCE DAILY, Disp: 45 tablet, Rfl: 0   aspirin (ASPIRIN CHILDRENS) 81 MG chewable tablet, Chew 1 tablet (81 mg total) by mouth daily., Disp: 90 tablet, Rfl: 3   atorvastatin (LIPITOR) 20 MG tablet, Take 1 tablet (20 mg total) by mouth daily., Disp: 90 tablet, Rfl: 3   bisoprolol (ZEBETA) 5 MG tablet, Take 2.5 mg by mouth daily., Disp: , Rfl:    chlorpheniramine (CHLOR-TRIMETON) 4 MG tablet, Take 4 mg by mouth daily as needed for allergies., Disp: , Rfl:    Cholecalciferol (DIALYVITE VITAMIN D 5000 PO), Take 5,000 Units by mouth daily., Disp: , Rfl:    dapagliflozin propanediol (FARXIGA) 10 MG TABS tablet, Take 1 tablet (10 mg total) by mouth daily., Disp: 90 tablet, Rfl: 3   digoxin (LANOXIN) 0.125 MG tablet, Take 0.5 tablets (0.0625 mg total) by mouth daily., Disp: 90 tablet, Rfl: 3   famotidine (PEPCID) 20 MG tablet, Take 1 tablet (20 mg total) by mouth at bedtime. One after supper (Patient taking differently: Take 20 mg by mouth 2 (two) times daily.), Disp: 30 tablet, Rfl: 3   Fluticasone-Umeclidin-Vilant (TRELEGY ELLIPTA) 200-62.5-25 MCG/ACT AEPB, Inhale 1 puff into the lungs daily., Disp: 1 each, Rfl: 0   furosemide (LASIX)  40 MG tablet, Take 60 mg by mouth daily., Disp: , Rfl:    hydrALAZINE (APRESOLINE) 25 MG tablet, Take 1 tablet (25 mg total) by mouth 3 (three) times daily., Disp: 180 tablet, Rfl: 3   HYDROcodone-acetaminophen (NORCO/VICODIN) 5-325 MG tablet, Take 1 tablet by mouth every 6 (six) hours as needed for moderate pain., Disp: 120 tablet, Rfl: 0   isosorbide mononitrate (IMDUR) 30 MG 24 hr tablet, Take 1 tablet (30 mg total) by mouth daily., Disp: 90 tablet, Rfl: 3   ketoconazole (NIZORAL) 2 % cream, Apply 1 Application topically as needed for irritation., Disp: , Rfl:    losartan (COZAAR) 25 MG tablet, Take 1 tablet (25 mg total) by mouth daily., Disp: 90 tablet, Rfl: 3   Multiple Vitamins-Minerals (MULTIVITAMIN WITH MINERALS) tablet, Take 1 tablet by mouth daily., Disp: , Rfl:    PROAIR HFA 108 (90 Base) MCG/ACT inhaler, Inhale 2 puffs into the lungs every 6 (six) hours as needed for wheezing or shortness of breath., Disp: 18 g, Rfl: 2   Propylene Glycol (SYSTANE COMPLETE) 0.6 % SOLN, Place 1 drop into both eyes 2 (two) times daily., Disp: , Rfl:    spironolactone (ALDACTONE) 25 MG tablet, Take 1 tablet (25 mg total) by mouth daily., Disp: 30 tablet, Rfl: 5   venlafaxine XR (EFFEXOR-XR) 75 MG 24 hr capsule, Take 1 capsule (75 mg total) by mouth See admin instructions., Disp: 30 capsule, Rfl: 5  Past Medical History: Past Medical History:  Diagnosis Date   Anemia    Anxiety    Arthritis    COPD (chronic obstructive pulmonary disease) (HCC)    CVA (cerebral vascular accident) (HCC)    Depression    Dry eyes 04/14/2014   GERD (gastroesophageal reflux disease)    Glaucoma    Gout    HTN (hypertension)    OSA (obstructive sleep apnea)     Tobacco Use: Social History   Tobacco Use  Smoking Status Former   Current packs/day: 0.00   Average packs/day: 1 pack/day for 40.0 years (40.0 ttl pk-yrs)   Types: Cigarettes   Start date: 03/20/1945   Quit date: 03/20/1985   Years since quitting: 37.5   Smokeless Tobacco Never    Labs: Review Flowsheet  More data exists      Latest Ref Rng & Units 03/18/2022 03/19/2022 03/20/2022 05/26/2022 08/17/2022  Labs for ITP Cardiac and Pulmonary Rehab  Cholestrol 0 - 200 mg/dL - - - 914  782   LDL (calc) 0 - 99 mg/dL - - - 82  81   HDL-C >95 mg/dL - - - 65  82   Trlycerides <150 mg/dL - - - 58  45   Hemoglobin A1c 4.8 - 5.6 % - - - 6.5  -  O2 Saturation % 78.3  72.2  59.1  - -    Details            Capillary Blood Glucose: Lab Results  Component Value Date   GLUCAP 83 09/18/2022   GLUCAP 175 (H) 09/18/2022     Exercise Target Goals: Exercise Program Goal: Individual exercise prescription set using results from initial 6 min walk test and THRR while considering  patient's activity barriers and safety.   Exercise Prescription Goal: Starting with aerobic activity 30 plus minutes a day, 3 days per week for initial exercise prescription. Provide home exercise prescription and guidelines that participant acknowledges understanding prior to discharge.  Activity Barriers & Risk Stratification:  Activity Barriers & Cardiac Risk Stratification - 09/14/22 1236       Activity Barriers & Cardiac Risk Stratification   Activity Barriers Left Hip Replacement;Right Hip Replacement;Right Knee Replacement;Joint Problems;Assistive Device;History of Falls;Balance Concerns   L shoulder replacement   Cardiac Risk Stratification High             6 Minute Walk:  6 Minute Walk     Row Name 09/14/22 1402         6 Minute Walk   Phase Initial     Distance 828 feet     Walk Time 6 minutes     # of Rest Breaks 0     MPH 1.57     METS 1.54     RPE 11     VO2 Peak 5.3     Symptoms Yes (comment)     Comments R hip pain 5/10     Resting HR 63 bpm     Resting BP 122/62     Resting Oxygen Saturation  99 %     Exercise Oxygen Saturation  during 6 min walk 96 %     Max Ex. HR 89 bpm     Max Ex. BP 134/72     2 Minute Post BP 124/70               Oxygen Initial Assessment:   Oxygen Re-Evaluation:   Oxygen Discharge (Final Oxygen Re-Evaluation):   Initial Exercise Prescription:  Initial Exercise Prescription - 09/14/22 1400  Date of Initial Exercise RX and Referring Provider   Date 09/14/22    Referring Provider Luane School MD      Oxygen   Maintain Oxygen Saturation 88% or higher      Treadmill   MPH 1.3    Grade 0.5    Minutes 15    METs 2.08      NuStep   Level 1    SPM 80    Minutes 15    METs 2      Prescription Details   Frequency (times per week) 3    Duration Progress to 30 minutes of continuous aerobic without signs/symptoms of physical distress      Intensity   THRR 40-80% of Max Heartrate 94-124    Ratings of Perceived Exertion 11-13    Perceived Dyspnea 0-4      Progression   Progression Continue to progress workloads to maintain intensity without signs/symptoms of physical distress.      Resistance Training   Training Prescription Yes    Weight 2 lb    Reps 10-15             Perform Capillary Blood Glucose checks as needed.  Exercise Prescription Changes:   Exercise Prescription Changes     Row Name 09/14/22 1400 09/18/22 1300           Response to Exercise   Blood Pressure (Admit) 122/62 124/62      Blood Pressure (Exercise) 134/72 120/64      Blood Pressure (Exit) 124/70 98/50      Heart Rate (Admit) 653 bpm 71 bpm      Heart Rate (Exercise) 89 bpm 90 bpm      Heart Rate (Exit) 69 bpm 68 bpm      Oxygen Saturation (Admit) 99 % --      Oxygen Saturation (Exercise) 96 % --      Rating of Perceived Exertion (Exercise) 11 13      Symptoms R hip pain 5/10 --      Comments walk test results --      Duration -- Continue with 45 min of aerobic exercise without signs/symptoms of physical distress.      Intensity -- THRR unchanged        Progression   Progression -- Continue to progress workloads to maintain intensity without signs/symptoms of  physical distress.        Resistance Training   Training Prescription -- Yes      Weight -- 2      Reps -- 10-15        Treadmill   MPH -- 1      Grade -- 0      Minutes -- 15      METs -- 1.77        NuStep   Level -- 1      SPM -- 55      Minutes -- 15      METs -- 1.6        Oxygen   Maintain Oxygen Saturation -- 88% or higher               Exercise Comments:   Exercise Comments     Row Name 09/18/22 1000           Exercise Comments First full day of exercise!  Patient was oriented to gym and equipment including functions, settings, policies, and procedures.  Patient's individual exercise prescription and treatment plan were reviewed.  All starting workloads were established based on the results of the 6 minute walk test done at initial orientation visit.  The plan for exercise progression was also introduced and progression will be customized based on patient's performance and goals.                Exercise Goals and Review:   Exercise Goals     Row Name 09/14/22 1405 09/18/22 1323           Exercise Goals   Increase Physical Activity Yes Yes      Intervention Provide advice, education, support and counseling about physical activity/exercise needs.;Develop an individualized exercise prescription for aerobic and resistive training based on initial evaluation findings, risk stratification, comorbidities and participant's personal goals. Provide advice, education, support and counseling about physical activity/exercise needs.;Develop an individualized exercise prescription for aerobic and resistive training based on initial evaluation findings, risk stratification, comorbidities and participant's personal goals.      Expected Outcomes Short Term: Attend rehab on a regular basis to increase amount of physical activity.;Long Term: Exercising regularly at least 3-5 days a week.;Long Term: Add in home exercise to make exercise part of routine and to increase  amount of physical activity. Short Term: Attend rehab on a regular basis to increase amount of physical activity.;Long Term: Exercising regularly at least 3-5 days a week.;Long Term: Add in home exercise to make exercise part of routine and to increase amount of physical activity.      Increase Strength and Stamina Yes Yes      Intervention Provide advice, education, support and counseling about physical activity/exercise needs.;Develop an individualized exercise prescription for aerobic and resistive training based on initial evaluation findings, risk stratification, comorbidities and participant's personal goals. Provide advice, education, support and counseling about physical activity/exercise needs.;Develop an individualized exercise prescription for aerobic and resistive training based on initial evaluation findings, risk stratification, comorbidities and participant's personal goals.      Expected Outcomes Short Term: Increase workloads from initial exercise prescription for resistance, speed, and METs.;Short Term: Perform resistance training exercises routinely during rehab and add in resistance training at home;Long Term: Improve cardiorespiratory fitness, muscular endurance and strength as measured by increased METs and functional capacity ( ) Short Term: Increase workloads from initial exercise prescription for resistance, speed, and METs.;Short Term: Perform resistance training exercises routinely during rehab and add in resistance training at home;Long Term: Improve cardiorespiratory fitness, muscular endurance and strength as measured by increased METs and functional capacity ( )      Able to understand and use rate of perceived exertion (RPE) scale Yes Yes      Intervention Provide education and explanation on how to use RPE scale Provide education and explanation on how to use RPE scale      Expected Outcomes Short Term: Able to use RPE daily in rehab to express subjective intensity  level;Long Term:  Able to use RPE to guide intensity level when exercising independently Short Term: Able to use RPE daily in rehab to express subjective intensity level;Long Term:  Able to use RPE to guide intensity level when exercising independently      Able to understand and use Dyspnea scale Yes Yes      Intervention Provide education and explanation on how to use Dyspnea scale Provide education and explanation on how to use Dyspnea scale      Expected Outcomes Short Term: Able to use Dyspnea scale daily in rehab to express subjective sense of shortness of breath during exertion;Long Term:  Able to use Dyspnea scale to guide intensity level when exercising independently Short Term: Able to use Dyspnea scale daily in rehab to express subjective sense of shortness of breath during exertion;Long Term: Able to use Dyspnea scale to guide intensity level when exercising independently      Knowledge and understanding of Target Heart Rate Range (THRR) Yes Yes      Intervention Provide education and explanation of THRR including how the numbers were predicted and where they are located for reference Provide education and explanation of THRR including how the numbers were predicted and where they are located for reference      Expected Outcomes Short Term: Able to state/look up THRR;Short Term: Able to use daily as guideline for intensity in rehab;Long Term: Able to use THRR to govern intensity when exercising independently Short Term: Able to state/look up THRR;Short Term: Able to use daily as guideline for intensity in rehab;Long Term: Able to use THRR to govern intensity when exercising independently      Able to check pulse independently Yes Yes      Intervention Provide education and demonstration on how to check pulse in carotid and radial arteries.;Review the importance of being able to check your own pulse for safety during independent exercise Provide education and demonstration on how to check pulse in  carotid and radial arteries.;Review the importance of being able to check your own pulse for safety during independent exercise      Expected Outcomes Long Term: Able to check pulse independently and accurately;Short Term: Able to explain why pulse checking is important during independent exercise Long Term: Able to check pulse independently and accurately;Short Term: Able to explain why pulse checking is important during independent exercise      Understanding of Exercise Prescription Yes Yes      Intervention Provide education, explanation, and written materials on patient's individual exercise prescription Provide education, explanation, and written materials on patient's individual exercise prescription      Expected Outcomes Short Term: Able to explain program exercise prescription;Long Term: Able to explain home exercise prescription to exercise independently Short Term: Able to explain program exercise prescription;Long Term: Able to explain home exercise prescription to exercise independently               Exercise Goals Re-Evaluation :  Exercise Goals Re-Evaluation     Row Name 09/14/22 1405 09/18/22 1323           Exercise Goal Re-Evaluation   Exercise Goals Review Understanding of Exercise Prescription;Able to understand and use Dyspnea scale;Able to understand and use rate of perceived exertion (RPE) scale Increase Physical Activity;Increase Strength and Stamina;Able to understand and use rate of perceived exertion (RPE) scale;Knowledge and understanding of Target Heart Rate Range (THRR);Able to check pulse independently;Understanding of Exercise Prescription      Comments Reviewed RPE  and dyspnea scale and program prescription with pt today.  Pt voiced understanding and was given a copy of goals to take home. Pt has completed her first day in cardiac rehab. She did well for her first day in class learing the equipment. She is slightly deconditioned and started out slower on the  treadmill and stepper. Informend the pt about achieving 80 SPM on the stepper for her first week. She is currently exercising at 1.77 METs on the treadmill. Will continue to monitor and progress as able,      Expected Outcomes Short: Use RPE daily to regulate intensity.  Long: Follow program prescription Through exercise at  rehab and home, patient wll achieve their goals,                Discharge Exercise Prescription (Final Exercise Prescription Changes):  Exercise Prescription Changes - 09/18/22 1300       Response to Exercise   Blood Pressure (Admit) 124/62    Blood Pressure (Exercise) 120/64    Blood Pressure (Exit) 98/50    Heart Rate (Admit) 71 bpm    Heart Rate (Exercise) 90 bpm    Heart Rate (Exit) 68 bpm    Rating of Perceived Exertion (Exercise) 13    Duration Continue with 45 min of aerobic exercise without signs/symptoms of physical distress.    Intensity THRR unchanged      Progression   Progression Continue to progress workloads to maintain intensity without signs/symptoms of physical distress.      Resistance Training   Training Prescription Yes    Weight 2    Reps 10-15      Treadmill   MPH 1    Grade 0    Minutes 15    METs 1.77      NuStep   Level 1    SPM 55    Minutes 15    METs 1.6      Oxygen   Maintain Oxygen Saturation 88% or higher             Nutrition:  Target Goals: Understanding of nutrition guidelines, daily intake of sodium 1500mg , cholesterol 200mg , calories 30% from fat and 7% or less from saturated fats, daily to have 5 or more servings of fruits and vegetables.  Biometrics:  Pre Biometrics - 09/14/22 1406       Pre Biometrics   Height 5' 2.5" (1.588 m)    Weight 135 lb 14.4 oz (61.6 kg)    Waist Circumference 28 inches    Hip Circumference 34 inches    Waist to Hip Ratio 0.82 %    BMI (Calculated) 24.44    Triceps Skinfold 14 mm    % Body Fat 32.2 %    Grip Strength 17.1 kg    Single Leg Stand 5 seconds               Nutrition Therapy Plan and Nutrition Goals:  Nutrition Therapy & Goals - 09/14/22 1412       Intervention Plan   Intervention Prescribe, educate and counsel regarding individualized specific dietary modifications aiming towards targeted core components such as weight, hypertension, lipid management, diabetes, heart failure and other comorbidities.;Nutrition handout(s) given to patient.    Expected Outcomes Short Term Goal: Understand basic principles of dietary content, such as calories, fat, sodium, cholesterol and nutrients.             Nutrition Assessments:  Nutrition Assessments - 09/14/22 1412       MEDFICTS Scores   Pre Score 49            MEDIFICTS Score Key: ?70 Need to make dietary changes  40-70 Heart Healthy Diet ? 40 Therapeutic Level Cholesterol Diet   Picture Your Plate Scores: <13 Unhealthy dietary pattern with much room for improvement. 41-50 Dietary pattern unlikely to meet recommendations for good health and room for improvement. 51-60 More healthful dietary pattern, with some room for improvement.  >60 Healthy dietary pattern, although there may be some specific behaviors that could be improved.    Nutrition Goals Re-Evaluation:   Nutrition Goals Discharge (Final Nutrition Goals Re-Evaluation):   Psychosocial: Target Goals: Acknowledge  presence or absence of significant depression and/or stress, maximize coping skills, provide positive support system. Participant is able to verbalize types and ability to use techniques and skills needed for reducing stress and depression.  Initial Review & Psychosocial Screening:  Initial Psych Review & Screening - 09/14/22 1239       Initial Review   Current issues with Current Psychotropic Meds;History of Depression;Current Stress Concerns    Source of Stress Concerns Financial;Unable to participate in former interests or hobbies;Unable to perform yard/household activities    Comments letter  from IRS about 2023 tax return not being filed, she is taking effexor for depression and feeling well managed currently, sleeps well uses Sleepy Time Tea, heart failure has slowed her down wants to get back to her normal      Family Dynamics   Good Support System? Yes   has a friend she relies on, 3 children (2 sons in [place], daughter in [place]) they call often and sons come to check in     Barriers   Psychosocial barriers to participate in program Psychosocial barriers identified (see note);The patient should benefit from training in stress management and relaxation.      Screening Interventions   Interventions Encouraged to exercise;To provide support and resources with identified psychosocial needs;Provide feedback about the scores to participant    Expected Outcomes Short Term goal: Utilizing psychosocial counselor, staff and physician to assist with identification of specific Stressors or current issues interfering with healing process. Setting desired goal for each stressor or current issue identified.;Long Term Goal: Stressors or current issues are controlled or eliminated.;Short Term goal: Identification and review with participant of any Quality of Life or Depression concerns found by scoring the questionnaire.;Long Term goal: The participant improves quality of Life and PHQ9 Scores as seen by post scores and/or verbalization of changes             Quality of Life Scores:  Quality of Life - 09/14/22 1411       Quality of Life   Select Quality of Life      Quality of Life Scores   Health/Function Pre 25 %    Socioeconomic Pre 24.08 %    Psych/Spiritual Pre 28 %    Family Pre 20.75 %    GLOBAL Pre 24.88 %            Scores of 19 and below usually indicate a poorer quality of life in these areas.  A difference of  2-3 points is a clinically meaningful difference.  A difference of 2-3 points in the total score of the Quality of Life Index has been associated with significant  improvement in overall quality of life, self-image, physical symptoms, and general health in studies assessing change in quality of life.  PHQ-9: Review Flowsheet  More data exists      09/14/2022 08/31/2022 05/26/2022 04/03/2022 02/02/2022  Depression screen PHQ 2/9  Decreased Interest 0 0 0 0 1 0  Down, Depressed, Hopeless 0 0 0 0 0 0  PHQ - 2 Score 0 0 0 0 1 0  Altered sleeping 0 - 2 2 0  Tired, decreased energy 1 - 0 1 2  Change in appetite 1 - 2 1 1   Feeling bad or failure about yourself  0 - 0 1 0  Trouble concentrating 0 - 0 0 0  Moving slowly or fidgety/restless 0 - 1 1 0  Suicidal thoughts 0 - 0 0 0  PHQ-9 Score 2 - 5  7 3  Difficult doing work/chores Not difficult at all - - Not difficult at all -    Details       Multiple values from one day are sorted in reverse-chronological order        Interpretation of Total Score  Total Score Depression Severity:  1-4 = Minimal depression, 5-9 = Mild depression, 10-14 = Moderate depression, 15-19 = Moderately severe depression, 20-27 = Severe depression   Psychosocial Evaluation and Intervention:  Psychosocial Evaluation - 09/14/22 1407       Psychosocial Evaluation & Interventions   Interventions Encouraged to exercise with the program and follow exercise prescription    Comments Heather [name] is coming into cardiac rehab for heart failure.  She has had two hospitalizations from heart failure over the past year.  She has not been able to go and do and exercise like she used to and would like to rebuild her strength and stamina.  She wants to get back to her normal again.  She lives alone, but has a "friend" and they check on each other frequently.  She has two sons in [place] that call regularly and come by to check in from time to time.  She also has a daughter in [place] that calls at least weekly.  She usually sleeps well using her Sleepy time tea at night.  Her biggest stressor other than her health is trying to figure her  finances as she did not file taxes for 2023 thinking she didn't need to and got a letter telling her otherwise.  Overall she is a positive and happy person.  She does have a history of depression but PHQ showed no current issues. She take effexor for her depression and is feeling well managed at this time.  She is excited to start program and does not preceive any  barriers to attending rehab.    Expected Outcomes Short: Attend rehab to build stamina Long: Be able to go and do as she wants to do again    Continue Psychosocial Services  Follow up required by staff             Psychosocial Re-Evaluation:   Psychosocial Discharge (Final Psychosocial Re-Evaluation):   Vocational Rehabilitation: Provide vocational rehab assistance to qualifying candidates.   Vocational Rehab Evaluation & Intervention:  Vocational Rehab - 09/14/22 1235       Initial Vocational Rehab Evaluation & Intervention   Assessment shows need for Vocational Rehabilitation No   retired            Education: Education Goals: Education classes will be provided on a weekly basis, covering required topics. Participant will state understanding/return demonstration of topics presented.  Learning Barriers/Preferences:  Learning Barriers/Preferences - 09/14/22 1234       Learning Barriers/Preferences   Learning Barriers Sight   glasses for reading   Learning Preferences Skilled Demonstration;Written Material             Education Topics: Hypertension, Hypertension Reduction -Define heart disease and high blood pressure. Discus how high blood pressure affects the body and ways to reduce high blood pressure.   Exercise and Your Heart -Discuss why it is important to exercise, the FITT principles of exercise, normal and abnormal responses to exercise, and how to exercise safely.   Angina -Discuss definition of angina, causes of angina, treatment of angina, and how to decrease risk of having  angina.   Cardiac Medications -Review what the following cardiac medications are used for, how they affect the  body, and side effects that may occur when taking the medications.  Medications include Aspirin, Beta blockers, calcium channel blockers, ACE Inhibitors, angiotensin receptor blockers, diuretics, digoxin, and antihyperlipidemics.   Congestive Heart Failure -Discuss the definition of CHF, how to live with CHF, the signs and symptoms of CHF, and how keep track of weight and sodium intake.   Heart Disease and Intimacy -Discus the effect sexual activity has on the heart, how changes occur during intimacy as we age, and safety during sexual activity.   Smoking Cessation / COPD -Discuss different methods to quit smoking, the health benefits of quitting smoking, and the definition of COPD.   Nutrition I: Fats -Discuss the types of cholesterol, what cholesterol does to the heart, and how cholesterol levels can be controlled.   Nutrition II: Labels -Discuss the different components of food labels and how to read food label   Heart Parts/Heart Disease and PAD -Discuss the anatomy of the heart, the pathway of blood circulation through the heart, and these are affected by heart disease.   Stress I: Signs and Symptoms -Discuss the causes of stress, how stress may lead to anxiety and depression, and ways to limit stress.   Stress II: Relaxation -Discuss different types of relaxation techniques to limit stress.   Warning Signs of Stroke / TIA -Discuss definition of a stroke, what the signs and symptoms are of a stroke, and how to identify when someone is having stroke.   Knowledge Questionnaire Score:  Knowledge Questionnaire Score - 09/14/22 1412       Knowledge Questionnaire Score   Pre Score 19/24             Core Components/Risk Factors/Patient Goals at Admission:  Personal Goals and Risk Factors at Admission - 09/14/22 1412       Core Components/Risk  Factors/Patient Goals on Admission    Weight Management Yes;Weight Maintenance    Intervention Weight Management: Develop a combined nutrition and exercise program designed to reach desired caloric intake, while maintaining appropriate intake of nutrient and fiber, sodium and fats, and appropriate energy expenditure required for the weight goal.;Weight Management: Provide education and appropriate resources to help participant work on and attain dietary goals.    Admit Weight 135 lb 14.4 oz (61.6 kg)    Goal Weight: Short Term 135 lb (61.2 kg)    Goal Weight: Long Term 135 lb (61.2 kg)    Expected Outcomes Short Term: Continue to assess and modify interventions until short term weight is achieved;Long Term: Adherence to nutrition and physical activity/exercise program aimed toward attainment of established weight goal;Weight Maintenance: Understanding of the daily nutrition guidelines, which includes 25-35% calories from fat, 7% or less cal from saturated fats, less than 200mg  cholesterol, less than 1.5gm of sodium, & 5 or more servings of fruits and vegetables daily    Diabetes Yes    Intervention Provide education about signs/symptoms and action to take for hypo/hyperglycemia.;Provide education about proper nutrition, including hydration, and aerobic/resistive exercise prescription along with prescribed medications to achieve blood glucose in normal ranges: Fasting glucose 65-99 mg/dL    Expected Outcomes Short Term: Participant verbalizes understanding of the signs/symptoms and immediate care of hyper/hypoglycemia, proper foot care and importance of medication, aerobic/resistive exercise and nutrition plan for blood glucose control.;Long Term: Attainment of HbA1C < 7%.    Heart Failure Yes    Intervention Provide a combined exercise and nutrition program that is supplemented with education, support and counseling about heart failure. Directed toward relieving symptoms  such as shortness of breath,  decreased exercise tolerance, and extremity edema.    Expected Outcomes Improve functional capacity of life;Short term: Attendance in program 2-3 days a week with increased exercise capacity. Reported lower sodium intake. Reported increased fruit and vegetable intake. Reports medication compliance.;Short term: Daily weights obtained and reported for increase. Utilizing diuretic protocols set by physician.;Long term: Adoption of self-care skills and reduction of barriers for early signs and symptoms recognition and intervention leading to self-care maintenance.    Hypertension Yes    Intervention Provide education on lifestyle modifcations including regular physical activity/exercise, weight management, moderate sodium restriction and increased consumption of fresh fruit, vegetables, and low fat dairy, alcohol moderation, and smoking cessation.;Monitor prescription use compliance.    Expected Outcomes Short Term: Continued assessment and intervention until BP is < 140/65mm HG in hypertensive participants. < 130/65mm HG in hypertensive participants with diabetes, heart failure or chronic kidney disease.;Long Term: Maintenance of blood pressure at goal levels.    Lipids Yes    Intervention Provide education and support for participant on nutrition & aerobic/resistive exercise along with prescribed medications to achieve LDL 70mg , HDL >40mg .    Expected Outcomes Short Term: Participant states understanding of desired cholesterol values and is compliant with medications prescribed. Participant is following exercise prescription and nutrition guidelines.;Long Term: Cholesterol controlled with medications as prescribed, with individualized exercise RX and with personalized nutrition plan. Value goals: LDL < 70mg , HDL > 40 mg.             Core Components/Risk Factors/Patient Goals Review:    Core Components/Risk Factors/Patient Goals at Discharge (Final Review):    ITP Comments:  ITP Comments      Row Name 09/14/22 1401 09/18/22 1000 09/20/22 0843       ITP Comments Patient attend orientation today.  Patient is attendingCardiac Rehabilitation Program.  Documentation for diagnosis can be found in [facility] encounter 08/18/22.  Reviewed medical chart, RPE, gym safety, and program guidelines.  Patient was fitted to equipment they will be using during rehab.  Patient is scheduled to start exercise on 09/18/22. First full day of exercise!  Patient was oriented to gym and equipment including functions, settings, policies, and procedures.  Patient's individual exercise prescription and treatment plan were reviewed.  All starting workloads were established based on the results of the 6 minute walk test done at initial orientation visit.  The plan for exercise progression was also introduced and progression will be customized based on patient's performance and goals. 30 day review completed. ITP sent to Dr. Dina Rich, Medical Director of Cardiac Rehab. Continue with ITP unless changes are made by physician.  New to program.              Comments: 30 day review

## 2022-09-20 NOTE — Progress Notes (Signed)
Daily Session Note  Patient Details  Name: Heather [name] MRN: [id number] Date of Birth: 07-30-1940 Referring Provider:   Flowsheet Row CARDIAC REHAB PHASE II ORIENTATION from 09/14/2022 in [facility] CARDIAC REHABILITATION  Referring Provider Luane School MD       Encounter Date: 09/20/2022  Check In:  Session Check In - 09/20/22 0930       Check-In   Supervising physician immediately available to respond to emergencies [facility] MD immediately available    Physician(s) Dr. Jenene Slicker    Location [facility]    Staff Present Fabio Pierce, MA, RCEP, CCRP, Harolyn Rutherford, RN, BSN    Virtual Visit No    Medication changes reported     No    Fall or balance concerns reported    No    Tobacco Cessation No Change    Warm-up and Cool-down Performed as group-led instruction    Resistance Training Performed Yes    VAD Patient? No      Pain Assessment   Currently in Pain? No/denies             Capillary Blood Glucose: No results found for this or any previous visit (from the past 24 hour(s)).    Social History   Tobacco Use  Smoking Status Former   Current packs/day: 0.00   Average packs/day: 1 pack/day for 40.0 years (40.0 ttl pk-yrs)   Types: Cigarettes   Start date: 03/20/1945   Quit date: 03/20/1985   Years since quitting: 37.5  Smokeless Tobacco Never    Goals Met:  Independence with exercise equipment Exercise tolerated well No report of concerns or symptoms today Strength training completed today  Goals Unmet:  Not Applicable  Comments: Pt able to follow exercise prescription today without complaint.  Will continue to monitor for progression.    Dr. Dina Rich is Medical Director for [facility] Cardiac Rehab

## 2022-09-22 ENCOUNTER — Telehealth (HOSPITAL_COMMUNITY): Payer: Self-pay | Admitting: *Deleted

## 2022-09-22 ENCOUNTER — Encounter (HOSPITAL_COMMUNITY): Payer: Medicare Other

## 2022-09-22 NOTE — Telephone Encounter (Signed)
Unable to hold  rehab classes due to monitor outage.

## 2022-09-25 ENCOUNTER — Encounter (HOSPITAL_COMMUNITY)
Admission: RE | Admit: 2022-09-25 | Discharge: 2022-09-25 | Disposition: A | Discharge status: Home / Self Care | Payer: Medicare Other | Source: Ambulatory Visit | Attending: Internal Medicine | Admitting: Internal Medicine

## 2022-09-25 ENCOUNTER — Telehealth: Payer: Self-pay

## 2022-09-25 DIAGNOSIS — I5022 Chronic systolic (congestive) heart failure: Secondary | ICD-10-CM | POA: Diagnosis not present

## 2022-09-25 LAB — GLUCOSE, CAPILLARY
Glucose-Capillary: 100 mg/dL — ABNORMAL HIGH (ref 70–99)
Glucose-Capillary: 79 mg/dL (ref 70–99)

## 2022-09-25 NOTE — Telephone Encounter (Signed)
Following alert received from [employer] received for AMS show FF oversensing, presenting inappropriately mode switched for FF.  Reviewed with Darrol Angel. Jude who advises based on presenting PVAB should be programmed to 180 ms-190 ms. Note placed in apt with Dr. Graciela Husbands 10/19/22 to assess.

## 2022-09-25 NOTE — Progress Notes (Signed)
Daily Session Note  Patient Details  Name: Heather [name] MRN: [id number] Date of Birth: [birth year] Referring Provider:   Flowsheet Row CARDIAC REHAB PHASE II ORIENTATION from 09/14/2022 in [facility] CARDIAC REHABILITATION  Referring Provider Luane School MD       Encounter Date: 09/25/2022  Check In:  Session Check In - 09/25/22 0930       Check-In   Supervising physician immediately available to respond to emergencies See telemetry face sheet for immediately available MD    Location [facility]    Staff Present Fabio Pierce, MA, RCEP, CCRP, Harolyn Rutherford, RN, BSN;Heather Fredric Mare, BS, Exercise Physiologist    Virtual Visit No    Medication changes reported     No    Fall or balance concerns reported    No    Tobacco Cessation No Change    Warm-up and Cool-down Performed on first and last piece of equipment    Resistance Training Performed Yes    VAD Patient? No      Pain Assessment   Currently in Pain? No/denies             Capillary Blood Glucose: Results for orders placed or performed during the hospital encounter of 09/20/22 (from the past 24 hour(s))  Glucose, capillary     Status: Abnormal   Collection Time: 09/25/22  9:20 AM  Result Value Ref Range   Glucose-Capillary 100 (H) 70 - 99 mg/dL      Social History   Tobacco Use  Smoking Status Former   Current packs/day: 0.00   Average packs/day: 1 pack/day for 40.0 years (40.0 ttl pk-yrs)   Types: Cigarettes   Start date: 03/20/1945   Quit date: 03/20/1985   Years since quitting: 37.5  Smokeless Tobacco Never    Goals Met:  Independence with exercise equipment Exercise tolerated well No report of concerns or symptoms today Strength training completed today  Goals Unmet:  Not Applicable  Comments: exercise    Dr. Dina Rich is Medical Director for [facility] Cardiac Rehab

## 2022-09-26 ENCOUNTER — Ambulatory Visit: Payer: Medicare Other | Admitting: Physical Medicine & Rehabilitation

## 2022-09-27 ENCOUNTER — Encounter (HOSPITAL_COMMUNITY): Payer: Medicare Other

## 2022-09-29 ENCOUNTER — Encounter (HOSPITAL_COMMUNITY)
Admission: RE | Admit: 2022-09-29 | Discharge: 2022-09-29 | Disposition: A | Discharge status: Home / Self Care | Payer: Medicare Other | Source: Ambulatory Visit | Attending: Internal Medicine | Admitting: Internal Medicine

## 2022-09-29 DIAGNOSIS — I5022 Chronic systolic (congestive) heart failure: Secondary | ICD-10-CM

## 2022-09-29 LAB — GLUCOSE, CAPILLARY
Glucose-Capillary: 129 mg/dL — ABNORMAL HIGH (ref 70–99)
Glucose-Capillary: 73 mg/dL (ref 70–99)

## 2022-09-29 NOTE — Progress Notes (Signed)
Daily Session Note  Patient Details  Name: Heather [name] MRN: [id number] Date of Birth: 02-24-1941 Referring Provider:   Flowsheet Row CARDIAC REHAB PHASE II ORIENTATION from 09/14/2022 in [facility] CARDIAC REHABILITATION  Referring Provider Luane School MD       Encounter Date: 09/29/2022  Check In:  Session Check In - 09/29/22 0930       Check-In   Supervising physician immediately available to respond to emergencies See telemetry face sheet for immediately available MD    Location [facility]    Staff Present Ross Ludwig, BS, Exercise Physiologist;Jessica Perdido, MA, RCEP, CCRP, Dow Adolph, RN, Pleas Koch, RN, BSN    Virtual Visit No    Medication changes reported     No    Fall or balance concerns reported    No    Warm-up and Cool-down Performed on first and last piece of equipment    Resistance Training Performed Yes    VAD Patient? No      Pain Assessment   Currently in Pain? No/denies             Capillary Blood Glucose: Results for orders placed or performed during the hospital encounter of 09/29/22 (from the past 24 hour(s))  Glucose, capillary     Status: Abnormal   Collection Time: 09/29/22  9:21 AM  Result Value Ref Range   Glucose-Capillary 129 (H) 70 - 99 mg/dL      Social History   Tobacco Use  Smoking Status Former   Current packs/day: 0.00   Average packs/day: 1 pack/day for 40.0 years (40.0 ttl pk-yrs)   Types: Cigarettes   Start date: 03/20/1945   Quit date: 03/20/1985   Years since quitting: 37.5  Smokeless Tobacco Never    Goals Met:  Independence with exercise equipment Exercise tolerated well No report of concerns or symptoms today Strength training completed today  Goals Unmet:  Not Applicable  Comments: Pt able to follow exercise prescription today without complaint.  Will continue to monitor for progression.    Dr. Dina Rich is Medical Director for [facility] Cardiac  Rehab

## 2022-10-02 ENCOUNTER — Encounter (HOSPITAL_COMMUNITY)
Admission: RE | Admit: 2022-10-02 | Discharge: 2022-10-02 | Disposition: A | Discharge status: Home / Self Care | Payer: Medicare Other | Source: Ambulatory Visit | Attending: Internal Medicine | Admitting: Internal Medicine

## 2022-10-02 DIAGNOSIS — I5022 Chronic systolic (congestive) heart failure: Secondary | ICD-10-CM

## 2022-10-02 NOTE — Progress Notes (Signed)
Daily Session Note  Patient Details  Name: Heather [name] MRN: [id number] Date of Birth: 12/17/1940 Referring Provider:   Flowsheet Row CARDIAC REHAB PHASE II ORIENTATION from 09/14/2022 in [facility] CARDIAC REHABILITATION  Referring Provider Luane School MD       Encounter Date: 10/02/2022  Check In:  Session Check In - 10/02/22 0930       Check-In   Supervising physician immediately available to respond to emergencies See telemetry face sheet for immediately available MD    Location [facility]    Staff Present Ross Ludwig, BS, Exercise Physiologist;Natividad Halls Daphine Deutscher, RN, BSN    Virtual Visit No    Medication changes reported     No    Fall or balance concerns reported    No    Tobacco Cessation No Change    Warm-up and Cool-down Performed on first and last piece of equipment    Resistance Training Performed Yes    VAD Patient? No      Pain Assessment   Currently in Pain? No/denies             Capillary Blood Glucose: No results found for this or any previous visit (from the past 24 hour(s)).    Social History   Tobacco Use  Smoking Status Former   Current packs/day: 0.00   Average packs/day: 1 pack/day for 40.0 years (40.0 ttl pk-yrs)   Types: Cigarettes   Start date: 03/20/1945   Quit date: 03/20/1985   Years since quitting: 37.5  Smokeless Tobacco Never    Goals Met:  Independence with exercise equipment Exercise tolerated well No report of concerns or symptoms today Strength training completed today  Goals Unmet:  Not Applicable  Comments: Pt able to follow exercise prescription today without complaint.  Will continue to monitor for progression.    Dr. Dina Rich is Medical Director for [facility] Cardiac Rehab

## 2022-10-03 ENCOUNTER — Telehealth: Payer: Self-pay | Admitting: Pulmonary Disease

## 2022-10-03 NOTE — Telephone Encounter (Signed)
Patient is in the donut hole and requesting Trelegy 200 samples. Patient has 5 days left. Please advise.

## 2022-10-04 ENCOUNTER — Encounter (HOSPITAL_COMMUNITY)
Admission: RE | Admit: 2022-10-04 | Discharge: 2022-10-04 | Disposition: A | Discharge status: Home / Self Care | Payer: Medicare Other | Source: Ambulatory Visit | Attending: Internal Medicine | Admitting: Internal Medicine

## 2022-10-04 DIAGNOSIS — I5022 Chronic systolic (congestive) heart failure: Secondary | ICD-10-CM | POA: Diagnosis not present

## 2022-10-04 NOTE — Progress Notes (Signed)
Daily Session Note  Patient Details  Name: Heather [name] MRN: [id number] Date of Birth: [birth year] Referring Provider:   Flowsheet Row CARDIAC REHAB PHASE II ORIENTATION from 09/14/2022 in [facility] CARDIAC REHABILITATION  Referring Provider Luane School MD       Encounter Date: 10/04/2022  Check In:  Session Check In - 10/04/22 0930       Check-In   Supervising physician immediately available to respond to emergencies See telemetry face sheet for immediately available MD    Physician(s) Dr. [name]    Location [facility]    Staff Present Ross Ludwig, BS, Exercise Physiologist;Hillary Troutman BSN, RN;Jessica Trinity, MA, RCEP, CCRP, CCET;Daphyne Daphine Deutscher, RN, BSN    Virtual Visit No    Medication changes reported     No    Fall or balance concerns reported    No    Tobacco Cessation No Change    Warm-up and Cool-down Performed on first and last piece of equipment    Resistance Training Performed Yes    VAD Patient? No    PAD/SET Patient? No      Pain Assessment   Currently in Pain? No/denies    Multiple Pain Sites No             Capillary Blood Glucose: No results found for this or any previous visit (from the past 24 hour(s)).    Social History   Tobacco Use  Smoking Status Former   Current packs/day: 0.00   Average packs/day: 1 pack/day for 40.0 years (40.0 ttl pk-yrs)   Types: Cigarettes   Start date: 03/20/1945   Quit date: 03/20/1985   Years since quitting: 37.5  Smokeless Tobacco Never    Goals Met:  Independence with exercise equipment Exercise tolerated well No report of concerns or symptoms today Strength training completed today  Goals Unmet:  Not Applicable  Comments: Pt able to follow exercise prescription today without complaint.  Will continue to monitor for progression.    Dr. Dina Rich is Medical Director for [facility] Cardiac Rehab

## 2022-10-06 ENCOUNTER — Encounter (HOSPITAL_COMMUNITY)
Admission: RE | Admit: 2022-10-06 | Discharge: 2022-10-06 | Disposition: A | Discharge status: Home / Self Care | Payer: Medicare Other | Source: Ambulatory Visit | Attending: Internal Medicine | Admitting: Internal Medicine

## 2022-10-06 DIAGNOSIS — I5022 Chronic systolic (congestive) heart failure: Secondary | ICD-10-CM | POA: Insufficient documentation

## 2022-10-06 DIAGNOSIS — M25551 Pain in right hip: Secondary | ICD-10-CM | POA: Insufficient documentation

## 2022-10-06 NOTE — Progress Notes (Signed)
Daily Session Note  Patient Details  Name: Heather [name] MRN: [id number] Date of Birth: 1940/08/01 Referring Provider:   Flowsheet Row CARDIAC REHAB PHASE II ORIENTATION from 09/14/2022 in [facility] CARDIAC REHABILITATION  Referring Provider Luane School MD       Encounter Date: 10/06/2022  Check In:  Session Check In - 10/06/22 0932       Check-In   Supervising physician immediately available to respond to emergencies See telemetry face sheet for immediately available MD    Location [facility]    Staff Present Ross Ludwig, BS, Exercise Physiologist;Jessica Vadito, MA, RCEP, CCRP, Harolyn Rutherford, RN, BSN    Virtual Visit No    Medication changes reported     No    Fall or balance concerns reported    No    Tobacco Cessation No Change    Warm-up and Cool-down Performed on first and last piece of equipment    Resistance Training Performed Yes      Pain Assessment   Currently in Pain? No/denies             Capillary Blood Glucose: No results found for this or any previous visit (from the past 24 hour(s)).    Social History   Tobacco Use  Smoking Status Former   Current packs/day: 0.00   Average packs/day: 1 pack/day for 40.0 years (40.0 ttl pk-yrs)   Types: Cigarettes   Start date: 03/20/1945   Quit date: 03/20/1985   Years since quitting: 37.5  Smokeless Tobacco Never    Goals Met:  Independence with exercise equipment Exercise tolerated well No report of concerns or symptoms today Strength training completed today  Goals Unmet:  Not Applicable  Comments: Pt able to follow exercise prescription today without complaint.  Will continue to monitor for progression.    Dr. Dina Rich is Medical Director for [facility] Cardiac Rehab

## 2022-10-09 ENCOUNTER — Telehealth: Payer: Self-pay | Admitting: *Deleted

## 2022-10-09 ENCOUNTER — Encounter (HOSPITAL_COMMUNITY): Admission: RE | Admit: 2022-10-09 | Payer: Medicare Other | Source: Ambulatory Visit

## 2022-10-09 DIAGNOSIS — M25551 Pain in right hip: Secondary | ICD-10-CM | POA: Diagnosis not present

## 2022-10-09 DIAGNOSIS — I5022 Chronic systolic (congestive) heart failure: Secondary | ICD-10-CM | POA: Diagnosis not present

## 2022-10-09 NOTE — Telephone Encounter (Signed)
Patient given 2 samples of Trelegy .

## 2022-10-09 NOTE — Telephone Encounter (Signed)
Called pt to notify that she was denied by [employer] for [place] d/t having commercial insurance. Will supply pt with co-pay card for Farxiga. No answer. Left msg to call back.

## 2022-10-09 NOTE — Progress Notes (Addendum)
Daily Session Note  Patient Details  Name: Heather [name] MRN: [id number] Date of Birth: January 19, 1941 Referring Provider:   Flowsheet Row CARDIAC REHAB PHASE II ORIENTATION from 09/14/2022 in [facility] CARDIAC REHABILITATION  Referring Provider Luane School MD       Encounter Date: 10/09/2022  Check In:  Session Check In - 10/09/22 4742       Check-In   Supervising physician immediately available to respond to emergencies See telemetry face sheet for immediately available ER MD    Location [facility]    Staff Present Fabio Pierce, MA, RCEP, CCRP, CCET    Virtual Visit No    Medication changes reported     No    Fall or balance concerns reported    No    Tobacco Cessation No Change    Warm-up and Cool-down Performed on first and last piece of equipment    Resistance Training Performed Yes    VAD Patient? No    PAD/SET Patient? No      Pain Assessment   Currently in Pain? No/denies             Capillary Blood Glucose: No results found for this or any previous visit (from the past 24 hour(s)).    Social History   Tobacco Use  Smoking Status Former   Current packs/day: 0.00   Average packs/day: 1 pack/day for 40.0 years (40.0 ttl pk-yrs)   Types: Cigarettes   Start date: 03/20/1945   Quit date: 03/20/1985   Years since quitting: 37.5  Smokeless Tobacco Never    Goals Met:  Independence with exercise equipment Exercise tolerated well No report of concerns or symptoms today  Goals Unmet:  Not Applicable  Comments: Pt able to follow exercise prescription today without complaint.  Will continue to monitor for progression.    Dr. Dina Rich is Medical Director for [facility] Cardiac Rehab

## 2022-10-10 DIAGNOSIS — R0902 Hypoxemia: Secondary | ICD-10-CM | POA: Diagnosis not present

## 2022-10-10 DIAGNOSIS — G4730 Sleep apnea, unspecified: Secondary | ICD-10-CM | POA: Diagnosis not present

## 2022-10-11 ENCOUNTER — Encounter (HOSPITAL_COMMUNITY)
Admission: RE | Admit: 2022-10-11 | Discharge: 2022-10-11 | Disposition: A | Discharge status: Home / Self Care | Payer: Medicare Other | Source: Ambulatory Visit | Attending: Internal Medicine | Admitting: Internal Medicine

## 2022-10-11 DIAGNOSIS — I5022 Chronic systolic (congestive) heart failure: Secondary | ICD-10-CM

## 2022-10-11 DIAGNOSIS — M25551 Pain in right hip: Secondary | ICD-10-CM | POA: Diagnosis not present

## 2022-10-11 NOTE — Progress Notes (Signed)
Daily Session Note  Patient Details  Name: Heather [name] MRN: [id number] Date of Birth: 04-27-1940 Referring Provider:   Flowsheet Row CARDIAC REHAB PHASE II ORIENTATION from 09/14/2022 in [facility] CARDIAC REHABILITATION  Referring Provider Luane School MD       Encounter Date: 10/11/2022  Check In:  Session Check In - 10/11/22 0915       Check-In   Supervising physician immediately available to respond to emergencies See telemetry face sheet for immediately available ER MD    Location [facility]    Staff Present Fabio Pierce, MA, RCEP, CCRP, Dow Adolph, RN, Pleas Koch, RN, BSN    Virtual Visit No    Medication changes reported     No    Fall or balance concerns reported    No    Warm-up and Cool-down Performed on first and last piece of equipment    Resistance Training Performed Yes    VAD Patient? No    PAD/SET Patient? No      Pain Assessment   Currently in Pain? No/denies    Pain Score 0-No pain    Multiple Pain Sites No             Capillary Blood Glucose: No results found for this or any previous visit (from the past 24 hour(s)).    Social History   Tobacco Use  Smoking Status Former   Current packs/day: 0.00   Average packs/day: 1 pack/day for 40.0 years (40.0 ttl pk-yrs)   Types: Cigarettes   Start date: 03/20/1945   Quit date: 03/20/1985   Years since quitting: 37.5  Smokeless Tobacco Never    Goals Met:  Independence with exercise equipment Exercise tolerated well No report of concerns or symptoms today Strength training completed today  Goals Unmet:  Not Applicable  Comments: Check out 130.   Dr. Dina Rich is Medical Director for [facility] Cardiac Rehab

## 2022-10-11 NOTE — Telephone Encounter (Signed)
Received a fax from [employer] stating that pt was approved. Pt approved from 10/11/22 through 03/06/23.

## 2022-10-12 ENCOUNTER — Telehealth (HOSPITAL_COMMUNITY): Payer: Self-pay | Admitting: Internal Medicine

## 2022-10-12 ENCOUNTER — Ambulatory Visit (INDEPENDENT_AMBULATORY_CARE_PROVIDER_SITE_OTHER): Payer: Medicare Other

## 2022-10-12 DIAGNOSIS — I428 Other cardiomyopathies: Secondary | ICD-10-CM

## 2022-10-12 DIAGNOSIS — I5022 Chronic systolic (congestive) heart failure: Secondary | ICD-10-CM | POA: Diagnosis not present

## 2022-10-12 NOTE — Telephone Encounter (Addendum)
-----   Message from Vishnu P Mallipeddi sent at 10/12/2022 12:00 PM EDT ----- Regarding: RE: hypotension Patient is currently followed by advanced heart failure team. I am copying them here. ----- Message ----- From: Suann Larry, RN Sent: 10/09/2022  11:26 AM EDT To: Marjo Bicker, MD Subject: hypotension                                    Hey Dr. Jenene Slicker,   Ms. Morosky is participating in cardiac rehab. Her resting and post exercise blood pressure is low. Her systolic is usually below 90 until we give her water. She does say she feels light headed and "not right in her head" at times. She does not complain of those symptoms with [place] but says she feels this at home occasionally.   Thanks!  Eunice Blase

## 2022-10-12 NOTE — Telephone Encounter (Signed)
Triage team see note below. Recommend decreasing hydralazine to 12.5 mg TID and cutting Imdur to 15 mg daily.

## 2022-10-12 NOTE — Telephone Encounter (Signed)
-----   Message from Vishnu P Mallipeddi sent at 10/12/2022 12:00 PM EDT ----- Regarding: RE: hypotension Patient is currently followed by advanced heart failure team. I am copying them here. ----- Message ----- From: Suann Larry, RN Sent: 10/09/2022  11:26 AM EDT To: Marjo Bicker, MD Subject: hypotension                                    Hey Dr. Jenene Slicker,   Heather [name] is participating in cardiac rehab. Her resting and post exercise blood pressure is low. Her systolic is usually below 90 until we give her water. She does say she feels light headed and "not right in her head" at times. She does not complain of those symptoms with [place] but says she feels this at home occasionally.   Thanks!  Eunice Blase

## 2022-10-13 ENCOUNTER — Encounter (HOSPITAL_COMMUNITY): Payer: Medicare Other

## 2022-10-13 NOTE — Telephone Encounter (Signed)
No answer will attempt later

## 2022-10-16 ENCOUNTER — Encounter: Payer: Self-pay | Admitting: Pulmonary Disease

## 2022-10-16 ENCOUNTER — Ambulatory Visit: Payer: Medicare Other | Admitting: Pulmonary Disease

## 2022-10-16 VITALS — BP 93/52 | HR 74 | Ht 62.5 in | Wt 139.0 lb

## 2022-10-16 DIAGNOSIS — G4734 Idiopathic sleep related nonobstructive alveolar hypoventilation: Secondary | ICD-10-CM

## 2022-10-16 DIAGNOSIS — J449 Chronic obstructive pulmonary disease, unspecified: Secondary | ICD-10-CM

## 2022-10-16 DIAGNOSIS — F5101 Primary insomnia: Secondary | ICD-10-CM

## 2022-10-16 NOTE — Patient Instructions (Signed)
Will arrange for overnight oxygen test at home  Follow up in 6 months

## 2022-10-16 NOTE — Progress Notes (Signed)
[facility]  Chief Complaint  Patient presents with   Sleep Apnea    Not currently using CPAP   COPD    Mixed    Past Surgical History:  She  has a past surgical history that includes Abdominal hysterectomy; Foot surgery (Left); Bunionectomy (Bilateral); Back surgery; Shoulder hemi-arthroplasty (Left, 03/25/2014); Shoulder arthroscopy (Right); Knee surgery; Joint replacement (Bilateral); Joint replacement (Right, 2010 or 2012); RIGHT/LEFT HEART CATH AND CORONARY ANGIOGRAPHY (N/A, 10/27/2021); and BIV PACEMAKER INSERTION CRT-P (N/A, 07/12/2022).  Past Medical History:  Anemia, Anxiety, OA, Depression, GERD, Glaucoma, Gout, HTN, Tremor, Back pain, Rt cerebellar CVA, Obstructive sleep apnea intolerant of CPAP  Constitutional:  BP (!) 93/52   Pulse 74   Ht 5' 2.5" (1.588 m)   Wt 139 lb (63 kg)   SpO2 93%   BMI 25.02 kg/m   Brief Summary:  Heather [name] is a 82 y.o. female former smoker with COPD and insomnia.      Subjective:   Stopped using CPAP.  Couldn't get used to the mask and machine made too much noise.  Has occasional cough.  Trelegy helps.  She is worried about how much this costs, and hoping to get financial assistance.  Had confusional arousals while using ambien and stopped this.  Has been using melatonin herbal tea at night, and this helps.  Physical Exam:   Appearance - well kempt   ENMT - no sinus tenderness, no oral exudate, no LAN, Mallampati 3 airway, no stridor, wear dentures  Respiratory - equal breath sounds bilaterally, no wheezing or rales  CV - s1s2 regular rate and rhythm, no murmurs  Ext - no clubbing, no edema  Skin - no rashes  Psych - normal mood and affect     Pulmonary testing:  PFT 05/25/20 >> FEV1 1.40 (97%), FEV1% 68, TLC 6.52 ( 137%), RV 4.28 (190%), DLCO 58%  Sleep Tests:  ONO with RA 12/30/20 >> test time 7 hrs 29 min.  Baseline SpO2 92%, low SpO2 80%.  Spent 4 min with SpO2 <  88%. HST 04/13/21 >> AHI 5.7, SpO2 low 79%  Cardiac Tests:  Echo 12/14/20 >> EF 50 to 55%, grade 1 DD Bilateral U/S lower legs 02/21/21 >> no DVT Echo 10/07/21 >> EF 15 to 20%, RVSP 49 mmHg, mild/mod MR  Social History:  She  reports that she quit smoking about 37 years ago. Her smoking use included cigarettes. She started smoking about 77 years ago. She has a 40 pack-year smoking history. She has never used smokeless tobacco. She reports that she does not drink alcohol and does not use drugs.  Family History:  Her family history includes Aneurysm in her father; Congestive Heart Failure in her niece; Diabetes in her brother; High blood pressure in her mother; Hypertension in her niece; Pancreatitis in her brother.     Assessment/Plan:   COPD. - continue trelegy 100 one puff daily; samples provided - prn albuterol  Nocturnal hypoxemia. - will arrange for ONO with RA and see if she qualifies for supplemental oxygen at night  Insomnia. Remus Loffler caused confusional arousals - continue melatonin herbal tea at night  Chronic systolic CHF with dilated cardiomyopathy. - followed by Dr. Lennie Odor with cardiology  Time Spent Involved in Patient Care on Day of Examination:  35 minutes  Follow up:   Patient Instructions  Will arrange for overnight oxygen test at home  Follow up in 6 months  Medication List:  Allergies as of 10/16/2022       Reactions   Lovastatin Swelling   Patient states that her tongue swells and she gets a tingling feeling all over. Patient states that her tongue swells and she gets a tingling feeling all over.   Lisinopril Swelling   Tongue swelling        Medication List        Accurate as of October 16, 2022 10:33 AM. If you have any questions, ask your nurse or doctor.          acetaminophen 500 MG tablet Commonly known as: TYLENOL Take 500 mg by mouth every 6 (six) hours as needed for mild pain or moderate pain.   allopurinol 300 MG  tablet Commonly known as: ZYLOPRIM TAKE 1/2 TABLET BY MOUTH ONCE DAILY   aspirin 81 MG chewable tablet Commonly known as: Aspirin Childrens Chew 1 tablet (81 mg total) by mouth daily.   atorvastatin 20 MG tablet Commonly known as: LIPITOR Take 1 tablet (20 mg total) by mouth daily.   bisoprolol 5 MG tablet Commonly known as: ZEBETA Take 2.5 mg by mouth daily.   chlorpheniramine 4 MG tablet Commonly known as: CHLOR-TRIMETON Take 4 mg by mouth daily as needed for allergies.   dapagliflozin propanediol 10 MG Tabs tablet Commonly known as: FARXIGA Take 1 tablet (10 mg total) by mouth daily.   DIALYVITE VITAMIN D 5000 PO Take 5,000 Units by mouth daily.   digoxin 0.125 MG tablet Commonly known as: LANOXIN Take 0.5 tablets (0.0625 mg total) by mouth daily.   famotidine 20 MG tablet Commonly known as: Pepcid Take 1 tablet (20 mg total) by mouth at bedtime. One after supper What changed:  when to take this additional instructions   furosemide 40 MG tablet Commonly known as: LASIX Take 60 mg by mouth daily.   hydrALAZINE 25 MG tablet Commonly known as: APRESOLINE Take 1 tablet (25 mg total) by mouth 3 (three) times daily. What changed:  how much to take additional instructions   HYDROcodone-acetaminophen 5-325 MG tablet Commonly known as: NORCO/VICODIN Take 1 tablet by mouth every 6 (six) hours as needed for moderate pain.   isosorbide mononitrate 30 MG 24 hr tablet Commonly known as: IMDUR Take 1 tablet (30 mg total) by mouth daily. What changed:  how much to take additional instructions   ketoconazole 2 % cream Commonly known as: NIZORAL Apply 1 Application topically as needed for irritation.   losartan 25 MG tablet Commonly known as: COZAAR Take 1 tablet (25 mg total) by mouth daily.   multivitamin with minerals tablet Take 1 tablet by mouth daily.   ProAir HFA 108 (90 Base) MCG/ACT inhaler Generic drug: albuterol Inhale 2 puffs into the lungs every  6 (six) hours as needed for wheezing or shortness of breath.   spironolactone 25 MG tablet Commonly known as: ALDACTONE Take 1 tablet (25 mg total) by mouth daily.   Systane Complete 0.6 % Soln Generic drug: Propylene Glycol Place 1 drop into both eyes 2 (two) times daily.   Trelegy Ellipta 200-62.5-25 MCG/ACT Aepb Generic drug: Fluticasone-Umeclidin-Vilant Inhale 1 puff into the lungs daily.   venlafaxine XR 75 MG 24 hr capsule Commonly known as: EFFEXOR-XR Take 1 capsule (75 mg total) by mouth See admin instructions.        Signature:  Coralyn Helling, MD [facility]Critical [facility] Pager - [phone] 10/16/2022, 10:33 AM

## 2022-10-18 ENCOUNTER — Encounter (HOSPITAL_COMMUNITY)
Admission: RE | Admit: 2022-10-18 | Discharge: 2022-10-18 | Disposition: A | Discharge status: Home / Self Care | Payer: Medicare Other | Source: Ambulatory Visit | Attending: Internal Medicine | Admitting: Internal Medicine

## 2022-10-18 ENCOUNTER — Encounter (HOSPITAL_COMMUNITY): Payer: Medicare Other

## 2022-10-18 ENCOUNTER — Encounter (HOSPITAL_COMMUNITY): Payer: Self-pay | Admitting: *Deleted

## 2022-10-18 DIAGNOSIS — I5022 Chronic systolic (congestive) heart failure: Secondary | ICD-10-CM

## 2022-10-18 DIAGNOSIS — M25551 Pain in right hip: Secondary | ICD-10-CM | POA: Diagnosis not present

## 2022-10-18 MED ORDER — HYDRALAZINE HCL 25 MG PO TABS: 12.5000 mg | ORAL_TABLET | Freq: Three times a day (TID) | ORAL | 3 refills | Status: DC

## 2022-10-18 MED ORDER — ISOSORBIDE MONONITRATE ER 30 MG PO TB24: 15.0000 mg | ORAL_TABLET | Freq: Every day | ORAL | 3 refills | Status: DC

## 2022-10-18 NOTE — Progress Notes (Signed)
Daily Session Note  Patient Details  Name: Heather [name] MRN: [id number] Date of Birth: 1940/04/25 Referring Provider:   Flowsheet Row CARDIAC REHAB PHASE II ORIENTATION from 09/14/2022 in [facility] CARDIAC REHABILITATION  Referring Provider Luane School MD       Encounter Date: 10/18/2022  Check In:  Session Check In - 10/18/22 0910       Check-In   Supervising physician immediately available to respond to emergencies See telemetry face sheet for immediately available ER MD    Location [facility]    Staff Present Fabio Pierce, MA, RCEP, CCRP, Dow Adolph, RN, BSN;Heather Fredric Mare, BS, Exercise Physiologist;Hillary Leonidas Romberg BSN, RN    Virtual Visit No    Medication changes reported     Yes    Comments Cardiologist Decreased Hydralazine from 25 to 12.5 TID and Imdur from 30 to 15 mg daily.    Fall or balance concerns reported    No    Warm-up and Cool-down Performed on first and last piece of equipment    Resistance Training Performed Yes    VAD Patient? No    PAD/SET Patient? No      Pain Assessment   Currently in Pain? No/denies    Pain Score 0-No pain    Multiple Pain Sites No             Capillary Blood Glucose: No results found for this or any previous visit (from the past 24 hour(s)).    Social History   Tobacco Use  Smoking Status Former   Current packs/day: 0.00   Average packs/day: 1 pack/day for 40.0 years (40.0 ttl pk-yrs)   Types: Cigarettes   Start date: 03/20/1945   Quit date: 03/20/1985   Years since quitting: 37.6  Smokeless Tobacco Never    Goals Met:  Independence with exercise equipment Exercise tolerated well No report of concerns or symptoms today Strength training completed today  Goals Unmet:  Not Applicable  Comments: Pt able to follow exercise prescription today without complaint.  Will continue to monitor for progression.    Dr. Dina Rich is Medical Director for [facility]

## 2022-10-18 NOTE — Telephone Encounter (Signed)
Pt aware and voiced understanding 

## 2022-10-18 NOTE — Progress Notes (Signed)
Cardiac Individual Treatment Plan  Patient Details  Name: Heather [name] MRN: [id number] Date of Birth: 07/07/1940 Referring Provider:   Flowsheet Row CARDIAC REHAB PHASE II ORIENTATION from 09/14/2022 in [facility] CARDIAC REHABILITATION  Referring Provider Luane School MD       Initial Encounter Date:  Flowsheet Row CARDIAC REHAB PHASE II ORIENTATION from 09/14/2022 in [facility] CARDIAC REHABILITATION  Date 09/14/22       Visit Diagnosis: Heart failure, chronic systolic (HCC)  Patient's Home Medications on Admission:  Current Outpatient Medications:    acetaminophen (TYLENOL) 500 MG tablet, Take 500 mg by mouth every 6 (six) hours as needed for mild pain or moderate pain., Disp: , Rfl:    allopurinol (ZYLOPRIM) 300 MG tablet, TAKE 1/2 TABLET BY MOUTH ONCE DAILY, Disp: 45 tablet, Rfl: 0   aspirin (ASPIRIN CHILDRENS) 81 MG chewable tablet, Chew 1 tablet (81 mg total) by mouth daily., Disp: 90 tablet, Rfl: 3   atorvastatin (LIPITOR) 20 MG tablet, Take 1 tablet (20 mg total) by mouth daily., Disp: 90 tablet, Rfl: 3   bisoprolol (ZEBETA) 5 MG tablet, Take 2.5 mg by mouth daily., Disp: , Rfl:    chlorpheniramine (CHLOR-TRIMETON) 4 MG tablet, Take 4 mg by mouth daily as needed for allergies., Disp: , Rfl:    Cholecalciferol (DIALYVITE VITAMIN D 5000 PO), Take 5,000 Units by mouth daily., Disp: , Rfl:    dapagliflozin propanediol (FARXIGA) 10 MG TABS tablet, Take 1 tablet (10 mg total) by mouth daily., Disp: 90 tablet, Rfl: 3   digoxin (LANOXIN) 0.125 MG tablet, Take 0.5 tablets (0.0625 mg total) by mouth daily., Disp: 90 tablet, Rfl: 3   famotidine (PEPCID) 20 MG tablet, Take 1 tablet (20 mg total) by mouth at bedtime. One after supper (Patient taking differently: Take 20 mg by mouth 2 (two) times daily.), Disp: 30 tablet, Rfl: 3   Fluticasone-Umeclidin-Vilant (TRELEGY ELLIPTA) 200-62.5-25 MCG/ACT AEPB, Inhale 1 puff into the lungs daily., Disp: 1 each, Rfl: 0   furosemide (LASIX)  40 MG tablet, Take 60 mg by mouth daily., Disp: , Rfl:    hydrALAZINE (APRESOLINE) 25 MG tablet, Take 1 tablet (25 mg total) by mouth 3 (three) times daily. (Patient taking differently: Take 12.5 mg by mouth 3 (three) times daily. Changed by Brynda Peon, NP 10/12/22.), Disp: 180 tablet, Rfl: 3   HYDROcodone-acetaminophen (NORCO/VICODIN) 5-325 MG tablet, Take 1 tablet by mouth every 6 (six) hours as needed for moderate pain., Disp: 120 tablet, Rfl: 0   isosorbide mononitrate (IMDUR) 30 MG 24 hr tablet, Take 1 tablet (30 mg total) by mouth daily. (Patient taking differently: Take 15 mg by mouth daily. Changed by Brynda Peon, NP 10/12/22 due to low blood pressure in cardiac rehab.), Disp: 90 tablet, Rfl: 3   ketoconazole (NIZORAL) 2 % cream, Apply 1 Application topically as needed for irritation., Disp: , Rfl:    losartan (COZAAR) 25 MG tablet, Take 1 tablet (25 mg total) by mouth daily., Disp: 90 tablet, Rfl: 3   Multiple Vitamins-Minerals (MULTIVITAMIN WITH MINERALS) tablet, Take 1 tablet by mouth daily., Disp: , Rfl:    PROAIR HFA 108 (90 Base) MCG/ACT inhaler, Inhale 2 puffs into the lungs every 6 (six) hours as needed for wheezing or shortness of breath., Disp: 18 g, Rfl: 2   Propylene Glycol (SYSTANE COMPLETE) 0.6 % SOLN, Place 1 drop into both eyes 2 (two) times daily., Disp: , Rfl:    spironolactone (ALDACTONE) 25 MG tablet, Take 1 tablet (25 mg total)  by mouth daily., Disp: 30 tablet, Rfl: 5   venlafaxine XR (EFFEXOR-XR) 75 MG 24 hr capsule, Take 1 capsule (75 mg total) by mouth See admin instructions., Disp: 30 capsule, Rfl: 5  Past Medical History: Past Medical History:  Diagnosis Date   Anemia    Anxiety    Arthritis    COPD (chronic obstructive pulmonary disease) (HCC)    CVA (cerebral vascular accident) (HCC)    Depression    Dry eyes 04/14/2014   GERD (gastroesophageal reflux disease)    Glaucoma    Gout    HTN (hypertension)    OSA (obstructive sleep apnea)     Tobacco Use: Social  History   Tobacco Use  Smoking Status Former   Current packs/day: 0.00   Average packs/day: 1 pack/day for 40.0 years (40.0 ttl pk-yrs)   Types: Cigarettes   Start date: 03/20/1945   Quit date: 03/20/1985   Years since quitting: 37.6  Smokeless Tobacco Never    Labs: Review Flowsheet  More data exists      Latest Ref Rng & Units 03/18/2022 03/19/2022 03/20/2022 05/26/2022 08/17/2022  Labs for ITP Cardiac and Pulmonary Rehab  Cholestrol 0 - 200 mg/dL - - - 540  981   LDL (calc) 0 - 99 mg/dL - - - 82  81   HDL-C >19 mg/dL - - - 65  82   Trlycerides <150 mg/dL - - - 58  45   Hemoglobin A1c 4.8 - 5.6 % - - - 6.5  -  O2 Saturation % 78.3  72.2  59.1  - -    Details            Capillary Blood Glucose: Lab Results  Component Value Date   GLUCAP 73 09/29/2022   GLUCAP 129 (H) 09/29/2022   GLUCAP 79 09/25/2022   GLUCAP 100 (H) 09/25/2022   GLUCAP 83 09/18/2022     Exercise Target Goals: Exercise Program Goal: Individual exercise prescription set using results from initial 6 min walk test and THRR while considering  patient's activity barriers and safety.   Exercise Prescription Goal: Starting with aerobic activity 30 plus minutes a day, 3 days per week for initial exercise prescription. Provide home exercise prescription and guidelines that participant acknowledges understanding prior to discharge.  Activity Barriers & Risk Stratification:  Activity Barriers & Cardiac Risk Stratification - 09/14/22 1236       Activity Barriers & Cardiac Risk Stratification   Activity Barriers Left Hip Replacement;Right Hip Replacement;Right Knee Replacement;Joint Problems;Assistive Device;History of Falls;Balance Concerns   L shoulder replacement   Cardiac Risk Stratification High             6 Minute Walk:  6 Minute Walk     Row Name 09/14/22 1402         6 Minute Walk   Phase Initial     Distance 828 feet     Walk Time 6 minutes     # of Rest Breaks 0     MPH 1.57      METS 1.54     RPE 11     VO2 Peak 5.3     Symptoms Yes (comment)     Comments R hip pain 5/10     Resting HR 63 bpm     Resting BP 122/62     Resting Oxygen Saturation  99 %     Exercise Oxygen Saturation  during 6 min walk 96 %     Max Ex. HR  89 bpm     Max Ex. BP 134/72     2 Minute Post BP 124/70              Oxygen Initial Assessment:   Oxygen Re-Evaluation:   Oxygen Discharge (Final Oxygen Re-Evaluation):   Initial Exercise Prescription:  Initial Exercise Prescription - 09/14/22 1400       Date of Initial Exercise RX and Referring Provider   Date 09/14/22    Referring Provider Luane School MD      Oxygen   Maintain Oxygen Saturation 88% or higher      Treadmill   MPH 1.3    Grade 0.5    Minutes 15    METs 2.08      NuStep   Level 1    SPM 80    Minutes 15    METs 2      Prescription Details   Frequency (times per week) 3    Duration Progress to 30 minutes of continuous aerobic without signs/symptoms of physical distress      Intensity   THRR 40-80% of Max Heartrate 94-124    Ratings of Perceived Exertion 11-13    Perceived Dyspnea 0-4      Progression   Progression Continue to progress workloads to maintain intensity without signs/symptoms of physical distress.      Resistance Training   Training Prescription Yes    Weight 2 lb    Reps 10-15             Perform Capillary Blood Glucose checks as needed.  Exercise Prescription Changes:   Exercise Prescription Changes     Row Name 09/14/22 1400 09/18/22 1300 10/02/22 1200         Response to Exercise   Blood Pressure (Admit) 122/62 124/62 100/58     Blood Pressure (Exercise) 134/72 120/64 --     Blood Pressure (Exit) 124/70 98/50 90/40      Heart Rate (Admit) 653 bpm 71 bpm 72 bpm     Heart Rate (Exercise) 89 bpm 90 bpm 93 bpm     Heart Rate (Exit) 69 bpm 68 bpm 75 bpm     Oxygen Saturation (Admit) 99 % -- --     Oxygen Saturation (Exercise) 96 % -- --     Rating of  Perceived Exertion (Exercise) 11 13 13      Symptoms R hip pain 5/10 -- --     Comments walk test results -- --     Duration -- Continue with 45 min of aerobic exercise without signs/symptoms of physical distress. Continue with 30 min of aerobic exercise without signs/symptoms of physical distress.     Intensity -- THRR unchanged THRR unchanged       Progression   Progression -- Continue to progress workloads to maintain intensity without signs/symptoms of physical distress. Continue to progress workloads to maintain intensity without signs/symptoms of physical distress.       Resistance Training   Training Prescription -- Yes Yes     Weight -- 2 3     Reps -- 10-15 10-15       Treadmill   MPH -- 1 1.3     Grade -- 0 0     Minutes -- 15 15     METs -- 1.77 1.99       NuStep   Level -- 1 2     SPM -- 55 67     Minutes -- 15 15  METs -- 1.6 1.7       Oxygen   Maintain Oxygen Saturation -- 88% or higher 88% or higher              Exercise Comments:   Exercise Comments     Row Name 09/18/22 1000           Exercise Comments First full day of exercise!  Patient was oriented to gym and equipment including functions, settings, policies, and procedures.  Patient's individual exercise prescription and treatment plan were reviewed.  All starting workloads were established based on the results of the 6 minute walk test done at initial orientation visit.  The plan for exercise progression was also introduced and progression will be customized based on patient's performance and goals.                Exercise Goals and Review:   Exercise Goals     Row Name 09/14/22 1405 09/18/22 1323           Exercise Goals   Increase Physical Activity Yes Yes      Intervention Provide advice, education, support and counseling about physical activity/exercise needs.;Develop an individualized exercise prescription for aerobic and resistive training based on initial evaluation  findings, risk stratification, comorbidities and participant's personal goals. Provide advice, education, support and counseling about physical activity/exercise needs.;Develop an individualized exercise prescription for aerobic and resistive training based on initial evaluation findings, risk stratification, comorbidities and participant's personal goals.      Expected Outcomes Short Term: Attend rehab on a regular basis to increase amount of physical activity.;Long Term: Exercising regularly at least 3-5 days a week.;Long Term: Add in home exercise to make exercise part of routine and to increase amount of physical activity. Short Term: Attend rehab on a regular basis to increase amount of physical activity.;Long Term: Exercising regularly at least 3-5 days a week.;Long Term: Add in home exercise to make exercise part of routine and to increase amount of physical activity.      Increase Strength and Stamina Yes Yes      Intervention Provide advice, education, support and counseling about physical activity/exercise needs.;Develop an individualized exercise prescription for aerobic and resistive training based on initial evaluation findings, risk stratification, comorbidities and participant's personal goals. Provide advice, education, support and counseling about physical activity/exercise needs.;Develop an individualized exercise prescription for aerobic and resistive training based on initial evaluation findings, risk stratification, comorbidities and participant's personal goals.      Expected Outcomes Short Term: Increase workloads from initial exercise prescription for resistance, speed, and METs.;Short Term: Perform resistance training exercises routinely during rehab and add in resistance training at home;Long Term: Improve cardiorespiratory fitness, muscular endurance and strength as measured by increased METs and functional capacity ( ) Short Term: Increase workloads from initial exercise  prescription for resistance, speed, and METs.;Short Term: Perform resistance training exercises routinely during rehab and add in resistance training at home;Long Term: Improve cardiorespiratory fitness, muscular endurance and strength as measured by increased METs and functional capacity ( )      Able to understand and use rate of perceived exertion (RPE) scale Yes Yes      Intervention Provide education and explanation on how to use RPE scale Provide education and explanation on how to use RPE scale      Expected Outcomes Short Term: Able to use RPE daily in rehab to express subjective intensity level;Long Term:  Able to use RPE to guide intensity level when exercising independently Short Term: Able  to use RPE daily in rehab to express subjective intensity level;Long Term:  Able to use RPE to guide intensity level when exercising independently      Able to understand and use Dyspnea scale Yes Yes      Intervention Provide education and explanation on how to use Dyspnea scale Provide education and explanation on how to use Dyspnea scale      Expected Outcomes Short Term: Able to use Dyspnea scale daily in rehab to express subjective sense of shortness of breath during exertion;Long Term: Able to use Dyspnea scale to guide intensity level when exercising independently Short Term: Able to use Dyspnea scale daily in rehab to express subjective sense of shortness of breath during exertion;Long Term: Able to use Dyspnea scale to guide intensity level when exercising independently      Knowledge and understanding of Target Heart Rate Range (THRR) Yes Yes      Intervention Provide education and explanation of THRR including how the numbers were predicted and where they are located for reference Provide education and explanation of THRR including how the numbers were predicted and where they are located for reference      Expected Outcomes Short Term: Able to state/look up THRR;Short Term: Able to use daily as  guideline for intensity in rehab;Long Term: Able to use THRR to govern intensity when exercising independently Short Term: Able to state/look up THRR;Short Term: Able to use daily as guideline for intensity in rehab;Long Term: Able to use THRR to govern intensity when exercising independently      Able to check pulse independently Yes Yes      Intervention Provide education and demonstration on how to check pulse in carotid and radial arteries.;Review the importance of being able to check your own pulse for safety during independent exercise Provide education and demonstration on how to check pulse in carotid and radial arteries.;Review the importance of being able to check your own pulse for safety during independent exercise      Expected Outcomes Long Term: Able to check pulse independently and accurately;Short Term: Able to explain why pulse checking is important during independent exercise Long Term: Able to check pulse independently and accurately;Short Term: Able to explain why pulse checking is important during independent exercise      Understanding of Exercise Prescription Yes Yes      Intervention Provide education, explanation, and written materials on patient's individual exercise prescription Provide education, explanation, and written materials on patient's individual exercise prescription      Expected Outcomes Short Term: Able to explain program exercise prescription;Long Term: Able to explain home exercise prescription to exercise independently Short Term: Able to explain program exercise prescription;Long Term: Able to explain home exercise prescription to exercise independently               Exercise Goals Re-Evaluation :  Exercise Goals Re-Evaluation     Row Name 09/14/22 1405 09/18/22 1323 10/03/22 0814 10/03/22 0815 10/04/22 0936     Exercise Goal Re-Evaluation   Exercise Goals Review Understanding of Exercise Prescription;Able to understand and use Dyspnea scale;Able to  understand and use rate of perceived exertion (RPE) scale Increase Physical Activity;Increase Strength and Stamina;Able to understand and use rate of perceived exertion (RPE) scale;Knowledge and understanding of Target Heart Rate Range (THRR);Able to check pulse independently;Understanding of Exercise Prescription -- -- Increase Physical Activity;Increase Strength and Stamina;Understanding of Exercise Prescription   Comments Reviewed RPE  and dyspnea scale and program prescription with pt today.  Pt  voiced understanding and was given a copy of goals to take home. Pt has completed her first day in cardiac rehab. She did well for her first day in class learing the equipment. She is slightly deconditioned and started out slower on the treadmill and stepper. Informend the pt about achieving 80 SPM on the stepper for her first week. She is currently exercising at 1.77 METs on the treadmill. Will continue to monitor and progress as able, Pt is taking the first Pt is on 6th visit and has increased her level on the stepper and speed on the treadmill Pt feels like she has gotten a little stronger since starting rehab.  She is currently on 1.4 speed on the treadmill.  The pt has started using her exercise bike at home.  She exercised for 10 min on her bike yesterday, and she stated that she plans to increase this to a few days a week for 15 min each time.   Expected Outcomes Short: Use RPE daily to regulate intensity.  Long: Follow program prescription Through exercise at rehab and home, patient wll achieve their goals, -- -- Short term:  To go over home exercise goals with pt  Long term:  Pt will continue exercising at home a few days a week             Discharge Exercise Prescription (Final Exercise Prescription Changes):  Exercise Prescription Changes - 10/02/22 1200       Response to Exercise   Blood Pressure (Admit) 100/58    Blood Pressure (Exit) 90/40    Heart Rate (Admit) 72 bpm    Heart Rate  (Exercise) 93 bpm    Heart Rate (Exit) 75 bpm    Rating of Perceived Exertion (Exercise) 13    Duration Continue with 30 min of aerobic exercise without signs/symptoms of physical distress.    Intensity THRR unchanged      Progression   Progression Continue to progress workloads to maintain intensity without signs/symptoms of physical distress.      Resistance Training   Training Prescription Yes    Weight 3    Reps 10-15      Treadmill   MPH 1.3    Grade 0    Minutes 15    METs 1.99      NuStep   Level 2    SPM 67    Minutes 15    METs 1.7      Oxygen   Maintain Oxygen Saturation 88% or higher             Nutrition:  Target Goals: Understanding of nutrition guidelines, daily intake of sodium 1500mg , cholesterol 200mg , calories 30% from fat and 7% or less from saturated fats, daily to have 5 or more servings of fruits and vegetables.  Biometrics:  Pre Biometrics - 09/14/22 1406       Pre Biometrics   Height 5' 2.5" (1.588 m)    Weight 135 lb 14.4 oz (61.6 kg)    Waist Circumference 28 inches    Hip Circumference 34 inches    Waist to Hip Ratio 0.82 %    BMI (Calculated) 24.44    Triceps Skinfold 14 mm    % Body Fat 32.2 %    Grip Strength 17.1 kg    Single Leg Stand 5 seconds              Nutrition Therapy Plan and Nutrition Goals:  Nutrition Therapy & Goals - 09/14/22 1412  Intervention Plan   Intervention Prescribe, educate and counsel regarding individualized specific dietary modifications aiming towards targeted core components such as weight, hypertension, lipid management, diabetes, heart failure and other comorbidities.;Nutrition handout(s) given to patient.    Expected Outcomes Short Term Goal: Understand basic principles of dietary content, such as calories, fat, sodium, cholesterol and nutrients.             Nutrition Assessments:  Nutrition Assessments - 09/14/22 1412       MEDFICTS Scores   Pre Score 49             MEDIFICTS Score Key: ?70 Need to make dietary changes  40-70 Heart Healthy Diet ? 40 Therapeutic Level Cholesterol Diet   Picture Your Plate Scores: <16 Unhealthy dietary pattern with much room for improvement. 41-50 Dietary pattern unlikely to meet recommendations for good health and room for improvement. 51-60 More healthful dietary pattern, with some room for improvement.  >60 Healthy dietary pattern, although there may be some specific behaviors that could be improved.    Nutrition Goals Re-Evaluation:  Nutrition Goals Re-Evaluation     Row Name 10/04/22 0948             Goals   Current Weight 138 lb 14.4 oz (63 kg)       Nutrition Goal Heart healthy diet       Comment Pt tries to stay away from salty foods; however, she likes sweets and loves to bake.  She states that she has gained a few pounds and knows that she has to change her diet.       Expected Outcome Short term:  Pt will cut back on her sugar intake  Long term:  Focus on heart healthy eating                Nutrition Goals Discharge (Final Nutrition Goals Re-Evaluation):  Nutrition Goals Re-Evaluation - 10/04/22 0948       Goals   Current Weight 138 lb 14.4 oz (63 kg)    Nutrition Goal Heart healthy diet    Comment Pt tries to stay away from salty foods; however, she likes sweets and loves to bake.  She states that she has gained a few pounds and knows that she has to change her diet.    Expected Outcome Short term:  Pt will cut back on her sugar intake  Long term:  Focus on heart healthy eating             Psychosocial: Target Goals: Acknowledge presence or absence of significant depression and/or stress, maximize coping skills, provide positive support system. Participant is able to verbalize types and ability to use techniques and skills needed for reducing stress and depression.  Initial Review & Psychosocial Screening:  Initial Psych Review & Screening - 09/14/22 1239       Initial  Review   Current issues with Current Psychotropic Meds;History of Depression;Current Stress Concerns    Source of Stress Concerns Financial;Unable to participate in former interests or hobbies;Unable to perform yard/household activities    Comments letter from IRS about 2023 tax return not being filed, she is taking effexor for depression and feeling well managed currently, sleeps well uses Sleepy Time Tea, heart failure has slowed her down wants to get back to her normal      Family Dynamics   Good Support System? Yes   has a friend she relies on, 3 children (2 sons in [place], daughter in [place]) they call often and  sons come to check in     Barriers   Psychosocial barriers to participate in program Psychosocial barriers identified (see note);The patient should benefit from training in stress management and relaxation.      Screening Interventions   Interventions Encouraged to exercise;To provide support and resources with identified psychosocial needs;Provide feedback about the scores to participant    Expected Outcomes Short Term goal: Utilizing psychosocial counselor, staff and physician to assist with identification of specific Stressors or current issues interfering with healing process. Setting desired goal for each stressor or current issue identified.;Long Term Goal: Stressors or current issues are controlled or eliminated.;Short Term goal: Identification and review with participant of any Quality of Life or Depression concerns found by scoring the questionnaire.;Long Term goal: The participant improves quality of Life and PHQ9 Scores as seen by post scores and/or verbalization of changes             Quality of Life Scores:  Quality of Life - 09/14/22 1411       Quality of Life   Select Quality of Life      Quality of Life Scores   Health/Function Pre 25 %    Socioeconomic Pre 24.08 %    Psych/Spiritual Pre 28 %    Family Pre 20.75 %    GLOBAL Pre 24.88 %            Scores  of 19 and below usually indicate a poorer quality of life in these areas.  A difference of  2-3 points is a clinically meaningful difference.  A difference of 2-3 points in the total score of the Quality of Life Index has been associated with significant improvement in overall quality of life, self-image, physical symptoms, and general health in studies assessing change in quality of life.  PHQ-9: Review Flowsheet  More data exists      09/14/2022 08/31/2022 05/26/2022 04/03/2022 02/02/2022  Depression screen PHQ 2/9  Decreased Interest 0 0 0 0 1 0  Down, Depressed, Hopeless 0 0 0 0 0 0  PHQ - 2 Score 0 0 0 0 1 0  Altered sleeping 0 - 2 2 0  Tired, decreased energy 1 - 0 1 2  Change in appetite 1 - 2 1 1   Feeling bad or failure about yourself  0 - 0 1 0  Trouble concentrating 0 - 0 0 0  Moving slowly or fidgety/restless 0 - 1 1 0  Suicidal thoughts 0 - 0 0 0  PHQ-9 Score 2 - 5 7 3   Difficult doing work/chores Not difficult at all - - Not difficult at all -    Details       Multiple values from one day are sorted in reverse-chronological order        Interpretation of Total Score  Total Score Depression Severity:  1-4 = Minimal depression, 5-9 = Mild depression, 10-14 = Moderate depression, 15-19 = Moderately severe depression, 20-27 = Severe depression   Psychosocial Evaluation and Intervention:  Psychosocial Evaluation - 09/14/22 1407       Psychosocial Evaluation & Interventions   Interventions Encouraged to exercise with the program and follow exercise prescription    Comments Khalee is coming into cardiac rehab for heart failure.  She has had two hospitalizations from heart failure over the past year.  She has not been able to go and do and exercise like she used to and would like to rebuild her strength and stamina.  She wants to get back  to her normal again.  She lives alone, but has a "friend" and they check on each other frequently.  She has two sons in [place] that call  regularly and come by to check in from time to time.  She also has a daughter in [place] that calls at least weekly.  She usually sleeps well using her Sleepy time tea at night.  Her biggest stressor other than her health is trying to figure her finances as she did not file taxes for 2023 thinking she didn't need to and got a letter telling her otherwise.  Overall she is a positive and happy person.  She does have a history of depression but PHQ showed no current issues. She take effexor for her depression and is feeling well managed at this time.  She is excited to start program and does not preceive any  barriers to attending rehab.    Expected Outcomes Short: Attend rehab to build stamina Long: Be able to go and do as she wants to do again    Continue Psychosocial Services  Follow up required by staff             Psychosocial Re-Evaluation:  Psychosocial Re-Evaluation     Row Name 10/04/22 0940             Psychosocial Re-Evaluation   Current issues with Current Stress Concerns;Current Psychotropic Meds       Comments Pt states her depression is being well managed with Effexor.  She has tried Sleepy Time Tea to help her sleep; however, she went to [employer] and purchased Melatonin which is working better for her.  Pt states she has no stressors at home at this time.       Expected Outcomes Short term:  Pt's depression will continue to be managed with Effexor Long term:  Pt will have effective stress management       Interventions Encouraged to attend Cardiac Rehabilitation for the exercise;Stress management education;Relaxation education       Continue Psychosocial Services  Follow up required by staff         Initial Review   Source of Stress Concerns Financial;Unable to participate in former interests or hobbies;Unable to perform yard/household activities                Psychosocial Discharge (Final Psychosocial Re-Evaluation):  Psychosocial Re-Evaluation - 10/04/22 0940        Psychosocial Re-Evaluation   Current issues with Current Stress Concerns;Current Psychotropic Meds    Comments Pt states her depression is being well managed with Effexor.  She has tried Sleepy Time Tea to help her sleep; however, she went to [employer] and purchased Melatonin which is working better for her.  Pt states she has no stressors at home at this time.    Expected Outcomes Short term:  Pt's depression will continue to be managed with Effexor Long term:  Pt will have effective stress management    Interventions Encouraged to attend Cardiac Rehabilitation for the exercise;Stress management education;Relaxation education    Continue Psychosocial Services  Follow up required by staff      Initial Review   Source of Stress Concerns Financial;Unable to participate in former interests or hobbies;Unable to perform yard/household activities             Vocational Rehabilitation: Provide vocational rehab assistance to qualifying candidates.   Vocational Rehab Evaluation & Intervention:  Vocational Rehab - 09/14/22 1235       Initial Vocational Rehab Evaluation &  Intervention   Assessment shows need for Vocational Rehabilitation No   retired            Education: Education Goals: Education classes will be provided on a weekly basis, covering required topics. Participant will state understanding/return demonstration of topics presented.  Learning Barriers/Preferences:  Learning Barriers/Preferences - 09/14/22 1234       Learning Barriers/Preferences   Learning Barriers Sight   glasses for reading   Learning Preferences Skilled Demonstration;Written Material             Education Topics: Hypertension, Hypertension Reduction -Define heart disease and high blood pressure. Discus how high blood pressure affects the body and ways to reduce high blood pressure.   Exercise and Your Heart -Discuss why it is important to exercise, the FITT principles of exercise,  normal and abnormal responses to exercise, and how to exercise safely.   Angina -Discuss definition of angina, causes of angina, treatment of angina, and how to decrease risk of having angina. Flowsheet Row CARDIAC REHAB PHASE II EXERCISE from 10/11/2022 in [place] CARDIAC REHABILITATION  Date 09/20/22  Educator [facility]  Instruction Review Code 1- Verbalizes Understanding       Cardiac Medications -Review what the following cardiac medications are used for, how they affect the body, and side effects that may occur when taking the medications.  Medications include Aspirin, Beta blockers, calcium channel blockers, ACE Inhibitors, angiotensin receptor blockers, diuretics, digoxin, and antihyperlipidemics.   Congestive Heart Failure -Discuss the definition of CHF, how to live with CHF, the signs and symptoms of CHF, and how keep track of weight and sodium intake.   Heart Disease and Intimacy -Discus the effect sexual activity has on the heart, how changes occur during intimacy as we age, and safety during sexual activity. Flowsheet Row CARDIAC REHAB PHASE II EXERCISE from 10/11/2022 in [place] CARDIAC REHABILITATION  Date 10/11/22  Educator [facility]  Instruction Review Code 1- Verbalizes Understanding       Smoking Cessation / COPD -Discuss different methods to quit smoking, the health benefits of quitting smoking, and the definition of COPD.   Nutrition I: Fats -Discuss the types of cholesterol, what cholesterol does to the heart, and how cholesterol levels can be controlled.   Nutrition II: Labels -Discuss the different components of food labels and how to read food label   Heart Parts/Heart Disease and PAD -Discuss the anatomy of the heart, the pathway of blood circulation through the heart, and these are affected by heart disease.   Stress I: Signs and Symptoms -Discuss the causes of stress, how stress may lead to anxiety and depression, and ways to limit stress.   Stress  II: Relaxation -Discuss different types of relaxation techniques to limit stress.   Warning Signs of Stroke / TIA -Discuss definition of a stroke, what the signs and symptoms are of a stroke, and how to identify when someone is having stroke.   Knowledge Questionnaire Score:  Knowledge Questionnaire Score - 09/14/22 1412       Knowledge Questionnaire Score   Pre Score 19/24             Core Components/Risk Factors/Patient Goals at Admission:  Personal Goals and Risk Factors at Admission - 09/14/22 1412       Core Components/Risk Factors/Patient Goals on Admission    Weight Management Yes;Weight Maintenance    Intervention Weight Management: Develop a combined nutrition and exercise program designed to reach desired caloric intake, while maintaining appropriate intake of nutrient and  fiber, sodium and fats, and appropriate energy expenditure required for the weight goal.;Weight Management: Provide education and appropriate resources to help participant work on and attain dietary goals.    Admit Weight 135 lb 14.4 oz (61.6 kg)    Goal Weight: Short Term 135 lb (61.2 kg)    Goal Weight: Long Term 135 lb (61.2 kg)    Expected Outcomes Short Term: Continue to assess and modify interventions until short term weight is achieved;Long Term: Adherence to nutrition and physical activity/exercise program aimed toward attainment of established weight goal;Weight Maintenance: Understanding of the daily nutrition guidelines, which includes 25-35% calories from fat, 7% or less cal from saturated fats, less than 200mg  cholesterol, less than 1.5gm of sodium, & 5 or more servings of fruits and vegetables daily    Diabetes Yes    Intervention Provide education about signs/symptoms and action to take for hypo/hyperglycemia.;Provide education about proper nutrition, including hydration, and aerobic/resistive exercise prescription along with prescribed medications to achieve blood glucose in normal ranges:  Fasting glucose 65-99 mg/dL    Expected Outcomes Short Term: Participant verbalizes understanding of the signs/symptoms and immediate care of hyper/hypoglycemia, proper foot care and importance of medication, aerobic/resistive exercise and nutrition plan for blood glucose control.;Long Term: Attainment of HbA1C < 7%.    Heart Failure Yes    Intervention Provide a combined exercise and nutrition program that is supplemented with education, support and counseling about heart failure. Directed toward relieving symptoms such as shortness of breath, decreased exercise tolerance, and extremity edema.    Expected Outcomes Improve functional capacity of life;Short term: Attendance in program 2-3 days a week with increased exercise capacity. Reported lower sodium intake. Reported increased fruit and vegetable intake. Reports medication compliance.;Short term: Daily weights obtained and reported for increase. Utilizing diuretic protocols set by physician.;Long term: Adoption of self-care skills and reduction of barriers for early signs and symptoms recognition and intervention leading to self-care maintenance.    Hypertension Yes    Intervention Provide education on lifestyle modifcations including regular physical activity/exercise, weight management, moderate sodium restriction and increased consumption of fresh fruit, vegetables, and low fat dairy, alcohol moderation, and smoking cessation.;Monitor prescription use compliance.    Expected Outcomes Short Term: Continued assessment and intervention until BP is < 140/52mm HG in hypertensive participants. < 130/38mm HG in hypertensive participants with diabetes, heart failure or chronic kidney disease.;Long Term: Maintenance of blood pressure at goal levels.    Lipids Yes    Intervention Provide education and support for participant on nutrition & aerobic/resistive exercise along with prescribed medications to achieve LDL 70mg , HDL >40mg .    Expected Outcomes Short  Term: Participant states understanding of desired cholesterol values and is compliant with medications prescribed. Participant is following exercise prescription and nutrition guidelines.;Long Term: Cholesterol controlled with medications as prescribed, with individualized exercise RX and with personalized nutrition plan. Value goals: LDL < 70mg , HDL > 40 mg.             Core Components/Risk Factors/Patient Goals Review:   Goals and Risk Factor Review     Row Name 10/04/22 0956             Core Components/Risk Factors/Patient Goals Review   Personal Goals Review Weight Management/Obesity;Heart Failure;Lipids;Diabetes;Hypertension       Review Pt states that she does not check her blood sugar at home and does not have a meter.  I spoke to the pt about asking her primary doctor for a prescription for a freestyle meter  so that she can check her blood sugar daily.  She seemed very willing to ask her doctor about this so that she can monitor her blood sugar.  She does check her BP 2-3 times a week and weighs daily.       Expected Outcomes Short term:  Pt will have asked her doctor for a prescription for a freestyle meter  Long term:  Pt will be more proactive with her diabetes                Core Components/Risk Factors/Patient Goals at Discharge (Final Review):   Goals and Risk Factor Review - 10/04/22 0956       Core Components/Risk Factors/Patient Goals Review   Personal Goals Review Weight Management/Obesity;Heart Failure;Lipids;Diabetes;Hypertension    Review Pt states that she does not check her blood sugar at home and does not have a meter.  I spoke to the pt about asking her primary doctor for a prescription for a freestyle meter so that she can check her blood sugar daily.  She seemed very willing to ask her doctor about this so that she can monitor her blood sugar.  She does check her BP 2-3 times a week and weighs daily.    Expected Outcomes Short term:  Pt will have asked  her doctor for a prescription for a freestyle meter  Long term:  Pt will be more proactive with her diabetes             ITP Comments:  ITP Comments     Row Name 09/14/22 1401 09/18/22 1000 09/20/22 0843 10/09/22 1127 10/13/22 0720   ITP Comments Patient attend orientation today.  Patient is attendingCardiac Rehabilitation Program.  Documentation for diagnosis can be found in [facility] encounter 08/18/22.  Reviewed medical chart, RPE, gym safety, and program guidelines.  Patient was fitted to equipment they will be using during rehab.  Patient is scheduled to start exercise on 09/18/22. First full day of exercise!  Patient was oriented to gym and equipment including functions, settings, policies, and procedures.  Patient's individual exercise prescription and treatment plan were reviewed.  All starting workloads were established based on the results of the 6 minute walk test done at initial orientation visit.  The plan for exercise progression was also introduced and progression will be customized based on patient's performance and goals. 30 day review completed. ITP sent to Dr. Dina Rich, Medical Director of Cardiac Rehab. Continue with ITP unless changes are made by physician.  New to program. Sent Dr Corinna Lines a note regarding low blood pressure with complaints of dizziness and not feeling right in her head at home occasionally. Dr. Jenene Slicker forwarded note to HF clinic. Brynda Peon, NP decreased her Hydralazine from 25 mg to 12.5 mg TID and decreased her Imdur from 30 mg to 15 mg daily. We will continue to montior her blood pressure and symptoms.    Row Name 10/18/22 0804           ITP Comments 30 day review completed. ITP sent to Dr. Dina Rich, Medical Director of Cardiac Rehab. Continue with ITP unless changes are made by physician.                Comments: 30 day review

## 2022-10-19 ENCOUNTER — Ambulatory Visit: Payer: Medicare Other | Admitting: Internal Medicine

## 2022-10-19 ENCOUNTER — Encounter: Payer: Medicare Other | Attending: Physical Medicine & Rehabilitation | Admitting: Physical Medicine & Rehabilitation

## 2022-10-19 ENCOUNTER — Encounter: Payer: Medicare Other | Admitting: Internal Medicine

## 2022-10-19 ENCOUNTER — Encounter: Payer: Self-pay | Admitting: Physical Medicine & Rehabilitation

## 2022-10-19 VITALS — BP 109/68 | HR 68 | Ht 62.5 in | Wt 136.0 lb

## 2022-10-19 DIAGNOSIS — F32A Depression, unspecified: Secondary | ICD-10-CM | POA: Diagnosis not present

## 2022-10-19 DIAGNOSIS — Z79891 Long term (current) use of opiate analgesic: Secondary | ICD-10-CM | POA: Insufficient documentation

## 2022-10-19 DIAGNOSIS — G8929 Other chronic pain: Secondary | ICD-10-CM | POA: Diagnosis not present

## 2022-10-19 DIAGNOSIS — Z5181 Encounter for therapeutic drug level monitoring: Secondary | ICD-10-CM | POA: Diagnosis not present

## 2022-10-19 DIAGNOSIS — M545 Low back pain, unspecified: Secondary | ICD-10-CM | POA: Insufficient documentation

## 2022-10-19 DIAGNOSIS — M25551 Pain in right hip: Secondary | ICD-10-CM | POA: Diagnosis not present

## 2022-10-19 DIAGNOSIS — G894 Chronic pain syndrome: Secondary | ICD-10-CM | POA: Diagnosis not present

## 2022-10-19 MED ORDER — HYDROCODONE-ACETAMINOPHEN 5-325 MG PO TABS: 1.0000 | ORAL_TABLET | Freq: Four times a day (QID) | ORAL | 0 refills | Status: DC | PRN

## 2022-10-19 NOTE — Progress Notes (Addendum)
Subjective:    Patient ID: Heather [name], female    DOB: [birth year], 82 y.o.   MRN: [id number]  HPI  HPI   Heather [name] is a 82 y.o. year old female  who  has a past medical history of Anemia, Anxiety, Arthritis, COPD (chronic obstructive pulmonary disease) (HCC), CVA (cerebral vascular accident) (HCC), Depression, Dry eyes (04/14/2014), GERD (gastroesophageal reflux disease), Glaucoma, Gout, HTN (hypertension), and OSA (obstructive sleep apnea).   They are presenting to PM&R clinic as a new patient for pain management evaluation. They were referred or treatment of chronic pain.  Heather [name] reports she has had back pain for many years.  She had lumbar surgery in 1992 and her pain was controlled until about 2014.  Most of her pain is in her lower back.  Sometimes the pain shoots down her legs on both sides.  Pain has been stable for many years.  She has been on hydrocodone every 6 hours which helps control the pain.  Pain is not particularly changed with activity.  She also has history of right rotator cuff repair.  She has had 2 total hip replacements.       Medications tried: Tylenol- helps slightly  Ibuprofen- stomach issues, can't take NSAIDs Vanlaflaxine - helps her pain Gabapentin- made her itch  Lyrica- not sure if she used this Tramadol- didn't help enough Hydrocodone 5 mg helps the pain.  She uses this about 4 times a day.     Other treatments: PT/OT  - Helped briefly but then had CHF and had to stop exercise and it got worse, she likes riding a bicicyle  ESI didn't help Has not tried TENS unit Facet blocks ? Many years ago Ice and heat helped the pain.     Interval History  05/26/22 Heather [name] is here for follow-up of her chronic lower back pain.  Pain will sometimes shoot down her legs.  Hydrocodone continues to help keep her pain under control.  This allows her to do more around the house and be more active.  She is not having any side effects with the hydrocodone.   Patient reports she has received her TENS unit, however due to plan for pacemaker she plans to check with cardiology before using it.   Interval History  07/27/22 Heather [name] is here to follow-up on her chronic pain.  Her worst pain continues to be in her lower back.  Venlafaxine and hydrocodone continue to help her pain and she is not having any significant side effects with these medications. She is not using her TENS unit, says this is due to cardiology recommendation.  She recently had an ICD device implanted by cardiology. She is trying to use her exercise bike to increase her activity levels.   Interval history 10/19/2022 Heather [name] is here for follow-up regarding her chronic pain.  She continues to have a lot of pain in her lower back.  She reports she has been working on cardiac rehabilitation and often she will have pain around her right hip while doing this treatment.  She has a history of bilateral hip replacements.  She reports history of bilateral hip replacements.  Vicodin 5 mg is helping to keep her pain under good control.  This medication takes her pain from a 7 or 8 to a 1 or 2 level.      Pain Inventory Average Pain 6 Pain Right Now 6 My pain is intermittent, sharp, and stabbing  In the last 24  hours, has pain interfered with the following? General activity 0 Relation with others 0 Enjoyment of life 0 What TIME of day is your pain at its worst? morning  Sleep (in general) Fair  Pain is worse with: standing and some activites Pain improves with: rest, medication, and heat Relief from Meds: 10  Family History  Problem Relation Age of Onset   High blood pressure Mother    Aneurysm Father    Diabetes Brother    Pancreatitis Brother    Hypertension Niece    Congestive Heart Failure Niece    Social History   Socioeconomic History   Marital status: Divorced    Spouse name: Not on file   Number of children: Not on file   Years of education: Not on file   Highest  education level: Not on file  Occupational History   Not on file  Tobacco Use   Smoking status: Former    Current packs/day: 0.00    Average packs/day: 1 pack/day for 40.0 years (40.0 ttl pk-yrs)    Types: Cigarettes    Start date: 03/20/1945    Quit date: 03/20/1985    Years since quitting: 37.6   Smokeless tobacco: Never  Vaping Use   Vaping status: Never Used  Substance and Sexual Activity   Alcohol use: No   Drug use: No   Sexual activity: Yes    Birth control/protection: Surgical  Other Topics Concern   Not on file  Social History Narrative   Heather [name] is a 82 year old divorced female who lives alone. She has support of her son and sister.    Social Determinants of Health   Financial Resource Strain: High Risk (11/08/2021)   Overall Financial Resource Strain (CARDIA)    Difficulty of Paying Living Expenses: Very hard  Food Insecurity: No Food Insecurity (03/13/2022)   Hunger Vital Sign    Worried About Running Out of Food in the Last Year: Never true    Ran Out of Food in the Last Year: Never true  Transportation Needs: Unmet Transportation Needs (03/15/2022)   PRAPARE - Transportation    Lack of Transportation (Medical): No    Lack of Transportation (Non-Medical): Yes  Physical Activity: Sufficiently Active (11/08/2021)   Exercise Vital Sign    Days of Exercise per Week: 7 days    Minutes of Exercise per Session: 40 min  Stress: No Stress Concern Present (11/08/2021)   [employer] of Occupational Health - Occupational Stress Questionnaire    Feeling of Stress : Not at all  Social Connections: Moderately Isolated (11/08/2021)   Social Connection and Isolation Panel [NHANES]    Frequency of Communication with Friends and Family: Twice a week    Frequency of Social Gatherings with Friends and Family: Once a week    Attends Religious Services: More than 4 times per year    Active Member of [employer] or Organizations: No    Attends Banker Meetings: Never     Marital Status: Widowed   Past Surgical History:  Procedure Laterality Date   ABDOMINAL HYSTERECTOMY     BACK SURGERY     lumbar-disc   BIV PACEMAKER INSERTION CRT-P N/A 07/12/2022   Procedure: BIV PACEMAKER INSERTION CRT-P;  Surgeon: Duke Salvia, MD;  Location: [facility] INVASIVE CV LAB;  Service: Cardiovascular;  Laterality: N/A;   BUNIONECTOMY Bilateral    FOOT SURGERY Left    repair of "kissing Cousins"   JOINT REPLACEMENT Bilateral    hip  JOINT REPLACEMENT Right 2010 or 2012   knee   KNEE SURGERY     RIGHT/LEFT HEART CATH AND CORONARY ANGIOGRAPHY N/A 10/27/2021   Procedure: RIGHT/LEFT HEART CATH AND CORONARY ANGIOGRAPHY;  Surgeon: Corky Crafts, MD;  Location: [facility] INVASIVE CV LAB;  Service: Cardiovascular;  Laterality: N/A;   SHOULDER ARTHROSCOPY Right    shoulder surgery in [place] about 2000   SHOULDER HEMI-ARTHROPLASTY Left 03/25/2014   Procedure: LEFT SHOULDER HEMI-ARTHROPLASTY;  Surgeon: Vickki Hearing, MD;  Location: [facility] OR[facility];  Service: Orthopedics;  Laterality: Left;   Past Surgical History:  Procedure Laterality Date   ABDOMINAL HYSTERECTOMY     BACK SURGERY     lumbar-disc   BIV PACEMAKER INSERTION CRT-P N/A 07/12/2022   Procedure: BIV PACEMAKER INSERTION CRT-P;  Surgeon: Duke Salvia, MD;  Location: [facility] INVASIVE CV LAB;  Service: Cardiovascular;  Laterality: N/A;   BUNIONECTOMY Bilateral    FOOT SURGERY Left    repair of "kissing Cousins"   JOINT REPLACEMENT Bilateral    hip    JOINT REPLACEMENT Right 2010 or 2012   knee   KNEE SURGERY     RIGHT/LEFT HEART CATH AND CORONARY ANGIOGRAPHY N/A 10/27/2021   Procedure: RIGHT/LEFT HEART CATH AND CORONARY ANGIOGRAPHY;  Surgeon: Corky Crafts, MD;  Location: [facility] INVASIVE CV LAB;  Service: Cardiovascular;  Laterality: N/A;   SHOULDER ARTHROSCOPY Right    shoulder surgery in [place] about 2000   SHOULDER HEMI-ARTHROPLASTY Left 03/25/2014   Procedure: LEFT SHOULDER HEMI-ARTHROPLASTY;  Surgeon: Vickki Hearing, MD;  Location: [facility];  Service: Orthopedics;  Laterality: Left;   Past Medical History:  Diagnosis Date   Anemia    Anxiety    Arthritis    COPD (chronic obstructive pulmonary disease) (HCC)    CVA (cerebral vascular accident) (HCC)    Depression    Dry eyes 04/14/2014   GERD (gastroesophageal reflux disease)    Glaucoma    Gout    HTN (hypertension)    OSA (obstructive sleep apnea)    There were no vitals taken for this visit.  Opioid Risk Score:   Fall Risk Score:  `1  Depression screen [facility] 2/9     09/14/2022    2:13 PM 08/31/2022    3:43 PM 05/26/2022    3:20 PM 05/26/2022   11:05 AM 04/03/2022   10:33 AM 02/02/2022    9:54 AM 11/08/2021    2:26 PM  Depression screen [facility] 2/9  Decreased Interest 0 0 0 0 1 0 0  Down, Depressed, Hopeless 0 0 0 0 0 0 0  PHQ - 2 Score 0 0 0 0 1 0 0  Altered sleeping 0  2  2 0   Tired, decreased energy 1  0  1 2   Change in appetite 1  2  1 1    Feeling bad or failure about yourself  0  0  1 0   Trouble concentrating 0  0  0 0   Moving slowly or fidgety/restless 0  1  1 0   Suicidal thoughts 0  0  0 0   PHQ-9 Score 2  5  7 3    Difficult doing work/chores Not difficult at all    Not difficult at all      Review of Systems  Musculoskeletal:  Positive for back pain.       Pain from right buttock down right calf   All other systems reviewed and are negative.  Objective:   Physical Exam   Gen: no distress, normal appearing HEENT: oral mucosa pink and moist, NCAT Chest: normal effort, normal rate of breathing Abd: soft, non-distended Ext: no edema Psych: pleasant, normal affect Skin: intact Neuro: Alert and oriented, follows commands, cranial nerves II through XII grossly intact, normal speech and language No focal motor deficits noted Sensation intact light touch in all 4 extremities   Musculoskeletal:  Slump test negative bilaterally-anterior hip pain reported on the right Facet loading positive Lumbar paraspinal  muscle tenderness on exam Pain with hip external rotation on the right Mild tenderness over right greater trochanter   10/06/16 IMPRESSION: 1. 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 This cyst partially effaces the thecal sac and impinges on the exiting T12 nerve root. 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 Further description above. 2. Lower lumbar facet arthropathy with grade 1 anterolisthesis at L4-5 and L5-S1. 3. 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 4. Prior L5-S1 decompressive laminectomy with patent thecal sac. 5. Prominent iliopsoas atrophy bilaterally. Status post bilateral hip replacement.    Face loading postive R  SLR- Hip pain on R      Assessment & Plan:  Assessment and Plan: Heather [name] is a 82 y.o. year old female  who  has a past medical history of Anemia, Anxiety, Arthritis, COPD (chronic obstructive pulmonary disease) (HCC), CVA (cerebral vascular accident) (HCC), Depression, Dry eyes (04/14/2014), GERD (gastroesophageal reflux disease), Glaucoma, Gout, HTN (hypertension), and OSA (obstructive sleep apnea).   They are presenting to PM&R clinic as a new patient for treatment of chronic back pain.      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 Shirlee Latch and Karle Plumber  due QT -She forgot pills for pill count today-discussed  importance of bringing to next visit.  Discussed that if she forgets would not be able to continue prescribing the medication -Repeat UDS today -Continue to monitor PDMP   Depression denies SI or HI Well controlled with vanlfexine per patient   Right hip pain with history of bilateral hip replacements -Hip x-ray ordered, medications as above  Addendum, no hydrocodone noted on uds, however that may be consistent as she takes the medications sparingly,  continue close monitor UDS/PDMP/Pill counts

## 2022-10-20 ENCOUNTER — Encounter (HOSPITAL_COMMUNITY): Payer: Medicare Other

## 2022-10-20 ENCOUNTER — Encounter (HOSPITAL_COMMUNITY)
Admission: RE | Admit: 2022-10-20 | Discharge: 2022-10-20 | Disposition: A | Discharge status: Home / Self Care | Payer: Medicare Other | Source: Ambulatory Visit | Attending: Internal Medicine | Admitting: Internal Medicine

## 2022-10-20 DIAGNOSIS — I5022 Chronic systolic (congestive) heart failure: Secondary | ICD-10-CM | POA: Diagnosis not present

## 2022-10-20 DIAGNOSIS — M25551 Pain in right hip: Secondary | ICD-10-CM | POA: Diagnosis not present

## 2022-10-20 NOTE — Progress Notes (Signed)
Daily Session Note  Patient Details  Name: Heather [name] MRN: [id number] Date of Birth: 1940-11-30 Referring Provider:   Flowsheet Row CARDIAC REHAB PHASE II ORIENTATION from 09/14/2022 in [facility] CARDIAC REHABILITATION  Referring Provider Luane School MD       Encounter Date: 10/20/2022  Check In:   Capillary Blood Glucose: No results found for this or any previous visit (from the past 24 hour(s)).   Exercise Prescription Changes - 10/20/22 1000       Response to Exercise   Blood Pressure (Admit) 100/60    Blood Pressure (Exit) 94/50    Heart Rate (Exercise) 84 bpm    Heart Rate (Exit) 66 bpm    Rating of Perceived Exertion (Exercise) 12    Duration Continue with 30 min of aerobic exercise without signs/symptoms of physical distress.    Intensity THRR unchanged      Progression   Progression Continue to progress workloads to maintain intensity without signs/symptoms of physical distress.      Resistance Training   Training Prescription Yes    Weight 4    Reps 10-15      NuStep   Level 2    SPM 69    Minutes 15    METs 1.8      Track   Laps 20    Minutes 15      Oxygen   Maintain Oxygen Saturation 88% or higher             Social History   Tobacco Use  Smoking Status Former   Current packs/day: 0.00   Average packs/day: 1 pack/day for 40.0 years (40.0 ttl pk-yrs)   Types: Cigarettes   Start date: 03/20/1945   Quit date: 03/20/1985   Years since quitting: 37.6  Smokeless Tobacco Never    Goals Met:  Independence with exercise equipment Exercise tolerated well No report of concerns or symptoms today Strength training completed today  Goals Unmet:  Not Applicable  Comments: Pt able to follow exercise prescription today without complaint.  Will continue to monitor for progression.    Dr. Dina Rich is Medical Director for [facility] Cardiac Rehab

## 2022-10-23 ENCOUNTER — Ambulatory Visit (HOSPITAL_COMMUNITY)
Admission: RE | Admit: 2022-10-23 | Discharge: 2022-10-23 | Disposition: A | Discharge status: Home / Self Care | Payer: Medicare Other | Source: Ambulatory Visit | Attending: Physical Medicine & Rehabilitation | Admitting: Physical Medicine & Rehabilitation

## 2022-10-23 ENCOUNTER — Encounter (HOSPITAL_COMMUNITY)
Admission: RE | Admit: 2022-10-23 | Discharge: 2022-10-23 | Disposition: A | Discharge status: Home / Self Care | Payer: Medicare Other | Source: Ambulatory Visit | Attending: Internal Medicine | Admitting: Internal Medicine

## 2022-10-23 ENCOUNTER — Encounter (HOSPITAL_COMMUNITY): Payer: Medicare Other

## 2022-10-23 DIAGNOSIS — I5022 Chronic systolic (congestive) heart failure: Secondary | ICD-10-CM

## 2022-10-23 DIAGNOSIS — M25551 Pain in right hip: Secondary | ICD-10-CM | POA: Insufficient documentation

## 2022-10-23 DIAGNOSIS — M8580 Other specified disorders of bone density and structure, unspecified site: Secondary | ICD-10-CM | POA: Diagnosis not present

## 2022-10-23 DIAGNOSIS — Z96643 Presence of artificial hip joint, bilateral: Secondary | ICD-10-CM | POA: Diagnosis not present

## 2022-10-23 DIAGNOSIS — Z471 Aftercare following joint replacement surgery: Secondary | ICD-10-CM | POA: Diagnosis not present

## 2022-10-23 NOTE — Progress Notes (Signed)
Daily Session Note  Patient Details  Name: Heather [name] MRN: [id number] Date of Birth: [birth year] Referring Provider:   Flowsheet Row CARDIAC REHAB PHASE II ORIENTATION from 09/14/2022 in [facility] CARDIAC REHABILITATION  Referring Provider Luane School MD       Encounter Date: 10/23/2022  Check In:  Session Check In - 10/23/22 0900       Check-In   Supervising physician immediately available to respond to emergencies See telemetry face sheet for immediately available MD    Location [facility]    Staff Present Fabio Pierce, MA, RCEP, CCRP, Dow Adolph, RN, Pleas Koch, RN, BSN    Virtual Visit No    Medication changes reported     No    Fall or balance concerns reported    No    Tobacco Cessation No Change    Warm-up and Cool-down Performed on first and last piece of equipment    Resistance Training Performed Yes    VAD Patient? No      Pain Assessment   Currently in Pain? No/denies             Capillary Blood Glucose: No results found for this or any previous visit (from the past 24 hour(s)).    Social History   Tobacco Use  Smoking Status Former   Current packs/day: 0.00   Average packs/day: 1 pack/day for 40.0 years (40.0 ttl pk-yrs)   Types: Cigarettes   Start date: 03/20/1945   Quit date: 03/20/1985   Years since quitting: 37.6  Smokeless Tobacco Never    Goals Met:  Independence with exercise equipment Exercise tolerated well No report of concerns or symptoms today Strength training completed today  Goals Unmet:  Not Applicable  Comments: Pt able to follow exercise prescription today without complaint.  Will continue to monitor for progression.    Dr. Dina Rich is Medical Director for [facility] Cardiac Rehab

## 2022-10-25 ENCOUNTER — Encounter (HOSPITAL_COMMUNITY)
Admission: RE | Admit: 2022-10-25 | Discharge: 2022-10-25 | Disposition: A | Discharge status: Home / Self Care | Payer: Medicare Other | Source: Ambulatory Visit | Attending: Internal Medicine | Admitting: Internal Medicine

## 2022-10-25 ENCOUNTER — Encounter (HOSPITAL_COMMUNITY): Payer: Medicare Other

## 2022-10-25 DIAGNOSIS — M25551 Pain in right hip: Secondary | ICD-10-CM | POA: Diagnosis not present

## 2022-10-25 DIAGNOSIS — I5022 Chronic systolic (congestive) heart failure: Secondary | ICD-10-CM

## 2022-10-25 NOTE — Progress Notes (Signed)
Daily Session Note  Patient Details  Name: Heather [name] MRN: [id number] Date of Birth: [birth year] Referring Provider:   Flowsheet Row CARDIAC REHAB PHASE II ORIENTATION from 09/14/2022 in [facility] CARDIAC REHABILITATION  Referring Provider Luane School MD       Encounter Date: 10/25/2022  Check In:  Session Check In - 10/25/22 0915       Check-In   Supervising physician immediately available to respond to emergencies See telemetry face sheet for immediately available ER MD    Location [facility]    Staff Present Ross Ludwig, BS, Exercise Physiologist;Jessica Juanetta Gosling, MA, RCEP, CCRP, Dow Adolph, RN, Engineer, mining, RN    Virtual Visit No    Medication changes reported     No    Fall or balance concerns reported    No    Warm-up and Cool-down Performed on first and last piece of Public house manager Performed Yes    VAD Patient? No    PAD/SET Patient? No      Pain Assessment   Currently in Pain? No/denies    Pain Score 0-No pain    Multiple Pain Sites No             Capillary Blood Glucose: No results found for this or any previous visit (from the past 24 hour(s)).    Social History   Tobacco Use  Smoking Status Former   Current packs/day: 0.00   Average packs/day: 1 pack/day for 40.0 years (40.0 ttl pk-yrs)   Types: Cigarettes   Start date: 03/20/1945   Quit date: 03/20/1985   Years since quitting: 37.6  Smokeless Tobacco Never    Goals Met:  Proper associated with RPD/PD & O2 Sat Independence with exercise equipment Using PLB without cueing & demonstrates good technique Exercise tolerated well No report of concerns or symptoms today Strength training completed today  Goals Unmet:  Not Applicable  Comments: Pt able to follow exercise prescription today without complaint.  Will continue to monitor for progression.    Dr. Dina Rich is Medical Director for [facility] Cardiac Rehab

## 2022-10-26 LAB — TOXASSURE SELECT,+ANTIDEPR,UR

## 2022-10-27 ENCOUNTER — Encounter (HOSPITAL_COMMUNITY): Payer: Medicare Other

## 2022-10-27 ENCOUNTER — Encounter (HOSPITAL_COMMUNITY)
Admission: RE | Admit: 2022-10-27 | Discharge: 2022-10-27 | Disposition: A | Discharge status: Home / Self Care | Payer: Medicare Other | Source: Ambulatory Visit | Attending: Internal Medicine | Admitting: Internal Medicine

## 2022-10-27 DIAGNOSIS — I5022 Chronic systolic (congestive) heart failure: Secondary | ICD-10-CM | POA: Diagnosis not present

## 2022-10-27 DIAGNOSIS — M25551 Pain in right hip: Secondary | ICD-10-CM | POA: Diagnosis not present

## 2022-10-27 NOTE — Progress Notes (Addendum)
Daily Session Note  Patient Details  Name: Heather [name] MRN: [id number] Date of Birth: [birth year] Referring Provider:   Flowsheet Row CARDIAC REHAB PHASE II ORIENTATION from 09/14/2022 in [facility] CARDIAC REHABILITATION  Referring Provider Luane School MD       Encounter Date: 10/27/2022  Check In:  Session Check In - 10/27/22 2536       Check-In   Supervising physician immediately available to respond to emergencies See telemetry face sheet for immediately available MD    Location [facility]    Staff Present Ross Ludwig, BS, Exercise Physiologist;Joey Lierman Juanetta Gosling, MA, RCEP, CCRP, Dow Adolph, RN, BSN    Virtual Visit No    Medication changes reported     No    Fall or balance concerns reported    No    Warm-up and Cool-down Performed on first and last piece of equipment    Resistance Training Performed Yes    VAD Patient? No    PAD/SET Patient? No      Pain Assessment   Currently in Pain? No/denies             Capillary Blood Glucose: No results found for this or any previous visit (from the past 24 hour(s)).    Social History   Tobacco Use  Smoking Status Former   Current packs/day: 0.00   Average packs/day: 1 pack/day for 40.0 years (40.0 ttl pk-yrs)   Types: Cigarettes   Start date: 03/20/1945   Quit date: 03/20/1985   Years since quitting: 37.6  Smokeless Tobacco Never    Goals Met:  Independence with exercise equipment Exercise tolerated well No report of concerns or symptoms today Strength training completed today  Goals Unmet:  Not Applicable  Comments: Pt able to follow exercise prescription today without complaint.  Will continue to monitor for progression.  Reviewed home exercise with pt today.  Pt plans to walk and use recumbent bike at home for exercise.  Reviewed THR, RPD, RPE, sign and symptoms, pulse oximetery and when to call 911 or MD.  Also discussed weather considerations and indoor options.  Pt  voiced understanding.     Dr. Dina Rich is Medical Director for [facility] -  Cardiac Rehab

## 2022-10-30 ENCOUNTER — Encounter (HOSPITAL_COMMUNITY): Payer: Medicare Other

## 2022-10-30 ENCOUNTER — Encounter (HOSPITAL_COMMUNITY)
Admission: RE | Admit: 2022-10-30 | Discharge: 2022-10-30 | Disposition: A | Discharge status: Home / Self Care | Payer: Medicare Other | Source: Ambulatory Visit | Attending: Internal Medicine | Admitting: Internal Medicine

## 2022-10-30 DIAGNOSIS — I5022 Chronic systolic (congestive) heart failure: Secondary | ICD-10-CM

## 2022-10-30 DIAGNOSIS — M25551 Pain in right hip: Secondary | ICD-10-CM | POA: Diagnosis not present

## 2022-10-30 NOTE — Progress Notes (Signed)
Daily Session Note  Patient Details  Name: TANGELA UCCELLO MRN: [id number] Date of Birth: [birth year] Referring Provider:   Flowsheet Row CARDIAC REHAB PHASE II ORIENTATION from 09/14/2022 in [facility] CARDIAC REHABILITATION  Referring Provider Luane School MD       Encounter Date: 10/30/2022  Check In:  Session Check In - 10/30/22 0900       Check-In   Supervising physician immediately available to respond to emergencies See telemetry face sheet for immediately available ER MD    Location [facility]    Staff Present Rodena Medin, RN, BSN;Jessica Juanetta Gosling, MA, RCEP, CCRP, CCET;Heather Fredric Mare, [place], Exercise Physiologist    Virtual Visit No    Medication changes reported     No    Fall or balance concerns reported    No    Warm-up and Cool-down Performed on first and last piece of equipment    Resistance Training Performed Yes    VAD Patient? No    PAD/SET Patient? No      Pain Assessment   Currently in Pain? No/denies    Pain Score 0-No pain    Multiple Pain Sites No             Capillary Blood Glucose: No results found for this or any previous visit (from the past 24 hour(s)).    Social History   Tobacco Use  Smoking Status Former   Current packs/day: 0.00   Average packs/day: 1 pack/day for 40.0 years (40.0 ttl pk-yrs)   Types: Cigarettes   Start date: 03/20/1945   Quit date: 03/20/1985   Years since quitting: 37.6  Smokeless Tobacco Never    Goals Met:  Independence with exercise equipment Exercise tolerated well No report of concerns or symptoms today Strength training completed today  Goals Unmet:  Not Applicable  Comments: Pt able to follow exercise prescription today without complaint.  Will continue to monitor for progression.    Dr. Dina Rich is Medical Director for [facility] Cardiac Rehab

## 2022-11-01 ENCOUNTER — Encounter (HOSPITAL_COMMUNITY): Payer: Medicare Other

## 2022-11-01 ENCOUNTER — Encounter (HOSPITAL_COMMUNITY)
Admission: RE | Admit: 2022-11-01 | Discharge: 2022-11-01 | Disposition: A | Discharge status: Home / Self Care | Payer: Medicare Other | Source: Ambulatory Visit | Attending: Internal Medicine | Admitting: Internal Medicine

## 2022-11-01 DIAGNOSIS — I5022 Chronic systolic (congestive) heart failure: Secondary | ICD-10-CM | POA: Diagnosis not present

## 2022-11-01 DIAGNOSIS — M25551 Pain in right hip: Secondary | ICD-10-CM | POA: Diagnosis not present

## 2022-11-01 NOTE — Progress Notes (Signed)
Daily Session Note  Patient Details  Name: Heather [name] MRN: [id number] Date of Birth: 05-Aug-1940 Referring Provider:   Flowsheet Row CARDIAC REHAB PHASE II ORIENTATION from 09/14/2022 in [facility] CARDIAC REHABILITATION  Referring Provider Luane School MD       Encounter Date: 11/01/2022  Check In:  Session Check In - 11/01/22 0943       Check-In   Supervising physician immediately available to respond to emergencies See telemetry face sheet for immediately available MD    Location [facility]    Staff Present Rodena Medin, RN, BSN;Lalita Ebel Juanetta Gosling, MA, RCEP, CCRP, CCET;Heather Gaynell Face, Exercise Physiologist;Hillary Leonidas Romberg BSN, RN    Virtual Visit No    Medication changes reported     No    Fall or balance concerns reported    No    Warm-up and Cool-down Performed on first and last piece of equipment    Resistance Training Performed Yes    VAD Patient? No    PAD/SET Patient? No      Pain Assessment   Currently in Pain? Yes             Capillary Blood Glucose: No results found for this or any previous visit (from the past 24 hour(s)).    Social History   Tobacco Use  Smoking Status Former   Current packs/day: 0.00   Average packs/day: 1 pack/day for 40.0 years (40.0 ttl pk-yrs)   Types: Cigarettes   Start date: 03/20/1945   Quit date: 03/20/1985   Years since quitting: 37.6  Smokeless Tobacco Never    Goals Met:  Independence with exercise equipment Exercise tolerated well No report of concerns or symptoms today Strength training completed today  Goals Unmet:  Not Applicable  Comments: Pt able to follow exercise prescription today without complaint.  Will continue to monitor for progression.    Dr. Dina Rich is Medical Director for [facility] Cardiac Rehab

## 2022-11-03 ENCOUNTER — Encounter (HOSPITAL_COMMUNITY): Payer: Medicare Other

## 2022-11-03 ENCOUNTER — Encounter (HOSPITAL_COMMUNITY)
Admission: RE | Admit: 2022-11-03 | Discharge: 2022-11-03 | Disposition: A | Discharge status: Home / Self Care | Payer: Medicare Other | Source: Ambulatory Visit | Attending: Internal Medicine | Admitting: Internal Medicine

## 2022-11-03 DIAGNOSIS — M25551 Pain in right hip: Secondary | ICD-10-CM | POA: Diagnosis not present

## 2022-11-03 DIAGNOSIS — I5022 Chronic systolic (congestive) heart failure: Secondary | ICD-10-CM

## 2022-11-03 NOTE — Progress Notes (Signed)
Daily Session Note  Patient Details  Name: Heather [name] MRN: [id number] Date of Birth: [birth year] Referring Provider:   Flowsheet Row CARDIAC REHAB PHASE II ORIENTATION from 09/14/2022 in [facility] CARDIAC REHABILITATION  Referring Provider Luane School MD       Encounter Date: 11/03/2022  Check In:  Session Check In - 11/03/22 4098       Check-In   Supervising physician immediately available to respond to emergencies See telemetry face sheet for immediately available MD    Location [facility]    Staff Present Ross Ludwig, BS, Exercise Physiologist;Audre Cenci Juanetta Gosling, MA, RCEP, CCRP, CCET;Other;Daphyne Daphine Deutscher, RN, BSN   Kary Kos, [place], CEP   Virtual Visit No    Medication changes reported     No    Fall or balance concerns reported    No    Warm-up and Cool-down Performed on first and last piece of equipment    Resistance Training Performed Yes    VAD Patient? No    PAD/SET Patient? No      Pain Assessment   Currently in Pain? No/denies             Capillary Blood Glucose: No results found for this or any previous visit (from the past 24 hour(s)).    Social History   Tobacco Use  Smoking Status Former   Current packs/day: 0.00   Average packs/day: 1 pack/day for 40.0 years (40.0 ttl pk-yrs)   Types: Cigarettes   Start date: 03/20/1945   Quit date: 03/20/1985   Years since quitting: 37.6  Smokeless Tobacco Never    Goals Met:  Independence with exercise equipment Exercise tolerated well No report of concerns or symptoms today Strength training completed today  Goals Unmet:  Not Applicable  Comments: Pt able to follow exercise prescription today without complaint.  Will continue to monitor for progression.    Dr. Dina Rich is Medical Director for [facility]

## 2022-11-06 ENCOUNTER — Encounter (HOSPITAL_COMMUNITY): Payer: Medicare Other

## 2022-11-07 ENCOUNTER — Telehealth (HOSPITAL_BASED_OUTPATIENT_CLINIC_OR_DEPARTMENT_OTHER): Payer: Self-pay | Admitting: Pulmonary Disease

## 2022-11-07 DIAGNOSIS — G4734 Idiopathic sleep related nonobstructive alveolar hypoventilation: Secondary | ICD-10-CM

## 2022-11-07 DIAGNOSIS — J449 Chronic obstructive pulmonary disease, unspecified: Secondary | ICD-10-CM

## 2022-11-07 DIAGNOSIS — J9611 Chronic respiratory failure with hypoxia: Secondary | ICD-10-CM

## 2022-11-07 NOTE — Telephone Encounter (Signed)
ONO with RA 10/11/22 >> test time 7 hrs 37 min.  Baseline SpO2 87%, low SpO2 81%.  Spent 6 hrs 20 min with an SpO2 < 88%.   Please let her know that her oxygen level is low at night.  I have sent an order to get her set up with 2 liters oxygen at night with sleep.

## 2022-11-08 ENCOUNTER — Encounter (HOSPITAL_COMMUNITY): Payer: Medicare Other

## 2022-11-09 ENCOUNTER — Ambulatory Visit (INDEPENDENT_AMBULATORY_CARE_PROVIDER_SITE_OTHER): Payer: Medicare Other

## 2022-11-09 VITALS — Ht 63.0 in | Wt 134.4 lb

## 2022-11-09 DIAGNOSIS — Z789 Other specified health status: Secondary | ICD-10-CM

## 2022-11-09 DIAGNOSIS — Z0000 Encounter for general adult medical examination without abnormal findings: Secondary | ICD-10-CM | POA: Diagnosis not present

## 2022-11-09 NOTE — Patient Instructions (Signed)
Heather [name] , Thank you for taking time to come for your Medicare Wellness Visit. I appreciate your ongoing commitment to your health goals. Please review the following plan we discussed and let me know if I can assist you in the future.   Referrals/Orders/Follow-Ups/Clinician Recommendations:  You are due for the vaccines checked below. You may have these done at your preferred pharmacy. Please have them fax the office proof of the vaccines so that we can update your chart.   [x]  Flu (due annually) [x]  Shingrix (Shingles vaccine) []  Pneumonia Vaccines []  TDAP (Tetanus) Vaccine every 10 years [x]  Covid-19 A new booster will be available in a few weeks.    This is a list of the screening recommended for you and due dates:  Health Maintenance  Topic Date Due   Eye exam for diabetics  Never done   Zoster (Shingles) Vaccine (1 of 2) 01/05/1960   Flu Shot  10/05/2022   COVID-19 Vaccine (5 - 2023-24 season) 11/05/2022   Hemoglobin A1C  11/26/2022   Yearly kidney function blood test for diabetes  08/17/2023   Yearly kidney health urinalysis for diabetes  08/31/2023   Complete foot exam   08/31/2023   Mammogram  09/13/2023   Medicare Annual Wellness Visit  11/09/2023   DTaP/Tdap/Td vaccine (2 - Td or Tdap) 01/12/2030   Pneumonia Vaccine  Completed   DEXA scan (bone density measurement)  Completed   HPV Vaccine  Aged Out   Hepatitis C Screening  Discontinued    Advanced directives: (Declined) Advance directive discussed with you today. Even though you declined this today, please call our office should you change your mind, and we can give you the proper paperwork for you to fill out.  Next Medicare Annual Wellness Visit scheduled for next year: Yes  Preventive Care 81 Years and Older, Female Preventive care refers to lifestyle choices and visits with your health care provider that can promote health and wellness. Preventive care visits are also called wellness exams. What can I expect for  my preventive care visit? Counseling Your health care provider may ask you questions about your: Medical history, including: Past medical problems. Family medical history. Pregnancy and menstrual history. History of falls. Current health, including: Memory and ability to understand (cognition). Emotional well-being. Home life and relationship well-being. Sexual activity and sexual health. Lifestyle, including: Alcohol, nicotine or tobacco, and drug use. Access to firearms. Diet, exercise, and sleep habits. Work and work Astronomer. Sunscreen use. Safety issues such as seatbelt and bike helmet use. Physical exam Your health care provider will check your: Height and weight. These may be used to calculate your BMI (body mass index). BMI is a measurement that tells if you are at a healthy weight. Waist circumference. This measures the distance around your waistline. This measurement also tells if you are at a healthy weight and may help predict your risk of certain diseases, such as type 2 diabetes and high blood pressure. Heart rate and blood pressure. Body temperature. Skin for abnormal spots. What immunizations do I need?  Vaccines are usually given at various ages, according to a schedule. Your health care provider will recommend vaccines for you based on your age, medical history, and lifestyle or other factors, such as travel or where you work. What tests do I need? Screening Your health care provider may recommend screening tests for certain conditions. This may include: Lipid and cholesterol levels. Hepatitis C test. Hepatitis B test. HIV (human immunodeficiency virus) test. STI (sexually transmitted  infection) testing, if you are at risk. Lung cancer screening. Colorectal cancer screening. Diabetes screening. This is done by checking your blood sugar (glucose) after you have not eaten for a while (fasting). Mammogram. Talk with your health care provider about how often  you should have regular mammograms. BRCA-related cancer screening. This may be done if you have a family history of breast, ovarian, tubal, or peritoneal cancers. Bone density scan. This is done to screen for osteoporosis. Talk with your health care provider about your test results, treatment options, and if necessary, the need for more tests. Follow these instructions at home: Eating and drinking  Eat a diet that includes fresh fruits and vegetables, whole grains, lean protein, and low-fat dairy products. Limit your intake of foods with high amounts of sugar, saturated fats, and salt. Take vitamin and mineral supplements as recommended by your health care provider. Do not drink alcohol if your health care provider tells you not to drink. If you drink alcohol: Limit how much you have to 0-1 drink a day. Know how much alcohol is in your drink. In the [place] one drink equals one 12 oz bottle of beer (355 mL), one 5 oz glass of wine (148 mL), or one 1 oz glass of hard liquor (44 mL). Lifestyle Brush your teeth every morning and night with fluoride toothpaste. Floss one time each day. Exercise for at least 30 minutes 5 or more days each week. Do not use any products that contain nicotine or tobacco. These products include cigarettes, chewing tobacco, and vaping devices, such as e-cigarettes. If you need help quitting, ask your health care provider. Do not use drugs. If you are sexually active, practice safe sex. Use a condom or other form of protection in order to prevent STIs. Take aspirin only as told by your health care provider. Make sure that you understand how much to take and what form to take. Work with your health care provider to find out whether it is safe and beneficial for you to take aspirin daily. Ask your health care provider if you need to take a cholesterol-lowering medicine (statin). Find healthy ways to manage stress, such as: Meditation, yoga, or listening to  music. Journaling. Talking to a trusted person. Spending time with friends and family. Minimize exposure to UV radiation to reduce your risk of skin cancer. Safety Always wear your seat belt while driving or riding in a vehicle. Do not drive: If you have been drinking alcohol. Do not ride with someone who has been drinking. When you are tired or distracted. While texting. If you have been using any mind-altering substances or drugs. Wear a helmet and other protective equipment during sports activities. If you have firearms in your house, make sure you follow all gun safety procedures. What's next? Visit your health care provider once a year for an annual wellness visit. Ask your health care provider how often you should have your eyes and teeth checked. Stay up to date on all vaccines. This information is not intended to replace advice given to you by your health care provider. Make sure you discuss any questions you have with your health care provider. Document Revised: 08/18/2020 Document Reviewed: 08/18/2020 Elsevier Patient Education  2024 [employer]. Understanding Your Risk for Falls Millions of people have serious injuries from falls each year. It is important to understand your risk of falling. Talk with your health care provider about your risk and what you can do to lower it. If you do have  a serious fall, make sure to tell your provider. Falling once raises your risk of falling again. How can falls affect me? Serious injuries from falls are common. These include: Broken bones, such as hip fractures. Head injuries, such as traumatic brain injuries (TBI) or concussions. A fear of falling can cause you to avoid activities and stay at home. This can make your muscles weaker and raise your risk for a fall. What can increase my risk? There are a number of risk factors that increase your risk for falling. The more risk factors you have, the higher your risk of falling. Serious  injuries from a fall happen most often to people who are older than 81 years old. Teenagers and young adults ages 33-29 are also at higher risk. Common risk factors include: Weakness in the lower body. Being generally weak or confused due to long-term (chronic) illness. Dizziness or balance problems. Poor vision. Medicines that cause dizziness or drowsiness. These may include: Medicines for your blood pressure, heart, anxiety, insomnia, or swelling (edema). Pain medicines. Muscle relaxants. Other risk factors include: Drinking alcohol. Having had a fall in the past. Having foot pain or wearing improper footwear. Working at a dangerous job. Having any of the following in your home: Tripping hazards, such as floor clutter or loose rugs. Poor lighting. Pets. Having dementia or memory loss. What actions can I take to lower my risk of falling?     Physical activity Stay physically fit. Do strength and balance exercises. Consider taking a regular class to build strength and balance. Yoga and tai chi are good options. Vision Have your eyes checked every year and your prescription for glasses or contacts updated as needed. Shoes and walking aids Wear non-skid shoes. Wear shoes that have rubber soles and low heels. Do not wear high heels. Do not walk around the house in socks or slippers. Use a cane or walker as told by your provider. Home safety Attach secure railings on both sides of your stairs. Install grab bars for your bathtub, shower, and toilet. Use a non-skid mat in your bathtub or shower. Attach bath mats securely with double-sided, non-slip rug tape. Use good lighting in all rooms. Keep a flashlight near your bed. Make sure there is a clear path from your bed to the bathroom. Use night-lights. Do not use throw rugs. Make sure all carpeting is taped or tacked down securely. Remove all clutter from walkways and stairways, including extension cords. Repair uneven or broken  steps and floors. Avoid walking on icy or slippery surfaces. Walk on the grass instead of on icy or slick sidewalks. Use ice melter to get rid of ice on walkways in the winter. Use a cordless phone. Questions to ask your health care provider Can you help me check my risk for a fall? Do any of my medicines make me more likely to fall? Should I take a vitamin D supplement? What exercises can I do to improve my strength and balance? Should I make an appointment to have my vision checked? Do I need a bone density test to check for weak bones (osteoporosis)? Would it help to use a cane or a walker? Where to find more information Centers for Disease Control and Prevention, [employer]AD[employer]: TonerPromos.no Community-Based Fall Prevention Programs: TonerPromos.no [employer] on Aging: BaseRingTones.pl Contact a health care provider if: You fall at home. You are afraid of falling at home. You feel weak, drowsy, or dizzy. This information is not intended to replace advice given to you  by your health care provider. Make sure you discuss any questions you have with your health care provider. Document Revised: 10/24/2021 Document Reviewed: 10/24/2021 Elsevier Patient Education  2024 [employer].

## 2022-11-09 NOTE — Progress Notes (Signed)
Because this visit was a virtual/telehealth visit,  certain criteria was not obtained, such a blood pressure, CBG if patient is a diabetic, and timed get up and go. Any medications not marked as "taking" was not mentioned during the medication reconciliation part of the visit. Any vitals not documented were not able to be obtained due to this being a telehealth visit. Vitals that have been documented are verbally provided by the patient.  Patient was unable to self-report a recent blood pressure reading due to a lack of equipment at home via telehealth.  Subjective:   Heather [name] is a 82 y.o. female who presents for Medicare Annual (Subsequent) preventive examination.  Visit Complete: Virtual  I connected with  Heather [name] on 11/09/22 by a audio enabled telemedicine application and verified that I am speaking with the correct person using two identifiers.  Patient Location: Home  Provider Location: Home Office  I discussed the limitations of evaluation and management by telemedicine. The patient expressed understanding and agreed to proceed.  Patient Medicare AWV questionnaire was completed by the patient on na; I have confirmed that all information answered by patient is correct and no changes since this date.  Review of Systems     Cardiac Risk Factors include: advanced age (>84men, >65 women);diabetes mellitus;dyslipidemia;hypertension     Objective:    Today's Vitals   11/09/22 1523  Weight: 134 lb 6.4 oz (61 kg)  Height: 5\' 3"  (1.6 m)  PainSc: 0-No pain   Body mass index is 23.81 kg/m.     11/09/2022    3:22 PM 09/14/2022   12:43 PM 07/12/2022    8:45 AM 03/13/2022    9:00 PM 11/08/2021    2:26 PM 10/07/2021    3:00 PM 10/07/2021   12:08 PM  Advanced Directives  Does Patient Have a Medical Advance Directive? No Yes Yes Unable to assess, patient is non-responsive or altered mental status Yes No No  Type of Special educational needs teacher of [employer] Power of  [place];Living will  Healthcare Power of Attorney    Does patient want to make changes to medical advance directive?  No - Patient declined No - Patient declined  No - Patient declined    Copy of Healthcare Power of Attorney in Chart?   No - copy requested  No - copy requested    Would patient like information on creating a medical advance directive? No - Patient declined     No - Patient declined No - Patient declined    Current Medications (verified) Outpatient Encounter Medications as of 11/09/2022  Medication Sig   acetaminophen (TYLENOL) 500 MG tablet Take 500 mg by mouth every 6 (six) hours as needed for mild pain or moderate pain.   allopurinol (ZYLOPRIM) 300 MG tablet TAKE 1/2 TABLET BY MOUTH ONCE DAILY   aspirin (ASPIRIN CHILDRENS) 81 MG chewable tablet Chew 1 tablet (81 mg total) by mouth daily.   atorvastatin (LIPITOR) 20 MG tablet Take 1 tablet (20 mg total) by mouth daily.   bisoprolol (ZEBETA) 5 MG tablet Take 2.5 mg by mouth daily.   chlorpheniramine (CHLOR-TRIMETON) 4 MG tablet Take 4 mg by mouth daily as needed for allergies.   Cholecalciferol (DIALYVITE VITAMIN D 5000 PO) Take 5,000 Units by mouth daily.   dapagliflozin propanediol (FARXIGA) 10 MG TABS tablet Take 1 tablet (10 mg total) by mouth daily.   digoxin (LANOXIN) 0.125 MG tablet Take 0.5 tablets (0.0625 mg total) by mouth daily.  famotidine (PEPCID) 20 MG tablet Take 1 tablet (20 mg total) by mouth at bedtime. One after supper (Patient taking differently: Take 20 mg by mouth 2 (two) times daily.)   Fluticasone-Umeclidin-Vilant (TRELEGY ELLIPTA) 200-62.5-25 MCG/ACT AEPB Inhale 1 puff into the lungs daily.   furosemide (LASIX) 40 MG tablet Take 60 mg by mouth daily.   hydrALAZINE (APRESOLINE) 25 MG tablet Take 0.5 tablets (12.5 mg total) by mouth 3 (three) times daily.   HYDROcodone-acetaminophen (NORCO/VICODIN) 5-325 MG tablet Take 1 tablet by mouth every 6 (six) hours as needed for moderate pain.   isosorbide  mononitrate (IMDUR) 30 MG 24 hr tablet Take 0.5 tablets (15 mg total) by mouth daily.   ketoconazole (NIZORAL) 2 % cream Apply 1 Application topically as needed for irritation.   losartan (COZAAR) 25 MG tablet Take 1 tablet (25 mg total) by mouth daily.   Multiple Vitamins-Minerals (MULTIVITAMIN WITH MINERALS) tablet Take 1 tablet by mouth daily.   PROAIR HFA 108 (90 Base) MCG/ACT inhaler Inhale 2 puffs into the lungs every 6 (six) hours as needed for wheezing or shortness of breath.   Propylene Glycol (SYSTANE COMPLETE) 0.6 % SOLN Place 1 drop into both eyes 2 (two) times daily.   spironolactone (ALDACTONE) 25 MG tablet Take 1 tablet (25 mg total) by mouth daily.   venlafaxine XR (EFFEXOR-XR) 75 MG 24 hr capsule Take 1 capsule (75 mg total) by mouth See admin instructions.   No facility-administered encounter medications on file as of 11/09/2022.    Allergies (verified) Lovastatin and Lisinopril   History: Past Medical History:  Diagnosis Date   Anemia    Anxiety    Arthritis    COPD (chronic obstructive pulmonary disease) (HCC)    CVA (cerebral vascular accident) (HCC)    Depression    Dry eyes 04/14/2014   GERD (gastroesophageal reflux disease)    Glaucoma    Gout    HTN (hypertension)    OSA (obstructive sleep apnea)    Past Surgical History:  Procedure Laterality Date   ABDOMINAL HYSTERECTOMY     BACK SURGERY     lumbar-disc   BIV PACEMAKER INSERTION CRT-P N/A 07/12/2022   Procedure: BIV PACEMAKER INSERTION CRT-P;  Surgeon: Duke Salvia, MD;  Location: [facility] INVASIVE CV LAB;  Service: Cardiovascular;  Laterality: N/A;   BUNIONECTOMY Bilateral    FOOT SURGERY Left    repair of "kissing Cousins"   JOINT REPLACEMENT Bilateral    hip    JOINT REPLACEMENT Right 2010 or 2012   knee   KNEE SURGERY     RIGHT/LEFT HEART CATH AND CORONARY ANGIOGRAPHY N/A 10/27/2021   Procedure: RIGHT/LEFT HEART CATH AND CORONARY ANGIOGRAPHY;  Surgeon: Corky Crafts, MD;  Location: [facility]  INVASIVE CV LAB;  Service: Cardiovascular;  Laterality: N/A;   SHOULDER ARTHROSCOPY Right    shoulder surgery in [place] about 2000   SHOULDER HEMI-ARTHROPLASTY Left 03/25/2014   Procedure: LEFT SHOULDER HEMI-ARTHROPLASTY;  Surgeon: Vickki Hearing, MD;  Location: [facility] ORS;  Service: Orthopedics;  Laterality: Left;   Family History  Problem Relation Age of Onset   High blood pressure Mother    Aneurysm Father    Diabetes Brother    Pancreatitis Brother    Hypertension Niece    Congestive Heart Failure Niece    Social History   Socioeconomic History   Marital status: Divorced    Spouse name: Not on file   Number of children: Not on file   Years of education: Not on  file   Highest education level: Not on file  Occupational History   Not on file  Tobacco Use   Smoking status: Former    Current packs/day: 0.00    Average packs/day: 1 pack/day for 40.0 years (40.0 ttl pk-yrs)    Types: Cigarettes    Start date: 03/20/1945    Quit date: 03/20/1985    Years since quitting: 37.6   Smokeless tobacco: Never  Vaping Use   Vaping status: Never Used  Substance and Sexual Activity   Alcohol use: No   Drug use: No   Sexual activity: Yes    Birth control/protection: Surgical  Other Topics Concern   Not on file  Social History Narrative   Mrs Reffner is a 82 year old divorced female who lives alone. She has support of her son and sister.    Social Determinants of Health   Financial Resource Strain: High Risk (11/09/2022)   Overall Financial Resource Strain (CARDIA)    Difficulty of Paying Living Expenses: Hard  Food Insecurity: No Food Insecurity (11/09/2022)   Hunger Vital Sign    Worried About Running Out of Food in the Last Year: Never true    Ran Out of Food in the Last Year: Never true  Transportation Needs: No Transportation Needs (11/09/2022)   PRAPARE - Administrator, Civil Service (Medical): No    Lack of Transportation (Non-Medical): No  Physical Activity:  Sufficiently Active (11/09/2022)   Exercise Vital Sign    Days of Exercise per Week: 7 days    Minutes of Exercise per Session: 30 min  Stress: No Stress Concern Present (11/09/2022)   [employer] of Occupational Health - Occupational Stress Questionnaire    Feeling of Stress : Not at all  Social Connections: Moderately Isolated (11/09/2022)   Social Connection and Isolation Panel [NHANES]    Frequency of Communication with Friends and Family: More than three times a week    Frequency of Social Gatherings with Friends and Family: More than three times a week    Attends Religious Services: More than 4 times per year    Active Member of [employer] or Organizations: No    Attends Banker Meetings: Never    Marital Status: Widowed    Tobacco Counseling Counseling given: Yes   Clinical Intake:  Pre-visit preparation completed: Yes  Pain : No/denies pain Pain Score: 0-No pain     BMI - recorded: 23.81 Nutritional Status: BMI of 19-24  Normal Nutritional Risks: None Diabetes: Yes CBG done?: No Did pt. bring in CBG monitor from home?: No  How often do you need to have someone help you when you read instructions, pamphlets, or other written materials from your doctor or pharmacy?: 1 - Never  Interpreter Needed?: No  Information entered by :: Abby Truth Barot, CMA   Activities of Daily Living    11/09/2022    3:31 PM 03/13/2022    9:00 PM  In your present state of health, do you have any difficulty performing the following activities:  Hearing? 0 0  Vision? 0 0  Difficulty concentrating or making decisions? 0 0  Walking or climbing stairs? 0 0  Dressing or bathing? 0 0  Doing errands, shopping? 0 0  Preparing Food and eating ? N   Using the Toilet? N   In the past six months, have you accidently leaked urine? N   Do you have problems with loss of bowel control? N   Managing your Medications? N  Managing your Finances? N   Housekeeping or managing your  Housekeeping? N     Patient Care Team: [name], MD as PCP - General (Internal Medicine) Mallipeddi, Orion Modest, MD as PCP - Cardiology (Cardiology) Beryle Beams, MD as Consulting Physician (Neurology) Vickki Hearing, MD as Consulting Physician (Orthopedic Surgery)  Indicate any recent Medical Services you may have received from other than [facility] providers in the past year (date may be approximate).     Assessment:   This is a routine wellness examination for [place].  Hearing/Vision screen Hearing Screening - Comments:: Patient denies any hearing difficulties.   Vision Screening - Comments:: Wears rx glasses - up to date with routine eye exams Dr.Cotter at [facility] [place]    Goals Addressed             This Visit's Progress    Patient Stated       I would like to be able to stop using the cane and keep my weight down       Depression Screen    11/09/2022    3:26 PM 10/19/2022   11:03 AM 09/14/2022    2:13 PM 08/31/2022    3:43 PM 05/26/2022    3:20 PM 05/26/2022   11:05 AM 04/03/2022   10:33 AM  PHQ 2/9 Scores  PHQ - 2 Score 0 0 0 0 0 0 1  PHQ- 9 Score   2  5  7     Fall Risk    11/09/2022    3:31 PM 10/19/2022   11:03 AM 09/14/2022   12:44 PM 08/31/2022    3:43 PM 07/27/2022   10:19 AM  Fall Risk   Falls in the past year? 0 0 0 0 0  Number falls in past yr: 0 0 0 0   Injury with Fall? 0 0 0 0   Risk for fall due to : Impaired balance/gait;Impaired mobility  Impaired mobility;Impaired balance/gait;Other (Comment) No Fall Risks   Risk for fall due to: Comment   uses cane    Follow up Falls prevention discussed;Education provided  Falls prevention discussed Falls evaluation completed     MEDICARE RISK AT HOME: Medicare Risk at Home Any stairs in or around the home?: No If so, are there any without handrails?: No Home free of loose throw rugs in walkways, pet beds, electrical cords, etc?: Yes Adequate lighting in your home to reduce risk of  falls?: Yes Life alert?: No Use of a cane, walker or w/c?: Yes Grab bars in the bathroom?: Yes Shower chair or bench in shower?: Yes Elevated toilet seat or a handicapped toilet?: Yes  TIMED UP AND GO:  Was the test performed?  No    Cognitive Function:        11/09/2022    3:25 PM 11/08/2021    2:27 PM 06/30/2020   10:34 AM  6CIT Screen  What Year? 0 points 0 points 0 points  What month? 0 points 0 points 0 points  What time? 0 points 0 points 0 points  Count back from 20 0 points 0 points 0 points  Months in reverse 0 points 0 points 0 points  Repeat phrase 0 points 0 points 4 points  Total Score 0 points 0 points 4 points    Immunizations Immunization History  Administered Date(s) Administered   Fluad Quad(high Dose 65+) 12/05/2021   Influenza-Unspecified 12/28/2017, 12/07/2019, 11/17/2020   Moderna SARS-COV2 Booster Vaccination 07/02/2020, 02/01/2022   Moderna Sars-Covid-2 Vaccination  05/01/2019, 05/30/2019, 12/04/2019, 07/02/2020   PNEUMOCOCCAL CONJUGATE-20 04/28/2021   Pneumococcal Polysaccharide-23 04/11/2016, 03/10/2020   RSV,unspecified 02/01/2022   Tdap 01/13/2020   Zoster, Live 04/14/2016    TDAP status: Up to date  Flu Vaccine status: Due, Education has been provided regarding the importance of this vaccine. Advised may receive this vaccine at local pharmacy or Health Dept. Aware to provide a copy of the vaccination record if obtained from local pharmacy or Health Dept. Verbalized acceptance and understanding.  Pneumococcal vaccine status: Up to date  Covid-19 vaccine status: Information provided on how to obtain vaccines.   Qualifies for Shingles Vaccine? Yes   Zostavax completed No   Shingrix Completed?: No.    Education has been provided regarding the importance of this vaccine. Patient has been advised to call insurance company to determine out of pocket expense if they have not yet received this vaccine. Advised may also receive vaccine at local  pharmacy or Health Dept. Verbalized acceptance and understanding.  Screening Tests Health Maintenance  Topic Date Due   OPHTHALMOLOGY EXAM  Never done   Zoster Vaccines- Shingrix (1 of 2) 01/05/1961   INFLUENZA VACCINE  10/05/2022   COVID-19 Vaccine (5 - 2023-24 season) 11/05/2022   Medicare Annual Wellness (AWV)  11/09/2022   HEMOGLOBIN A1C  11/26/2022   Diabetic kidney evaluation - eGFR measurement  08/17/2023   Diabetic kidney evaluation - Urine ACR  08/31/2023   FOOT EXAM  08/31/2023   MAMMOGRAM  09/13/2023   DTaP/Tdap/Td (2 - Td or Tdap) 01/12/2030   Pneumonia Vaccine 28+ Years old  Completed   DEXA SCAN  Completed   HPV VACCINES  Aged Out   Hepatitis C Screening  Discontinued    Health Maintenance  Health Maintenance Due  Topic Date Due   OPHTHALMOLOGY EXAM  Never done   Zoster Vaccines- Shingrix (1 of 2) 01/05/1961   INFLUENZA VACCINE  10/05/2022   COVID-19 Vaccine (5 - 2023-24 season) 11/05/2022   Medicare Annual Wellness (AWV)  11/09/2022    Colorectal cancer screening: No longer required.   Mammogram status: Completed 09/13/2022. Repeat every year  Bone Density status: not age appropriate for this patient.   Lung Cancer Screening: (Low Dose CT Chest recommended if Age 17-80 years, 20 pack-year currently smoking OR have quit w/in 15years.) does not qualify.   Lung Cancer Screening Referral: na  Additional Screening:  Hepatitis C Screening: does not qualify; Completed 11/03/2021  Vision Screening: Recommended annual ophthalmology exams for early detection of glaucoma and other disorders of the eye. Is the patient up to date with their annual eye exam?  Yes  Who is the provider or what is the name of the office in which the patient attends annual eye exams? [facility] If pt is not established with a provider, would they like to be referred to a provider to establish care? No .   Dental Screening: Recommended annual dental exams for proper oral  hygiene  Diabetic Foot Exam: Diabetic Foot Exam: Completed 08/31/2022  Community Resource Referral / Chronic Care Management: CRR required this visit?  Yes   CCM required this visit?  No     Plan:     I have personally reviewed and noted the following in the patient's chart:   Medical and social history Use of alcohol, tobacco or illicit drugs  Current medications and supplements including opioid prescriptions. Patient is currently taking opioid prescriptions. Information provided to patient regarding non-opioid alternatives. Patient advised to discuss non-opioid  treatment plan with their provider. Functional ability and status Nutritional status Physical activity Advanced directives List of other physicians Hospitalizations, surgeries, and ER visits in previous 12 months Vitals Screenings to include cognitive, depression, and falls Referrals and appointments  In addition, I have reviewed and discussed with patient certain preventive protocols, quality metrics, and best practice recommendations. A written personalized care plan for preventive services as well as general preventive health recommendations were provided to patient.     Jordan Hawks Anaisha Mago, CMA   11/09/2022   After Visit Summary: (Mail) Due to this being a telephonic visit, the after visit summary with patients personalized plan was offered to patient via mail   Nurse Notes:

## 2022-11-10 ENCOUNTER — Encounter (HOSPITAL_COMMUNITY): Payer: Medicare Other

## 2022-11-10 ENCOUNTER — Encounter (HOSPITAL_COMMUNITY)
Admission: RE | Admit: 2022-11-10 | Discharge: 2022-11-10 | Disposition: A | Discharge status: Home / Self Care | Payer: Medicare Other | Source: Ambulatory Visit | Attending: Internal Medicine | Admitting: Internal Medicine

## 2022-11-10 ENCOUNTER — Telehealth: Payer: Self-pay | Admitting: *Deleted

## 2022-11-10 DIAGNOSIS — I5022 Chronic systolic (congestive) heart failure: Secondary | ICD-10-CM | POA: Insufficient documentation

## 2022-11-10 NOTE — Progress Notes (Signed)
  Care Coordination   Note   11/10/2022 Name: COURTNEY SHOEN MRN: [id number] DOB: April 19, 1940  KIALEY PRETTYMAN is a 82 y.o. year old female who sees Durwin Nora, Lucina Mellow, MD for primary care. I reached out to [name] by phone today to offer care coordination services.  Ms. Durrah was given information about Care Coordination services today including:   The Care Coordination services include support from the care team which includes your Nurse Coordinator, Clinical Social Worker, or Pharmacist.  The Care Coordination team is here to help remove barriers to the health concerns and goals most important to you. Care Coordination services are voluntary, and the patient may decline or stop services at any time by request to their care team member.   Care Coordination Consent Status: Patient agreed to services and verbal consent obtained.   Follow up plan:  Telephone appointment with care coordination team member scheduled for:  11/21/22  Encounter Outcome:  Patient Scheduled [facility] Coordination Care Guide  Direct Dial: [phone]

## 2022-11-10 NOTE — Progress Notes (Signed)
Daily Session Note  Patient Details  Name: Heather [name] MRN: [id number] Date of Birth: Feb 02, 1941 Referring Provider:   Flowsheet Row CARDIAC REHAB PHASE II ORIENTATION from 09/14/2022 in [facility] CARDIAC REHABILITATION  Referring Provider Luane School MD       Encounter Date: 11/10/2022  Check In:  Session Check In - 11/10/22 0915       Check-In   Supervising physician immediately available to respond to emergencies See telemetry face sheet for immediately available MD    Location [facility]    Staff Present Anielle Headrick Daphine Deutscher, RN, BSN;Jessica Hawkins, MA, RCEP, CCRP, CCET    Virtual Visit No    Medication changes reported     No    Fall or balance concerns reported    No    Tobacco Cessation No Change    Warm-up and Cool-down Performed on first and last piece of equipment    Resistance Training Performed Yes    VAD Patient? No      Pain Assessment   Currently in Pain? No/denies             Capillary Blood Glucose: No results found for this or any previous visit (from the past 24 hour(s)).    Social History   Tobacco Use  Smoking Status Former   Current packs/day: 0.00   Average packs/day: 1 pack/day for 40.0 years (40.0 ttl pk-yrs)   Types: Cigarettes   Start date: 03/20/1945   Quit date: 03/20/1985   Years since quitting: 37.6  Smokeless Tobacco Never    Goals Met:  Independence with exercise equipment Exercise tolerated well No report of concerns or symptoms today Strength training completed today  Goals Unmet:  Not Applicable  Comments: Pt able to follow exercise prescription today without complaint.  Will continue to monitor for progression.    Dr. Dina Rich is Medical Director for [facility] Cardiac Rehab

## 2022-11-13 ENCOUNTER — Encounter (HOSPITAL_COMMUNITY): Payer: Medicare Other

## 2022-11-13 ENCOUNTER — Encounter (HOSPITAL_COMMUNITY)
Admission: RE | Admit: 2022-11-13 | Discharge: 2022-11-13 | Disposition: A | Discharge status: Home / Self Care | Payer: Medicare Other | Source: Ambulatory Visit | Attending: Internal Medicine | Admitting: Internal Medicine

## 2022-11-13 DIAGNOSIS — I5022 Chronic systolic (congestive) heart failure: Secondary | ICD-10-CM

## 2022-11-13 NOTE — Progress Notes (Signed)
Daily Session Note  Patient Details  Name: Heather [name] MRN: [id number] Date of Birth: September 25, 1940 Referring Provider:   Flowsheet Row CARDIAC REHAB PHASE II ORIENTATION from 09/14/2022 in [facility] CARDIAC REHABILITATION  Referring Provider Luane School MD       Encounter Date: 11/13/2022  Check In:  Session Check In - 11/13/22 0915       Check-In   Supervising physician immediately available to respond to emergencies See telemetry face sheet for immediately available MD    Physician(s) Dr. [name]    Location [facility]    Staff Present Ross Ludwig, BS, Exercise Physiologist;Niala Stcharles, RN;Jessica [place], MA, RCEP, CCRP, Dow Adolph, RN, BSN    Virtual Visit No    Medication changes reported     No    Fall or balance concerns reported    No    Tobacco Cessation No Change    Warm-up and Cool-down Performed on first and last piece of equipment    Resistance Training Performed Yes    VAD Patient? No    PAD/SET Patient? No      Pain Assessment   Currently in Pain? No/denies    Pain Score 0-No pain    Multiple Pain Sites No             Capillary Blood Glucose: No results found for this or any previous visit (from the past 24 hour(s)).    Social History   Tobacco Use  Smoking Status Former   Current packs/day: 0.00   Average packs/day: 1 pack/day for 40.0 years (40.0 ttl pk-yrs)   Types: Cigarettes   Start date: 03/20/1945   Quit date: 03/20/1985   Years since quitting: 37.6  Smokeless Tobacco Never    Goals Met:  Independence with exercise equipment Exercise tolerated well No report of concerns or symptoms today  Goals Unmet:  Not Applicable  Comments: Pt able to follow exercise prescription today without complaint.  Will continue to monitor for progression.    Dr. Dina Rich is Medical Director for [facility] Cardiac Rehab

## 2022-11-13 NOTE — Telephone Encounter (Signed)
Called and spoke with the pt and notified of results/recs per Dr. Craige Cotta. She verbalized understanding. Nothing further needed.

## 2022-11-15 ENCOUNTER — Encounter (HOSPITAL_COMMUNITY): Payer: Medicare Other

## 2022-11-15 ENCOUNTER — Encounter (HOSPITAL_COMMUNITY): Payer: Self-pay | Admitting: *Deleted

## 2022-11-15 ENCOUNTER — Encounter (HOSPITAL_COMMUNITY)
Admission: RE | Admit: 2022-11-15 | Discharge: 2022-11-15 | Disposition: A | Discharge status: Home / Self Care | Payer: Medicare Other | Source: Ambulatory Visit | Attending: Internal Medicine

## 2022-11-15 DIAGNOSIS — I5022 Chronic systolic (congestive) heart failure: Secondary | ICD-10-CM | POA: Diagnosis not present

## 2022-11-15 NOTE — Progress Notes (Signed)
Daily Session Note  Patient Details  Name: Heather [name] MRN: [id number] Date of Birth: [birth year] Referring Provider:   Flowsheet Row CARDIAC REHAB PHASE II ORIENTATION from 09/14/2022 in [facility] CARDIAC REHABILITATION  Referring Provider Luane School MD       Encounter Date: 11/15/2022  Check In:  Session Check In - 11/15/22 0915       Check-In   Supervising physician immediately available to respond to emergencies [facility] MD immediately available    Physician(s) Dr. Diona Browner    Location [facility]    Staff Present Ross Ludwig, BS, Exercise Physiologist;Mackinley Cassaday BSN, RN;Daphyne Daphine Deutscher, RN, BSN    Virtual Visit No    Medication changes reported     No    Fall or balance concerns reported    No    Tobacco Cessation No Change    Warm-up and Cool-down Performed on first and last piece of equipment    Resistance Training Performed Yes    VAD Patient? No    PAD/SET Patient? No      Pain Assessment   Currently in Pain? No/denies    Pain Score 0-No pain    Multiple Pain Sites No             Capillary Blood Glucose: No results found for this or any previous visit (from the past 24 hour(s)).    Social History   Tobacco Use  Smoking Status Former   Current packs/day: 0.00   Average packs/day: 1 pack/day for 40.0 years (40.0 ttl pk-yrs)   Types: Cigarettes   Start date: 03/20/1945   Quit date: 03/20/1985   Years since quitting: 37.6  Smokeless Tobacco Never    Goals Met:  Independence with exercise equipment Exercise tolerated well No report of concerns or symptoms today Strength training completed today  Goals Unmet:  Not Applicable  Comments: Marland KitchenMarland KitchenPt able to follow exercise prescription today without complaint.  Will continue to monitor for progression.    Dr. Dina Rich is Medical Director for [facility] Cardiac Rehab

## 2022-11-15 NOTE — Progress Notes (Signed)
Cardiac Individual Treatment Plan  Patient Details  Name: Heather [name] MRN: [id number] Date of Birth: 08-Aug-1940 Referring Provider:   Flowsheet Row CARDIAC REHAB PHASE II ORIENTATION from 09/14/2022 in [facility] CARDIAC REHABILITATION  Referring Provider Luane School MD       Initial Encounter Date:  Flowsheet Row CARDIAC REHAB PHASE II ORIENTATION from 09/14/2022 in [facility] CARDIAC REHABILITATION  Date 09/14/22       Visit Diagnosis: Heart failure, chronic systolic (HCC)  Patient's Home Medications on Admission:  Current Outpatient Medications:    acetaminophen (TYLENOL) 500 MG tablet, Take 500 mg by mouth every 6 (six) hours as needed for mild pain or moderate pain., Disp: , Rfl:    allopurinol (ZYLOPRIM) 300 MG tablet, TAKE 1/2 TABLET BY MOUTH ONCE DAILY, Disp: 45 tablet, Rfl: 0   aspirin (ASPIRIN CHILDRENS) 81 MG chewable tablet, Chew 1 tablet (81 mg total) by mouth daily., Disp: 90 tablet, Rfl: 3   atorvastatin (LIPITOR) 20 MG tablet, Take 1 tablet (20 mg total) by mouth daily., Disp: 90 tablet, Rfl: 3   bisoprolol (ZEBETA) 5 MG tablet, Take 2.5 mg by mouth daily., Disp: , Rfl:    chlorpheniramine (CHLOR-TRIMETON) 4 MG tablet, Take 4 mg by mouth daily as needed for allergies., Disp: , Rfl:    Cholecalciferol (DIALYVITE VITAMIN D 5000 PO), Take 5,000 Units by mouth daily., Disp: , Rfl:    dapagliflozin propanediol (FARXIGA) 10 MG TABS tablet, Take 1 tablet (10 mg total) by mouth daily., Disp: 90 tablet, Rfl: 3   digoxin (LANOXIN) 0.125 MG tablet, Take 0.5 tablets (0.0625 mg total) by mouth daily., Disp: 90 tablet, Rfl: 3   famotidine (PEPCID) 20 MG tablet, Take 1 tablet (20 mg total) by mouth at bedtime. One after supper (Patient taking differently: Take 20 mg by mouth 2 (two) times daily.), Disp: 30 tablet, Rfl: 3   Fluticasone-Umeclidin-Vilant (TRELEGY ELLIPTA) 200-62.5-25 MCG/ACT AEPB, Inhale 1 puff into the lungs daily., Disp: 1 each, Rfl: 0   furosemide (LASIX)  40 MG tablet, Take 60 mg by mouth daily., Disp: , Rfl:    hydrALAZINE (APRESOLINE) 25 MG tablet, Take 0.5 tablets (12.5 mg total) by mouth 3 (three) times daily., Disp: 90 tablet, Rfl: 3   HYDROcodone-acetaminophen (NORCO/VICODIN) 5-325 MG tablet, Take 1 tablet by mouth every 6 (six) hours as needed for moderate pain., Disp: 120 tablet, Rfl: 0   isosorbide mononitrate (IMDUR) 30 MG 24 hr tablet, Take 0.5 tablets (15 mg total) by mouth daily., Disp: 45 tablet, Rfl: 3   ketoconazole (NIZORAL) 2 % cream, Apply 1 Application topically as needed for irritation., Disp: , Rfl:    losartan (COZAAR) 25 MG tablet, Take 1 tablet (25 mg total) by mouth daily., Disp: 90 tablet, Rfl: 3   Multiple Vitamins-Minerals (MULTIVITAMIN WITH MINERALS) tablet, Take 1 tablet by mouth daily., Disp: , Rfl:    PROAIR HFA 108 (90 Base) MCG/ACT inhaler, Inhale 2 puffs into the lungs every 6 (six) hours as needed for wheezing or shortness of breath., Disp: 18 g, Rfl: 2   Propylene Glycol (SYSTANE COMPLETE) 0.6 % SOLN, Place 1 drop into both eyes 2 (two) times daily., Disp: , Rfl:    spironolactone (ALDACTONE) 25 MG tablet, Take 1 tablet (25 mg total) by mouth daily., Disp: 30 tablet, Rfl: 5   venlafaxine XR (EFFEXOR-XR) 75 MG 24 hr capsule, Take 1 capsule (75 mg total) by mouth See admin instructions., Disp: 30 capsule, Rfl: 5  Past Medical History: Past Medical History:  Diagnosis Date   Anemia    Anxiety    Arthritis    COPD (chronic obstructive pulmonary disease) (HCC)    CVA (cerebral vascular accident) (HCC)    Depression    Dry eyes 04/14/2014   GERD (gastroesophageal reflux disease)    Glaucoma    Gout    HTN (hypertension)    OSA (obstructive sleep apnea)     Tobacco Use: Social History   Tobacco Use  Smoking Status Former   Current packs/day: 0.00   Average packs/day: 1 pack/day for 40.0 years (40.0 ttl pk-yrs)   Types: Cigarettes   Start date: 03/20/1945   Quit date: 03/20/1985   Years since  quitting: 37.6  Smokeless Tobacco Never    Labs: Review Flowsheet  More data exists      Latest Ref Rng & Units 03/18/2022 03/19/2022 03/20/2022 05/26/2022 08/17/2022  Labs for ITP Cardiac and Pulmonary Rehab  Cholestrol 0 - 200 mg/dL - - - 161  096   LDL (calc) 0 - 99 mg/dL - - - 82  81   HDL-C >04 mg/dL - - - 65  82   Trlycerides <150 mg/dL - - - 58  45   Hemoglobin A1c 4.8 - 5.6 % - - - 6.5  -  O2 Saturation % 78.3  72.2  59.1  - -    Details            Capillary Blood Glucose: Lab Results  Component Value Date   GLUCAP 73 09/29/2022   GLUCAP 129 (H) 09/29/2022   GLUCAP 79 09/25/2022   GLUCAP 100 (H) 09/25/2022   GLUCAP 83 09/18/2022     Exercise Target Goals: Exercise Program Goal: Individual exercise prescription set using results from initial 6 min walk test and THRR while considering  patient's activity barriers and safety.   Exercise Prescription Goal: Starting with aerobic activity 30 plus minutes a day, 3 days per week for initial exercise prescription. Provide home exercise prescription and guidelines that participant acknowledges understanding prior to discharge.  Activity Barriers & Risk Stratification:  Activity Barriers & Cardiac Risk Stratification - 09/14/22 1236       Activity Barriers & Cardiac Risk Stratification   Activity Barriers Left Hip Replacement;Right Hip Replacement;Right Knee Replacement;Joint Problems;Assistive Device;History of Falls;Balance Concerns   L shoulder replacement   Cardiac Risk Stratification High             6 Minute Walk:  6 Minute Walk     Row Name 09/14/22 1402         6 Minute Walk   Phase Initial     Distance 828 feet     Walk Time 6 minutes     # of Rest Breaks 0     MPH 1.57     METS 1.54     RPE 11     VO2 Peak 5.3     Symptoms Yes (comment)     Comments R hip pain 5/10     Resting HR 63 bpm     Resting BP 122/62     Resting Oxygen Saturation  99 %     Exercise Oxygen Saturation  during 6 min  walk 96 %     Max Ex. HR 89 bpm     Max Ex. BP 134/72     2 Minute Post BP 124/70              Oxygen Initial Assessment:   Oxygen Re-Evaluation:   Oxygen  Discharge (Final Oxygen Re-Evaluation):   Initial Exercise Prescription:  Initial Exercise Prescription - 09/14/22 1400       Date of Initial Exercise RX and Referring Provider   Date 09/14/22    Referring Provider Luane School MD      Oxygen   Maintain Oxygen Saturation 88% or higher      Treadmill   MPH 1.3    Grade 0.5    Minutes 15    METs 2.08      NuStep   Level 1    SPM 80    Minutes 15    METs 2      Prescription Details   Frequency (times per week) 3    Duration Progress to 30 minutes of continuous aerobic without signs/symptoms of physical distress      Intensity   THRR 40-80% of Max Heartrate 94-124    Ratings of Perceived Exertion 11-13    Perceived Dyspnea 0-4      Progression   Progression Continue to progress workloads to maintain intensity without signs/symptoms of physical distress.      Resistance Training   Training Prescription Yes    Weight 2 lb    Reps 10-15             Perform Capillary Blood Glucose checks as needed.  Exercise Prescription Changes:   Exercise Prescription Changes     Row Name 09/14/22 1400 09/18/22 1300 10/02/22 1200 10/20/22 1000 10/27/22 0900     Response to Exercise   Blood Pressure (Admit) 122/62 124/62 100/58 100/60 --   Blood Pressure (Exercise) 134/72 120/64 -- -- --   Blood Pressure (Exit) 124/70 98/50 90/40  94/50 --   Heart Rate (Admit) 653 bpm 71 bpm 72 bpm -- --   Heart Rate (Exercise) 89 bpm 90 bpm 93 bpm 84 bpm --   Heart Rate (Exit) 69 bpm 68 bpm 75 bpm 66 bpm --   Oxygen Saturation (Admit) 99 % -- -- -- --   Oxygen Saturation (Exercise) 96 % -- -- -- --   Rating of Perceived Exertion (Exercise) 11 13 13 12  --   Symptoms R hip pain 5/10 -- -- -- --   Comments walk test results -- -- -- --   Duration -- Continue with 45  min of aerobic exercise without signs/symptoms of physical distress. Continue with 30 min of aerobic exercise without signs/symptoms of physical distress. Continue with 30 min of aerobic exercise without signs/symptoms of physical distress. --   Intensity -- THRR unchanged THRR unchanged THRR unchanged --     Progression   Progression -- Continue to progress workloads to maintain intensity without signs/symptoms of physical distress. Continue to progress workloads to maintain intensity without signs/symptoms of physical distress. Continue to progress workloads to maintain intensity without signs/symptoms of physical distress. --     Paramedic Prescription -- Yes Yes Yes --   Weight -- 2 3 4  --   Reps -- 10-15 10-15 10-15 --     Treadmill   MPH -- 1 1.3 -- --   Grade -- 0 0 -- --   Minutes -- 15 15 -- --   METs -- 1.77 1.99 -- --     NuStep   Level -- 1 2 2  --   SPM -- 55 67 69 --   Minutes -- 15 15 15  --   METs -- 1.6 1.7 1.8 --     Track   Laps -- -- --  20 --   Minutes -- -- -- 15 --     Home Exercise Plan   Plans to continue exercise at -- -- -- -- Home (comment)  walking, recumbent bike   Frequency -- -- -- -- Add 2 additional days to program exercise sessions.   Initial Home Exercises Provided -- -- -- -- 10/27/22     Oxygen   Maintain Oxygen Saturation -- 88% or higher 88% or higher 88% or higher --    Row Name 11/13/22 1300             Response to Exercise   Blood Pressure (Admit) 110/58       Blood Pressure (Exit) 102/60       Heart Rate (Admit) 65 bpm       Heart Rate (Exercise) 90 bpm       Heart Rate (Exit) 74 bpm       Rating of Perceived Exertion (Exercise) 13       Duration Continue with 30 min of aerobic exercise without signs/symptoms of physical distress.       Intensity THRR unchanged         Progression   Progression Continue to progress workloads to maintain intensity without signs/symptoms of physical distress.          Resistance Training   Training Prescription Yes       Weight 3       Reps 10-15         NuStep   Level 2       SPM 80       Minutes 15       METs 1.9         Track   Laps 26       Minutes 15         Home Exercise Plan   Plans to continue exercise at Home (comment)       Frequency Add 2 additional days to program exercise sessions.         Oxygen   Maintain Oxygen Saturation 88% or higher                Exercise Comments:   Exercise Comments     Row Name 09/18/22 1000           Exercise Comments First full day of exercise!  Patient was oriented to gym and equipment including functions, settings, policies, and procedures.  Patient's individual exercise prescription and treatment plan were reviewed.  All starting workloads were established based on the results of the 6 minute walk test done at initial orientation visit.  The plan for exercise progression was also introduced and progression will be customized based on patient's performance and goals.                Exercise Goals and Review:   Exercise Goals     Row Name 09/14/22 1405 09/18/22 1323           Exercise Goals   Increase Physical Activity Yes Yes      Intervention Provide advice, education, support and counseling about physical activity/exercise needs.;Develop an individualized exercise prescription for aerobic and resistive training based on initial evaluation findings, risk stratification, comorbidities and participant's personal goals. Provide advice, education, support and counseling about physical activity/exercise needs.;Develop an individualized exercise prescription for aerobic and resistive training based on initial evaluation findings, risk stratification, comorbidities and participant's personal goals.      Expected Outcomes Short Term: Attend rehab  on a regular basis to increase amount of physical activity.;Long Term: Exercising regularly at least 3-5 days a week.;Long Term: Add in home  exercise to make exercise part of routine and to increase amount of physical activity. Short Term: Attend rehab on a regular basis to increase amount of physical activity.;Long Term: Exercising regularly at least 3-5 days a week.;Long Term: Add in home exercise to make exercise part of routine and to increase amount of physical activity.      Increase Strength and Stamina Yes Yes      Intervention Provide advice, education, support and counseling about physical activity/exercise needs.;Develop an individualized exercise prescription for aerobic and resistive training based on initial evaluation findings, risk stratification, comorbidities and participant's personal goals. Provide advice, education, support and counseling about physical activity/exercise needs.;Develop an individualized exercise prescription for aerobic and resistive training based on initial evaluation findings, risk stratification, comorbidities and participant's personal goals.      Expected Outcomes Short Term: Increase workloads from initial exercise prescription for resistance, speed, and METs.;Short Term: Perform resistance training exercises routinely during rehab and add in resistance training at home;Long Term: Improve cardiorespiratory fitness, muscular endurance and strength as measured by increased METs and functional capacity ( ) Short Term: Increase workloads from initial exercise prescription for resistance, speed, and METs.;Short Term: Perform resistance training exercises routinely during rehab and add in resistance training at home;Long Term: Improve cardiorespiratory fitness, muscular endurance and strength as measured by increased METs and functional capacity ( )      Able to understand and use rate of perceived exertion (RPE) scale Yes Yes      Intervention Provide education and explanation on how to use RPE scale Provide education and explanation on how to use RPE scale      Expected Outcomes Short Term: Able to use  RPE daily in rehab to express subjective intensity level;Long Term:  Able to use RPE to guide intensity level when exercising independently Short Term: Able to use RPE daily in rehab to express subjective intensity level;Long Term:  Able to use RPE to guide intensity level when exercising independently      Able to understand and use Dyspnea scale Yes Yes      Intervention Provide education and explanation on how to use Dyspnea scale Provide education and explanation on how to use Dyspnea scale      Expected Outcomes Short Term: Able to use Dyspnea scale daily in rehab to express subjective sense of shortness of breath during exertion;Long Term: Able to use Dyspnea scale to guide intensity level when exercising independently Short Term: Able to use Dyspnea scale daily in rehab to express subjective sense of shortness of breath during exertion;Long Term: Able to use Dyspnea scale to guide intensity level when exercising independently      Knowledge and understanding of Target Heart Rate Range (THRR) Yes Yes      Intervention Provide education and explanation of THRR including how the numbers were predicted and where they are located for reference Provide education and explanation of THRR including how the numbers were predicted and where they are located for reference      Expected Outcomes Short Term: Able to state/look up THRR;Short Term: Able to use daily as guideline for intensity in rehab;Long Term: Able to use THRR to govern intensity when exercising independently Short Term: Able to state/look up THRR;Short Term: Able to use daily as guideline for intensity in rehab;Long Term: Able to use THRR to govern intensity when exercising independently  Able to check pulse independently Yes Yes      Intervention Provide education and demonstration on how to check pulse in carotid and radial arteries.;Review the importance of being able to check your own pulse for safety during independent exercise Provide  education and demonstration on how to check pulse in carotid and radial arteries.;Review the importance of being able to check your own pulse for safety during independent exercise      Expected Outcomes Long Term: Able to check pulse independently and accurately;Short Term: Able to explain why pulse checking is important during independent exercise Long Term: Able to check pulse independently and accurately;Short Term: Able to explain why pulse checking is important during independent exercise      Understanding of Exercise Prescription Yes Yes      Intervention Provide education, explanation, and written materials on patient's individual exercise prescription Provide education, explanation, and written materials on patient's individual exercise prescription      Expected Outcomes Short Term: Able to explain program exercise prescription;Long Term: Able to explain home exercise prescription to exercise independently Short Term: Able to explain program exercise prescription;Long Term: Able to explain home exercise prescription to exercise independently               Exercise Goals Re-Evaluation :  Exercise Goals Re-Evaluation     Row Name 09/14/22 1405 09/18/22 1323 10/03/22 0814 10/03/22 0815 10/04/22 0936     Exercise Goal Re-Evaluation   Exercise Goals Review Understanding of Exercise Prescription;Able to understand and use Dyspnea scale;Able to understand and use rate of perceived exertion (RPE) scale Increase Physical Activity;Increase Strength and Stamina;Able to understand and use rate of perceived exertion (RPE) scale;Knowledge and understanding of Target Heart Rate Range (THRR);Able to check pulse independently;Understanding of Exercise Prescription -- -- Increase Physical Activity;Increase Strength and Stamina;Understanding of Exercise Prescription   Comments Reviewed RPE  and dyspnea scale and program prescription with pt today.  Pt voiced understanding and was given a copy of goals to  take home. Pt has completed her first day in cardiac rehab. She did well for her first day in class learing the equipment. She is slightly deconditioned and started out slower on the treadmill and stepper. Informend the pt about achieving 80 SPM on the stepper for her first week. She is currently exercising at 1.77 METs on the treadmill. Will continue to monitor and progress as able, Pt is taking the first Pt is on 6th visit and has increased her level on the stepper and speed on the treadmill Pt feels like she has gotten a little stronger since starting rehab.  She is currently on 1.4 speed on the treadmill.  The pt has started using her exercise bike at home.  She exercised for 10 min on her bike yesterday, and she stated that she plans to increase this to a few days a week for 15 min each time.   Expected Outcomes Short: Use RPE daily to regulate intensity.  Long: Follow program prescription Through exercise at rehab and home, patient wll achieve their goals, -- -- Short term:  To go over home exercise goals with pt  Long term:  Pt will continue exercising at home a few days a week    Row Name 10/20/22 1028 10/27/22 0948           Exercise Goal Re-Evaluation   Exercise Goals Review Increase Physical Activity;Increase Strength and Stamina;Understanding of Exercise Prescription Increase Physical Activity;Increase Strength and Stamina;Understanding of Exercise Prescription  Comments Pt has not increased her level on the stepper in the last two week. She has been walking the track instead of the treadmill and has increased her laps each class. She is at 20 laps. Will continue to monitor and progress as able . Latashia is doing well in rehab.  She is not exercising at home.  She stays busy doing her chores and ADLs, but has not gotten on her bike yet.  Reviewed home exercise with pt today.  Pt plans to walk and use recumbent bike at home for exercise.  Reviewed THR, RPD, RPE, sign and symptoms, pulse  oximetery and when to call 911 or MD.  Also discussed weather considerations and indoor options.  Pt voiced understanding.  She notes hip pain on rehab days when she walks.  We talked about using ice to help with pain managment.      Expected Outcomes Short term: Short: Start to walk or ride bike at home on off days Long: conitnue to improve stamina                Discharge Exercise Prescription (Final Exercise Prescription Changes):  Exercise Prescription Changes - 11/13/22 1300       Response to Exercise   Blood Pressure (Admit) 110/58    Blood Pressure (Exit) 102/60    Heart Rate (Admit) 65 bpm    Heart Rate (Exercise) 90 bpm    Heart Rate (Exit) 74 bpm    Rating of Perceived Exertion (Exercise) 13    Duration Continue with 30 min of aerobic exercise without signs/symptoms of physical distress.    Intensity THRR unchanged      Progression   Progression Continue to progress workloads to maintain intensity without signs/symptoms of physical distress.      Resistance Training   Training Prescription Yes    Weight 3    Reps 10-15      NuStep   Level 2    SPM 80    Minutes 15    METs 1.9      Track   Laps 26    Minutes 15      Home Exercise Plan   Plans to continue exercise at Home (comment)    Frequency Add 2 additional days to program exercise sessions.      Oxygen   Maintain Oxygen Saturation 88% or higher             Nutrition:  Target Goals: Understanding of nutrition guidelines, daily intake of sodium 1500mg , cholesterol 200mg , calories 30% from fat and 7% or less from saturated fats, daily to have 5 or more servings of fruits and vegetables.  Biometrics:  Pre Biometrics - 09/14/22 1406       Pre Biometrics   Height 5' 2.5" (1.588 m)    Weight 135 lb 14.4 oz (61.6 kg)    Waist Circumference 28 inches    Hip Circumference 34 inches    Waist to Hip Ratio 0.82 %    BMI (Calculated) 24.44    Triceps Skinfold 14 mm    % Body Fat 32.2 %    Grip  Strength 17.1 kg    Single Leg Stand 5 seconds              Nutrition Therapy Plan and Nutrition Goals:  Nutrition Therapy & Goals - 09/14/22 1412       Intervention Plan   Intervention Prescribe, educate and counsel regarding individualized specific dietary modifications aiming towards targeted core components  such as weight, hypertension, lipid management, diabetes, heart failure and other comorbidities.;Nutrition handout(s) given to patient.    Expected Outcomes Short Term Goal: Understand basic principles of dietary content, such as calories, fat, sodium, cholesterol and nutrients.             Nutrition Assessments:  Nutrition Assessments - 09/14/22 1412       MEDFICTS Scores   Pre Score 49            MEDIFICTS Score Key: >=70 Need to make dietary changes  40-70 Heart Healthy Diet <= 40 Therapeutic Level Cholesterol Diet   Picture Your Plate Scores: <09 Unhealthy dietary pattern with much room for improvement. 41-50 Dietary pattern unlikely to meet recommendations for good health and room for improvement. 51-60 More healthful dietary pattern, with some room for improvement.  >60 Healthy dietary pattern, although there may be some specific behaviors that could be improved.    Nutrition Goals Re-Evaluation:  Nutrition Goals Re-Evaluation     Row Name 10/04/22 0948 10/27/22 0955           Goals   Current Weight 138 lb 14.4 oz (63 kg) --      Nutrition Goal Heart healthy diet Short term: Pt will cut back on her sugar intake Long term: Focus on heart healthy eating      Comment Pt tries to stay away from salty foods; however, she likes sweets and loves to bake.  She states that she has gained a few pounds and knows that she has to change her diet. Dariene is doing well in rehab.  She is doing well with her diet.  She is still staying away from sugar and salt.      Expected Outcome Short term:  Pt will cut back on her sugar intake  Long term:  Focus on heart  healthy eating Short: Continue to cut back on sugar Long: Continue to add in fruits and vegetables               Nutrition Goals Discharge (Final Nutrition Goals Re-Evaluation):  Nutrition Goals Re-Evaluation - 10/27/22 0955       Goals   Nutrition Goal Short term: Pt will cut back on her sugar intake Long term: Focus on heart healthy eating    Comment Phillip is doing well in rehab.  She is doing well with her diet.  She is still staying away from sugar and salt.    Expected Outcome Short: Continue to cut back on sugar Long: Continue to add in fruits and vegetables             Psychosocial: Target Goals: Acknowledge presence or absence of significant depression and/or stress, maximize coping skills, provide positive support system. Participant is able to verbalize types and ability to use techniques and skills needed for reducing stress and depression.  Initial Review & Psychosocial Screening:  Initial Psych Review & Screening - 09/14/22 1239       Initial Review   Current issues with Current Psychotropic Meds;History of Depression;Current Stress Concerns    Source of Stress Concerns Financial;Unable to participate in former interests or hobbies;Unable to perform yard/household activities    Comments letter from IRS about 2023 tax return not being filed, she is taking effexor for depression and feeling well managed currently, sleeps well uses Sleepy Time Tea, heart failure has slowed her down wants to get back to her normal      Family Dynamics   Good Support System? Yes  has a friend she relies on, 3 children (2 sons in [place], daughter in [place]) they call often and sons come to check in     Barriers   Psychosocial barriers to participate in program Psychosocial barriers identified (see note);The patient should benefit from training in stress management and relaxation.      Screening Interventions   Interventions Encouraged to exercise;To provide support and resources with  identified psychosocial needs;Provide feedback about the scores to participant    Expected Outcomes Short Term goal: Utilizing psychosocial counselor, staff and physician to assist with identification of specific Stressors or current issues interfering with healing process. Setting desired goal for each stressor or current issue identified.;Long Term Goal: Stressors or current issues are controlled or eliminated.;Short Term goal: Identification and review with participant of any Quality of Life or Depression concerns found by scoring the questionnaire.;Long Term goal: The participant improves quality of Life and PHQ9 Scores as seen by post scores and/or verbalization of changes             Quality of Life Scores:  Quality of Life - 09/14/22 1411       Quality of Life   Select Quality of Life      Quality of Life Scores   Health/Function Pre 25 %    Socioeconomic Pre 24.08 %    Psych/Spiritual Pre 28 %    Family Pre 20.75 %    GLOBAL Pre 24.88 %            Scores of 19 and below usually indicate a poorer quality of life in these areas.  A difference of  2-3 points is a clinically meaningful difference.  A difference of 2-3 points in the total score of the Quality of Life Index has been associated with significant improvement in overall quality of life, self-image, physical symptoms, and general health in studies assessing change in quality of life.  PHQ-9: Review Flowsheet  More data exists      11/09/2022 10/19/2022 09/14/2022 08/31/2022 05/26/2022  Depression screen PHQ 2/9  Decreased Interest 0 0 0 0 0 0  Down, Depressed, Hopeless 0 0 0 0 0 0  PHQ - 2 Score 0 0 0 0 0 0  Altered sleeping - - 0 - 2  Tired, decreased energy - - 1 - 0  Change in appetite - - 1 - 2  Feeling bad or failure about yourself  - - 0 - 0  Trouble concentrating - - 0 - 0  Moving slowly or fidgety/restless - - 0 - 1  Suicidal thoughts - - 0 - 0  PHQ-9 Score - - 2 - 5  Difficult doing work/chores - - Not  difficult at all - -    Details       Multiple values from one day are sorted in reverse-chronological order        Interpretation of Total Score  Total Score Depression Severity:  1-4 = Minimal depression, 5-9 = Mild depression, 10-14 = Moderate depression, 15-19 = Moderately severe depression, 20-27 = Severe depression   Psychosocial Evaluation and Intervention:  Psychosocial Evaluation - 09/14/22 1407       Psychosocial Evaluation & Interventions   Interventions Encouraged to exercise with the program and follow exercise prescription    Comments Mayreli is coming into cardiac rehab for heart failure.  She has had two hospitalizations from heart failure over the past year.  She has not been able to go and do and exercise like she  used to and would like to rebuild her strength and stamina.  She wants to get back to her normal again.  She lives alone, but has a "friend" and they check on each other frequently.  She has two sons in [place] that call regularly and come by to check in from time to time.  She also has a daughter in [place] that calls at least weekly.  She usually sleeps well using her Sleepy time tea at night.  Her biggest stressor other than her health is trying to figure her finances as she did not file taxes for 2023 thinking she didn't need to and got a letter telling her otherwise.  Overall she is a positive and happy person.  She does have a history of depression but PHQ showed no current issues. She take effexor for her depression and is feeling well managed at this time.  She is excited to start program and does not preceive any  barriers to attending rehab.    Expected Outcomes Short: Attend rehab to build stamina Long: Be able to go and do as she wants to do again    Continue Psychosocial Services  Follow up required by staff             Psychosocial Re-Evaluation:  Psychosocial Re-Evaluation     Row Name 10/04/22 0940 10/27/22 0953            Psychosocial Re-Evaluation   Current issues with Current Stress Concerns;Current Psychotropic Meds Current Stress Concerns;Current Psychotropic Meds;Current Sleep Concerns      Comments Pt states her depression is being well managed with Effexor.  She has tried Sleepy Time Tea to help her sleep; however, she went to [employer] and purchased Melatonin which is working better for her.  Pt states she has no stressors at home at this time. Diannie is doing well mentally.  She is feeling good overall.  She has been sleeping better with the tea. She feels more rested with her better sleep now.      Expected Outcomes Short term:  Pt's depression will continue to be managed with Effexor Long term:  Pt will have effective stress management Short: Exercise for mental boost Long: conitnue to use tea to help with sleep      Interventions Encouraged to attend Cardiac Rehabilitation for the exercise;Stress management education;Relaxation education Encouraged to attend Cardiac Rehabilitation for the exercise      Continue Psychosocial Services  Follow up required by staff Follow up required by staff        Initial Review   Source of Stress Concerns Financial;Unable to participate in former interests or hobbies;Unable to perform yard/household activities --               Psychosocial Discharge (Final Psychosocial Re-Evaluation):  Psychosocial Re-Evaluation - 10/27/22 0953       Psychosocial Re-Evaluation   Current issues with Current Stress Concerns;Current Psychotropic Meds;Current Sleep Concerns    Comments Diannie is doing well mentally.  She is feeling good overall.  She has been sleeping better with the tea. She feels more rested with her better sleep now.    Expected Outcomes Short: Exercise for mental boost Long: conitnue to use tea to help with sleep    Interventions Encouraged to attend Cardiac Rehabilitation for the exercise    Continue Psychosocial Services  Follow up required by staff              Vocational Rehabilitation: Provide vocational rehab assistance to qualifying  candidates.   Vocational Rehab Evaluation & Intervention:  Vocational Rehab - 09/14/22 1235       Initial Vocational Rehab Evaluation & Intervention   Assessment shows need for Vocational Rehabilitation No   retired            Education: Education Goals: Education classes will be provided on a weekly basis, covering required topics. Participant will state understanding/return demonstration of topics presented.  Learning Barriers/Preferences:  Learning Barriers/Preferences - 09/14/22 1234       Learning Barriers/Preferences   Learning Barriers Sight   glasses for reading   Learning Preferences Skilled Demonstration;Written Material             Education Topics: Hypertension, Hypertension Reduction -Define heart disease and high blood pressure. Discus how high blood pressure affects the body and ways to reduce high blood pressure.   Exercise and Your Heart -Discuss why it is important to exercise, the FITT principles of exercise, normal and abnormal responses to exercise, and how to exercise safely.   Angina -Discuss definition of angina, causes of angina, treatment of angina, and how to decrease risk of having angina. Flowsheet Row CARDIAC REHAB PHASE II EXERCISE from 11/01/2022 in [place] CARDIAC REHABILITATION  Date 09/20/22  Educator [facility]  Instruction Review Code 1- Verbalizes Understanding       Cardiac Medications -Review what the following cardiac medications are used for, how they affect the body, and side effects that may occur when taking the medications.  Medications include Aspirin, Beta blockers, calcium channel blockers, ACE Inhibitors, angiotensin receptor blockers, diuretics, digoxin, and antihyperlipidemics. Flowsheet Row CARDIAC REHAB PHASE II EXERCISE from 11/01/2022 in [place] CARDIAC REHABILITATION  Date 11/01/22  Educator [facility]  Instruction Review Code 1-  Verbalizes Understanding       Congestive Heart Failure -Discuss the definition of CHF, how to live with CHF, the signs and symptoms of CHF, and how keep track of weight and sodium intake.   Heart Disease and Intimacy -Discus the effect sexual activity has on the heart, how changes occur during intimacy as we age, and safety during sexual activity. Flowsheet Row CARDIAC REHAB PHASE II EXERCISE from 11/01/2022 in [place] CARDIAC REHABILITATION  Date 10/11/22  Educator [facility]  Instruction Review Code 1- Verbalizes Understanding       Smoking Cessation / COPD -Discuss different methods to quit smoking, the health benefits of quitting smoking, and the definition of COPD.   Nutrition I: Fats -Discuss the types of cholesterol, what cholesterol does to the heart, and how cholesterol levels can be controlled. Flowsheet Row CARDIAC REHAB PHASE II EXERCISE from 11/01/2022 in [place] CARDIAC REHABILITATION  Date 10/18/22  Educator [facility]  Instruction Review Code 1- Verbalizes Understanding       Nutrition II: Labels -Discuss the different components of food labels and how to read food label Flowsheet Row CARDIAC REHAB PHASE II EXERCISE from 11/01/2022 in [place] [employer] [place] CARDIAC REHABILITATION  Date 10/18/22  Educator [facility]  Instruction Review Code 1- Verbalizes Understanding       Heart Parts/Heart Disease and PAD -Discuss the anatomy of the heart, the pathway of blood circulation through the heart, and these are affected by heart disease.   Stress I: Signs and Symptoms -Discuss the causes of stress, how stress may lead to anxiety and depression, and ways to limit stress. Flowsheet Row CARDIAC REHAB PHASE II EXERCISE from 11/01/2022 in [place] CARDIAC REHABILITATION  Date 10/25/22  Educator [facility]  Instruction Review Code 1- Verbalizes Understanding  Stress II: Relaxation -Discuss different types of relaxation techniques to limit stress. Flowsheet Row CARDIAC REHAB PHASE II  EXERCISE from 11/01/2022 in [place] CARDIAC REHABILITATION  Date 10/25/22  Educator [facility]  Instruction Review Code 1- Verbalizes Understanding       Warning Signs of Stroke / TIA -Discuss definition of a stroke, what the signs and symptoms are of a stroke, and how to identify when someone is having stroke.   Knowledge Questionnaire Score:  Knowledge Questionnaire Score - 09/14/22 1412       Knowledge Questionnaire Score   Pre Score 19/24             Core Components/Risk Factors/Patient Goals at Admission:  Personal Goals and Risk Factors at Admission - 09/14/22 1412       Core Components/Risk Factors/Patient Goals on Admission    Weight Management Yes;Weight Maintenance    Intervention Weight Management: Develop a combined nutrition and exercise program designed to reach desired caloric intake, while maintaining appropriate intake of nutrient and fiber, sodium and fats, and appropriate energy expenditure required for the weight goal.;Weight Management: Provide education and appropriate resources to help participant work on and attain dietary goals.    Admit Weight 135 lb 14.4 oz (61.6 kg)    Goal Weight: Short Term 135 lb (61.2 kg)    Goal Weight: Long Term 135 lb (61.2 kg)    Expected Outcomes Short Term: Continue to assess and modify interventions until short term weight is achieved;Long Term: Adherence to nutrition and physical activity/exercise program aimed toward attainment of established weight goal;Weight Maintenance: Understanding of the daily nutrition guidelines, which includes 25-35% calories from fat, 7% or less cal from saturated fats, less than 200mg  cholesterol, less than 1.5gm of sodium, & 5 or more servings of fruits and vegetables daily    Diabetes Yes    Intervention Provide education about signs/symptoms and action to take for hypo/hyperglycemia.;Provide education about proper nutrition, including hydration, and aerobic/resistive exercise prescription along  with prescribed medications to achieve blood glucose in normal ranges: Fasting glucose 65-99 mg/dL    Expected Outcomes Short Term: Participant verbalizes understanding of the signs/symptoms and immediate care of hyper/hypoglycemia, proper foot care and importance of medication, aerobic/resistive exercise and nutrition plan for blood glucose control.;Long Term: Attainment of HbA1C < 7%.    Heart Failure Yes    Intervention Provide a combined exercise and nutrition program that is supplemented with education, support and counseling about heart failure. Directed toward relieving symptoms such as shortness of breath, decreased exercise tolerance, and extremity edema.    Expected Outcomes Improve functional capacity of life;Short term: Attendance in program 2-3 days a week with increased exercise capacity. Reported lower sodium intake. Reported increased fruit and vegetable intake. Reports medication compliance.;Short term: Daily weights obtained and reported for increase. Utilizing diuretic protocols set by physician.;Long term: Adoption of self-care skills and reduction of barriers for early signs and symptoms recognition and intervention leading to self-care maintenance.    Hypertension Yes    Intervention Provide education on lifestyle modifcations including regular physical activity/exercise, weight management, moderate sodium restriction and increased consumption of fresh fruit, vegetables, and low fat dairy, alcohol moderation, and smoking cessation.;Monitor prescription use compliance.    Expected Outcomes Short Term: Continued assessment and intervention until BP is < 140/49mm HG in hypertensive participants. < 130/27mm HG in hypertensive participants with diabetes, heart failure or chronic kidney disease.;Long Term: Maintenance of blood pressure at goal levels.    Lipids Yes    Intervention  Provide education and support for participant on nutrition & aerobic/resistive exercise along with prescribed  medications to achieve LDL 70mg , HDL >40mg .    Expected Outcomes Short Term: Participant states understanding of desired cholesterol values and is compliant with medications prescribed. Participant is following exercise prescription and nutrition guidelines.;Long Term: Cholesterol controlled with medications as prescribed, with individualized exercise RX and with personalized nutrition plan. Value goals: LDL < 70mg , HDL > 40 mg.             Core Components/Risk Factors/Patient Goals Review:   Goals and Risk Factor Review     Row Name 10/04/22 0956 10/27/22 0957           Core Components/Risk Factors/Patient Goals Review   Personal Goals Review Weight Management/Obesity;Heart Failure;Lipids;Diabetes;Hypertension Weight Management/Obesity;Heart Failure;Lipids;Diabetes;Hypertension      Review Pt states that she does not check her blood sugar at home and does not have a meter.  I spoke to the pt about asking her primary doctor for a prescription for a freestyle meter so that she can check her blood sugar daily.  She seemed very willing to ask her doctor about this so that she can monitor her blood sugar.  She does check her BP 2-3 times a week and weighs daily. Dianara is doing well in rehab.  Her weight is staying steady and she is feeling good overall.  She had an xray for her back and hip pain, but doctor could not tell what was causing her pain.  She has not been checking her sugars and not sure if she still needs to.  She is still taking farxiga, but it seems to be doing well. She has not had any heart failure symptoms recently. Her pressures are doing well overall.      Expected Outcomes Short term:  Pt will have asked her doctor for a prescription for a freestyle meter  Long term:  Pt will be more proactive with her diabetes Short: Continue to montior heart failure symptoms Long; Continue to monitor risk factors.               Core Components/Risk Factors/Patient Goals at Discharge  (Final Review):   Goals and Risk Factor Review - 10/27/22 0957       Core Components/Risk Factors/Patient Goals Review   Personal Goals Review Weight Management/Obesity;Heart Failure;Lipids;Diabetes;Hypertension    Review Prestina is doing well in rehab.  Her weight is staying steady and she is feeling good overall.  She had an xray for her back and hip pain, but doctor could not tell what was causing her pain.  She has not been checking her sugars and not sure if she still needs to.  She is still taking farxiga, but it seems to be doing well. She has not had any heart failure symptoms recently. Her pressures are doing well overall.    Expected Outcomes Short: Continue to montior heart failure symptoms Long; Continue to monitor risk factors.             ITP Comments:  ITP Comments     Row Name 09/14/22 1401 09/18/22 1000 09/20/22 0843 10/09/22 1127 10/13/22 0720   ITP Comments Patient attend orientation today.  Patient is attendingCardiac Rehabilitation Program.  [facility].  Reviewed medical chart, RPE, gym safety, and program guidelines.  Patient was fitted to equipment they will be using during rehab.  Patient is scheduled to start exercise on 09/18/22. First full day of exercise!  Patient was oriented to gym and equipment including functions, settings, policies, and procedures.  Patient's individual exercise prescription and treatment plan were reviewed.  All starting workloads were established based on the results of the 6 minute walk test done at initial orientation visit.  The plan for exercise progression was also introduced and progression will be customized based on patient's performance and goals. 30 day review completed. ITP sent to Dr. Dina Rich, Medical Director of Cardiac Rehab. Continue with ITP unless changes are made by physician.  New to program. Sent Dr Corinna Lines a note regarding low blood pressure with complaints of  dizziness and not feeling right in her head at home occasionally. Dr. Jenene Slicker forwarded note to HF clinic. Brynda Peon, NP decreased her Hydralazine from 25 mg to 12.5 mg TID and decreased her Imdur from 30 mg to 15 mg daily. We will continue to montior her blood pressure and symptoms.    Row Name 10/18/22 0804 11/15/22 0759         ITP Comments 30 day review completed. ITP sent to Dr. Dina Rich, Medical Director of Cardiac Rehab. Continue with ITP unless changes are made by physician. 30 day review completed. ITP sent to Dr. Dina Rich, Medical Director of Cardiac Rehab. Continue with ITP unless changes are made by physician.               Comments: 30 day review

## 2022-11-17 ENCOUNTER — Encounter: Payer: Self-pay | Admitting: Physician Assistant

## 2022-11-17 ENCOUNTER — Encounter (HOSPITAL_COMMUNITY): Payer: Medicare Other

## 2022-11-17 ENCOUNTER — Ambulatory Visit: Payer: Medicare Other | Attending: Internal Medicine | Admitting: Physician Assistant

## 2022-11-17 VITALS — BP 106/68 | HR 72 | Ht 63.0 in | Wt 137.4 lb

## 2022-11-17 DIAGNOSIS — I428 Other cardiomyopathies: Secondary | ICD-10-CM

## 2022-11-17 DIAGNOSIS — I5022 Chronic systolic (congestive) heart failure: Secondary | ICD-10-CM

## 2022-11-17 DIAGNOSIS — Z950 Presence of cardiac pacemaker: Secondary | ICD-10-CM

## 2022-11-17 LAB — CUP PACEART INCLINIC DEVICE CHECK
Battery Remaining Longevity: 86 mo
Battery Voltage: 2.98 V
Brady Statistic RA Percent Paced: 5.9 %
Brady Statistic RV Percent Paced: 94 %
Date Time Interrogation Session: 20240913175210
Implantable Lead Connection Status: 753985
Implantable Lead Connection Status: 753985
Implantable Lead Connection Status: 753985
Implantable Lead Implant Date: 20240508
Implantable Lead Implant Date: 20240508
Implantable Lead Implant Date: 20240508
Implantable Lead Location: 753858
Implantable Lead Location: 753859
Implantable Lead Location: 753860
Implantable Lead Model: 3830
Implantable Pulse Generator Implant Date: 20240508
Lead Channel Impedance Value: 475 Ohm
Lead Channel Impedance Value: 612.5 Ohm
Lead Channel Impedance Value: 625 Ohm
Lead Channel Pacing Threshold Amplitude: 0.5 V
Lead Channel Pacing Threshold Amplitude: 0.5 V
Lead Channel Pacing Threshold Amplitude: 0.75 V
Lead Channel Pacing Threshold Amplitude: 0.75 V
Lead Channel Pacing Threshold Amplitude: 0.75 V
Lead Channel Pacing Threshold Amplitude: 0.75 V
Lead Channel Pacing Threshold Pulse Width: 0.5 ms
Lead Channel Pacing Threshold Pulse Width: 0.5 ms
Lead Channel Pacing Threshold Pulse Width: 0.5 ms
Lead Channel Pacing Threshold Pulse Width: 0.5 ms
Lead Channel Pacing Threshold Pulse Width: 0.5 ms
Lead Channel Pacing Threshold Pulse Width: 0.5 ms
Lead Channel Sensing Intrinsic Amplitude: 0.8 mV
Lead Channel Sensing Intrinsic Amplitude: 12 mV
Lead Channel Setting Pacing Amplitude: 2.5 V
Lead Channel Setting Pacing Amplitude: 2.5 V
Lead Channel Setting Pacing Amplitude: 2.5 V
Lead Channel Setting Pacing Pulse Width: 0.5 ms
Lead Channel Setting Pacing Pulse Width: 0.5 ms
Lead Channel Setting Sensing Sensitivity: 2 mV
Pulse Gen Model: 3222
Pulse Gen Serial Number: 8167222

## 2022-11-17 NOTE — Progress Notes (Signed)
Cardiology Office Note:  .   Date:  11/17/2022  ID:  Heather [name], DOB 07/04/1940, MRN [id number] PCP: [name], MD  [facility] Providers AHF: Dr. Shirlee Latch EP: Dr. Graciela Husbands Cardiologist:  Marjo Bicker, MD {  History of Present Illness: .   Heather [name] is a 82 y.o. female w/PMHx of CKD (IIIb), COPD (follows with Dr. Craige Cotta), OSA (intolerant of CPAP), HTN, LBBB, stroke, chronic CHF (biVe failure), NICM [facility] 10/2021 which showed mild nonobstructive CAD. 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 Graciela Husbands 05/24/22, planned for CRT-P  Saw HF team 08/17/22, doing better post device implant, able to do ADLs, planned for labs, dig level, and an echo at her next visit   Saw Dr. Jenene Slicker 08/18/22, doing well, referred to C. Rehab  Device clinic alert for AMS >> 2/2 FF, inappropriate, industry recs PVAB be changed to 180-117ms  Low BP s reported to HF team, advised reduction in hydralazine dose  Today's visit is scheduled as 91 day post implant visit  ROS:   She feels much better! She reports her breathing much better, no rest SOB, no nocturnal or positional SOB. She is very much enjoying cardiac rehab and reports she is up to 26 laps around the exercise room! No device concerns No CP, palpitations or cardiac awareness No dizzy spells, near syncope or syncope  She reports intolerant of CPAP, that dr. Craige Cotta is arranging HS oxygen   Device information [employer]  PPM  implanted 07/12/22 leads in RV apex and septal positions by xray (and RA)   Studies Reviewed: [name]    EKG not done today 08/17/22 personally reviewed, SR/VP, RBBB morphology(127ms), LAD     DEVICE interrogation done today and reviewed by myself Battery is good P waves are smaller, (0.7 > 0.7 > 0.8), threshold and impedance on RA lead are stable/good. RV/LV leads stable Numerous AMS episodes All available EGMs are reviewed and false for  FF on the A channel. This has causes some AS/VS briefly  Observed to have occ PVCs today during her interrogation BP 94%  Lead outputs reduced to clinic standar w/approariate safety margin PVAB extended to in effor to avoid false mode switches A lead sensitivity change to 0.4 given P waves are smaller then they had been   07/03/22: TTE 1. Left ventricular ejection fraction, by estimation, is <20%. The left  ventricle has severely decreased function. The left ventricle demonstrates  global hypokinesis with septal-lateral dyssynchrony consistent with LBBB.  The left ventricular internal  cavity size was moderately dilated. Left ventricular diastolic parameters  are consistent with Grade I diastolic dysfunction (impaired relaxation).   2. Right ventricular systolic function is moderately reduced. The right  ventricular size is mildly enlarged. There is severely elevated pulmonary  artery systolic pressure. The estimated right ventricular systolic  pressure is 65.2 mmHg.   3. Left atrial size was moderately dilated.   4. The mitral valve is normal in structure. Trivial mitral valve  regurgitation. No evidence of mitral stenosis.   5. The aortic valve is tricuspid. There is moderate calcification of the  aortic valve. Aortic valve regurgitation is not visualized. No aortic  stenosis is present.   6. The inferior vena cava is normal in size with <50% respiratory  variability, suggesting right atrial pressure of 8 mmHg.    Cardiac MRI (1/24): LVEF 16%, RVEF 23%, mid-apical inferolateral LGE in prior infarction  pattern (looks like prior MI but does not explain extent of CMP).    Risk Assessment/Calculations:    Physical Exam:   VS:  There were no vitals taken for this visit.   Wt Readings from Last 3 Encounters:  11/09/22 134 lb 6.4 oz (61 kg)  10/19/22 136 lb (61.7 kg)  10/16/22 139 lb (63 kg)    GEN: Well nourished, well developed in no acute distress NECK: No JVD; No  carotid bruits CARDIAC: RR, no murmurs, rubs, gallops RESPIRATORY:  CTA b/l without rales, wheezing or rhonchi  ABDOMEN: Soft, non-tender, non-distended EXTREMITIES:  trace if any LE edema; No deformity   PPM site: is stable/well healed, no thinning, fluctuation, tethering  ASSESSMENT AND PLAN: .    CRT-P (LV lead is in LB/septal position) programmed LV > RV by 60 site is well healed intact function Programming as above   NICM Chronic CHF  No symptoms or exam findings of volume OL Symptomatically feels much better Doing great at c.Rehab C/w HF/Dr. Mallipeddi teams      Dispo: Ill see her back in 6-8 weeks to revisit A channel, any more FF issues, sooner if needed  Signed, Sheilah Pigeon, PA-C

## 2022-11-17 NOTE — Patient Instructions (Signed)
Medication Instructions:   Your physician recommends that you continue on your current medications as directed. Please refer to the Current Medication list given to you today.  *If you need a refill on your cardiac medications before your next appointment, please call your pharmacy*   Lab Work: NONE ORDERED  TODAY   If you have labs (blood work) drawn today and your tests are completely normal, you will receive your results only by: MyChart Message (if you have MyChart) OR A paper copy in the mail If you have any lab test that is abnormal or we need to change your treatment, we will call you to review the results.   Testing/Procedures: NONE ORDERED  TODAY    Follow-Up: At [facility], you and your health needs are our priority.  As part of our continuing mission to provide you with exceptional heart care, we have created designated Provider Care Teams.  These Care Teams include your primary Cardiologist (physician) and Advanced Practice Providers (APPs -  Physician Assistants and Nurse Practitioners) who all work together to provide you with the care you need, when you need it.  We recommend signing up for the patient portal called "MyChart".  Sign up information is provided on this After Visit Summary.  MyChart is used to connect with patients for Virtual Visits (Telemedicine).  Patients are able to view lab/test results, encounter notes, upcoming appointments, etc.  Non-urgent messages can be sent to your provider as well.   To learn more about what you can do with MyChart, go to [email or web address].    Your next appointment:   6 -8 week(s) .  Provider:   Francis Dowse, PA-C    Other Instructions

## 2022-11-20 ENCOUNTER — Encounter (HOSPITAL_COMMUNITY): Payer: Medicare Other

## 2022-11-20 ENCOUNTER — Encounter (HOSPITAL_COMMUNITY)
Admission: RE | Admit: 2022-11-20 | Discharge: 2022-11-20 | Disposition: A | Discharge status: Home / Self Care | Payer: Medicare Other | Source: Ambulatory Visit | Attending: Internal Medicine | Admitting: Internal Medicine

## 2022-11-20 DIAGNOSIS — I5022 Chronic systolic (congestive) heart failure: Secondary | ICD-10-CM | POA: Diagnosis not present

## 2022-11-20 NOTE — Progress Notes (Signed)
Daily Session Note  Patient Details  Name: Heather [name] MRN: [id number] Date of Birth: February 16, 1941 Referring Provider:   Flowsheet Row CARDIAC REHAB PHASE II ORIENTATION from 09/14/2022 in [facility] CARDIAC REHABILITATION  Referring Provider Luane School MD       Encounter Date: 11/20/2022  Check In:  Session Check In - 11/20/22 0900       Check-In   Supervising physician immediately available to respond to emergencies See telemetry face sheet for immediately available ER MD    Location [facility]-Cardiac & Pulmonary Rehab    Staff Present Fabio Pierce, MA, RCEP, CCRP, CCET;Heather Fredric Mare, BS, Exercise Physiologist;Rilie Glanz Laural Benes, RN, BSN    Virtual Visit No    Medication changes reported     No    Fall or balance concerns reported    No    Tobacco Cessation No Change    Warm-up and Cool-down Performed as group-led instruction    Resistance Training Performed Yes    VAD Patient? No    PAD/SET Patient? No      Pain Assessment   Currently in Pain? No/denies    Pain Score 0-No pain    Multiple Pain Sites No             Capillary Blood Glucose: No results found for this or any previous visit (from the past 24 hour(s)).    Social History   Tobacco Use  Smoking Status Former   Current packs/day: 0.00   Average packs/day: 1 pack/day for 40.0 years (40.0 ttl pk-yrs)   Types: Cigarettes   Start date: 03/20/1945   Quit date: 03/20/1985   Years since quitting: 37.6  Smokeless Tobacco Never    Goals Met:  Independence with exercise equipment Exercise tolerated well No report of concerns or symptoms today Strength training completed today  Goals Unmet:  Not Applicable  Comments: Pt able to follow exercise prescription today without complaint.  Will continue to monitor for progression.    Dr. Dina Rich is Medical Director for [facility] Cardiac Rehab

## 2022-11-21 ENCOUNTER — Ambulatory Visit: Payer: Self-pay

## 2022-11-21 NOTE — Patient Instructions (Signed)
Visit Information  Thank you for taking time to visit with me today. Please don't hesitate to contact me if I can be of assistance to you.   Following are the goals we discussed today:  Patient will contact [facility] DSS in December to apply for the LIEAP to assist with her winter power bill.  If you are experiencing a Mental Health or Behavioral Health Crisis or need someone to talk to, please call 911  Patient verbalizes understanding of instructions and care plan provided today and agrees to view in MyChart. Active MyChart status and patient understanding of how to access instructions and care plan via MyChart confirmed with patient.     No further follow up required: Patient does not request an additional follow up visit.  Lysle Morales, BSW Social Worker [phone]

## 2022-11-21 NOTE — Patient Outreach (Signed)
Care Coordination   Initial Visit Note   11/21/2022 Name: Heather [name] MRN: [id number] DOB: 06-15-1940  Heather [name] is a 82 y.o. year old female who sees Durwin Nora, Lucina Mellow, MD for primary care. I spoke with  [name] by phone today.  What matters to the patients health and wellness today?  Patient request resources for winter power bill.      Goals Addressed             This Visit's Progress    Winter Power bill       Interventions Today    Flowsheet Row Most Recent Value  Chronic Disease   Chronic disease during today's visit Diabetes, Chronic Obstructive Pulmonary Disease (COPD), Congestive Heart Failure (CHF), Hypertension (HTN), Chronic Kidney Disease/End Stage Renal Disease (ESRD)  General Interventions   General Interventions Discussed/Reviewed General Interventions Discussed, General Interventions Reviewed, [employer] requests resources for winter power bill.  Pt reports no immediate crisis with paying the current bill.  SW provided [facility] information and location to apply.  Pt reports no other needs.]              SDOH assessments and interventions completed:  Yes  SDOH Interventions Today    Flowsheet Row Most Recent Value  SDOH Interventions   Food Insecurity Interventions Intervention Not Indicated, Other (Comment)  [Pt is helped by [facility] for food]  Housing Interventions Intervention Not Indicated  Transportation Interventions Intervention Not Indicated  Utilities Interventions Intervention Not Indicated        Care Coordination Interventions:  Yes, provided   Follow up plan: No further intervention required.   Encounter Outcome:  Patient Visit Completed

## 2022-11-22 ENCOUNTER — Encounter (HOSPITAL_COMMUNITY): Payer: Medicare Other

## 2022-11-22 ENCOUNTER — Telehealth: Payer: Self-pay | Admitting: Pulmonary Disease

## 2022-11-22 NOTE — Telephone Encounter (Signed)
Patient states she has not heard anything from [employer] about her oxygen, contacted [employer] and spoke with Bjorn Loser, who states that they were unable to reach patient, who with patient informed her to call [employer] to setup payment and delivery  Patient is in the donut hole and needs samples of trelegy. Please advise

## 2022-11-24 ENCOUNTER — Encounter (HOSPITAL_COMMUNITY)
Admission: RE | Admit: 2022-11-24 | Discharge: 2022-11-24 | Disposition: A | Discharge status: Home / Self Care | Payer: Medicare Other | Source: Ambulatory Visit | Attending: Internal Medicine

## 2022-11-24 ENCOUNTER — Encounter (HOSPITAL_COMMUNITY): Payer: Medicare Other

## 2022-11-24 DIAGNOSIS — G4733 Obstructive sleep apnea (adult) (pediatric): Secondary | ICD-10-CM | POA: Diagnosis not present

## 2022-11-24 DIAGNOSIS — J441 Chronic obstructive pulmonary disease with (acute) exacerbation: Secondary | ICD-10-CM | POA: Diagnosis not present

## 2022-11-24 DIAGNOSIS — I5022 Chronic systolic (congestive) heart failure: Secondary | ICD-10-CM | POA: Diagnosis not present

## 2022-11-24 NOTE — Progress Notes (Signed)
Daily Session Note  Patient Details  Name: Heather [name] MRN: [id number] Date of Birth: [birth year] Referring Provider:   Flowsheet Row CARDIAC REHAB PHASE II ORIENTATION from 09/14/2022 in [facility] CARDIAC REHABILITATION  Referring Provider Luane School MD       Encounter Date: 11/24/2022  Check In:  Session Check In - 11/24/22 0915       Check-In   Supervising physician immediately available to respond to emergencies See telemetry face sheet for immediately available MD    Location [facility]-Card[facility] Pulmonary [facility]    Staff Present Fabio Pierce, MA, RCEP, CCRP, CCET;Heather Fredric Mare, [place], Exercise Physiologist;Jacson Rapaport Daphine Deutscher, RN, BSN    Virtual Visit No    Medication changes reported     No    Fall or balance concerns reported    No    Tobacco Cessation No Change    Warm-up and Cool-down Performed on first and last piece of equipment    Resistance Training Performed Yes    VAD Patient? No      Pain Assessment   Currently in Pain? No/denies             Capillary Blood Glucose: No results found for this or any previous visit (from the past 24 hour(s)).    Social History   Tobacco Use  Smoking Status Former   Current packs/day: 0.00   Average packs/day: 1 pack/day for 40.0 years (40.0 ttl pk-yrs)   Types: Cigarettes   Start date: 03/20/1945   Quit date: 03/20/1985   Years since quitting: 37.7  Smokeless Tobacco Never    Goals Met:  Independence with exercise equipment Exercise tolerated well No report of concerns or symptoms today Strength training completed today  Goals Unmet:  Not Applicable  Comments: Pt able to follow exercise prescription today without complaint.  Will continue to monitor for progression.    Dr. Dina Rich is Medical Director for [facility] Cardiac Rehab

## 2022-11-27 ENCOUNTER — Ambulatory Visit (HOSPITAL_BASED_OUTPATIENT_CLINIC_OR_DEPARTMENT_OTHER)
Admission: RE | Admit: 2022-11-27 | Discharge: 2022-11-27 | Disposition: A | Discharge status: Home / Self Care | Payer: Medicare Other | Source: Ambulatory Visit | Attending: Cardiology | Admitting: Cardiology

## 2022-11-27 ENCOUNTER — Encounter (HOSPITAL_COMMUNITY): Payer: Self-pay | Admitting: Cardiology

## 2022-11-27 ENCOUNTER — Other Ambulatory Visit: Payer: Self-pay

## 2022-11-27 ENCOUNTER — Inpatient Hospital Stay (HOSPITAL_COMMUNITY)
Admission: RE | Admit: 2022-11-27 | Discharge: 2022-11-27 | Disposition: A | Discharge status: Home / Self Care | Payer: Medicare Other | Source: Ambulatory Visit | Attending: Cardiology | Admitting: Cardiology

## 2022-11-27 ENCOUNTER — Ambulatory Visit (HOSPITAL_COMMUNITY)
Admission: RE | Admit: 2022-11-27 | Discharge: 2022-11-27 | Disposition: A | Discharge status: Home / Self Care | Payer: Medicare Other | Source: Ambulatory Visit | Attending: Cardiology | Admitting: Cardiology

## 2022-11-27 ENCOUNTER — Other Ambulatory Visit (HOSPITAL_COMMUNITY): Payer: Self-pay | Admitting: Cardiology

## 2022-11-27 VITALS — BP 118/60 | HR 54 | Wt 137.8 lb

## 2022-11-27 DIAGNOSIS — Z7982 Long term (current) use of aspirin: Secondary | ICD-10-CM | POA: Diagnosis not present

## 2022-11-27 DIAGNOSIS — I130 Hypertensive heart and chronic kidney disease with heart failure and stage 1 through stage 4 chronic kidney disease, or unspecified chronic kidney disease: Secondary | ICD-10-CM | POA: Insufficient documentation

## 2022-11-27 DIAGNOSIS — I493 Ventricular premature depolarization: Secondary | ICD-10-CM

## 2022-11-27 DIAGNOSIS — N1832 Chronic kidney disease, stage 3b: Secondary | ICD-10-CM | POA: Diagnosis not present

## 2022-11-27 DIAGNOSIS — Z7984 Long term (current) use of oral hypoglycemic drugs: Secondary | ICD-10-CM | POA: Diagnosis not present

## 2022-11-27 DIAGNOSIS — I428 Other cardiomyopathies: Secondary | ICD-10-CM | POA: Insufficient documentation

## 2022-11-27 DIAGNOSIS — R0602 Shortness of breath: Secondary | ICD-10-CM | POA: Insufficient documentation

## 2022-11-27 DIAGNOSIS — I5022 Chronic systolic (congestive) heart failure: Secondary | ICD-10-CM

## 2022-11-27 DIAGNOSIS — E785 Hyperlipidemia, unspecified: Secondary | ICD-10-CM | POA: Diagnosis not present

## 2022-11-27 DIAGNOSIS — I2510 Atherosclerotic heart disease of native coronary artery without angina pectoris: Secondary | ICD-10-CM | POA: Insufficient documentation

## 2022-11-27 DIAGNOSIS — Z79899 Other long term (current) drug therapy: Secondary | ICD-10-CM | POA: Insufficient documentation

## 2022-11-27 DIAGNOSIS — I272 Pulmonary hypertension, unspecified: Secondary | ICD-10-CM | POA: Insufficient documentation

## 2022-11-27 DIAGNOSIS — G4733 Obstructive sleep apnea (adult) (pediatric): Secondary | ICD-10-CM | POA: Diagnosis not present

## 2022-11-27 DIAGNOSIS — Z8673 Personal history of transient ischemic attack (TIA), and cerebral infarction without residual deficits: Secondary | ICD-10-CM | POA: Insufficient documentation

## 2022-11-27 DIAGNOSIS — I447 Left bundle-branch block, unspecified: Secondary | ICD-10-CM | POA: Insufficient documentation

## 2022-11-27 LAB — ECHOCARDIOGRAM COMPLETE
AR max vel: 1.72 cm2
AV Area VTI: 1.69 cm2
AV Area mean vel: 1.64 cm2
AV Mean grad: 2.5 mmHg
AV Peak grad: 3.8 mmHg
Ao pk vel: 0.98 m/s
S' Lateral: 4.9 cm

## 2022-11-27 LAB — BASIC METABOLIC PANEL
Anion gap: 8 (ref 5–15)
BUN: 75 mg/dL — ABNORMAL HIGH (ref 8–23)
CO2: 23 mmol/L (ref 22–32)
Calcium: 9.2 mg/dL (ref 8.9–10.3)
Chloride: 105 mmol/L (ref 98–111)
Creatinine, Ser: 2.24 mg/dL — ABNORMAL HIGH (ref 0.44–1.00)
GFR, Estimated: 22 mL/min — ABNORMAL LOW (ref >60)
Glucose, Bld: 84 mg/dL (ref 70–99)
Potassium: 4.4 mmol/L (ref 3.5–5.1)
Sodium: 136 mmol/L (ref 135–145)

## 2022-11-27 LAB — DIGOXIN LEVEL: Digoxin Level: 1.2 ng/mL (ref 0.8–2.0)

## 2022-11-27 LAB — BRAIN NATRIURETIC PEPTIDE: B Natriuretic Peptide: 1285.1 pg/mL — ABNORMAL HIGH (ref 0.0–100.0)

## 2022-11-27 MED ORDER — FLUCONAZOLE 150 MG PO TABS: 150.0000 mg | ORAL_TABLET | Freq: Once | ORAL | 0 refills | Status: AC

## 2022-11-27 MED ORDER — PERFLUTREN LIPID MICROSPHERE
1.0000 mL | INTRAVENOUS | Status: DC | PRN
  Administered 2022-11-27: 4 mL via INTRAVENOUS
  Filled 2022-11-27: qty 10

## 2022-11-27 MED ORDER — BISOPROLOL FUMARATE 5 MG PO TABS: 5.0000 mg | ORAL_TABLET | Freq: Every day | ORAL | 3 refills | Status: DC

## 2022-11-27 NOTE — Patient Instructions (Signed)
INCREASE Bisoprolol to 5 mg daily.  TAKE Fluconazole 150 mg FOR 1 DOSE.  Labs done today, your results will be available in MyChart, we will contact you for abnormal readings.  Your provider has recommended that  you wear a Zio Patch for 7 days.  This monitor will record your heart rhythm for our review.  IF you have any symptoms while wearing the monitor please press the button.  If you have any issues with the patch or you notice a red or orange light on it please call the company at [phone].  Once you remove the patch please mail it back to the company as soon as possible so we can get the results.   Your physician recommends that you schedule a follow-up appointment in: 6 weeks  If you have any questions or concerns before your next appointment please send [place] a message through [place] or call our office at [phone].    TO LEAVE A MESSAGE FOR THE NURSE SELECT OPTION 2, PLEASE LEAVE A MESSAGE INCLUDING: YOUR NAME DATE OF BIRTH CALL BACK NUMBER REASON FOR CALL**this is important as we prioritize the call backs  YOU WILL RECEIVE A CALL BACK THE SAME DAY AS LONG AS YOU CALL BEFORE 4:00 PM  At the [facility], you and your health needs are our priority. As part of our continuing mission to provide you with exceptional heart care, we have created designated Provider Care Teams. These Care Teams include your primary Cardiologist (physician) and Advanced Practice Providers (APPs- Physician Assistants and Nurse Practitioners) who all work together to provide you with the care you need, when you need it.   You may see any of the following providers on your designated Care Team at your next follow up: Dr Arvilla Meres Dr Marca Ancona Dr. Marcos Eke, NP Robbie Lis, [place] [facility] [place], [place] Brynda Peon, NP Karle Plumber, PharmD   Please be sure to bring in all your medications bottles to every appointment.    Thank you for  choosing [facility]

## 2022-11-27 NOTE — Progress Notes (Signed)
ReDS Vest / Clip - 11/27/22 1100       ReDS Vest / Clip   Station Marker A    Ruler Value 30    ReDS Value Range Low volume    ReDS Actual Value 25

## 2022-11-27 NOTE — Progress Notes (Signed)
*  PRELIMINARY RESULTS* Echocardiogram 2D Echocardiogram has been performed.  Earlie Server Jahiem Franzoni 11/27/2022, 10:10 AM

## 2022-11-27 NOTE — Progress Notes (Signed)
ADVANCED HF CLINIC NOTE  Primary Care: [name], MD HF Cardiologist: Dr. Shirlee Latch  HPI: 82 y.o. [name] w/ h/o CKD IIIb, LBBB, R cerebellar CVA and chronic biventricular heart failure first diagnosed 10/2021. Echo then showed severely reduced LVEF 15-20%, RV mildly reduced, mild-mod MR. Echo also showed dyssynchrony c/w LBBB. Of note, prior echo in 2017 showed normal LVEF w/ septal dyssynchrony and normal RV.   She underwent [facility] 10/2021 which showed mild nonobstructive CAD. 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 GDMT regimen included Farxiga, Imdur, Bisoprolol. Diuretic regimen Lasix 20 mg daily.    Echo 12/23 showed EF still severely reduced < 15%, progression of RV dysfunction (mod reduced) and severely elevated PA pressure w/ estimated RVSP 68 mmHg.     Admitted 1/24 with CHF exacerbation. Started on lasix gtt, and placed on empiric milrinone to augment diuresis. GDMT held w/ soft BP. cMRI showed LVEF 16%, mid-apical inferolateral subendocardial LGE, and moderately dilated RV with RVEF 23%. Milrinone was weaned and GDMT titrated. She was discharged home with HH, weight 150 lbs.  Echo 4/24 showed EF <20% with moderate LV enlargement, septal-lateral dyssynchrony, moderate RV dysfunction, PASP 65 mmHg. She was referred to EP and underwent Medtronic CRT-P placement 5/24. Echo was done today and reviewed, EF 20-25%, mild LV dilation, moderate RV dysfunction with normal size.   She returns back today for followup of CHF. Weight is stable.  She feels better with CRT placement, now able to walk through [employer] without stopping. No chest pain. She has been going to cardiac rehab, feels like this helps.  She gets short of breath if she tries to "walk fast."  No orthopnea/PND.  No palpitations. Using oxygen at night. She feels like she has a yeast infection.   REDS clip 24%   ECG (personally reviewed): A-BiV pacing with  PVCs  Medtronic device interrogation: 4.8% A-pacing, 87% BiV pacing. Corvue suggests volume overload. No AF.   Labs (1/24): K 4.0, creatinine 1.33, hgb 12.9 Labs (3/24): K 4.4, creatinine 1.58, LDL 82 Labs (5/24): K 4.6, creatinine 1.39, Digoxin 2.3  Labs (6/24): digoxin 1.1, LDL 81, K 4.2, creatinine 1.6  PMH: 1. Chronic systolic CHF: Nonischemic cardiomyopathy. Chronic LBBB. Medtronic CRT-P device.  - Angioedema with ACEI. - R/LHC (8/23): mild, non-obstructive CAD; RA mean 8, PA 54/18 (mean 34), PCWP 18, CO/CI (Fick) 3.87/2.24  - Echo (12/23): EF < 15%, moderately reduced RV function, severely elevated PA pressure (RVSP 68 mmHg) - Cardiac MRI (1/24): LVEF 16%, RVEF 23%, mid-apical inferolateral LGE in prior infarction pattern (looks like prior MI but does not explain extent of CMP).  - Echo (4/24): EF <20% with moderate LV enlargement, septal-lateral dyssynchrony, moderate RV dysfunction, PASP 65 mmHg.  - Echo (9/24): EF 20-25%, mild LV dilation, moderate RV dysfunction with normal size.  2. COPD: Prior smoker.  3. CKD stage 3 4. CVA: prior cerebellar CVA.  5. HTN 6. OSA  Current Outpatient Medications  Medication Sig Dispense Refill   acetaminophen (TYLENOL) 500 MG tablet Take 500 mg by mouth every 6 (six) hours as needed for mild pain or moderate pain.     allopurinol (ZYLOPRIM) 300 MG tablet TAKE 1/2 TABLET BY MOUTH ONCE DAILY 45 tablet 0   aspirin (ASPIRIN CHILDRENS) 81 MG chewable tablet Chew 1 tablet (81 mg total) by mouth daily. 90 tablet 3   atorvastatin (LIPITOR) 20 MG tablet Take 1 tablet (20 mg  total) by mouth daily. 90 tablet 3   chlorpheniramine (CHLOR-TRIMETON) 4 MG tablet Take 4 mg by mouth daily as needed for allergies.     Cholecalciferol (DIALYVITE VITAMIN D 5000 PO) Take 5,000 Units by mouth daily.     dapagliflozin propanediol (FARXIGA) 10 MG TABS tablet Take 1 tablet (10 mg total) by mouth daily. 90 tablet 3   digoxin (LANOXIN) 0.125 MG tablet Take 0.5 tablets  (0.0625 mg total) by mouth daily. 90 tablet 3   famotidine (PEPCID) 20 MG tablet Take 1 tablet (20 mg total) by mouth at bedtime. One after supper 30 tablet 3   fluconazole (DIFLUCAN) 150 MG tablet Take 1 tablet (150 mg total) by mouth once for 1 dose. 1 tablet 0   Fluticasone-Umeclidin-Vilant (TRELEGY ELLIPTA) 200-62.5-25 MCG/ACT AEPB Inhale 1 puff into the lungs daily. 1 each 0   furosemide (LASIX) 40 MG tablet Take 60 mg by mouth daily.     hydrALAZINE (APRESOLINE) 25 MG tablet Take 0.5 tablets (12.5 mg total) by mouth 3 (three) times daily. 90 tablet 3   HYDROcodone-acetaminophen (NORCO/VICODIN) 5-325 MG tablet Take 1 tablet by mouth every 6 (six) hours as needed for moderate pain. 120 tablet 0   isosorbide mononitrate (IMDUR) 30 MG 24 hr tablet Take 0.5 tablets (15 mg total) by mouth daily. 45 tablet 3   ketoconazole (NIZORAL) 2 % cream Apply 1 Application topically as needed for irritation.     losartan (COZAAR) 25 MG tablet Take 1 tablet (25 mg total) by mouth daily. 90 tablet 3   Multiple Vitamins-Minerals (MULTIVITAMIN WITH MINERALS) tablet Take 1 tablet by mouth daily.     PROAIR HFA 108 (90 Base) MCG/ACT inhaler Inhale 2 puffs into the lungs every 6 (six) hours as needed for wheezing or shortness of breath. 18 g 2   Propylene Glycol (SYSTANE COMPLETE) 0.6 % SOLN Place 1 drop into both eyes 2 (two) times daily.     spironolactone (ALDACTONE) 25 MG tablet Take 1 tablet (25 mg total) by mouth daily. 30 tablet 5   venlafaxine XR (EFFEXOR-XR) 75 MG 24 hr capsule Take 1 capsule (75 mg total) by mouth See admin instructions. 30 capsule 5   bisoprolol (ZEBETA) 5 MG tablet Take 1 tablet (5 mg total) by mouth daily. 90 tablet 3   No current facility-administered medications for this encounter.   Allergies  Allergen Reactions   Lovastatin Swelling    Patient states that her tongue swells and she gets a tingling feeling all over. Patient states that her tongue swells and she gets a tingling  feeling all over.   Lisinopril Swelling    Tongue swelling   Social History   Socioeconomic History   Marital status: Divorced    Spouse name: Not on file   Number of children: Not on file   Years of education: Not on file   Highest education level: 9th grade  Occupational History   Not on file  Tobacco Use   Smoking status: Former    Current packs/day: 0.00    Average packs/day: 1 pack/day for 40.0 years (40.0 ttl pk-yrs)    Types: Cigarettes    Start date: 03/20/1945    Quit date: 03/20/1985    Years since quitting: 37.7   Smokeless tobacco: Never  Vaping Use   Vaping status: Never Used  Substance and Sexual Activity   Alcohol use: No   Drug use: No   Sexual activity: Yes    Birth control/protection: Surgical  Other Topics  Concern   Not on file  Social History Narrative   Mrs Renton is a 82 year old divorced female who lives alone. She has support of her son and sister.    Social Determinants of Health   Financial Resource Strain: Medium Risk (11/27/2022)   Overall Financial Resource Strain (CARDIA)    Difficulty of Paying Living Expenses: Somewhat hard  Food Insecurity: No Food Insecurity (11/27/2022)   Hunger Vital Sign    Worried About Running Out of Food in the Last Year: Never true    Ran Out of Food in the Last Year: Never true  Recent Concern: Food Insecurity - Food Insecurity Present (11/21/2022)   Hunger Vital Sign    Worried About Running Out of Food in the Last Year: Never true    Ran Out of Food in the Last Year: Sometimes true  Transportation Needs: Unmet Transportation Needs (11/27/2022)   PRAPARE - Administrator, Civil Service (Medical): Yes    Lack of Transportation (Non-Medical): No  Physical Activity: Insufficiently Active (11/27/2022)   Exercise Vital Sign    Days of Exercise per Week: 3 days    Minutes of Exercise per Session: 40 min  Stress: Stress Concern Present (11/27/2022)   [employer] of Occupational Health - Occupational  Stress Questionnaire    Feeling of Stress : To some extent  Social Connections: Moderately Isolated (11/27/2022)   Social Connection and Isolation Panel [NHANES]    Frequency of Communication with Friends and Family: More than three times a week    Frequency of Social Gatherings with Friends and Family: More than three times a week    Attends Religious Services: More than 4 times per year    Active Member of [employer] or Organizations: No    Attends Banker Meetings: Never    Marital Status: Widowed  Intimate Partner Violence: Not At Risk (11/09/2022)   Humiliation, Afraid, Rape, and Kick questionnaire    Fear of Current or Ex-Partner: No    Emotionally Abused: No    Physically Abused: No    Sexually Abused: No   Family History  Problem Relation Age of Onset   High blood pressure Mother    Aneurysm Father    Diabetes Brother    Pancreatitis Brother    Hypertension Niece    Congestive Heart Failure Niece    Wt Readings from Last 3 Encounters:  11/27/22 62.5 kg (137 lb 12.8 oz)  11/17/22 62.3 kg (137 lb 6.4 oz)  11/09/22 61 kg (134 lb 6.4 oz)   BP 118/60   Pulse (!) 54   Wt 62.5 kg (137 lb 12.8 oz)   SpO2 96%   BMI 24.41 kg/m   PHYSICAL EXAM: General: NAD Neck: JVP 7-8 cm, no thyromegaly or thyroid nodule.  Lungs: Clear to auscultation bilaterally with normal respiratory effort. CV: Nondisplaced PMI.  Heart regular S1/S2, no S3/S4, no murmur.  No peripheral edema.  No carotid bruit.  Normal pedal pulses.  Abdomen: Soft, nontender, no hepatosplenomegaly, no distention.  Skin: Intact without lesions or rashes.  Neurologic: Alert and oriented x 3.  Psych: Normal affect. Extremities: No clubbing or cyanosis.  HEENT: Normal.   ASSESSMENT & PLAN: 1. Chronic systolic CHF: Nonischemic cardiomyopathy. Cath in 8/23 with no significant CAD, CI 2.24. ?LBBB cardiomyopathy versus familial versus prior myocarditis. Echo in 12/23 with EF < 20%, moderately reduced RV function,  D-shaped septum, mild-moderate MR. She was admitted 1/24 with volume overload, empirically started on milrinone  0.25 and Lasix gtt.  Cardiac MRI showed LV severely dilated with EF 16% and septal-lateral dyssynchrony, RV moderately dilated with EF 23%, mid-apical inferolateral LGE in prior infarction pattern. However, CAD does not explain the extent of her cardiomyopathy based on cath. Echo in 5/23 showed EF <20% with moderate LV enlargement, septal-lateral dyssynchrony, moderate RV dysfunction, PASP 65 mmHg. Underwent CRT-P 5/24.  Echo today showed that EF is still 20-25%.  She is noted to only be BiV pacing 87% of the time, frequent PVCs noted on ECG. I am concerned that PVCs are decreasing her BiV pacing percentage and also could cause worsening LV function if >10% of total beats.  NYHA class II.  Corvue suggests volume overload, but exam and REDs clip do not.  - Increase bisoprolol to 5 mg daily. - Continue Lasix 60 mg daily. BMET/BNP today.  - Continue spironolactone 25 mg daily.  - On digoxin 0.0625. Check digoxin level today, if elevated will need to discontinued  - Continue Farxiga 10 mg daily.  Has possible yeast infection, will treat with 1 dose of fluconazole.  - Continue losartan 25 mg daily (angioedema with lisinopril so no Entresto). - Continue hydralazine 25 mg tid - Continue Imdur 30 mg daily  - I will arrange for Zio monitor x 1 week to quantify PVCs.  If there is a significant burden that could be decreasing BiV pacing percentage or affected LV systolic function, would start amiodarone to suppress.  2. CAD: mild nonobstructive disease by cath 8/23, 25% mRCA plaque. No chest pain.  - continue ASA + statin  3. HLD: on statin for CAD and h/o CVA. LDL Goal < 70 4. CKD stage 3: Baseline creatinine around 1.5. - BMET today.  5. H/o right cerebellar CVA: On ASA 81 and statin.  6. OSA: Encouraged CPAP use. 7. 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   - Quantify PVCs via Zio monitor.  - May need suppression via amiodarone.  - Increase bisoprolol to 5 mg daily.    Follow up in 6 wks with APP.     Marca Ancona,  11/27/22

## 2022-11-28 ENCOUNTER — Telehealth (HOSPITAL_COMMUNITY): Payer: Self-pay

## 2022-11-28 DIAGNOSIS — I5022 Chronic systolic (congestive) heart failure: Secondary | ICD-10-CM

## 2022-11-28 MED ORDER — FUROSEMIDE 40 MG PO TABS: 40.0000 mg | ORAL_TABLET | Freq: Every day | ORAL | Status: DC

## 2022-11-28 NOTE — Telephone Encounter (Signed)
-----   Message from [employer] sent at 11/27/2022  2:16 PM EDT ----- Hold Lasix x 2 days, decrease to 40 mg daily.  BMET in 1 week.

## 2022-11-28 NOTE — Telephone Encounter (Signed)
Spoke with patient regarding the following results. Patient made aware and patient verbalized understanding.   Medication list updated, patient would like labs done at [facility]- orders placed. Advised patient to go next week- patient aware and verbalized understanding.

## 2022-11-28 NOTE — Progress Notes (Signed)
Remote pacemaker transmission.   

## 2022-11-29 ENCOUNTER — Encounter (HOSPITAL_COMMUNITY): Payer: Medicare Other

## 2022-11-29 ENCOUNTER — Encounter (HOSPITAL_COMMUNITY)
Admission: RE | Admit: 2022-11-29 | Discharge: 2022-11-29 | Disposition: A | Discharge status: Home / Self Care | Payer: Medicare Other | Source: Ambulatory Visit | Attending: Internal Medicine | Admitting: Internal Medicine

## 2022-11-29 DIAGNOSIS — I5022 Chronic systolic (congestive) heart failure: Secondary | ICD-10-CM

## 2022-11-29 NOTE — Progress Notes (Signed)
Daily Session Note  Patient Details  Name: Heather [name] MRN: [id number] Date of Birth: 11/27/1940 Referring Provider:   Flowsheet Row CARDIAC REHAB PHASE II ORIENTATION from 09/14/2022 in [facility] CARDIAC REHABILITATION  Referring Provider Luane School MD       Encounter Date: 11/29/2022  Check In:  Session Check In - 11/29/22 0915       Check-In   Supervising physician immediately available to respond to emergencies [facility] MD immediately available    Physician(s) Dr. [name]    Location [facility]    Staff Present Ross Ludwig, BS, Exercise Physiologist;Jessica Juanetta Gosling, MA, RCEP, CCRP, CCET;Kellyann Ordway BSN, RN    Virtual Visit No    Medication changes reported     Yes    Comments Bisoprolol increased to 5 mg daily, and on 9/23 pt was instructed to hold Lasix for 2 days then decrease Lasix to 40 mg daily    Fall or balance concerns reported    No    Tobacco Cessation No Change    Warm-up and Cool-down Performed on first and last piece of equipment    Resistance Training Performed Yes    VAD Patient? No    PAD/SET Patient? No      Pain Assessment   Currently in Pain? No/denies    Pain Score 0-No pain    Multiple Pain Sites No             Capillary Blood Glucose: No results found for this or any previous visit (from the past 24 hour(s)).    Social History   Tobacco Use  Smoking Status Former   Current packs/day: 0.00   Average packs/day: 1 pack/day for 40.0 years (40.0 ttl pk-yrs)   Types: Cigarettes   Start date: 03/20/1945   Quit date: 03/20/1985   Years since quitting: 37.7  Smokeless Tobacco Never    Goals Met:  Independence with exercise equipment Exercise tolerated well No report of concerns or symptoms today Strength training completed today  Goals Unmet:  Not Applicable  Comments: Marland KitchenMarland KitchenPt able to follow exercise prescription today without complaint.  Will continue to monitor for progression.    Dr. Dina Rich  is Medical Director for [facility] Cardiac Rehab

## 2022-11-29 NOTE — Telephone Encounter (Signed)
PT calling again to see if we have any Trelegy samples. She is in the doughnut hole.  Oxygen issue resolved.   [phone]

## 2022-12-01 ENCOUNTER — Ambulatory Visit (INDEPENDENT_AMBULATORY_CARE_PROVIDER_SITE_OTHER): Payer: Medicare Other | Admitting: Internal Medicine

## 2022-12-01 ENCOUNTER — Encounter: Payer: Self-pay | Admitting: Internal Medicine

## 2022-12-01 VITALS — BP 102/62 | HR 80 | Resp 16 | Ht 62.5 in | Wt 138.0 lb

## 2022-12-01 DIAGNOSIS — G4733 Obstructive sleep apnea (adult) (pediatric): Secondary | ICD-10-CM

## 2022-12-01 DIAGNOSIS — N1832 Chronic kidney disease, stage 3b: Secondary | ICD-10-CM

## 2022-12-01 DIAGNOSIS — J449 Chronic obstructive pulmonary disease, unspecified: Secondary | ICD-10-CM | POA: Diagnosis not present

## 2022-12-01 DIAGNOSIS — Z23 Encounter for immunization: Secondary | ICD-10-CM | POA: Insufficient documentation

## 2022-12-01 DIAGNOSIS — I5022 Chronic systolic (congestive) heart failure: Secondary | ICD-10-CM

## 2022-12-01 DIAGNOSIS — E1122 Type 2 diabetes mellitus with diabetic chronic kidney disease: Secondary | ICD-10-CM

## 2022-12-01 NOTE — Progress Notes (Signed)
Established Patient Office Visit  Subjective   Patient ID: Heather [name], female    DOB: 07/05/1940  Age: 82 y.o. MRN: [id number]  Chief Complaint  Patient presents with   Follow-up    CHF- cardiology has her wearing a device to monitor her heartrate and she has to turn it in next week    [name] returns to care today for routine follow-up.  She was last evaluated by me in June.  No medication changes were made at that time and 33-month follow-up was arranged.  In the interim, she has attended cardiac rehab.  She has been seen by pulmonology for follow-up.  Supplemental oxygen added due to overnight hypoxia.  Most recently, she was evaluated by the advanced HF clinic earlier this week (9/23).  Bisoprolol was increased to 5 mg daily due to frequent PVCs.  She is wearing a ZIO monitor x 1 week to quantify PVCs.  There have otherwise been no acute interval events.  [name] reports feeling well today.  She is asymptomatic and has no acute concerns to discuss.  Past Medical History:  Diagnosis Date   Anemia    Anxiety    Arthritis    COPD (chronic obstructive pulmonary disease) (HCC)    CVA (cerebral vascular accident) (HCC)    Depression    Dry eyes 04/14/2014   GERD (gastroesophageal reflux disease)    Glaucoma    Gout    HTN (hypertension)    OSA (obstructive sleep apnea)    Past Surgical History:  Procedure Laterality Date   ABDOMINAL HYSTERECTOMY     BACK SURGERY     lumbar-disc   BIV PACEMAKER INSERTION CRT-P N/A 07/12/2022   Procedure: BIV PACEMAKER INSERTION CRT-P;  Surgeon: Duke Salvia, MD;  Location: [facility] INVASIVE CV LAB;  Service: Cardiovascular;  Laterality: N/A;   BUNIONECTOMY Bilateral    FOOT SURGERY Left    repair of "kissing Cousins"   JOINT REPLACEMENT Bilateral    hip    JOINT REPLACEMENT Right 2010 or 2012   knee   KNEE SURGERY     RIGHT/LEFT HEART CATH AND CORONARY ANGIOGRAPHY N/A 10/27/2021   Procedure: RIGHT/LEFT HEART CATH AND CORONARY ANGIOGRAPHY;   Surgeon: Corky Crafts, MD;  Location: [facility] INVASIVE CV LAB;  Service: Cardiovascular;  Laterality: N/A;   SHOULDER ARTHROSCOPY Right    shoulder surgery in [place] about 2000   SHOULDER HEMI-ARTHROPLASTY Left 03/25/2014   Procedure: LEFT SHOULDER HEMI-ARTHROPLASTY;  Surgeon: Vickki Hearing, MD;  Location: [facility] ORS;  Service: Orthopedics;  Laterality: Left;   Social History   Tobacco Use   Smoking status: Former    Current packs/day: 0.00    Average packs/day: 1 pack/day for 40.0 years (40.0 ttl pk-yrs)    Types: Cigarettes    Start date: 03/20/1945    Quit date: 03/20/1985    Years since quitting: 37.7   Smokeless tobacco: Never  Vaping Use   Vaping status: Never Used  Substance Use Topics   Alcohol use: No   Drug use: No   Family History  Problem Relation Age of Onset   High blood pressure Mother    Aneurysm Father    Diabetes Brother    Pancreatitis Brother    Hypertension Niece    Congestive Heart Failure Niece    Allergies  Allergen Reactions   Lovastatin Swelling    Patient states that her tongue swells and she gets a tingling feeling all over. Patient states that her tongue  swells and she gets a tingling feeling all over.   Lisinopril Swelling    Tongue swelling   Review of Systems  Constitutional:  Negative for chills and fever.  HENT:  Negative for sore throat.   Respiratory:  Negative for cough and shortness of breath.   Cardiovascular:  Negative for chest pain, palpitations and leg swelling.  Gastrointestinal:  Negative for abdominal pain, blood in stool, constipation, diarrhea, nausea and vomiting.  Genitourinary:  Negative for dysuria and hematuria.  Musculoskeletal:  Negative for myalgias.  Skin:  Negative for itching and rash.  Neurological:  Negative for dizziness and headaches.  Psychiatric/Behavioral:  Negative for depression and suicidal ideas.       Objective:     BP 102/62 (BP Location: Left Arm, Patient Position: Sitting)   Pulse 80    Resp 16   Ht 5' 2.5" (1.588 m)   Wt 138 lb (62.6 kg)   SpO2 92%   BMI 24.84 kg/m  BP Readings from Last 3 Encounters:  12/01/22 102/62  11/27/22 118/60  11/17/22 106/68   Physical Exam Vitals reviewed.  Constitutional:      General: She is not in acute distress.    Appearance: Normal appearance. She is not toxic-appearing.  HENT:     Head: Normocephalic and atraumatic.     Right Ear: External ear normal.     Left Ear: External ear normal.     Nose: Nose normal. No congestion or rhinorrhea.     Mouth/Throat:     Mouth: Mucous membranes are moist.     Pharynx: Oropharynx is clear. No oropharyngeal exudate or posterior oropharyngeal erythema.  Eyes:     General: No scleral icterus.    Extraocular Movements: Extraocular movements intact.     Conjunctiva/sclera: Conjunctivae normal.     Pupils: Pupils are equal, round, and reactive to light.  Cardiovascular:     Rate and Rhythm: Normal rate and regular rhythm.     Pulses: Normal pulses.     Heart sounds: Normal heart sounds. No murmur heard.    No friction rub. No gallop.     Comments: Zio monitor on left chest wall Pulmonary:     Effort: Pulmonary effort is normal.     Breath sounds: Normal breath sounds. No wheezing, rhonchi or rales.  Abdominal:     General: Abdomen is flat. Bowel sounds are normal. There is no distension.     Palpations: Abdomen is soft.     Tenderness: There is no abdominal tenderness.  Musculoskeletal:        General: No swelling. Normal range of motion.     Cervical back: Normal range of motion.     Right lower leg: No edema.     Left lower leg: No edema.  Lymphadenopathy:     Cervical: No cervical adenopathy.  Skin:    General: Skin is warm and dry.     Capillary Refill: Capillary refill takes less than 2 seconds.     Coloration: Skin is not jaundiced.  Neurological:     General: No focal deficit present.     Mental Status: She is alert and oriented to person, place, and time.   Psychiatric:        Mood and Affect: Mood normal.        Behavior: Behavior normal.   Last CBC Lab Results  Component Value Date   WBC 6.7 07/05/2022   HGB 11.4 07/05/2022   HCT 35.6 07/05/2022   MCV 94 07/05/2022  MCH 30.0 07/05/2022   RDW 14.9 07/05/2022   PLT 385 07/05/2022   Last metabolic panel Lab Results  Component Value Date   GLUCOSE 84 11/27/2022   NA 136 11/27/2022   K 4.4 11/27/2022   CL 105 11/27/2022   CO2 23 11/27/2022   BUN 75 (H) 11/27/2022   CREATININE 2.24 (H) 11/27/2022   GFRNONAA 22 (L) 11/27/2022   CALCIUM 9.2 11/27/2022   PHOS 3.1 03/17/2022   PROT 6.8 08/17/2022   ALBUMIN 3.6 08/17/2022   LABGLOB 2.7 05/26/2022   AGRATIO 1.4 05/26/2022   BILITOT 0.5 08/17/2022   ALKPHOS 75 08/17/2022   AST 28 08/17/2022   ALT 19 08/17/2022   ANIONGAP 8 11/27/2022   Last lipids Lab Results  Component Value Date   CHOL 172 08/17/2022   HDL 82 08/17/2022   LDLCALC 81 08/17/2022   TRIG 45 08/17/2022   CHOLHDL 2.1 08/17/2022   Last hemoglobin A1c Lab Results  Component Value Date   HGBA1C 6.5 (H) 05/26/2022   Last thyroid functions Lab Results  Component Value Date   TSH 1.320 05/26/2022   Last vitamin D Lab Results  Component Value Date   VD25OH 60.7 05/26/2022   Last vitamin B12 and Folate Lab Results  Component Value Date   VITAMINB12 778 05/26/2022   FOLATE >20.0 05/26/2022     Assessment & Plan:   Problem List Items Addressed This Visit       Chronic systolic HF (heart failure) (HCC) - Primary (Chronic)    Remains euvolemic on exam today.  Recently seen by the advanced HF clinic for follow-up.  She is on appropriate GDMT.  S/p CRT-D implantation on 5/8.  Currently wearing ZIO monitor for quantification of PVCs.  Bisoprolol recently increased to 5 mg daily. -Cardiology follow-up scheduled for 6 weeks      OSA on CPAP (Chronic)    Not using CPAP, but has recently been started on overnight supplemental oxygen (2 L [place]).       COPD GOLD 1     Followed by pulmonology (Dr. Craige Cotta).  Recently seen for follow-up.  Pulmonary exam was unremarkable.  Symptoms remain well-controlled with Trelegy.      Type 2 diabetes mellitus with diabetic chronic kidney disease (HCC)    A1c 6.5 on labs from March.  She is currently prescribed Farxiga 10 mg daily. -Repeat A1c ordered today -Diabetes related preventative care items are up-to-date      Chronic kidney disease, stage 3b (HCC)    AKI on CKD noted on recent labs.  Per cardiology, patient has been instructed to hold Lasix x 2 days and reduce Lasix to 40 mg daily after.  Repeat BMP is pending.  She is currently prescribed Farxiga 10 mg daily.      Need for influenza vaccination    Influenza vaccine administered today       Return in about 3 months (around 03/02/2023).    [name], MD

## 2022-12-01 NOTE — Assessment & Plan Note (Addendum)
AKI on CKD noted on recent labs.  Per cardiology, patient has been instructed to hold Lasix x 2 days and reduce Lasix to 40 mg daily after.  Repeat BMP is pending.  She is currently prescribed Farxiga 10 mg daily.

## 2022-12-01 NOTE — Assessment & Plan Note (Signed)
A1c 6.5 on labs from March.  She is currently prescribed Farxiga 10 mg daily. -Repeat A1c ordered today -Diabetes related preventative care items are up-to-date

## 2022-12-01 NOTE — Patient Instructions (Signed)
It was a pleasure to see you today.  Thank you for giving [place] the opportunity to be involved in your care.  Below is a brief recap of your visit and next steps.  We will plan to see you again in 3 months.  Summary No medication changes today Repeat A1c today Repeat labs with cardiology next week Flu shot today Follow up in 3 months

## 2022-12-01 NOTE — Assessment & Plan Note (Signed)
Influenza vaccine administered today.

## 2022-12-01 NOTE — Assessment & Plan Note (Signed)
Not using CPAP, but has recently been started on overnight supplemental oxygen (2 L [place]).

## 2022-12-01 NOTE — Assessment & Plan Note (Signed)
Followed by pulmonology (Dr. Craige Cotta).  Recently seen for follow-up.  Pulmonary exam was unremarkable.  Symptoms remain well-controlled with Trelegy.

## 2022-12-01 NOTE — Assessment & Plan Note (Signed)
Remains euvolemic on exam today.  Recently seen by the advanced HF clinic for follow-up.  She is on appropriate GDMT.  S/p CRT-D implantation on 5/8.  Currently wearing ZIO monitor for quantification of PVCs.  Bisoprolol recently increased to 5 mg daily. -Cardiology follow-up scheduled for 6 weeks

## 2022-12-04 ENCOUNTER — Encounter (HOSPITAL_COMMUNITY)
Admission: RE | Admit: 2022-12-04 | Discharge: 2022-12-04 | Disposition: A | Discharge status: Home / Self Care | Payer: Medicare Other | Source: Ambulatory Visit | Attending: Internal Medicine | Admitting: Internal Medicine

## 2022-12-04 ENCOUNTER — Encounter (HOSPITAL_COMMUNITY): Payer: Medicare Other

## 2022-12-04 ENCOUNTER — Other Ambulatory Visit (HOSPITAL_COMMUNITY): Payer: Self-pay | Admitting: Cardiology

## 2022-12-04 VITALS — Ht 62.5 in | Wt 138.4 lb

## 2022-12-04 DIAGNOSIS — Z23 Encounter for immunization: Secondary | ICD-10-CM | POA: Diagnosis not present

## 2022-12-04 DIAGNOSIS — I5022 Chronic systolic (congestive) heart failure: Secondary | ICD-10-CM | POA: Diagnosis not present

## 2022-12-04 NOTE — Patient Instructions (Signed)
Discharge Patient Instructions  Patient Details  Name: Heather [name] MRN: [id number] Date of Birth: August 21, 1940 Referring Provider:  [name], MD   Number of Visits: 76  Reason for Discharge:  Patient reached a stable level of exercise. Patient independent in their exercise. Patient has met program and personal goals.  Smoking History:  Social History   Tobacco Use  Smoking Status Former   Current packs/day: 0.00   Average packs/day: 1 pack/day for 40.0 years (40.0 ttl pk-yrs)   Types: Cigarettes   Start date: 03/20/1945   Quit date: 03/20/1985   Years since quitting: 37.7  Smokeless Tobacco Never    Diagnosis:  Heart failure, chronic systolic (HCC)  Initial Exercise Prescription:  Initial Exercise Prescription - 09/14/22 1400       Date of Initial Exercise RX and Referring Provider   Date 09/14/22    Referring Provider Luane School MD      Oxygen   Maintain Oxygen Saturation 88% or higher      Treadmill   MPH 1.3    Grade 0.5    Minutes 15    METs 2.08      NuStep   Level 1    SPM 80    Minutes 15    METs 2      Prescription Details   Frequency (times per week) 3    Duration Progress to 30 minutes of continuous aerobic without signs/symptoms of physical distress      Intensity   THRR 40-80% of Max Heartrate 94-124    Ratings of Perceived Exertion 11-13    Perceived Dyspnea 0-4      Progression   Progression Continue to progress workloads to maintain intensity without signs/symptoms of physical distress.      Resistance Training   Training Prescription Yes    Weight 2 lb    Reps 10-15             Discharge Exercise Prescription (Final Exercise Prescription Changes):  Exercise Prescription Changes - 11/29/22 1300       Response to Exercise   Blood Pressure (Admit) 132/74    Blood Pressure (Exit) 104/58    Heart Rate (Admit) 72 bpm    Heart Rate (Exercise) 100 bpm    Heart Rate (Exit) 74 bpm    Rating of Perceived  Exertion (Exercise) 13    Duration Continue with 30 min of aerobic exercise without signs/symptoms of physical distress.    Intensity THRR unchanged      Progression   Progression Continue to progress workloads to maintain intensity without signs/symptoms of physical distress.      Resistance Training   Training Prescription Yes    Weight 3 lbs    Reps 10-15      NuStep   Level 3    SPM 65    Minutes 15    METs 1.7      Track   Laps 19    Minutes 15    METs 1.9      Home Exercise Plan   Plans to continue exercise at Home (comment)    Frequency Add 2 additional days to program exercise sessions.      Oxygen   Maintain Oxygen Saturation 88% or higher             Functional Capacity:  6 Minute Walk     Row Name 09/14/22 1402 12/04/22 1613       6 Minute Walk   Phase Initial Discharge  Distance 828 feet 768 feet  recalculated initial distance 788 ft    Distance % Change -- -2.5 %    Distance Feet Change -- -20 ft    Walk Time 6 minutes 6 minutes    # of Rest Breaks 0 0    MPH 1.57 1.45    METS 1.54 1.46    RPE 11 13    VO2 Peak 5.3 5.12    Symptoms Yes (comment) No    Comments R hip pain 5/10 hip pain 8/10    Resting HR 63 bpm 65 bpm    Resting BP 122/62 104/56    Resting Oxygen Saturation  99 % --    Exercise Oxygen Saturation  during 6 min walk 96 % --    Max Ex. HR 89 bpm 88 bpm    Max Ex. BP 134/72 148/76    2 Minute Post BP 124/70 --           Nutrition & Weight - Outcomes:  Pre Biometrics - 09/14/22 1406       Pre Biometrics   Height 5' 2.5" (1.588 m)    Weight 61.6 kg    Waist Circumference 28 inches    Hip Circumference 34 inches    Waist to Hip Ratio 0.82 %    BMI (Calculated) 24.44    Triceps Skinfold 14 mm    % Body Fat 32.2 %    Grip Strength 17.1 kg    Single Leg Stand 5 seconds             Post Biometrics - 12/04/22 1614        Post  Biometrics   Height 5' 2.5" (1.588 m)    Weight 62.8 kg    Waist Circumference  31 inches    Hip Circumference 35.5 inches    Waist to Hip Ratio 0.87 %    BMI (Calculated) 24.89    Grip Strength 21.3 kg    Single Leg Stand 1.34 seconds

## 2022-12-04 NOTE — Progress Notes (Signed)
Daily Session Note  Patient Details  Name: FLOELLA ENSZ MRN: [id number] Date of Birth: 1940-10-08 Referring Provider:   Flowsheet Row CARDIAC REHAB PHASE II ORIENTATION from 09/14/2022 in [facility] CARDIAC REHABILITATION  Referring Provider Luane School MD       Encounter Date: 12/04/2022  Check In:  Session Check In - 12/04/22 0915       Check-In   Supervising physician immediately available to respond to emergencies See telemetry face sheet for immediately available MD    Location [facility]    Staff Present Ross Ludwig, BS, Exercise Physiologist;Jessica Juanetta Gosling, MA, RCEP, CCRP, Dow Adolph, RN, BSN    Virtual Visit No    Medication changes reported     No    Fall or balance concerns reported    No    Tobacco Cessation No Change    Warm-up and Cool-down Performed on first and last piece of equipment    Resistance Training Performed Yes    VAD Patient? No    PAD/SET Patient? No      Pain Assessment   Currently in Pain? No/denies    Pain Score 0-No pain    Multiple Pain Sites No             Capillary Blood Glucose: No results found for this or any previous visit (from the past 24 hour(s)).    Social History   Tobacco Use  Smoking Status Former   Current packs/day: 0.00   Average packs/day: 1 pack/day for 40.0 years (40.0 ttl pk-yrs)   Types: Cigarettes   Start date: 03/20/1945   Quit date: 03/20/1985   Years since quitting: 37.7  Smokeless Tobacco Never    Goals Met:  Independence with exercise equipment Exercise tolerated well No report of concerns or symptoms today Strength training completed today  Goals Unmet:  Not Applicable  Comments: Pt able to follow exercise prescription today without complaint.  Will continue to monitor for progression.    Dr. Dina Rich is Medical Director for [facility] Cardiac Rehab

## 2022-12-05 ENCOUNTER — Other Ambulatory Visit (HOSPITAL_COMMUNITY): Payer: Self-pay | Admitting: Cardiology

## 2022-12-05 ENCOUNTER — Telehealth (HOSPITAL_COMMUNITY): Payer: Self-pay | Admitting: Cardiology

## 2022-12-05 DIAGNOSIS — I5022 Chronic systolic (congestive) heart failure: Secondary | ICD-10-CM

## 2022-12-05 LAB — BASIC METABOLIC PANEL
BUN/Creatinine Ratio: 34 — ABNORMAL HIGH (ref 12–28)
BUN: 77 mg/dL (ref 8–27)
CO2: 19 mmol/L — ABNORMAL LOW (ref 20–29)
Calcium: 9.5 mg/dL (ref 8.7–10.3)
Chloride: 105 mmol/L (ref 96–106)
Creatinine, Ser: 2.29 mg/dL — ABNORMAL HIGH (ref 0.57–1.00)
Glucose: 85 mg/dL (ref 70–99)
Potassium: 4.9 mmol/L (ref 3.5–5.2)
Sodium: 142 mmol/L (ref 134–144)
eGFR: 21 mL/min/{1.73_m2} — ABNORMAL LOW (ref >59)

## 2022-12-05 MED ORDER — FUROSEMIDE 40 MG PO TABS: 20.0000 mg | ORAL_TABLET | ORAL | 3 refills | Status: DC

## 2022-12-05 MED ORDER — DIGOXIN 125 MCG PO TABS: 0.0625 mg | ORAL_TABLET | ORAL | 3 refills | Status: DC

## 2022-12-05 NOTE — Telephone Encounter (Signed)
-----   Message from Marca Ancona sent at 12/05/2022  2:12 PM EDT ----- Stop losartan.  Decrease Lasix to 20 mg every other day.  Decrease digoxin to 0.0625 mg every other day.  BMET and digoxin level in 1 week.

## 2022-12-05 NOTE — Telephone Encounter (Signed)
Patient called.  Patient aware.  

## 2022-12-06 ENCOUNTER — Encounter (HOSPITAL_COMMUNITY)
Admission: RE | Admit: 2022-12-06 | Discharge: 2022-12-06 | Disposition: A | Discharge status: Home / Self Care | Payer: Medicare Other | Source: Ambulatory Visit | Attending: Internal Medicine | Admitting: Internal Medicine

## 2022-12-06 ENCOUNTER — Encounter (HOSPITAL_COMMUNITY): Payer: Medicare Other

## 2022-12-06 DIAGNOSIS — I5022 Chronic systolic (congestive) heart failure: Secondary | ICD-10-CM | POA: Diagnosis not present

## 2022-12-06 MED ORDER — TRELEGY ELLIPTA 200-62.5-25 MCG/ACT IN AEPB: 1.0000 | INHALATION_SPRAY | Freq: Every day | RESPIRATORY_TRACT | 3 refills | Status: DC

## 2022-12-06 NOTE — Telephone Encounter (Signed)
Patient called to confirm that she will be picking up samples tomorrow at the [employer] location.

## 2022-12-06 NOTE — Telephone Encounter (Signed)
Spoke with patient.  Heather [name] does not have samples.  Script sent to pharmacy and samples at front in the [address] location for when patient can get here.

## 2022-12-06 NOTE — Progress Notes (Signed)
Daily Session Note  Patient Details  Name: Heather [name] MRN: [id number] Date of Birth: 06/29/1940 Referring Provider:   Flowsheet Row CARDIAC REHAB PHASE II ORIENTATION from 09/14/2022 in [facility] CARDIAC REHABILITATION  Referring Provider Luane School MD       Encounter Date: 12/06/2022  Check In:  Session Check In - 12/06/22 0918       Check-In   Supervising physician immediately available to respond to emergencies See telemetry face sheet for immediately available MD    Location [facility]    Staff Present Ross Ludwig, BS, Exercise Physiologist;Armondo Cech [address] BSN, RN;Jessica [place], MA, RCEP, CCRP, Dow Adolph, RN, BSN    Virtual Visit No    Medication changes reported     Yes    Comments Losartan stopped, Lasix 20 mg every other day, decrease Digoxin to a 1/2 tablet every other day (12/06/22)    Fall or balance concerns reported    No    Tobacco Cessation No Change    Warm-up and Cool-down Performed on first and last piece of equipment    Resistance Training Performed Yes    VAD Patient? No    PAD/SET Patient? No      Pain Assessment   Currently in Pain? No/denies    Pain Score 0-No pain    Multiple Pain Sites No             Capillary Blood Glucose: No results found for this or any previous visit (from the past 24 hour(s)).    Social History   Tobacco Use  Smoking Status Former   Current packs/day: 0.00   Average packs/day: 1 pack/day for 40.0 years (40.0 ttl pk-yrs)   Types: Cigarettes   Start date: 03/20/1945   Quit date: 03/20/1985   Years since quitting: 37.7  Smokeless Tobacco Never    Goals Met:  Independence with exercise equipment Exercise tolerated well No report of concerns or symptoms today Strength training completed today  Goals Unmet:  Not Applicable  Comments: Marland KitchenMarland KitchenPt able to follow exercise prescription today without complaint.  Will continue to monitor for progression.    Dr. Dina Rich  is Medical Director for [facility] Cardiac Rehab

## 2022-12-07 ENCOUNTER — Encounter: Payer: Self-pay | Admitting: Pulmonary Disease

## 2022-12-08 ENCOUNTER — Encounter (HOSPITAL_COMMUNITY): Payer: Medicare Other

## 2022-12-08 ENCOUNTER — Encounter (HOSPITAL_COMMUNITY)
Admission: RE | Admit: 2022-12-08 | Discharge: 2022-12-08 | Disposition: A | Discharge status: Home / Self Care | Payer: Medicare Other | Source: Ambulatory Visit | Attending: Internal Medicine | Admitting: Internal Medicine

## 2022-12-08 DIAGNOSIS — I5022 Chronic systolic (congestive) heart failure: Secondary | ICD-10-CM | POA: Diagnosis not present

## 2022-12-08 NOTE — Progress Notes (Signed)
Daily Session Note  Patient Details  Name: Heather [name] MRN: [id number] Date of Birth: 1940/11/07 Referring Provider:   Flowsheet Row CARDIAC REHAB PHASE II ORIENTATION from 09/14/2022 in [facility] CARDIAC REHABILITATION  Referring Provider Luane School MD       Encounter Date: 12/08/2022  Check In:  Session Check In - 12/08/22 0915       Check-In   Supervising physician immediately available to respond to emergencies See telemetry face sheet for immediately available ER MD    Location [facility]    Staff Present Rodena Medin, RN, BSN;Hillary Troutman BSN, RN;Jessica Buchanan, MA, RCEP, CCRP, CCET    Virtual Visit No    Medication changes reported     No    Fall or balance concerns reported    No    Warm-up and Cool-down Performed on first and last piece of equipment    Resistance Training Performed Yes    VAD Patient? No    PAD/SET Patient? No      Pain Assessment   Currently in Pain? No/denies    Pain Score 0-No pain    Multiple Pain Sites No             Capillary Blood Glucose: No results found for this or any previous visit (from the past 24 hour(s)).    Social History   Tobacco Use  Smoking Status Former   Current packs/day: 0.00   Average packs/day: 1 pack/day for 40.0 years (40.0 ttl pk-yrs)   Types: Cigarettes   Start date: 03/20/1945   Quit date: 03/20/1985   Years since quitting: 37.7  Smokeless Tobacco Never    Goals Met:  Independence with exercise equipment Exercise tolerated well No report of concerns or symptoms today Strength training completed today  Goals Unmet:  Not Applicable  Comments: Pt able to follow exercise prescription today without complaint.  Will continue to monitor for progression.    Dr. Dina Rich is Medical Director for [facility] Cardiac Rehab

## 2022-12-11 ENCOUNTER — Encounter (HOSPITAL_COMMUNITY)
Admission: RE | Admit: 2022-12-11 | Discharge: 2022-12-11 | Disposition: A | Discharge status: Home / Self Care | Payer: Medicare Other | Source: Ambulatory Visit | Attending: Internal Medicine

## 2022-12-11 ENCOUNTER — Encounter (HOSPITAL_COMMUNITY): Payer: Medicare Other

## 2022-12-11 DIAGNOSIS — I5022 Chronic systolic (congestive) heart failure: Secondary | ICD-10-CM

## 2022-12-11 DIAGNOSIS — I493 Ventricular premature depolarization: Secondary | ICD-10-CM | POA: Diagnosis not present

## 2022-12-11 NOTE — Progress Notes (Signed)
Daily Session Note  Patient Details  Name: Heather [name] MRN: [id number] Date of Birth: [birth year] Referring Provider:   Flowsheet Row CARDIAC REHAB PHASE II ORIENTATION from 09/14/2022 in [facility] CARDIAC [facility]ILITATION  Referring Provider Luane School MD       Encounter Date: 12/11/2022  Check In:  Session Check In - 12/11/22 0900       Check-In   Supervising physician immediately available to respond to emergencies See telemetry face sheet for immediately available ER MD    Location [facility]    Staff Present Fabio Pierce, MA, RCEP, CCRP, Dow Adolph, RN, Pleas Koch, RN, BSN    Virtual Visit No    Medication changes reported     No    Fall or balance concerns reported    No    Warm-up and Cool-down Performed on first and last piece of equipment    Resistance Training Performed Yes    VAD Patient? No    PAD/SET Patient? No      Pain Assessment   Currently in Pain? No/denies    Pain Score 0-No pain    Multiple Pain Sites No             Capillary Blood Glucose: No results found for this or any previous visit (from the past 24 hour(s)).    Social History   Tobacco Use  Smoking Status Former   Current packs/day: 0.00   Average packs/day: 1 pack/day for 40.0 years (40.0 ttl pk-yrs)   Types: Cigarettes   Start date: 03/20/1945   Quit date: 03/20/1985   Years since quitting: 37.7  Smokeless Tobacco Never    Goals Met:  Independence with exercise equipment Exercise tolerated well No report of concerns or symptoms today Strength training completed today  Goals Unmet:  Not Applicable  Comments: Pt able to follow exercise prescription today without complaint.  Will continue to monitor for progression.    Dr. Dina Rich is Medical Director for [facility]

## 2022-12-12 ENCOUNTER — Other Ambulatory Visit: Payer: Self-pay

## 2022-12-12 DIAGNOSIS — M1A9XX0 Chronic gout, unspecified, without tophus (tophi): Secondary | ICD-10-CM

## 2022-12-12 DIAGNOSIS — I5022 Chronic systolic (congestive) heart failure: Secondary | ICD-10-CM | POA: Diagnosis not present

## 2022-12-12 MED ORDER — ALLOPURINOL 300 MG PO TABS: 150.0000 mg | ORAL_TABLET | Freq: Every day | ORAL | 0 refills | Status: DC

## 2022-12-13 ENCOUNTER — Encounter (HOSPITAL_COMMUNITY)
Admission: RE | Admit: 2022-12-13 | Discharge: 2022-12-13 | Disposition: A | Discharge status: Home / Self Care | Payer: Medicare Other | Source: Ambulatory Visit | Attending: Internal Medicine | Admitting: Internal Medicine

## 2022-12-13 ENCOUNTER — Encounter (HOSPITAL_COMMUNITY): Payer: Self-pay | Admitting: *Deleted

## 2022-12-13 ENCOUNTER — Encounter (HOSPITAL_COMMUNITY): Payer: Medicare Other

## 2022-12-13 DIAGNOSIS — I5022 Chronic systolic (congestive) heart failure: Secondary | ICD-10-CM | POA: Diagnosis not present

## 2022-12-13 LAB — DIGOXIN LEVEL: Digoxin, Serum: 0.7 ng/mL (ref 0.5–0.9)

## 2022-12-13 LAB — BASIC METABOLIC PANEL
BUN/Creatinine Ratio: 34 — ABNORMAL HIGH (ref 12–28)
BUN: 52 mg/dL — ABNORMAL HIGH (ref 8–27)
CO2: 19 mmol/L — ABNORMAL LOW (ref 20–29)
Calcium: 9.6 mg/dL (ref 8.7–10.3)
Chloride: 106 mmol/L (ref 96–106)
Creatinine, Ser: 1.54 mg/dL — ABNORMAL HIGH (ref 0.57–1.00)
Glucose: 89 mg/dL (ref 70–99)
Potassium: 5 mmol/L (ref 3.5–5.2)
Sodium: 142 mmol/L (ref 134–144)
eGFR: 34 mL/min/{1.73_m2} — ABNORMAL LOW (ref >59)

## 2022-12-13 NOTE — Progress Notes (Signed)
Cardiac Individual Treatment Plan  Patient Details  Name: Heather [name] MRN: [id number] Date of Birth: [birth year] Referring Provider:   Flowsheet Row CARDIAC REHAB PHASE II ORIENTATION from 09/14/2022 in [facility] CARDIAC REHABILITATION  Referring Provider Luane School MD       Initial Encounter Date:  Flowsheet Row CARDIAC REHAB PHASE II ORIENTATION from 09/14/2022 in [place] CARDIAC REHABILITATION  Date 09/14/22       Visit Diagnosis: Heart failure, chronic systolic (HCC)  Patient's Home Medications on Admission:  Current Outpatient Medications:    acetaminophen (TYLENOL) 500 MG tablet, Take 500 mg by mouth every 6 (six) hours as needed for mild pain or moderate pain., Disp: , Rfl:    allopurinol (ZYLOPRIM) 300 MG tablet, Take 0.5 tablets (150 mg total) by mouth daily., Disp: 45 tablet, Rfl: 0   aspirin (ASPIRIN CHILDRENS) 81 MG chewable tablet, Chew 1 tablet (81 mg total) by mouth daily., Disp: 90 tablet, Rfl: 3   atorvastatin (LIPITOR) 20 MG tablet, Take 1 tablet (20 mg total) by mouth daily., Disp: 90 tablet, Rfl: 3   bisoprolol (ZEBETA) 5 MG tablet, Take 1 tablet (5 mg total) by mouth daily., Disp: 90 tablet, Rfl: 3   chlorpheniramine (CHLOR-TRIMETON) 4 MG tablet, Take 4 mg by mouth daily as needed for allergies., Disp: , Rfl:    Cholecalciferol (DIALYVITE VITAMIN D 5000 PO), Take 5,000 Units by mouth daily., Disp: , Rfl:    dapagliflozin propanediol (FARXIGA) 10 MG TABS tablet, Take 1 tablet (10 mg total) by mouth daily., Disp: 90 tablet, Rfl: 3   digoxin (LANOXIN) 0.125 MG tablet, Take 0.5 tablets (0.0625 mg total) by mouth every other day., Disp: 45 tablet, Rfl: 3   famotidine (PEPCID) 20 MG tablet, Take 1 tablet (20 mg total) by mouth at bedtime. One after supper, Disp: 30 tablet, Rfl: 3   Fluticasone-Umeclidin-Vilant (TRELEGY ELLIPTA) 200-62.5-25 MCG/ACT AEPB, Inhale 1 puff into the lungs daily., Disp: 60 each, Rfl: 3   furosemide (LASIX) 40 MG tablet, Take 0.5  tablets (20 mg total) by mouth every other day., Disp: 15 tablet, Rfl: 3   hydrALAZINE (APRESOLINE) 25 MG tablet, Take 0.5 tablets (12.5 mg total) by mouth 3 (three) times daily., Disp: 90 tablet, Rfl: 3   HYDROcodone-acetaminophen (NORCO/VICODIN) 5-325 MG tablet, Take 1 tablet by mouth every 6 (six) hours as needed for moderate pain., Disp: 120 tablet, Rfl: 0   isosorbide mononitrate (IMDUR) 30 MG 24 hr tablet, Take 0.5 tablets (15 mg total) by mouth daily., Disp: 45 tablet, Rfl: 3   ketoconazole (NIZORAL) 2 % cream, Apply 1 Application topically as needed for irritation., Disp: , Rfl:    Multiple Vitamins-Minerals (MULTIVITAMIN WITH MINERALS) tablet, Take 1 tablet by mouth daily., Disp: , Rfl:    PROAIR HFA 108 (90 Base) MCG/ACT inhaler, Inhale 2 puffs into the lungs every 6 (six) hours as needed for wheezing or shortness of breath., Disp: 18 g, Rfl: 2   Propylene Glycol (SYSTANE COMPLETE) 0.6 % SOLN, Place 1 drop into both eyes 2 (two) times daily., Disp: , Rfl:    spironolactone (ALDACTONE) 25 MG tablet, Take 1 tablet (25 mg total) by mouth daily., Disp: 30 tablet, Rfl: 5   venlafaxine XR (EFFEXOR-XR) 75 MG 24 hr capsule, Take 1 capsule (75 mg total) by mouth See admin instructions., Disp: 30 capsule, Rfl: 5  Past Medical History: Past Medical History:  Diagnosis Date   Anemia    Anxiety    Arthritis    COPD (chronic  obstructive pulmonary disease) (HCC)    CVA (cerebral vascular accident) (HCC)    Depression    Dry eyes 04/14/2014   GERD (gastroesophageal reflux disease)    Glaucoma    Gout    HTN (hypertension)    OSA (obstructive sleep apnea)     Tobacco Use: Social History   Tobacco Use  Smoking Status Former   Current packs/day: 0.00   Average packs/day: 1 pack/day for 40.0 years (40.0 ttl pk-yrs)   Types: Cigarettes   Start date: 03/20/1945   Quit date: 03/20/1985   Years since quitting: 37.7  Smokeless Tobacco Never    Labs: Review Flowsheet  More data exists       Latest Ref Rng & Units 03/18/2022 03/19/2022 03/20/2022 05/26/2022 08/17/2022  Labs for ITP Cardiac and Pulmonary Rehab  Cholestrol 0 - 200 mg/dL - - - 191  478   LDL (calc) 0 - 99 mg/dL - - - 82  81   HDL-C >29 mg/dL - - - 65  82   Trlycerides <150 mg/dL - - - 58  45   Hemoglobin A1c 4.8 - 5.6 % - - - 6.5  -  O2 Saturation % 78.3  72.2  59.1  - -    Details            Capillary Blood Glucose: Lab Results  Component Value Date   GLUCAP 73 09/29/2022   GLUCAP 129 (H) 09/29/2022   GLUCAP 79 09/25/2022   GLUCAP 100 (H) 09/25/2022   GLUCAP 83 09/18/2022     Exercise Target Goals: Exercise Program Goal: Individual exercise prescription set using results from initial 6 min walk test and THRR while considering  patient's activity barriers and safety.   Exercise Prescription Goal: Starting with aerobic activity 30 plus minutes a day, 3 days per week for initial exercise prescription. Provide home exercise prescription and guidelines that participant acknowledges understanding prior to discharge.  Activity Barriers & Risk Stratification:  Activity Barriers & Cardiac Risk Stratification - 09/14/22 1236       Activity Barriers & Cardiac Risk Stratification   Activity Barriers Left Hip Replacement;Right Hip Replacement;Right Knee Replacement;Joint Problems;Assistive Device;History of Falls;Balance Concerns   L shoulder replacement   Cardiac Risk Stratification High             6 Minute Walk:  6 Minute Walk     Row Name 09/14/22 1402 12/04/22 1613       6 Minute Walk   Phase Initial Discharge    Distance 828 feet 768 feet  recalculated initial distance 788 ft    Distance % Change -- -2.5 %    Distance Feet Change -- -20 ft    Walk Time 6 minutes 6 minutes    # of Rest Breaks 0 0    MPH 1.57 1.45    METS 1.54 1.46    RPE 11 13    VO2 Peak 5.3 5.12    Symptoms Yes (comment) No    Comments R hip pain 5/10 hip pain 8/10    Resting HR 63 bpm 65 bpm    Resting BP 122/62  104/56    Resting Oxygen Saturation  99 % --    Exercise Oxygen Saturation  during 6 min walk 96 % --    Max Ex. HR 89 bpm 88 bpm    Max Ex. BP 134/72 148/76    2 Minute Post BP 124/70 --  Oxygen Initial Assessment:   Oxygen Re-Evaluation:   Oxygen Discharge (Final Oxygen Re-Evaluation):   Initial Exercise Prescription:  Initial Exercise Prescription - 09/14/22 1400       Date of Initial Exercise RX and Referring Provider   Date 09/14/22    Referring Provider Luane School MD      Oxygen   Maintain Oxygen Saturation 88% or higher      Treadmill   MPH 1.3    Grade 0.5    Minutes 15    METs 2.08      NuStep   Level 1    SPM 80    Minutes 15    METs 2      Prescription Details   Frequency (times per week) 3    Duration Progress to 30 minutes of continuous aerobic without signs/symptoms of physical distress      Intensity   THRR 40-80% of Max Heartrate 94-124    Ratings of Perceived Exertion 11-13    Perceived Dyspnea 0-4      Progression   Progression Continue to progress workloads to maintain intensity without signs/symptoms of physical distress.      Resistance Training   Training Prescription Yes    Weight 2 lb    Reps 10-15             Perform Capillary Blood Glucose checks as needed.  Exercise Prescription Changes:   Exercise Prescription Changes     Row Name 09/14/22 1400 09/18/22 1300 10/02/22 1200 10/20/22 1000 10/27/22 0900     Response to Exercise   Blood Pressure (Admit) 122/62 124/62 100/58 100/60 --   Blood Pressure (Exercise) 134/72 120/64 -- -- --   Blood Pressure (Exit) 124/70 98/50 90/40  94/50 --   Heart Rate (Admit) 653 bpm 71 bpm 72 bpm -- --   Heart Rate (Exercise) 89 bpm 90 bpm 93 bpm 84 bpm --   Heart Rate (Exit) 69 bpm 68 bpm 75 bpm 66 bpm --   Oxygen Saturation (Admit) 99 % -- -- -- --   Oxygen Saturation (Exercise) 96 % -- -- -- --   Rating of Perceived Exertion (Exercise) 11 13 13 12  --    Symptoms R hip pain 5/10 -- -- -- --   Comments walk test results -- -- -- --   Duration -- Continue with 45 min of aerobic exercise without signs/symptoms of physical distress. Continue with 30 min of aerobic exercise without signs/symptoms of physical distress. Continue with 30 min of aerobic exercise without signs/symptoms of physical distress. --   Intensity -- THRR unchanged THRR unchanged THRR unchanged --     Progression   Progression -- Continue to progress workloads to maintain intensity without signs/symptoms of physical distress. Continue to progress workloads to maintain intensity without signs/symptoms of physical distress. Continue to progress workloads to maintain intensity without signs/symptoms of physical distress. --     Paramedic Prescription -- Yes Yes Yes --   Weight -- 2 3 4  --   Reps -- 10-15 10-15 10-15 --     Treadmill   MPH -- 1 1.3 -- --   Grade -- 0 0 -- --   Minutes -- 15 15 -- --   METs -- 1.77 1.99 -- --     NuStep   Level -- 1 2 2  --   SPM -- 55 67 69 --   Minutes -- 15 15 15  --   METs -- 1.6 1.7 1.8 --  Track   Laps -- -- -- 20 --   Minutes -- -- -- 15 --     Home Exercise Plan   Plans to continue exercise at -- -- -- -- Home (comment)  walking, recumbent bike   Frequency -- -- -- -- Add 2 additional days to program exercise sessions.   Initial Home Exercises Provided -- -- -- -- 10/27/22     Oxygen   Maintain Oxygen Saturation -- 88% or higher 88% or higher 88% or higher --    Row Name 11/13/22 1300 11/29/22 1300           Response to Exercise   Blood Pressure (Admit) 110/58 132/74      Blood Pressure (Exit) 102/60 104/58      Heart Rate (Admit) 65 bpm 72 bpm      Heart Rate (Exercise) 90 bpm 100 bpm      Heart Rate (Exit) 74 bpm 74 bpm      Rating of Perceived Exertion (Exercise) 13 13      Duration Continue with 30 min of aerobic exercise without signs/symptoms of physical distress. Continue with 30 min of  aerobic exercise without signs/symptoms of physical distress.      Intensity THRR unchanged THRR unchanged        Progression   Progression Continue to progress workloads to maintain intensity without signs/symptoms of physical distress. Continue to progress workloads to maintain intensity without signs/symptoms of physical distress.        Resistance Training   Training Prescription Yes Yes      Weight 3 3 lbs      Reps 10-15 10-15        NuStep   Level 2 3      SPM 80 65      Minutes 15 15      METs 1.9 1.7        Track   Laps 26 19      Minutes 15 15      METs -- 1.9        Home Exercise Plan   Plans to continue exercise at Home (comment) Home (comment)      Frequency Add 2 additional days to program exercise sessions. Add 2 additional days to program exercise sessions.        Oxygen   Maintain Oxygen Saturation 88% or higher 88% or higher               Exercise Comments:   Exercise Comments     Row Name 09/18/22 1000           Exercise Comments First full day of exercise!  Patient was oriented to gym and equipment including functions, settings, policies, and procedures.  Patient's individual exercise prescription and treatment plan were reviewed.  All starting workloads were established based on the results of the 6 minute walk test done at initial orientation visit.  The plan for exercise progression was also introduced and progression will be customized based on patient's performance and goals.                Exercise Goals and Review:   Exercise Goals     Row Name 09/14/22 1405 09/18/22 1323           Exercise Goals   Increase Physical Activity Yes Yes      Intervention Provide advice, education, support and counseling about physical activity/exercise needs.;Develop an individualized exercise prescription for aerobic and resistive training based on  initial evaluation findings, risk stratification, comorbidities and participant's personal goals.  Provide advice, education, support and counseling about physical activity/exercise needs.;Develop an individualized exercise prescription for aerobic and resistive training based on initial evaluation findings, risk stratification, comorbidities and participant's personal goals.      Expected Outcomes Short Term: Attend rehab on a regular basis to increase amount of physical activity.;Long Term: Exercising regularly at least 3-5 days a week.;Long Term: Add in home exercise to make exercise part of routine and to increase amount of physical activity. Short Term: Attend rehab on a regular basis to increase amount of physical activity.;Long Term: Exercising regularly at least 3-5 days a week.;Long Term: Add in home exercise to make exercise part of routine and to increase amount of physical activity.      Increase Strength and Stamina Yes Yes      Intervention Provide advice, education, support and counseling about physical activity/exercise needs.;Develop an individualized exercise prescription for aerobic and resistive training based on initial evaluation findings, risk stratification, comorbidities and participant's personal goals. Provide advice, education, support and counseling about physical activity/exercise needs.;Develop an individualized exercise prescription for aerobic and resistive training based on initial evaluation findings, risk stratification, comorbidities and participant's personal goals.      Expected Outcomes Short Term: Increase workloads from initial exercise prescription for resistance, speed, and METs.;Short Term: Perform resistance training exercises routinely during rehab and add in resistance training at home;Long Term: Improve cardiorespiratory fitness, muscular endurance and strength as measured by increased METs and functional capacity ( ) Short Term: Increase workloads from initial exercise prescription for resistance, speed, and METs.;Short Term: Perform resistance training  exercises routinely during rehab and add in resistance training at home;Long Term: Improve cardiorespiratory fitness, muscular endurance and strength as measured by increased METs and functional capacity ( )      Able to understand and use rate of perceived exertion (RPE) scale Yes Yes      Intervention Provide education and explanation on how to use RPE scale Provide education and explanation on how to use RPE scale      Expected Outcomes Short Term: Able to use RPE daily in rehab to express subjective intensity level;Long Term:  Able to use RPE to guide intensity level when exercising independently Short Term: Able to use RPE daily in rehab to express subjective intensity level;Long Term:  Able to use RPE to guide intensity level when exercising independently      Able to understand and use Dyspnea scale Yes Yes      Intervention Provide education and explanation on how to use Dyspnea scale Provide education and explanation on how to use Dyspnea scale      Expected Outcomes Short Term: Able to use Dyspnea scale daily in rehab to express subjective sense of shortness of breath during exertion;Long Term: Able to use Dyspnea scale to guide intensity level when exercising independently Short Term: Able to use Dyspnea scale daily in rehab to express subjective sense of shortness of breath during exertion;Long Term: Able to use Dyspnea scale to guide intensity level when exercising independently      Knowledge and understanding of Target Heart Rate Range (THRR) Yes Yes      Intervention Provide education and explanation of THRR including how the numbers were predicted and where they are located for reference Provide education and explanation of THRR including how the numbers were predicted and where they are located for reference      Expected Outcomes Short Term: Able to state/look up THRR;Short  Term: Able to use daily as guideline for intensity in rehab;Long Term: Able to use THRR to govern intensity when  exercising independently Short Term: Able to state/look up THRR;Short Term: Able to use daily as guideline for intensity in rehab;Long Term: Able to use THRR to govern intensity when exercising independently      Able to check pulse independently Yes Yes      Intervention Provide education and demonstration on how to check pulse in carotid and radial arteries.;Review the importance of being able to check your own pulse for safety during independent exercise Provide education and demonstration on how to check pulse in carotid and radial arteries.;Review the importance of being able to check your own pulse for safety during independent exercise      Expected Outcomes Long Term: Able to check pulse independently and accurately;Short Term: Able to explain why pulse checking is important during independent exercise Long Term: Able to check pulse independently and accurately;Short Term: Able to explain why pulse checking is important during independent exercise      Understanding of Exercise Prescription Yes Yes      Intervention Provide education, explanation, and written materials on patient's individual exercise prescription Provide education, explanation, and written materials on patient's individual exercise prescription      Expected Outcomes Short Term: Able to explain program exercise prescription;Long Term: Able to explain home exercise prescription to exercise independently Short Term: Able to explain program exercise prescription;Long Term: Able to explain home exercise prescription to exercise independently               Exercise Goals Re-Evaluation :  Exercise Goals Re-Evaluation     Row Name 09/14/22 1405 09/18/22 1323 10/03/22 0814 10/03/22 0815 10/04/22 0936     Exercise Goal Re-Evaluation   Exercise Goals Review Understanding of Exercise Prescription;Able to understand and use Dyspnea scale;Able to understand and use rate of perceived exertion (RPE) scale Increase Physical  Activity;Increase Strength and Stamina;Able to understand and use rate of perceived exertion (RPE) scale;Knowledge and understanding of Target Heart Rate Range (THRR);Able to check pulse independently;Understanding of Exercise Prescription -- -- Increase Physical Activity;Increase Strength and Stamina;Understanding of Exercise Prescription   Comments Reviewed RPE  and dyspnea scale and program prescription with pt today.  Pt voiced understanding and was given a copy of goals to take home. Pt has completed her first day in cardiac rehab. She did well for her first day in class learing the equipment. She is slightly deconditioned and started out slower on the treadmill and stepper. Informend the pt about achieving 80 SPM on the stepper for her first week. She is currently exercising at 1.77 METs on the treadmill. Will continue to monitor and progress as able, Pt is taking the first Pt is on 6th visit and has increased her level on the stepper and speed on the treadmill Pt feels like she has gotten a little stronger since starting rehab.  She is currently on 1.4 speed on the treadmill.  The pt has started using her exercise bike at home.  She exercised for 10 min on her bike yesterday, and she stated that she plans to increase this to a few days a week for 15 min each time.   Expected Outcomes Short: Use RPE daily to regulate intensity.  Long: Follow program prescription Through exercise at rehab and home, patient wll achieve their goals, -- -- Short term:  To go over home exercise goals with pt  Long term:  Pt will continue  exercising at home a few days a week    Row Name 10/20/22 1028 10/27/22 0948 11/24/22 0938 11/29/22 1352       Exercise Goal Re-Evaluation   Exercise Goals Review Increase Physical Activity;Increase Strength and Stamina;Understanding of Exercise Prescription Increase Physical Activity;Increase Strength and Stamina;Understanding of Exercise Prescription Increase Physical Activity;Increase  Strength and Stamina;Understanding of Exercise Prescription Increase Physical Activity;Increase Strength and Stamina;Understanding of Exercise Prescription    Comments Pt has not increased her level on the stepper in the last two week. She has been walking the track instead of the treadmill and has increased her laps each class. She is at 20 laps. Will continue to monitor and progress as able . Heather [name] is doing well in rehab.  She is not exercising at home.  She stays busy doing her chores and ADLs, but has not gotten on her bike yet.  Reviewed home exercise with pt today.  Pt plans to walk and use recumbent bike at home for exercise.  Reviewed THR, RPD, RPE, sign and symptoms, pulse oximetery and when to call 911 or MD.  Also discussed weather considerations and indoor options.  Pt voiced understanding.  She notes hip pain on rehab days when she walks.  We talked about using ice to help with pain managment. Heather [name] is doing well in rehab.  She is feeling good for most part. Today is a rough day and she is moving slowly, but overall much improved.  She continues to walk and use her bike at home.  She does feel like her stamina is improving. Heather [name] is tolerating exercise well. She continue to walk the track and is doing good finsihong 19-20 laps each sesison. She also increased her level on the NuStep to level 3. Will continue to monitor and progress as able.    Expected Outcomes Short term: Short: Start to walk or ride bike at home on off days Long: conitnue to improve stamina Short: Continue to walk on off days Long: Conitnue to build strength Short term: continue to exericse at current workloads and incrase when ready   long term: continue to attend rehab              Discharge Exercise Prescription (Final Exercise Prescription Changes):  Exercise Prescription Changes - 11/29/22 1300       Response to Exercise   Blood Pressure (Admit) 132/74    Blood Pressure (Exit) 104/58    Heart Rate (Admit) 72  bpm    Heart Rate (Exercise) 100 bpm    Heart Rate (Exit) 74 bpm    Rating of Perceived Exertion (Exercise) 13    Duration Continue with 30 min of aerobic exercise without signs/symptoms of physical distress.    Intensity THRR unchanged      Progression   Progression Continue to progress workloads to maintain intensity without signs/symptoms of physical distress.      Resistance Training   Training Prescription Yes    Weight 3 lbs    Reps 10-15      NuStep   Level 3    SPM 65    Minutes 15    METs 1.7      Track   Laps 19    Minutes 15    METs 1.9      Home Exercise Plan   Plans to continue exercise at Home (comment)    Frequency Add 2 additional days to program exercise sessions.      Oxygen   Maintain Oxygen Saturation 88%  or higher             Nutrition:  Target Goals: Understanding of nutrition guidelines, daily intake of sodium 1500mg , cholesterol 200mg , calories 30% from fat and 7% or less from saturated fats, daily to have 5 or more servings of fruits and vegetables.  Biometrics:  Pre Biometrics - 09/14/22 1406       Pre Biometrics   Height 5' 2.5" (1.588 m)    Weight 135 lb 14.4 oz (61.6 kg)    Waist Circumference 28 inches    Hip Circumference 34 inches    Waist to Hip Ratio 0.82 %    BMI (Calculated) 24.44    Triceps Skinfold 14 mm    % Body Fat 32.2 %    Grip Strength 17.1 kg    Single Leg Stand 5 seconds             Post Biometrics - 12/04/22 1614        Post  Biometrics   Height 5' 2.5" (1.588 m)    Weight 138 lb 6.4 oz (62.8 kg)    Waist Circumference 31 inches    Hip Circumference 35.5 inches    Waist to Hip Ratio 0.87 %    BMI (Calculated) 24.89    Grip Strength 21.3 kg    Single Leg Stand 1.34 seconds             Nutrition Therapy Plan and Nutrition Goals:  Nutrition Therapy & Goals - 09/14/22 1412       Intervention Plan   Intervention Prescribe, educate and counsel regarding individualized specific dietary  modifications aiming towards targeted core components such as weight, hypertension, lipid management, diabetes, heart failure and other comorbidities.;Nutrition handout(s) given to patient.    Expected Outcomes Short Term Goal: Understand basic principles of dietary content, such as calories, fat, sodium, cholesterol and nutrients.             Nutrition Assessments:  Nutrition Assessments - 09/14/22 1412       MEDFICTS Scores   Pre Score 49            MEDIFICTS Score Key: >=70 Need to make dietary changes  40-70 Heart Healthy Diet <= 40 Therapeutic Level Cholesterol Diet   Picture Your Plate Scores: <16 Unhealthy dietary pattern with much room for improvement. 41-50 Dietary pattern unlikely to meet recommendations for good health and room for improvement. 51-60 More healthful dietary pattern, with some room for improvement.  >60 Healthy dietary pattern, although there may be some specific behaviors that could be improved.    Nutrition Goals Re-Evaluation:  Nutrition Goals Re-Evaluation     Row Name 10/04/22 [phone] 10/27/22 0955 11/24/22 0942         Goals   Current Weight 138 lb 14.4 oz (63 kg) -- --     Nutrition Goal Heart healthy diet Short term: Pt will cut back on her sugar intake Long term: Focus on heart healthy eating Short: Continue to cut back on sugar Long: Continue to add in fruits and vegetables     Comment Pt tries to stay away from salty foods; however, she likes sweets and loves to bake.  She states that she has gained a few pounds and knows that she has to change her diet. Jee is doing well in rehab.  She is doing well with her diet.  She is still staying away from sugar and salt. Heather [name] is continuing to work on diet.  She has done really  well at cutting out salt, however, cutting back on sugar is still hard for her.  She is doing better with getting in more fruits and vegetables.  She is also trying to get enough protein so we talked about making sure  she has some protein with each meal.  She really likes cottage cheese but she was warned about the sodium in it.     Expected Outcome Short term:  Pt will cut back on her sugar intake  Long term:  Focus on heart healthy eating Short: Continue to cut back on sugar Long: Continue to add in fruits and vegetables Short: conitnue to find ways to add in protein Long: Continue to cut back on sugar              Nutrition Goals Discharge (Final Nutrition Goals Re-Evaluation):  Nutrition Goals Re-Evaluation - 11/24/22 0942       Goals   Nutrition Goal Short: Continue to cut back on sugar Long: Continue to add in fruits and vegetables    Comment Heather [name] is continuing to work on diet.  She has done really well at cutting out salt, however, cutting back on sugar is still hard for her.  She is doing better with getting in more fruits and vegetables.  She is also trying to get enough protein so we talked about making sure she has some protein with each meal.  She really likes cottage cheese but she was warned about the sodium in it.    Expected Outcome Short: conitnue to find ways to add in protein Long: Continue to cut back on sugar             Psychosocial: Target Goals: Acknowledge presence or absence of significant depression and/or stress, maximize coping skills, provide positive support system. Participant is able to verbalize types and ability to use techniques and skills needed for reducing stress and depression.  Initial Review & Psychosocial Screening:  Initial Psych Review & Screening - 09/14/22 1239       Initial Review   Current issues with Current Psychotropic Meds;History of Depression;Current Stress Concerns    Source of Stress Concerns Financial;Unable to participate in former interests or hobbies;Unable to perform yard/household activities    Comments letter from IRS about 2023 tax return not being filed, she is taking effexor for depression and feeling well managed currently,  sleeps well uses Sleepy Time Tea, heart failure has slowed her down wants to get back to her normal      Family Dynamics   Good Support System? Yes   has a friend she relies on, 3 children (2 sons in [place], daughter in [place]) they call often and sons come to check in     Barriers   Psychosocial barriers to participate in program Psychosocial barriers identified (see note);The patient should benefit from training in stress management and relaxation.      Screening Interventions   Interventions Encouraged to exercise;To provide support and resources with identified psychosocial needs;Provide feedback about the scores to participant    Expected Outcomes Short Term goal: Utilizing psychosocial counselor, staff and physician to assist with identification of specific Stressors or current issues interfering with healing process. Setting desired goal for each stressor or current issue identified.;Long Term Goal: Stressors or current issues are controlled or eliminated.;Short Term goal: Identification and review with participant of any Quality of Life or Depression concerns found by scoring the questionnaire.;Long Term goal: The participant improves quality of Life and PHQ9 Scores as seen  by post scores and/or verbalization of changes             Quality of Life Scores:  Quality of Life - 09/14/22 1411       Quality of Life   Select Quality of Life      Quality of Life Scores   Health/Function Pre 25 %    Socioeconomic Pre 24.08 %    Psych/Spiritual Pre 28 %    Family Pre 20.75 %    GLOBAL Pre 24.88 %            Scores of 19 and below usually indicate a poorer quality of life in these areas.  A difference of  2-3 points is a clinically meaningful difference.  A difference of 2-3 points in the total score of the Quality of Life Index has been associated with significant improvement in overall quality of life, self-image, physical symptoms, and general health in studies assessing change in  quality of life.  PHQ-9: Review Flowsheet  More data exists      12/01/2022 11/09/2022 10/19/2022 09/14/2022 08/31/2022  Depression screen PHQ 2/9  Decreased Interest 0 0 0 0 0  Down, Depressed, Hopeless 0 0 0 0 0  PHQ - 2 Score 0 0 0 0 0  Altered sleeping 3 - - 0 -  Tired, decreased energy 2 - - 1 -  Change in appetite 3 - - 1 -  Feeling bad or failure about yourself  0 - - 0 -  Trouble concentrating 0 - - 0 -  Moving slowly or fidgety/restless 0 - - 0 -  Suicidal thoughts 0 - - 0 -  PHQ-9 Score 8 - - 2 -  Difficult doing work/chores Not difficult at all - - Not difficult at all -    Details           Interpretation of Total Score  Total Score Depression Severity:  1-4 = Minimal depression, 5-9 = Mild depression, 10-14 = Moderate depression, 15-19 = Moderately severe depression, 20-27 = Severe depression   Psychosocial Evaluation and Intervention:  Psychosocial Evaluation - 09/14/22 1407       Psychosocial Evaluation & Interventions   Interventions Encouraged to exercise with the program and follow exercise prescription    Comments Heather [name] is coming into cardiac rehab for heart failure.  She has had two hospitalizations from heart failure over the past year.  She has not been able to go and do and exercise like she used to and would like to rebuild her strength and stamina.  She wants to get back to her normal again.  She lives alone, but has a "friend" and they check on each other frequently.  She has two sons in [place] that call regularly and come by to check in from time to time.  She also has a daughter in [place] that calls at least weekly.  She usually sleeps well using her Sleepy time tea at night.  Her biggest stressor other than her health is trying to figure her finances as she did not file taxes for 2023 thinking she didn't need to and got a letter telling her otherwise.  Overall she is a positive and happy person.  She does have a history of depression but PHQ showed  no current issues. She take effexor for her depression and is feeling well managed at this time.  She is excited to start program and does not preceive any  barriers to attending rehab.    Expected  Outcomes Short: Attend rehab to build stamina Long: Be able to go and do as she wants to do again    Continue Psychosocial Services  Follow up required by staff             Psychosocial Re-Evaluation:  Psychosocial Re-Evaluation     Row Name 10/04/22 0940 10/27/22 0953 11/24/22 0939         Psychosocial Re-Evaluation   Current issues with Current Stress Concerns;Current Psychotropic Meds Current Stress Concerns;Current Psychotropic Meds;Current Sleep Concerns Current Stress Concerns;Current Psychotropic Meds;Current Sleep Concerns     Comments Pt states her depression is being well managed with Effexor.  She has tried Sleepy Time Tea to help her sleep; however, she went to [employer] and purchased Melatonin which is working better for her.  Pt states she has no stressors at home at this time. Heather [name] is doing well mentally.  She is feeling good overall.  She has been sleeping better with the tea. She feels more rested with her better sleep now. Heather [name] is doing well mentally.  She continues to feel good overall.  She is still struggling with sleep.  The tea is helping some but this week she has been up late. She missed Wednesday as she did not get to sleep until 4am.  She is missing her sleep this week.  She is hoping to catch back up and feel better again.     Expected Outcomes Short term:  Pt's depression will continue to be managed with Effexor Long term:  Pt will have effective stress management Short: Exercise for mental boost Long: conitnue to use tea to help with sleep short: Try to sleep some over weekend Long: continue to improve sleep     Interventions Encouraged to attend Cardiac Rehabilitation for the exercise;Stress management education;Relaxation education Encouraged to attend Cardiac  Rehabilitation for the exercise Encouraged to attend Cardiac Rehabilitation for the exercise     Continue Psychosocial Services  Follow up required by staff Follow up required by staff Follow up required by staff       Initial Review   Source of Stress Concerns Financial;Unable to participate in former interests or hobbies;Unable to perform yard/household activities -- --              Psychosocial Discharge (Final Psychosocial Re-Evaluation):  Psychosocial Re-Evaluation - 11/24/22 0939       Psychosocial Re-Evaluation   Current issues with Current Stress Concerns;Current Psychotropic Meds;Current Sleep Concerns    Comments Ema is doing well mentally.  She continues to feel good overall.  She is still struggling with sleep.  The tea is helping some but this week she has been up late. She missed Wednesday as she did not get to sleep until 4am.  She is missing her sleep this week.  She is hoping to catch back up and feel better again.    Expected Outcomes short: Try to sleep some over weekend Long: continue to improve sleep    Interventions Encouraged to attend Cardiac Rehabilitation for the exercise    Continue Psychosocial Services  Follow up required by staff             Vocational Rehabilitation: Provide vocational rehab assistance to qualifying candidates.   Vocational Rehab Evaluation & Intervention:  Vocational Rehab - 09/14/22 1235       Initial Vocational Rehab Evaluation & Intervention   Assessment shows need for Vocational Rehabilitation No   retired  Education: Education Goals: Education classes will be provided on a weekly basis, covering required topics. Participant will state understanding/return demonstration of topics presented.  Learning Barriers/Preferences:  Learning Barriers/Preferences - 09/14/22 1234       Learning Barriers/Preferences   Learning Barriers Sight   glasses for reading   Learning Preferences Skilled  Demonstration;Written Material             Education Topics: Hypertension, Hypertension Reduction -Define heart disease and high blood pressure. Discus how high blood pressure affects the body and ways to reduce high blood pressure.   Exercise and Your Heart -Discuss why it is important to exercise, the FITT principles of exercise, normal and abnormal responses to exercise, and how to exercise safely. Flowsheet Row CARDIAC REHAB PHASE II EXERCISE from 12/06/2022 in [place] CARDIAC REHABILITATION  Date 11/29/22  Educator [facility]  Instruction Review Code 1- Verbalizes Understanding       Angina -Discuss definition of angina, causes of angina, treatment of angina, and how to decrease risk of having angina. Flowsheet Row CARDIAC REHAB PHASE II EXERCISE from 12/06/2022 in [place] CARDIAC REHABILITATION  Date 09/20/22  Educator [facility]  Instruction Review Code 1- Verbalizes Understanding       Cardiac Medications -Review what the following cardiac medications are used for, how they affect the body, and side effects that may occur when taking the medications.  Medications include Aspirin, Beta blockers, calcium channel blockers, ACE Inhibitors, angiotensin receptor blockers, diuretics, digoxin, and antihyperlipidemics. Flowsheet Row CARDIAC REHAB PHASE II EXERCISE from 12/06/2022 in [place] CARDIAC REHABILITATION  Date 11/01/22  Educator DJ  Instruction Review Code 1- Verbalizes Understanding       Congestive Heart Failure -Discuss the definition of CHF, how to live with CHF, the signs and symptoms of CHF, and how keep track of weight and sodium intake. Flowsheet Row CARDIAC REHAB PHASE II EXERCISE from 12/06/2022 in [place] CARDIAC REHABILITATION  Date 11/15/22  Educator HB  Instruction Review Code 1- Verbalizes Understanding       Heart Disease and Intimacy -Discus the effect sexual activity has on the heart, how changes occur during intimacy as we age, and safety  during sexual activity. Flowsheet Row CARDIAC REHAB PHASE II EXERCISE from 12/06/2022 in [place] CARDIAC REHABILITATION  Date 10/11/22  Educator [facility]  Instruction Review Code 1- Verbalizes Understanding       Smoking Cessation / COPD -Discuss different methods to quit smoking, the health benefits of quitting smoking, and the definition of COPD.   Nutrition I: Fats -Discuss the types of cholesterol, what cholesterol does to the heart, and how cholesterol levels can be controlled. Flowsheet Row CARDIAC REHAB PHASE II EXERCISE from 12/06/2022 in [place] CARDIAC REHABILITATION  Date 10/18/22  Educator [facility]  Instruction Review Code 1- Verbalizes Understanding       Nutrition II: Labels -Discuss the different components of food labels and how to read food label Flowsheet Row CARDIAC REHAB PHASE II EXERCISE from 12/06/2022 in [place] CARDIAC REHABILITATION  Date 10/18/22  Educator [facility]  Instruction Review Code 1- Verbalizes Understanding       Heart Parts/Heart Disease and PAD -Discuss the anatomy of the heart, the pathway of blood circulation through the heart, and these are affected by heart disease.   Stress I: Signs and Symptoms -Discuss the causes of stress, how stress may lead to anxiety and depression, and ways to limit stress. Flowsheet Row CARDIAC REHAB PHASE II EXERCISE from 12/06/2022 in [place] CARDIAC REHABILITATION  Date 10/25/22  Educator [facility]  Instruction Review Code 1- Verbalizes Understanding       Stress II: Relaxation -Discuss different types of relaxation techniques to limit stress. Flowsheet Row CARDIAC REHAB PHASE II EXERCISE from 12/06/2022 in [place] CARDIAC REHABILITATION  Date 10/25/22  Educator [facility]  Instruction Review Code 1- Verbalizes Understanding       Warning Signs of Stroke / TIA -Discuss definition of a stroke, what the signs and symptoms are of a stroke, and how to identify when someone is having stroke.   Knowledge  Questionnaire Score:  Knowledge Questionnaire Score - 09/14/22 1412       Knowledge Questionnaire Score   Pre Score 19/24             Core Components/Risk Factors/Patient Goals at Admission:  Personal Goals and Risk Factors at Admission - 09/14/22 1412       Core Components/Risk Factors/Patient Goals on Admission    Weight Management Yes;Weight Maintenance    Intervention Weight Management: Develop a combined nutrition and exercise program designed to reach desired caloric intake, while maintaining appropriate intake of nutrient and fiber, sodium and fats, and appropriate energy expenditure required for the weight goal.;Weight Management: Provide education and appropriate resources to help participant work on and attain dietary goals.    Admit Weight 135 lb 14.4 oz (61.6 kg)    Goal Weight: Short Term 135 lb (61.2 kg)    Goal Weight: Long Term 135 lb (61.2 kg)    Expected Outcomes Short Term: Continue to assess and modify interventions until short term weight is achieved;Long Term: Adherence to nutrition and physical activity/exercise program aimed toward attainment of established weight goal;Weight Maintenance: Understanding of the daily nutrition guidelines, which includes 25-35% calories from fat, 7% or less cal from saturated fats, less than 200mg  cholesterol, less than 1.5gm of sodium, & 5 or more servings of fruits and vegetables daily    Diabetes Yes    Intervention Provide education about signs/symptoms and action to take for hypo/hyperglycemia.;Provide education about proper nutrition, including hydration, and aerobic/resistive exercise prescription along with prescribed medications to achieve blood glucose in normal ranges: Fasting glucose 65-99 mg/dL    Expected Outcomes Short Term: Participant verbalizes understanding of the signs/symptoms and immediate care of hyper/hypoglycemia, proper foot care and importance of medication, aerobic/resistive exercise and nutrition plan for  blood glucose control.;Long Term: Attainment of HbA1C < 7%.    Heart Failure Yes    Intervention Provide a combined exercise and nutrition program that is supplemented with education, support and counseling about heart failure. Directed toward relieving symptoms such as shortness of breath, decreased exercise tolerance, and extremity edema.    Expected Outcomes Improve functional capacity of life;Short term: Attendance in program 2-3 days a week with increased exercise capacity. Reported lower sodium intake. Reported increased fruit and vegetable intake. Reports medication compliance.;Short term: Daily weights obtained and reported for increase. Utilizing diuretic protocols set by physician.;Long term: Adoption of self-care skills and reduction of barriers for early signs and symptoms recognition and intervention leading to self-care maintenance.    Hypertension Yes    Intervention Provide education on lifestyle modifcations including regular physical activity/exercise, weight management, moderate sodium restriction and increased consumption of fresh fruit, vegetables, and low fat dairy, alcohol moderation, and smoking cessation.;Monitor prescription use compliance.    Expected Outcomes Short Term: Continued assessment and intervention until BP is < 140/41mm HG in hypertensive participants. < 130/56mm HG in hypertensive participants with diabetes, heart failure or chronic kidney  disease.;Long Term: Maintenance of blood pressure at goal levels.    Lipids Yes    Intervention Provide education and support for participant on nutrition & aerobic/resistive exercise along with prescribed medications to achieve LDL 70mg , HDL >40mg .    Expected Outcomes Short Term: Participant states understanding of desired cholesterol values and is compliant with medications prescribed. Participant is following exercise prescription and nutrition guidelines.;Long Term: Cholesterol controlled with medications as prescribed, with  individualized exercise RX and with personalized nutrition plan. Value goals: LDL < 70mg , HDL > 40 mg.             Core Components/Risk Factors/Patient Goals Review:   Goals and Risk Factor Review     Row Name 10/04/22 0956 10/27/22 0957 11/24/22 0949         Core Components/Risk Factors/Patient Goals Review   Personal Goals Review Weight Management/Obesity;Heart Failure;Lipids;Diabetes;Hypertension Weight Management/Obesity;Heart Failure;Lipids;Diabetes;Hypertension Weight Management/Obesity;Heart Failure;Lipids;Diabetes;Hypertension     Review Pt states that she does not check her blood sugar at home and does not have a meter.  I spoke to the pt about asking her primary doctor for a prescription for a freestyle meter so that she can check her blood sugar daily.  She seemed very willing to ask her doctor about this so that she can monitor her blood sugar.  She does check her BP 2-3 times a week and weighs daily. Heather [name] is doing well in rehab.  Her weight is staying steady and she is feeling good overall.  She had an xray for her back and hip pain, but doctor could not tell what was causing her pain.  She has not been checking her sugars and not sure if she still needs to.  She is still taking farxiga, but it seems to be doing well. She has not had any heart failure symptoms recently. Her pressures are doing well overall. Heather [name] is doing well in rehab.  Her weight continues to stay steady.  Her blood sugars seem to be doing well as she has not had any swings.  She continues to do well from heart failure setting as she has not had any symptoms.  She is wearing her compression leggings each day.  She is supposed to start wearing oxygen at night and just waiting to get set up appointment made.     Expected Outcomes Short term:  Pt will have asked her doctor for a prescription for a freestyle meter  Long term:  Pt will be more proactive with her diabetes Short: Continue to montior heart failure  symptoms Long; Continue to monitor risk factors. Shrot: Get oxygen set up Long: Continue to monitor heart failure symptoms.              Core Components/Risk Factors/Patient Goals at Discharge (Final Review):   Goals and Risk Factor Review - 11/24/22 0949       Core Components/Risk Factors/Patient Goals Review   Personal Goals Review Weight Management/Obesity;Heart Failure;Lipids;Diabetes;Hypertension    Review Heather [name] is doing well in rehab.  Her weight continues to stay steady.  Her blood sugars seem to be doing well as she has not had any swings.  She continues to do well from heart failure setting as she has not had any symptoms.  She is wearing her compression leggings each day.  She is supposed to start wearing oxygen at night and just waiting to get set up appointment made.    Expected Outcomes Shrot: Get oxygen set up Long: Continue to monitor heart failure symptoms.  ITP Comments:  ITP Comments     Row Name 09/14/22 1401 09/18/22 1000 09/20/22 0843 10/09/22 1127 10/13/22 0720   ITP Comments Patient attend orientation today.  Patient is attendingCardiac Rehabilitation Program.  Documentation for diagnosis [facility] be found in [facility]18[facility].  Reviewed medical chart, RPE, gym safety, and program guidelines.  Patient was fitted to equipment they will be using during rehab.  Patient is scheduled to start exercise on 09/18/22. First full day of exercise!  Patient was oriented to gym and equipment including functions, settings, policies, and procedures.  Patient's individual exercise prescription and treatment plan were reviewed.  All starting workloads were established based on the results of the 6 minute walk test done at initial orientation visit.  The plan for exercise progression was also introduced and progression will be customized based on patient's performance and goals. 30 day review completed. ITP sent to Dr. Dina Rich, Medical Director of Cardiac Rehab.  Continue with ITP unless changes are made by physician.  New to program. Sent Dr Corinna Lines a note regarding low blood pressure with complaints of dizziness and not feeling right in her head at home occasionally. Dr. Jenene Slicker forwarded note to HF clinic. Brynda Peon, NP decreased her Hydralazine from 25 mg to 12.5 mg TID and decreased her Imdur from 30 mg to 15 mg daily. We will continue to montior her blood pressure and symptoms.    Row Name 10/18/22 0804 11/15/22 0759 12/13/22 0655       ITP Comments 30 day review completed. ITP sent to Dr. Dina Rich, Medical Director of Cardiac Rehab. Continue with ITP unless changes are made by physician. 30 day review completed. ITP sent to Dr. Dina Rich, Medical Director of Cardiac Rehab. Continue with ITP unless changes are made by physician. 30 day review completed. ITP sent to Dr. Dina Rich, Medical Director of Cardiac Rehab. Continue with ITP unless changes are made by physician.              Comments: 30 day review

## 2022-12-13 NOTE — Progress Notes (Signed)
Daily Session Note  Patient Details  Name: LAKESHA LEVINSON MRN: [id number] Date of Birth: June 27, 1940 Referring Provider:   Flowsheet Row CARDIAC REHAB PHASE II ORIENTATION from 09/14/2022 in [facility] CARDIAC REHABILITATION  Referring Provider Luane School MD       Encounter Date: 12/13/2022  Check In:  Session Check In - 12/13/22 1006       Check-In   Supervising physician immediately available to respond to emergencies See telemetry face sheet for immediately available MD    Location [facility]    Staff Present Ross Ludwig, BS, Exercise Physiologist;Alyzah Pelly [place], MA, RCEP, CCRP, Dow Adolph, RN, BSN    Virtual Visit No    Medication changes reported     No    Fall or balance concerns reported    No    Warm-up and Cool-down Performed on first and last piece of equipment    Resistance Training Performed Yes    VAD Patient? No    PAD/SET Patient? No      Pain Assessment   Currently in Pain? No/denies             Capillary Blood Glucose: Results for orders placed or performed in visit on 12/05/22 (from the past 24 hour(s))  Digoxin level     Status: None   Collection Time: 12/12/22 11:07 AM  Result Value Ref Range   Digoxin, Serum 0.7 0.5 - 0.9 ng/mL   Narrative   Performed at:  [address] [place], [place]  [id number] Lab Director: Jolene Schimke MD, Phone:  [phone]  Basic metabolic panel     Status: Abnormal   Collection Time: 12/12/22 11:10 AM  Result Value Ref Range   Glucose 89 70 - 99 mg/dL   BUN 52 (H) 8 - 27 mg/dL   Creatinine, Ser 8.46 (H) 0.57 - 1.00 mg/dL   eGFR 34 (L) >96 EX/BMW/4.13   BUN/Creatinine Ratio 34 (H) 12 - 28   Sodium 142 134 - 144 mmol/L   Potassium 5.0 3.5 - 5.2 mmol/L   Chloride 106 96 - 106 mmol/L   CO2 19 (L) 20 - 29 mmol/L   Calcium 9.6 8.7 - 10.3 mg/dL   Narrative   Performed at:  [address] [facility] [address] [place], [place]  [id number] Lab Director:  Jolene Schimke MD, Phone:  [phone]      Social History   Tobacco Use  Smoking Status Former   Current packs/day: 0.00   Average packs/day: 1 pack/day for 40.0 years (40.0 ttl pk-yrs)   Types: Cigarettes   Start date: 03/20/1945   Quit date: 03/20/1985   Years since quitting: 37.7  Smokeless Tobacco Never    Goals Met:  Independence with exercise equipment Exercise tolerated well Personal goals reviewed No report of concerns or symptoms today Strength training completed today  Goals Unmet:  Not Applicable  Comments:  Maylea graduated today from  rehab with 36 sessions completed.  Details of the patient's exercise prescription and what She needs to do in order to continue the prescription and progress were discussed with patient.  Patient was given a copy of prescription and goals.  Patient verbalized understanding. Talasia plans to continue to exercise by walking at home.    Dr. Dina Rich is Medical Director for [facility]

## 2022-12-13 NOTE — Progress Notes (Signed)
Cardiac Individual Treatment Plan  Patient Details  Name: SANAIA PINILLA MRN: [id number] Date of Birth: [birth year] Referring Provider:   Flowsheet Row CARDIAC REHAB PHASE II ORIENTATION from 09/14/2022 in [facility] CARDIAC REHABILITATION  Referring Provider Luane School MD       Initial Encounter Date:  Flowsheet Row CARDIAC REHAB PHASE II ORIENTATION from 09/14/2022 in [place] CARDIAC REHABILITATION  Date 09/14/22       Visit Diagnosis: Heart failure, chronic systolic (HCC)  Patient's Home Medications on Admission:  Current Outpatient Medications:    acetaminophen (TYLENOL) 500 MG tablet, Take 500 mg by mouth every 6 (six) hours as needed for mild pain or moderate pain., Disp: , Rfl:    allopurinol (ZYLOPRIM) 300 MG tablet, Take 0.5 tablets (150 mg total) by mouth daily., Disp: 45 tablet, Rfl: 0   aspirin (ASPIRIN CHILDRENS) 81 MG chewable tablet, Chew 1 tablet (81 mg total) by mouth daily., Disp: 90 tablet, Rfl: 3   atorvastatin (LIPITOR) 20 MG tablet, Take 1 tablet (20 mg total) by mouth daily., Disp: 90 tablet, Rfl: 3   bisoprolol (ZEBETA) 5 MG tablet, Take 1 tablet (5 mg total) by mouth daily., Disp: 90 tablet, Rfl: 3   chlorpheniramine (CHLOR-TRIMETON) 4 MG tablet, Take 4 mg by mouth daily as needed for allergies., Disp: , Rfl:    Cholecalciferol (DIALYVITE VITAMIN D 5000 PO), Take 5,000 Units by mouth daily., Disp: , Rfl:    dapagliflozin propanediol (FARXIGA) 10 MG TABS tablet, Take 1 tablet (10 mg total) by mouth daily., Disp: 90 tablet, Rfl: 3   digoxin (LANOXIN) 0.125 MG tablet, Take 0.5 tablets (0.0625 mg total) by mouth every other day., Disp: 45 tablet, Rfl: 3   famotidine (PEPCID) 20 MG tablet, Take 1 tablet (20 mg total) by mouth at bedtime. One after supper, Disp: 30 tablet, Rfl: 3   Fluticasone-Umeclidin-Vilant (TRELEGY ELLIPTA) 200-62.5-25 MCG/ACT AEPB, Inhale 1 puff into the lungs daily., Disp: 60 each, Rfl: 3   furosemide (LASIX) 40 MG tablet, Take 0.5  tablets (20 mg total) by mouth every other day., Disp: 15 tablet, Rfl: 3   hydrALAZINE (APRESOLINE) 25 MG tablet, Take 0.5 tablets (12.5 mg total) by mouth 3 (three) times daily., Disp: 90 tablet, Rfl: 3   HYDROcodone-acetaminophen (NORCO/VICODIN) 5-325 MG tablet, Take 1 tablet by mouth every 6 (six) hours as needed for moderate pain., Disp: 120 tablet, Rfl: 0   isosorbide mononitrate (IMDUR) 30 MG 24 hr tablet, Take 0.5 tablets (15 mg total) by mouth daily., Disp: 45 tablet, Rfl: 3   ketoconazole (NIZORAL) 2 % cream, Apply 1 Application topically as needed for irritation., Disp: , Rfl:    Multiple Vitamins-Minerals (MULTIVITAMIN WITH MINERALS) tablet, Take 1 tablet by mouth daily., Disp: , Rfl:    PROAIR HFA 108 (90 Base) MCG/ACT inhaler, Inhale 2 puffs into the lungs every 6 (six) hours as needed for wheezing or shortness of breath., Disp: 18 g, Rfl: 2   Propylene Glycol (SYSTANE COMPLETE) 0.6 % SOLN, Place 1 drop into both eyes 2 (two) times daily., Disp: , Rfl:    spironolactone (ALDACTONE) 25 MG tablet, Take 1 tablet (25 mg total) by mouth daily., Disp: 30 tablet, Rfl: 5   venlafaxine XR (EFFEXOR-XR) 75 MG 24 hr capsule, Take 1 capsule (75 mg total) by mouth See admin instructions., Disp: 30 capsule, Rfl: 5  Past Medical History: Past Medical History:  Diagnosis Date   Anemia    Anxiety    Arthritis    COPD (chronic  obstructive pulmonary disease) (HCC)    CVA (cerebral vascular accident) (HCC)    Depression    Dry eyes 04/14/2014   GERD (gastroesophageal reflux disease)    Glaucoma    Gout    HTN (hypertension)    OSA (obstructive sleep apnea)     Tobacco Use: Social History   Tobacco Use  Smoking Status Former   Current packs/day: 0.00   Average packs/day: 1 pack/day for 40.0 years (40.0 ttl pk-yrs)   Types: Cigarettes   Start date: 03/20/1945   Quit date: 03/20/1985   Years since quitting: 37.7  Smokeless Tobacco Never    Labs: Review Flowsheet  More data exists       Latest Ref Rng & Units 03/18/2022 03/19/2022 03/20/2022 05/26/2022 08/17/2022  Labs for ITP Cardiac and Pulmonary Rehab  Cholestrol 0 - 200 mg/dL - - - 782  956   LDL (calc) 0 - 99 mg/dL - - - 82  81   HDL-C >21 mg/dL - - - 65  82   Trlycerides <150 mg/dL - - - 58  45   Hemoglobin A1c 4.8 - 5.6 % - - - 6.5  -  O2 Saturation % 78.3  72.2  59.1  - -    Details            Capillary Blood Glucose: Lab Results  Component Value Date   GLUCAP 73 09/29/2022   GLUCAP 129 (H) 09/29/2022   GLUCAP 79 09/25/2022   GLUCAP 100 (H) 09/25/2022   GLUCAP 83 09/18/2022     Exercise Target Goals: Exercise Program Goal: Individual exercise prescription set using results from initial 6 min walk test and THRR while considering  patient's activity barriers and safety.   Exercise Prescription Goal: Starting with aerobic activity 30 plus minutes a day, 3 days per week for initial exercise prescription. Provide home exercise prescription and guidelines that participant acknowledges understanding prior to discharge.  Activity Barriers & Risk Stratification:  Activity Barriers & Cardiac Risk Stratification - 09/14/22 1236       Activity Barriers & Cardiac Risk Stratification   Activity Barriers Left Hip Replacement;Right Hip Replacement;Right Knee Replacement;Joint Problems;Assistive Device;History of Falls;Balance Concerns   L shoulder replacement   Cardiac Risk Stratification High             6 Minute Walk:  6 Minute Walk     Row Name 09/14/22 1402 12/04/22 1613       6 Minute Walk   Phase Initial Discharge    Distance 828 feet 768 feet  recalculated initial distance 788 ft    Distance % Change -- -2.5 %    Distance Feet Change -- -20 ft    Walk Time 6 minutes 6 minutes    # of Rest Breaks 0 0    MPH 1.57 1.45    METS 1.54 1.46    RPE 11 13    VO2 Peak 5.3 5.12    Symptoms Yes (comment) No    Comments R hip pain 5/10 hip pain 8/10    Resting HR 63 bpm 65 bpm    Resting BP 122/62  104/56    Resting Oxygen Saturation  99 % --    Exercise Oxygen Saturation  during 6 min walk 96 % --    Max Ex. HR 89 bpm 88 bpm    Max Ex. BP 134/72 148/76    2 Minute Post BP 124/70 --  Oxygen Initial Assessment:   Oxygen Re-Evaluation:   Oxygen Discharge (Final Oxygen Re-Evaluation):   Initial Exercise Prescription:  Initial Exercise Prescription - 09/14/22 1400       Date of Initial Exercise RX and Referring Provider   Date 09/14/22    Referring Provider Luane School MD      Oxygen   Maintain Oxygen Saturation 88% or higher      Treadmill   MPH 1.3    Grade 0.5    Minutes 15    METs 2.08      NuStep   Level 1    SPM 80    Minutes 15    METs 2      Prescription Details   Frequency (times per week) 3    Duration Progress to 30 minutes of continuous aerobic without signs/symptoms of physical distress      Intensity   THRR 40-80% of Max Heartrate 94-124    Ratings of Perceived Exertion 11-13    Perceived Dyspnea 0-4      Progression   Progression Continue to progress workloads to maintain intensity without signs/symptoms of physical distress.      Resistance Training   Training Prescription Yes    Weight 2 lb    Reps 10-15             Perform Capillary Blood Glucose checks as needed.  Exercise Prescription Changes:   Exercise Prescription Changes     Row Name 09/14/22 1400 09/18/22 1300 10/02/22 1200 10/20/22 1000 10/27/22 0900     Response to Exercise   Blood Pressure (Admit) 122/62 124/62 100/58 100/60 --   Blood Pressure (Exercise) 134/72 120/64 -- -- --   Blood Pressure (Exit) 124/70 98/50 90/40  94/50 --   Heart Rate (Admit) 653 bpm 71 bpm 72 bpm -- --   Heart Rate (Exercise) 89 bpm 90 bpm 93 bpm 84 bpm --   Heart Rate (Exit) 69 bpm 68 bpm 75 bpm 66 bpm --   Oxygen Saturation (Admit) 99 % -- -- -- --   Oxygen Saturation (Exercise) 96 % -- -- -- --   Rating of Perceived Exertion (Exercise) 11 13 13 12  --    Symptoms R hip pain 5/10 -- -- -- --   Comments walk test results -- -- -- --   Duration -- Continue with 45 min of aerobic exercise without signs/symptoms of physical distress. Continue with 30 min of aerobic exercise without signs/symptoms of physical distress. Continue with 30 min of aerobic exercise without signs/symptoms of physical distress. --   Intensity -- THRR unchanged THRR unchanged THRR unchanged --     Progression   Progression -- Continue to progress workloads to maintain intensity without signs/symptoms of physical distress. Continue to progress workloads to maintain intensity without signs/symptoms of physical distress. Continue to progress workloads to maintain intensity without signs/symptoms of physical distress. --     Paramedic Prescription -- Yes Yes Yes --   Weight -- 2 3 4  --   Reps -- 10-15 10-15 10-15 --     Treadmill   MPH -- 1 1.3 -- --   Grade -- 0 0 -- --   Minutes -- 15 15 -- --   METs -- 1.77 1.99 -- --     NuStep   Level -- 1 2 2  --   SPM -- 55 67 69 --   Minutes -- 15 15 15  --   METs -- 1.6 1.7 1.8 --  Track   Laps -- -- -- 20 --   Minutes -- -- -- 15 --     Home Exercise Plan   Plans to continue exercise at -- -- -- -- Home (comment)  walking, recumbent bike   Frequency -- -- -- -- Add 2 additional days to program exercise sessions.   Initial Home Exercises Provided -- -- -- -- 10/27/22     Oxygen   Maintain Oxygen Saturation -- 88% or higher 88% or higher 88% or higher --    Row Name 11/13/22 1300 11/29/22 1300           Response to Exercise   Blood Pressure (Admit) 110/58 132/74      Blood Pressure (Exit) 102/60 104/58      Heart Rate (Admit) 65 bpm 72 bpm      Heart Rate (Exercise) 90 bpm 100 bpm      Heart Rate (Exit) 74 bpm 74 bpm      Rating of Perceived Exertion (Exercise) 13 13      Duration Continue with 30 min of aerobic exercise without signs/symptoms of physical distress. Continue with 30 min of  aerobic exercise without signs/symptoms of physical distress.      Intensity THRR unchanged THRR unchanged        Progression   Progression Continue to progress workloads to maintain intensity without signs/symptoms of physical distress. Continue to progress workloads to maintain intensity without signs/symptoms of physical distress.        Resistance Training   Training Prescription Yes Yes      Weight 3 3 lbs      Reps 10-15 10-15        NuStep   Level 2 3      SPM 80 65      Minutes 15 15      METs 1.9 1.7        Track   Laps 26 19      Minutes 15 15      METs -- 1.9        Home Exercise Plan   Plans to continue exercise at Home (comment) Home (comment)      Frequency Add 2 additional days to program exercise sessions. Add 2 additional days to program exercise sessions.        Oxygen   Maintain Oxygen Saturation 88% or higher 88% or higher               Exercise Comments:   Exercise Comments     Row Name 09/18/22 1000 12/13/22 1008         Exercise Comments First full day of exercise!  Patient was oriented to gym and equipment including functions, settings, policies, and procedures.  Patient's individual exercise prescription and treatment plan were reviewed.  All starting workloads were established based on the results of the 6 minute walk test done at initial orientation visit.  The plan for exercise progression was also introduced and progression will be customized based on patient's performance and goals. Sriya graduated today from  rehab with 36 sessions completed.  Details of the patient's exercise prescription and what She needs to do in order to continue the prescription and progress were discussed with patient.  Patient was given a copy of prescription and goals.  Patient verbalized understanding. Marcell plans to continue to exercise by walking at home.               Exercise Goals and Review:  Exercise Goals     Row Name 09/14/22 1405 09/18/22  1323           Exercise Goals   Increase Physical Activity Yes Yes      Intervention Provide advice, education, support and counseling about physical activity/exercise needs.;Develop an individualized exercise prescription for aerobic and resistive training based on initial evaluation findings, risk stratification, comorbidities and participant's personal goals. Provide advice, education, support and counseling about physical activity/exercise needs.;Develop an individualized exercise prescription for aerobic and resistive training based on initial evaluation findings, risk stratification, comorbidities and participant's personal goals.      Expected Outcomes Short Term: Attend rehab on a regular basis to increase amount of physical activity.;Long Term: Exercising regularly at least 3-5 days a week.;Long Term: Add in home exercise to make exercise part of routine and to increase amount of physical activity. Short Term: Attend rehab on a regular basis to increase amount of physical activity.;Long Term: Exercising regularly at least 3-5 days a week.;Long Term: Add in home exercise to make exercise part of routine and to increase amount of physical activity.      Increase Strength and Stamina Yes Yes      Intervention Provide advice, education, support and counseling about physical activity/exercise needs.;Develop an individualized exercise prescription for aerobic and resistive training based on initial evaluation findings, risk stratification, comorbidities and participant's personal goals. Provide advice, education, support and counseling about physical activity/exercise needs.;Develop an individualized exercise prescription for aerobic and resistive training based on initial evaluation findings, risk stratification, comorbidities and participant's personal goals.      Expected Outcomes Short Term: Increase workloads from initial exercise prescription for resistance, speed, and METs.;Short Term: Perform  resistance training exercises routinely during rehab and add in resistance training at home;Long Term: Improve cardiorespiratory fitness, muscular endurance and strength as measured by increased METs and functional capacity ( ) Short Term: Increase workloads from initial exercise prescription for resistance, speed, and METs.;Short Term: Perform resistance training exercises routinely during rehab and add in resistance training at home;Long Term: Improve cardiorespiratory fitness, muscular endurance and strength as measured by increased METs and functional capacity ( )      Able to understand and use rate of perceived exertion (RPE) scale Yes Yes      Intervention Provide education and explanation on how to use RPE scale Provide education and explanation on how to use RPE scale      Expected Outcomes Short Term: Able to use RPE daily in rehab to express subjective intensity level;Long Term:  Able to use RPE to guide intensity level when exercising independently Short Term: Able to use RPE daily in rehab to express subjective intensity level;Long Term:  Able to use RPE to guide intensity level when exercising independently      Able to understand and use Dyspnea scale Yes Yes      Intervention Provide education and explanation on how to use Dyspnea scale Provide education and explanation on how to use Dyspnea scale      Expected Outcomes Short Term: Able to use Dyspnea scale daily in rehab to express subjective sense of shortness of breath during exertion;Long Term: Able to use Dyspnea scale to guide intensity level when exercising independently Short Term: Able to use Dyspnea scale daily in rehab to express subjective sense of shortness of breath during exertion;Long Term: Able to use Dyspnea scale to guide intensity level when exercising independently      Knowledge and understanding of Target Heart Rate Range (THRR) Yes Yes  Intervention Provide education and explanation of THRR including how the  numbers were predicted and where they are located for reference Provide education and explanation of THRR including how the numbers were predicted and where they are located for reference      Expected Outcomes Short Term: Able to state/look up THRR;Short Term: Able to use daily as guideline for intensity in rehab;Long Term: Able to use THRR to govern intensity when exercising independently Short Term: Able to state/look up THRR;Short Term: Able to use daily as guideline for intensity in rehab;Long Term: Able to use THRR to govern intensity when exercising independently      Able to check pulse independently Yes Yes      Intervention Provide education and demonstration on how to check pulse in carotid and radial arteries.;Review the importance of being able to check your own pulse for safety during independent exercise Provide education and demonstration on how to check pulse in carotid and radial arteries.;Review the importance of being able to check your own pulse for safety during independent exercise      Expected Outcomes Long Term: Able to check pulse independently and accurately;Short Term: Able to explain why pulse checking is important during independent exercise Long Term: Able to check pulse independently and accurately;Short Term: Able to explain why pulse checking is important during independent exercise      Understanding of Exercise Prescription Yes Yes      Intervention Provide education, explanation, and written materials on patient's individual exercise prescription Provide education, explanation, and written materials on patient's individual exercise prescription      Expected Outcomes Short Term: Able to explain program exercise prescription;Long Term: Able to explain home exercise prescription to exercise independently Short Term: Able to explain program exercise prescription;Long Term: Able to explain home exercise prescription to exercise independently               Exercise  Goals Re-Evaluation :  Exercise Goals Re-Evaluation     Row Name 09/14/22 1405 09/18/22 1323 10/03/22 0814 10/03/22 0815 10/04/22 0936     Exercise Goal Re-Evaluation   Exercise Goals Review Understanding of Exercise Prescription;Able to understand and use Dyspnea scale;Able to understand and use rate of perceived exertion (RPE) scale Increase Physical Activity;Increase Strength and Stamina;Able to understand and use rate of perceived exertion (RPE) scale;Knowledge and understanding of Target Heart Rate Range (THRR);Able to check pulse independently;Understanding of Exercise Prescription -- -- Increase Physical Activity;Increase Strength and Stamina;Understanding of Exercise Prescription   Comments Reviewed RPE  and dyspnea scale and program prescription with pt today.  Pt voiced understanding and was given a copy of goals to take home. Pt has completed her first day in cardiac rehab. She did well for her first day in class learing the equipment. She is slightly deconditioned and started out slower on the treadmill and stepper. Informend the pt about achieving 80 SPM on the stepper for her first week. She is currently exercising at 1.77 METs on the treadmill. Will continue to monitor and progress as able, Pt is taking the first Pt is on 6th visit and has increased her level on the stepper and speed on the treadmill Pt feels like she has gotten a little stronger since starting rehab.  She is currently on 1.4 speed on the treadmill.  The pt has started using her exercise bike at home.  She exercised for 10 min on her bike yesterday, and she stated that she plans to increase this to a few days a  week for 15 min each time.   Expected Outcomes Short: Use RPE daily to regulate intensity.  Long: Follow program prescription Through exercise at rehab and home, patient wll achieve their goals, -- -- Short term:  To go over home exercise goals with pt  Long term:  Pt will continue exercising at home a few days a week     Row Name 10/20/22 1028 10/27/22 0948 11/24/22 0938 11/29/22 1352       Exercise Goal Re-Evaluation   Exercise Goals Review Increase Physical Activity;Increase Strength and Stamina;Understanding of Exercise Prescription Increase Physical Activity;Increase Strength and Stamina;Understanding of Exercise Prescription Increase Physical Activity;Increase Strength and Stamina;Understanding of Exercise Prescription Increase Physical Activity;Increase Strength and Stamina;Understanding of Exercise Prescription    Comments Pt has not increased her level on the stepper in the last two week. She has been walking the track instead of the treadmill and has increased her laps each class. She is at 20 laps. Will continue to monitor and progress as able . Juliza is doing well in rehab.  She is not exercising at home.  She stays busy doing her chores and ADLs, but has not gotten on her bike yet.  Reviewed home exercise with pt today.  Pt plans to walk and use recumbent bike at home for exercise.  Reviewed THR, RPD, RPE, sign and symptoms, pulse oximetery and when to call 911 or MD.  Also discussed weather considerations and indoor options.  Pt voiced understanding.  She notes hip pain on rehab days when she walks.  We talked about using ice to help with pain managment. Rima is doing well in rehab.  She is feeling good for most part. Today is a rough day and she is moving slowly, but overall much improved.  She continues to walk and use her bike at home.  She does feel like her stamina is improving. Jeneal is tolerating exercise well. She continue to walk the track and is doing good finsihong 19-20 laps each sesison. She also increased her level on the NuStep to level 3. Will continue to monitor and progress as able.    Expected Outcomes Short term: Short: Start to walk or ride bike at home on off days Long: conitnue to improve stamina Short: Continue to walk on off days Long: Conitnue to build strength Short term:  continue to exericse at current workloads and incrase when ready   long term: continue to attend rehab              Discharge Exercise Prescription (Final Exercise Prescription Changes):  Exercise Prescription Changes - 11/29/22 1300       Response to Exercise   Blood Pressure (Admit) 132/74    Blood Pressure (Exit) 104/58    Heart Rate (Admit) 72 bpm    Heart Rate (Exercise) 100 bpm    Heart Rate (Exit) 74 bpm    Rating of Perceived Exertion (Exercise) 13    Duration Continue with 30 min of aerobic exercise without signs/symptoms of physical distress.    Intensity THRR unchanged      Progression   Progression Continue to progress workloads to maintain intensity without signs/symptoms of physical distress.      Resistance Training   Training Prescription Yes    Weight 3 lbs    Reps 10-15      NuStep   Level 3    SPM 65    Minutes 15    METs 1.7      Track  Laps 19    Minutes 15    METs 1.9      Home Exercise Plan   Plans to continue exercise at Home (comment)    Frequency Add 2 additional days to program exercise sessions.      Oxygen   Maintain Oxygen Saturation 88% or higher             Nutrition:  Target Goals: Understanding of nutrition guidelines, daily intake of sodium 1500mg , cholesterol 200mg , calories 30% from fat and 7% or less from saturated fats, daily to have 5 or more servings of fruits and vegetables.  Biometrics:  Pre Biometrics - 09/14/22 1406       Pre Biometrics   Height 5' 2.5" (1.588 m)    Weight 61.6 kg    Waist Circumference 28 inches    Hip Circumference 34 inches    Waist to Hip Ratio 0.82 %    BMI (Calculated) 24.44    Triceps Skinfold 14 mm    % Body Fat 32.2 %    Grip Strength 17.1 kg    Single Leg Stand 5 seconds             Post Biometrics - 12/04/22 1614        Post  Biometrics   Height 5' 2.5" (1.588 m)    Weight 62.8 kg    Waist Circumference 31 inches    Hip Circumference 35.5 inches    Waist  to Hip Ratio 0.87 %    BMI (Calculated) 24.89    Grip Strength 21.3 kg    Single Leg Stand 1.34 seconds             Nutrition Therapy Plan and Nutrition Goals:  Nutrition Therapy & Goals - 09/14/22 1412       Intervention Plan   Intervention Prescribe, educate and counsel regarding individualized specific dietary modifications aiming towards targeted core components such as weight, hypertension, lipid management, diabetes, heart failure and other comorbidities.;Nutrition handout(s) given to patient.    Expected Outcomes Short Term Goal: Understand basic principles of dietary content, such as calories, fat, sodium, cholesterol and nutrients.             Nutrition Assessments:  Nutrition Assessments - 09/14/22 1412       MEDFICTS Scores   Pre Score 49            MEDIFICTS Score Key: >=70 Need to make dietary changes  40-70 Heart Healthy Diet <= 40 Therapeutic Level Cholesterol Diet   Picture Your Plate Scores: <16 Unhealthy dietary pattern with much room for improvement. 41-50 Dietary pattern unlikely to meet recommendations for good health and room for improvement. 51-60 More healthful dietary pattern, with some room for improvement.  >60 Healthy dietary pattern, although there may be some specific behaviors that could be improved.    Nutrition Goals Re-Evaluation:  Nutrition Goals Re-Evaluation     Row Name 10/04/22 [id number] 10/27/22 0955 11/24/22 0942         Goals   Current Weight 138 lb 14.4 oz (63 kg) -- --     Nutrition Goal Heart healthy diet Short term: Pt will cut back on her sugar intake Long term: Focus on heart healthy eating Short: Continue to cut back on sugar Long: Continue to add in fruits and vegetables     Comment Pt tries to stay away from salty foods; however, she likes sweets and loves to bake.  She states that she has gained a few pounds  and knows that she has to change her diet. Akyla is doing well in rehab.  She is doing well with her  diet.  She is still staying away from sugar and salt. Yoali is continuing to work on diet.  She has done really well at cutting out salt, however, cutting back on sugar is still hard for her.  She is doing better with getting in more fruits and vegetables.  She is also trying to get enough protein so we talked about making sure she has some protein with each meal.  She really likes cottage cheese but she was warned about the sodium in it.     Expected Outcome Short term:  Pt will cut back on her sugar intake  Long term:  Focus on heart healthy eating Short: Continue to cut back on sugar Long: Continue to add in fruits and vegetables Short: conitnue to find ways to add in protein Long: Continue to cut back on sugar              Nutrition Goals Discharge (Final Nutrition Goals Re-Evaluation):  Nutrition Goals Re-Evaluation - 11/24/22 0942       Goals   Nutrition Goal Short: Continue to cut back on sugar Long: Continue to add in fruits and vegetables    Comment Anishia is continuing to work on diet.  She has done really well at cutting out salt, however, cutting back on sugar is still hard for her.  She is doing better with getting in more fruits and vegetables.  She is also trying to get enough protein so we talked about making sure she has some protein with each meal.  She really likes cottage cheese but she was warned about the sodium in it.    Expected Outcome Short: conitnue to find ways to add in protein Long: Continue to cut back on sugar             Psychosocial: Target Goals: Acknowledge presence or absence of significant depression and/or stress, maximize coping skills, provide positive support system. Participant is able to verbalize types and ability to use techniques and skills needed for reducing stress and depression.  Initial Review & Psychosocial Screening:  Initial Psych Review & Screening - 09/14/22 1239       Initial Review   Current issues with Current Psychotropic  Meds;History of Depression;Current Stress Concerns    Source of Stress Concerns Financial;Unable to participate in former interests or hobbies;Unable to perform yard/household activities    Comments letter from IRS about 2023 tax return not being filed, she is taking effexor for depression and feeling well managed currently, sleeps well uses Sleepy Time Tea, heart failure has slowed her down wants to get back to her normal      Family Dynamics   Good Support System? Yes   has a friend she relies on, 3 children (2 sons in [place], daughter in [place]) they call often and sons come to check in     Barriers   Psychosocial barriers to participate in program Psychosocial barriers identified (see note);The patient should benefit from training in stress management and relaxation.      Screening Interventions   Interventions Encouraged to exercise;To provide support and resources with identified psychosocial needs;Provide feedback about the scores to participant    Expected Outcomes Short Term goal: Utilizing psychosocial counselor, staff and physician to assist with identification of specific Stressors or current issues interfering with healing process. Setting desired goal for each stressor or current  issue identified.;Long Term Goal: Stressors or current issues are controlled or eliminated.;Short Term goal: Identification and review with participant of any Quality of Life or Depression concerns found by scoring the questionnaire.;Long Term goal: The participant improves quality of Life and PHQ9 Scores as seen by post scores and/or verbalization of changes             Quality of Life Scores:  Quality of Life - 09/14/22 1411       Quality of Life   Select Quality of Life      Quality of Life Scores   Health/Function Pre 25 %    Socioeconomic Pre 24.08 %    Psych/Spiritual Pre 28 %    Family Pre 20.75 %    GLOBAL Pre 24.88 %            Scores of 19 and below usually indicate a poorer quality of  life in these areas.  A difference of  2-3 points is a clinically meaningful difference.  A difference of 2-3 points in the total score of the Quality of Life Index has been associated with significant improvement in overall quality of life, self-image, physical symptoms, and general health in studies assessing change in quality of life.  PHQ-9: Review Flowsheet  More data exists      12/01/2022 11/09/2022 10/19/2022 09/14/2022 08/31/2022  Depression screen PHQ 2/9  Decreased Interest 0 0 0 0 0  Down, Depressed, Hopeless 0 0 0 0 0  PHQ - 2 Score 0 0 0 0 0  Altered sleeping 3 - - 0 -  Tired, decreased energy 2 - - 1 -  Change in appetite 3 - - 1 -  Feeling bad or failure about yourself  0 - - 0 -  Trouble concentrating 0 - - 0 -  Moving slowly or fidgety/restless 0 - - 0 -  Suicidal thoughts 0 - - 0 -  PHQ-9 Score 8 - - 2 -  Difficult doing work/chores Not difficult at all - - Not difficult at all -    Details           Interpretation of Total Score  Total Score Depression Severity:  1-4 = Minimal depression, 5-9 = Mild depression, 10-14 = Moderate depression, 15-19 = Moderately severe depression, 20-27 = Severe depression   Psychosocial Evaluation and Intervention:  Psychosocial Evaluation - 09/14/22 1407       Psychosocial Evaluation & Interventions   Interventions Encouraged to exercise with the program and follow exercise prescription    Comments Corenna is coming into cardiac rehab for heart failure.  She has had two hospitalizations from heart failure over the past year.  She has not been able to go and do and exercise like she used to and would like to rebuild her strength and stamina.  She wants to get back to her normal again.  She lives alone, but has a "friend" and they check on each other frequently.  She has two sons in [place] that call regularly and come by to check in from time to time.  She also has a daughter in [place] that calls at least weekly.  She usually  sleeps well using her Sleepy time tea at night.  Her biggest stressor other than her health is trying to figure her finances as she did not file taxes for 2023 thinking she didn't need to and got a letter telling her otherwise.  Overall she is a positive and happy person.  She does have  a history of depression but PHQ showed no current issues. She take effexor for her depression and is feeling well managed at this time.  She is excited to start program and does not preceive any  barriers to attending rehab.    Expected Outcomes Short: Attend rehab to build stamina Long: Be able to go and do as she wants to do again    Continue Psychosocial Services  Follow up required by staff             Psychosocial Re-Evaluation:  Psychosocial Re-Evaluation     Row Name 10/04/22 0940 10/27/22 0953 11/24/22 0939         Psychosocial Re-Evaluation   Current issues with Current Stress Concerns;Current Psychotropic Meds Current Stress Concerns;Current Psychotropic Meds;Current Sleep Concerns Current Stress Concerns;Current Psychotropic Meds;Current Sleep Concerns     Comments Pt states her depression is being well managed with Effexor.  She has tried Sleepy Time Tea to help her sleep; however, she went to [employer] and purchased Melatonin which is working better for her.  Pt states she has no stressors at home at this time. Diannie is doing well mentally.  She is feeling good overall.  She has been sleeping better with the tea. She feels more rested with her better sleep now. Genetha is doing well mentally.  She continues to feel good overall.  She is still struggling with sleep.  The tea is helping some but this week she has been up late. She missed Wednesday as she did not get to sleep until 4am.  She is missing her sleep this week.  She is hoping to catch back up and feel better again.     Expected Outcomes Short term:  Pt's depression will continue to be managed with Effexor Long term:  Pt will have effective  stress management Short: Exercise for mental boost Long: conitnue to use tea to help with sleep short: Try to sleep some over weekend Long: continue to improve sleep     Interventions Encouraged to attend Cardiac Rehabilitation for the exercise;Stress management education;Relaxation education Encouraged to attend Cardiac Rehabilitation for the exercise Encouraged to attend Cardiac Rehabilitation for the exercise     Continue Psychosocial Services  Follow up required by staff Follow up required by staff Follow up required by staff       Initial Review   Source of Stress Concerns Financial;Unable to participate in former interests or hobbies;Unable to perform yard/household activities -- --              Psychosocial Discharge (Final Psychosocial Re-Evaluation):  Psychosocial Re-Evaluation - 11/24/22 0939       Psychosocial Re-Evaluation   Current issues with Current Stress Concerns;Current Psychotropic Meds;Current Sleep Concerns    Comments Mahati is doing well mentally.  She continues to feel good overall.  She is still struggling with sleep.  The tea is helping some but this week she has been up late. She missed Wednesday as she did not get to sleep until 4am.  She is missing her sleep this week.  She is hoping to catch back up and feel better again.    Expected Outcomes short: Try to sleep some over weekend Long: continue to improve sleep    Interventions Encouraged to attend Cardiac Rehabilitation for the exercise    Continue Psychosocial Services  Follow up required by staff             Vocational Rehabilitation: Provide vocational rehab assistance to qualifying candidates.  Vocational Rehab Evaluation & Intervention:  Vocational Rehab - 09/14/22 1235       Initial Vocational Rehab Evaluation & Intervention   Assessment shows need for Vocational Rehabilitation No   retired            Education: Education Goals: Education classes will be provided on a weekly basis,  covering required topics. Participant will state understanding/return demonstration of topics presented.  Learning Barriers/Preferences:  Learning Barriers/Preferences - 09/14/22 1234       Learning Barriers/Preferences   Learning Barriers Sight   glasses for reading   Learning Preferences Skilled Demonstration;Written Material             Education Topics: Hypertension, Hypertension Reduction -Define heart disease and high blood pressure. Discus how high blood pressure affects the body and ways to reduce high blood pressure.   Exercise and Your Heart -Discuss why it is important to exercise, the FITT principles of exercise, normal and abnormal responses to exercise, and how to exercise safely. Flowsheet Row CARDIAC REHAB PHASE II EXERCISE from 12/13/2022 in [place] CARDIAC REHABILITATION  Date 11/29/22  [12/13/22 part 2]  Educator [facility]  Instruction Review Code 1- Verbalizes Understanding       Angina -Discuss definition of angina, causes of angina, treatment of angina, and how to decrease risk of having angina. Flowsheet Row CARDIAC REHAB PHASE II EXERCISE from 12/13/2022 in [place] CARDIAC REHABILITATION  Date 09/20/22  Educator [facility]  Instruction Review Code 1- Verbalizes Understanding       Cardiac Medications -Review what the following cardiac medications are used for, how they affect the body, and side effects that may occur when taking the medications.  Medications include Aspirin, Beta blockers, calcium channel blockers, ACE Inhibitors, angiotensin receptor blockers, diuretics, digoxin, and antihyperlipidemics. Flowsheet Row CARDIAC REHAB PHASE II EXERCISE from 12/13/2022 in [place] CARDIAC REHABILITATION  Date 11/01/22  Educator DJ  Instruction Review Code 1- Verbalizes Understanding       Congestive Heart Failure -Discuss the definition of CHF, how to live with CHF, the signs and symptoms of CHF, and how keep track of weight and sodium intake. Flowsheet  Row CARDIAC REHAB PHASE II EXERCISE from 12/13/2022 in [place] CARDIAC REHABILITATION  Date 11/15/22  Educator HB  Instruction Review Code 1- Verbalizes Understanding       Heart Disease and Intimacy -Discus the effect sexual activity has on the heart, how changes occur during intimacy as we age, and safety during sexual activity. Flowsheet Row CARDIAC REHAB PHASE II EXERCISE from 12/13/2022 in [place] CARDIAC REHABILITATION  Date 10/11/22  Educator [facility]  Instruction Review Code 1- Verbalizes Understanding       Smoking Cessation / COPD -Discuss different methods to quit smoking, the health benefits of quitting smoking, and the definition of COPD.   Nutrition I: Fats -Discuss the types of cholesterol, what cholesterol does to the heart, and how cholesterol levels can be controlled. Flowsheet Row CARDIAC REHAB PHASE II EXERCISE from 12/13/2022 in [place] CARDIAC REHABILITATION  Date 10/18/22  Educator [facility]  Instruction Review Code 1- Verbalizes Understanding       Nutrition II: Labels -Discuss the different components of food labels and how to read food label Flowsheet Row CARDIAC REHAB PHASE II EXERCISE from 12/13/2022 in [place] CARDIAC REHABILITATION  Date 10/18/22  Educator [facility]  Instruction Review Code 1- Verbalizes Understanding       Heart Parts/Heart Disease and PAD -Discuss the anatomy of the heart, the pathway of blood  circulation through the heart, and these are affected by heart disease.   Stress I: Signs and Symptoms -Discuss the causes of stress, how stress may lead to anxiety and depression, and ways to limit stress. Flowsheet Row CARDIAC REHAB PHASE II EXERCISE from 12/13/2022 in [place] CARDIAC REHABILITATION  Date 10/25/22  Educator [facility]  Instruction Review Code 1- Verbalizes Understanding       Stress II: Relaxation -Discuss different types of relaxation techniques to limit stress. Flowsheet Row CARDIAC REHAB PHASE II EXERCISE from  12/13/2022 in [place] CARDIAC REHABILITATION  Date 10/25/22  Educator [facility]  Instruction Review Code 1- Verbalizes Understanding       Warning Signs of Stroke / TIA -Discuss definition of a stroke, what the signs and symptoms are of a stroke, and how to identify when someone is having stroke.   Knowledge Questionnaire Score:  Knowledge Questionnaire Score - 09/14/22 1412       Knowledge Questionnaire Score   Pre Score 19/24             Core Components/Risk Factors/Patient Goals at Admission:  Personal Goals and Risk Factors at Admission - 09/14/22 1412       Core Components/Risk Factors/Patient Goals on Admission    Weight Management Yes;Weight Maintenance    Intervention Weight Management: Develop a combined nutrition and exercise program designed to reach desired caloric intake, while maintaining appropriate intake of nutrient and fiber, sodium and fats, and appropriate energy expenditure required for the weight goal.;Weight Management: Provide education and appropriate resources to help participant work on and attain dietary goals.    Admit Weight 135 lb 14.4 oz (61.6 kg)    Goal Weight: Short Term 135 lb (61.2 kg)    Goal Weight: Long Term 135 lb (61.2 kg)    Expected Outcomes Short Term: Continue to assess and modify interventions until short term weight is achieved;Long Term: Adherence to nutrition and physical activity/exercise program aimed toward attainment of established weight goal;Weight Maintenance: Understanding of the daily nutrition guidelines, which includes 25-35% calories from fat, 7% or less cal from saturated fats, less than 200mg  cholesterol, less than 1.5gm of sodium, & 5 or more servings of fruits and vegetables daily    Diabetes Yes    Intervention Provide education about signs/symptoms and action to take for hypo/hyperglycemia.;Provide education about proper nutrition, including hydration, and aerobic/resistive exercise prescription along with prescribed  medications to achieve blood glucose in normal ranges: Fasting glucose 65-99 mg/dL    Expected Outcomes Short Term: Participant verbalizes understanding of the signs/symptoms and immediate care of hyper/hypoglycemia, proper foot care and importance of medication, aerobic/resistive exercise and nutrition plan for blood glucose control.;Long Term: Attainment of HbA1C < 7%.    Heart Failure Yes    Intervention Provide a combined exercise and nutrition program that is supplemented with education, support and counseling about heart failure. Directed toward relieving symptoms such as shortness of breath, decreased exercise tolerance, and extremity edema.    Expected Outcomes Improve functional capacity of life;Short term: Attendance in program 2-3 days a week with increased exercise capacity. Reported lower sodium intake. Reported increased fruit and vegetable intake. Reports medication compliance.;Short term: Daily weights obtained and reported for increase. Utilizing diuretic protocols set by physician.;Long term: Adoption of self-care skills and reduction of barriers for early signs and symptoms recognition and intervention leading to self-care maintenance.    Hypertension Yes    Intervention Provide education on lifestyle modifcations including regular physical activity/exercise, weight management, moderate sodium restriction and  increased consumption of fresh fruit, vegetables, and low fat dairy, alcohol moderation, and smoking cessation.;Monitor prescription use compliance.    Expected Outcomes Short Term: Continued assessment and intervention until BP is < 140/24mm HG in hypertensive participants. < 130/59mm HG in hypertensive participants with diabetes, heart failure or chronic kidney disease.;Long Term: Maintenance of blood pressure at goal levels.    Lipids Yes    Intervention Provide education and support for participant on nutrition & aerobic/resistive exercise along with prescribed medications to  achieve LDL 70mg , HDL >40mg .    Expected Outcomes Short Term: Participant states understanding of desired cholesterol values and is compliant with medications prescribed. Participant is following exercise prescription and nutrition guidelines.;Long Term: Cholesterol controlled with medications as prescribed, with individualized exercise RX and with personalized nutrition plan. Value goals: LDL < 70mg , HDL > 40 mg.             Core Components/Risk Factors/Patient Goals Review:   Goals and Risk Factor Review     Row Name 10/04/22 0956 10/27/22 0957 11/24/22 0949         Core Components/Risk Factors/Patient Goals Review   Personal Goals Review Weight Management/Obesity;Heart Failure;Lipids;Diabetes;Hypertension Weight Management/Obesity;Heart Failure;Lipids;Diabetes;Hypertension Weight Management/Obesity;Heart Failure;Lipids;Diabetes;Hypertension     Review Pt states that she does not check her blood sugar at home and does not have a meter.  I spoke to the pt about asking her primary doctor for a prescription for a freestyle meter so that she can check her blood sugar daily.  She seemed very willing to ask her doctor about this so that she can monitor her blood sugar.  She does check her BP 2-3 times a week and weighs daily. Hylda is doing well in rehab.  Her weight is staying steady and she is feeling good overall.  She had an xray for her back and hip pain, but doctor could not tell what was causing her pain.  She has not been checking her sugars and not sure if she still needs to.  She is still taking farxiga, but it seems to be doing well. She has not had any heart failure symptoms recently. Her pressures are doing well overall. Jazayah is doing well in rehab.  Her weight continues to stay steady.  Her blood sugars seem to be doing well as she has not had any swings.  She continues to do well from heart failure setting as she has not had any symptoms.  She is wearing her compression leggings each  day.  She is supposed to start wearing oxygen at night and just waiting to get set up appointment made.     Expected Outcomes Short term:  Pt will have asked her doctor for a prescription for a freestyle meter  Long term:  Pt will be more proactive with her diabetes Short: Continue to montior heart failure symptoms Long; Continue to monitor risk factors. Shrot: Get oxygen set up Long: Continue to monitor heart failure symptoms.              Core Components/Risk Factors/Patient Goals at Discharge (Final Review):   Goals and Risk Factor Review - 11/24/22 0949       Core Components/Risk Factors/Patient Goals Review   Personal Goals Review Weight Management/Obesity;Heart Failure;Lipids;Diabetes;Hypertension    Review Therese is doing well in rehab.  Her weight continues to stay steady.  Her blood sugars seem to be doing well as she has not had any swings.  She continues to do well from heart failure setting as  she has not had any symptoms.  She is wearing her compression leggings each day.  She is supposed to start wearing oxygen at night and just waiting to get set up appointment made.    Expected Outcomes Shrot: Get oxygen set up Long: Continue to monitor heart failure symptoms.             ITP Comments:  ITP Comments     Row Name 09/14/22 1401 09/18/22 1000 09/20/22 0843 10/09/22 1127 10/13/22 0720   ITP Comments Patient attend orientation today.  Patient is attendingCardiac Rehabilitation Program.  Documentation for diagnosis can be found in [facility] encounter 08/18/22.  Reviewed medical chart, RPE, gym safety, and program guidelines.  Patient was fitted to equipment they will be using during rehab.  Patient is scheduled to start exercise on 09/18/22. First full day of exercise!  Patient was oriented to gym and equipment including functions, settings, policies, and procedures.  Patient's individual exercise prescription and treatment plan were reviewed.  All starting workloads were established  based on the results of the 6 minute walk test done at initial orientation visit.  The plan for exercise progression was also introduced and progression will be customized based on patient's performance and goals. 30 day review completed. ITP sent to Dr. Dina Rich, Medical Director of Cardiac Rehab. Continue with ITP unless changes are made by physician.  New to program. Sent Dr Corinna Lines a note regarding low blood pressure with complaints of dizziness and not feeling right in her head at home occasionally. Dr. Jenene Slicker forwarded note to HF clinic. Brynda Peon, NP decreased her Hydralazine from 25 mg to 12.5 mg TID and decreased her Imdur from 30 mg to 15 mg daily. We will continue to montior her blood pressure and symptoms.    Row Name 10/18/22 0804 11/15/22 0759 12/13/22 0655 12/13/22 1008     ITP Comments 30 day review completed. ITP sent to Dr. Dina Rich, Medical Director of Cardiac Rehab. Continue with ITP unless changes are made by physician. 30 day review completed. ITP sent to Dr. Dina Rich, Medical Director of Cardiac Rehab. Continue with ITP unless changes are made by physician. 30 day review completed. ITP sent to Dr. Dina Rich, Medical Director of Cardiac Rehab. Continue with ITP unless changes are made by physician. Kanasia graduated today from  rehab with 36 sessions completed.  Details of the patient's exercise prescription and what She needs to do in order to continue the prescription and progress were discussed with patient.  Patient was given a copy of prescription and goals.  Patient verbalized understanding. Liane plans to continue to exercise by walking at home.             Comments: Discharge ITP

## 2022-12-15 ENCOUNTER — Encounter (HOSPITAL_COMMUNITY): Payer: Medicare Other

## 2022-12-18 ENCOUNTER — Encounter (HOSPITAL_COMMUNITY): Payer: Medicare Other

## 2022-12-19 ENCOUNTER — Encounter: Payer: Self-pay | Admitting: Cardiology

## 2022-12-20 ENCOUNTER — Telehealth (HOSPITAL_COMMUNITY): Payer: Self-pay | Admitting: Cardiology

## 2022-12-20 ENCOUNTER — Encounter (HOSPITAL_COMMUNITY): Payer: Medicare Other

## 2022-12-20 MED ORDER — AMIODARONE HCL 200 MG PO TABS: ORAL_TABLET | ORAL | 3 refills | Status: DC

## 2022-12-20 NOTE — Telephone Encounter (Signed)
Patient called.  Patient aware.  

## 2022-12-20 NOTE — Telephone Encounter (Signed)
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  Would start amiodarone 200 mg bid x 2 wks then 200 mg daily after that to suppress PVCs.

## 2022-12-22 ENCOUNTER — Encounter (HOSPITAL_COMMUNITY): Payer: Medicare Other

## 2022-12-24 DIAGNOSIS — J441 Chronic obstructive pulmonary disease with (acute) exacerbation: Secondary | ICD-10-CM | POA: Diagnosis not present

## 2022-12-24 DIAGNOSIS — G4733 Obstructive sleep apnea (adult) (pediatric): Secondary | ICD-10-CM | POA: Diagnosis not present

## 2022-12-25 ENCOUNTER — Encounter (HOSPITAL_COMMUNITY): Payer: Medicare Other

## 2022-12-25 NOTE — Progress Notes (Addendum)
Discharge Progress Report  Patient Details  Name: Heather [name] MRN: [id number] Date of Birth: 07/04/1940 Referring Provider:   Flowsheet Row CARDIAC REHAB PHASE II ORIENTATION from 09/14/2022 in [facility] CARDIAC REHABILITATION  Referring Provider Luane School MD        Number of Visits: 36  Reason for Discharge:  Patient reached a stable level of exercise. Patient independent in their exercise. Patient has met program and personal goals.  Smoking History:  Social History   Tobacco Use  Smoking Status Former   Current packs/day: 0.00   Average packs/day: 1 pack/day for 40.0 years (40.0 ttl pk-yrs)   Types: Cigarettes   Start date: 03/20/1945   Quit date: 03/20/1985   Years since quitting: 37.7  Smokeless Tobacco Never    Diagnosis:  Heart failure, chronic systolic (HCC)  Initial Exercise Prescription:  Initial Exercise Prescription - 09/14/22 1400       Date of Initial Exercise RX and Referring Provider   Date 09/14/22    Referring Provider Luane School MD      Oxygen   Maintain Oxygen Saturation 88% or higher      Treadmill   MPH 1.3    Grade 0.5    Minutes 15    METs 2.08      NuStep   Level 1    SPM 80    Minutes 15    METs 2      Prescription Details   Frequency (times per week) 3    Duration Progress to 30 minutes of continuous aerobic without signs/symptoms of physical distress      Intensity   THRR 40-80% of Max Heartrate 94-124    Ratings of Perceived Exertion 11-13    Perceived Dyspnea 0-4      Progression   Progression Continue to progress workloads to maintain intensity without signs/symptoms of physical distress.      Resistance Training   Training Prescription Yes    Weight 2 lb    Reps 10-15             Discharge Exercise Prescription (Final Exercise Prescription Changes):  Exercise Prescription Changes - 11/29/22 1300       Response to Exercise   Blood Pressure (Admit) 132/74    Blood Pressure (Exit)  104/58    Heart Rate (Admit) 72 bpm    Heart Rate (Exercise) 100 bpm    Heart Rate (Exit) 74 bpm    Rating of Perceived Exertion (Exercise) 13    Duration Continue with 30 min of aerobic exercise without signs/symptoms of physical distress.    Intensity THRR unchanged      Progression   Progression Continue to progress workloads to maintain intensity without signs/symptoms of physical distress.      Resistance Training   Training Prescription Yes    Weight 3 lbs    Reps 10-15      NuStep   Level 3    SPM 65    Minutes 15    METs 1.7      Track   Laps 19    Minutes 15    METs 1.9      Home Exercise Plan   Plans to continue exercise at Home (comment)    Frequency Add 2 additional days to program exercise sessions.      Oxygen   Maintain Oxygen Saturation 88% or higher             Functional Capacity:  6 Minute Walk  Row Name 09/14/22 1402 12/04/22 1613       6 Minute Walk   Phase Initial Discharge    Distance 828 feet 768 feet  recalculated initial distance 788 ft    Distance % Change -- -2.5 %    Distance Feet Change -- -20 ft    Walk Time 6 minutes 6 minutes    # of Rest Breaks 0 0    MPH 1.57 1.45    METS 1.54 1.46    RPE 11 13    VO2 Peak 5.3 5.12    Symptoms Yes (comment) No    Comments R hip pain 5/10 hip pain 8/10    Resting HR 63 bpm 65 bpm    Resting BP 122/62 104/56    Resting Oxygen Saturation  99 % --    Exercise Oxygen Saturation  during 6 min walk 96 % --    Max Ex. HR 89 bpm 88 bpm    Max Ex. BP 134/72 148/76    2 Minute Post BP 124/70 --             Psychological, QOL, Others - Outcomes: PHQ 2/9:    12/01/2022    9:27 AM 11/09/2022    3:26 PM 10/19/2022   11:03 AM 09/14/2022    2:13 PM 08/31/2022    3:43 PM  Depression screen PHQ 2/9  Decreased Interest 0 0 0 0 0  Down, Depressed, Hopeless 0 0 0 0 0  PHQ - 2 Score 0 0 0 0 0  Altered sleeping 3   0   Tired, decreased energy 2   1   Change in appetite 3   1   Feeling  bad or failure about yourself  0   0   Trouble concentrating 0   0   Moving slowly or fidgety/restless 0   0   Suicidal thoughts 0   0   PHQ-9 Score 8   2   Difficult doing work/chores Not difficult at all   Not difficult at all     Quality of Life:  Quality of Life - 09/14/22 1411       Quality of Life   Select Quality of Life      Quality of Life Scores   Health/Function Pre 25 %    Socioeconomic Pre 24.08 %    Psych/Spiritual Pre 28 %    Family Pre 20.75 %    GLOBAL Pre 24.88 %              Nutrition & Weight - Outcomes:  Pre Biometrics - 09/14/22 1406       Pre Biometrics   Height 5' 2.5" (1.588 m)    Weight 61.6 kg    Waist Circumference 28 inches    Hip Circumference 34 inches    Waist to Hip Ratio 0.82 %    BMI (Calculated) 24.44    Triceps Skinfold 14 mm    % Body Fat 32.2 %    Grip Strength 17.1 kg    Single Leg Stand 5 seconds             Post Biometrics - 12/04/22 1614        Post  Biometrics   Height 5' 2.5" (1.588 m)    Weight 62.8 kg    Waist Circumference 31 inches    Hip Circumference 35.5 inches    Waist to Hip Ratio 0.87 %    BMI (Calculated) 24.89    Grip  Strength 21.3 kg    Single Leg Stand 1.34 seconds

## 2022-12-27 ENCOUNTER — Encounter (HOSPITAL_COMMUNITY): Payer: Medicare Other

## 2022-12-28 ENCOUNTER — Encounter: Payer: Self-pay | Admitting: Physical Medicine & Rehabilitation

## 2022-12-28 ENCOUNTER — Encounter: Payer: Medicare Other | Attending: Physical Medicine & Rehabilitation | Admitting: Physical Medicine & Rehabilitation

## 2022-12-28 VITALS — BP 116/65 | HR 60 | Ht 62.5 in | Wt 142.0 lb

## 2022-12-28 DIAGNOSIS — Z79891 Long term (current) use of opiate analgesic: Secondary | ICD-10-CM

## 2022-12-28 DIAGNOSIS — G894 Chronic pain syndrome: Secondary | ICD-10-CM

## 2022-12-28 DIAGNOSIS — F32A Depression, unspecified: Secondary | ICD-10-CM | POA: Diagnosis not present

## 2022-12-28 DIAGNOSIS — M25551 Pain in right hip: Secondary | ICD-10-CM | POA: Diagnosis not present

## 2022-12-28 DIAGNOSIS — G8929 Other chronic pain: Secondary | ICD-10-CM | POA: Insufficient documentation

## 2022-12-28 DIAGNOSIS — M545 Low back pain, unspecified: Secondary | ICD-10-CM | POA: Diagnosis not present

## 2022-12-28 DIAGNOSIS — M25552 Pain in left hip: Secondary | ICD-10-CM | POA: Diagnosis not present

## 2022-12-28 MED ORDER — VENLAFAXINE HCL ER 75 MG PO CP24: 75.0000 mg | ORAL_CAPSULE | ORAL | 5 refills | Status: DC

## 2022-12-28 NOTE — Progress Notes (Signed)
Subjective:    Patient ID: Heather [name], female    DOB: [birth year], 82 y.o.   MRN: [id number]  HPI     Heather [name] is a 82 y.o. year old female  who  has a past medical history of Anemia, Anxiety, Arthritis, COPD (chronic obstructive pulmonary disease) (HCC), CVA (cerebral vascular accident) (HCC), Depression, Dry eyes (04/14/2014), GERD (gastroesophageal reflux disease), Glaucoma, Gout, HTN (hypertension), and OSA (obstructive sleep apnea).   They are presenting to PM&R clinic as a new patient for pain management evaluation. They were referred or treatment of chronic pain.  Heather [name] reports she has had back pain for many years.  She had lumbar surgery in 1992 and her pain was controlled until about 2014.  Most of her pain is in her lower back.  Sometimes the pain shoots down her legs on both sides.  Pain has been stable for many years.  She has been on hydrocodone every 6 hours which helps control the pain.  Pain is not particularly changed with activity.  She also has history of right rotator cuff repair.  She has had 2 total hip replacements.       Medications tried: Tylenol- helps slightly  Ibuprofen- stomach issues, can't take NSAIDs Vanlaflaxine - helps her pain Gabapentin- made her itch  Lyrica- not sure if she used this Tramadol- didn't help enough Hydrocodone 5 mg helps the pain.  She uses this about 4 times a day.     Other treatments: PT/OT  - Helped briefly but then had CHF and had to stop exercise and it got worse, she likes riding a bicicyle  ESI didn't help Has not tried TENS unit Facet blocks ? Many years ago Ice and heat helped the pain.     Interval History  05/26/22 Heather [name] is here for follow-up of her chronic lower back pain.  Pain will sometimes shoot down her legs.  Hydrocodone continues to help keep her pain under control.  This allows her to do more around the house and be more active.  She is not having any side effects with the hydrocodone.  Patient  reports she has received her TENS unit, however due to plan for pacemaker she plans to check with cardiology before using it.   Interval History  07/27/22 Heather [name] is here to follow-up on her chronic pain.  Her worst pain continues to be in her lower back.  Venlafaxine and hydrocodone continue to help her pain and she is not having any significant side effects with these medications. She is not using her TENS unit, says this is due to cardiology recommendation.  She recently had an ICD device implanted by cardiology. She is trying to use her exercise bike to increase her activity levels.   Interval history 10/19/2022 Heather [name] is here for follow-up regarding her chronic pain.  She continues to have a lot of pain in her lower back.  She reports she has been working on cardiac rehabilitation and often she will have pain around her right hip while doing this treatment.  She has a history of bilateral hip replacements.  She reports history of bilateral hip replacements.  Vicodin 5 mg is helping to keep her pain under good control.  This medication takes her pain from a 7 or 8 to a 1 or 2 level.   Interval history 10/24 Heather [name] is here for follow-up regarding her chronic back pain.  She has completed cardiac rehabilitation.  She continues to  have occasional pain in her bilateral hips, has history of bilateral hip replacements.  Sometimes hips bother her when she is using the exercise bike.  Vicodin 5 mg continues to be beneficial for her hip and back pain and allows her to do more activities around the house.  She only uses this medication when her pain is very severe and still has 56 tablets left after having the medication filled over 2 months ago.    IMPRESSION: 1. Osteopenia. 2. No acute osseous abnormalities. 3. Bilateral hip arthroplasty. The left-sided acetabular screws are fractured.  Pain Inventory Average Pain 8 Pain Right Now 3 My pain is stabbing  In the last 24 hours, has pain  interfered with the following? General activity 6 Relation with others 0 Enjoyment of life 8 What TIME of day is your pain at its worst? varies Sleep (in general) Poor  Pain is worse with: walking and standing Pain improves with: rest and medication Relief from Meds: 9  Family History  Problem Relation Age of Onset   High blood pressure Mother    Aneurysm Father    Diabetes Brother    Pancreatitis Brother    Hypertension Niece    Congestive Heart Failure Niece    Social History   Socioeconomic History   Marital status: Divorced    Spouse name: Not on file   Number of children: Not on file   Years of education: Not on file   Highest education level: 9th grade  Occupational History   Not on file  Tobacco Use   Smoking status: Former    Current packs/day: 0.00    Average packs/day: 1 pack/day for 40.0 years (40.0 ttl pk-yrs)    Types: Cigarettes    Start date: 03/20/1945    Quit date: 03/20/1985    Years since quitting: 37.8   Smokeless tobacco: Never  Vaping Use   Vaping status: Never Used  Substance and Sexual Activity   Alcohol use: No   Drug use: No   Sexual activity: Yes    Birth control/protection: Surgical  Other Topics Concern   Not on file  Social History Narrative   [name] is a 82 year old divorced female who lives alone. She has support of her son and sister.    Social Determinants of Health   Financial Resource Strain: Medium Risk (11/27/2022)   Overall Financial Resource Strain (CARDIA)    Difficulty of Paying Living Expenses: Somewhat hard  Food Insecurity: No Food Insecurity (11/27/2022)   Hunger Vital Sign    Worried About Running Out of Food in the Last Year: Never true    Ran Out of Food in the Last Year: Never true  Recent Concern: Food Insecurity - Food Insecurity Present (11/21/2022)   Hunger Vital Sign    Worried About Running Out of Food in the Last Year: Never true    Ran Out of Food in the Last Year: Sometimes true  Transportation  Needs: Unmet Transportation Needs (11/27/2022)   PRAPARE - Administrator, Civil Service (Medical): Yes    Lack of Transportation (Non-Medical): No  Physical Activity: Insufficiently Active (11/27/2022)   Exercise Vital Sign    Days of Exercise per Week: 3 days    Minutes of Exercise per Session: 40 min  Stress: Stress Concern Present (11/27/2022)   [employer] of Occupational Health - Occupational Stress Questionnaire    Feeling of Stress : To some extent  Social Connections: Moderately Isolated (11/27/2022)   Social  Connection and Isolation Panel [NHANES]    Frequency of Communication with Friends and Family: More than three times a week    Frequency of Social Gatherings with Friends and Family: More than three times a week    Attends Religious Services: More than 4 times per year    Active Member of [employer] or Organizations: No    Attends Banker Meetings: Never    Marital Status: Widowed   Past Surgical History:  Procedure Laterality Date   ABDOMINAL HYSTERECTOMY     BACK SURGERY     lumbar-disc   BIV PACEMAKER INSERTION CRT-P N/A 07/12/2022   Procedure: BIV PACEMAKER INSERTION CRT-P;  Surgeon: Duke Salvia, MD;  Location: [facility] INVASIVE CV LAB;  Service: Cardiovascular;  Laterality: N/A;   BUNIONECTOMY Bilateral    FOOT SURGERY Left    repair of "kissing Cousins"   JOINT REPLACEMENT Bilateral    hip    JOINT REPLACEMENT Right 2010 or 2012   knee   KNEE SURGERY     RIGHT/LEFT HEART CATH AND CORONARY ANGIOGRAPHY N/A 10/27/2021   Procedure: RIGHT/LEFT HEART CATH AND CORONARY ANGIOGRAPHY;  Surgeon: Corky Crafts, MD;  Location: [facility] INVASIVE CV LAB;  Service: Cardiovascular;  Laterality: N/A;   SHOULDER ARTHROSCOPY Right    shoulder surgery in [place] about 2000   SHOULDER HEMI-ARTHROPLASTY Left 03/25/2014   Procedure: LEFT SHOULDER HEMI-ARTHROPLASTY;  Surgeon: Vickki Hearing, MD;  Location: [facility];  Service: Orthopedics;  Laterality: Left;    Past Surgical History:  Procedure Laterality Date   ABDOMINAL HYSTERECTOMY     BACK SURGERY     lumbar-disc   BIV PACEMAKER INSERTION CRT-P N/A 07/12/2022   Procedure: BIV PACEMAKER INSERTION CRT-P;  Surgeon: Duke Salvia, MD;  Location: [facility] INVASIVE CV LAB;  Service: Cardiovascular;  Laterality: N/A;   BUNIONECTOMY Bilateral    FOOT SURGERY Left    repair of "kissing Cousins"   JOINT REPLACEMENT Bilateral    hip    JOINT REPLACEMENT Right 2010 or 2012   knee   KNEE SURGERY     RIGHT/LEFT HEART CATH AND CORONARY ANGIOGRAPHY N/A 10/27/2021   Procedure: RIGHT/LEFT HEART CATH AND CORONARY ANGIOGRAPHY;  Surgeon: Corky Crafts, MD;  Location: [facility] INVASIVE CV LAB;  Service: Cardiovascular;  Laterality: N/A;   SHOULDER ARTHROSCOPY Right    shoulder surgery in [place] about 2000   SHOULDER HEMI-ARTHROPLASTY Left 03/25/2014   Procedure: LEFT SHOULDER HEMI-ARTHROPLASTY;  Surgeon: Vickki Hearing, MD;  Location: [facility];  Service: Orthopedics;  Laterality: Left;   Past Medical History:  Diagnosis Date   Anemia    Anxiety    Arthritis    COPD (chronic obstructive pulmonary disease) (HCC)    CVA (cerebral vascular accident) (HCC)    Depression    Dry eyes 04/14/2014   GERD (gastroesophageal reflux disease)    Glaucoma    Gout    HTN (hypertension)    OSA (obstructive sleep apnea)    Ht 5' 2.5" (1.588 m)   BMI 24.91 kg/m   Opioid Risk Score:   Fall Risk Score:  `1  Depression screen [facility] 2/9     12/01/2022    9:27 AM 11/09/2022    3:26 PM 10/19/2022   11:03 AM 09/14/2022    2:13 PM 08/31/2022    3:43 PM 05/26/2022    3:20 PM 05/26/2022   11:05 AM  Depression screen PHQ 2/9  Decreased Interest 0 0 0 0 0 0 0  Down, Depressed,  Hopeless 0 0 0 0 0 0 0  PHQ - 2 Score 0 0 0 0 0 0 0  Altered sleeping 3   0  2   Tired, decreased energy 2   1  0   Change in appetite 3   1  2    Feeling bad or failure about yourself  0   0  0   Trouble concentrating 0   0  0   Moving slowly or  fidgety/restless 0   0  1   Suicidal thoughts 0   0  0   PHQ-9 Score 8   2  5    Difficult doing work/chores Not difficult at all   Not difficult at all         Review of Systems  Musculoskeletal:  Positive for back pain and gait problem.  All other systems reviewed and are negative.     Objective:   Physical Exam    Gen: no distress, normal appearing HEENT: oral mucosa pink and moist, NCAT Chest: normal effort, normal rate of breathing Abd: soft, non-distended Ext: no edema Psych: pleasant, normal affect Skin: intact Neuro: Alert and oriented, follows commands, cranial nerves II through XII grossly intact, normal speech and language Moving all 4 extremities to gravity and resistance Sensation intact light touch in all 4 extremities MSK: Mild L-spine tenderness to palpation Facet loading positive   Musculoskeletal:  Slump test negative bilaterally-anterior hip pain reported on the right Facet loading positive Lumbar paraspinal muscle tenderness present Pain at bilateral hips with external rotation   10/06/16 MRI L spine IMPRESSION: 1. 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 This cyst partially effaces the thecal sac and impinges on the exiting T12 nerve root. 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 Further description above. 2. Lower lumbar facet arthropathy with grade 1 anterolisthesis at L4-5 and L5-S1. 3. 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 4. Prior L5-S1 decompressive laminectomy with patent thecal sac. 5. Prominent iliopsoas atrophy bilaterally. Status post bilateral hip replacement.    10/23/22 x-ray hip  IMPRESSION: 1. Osteopenia. 2. No acute osseous abnormalities. 3. Bilateral hip arthroplasty. The left-sided acetabular screws are fractured.      Assessment & Plan:   Heather [name] is a 82 y.o. year old  female  who  has a past medical history of Anemia, Anxiety, Arthritis, COPD (chronic obstructive pulmonary disease) (HCC), CVA (cerebral vascular accident) (HCC), Depression, Dry eyes (04/14/2014), GERD (gastroesophageal reflux disease), Glaucoma, Gout, HTN (hypertension), and OSA (obstructive sleep apnea).   They are presenting to PM&R clinic as a new patient for treatment of chronic back pain.      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 Shirlee Latch and Karle Plumber due QT and felt okay to continue, advised her to keep monitoring this with cardiology -Continue monitor random UDS-she does take medication sparingly some may not be present if she had not taken it recently -Continue to monitor PDMP -Continue hydorocodone 5 mg  every 6 hours as needed-call when refill needed -Pill count consistent today   Depression denies SI or HI -Well controlled with vanlfexine per patient   Bilateral hip pain with history of bilateral hip replacements -Hip x-ray indicate left-sided acetabular screws are fractured-advised her to discuss this with her orthopedic doctor.  She says she will do this

## 2022-12-29 ENCOUNTER — Encounter (HOSPITAL_COMMUNITY): Payer: Medicare Other

## 2023-01-01 ENCOUNTER — Encounter (HOSPITAL_COMMUNITY): Payer: Medicare Other

## 2023-01-03 ENCOUNTER — Encounter: Payer: Self-pay | Admitting: Physical Medicine & Rehabilitation

## 2023-01-03 ENCOUNTER — Encounter (HOSPITAL_COMMUNITY): Payer: Medicare Other

## 2023-01-05 NOTE — Progress Notes (Signed)
ADVANCED HF CLINIC NOTE  Primary Care: [name], MD HF Cardiologist: Dr. Shirlee Latch  HPI: 82 y.o. [name] w/ h/o CKD IIIb, LBBB, R cerebellar CVA and chronic biventricular heart failure first diagnosed 10/2021. Echo then showed severely reduced LVEF 15-20%, RV mildly reduced, mild-mod MR. Echo also showed dyssynchrony c/w LBBB. Of note, prior echo in 2017 showed normal LVEF w/ septal dyssynchrony and normal RV.   She underwent [facility] 10/2021 which showed mild nonobstructive CAD. 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 GDMT regimen included Farxiga, Imdur, Bisoprolol. Diuretic regimen Lasix 20 mg daily.    Echo 12/23 showed EF still severely reduced < 15%, progression of RV dysfunction (mod reduced) and severely elevated PA pressure w/ estimated RVSP 68 mmHg.     Admitted 1/24 with CHF exacerbation. Started on lasix gtt, and placed on empiric milrinone to augment diuresis. GDMT held w/ soft BP. cMRI showed LVEF 16%, mid-apical inferolateral subendocardial LGE, and moderately dilated RV with RVEF 23%. Milrinone was weaned and GDMT titrated. She was discharged home with HH, weight 150 lbs.  Echo 4/24 showed EF <20% with moderate LV enlargement, septal-lateral dyssynchrony, moderate RV dysfunction, PASP 65 mmHg. She was referred to EP and underwent Medtronic CRT-P placement 5/24. Echo 9/24, EF 20-25%, mild LV dilation, moderate RV dysfunction with normal size.   Follow up 9/24, BiV % down to 87%. 7 day Zio placed showing mostly NSR with 16.6% PVC burden. Amiodarone started.  She returns back today for followup of CHF. Weight is stable.  She feels better with CRT placement, now able to walk through [employer] without stopping. No chest pain. She has been going to cardiac rehab, feels like this helps.  She gets short of breath if she tries to "walk fast."  No orthopnea/PND.  No palpitations. Using oxygen at night. She feels like she  has a yeast infection.   Zio 7 day (10/24) showed mostly NSR with 16.6% PVR burden. Amiodarone started.  Today she returns for HF follow up. Overall feeling fine. She exercises on stationary bike for 15 minutes 2-3x/week, she has mildly dyspnea with exercise but otherwise no SOB with activity. She finished CR. Has ankle swelling, swelling improved by morning. Denies palpitations, CP, dizziness,or PND/Orthopnea. Appetite ok. No fever or chills. Weight at home 141 pounds. Taking all medications. Had yeast infection symptoms recently, improved with fluconazole, but feels symptoms have returned. Of note, has a boyfriend.  REDS clip 26%   ECG (personally reviewed): AV paced, 60 bpm, QTC 490 msec  Medtronic device interrogation: unable to interrogate device in clinic today.  Labs (1/24): K 4.0, creatinine 1.33, hgb 12.9 Labs (3/24): K 4.4, creatinine 1.58, LDL 82 Labs (5/24): K 4.6, creatinine 1.39, Digoxin 2.3  Labs (6/24): digoxin 1.1, LDL 81, K 4.2, creatinine 1.6 Labs (10/24): K 5.0, creatinine 1.54  PMH: 1. Chronic systolic CHF: Nonischemic cardiomyopathy. Chronic LBBB. Medtronic CRT-P device.  - Angioedema with ACEI. - R/LHC (8/23): mild, non-obstructive CAD; RA mean 8, PA 54/18 (mean 34), PCWP 18, CO/CI (Fick) 3.87/2.24  - Echo (12/23): EF < 15%, moderately reduced RV function, severely elevated PA pressure (RVSP 68 mmHg) - Cardiac MRI (1/24): LVEF 16%, RVEF 23%, mid-apical inferolateral LGE in prior infarction pattern (looks like prior MI but does not explain extent of CMP).  - Echo (4/24): EF < 20% with moderate LV enlargement, septal-lateral dyssynchrony, moderate RV dysfunction, PASP 65 mmHg.  - Echo (9/24):  EF 20-25%, mild LV dilation, moderate RV dysfunction with normal size.  2. COPD: Prior smoker.  3. CKD stage 3 4. CVA: prior cerebellar CVA.  5. HTN 6. OSA 7. PVC: Zio (10/24) showed 16.6% burden, amiodarone started  Current Outpatient Medications  Medication Sig Dispense  Refill   acetaminophen (TYLENOL) 500 MG tablet Take 500 mg by mouth every 6 (six) hours as needed for mild pain or moderate pain.     allopurinol (ZYLOPRIM) 300 MG tablet Take 0.5 tablets (150 mg total) by mouth daily. 45 tablet 0   amiodarone (PACERONE) 200 MG tablet Take 1 tablet (200 mg total) by mouth 2 (two) times daily for 14 days, THEN 1 tablet (200 mg total) daily. (Patient taking differently: Patient takes 1 tablet by mouth daily.) 90 tablet 3   aspirin (ASPIRIN CHILDRENS) 81 MG chewable tablet Chew 1 tablet (81 mg total) by mouth daily. 90 tablet 3   atorvastatin (LIPITOR) 20 MG tablet Take 1 tablet (20 mg total) by mouth daily. 90 tablet 3   bisoprolol (ZEBETA) 5 MG tablet Take 1 tablet (5 mg total) by mouth daily. 90 tablet 3   chlorpheniramine (CHLOR-TRIMETON) 4 MG tablet Take 4 mg by mouth daily as needed for allergies.     Cholecalciferol (DIALYVITE VITAMIN D 5000 PO) Take 5,000 Units by mouth daily.     dapagliflozin propanediol (FARXIGA) 10 MG TABS tablet Take 1 tablet (10 mg total) by mouth daily. 90 tablet 3   digoxin (LANOXIN) 0.125 MG tablet Take 0.5 tablets (0.0625 mg total) by mouth every other day. 45 tablet 3   famotidine (PEPCID) 20 MG tablet Take 1 tablet (20 mg total) by mouth at bedtime. One after supper 30 tablet 3   Fluticasone-Umeclidin-Vilant (TRELEGY ELLIPTA) 200-62.5-25 MCG/ACT AEPB Inhale 1 puff into the lungs daily. 60 each 3   furosemide (LASIX) 40 MG tablet Take 0.5 tablets (20 mg total) by mouth every other day. 15 tablet 3   hydrALAZINE (APRESOLINE) 25 MG tablet Take 0.5 tablets (12.5 mg total) by mouth 3 (three) times daily. 90 tablet 3   HYDROcodone-acetaminophen (NORCO/VICODIN) 5-325 MG tablet Take 1 tablet by mouth every 6 (six) hours as needed for moderate pain. 120 tablet 0   isosorbide mononitrate (IMDUR) 30 MG 24 hr tablet Take 0.5 tablets (15 mg total) by mouth daily. 45 tablet 3   ketoconazole (NIZORAL) 2 % cream Apply 1 Application topically as  needed for irritation.     Multiple Vitamins-Minerals (MULTIVITAMIN WITH MINERALS) tablet Take 1 tablet by mouth daily.     PROAIR HFA 108 (90 Base) MCG/ACT inhaler Inhale 2 puffs into the lungs every 6 (six) hours as needed for wheezing or shortness of breath. 18 g 2   Propylene Glycol (SYSTANE COMPLETE) 0.6 % SOLN Place 1 drop into both eyes 2 (two) times daily.     spironolactone (ALDACTONE) 25 MG tablet Take 1 tablet (25 mg total) by mouth daily. 30 tablet 5   venlafaxine XR (EFFEXOR-XR) 75 MG 24 hr capsule Take 1 capsule (75 mg total) by mouth See admin instructions. 90 capsule 5   No current facility-administered medications for this encounter.   Allergies  Allergen Reactions   Lovastatin Swelling    Patient states that her tongue swells and she gets a tingling feeling all over. Patient states that her tongue swells and she gets a tingling feeling all over.   Lisinopril Swelling    Tongue swelling   Social History   Socioeconomic History  Marital status: Divorced    Spouse name: Not on file   Number of children: Not on file   Years of education: Not on file   Highest education level: 9th grade  Occupational History   Not on file  Tobacco Use   Smoking status: Former    Current packs/day: 0.00    Average packs/day: 1 pack/day for 40.0 years (40.0 ttl pk-yrs)    Types: Cigarettes    Start date: 03/20/1945    Quit date: 03/20/1985    Years since quitting: 37.8   Smokeless tobacco: Never  Vaping Use   Vaping status: Never Used  Substance and Sexual Activity   Alcohol use: No   Drug use: No   Sexual activity: Yes    Birth control/protection: Surgical  Other Topics Concern   Not on file  Social History Narrative   Heather [name] is a 82 year old divorced female who lives alone. She has support of her son and sister.    Social Determinants of Health   Financial Resource Strain: Medium Risk (11/27/2022)   Overall Financial Resource Strain (CARDIA)    Difficulty of Paying  Living Expenses: Somewhat hard  Food Insecurity: No Food Insecurity (11/27/2022)   Hunger Vital Sign    Worried About Running Out of Food in the Last Year: Never true    Ran Out of Food in the Last Year: Never true  Recent Concern: Food Insecurity - Food Insecurity Present (11/21/2022)   Hunger Vital Sign    Worried About Running Out of Food in the Last Year: Never true    Ran Out of Food in the Last Year: Sometimes true  Transportation Needs: Unmet Transportation Needs (11/27/2022)   PRAPARE - Administrator, Civil Service (Medical): Yes    Lack of Transportation (Non-Medical): No  Physical Activity: Insufficiently Active (11/27/2022)   Exercise Vital Sign    Days of Exercise per Week: 3 days    Minutes of Exercise per Session: 40 min  Stress: Stress Concern Present (11/27/2022)   [employer] of Occupational Health - Occupational Stress Questionnaire    Feeling of Stress : To some extent  Social Connections: Moderately Isolated (11/27/2022)   Social Connection and Isolation Panel [NHANES]    Frequency of Communication with Friends and Family: More than three times a week    Frequency of Social Gatherings with Friends and Family: More than three times a week    Attends Religious Services: More than 4 times per year    Active Member of [employer] or Organizations: No    Attends Banker Meetings: Never    Marital Status: Widowed  Intimate Partner Violence: Not At Risk (11/09/2022)   Humiliation, Afraid, Rape, and Kick questionnaire    Fear of Current or Ex-Partner: No    Emotionally Abused: No    Physically Abused: No    Sexually Abused: No   Family History  Problem Relation Age of Onset   High blood pressure Mother    Aneurysm Father    Diabetes Brother    Pancreatitis Brother    Hypertension Niece    Congestive Heart Failure Niece    Wt Readings from Last 3 Encounters:  01/08/23 66.1 kg (145 lb 12.8 oz)  12/28/22 64.4 kg (142 lb)  12/04/22 62.8 kg (138  lb 6.4 oz)   BP 132/64   Pulse 60   Wt 66.1 kg (145 lb 12.8 oz)   SpO2 92%   BMI 26.24 kg/m  PHYSICAL EXAM: General:  NAD. No resp difficulty, walked into clinic HEENT: Normal Neck: Supple. No JVD. Carotids 2+ bilat; no bruits. No lymphadenopathy or thryomegaly appreciated. Cor: PMI nondisplaced. Regular rate & rhythm. No rubs, gallops or murmurs. Lungs: Clear Abdomen: Soft, nontender, nondistended. No hepatosplenomegaly. No bruits or masses. Good bowel sounds. Extremities: No cyanosis, clubbing, rash, 1+ pedal edema Neuro: Alert & oriented x 3, cranial nerves grossly intact. Moves all 4 extremities w/o difficulty. Affect pleasant.  ASSESSMENT & PLAN: 1. Chronic systolic CHF: Nonischemic cardiomyopathy. Cath in 8/23 with no significant CAD, CI 2.24. ?LBBB cardiomyopathy versus familial versus prior myocarditis. Echo in 12/23 with EF < 20%, moderately reduced RV function, D-shaped septum, mild-moderate MR. She was admitted 1/24 with volume overload, empirically started on milrinone 0.25 and Lasix gtt.  Cardiac MRI showed LV severely dilated with EF 16% and septal-lateral dyssynchrony, RV moderately dilated with EF 23%, mid-apical inferolateral LGE in prior infarction pattern. However, CAD does not explain the extent of her cardiomyopathy based on cath. Echo in 5/23 showed EF <20% with moderate LV enlargement, septal-lateral dyssynchrony, moderate RV dysfunction, PASP 65 mmHg. Underwent CRT-P 5/24.  Echo 9/24 showed that EF is still 20-25%.  She was noted to only be BiV pacing 87% of the time, frequent PVCs noted on ECG. Concern that PVCs are decreasing her BiV pacing percentage and also could cause worsening LV function if >10% of total beats. (See below). NYHA class I-II.  She is not volume overloaded on exam, REDs 26%. Suspect pedal edema 2/2 venous insufficiency. - Continue compression hose. - Continue bisoprolol 5 mg daily. - Continue Lasix 20 mg daily. BMET/BNP today.  - Continue  spironolactone 25 mg daily.  - Continue digoxin 0.0625. Dig level 0.7 (12/12/22) - Continue Farxiga 10 mg daily.  Has possible yeast infection, will treat with 1 dose of fluconazole.  - Continue losartan 25 mg daily (angioedema with lisinopril so no Entresto). - Continue hydralazine 12.5 mg tid + Imdur 15 mg daily. - Arrange for repeat echo next visit after PVC adequately suppressed and BiV % increased to look for improvement in EF. 2. CAD: mild nonobstructive disease by cath 8/23, 25% mRCA plaque. No chest pain.  - Continue ASA + statin  3. HLD: on statin for CAD and h/o CVA. LDL Goal < 70 - Good lipids 6/24. 4. CKD stage 3: Baseline creatinine around 1.5. - Continue SGLT2i. BMET today.  5. H/o right cerebellar CVA: On ASA 81 and statin.  6. OSA: Encouraged CPAP use. 7. PVCs: Zio 7 day (10/24) showed 16.6 % burden. Amiodarone started. - Continue amiodarone 200 mg daily. LFTs and TSH today, she will need a regular eye exam while taking amiodarone. - Continue bisoprolol - Repeat 7-day Zio to quantify PVC burden on amiodarone (unable to interrogate BiV % in clinic today). 8. Vaginitis: Repeat fluconazole 150 mg x 1, cause use OTC Moinstat. Does not sound like she is having UTI symptoms. -  Needs PCP vs GYN eval. If chronic yeast or UTI, need to stop SGLT2i. We discussed this today.   Follow up in 2 months with APP.   Anderson Malta [place], FNP-bC 01/08/23

## 2023-01-08 ENCOUNTER — Other Ambulatory Visit (HOSPITAL_COMMUNITY): Payer: Self-pay | Admitting: Cardiology

## 2023-01-08 ENCOUNTER — Ambulatory Visit (HOSPITAL_COMMUNITY)
Admission: RE | Admit: 2023-01-08 | Discharge: 2023-01-08 | Disposition: A | Discharge status: Home / Self Care | Payer: Medicare Other | Source: Ambulatory Visit | Attending: Family Medicine | Admitting: Family Medicine

## 2023-01-08 ENCOUNTER — Encounter (HOSPITAL_COMMUNITY): Payer: Self-pay

## 2023-01-08 ENCOUNTER — Telehealth (HOSPITAL_COMMUNITY): Payer: Self-pay

## 2023-01-08 ENCOUNTER — Inpatient Hospital Stay (HOSPITAL_COMMUNITY)
Admission: RE | Admit: 2023-01-08 | Discharge: 2023-01-08 | Disposition: A | Discharge status: Home / Self Care | Payer: Medicare Other | Source: Ambulatory Visit | Attending: Cardiology | Admitting: Cardiology

## 2023-01-08 VITALS — BP 132/64 | HR 60 | Wt 145.8 lb

## 2023-01-08 DIAGNOSIS — N760 Acute vaginitis: Secondary | ICD-10-CM | POA: Diagnosis not present

## 2023-01-08 DIAGNOSIS — G4733 Obstructive sleep apnea (adult) (pediatric): Secondary | ICD-10-CM | POA: Diagnosis not present

## 2023-01-08 DIAGNOSIS — I130 Hypertensive heart and chronic kidney disease with heart failure and stage 1 through stage 4 chronic kidney disease, or unspecified chronic kidney disease: Secondary | ICD-10-CM | POA: Insufficient documentation

## 2023-01-08 DIAGNOSIS — I493 Ventricular premature depolarization: Secondary | ICD-10-CM

## 2023-01-08 DIAGNOSIS — Z7984 Long term (current) use of oral hypoglycemic drugs: Secondary | ICD-10-CM | POA: Diagnosis not present

## 2023-01-08 DIAGNOSIS — I428 Other cardiomyopathies: Secondary | ICD-10-CM | POA: Diagnosis not present

## 2023-01-08 DIAGNOSIS — I5022 Chronic systolic (congestive) heart failure: Secondary | ICD-10-CM

## 2023-01-08 DIAGNOSIS — B3731 Acute candidiasis of vulva and vagina: Secondary | ICD-10-CM

## 2023-01-08 DIAGNOSIS — I5082 Biventricular heart failure: Secondary | ICD-10-CM | POA: Insufficient documentation

## 2023-01-08 DIAGNOSIS — Z7982 Long term (current) use of aspirin: Secondary | ICD-10-CM | POA: Insufficient documentation

## 2023-01-08 DIAGNOSIS — E785 Hyperlipidemia, unspecified: Secondary | ICD-10-CM | POA: Diagnosis not present

## 2023-01-08 DIAGNOSIS — I447 Left bundle-branch block, unspecified: Secondary | ICD-10-CM | POA: Insufficient documentation

## 2023-01-08 DIAGNOSIS — Z79899 Other long term (current) drug therapy: Secondary | ICD-10-CM | POA: Diagnosis not present

## 2023-01-08 DIAGNOSIS — Z8673 Personal history of transient ischemic attack (TIA), and cerebral infarction without residual deficits: Secondary | ICD-10-CM

## 2023-01-08 DIAGNOSIS — I2510 Atherosclerotic heart disease of native coronary artery without angina pectoris: Secondary | ICD-10-CM

## 2023-01-08 DIAGNOSIS — R0609 Other forms of dyspnea: Secondary | ICD-10-CM | POA: Insufficient documentation

## 2023-01-08 DIAGNOSIS — N1832 Chronic kidney disease, stage 3b: Secondary | ICD-10-CM | POA: Diagnosis not present

## 2023-01-08 DIAGNOSIS — N1831 Chronic kidney disease, stage 3a: Secondary | ICD-10-CM | POA: Diagnosis not present

## 2023-01-08 LAB — COMPREHENSIVE METABOLIC PANEL
ALT: 49 U/L — ABNORMAL HIGH (ref 0–44)
AST: 38 U/L (ref 15–41)
Albumin: 3.6 g/dL (ref 3.5–5.0)
Alkaline Phosphatase: 72 U/L (ref 38–126)
Anion gap: 11 (ref 5–15)
BUN: 45 mg/dL — ABNORMAL HIGH (ref 8–23)
CO2: 22 mmol/L (ref 22–32)
Calcium: 9 mg/dL (ref 8.9–10.3)
Chloride: 109 mmol/L (ref 98–111)
Creatinine, Ser: 2.03 mg/dL — ABNORMAL HIGH (ref 0.44–1.00)
GFR, Estimated: 24 mL/min — ABNORMAL LOW (ref >60)
Glucose, Bld: 96 mg/dL (ref 70–99)
Potassium: 4.3 mmol/L (ref 3.5–5.1)
Sodium: 142 mmol/L (ref 135–145)
Total Bilirubin: 0.5 mg/dL (ref <1.2)
Total Protein: 6.4 g/dL — ABNORMAL LOW (ref 6.5–8.1)

## 2023-01-08 LAB — TSH: TSH: 2.096 u[IU]/mL (ref 0.350–4.500)

## 2023-01-08 LAB — BRAIN NATRIURETIC PEPTIDE: B Natriuretic Peptide: >4500 pg/mL — ABNORMAL HIGH (ref 0.0–100.0)

## 2023-01-08 MED ORDER — POTASSIUM CHLORIDE CRYS ER 20 MEQ PO TBCR: 20.0000 meq | EXTENDED_RELEASE_TABLET | Freq: Every day | ORAL | 3 refills | Status: DC

## 2023-01-08 MED ORDER — FLUCONAZOLE 150 MG PO TABS: 150.0000 mg | ORAL_TABLET | Freq: Once | ORAL | 0 refills | Status: AC

## 2023-01-08 MED ORDER — FUROSEMIDE 40 MG PO TABS: 40.0000 mg | ORAL_TABLET | Freq: Every day | ORAL | 3 refills | Status: DC

## 2023-01-08 NOTE — Addendum Note (Signed)
Encounter addended by: Faythe Casa, CMA on: 01/08/2023 3:14 PM  Actions taken: Clinical Note Signed

## 2023-01-08 NOTE — Progress Notes (Signed)
Zio patch placed onto patient.  All instructions and information reviewed with patient, they verbalize understanding with no questions. 

## 2023-01-08 NOTE — Patient Instructions (Addendum)
Labs done today. We will contact you only if your labs are abnormal.  Please wear your compression hose daily, place them on as soon as you get up in the morning and remove before you go to bed at night.  Take Fluconazole 150mg  (1 tablet) by mouth once.   No other medication changes were made. Please continue all current medications as prescribed.  Your provider has recommended that  you wear a Zio Patch for 7 days.  This monitor will record your heart rhythm for our review.  IF you have any symptoms while wearing the monitor please press the button.  If you have any issues with the patch or you notice a red or orange light on it please call the company at [phone].  Once you remove the patch please mail it back to the company as soon as possible so we can get the results.  Your physician recommends that you schedule a follow-up appointment in: 2 months for an appointment with our NP/PA Clinic here in our office.   If you have any questions or concerns before your next appointment please send [place] a message through [place] or call our office at [phone].    TO LEAVE A MESSAGE FOR THE NURSE SELECT OPTION 2, PLEASE LEAVE A MESSAGE INCLUDING: YOUR NAME DATE OF BIRTH CALL BACK NUMBER REASON FOR CALL**this is important as we prioritize the call backs  YOU WILL RECEIVE A CALL BACK THE SAME DAY AS LONG AS YOU CALL BEFORE 4:00 PM   Do the following things EVERYDAY: Weigh yourself in the morning before breakfast. Write it down and keep it in a log. Take your medicines as prescribed Eat low salt foods--Limit salt (sodium) to 2000 mg per day.  Stay as active as you can everyday Limit all fluids for the day to less than 2 liters   At the [facility], you and your health needs are our priority. As part of our continuing mission to provide you with exceptional heart care, we have created designated Provider Care Teams. These Care Teams include your primary Cardiologist  (physician) and Advanced Practice Providers (APPs- Physician Assistants and Nurse Practitioners) who all work together to provide you with the care you need, when you need it.   You may see any of the following providers on your designated Care Team at your next follow up: Dr Arvilla Meres Dr Marca Ancona Dr. Marcos Eke, NP Robbie Lis, [place] [facility] [place], [place] Brynda Peon, NP Karle Plumber, PharmD   Please be sure to bring in all your medications bottles to every appointment.    Thank you for choosing [facility]

## 2023-01-08 NOTE — Progress Notes (Signed)
ReDS Vest / Clip - 01/08/23 1100       ReDS Vest / Clip   Station Marker A    Ruler Value 27    ReDS Value Range Low volume    ReDS Actual Value 26

## 2023-01-08 NOTE — Telephone Encounter (Signed)
-----   Message from [place] sent at 01/08/2023  2:20 PM EST ----- BNP elevated above baseline  Increase Lasix to 40 mg daily, start 20 KCL daily.  Repeat BMET and BNP in 10-14 days

## 2023-01-08 NOTE — Telephone Encounter (Signed)
Spoke with patient regarding the following results. Patient made aware and patient verbalized understanding.   Patient aware of all instructions and verbalized understanding.   Medications sent to patient preferred pharmacy.   Patient would like labs done in [place] at [facility]- patient will go there in 10 days.    Advised patient to call back to office with any issues, questions, or concerns. Patient verbalized understanding.

## 2023-01-08 NOTE — Progress Notes (Unsigned)
Cardiology Office Note:  .   Date:  01/08/2023  ID:  [name], DOB June 04, 1940, MRN [id number] PCP: [name], MD  [facility] Providers AHF: Dr. Shirlee Latch EP: Dr. Graciela Husbands Cardiologist:  Marjo Bicker, MD {  History of Present Illness: .   Heather [name] is a 82 y.o. female w/PMHx of CKD (IIIb), COPD (follows with Dr. Craige Cotta), OSA (intolerant of CPAP), HTN, LBBB, stroke, chronic CHF (biVe failure), NICM [facility] 10/2021 which showed mild nonobstructive CAD. 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 Graciela Husbands 05/24/22, planned for CRT-P  Saw HF team 08/17/22, doing better post device implant, able to do ADLs, planned for labs, dig level, and an echo at her next visit   Saw Dr. Jenene Slicker 08/18/22, doing well, referred to C. Rehab  Device clinic alert for AMS >> 2/2 FF, inappropriate, industry recs PVAB be changed to 180-128ms  Low BP s reported to HF team, advised reduction in hydralazine dose  I saw her 11/17/22 She feels much better! She reports her breathing much better, no rest SOB, no nocturnal or positional SOB. She is very much enjoying cardiac rehab and reports she is up to 26 laps around the exercise room! No device concerns No CP, palpitations or cardiac awareness No dizzy spells, near syncope or syncope She reports intolerant of CPAP, that dr. Craige Cotta is arranging HS oxygen P waves were small, otherwise stable findings False AMS 2/2 FF and PVAB adjusted Outputs adjusted to clinic standard/w/safety margin   She saw Dr. Shirlee Latch 11/27/22, continued to do better, noted PVCs on EKG.  Bisoprolol increased, planned monitor for PVC burden  16.6% PVC burden and started on amiodarone  Saw HF team APP 01/08/23, doing well, continued in c.Rehab, DOE when walking fast, able to walk through [facility] without stopping now C/o symptoms of a yeast infection and treated  Planned a f/u monitor on  amiodarone Advised to f/u with PMD/GYN, if recurrent infection would need to stop SGLT2i  Labs noted >> lasix increased and K+ started, pending f/u labs   Today's visit is scheduled as a 6-8 week follow up on A channel numbers  ROS:   She is doing well She has noticed an increase in urine OP with the higher lasix dose, edema a bit better and down 2lbs by her home weights  No CP SOB overall remains better, some intermittently No dizzy spells, near syncope or syncope No palpitations   Device information [employer]  PPM  implanted 07/12/22 leads in RV apex and septal positions by xray (and an RA lead)  AAD hx Amiodarone started Oct 2024, PVCs   Studies Reviewed: [name]    EKG not done today    DEVICE interrogation done today and reviewed by myself Battery and lead measurements are good No arrhythmias, no FF No changes made She is atrially dependent today   Oct 2024, monitor Conclusion: 1. Predominantly NSR 2. 16.6% PVCs   11/27/22: TTE 1. Left ventricular ejection fraction, by estimation, is 20 to 25%. The  left ventricle has severely decreased function. The left ventricle  demonstrates global hypokinesis. The left ventricular internal cavity size  was mildly dilated. Left ventricular  diastolic parameters are consistent with Grade I diastolic dysfunction  (impaired relaxation). No LV thrombus.   2. Right ventricular systolic function is moderately reduced. The right  ventricular size is normal. There is normal pulmonary artery systolic  pressure. The estimated  right ventricular systolic pressure is 26.4 mmHg.   3. Left atrial size was moderately dilated.   4. The mitral valve is normal in structure. Trivial mitral valve  regurgitation. No evidence of mitral stenosis.   5. The aortic valve is tricuspid. There is mild calcification of the  aortic valve. Aortic valve regurgitation is not visualized. No aortic  stenosis is present.   6. The inferior vena cava is normal in  size with greater than 50%  respiratory variability, suggesting right atrial pressure of 3 mmHg.     07/03/22: TTE 1. Left ventricular ejection fraction, by estimation, is <20%. The left  ventricle has severely decreased function. The left ventricle demonstrates  global hypokinesis with septal-lateral dyssynchrony consistent with LBBB.  The left ventricular internal  cavity size was moderately dilated. Left ventricular diastolic parameters  are consistent with Grade I diastolic dysfunction (impaired relaxation).   2. Right ventricular systolic function is moderately reduced. The right  ventricular size is mildly enlarged. There is severely elevated pulmonary  artery systolic pressure. The estimated right ventricular systolic  pressure is 65.2 mmHg.   3. Left atrial size was moderately dilated.   4. The mitral valve is normal in structure. Trivial mitral valve  regurgitation. No evidence of mitral stenosis.   5. The aortic valve is tricuspid. There is moderate calcification of the  aortic valve. Aortic valve regurgitation is not visualized. No aortic  stenosis is present.   6. The inferior vena cava is normal in size with <50% respiratory  variability, suggesting right atrial pressure of 8 mmHg.    Cardiac MRI (1/24): LVEF 16%, RVEF 23%, mid-apical inferolateral LGE in prior infarction pattern (looks like prior MI but does not explain extent of CMP).    Risk Assessment/Calculations:    Physical Exam:   VS:  There were no vitals taken for this visit.   Wt Readings from Last 3 Encounters:  01/08/23 145 lb 12.8 oz (66.1 kg)  12/28/22 142 lb (64.4 kg)  12/04/22 138 lb 6.4 oz (62.8 kg)    GEN: Well nourished, well developed in no acute distress NECK: No JVD; No carotid bruits CARDIAC:  RRR, no murmurs, rubs, gallops RESPIRATORY:  CTA b/l without rales, wheezing or rhonchi  ABDOMEN: Soft, non-tender, non-distended EXTREMITIES: wearing compression hose, trace-1+ LE edema; No  deformity   PPM site: is stable/well healed, no thinning, fluctuation, tethering  ASSESSMENT AND PLAN: .    CRT-P (LV lead is in LB/septal position, RV lead in apex by report) programmed LV > RV by 60 Intact function No programming changes made 92%BP Pending repeat monitor for PVCs (wearing currently)  NICM Chronic CHF  Symptomatically feels much better Recent increase in her lasix by HF team, she reports good response CoeVue heading back up C/w HF/Dr. Mallipeddi teams  4. PVCs Amiodarone Labs are UTD      Dispo: 53mo with EP, sooner if needed, suspect we can move out to annual device/EP visits after that  Signed, Sheilah Pigeon, PA-C

## 2023-01-10 ENCOUNTER — Telehealth: Payer: Self-pay | Admitting: Internal Medicine

## 2023-01-10 NOTE — Telephone Encounter (Signed)
Patient scheduled Monday 01/15/2023

## 2023-01-10 NOTE — Telephone Encounter (Signed)
Pt called in wants a call back   Cardiologist is recommending pt have sooner appt with pcp Thinks pt may be having a medication reaction.,  Did not specify in to which medication,.  Cardiologist is asking pcp to change medication.

## 2023-01-11 ENCOUNTER — Encounter: Payer: Self-pay | Admitting: Orthopedic Surgery

## 2023-01-11 ENCOUNTER — Ambulatory Visit (INDEPENDENT_AMBULATORY_CARE_PROVIDER_SITE_OTHER): Payer: Medicare Other

## 2023-01-11 ENCOUNTER — Telehealth: Payer: Self-pay

## 2023-01-11 ENCOUNTER — Encounter: Payer: Self-pay | Admitting: Physician Assistant

## 2023-01-11 ENCOUNTER — Ambulatory Visit: Payer: Medicare Other | Attending: Physician Assistant | Admitting: Physician Assistant

## 2023-01-11 ENCOUNTER — Ambulatory Visit: Payer: Medicare Other | Admitting: Orthopedic Surgery

## 2023-01-11 VITALS — BP 104/61 | HR 60 | Ht 62.0 in | Wt 141.0 lb

## 2023-01-11 VITALS — BP 117/63 | HR 60 | Ht 62.5 in | Wt 144.0 lb

## 2023-01-11 DIAGNOSIS — T84498S Other mechanical complication of other internal orthopedic devices, implants and grafts, sequela: Secondary | ICD-10-CM | POA: Diagnosis not present

## 2023-01-11 DIAGNOSIS — Z950 Presence of cardiac pacemaker: Secondary | ICD-10-CM

## 2023-01-11 DIAGNOSIS — I493 Ventricular premature depolarization: Secondary | ICD-10-CM | POA: Diagnosis not present

## 2023-01-11 DIAGNOSIS — M545 Low back pain, unspecified: Secondary | ICD-10-CM | POA: Diagnosis not present

## 2023-01-11 DIAGNOSIS — I428 Other cardiomyopathies: Secondary | ICD-10-CM | POA: Diagnosis not present

## 2023-01-11 DIAGNOSIS — I5022 Chronic systolic (congestive) heart failure: Secondary | ICD-10-CM | POA: Diagnosis not present

## 2023-01-11 LAB — CUP PACEART INCLINIC DEVICE CHECK
Battery Remaining Longevity: 84 mo
Battery Voltage: 2.99 V
Brady Statistic RA Percent Paced: 37 %
Brady Statistic RV Percent Paced: 92 %
Date Time Interrogation Session: 20241107114921
Implantable Lead Connection Status: 753985
Implantable Lead Connection Status: 753985
Implantable Lead Connection Status: 753985
Implantable Lead Implant Date: 20240508
Implantable Lead Implant Date: 20240508
Implantable Lead Implant Date: 20240508
Implantable Lead Location: 753858
Implantable Lead Location: 753859
Implantable Lead Location: 753860
Implantable Lead Model: 3830
Implantable Pulse Generator Implant Date: 20240508
Lead Channel Impedance Value: 512.5 Ohm
Lead Channel Impedance Value: 612.5 Ohm
Lead Channel Impedance Value: 612.5 Ohm
Lead Channel Pacing Threshold Amplitude: 0.5 V
Lead Channel Pacing Threshold Amplitude: 0.5 V
Lead Channel Pacing Threshold Amplitude: 0.75 V
Lead Channel Pacing Threshold Amplitude: 0.75 V
Lead Channel Pacing Threshold Amplitude: 0.75 V
Lead Channel Pacing Threshold Amplitude: 0.75 V
Lead Channel Pacing Threshold Pulse Width: 0.5 ms
Lead Channel Pacing Threshold Pulse Width: 0.5 ms
Lead Channel Pacing Threshold Pulse Width: 0.5 ms
Lead Channel Pacing Threshold Pulse Width: 0.5 ms
Lead Channel Pacing Threshold Pulse Width: 0.5 ms
Lead Channel Pacing Threshold Pulse Width: 0.5 ms
Lead Channel Sensing Intrinsic Amplitude: 0.6 mV
Lead Channel Sensing Intrinsic Amplitude: 12 mV
Lead Channel Setting Pacing Amplitude: 2.5 V
Lead Channel Setting Pacing Amplitude: 2.5 V
Lead Channel Setting Pacing Amplitude: 2.5 V
Lead Channel Setting Pacing Pulse Width: 0.5 ms
Lead Channel Setting Pacing Pulse Width: 0.5 ms
Lead Channel Setting Sensing Sensitivity: 2 mV
Pulse Gen Model: 3222
Pulse Gen Serial Number: 8167222

## 2023-01-11 LAB — CUP PACEART REMOTE DEVICE CHECK
Battery Remaining Longevity: 79 mo
Battery Remaining Percentage: 93 %
Battery Voltage: 2.99 V
Brady Statistic AP VP Percent: 99 %
Brady Statistic AP VS Percent: 0 %
Brady Statistic AS VP Percent: 1 %
Brady Statistic AS VS Percent: 0 %
Brady Statistic RA Percent Paced: 99 %
Date Time Interrogation Session: 20241107113729
Implantable Lead Connection Status: 753985
Implantable Lead Connection Status: 753985
Implantable Lead Connection Status: 753985
Implantable Lead Implant Date: 20240508
Implantable Lead Implant Date: 20240508
Implantable Lead Implant Date: 20240508
Implantable Lead Location: 753858
Implantable Lead Location: 753859
Implantable Lead Location: 753860
Implantable Lead Model: 3830
Implantable Pulse Generator Implant Date: 20240508
Lead Channel Impedance Value: 510 Ohm
Lead Channel Impedance Value: 610 Ohm
Lead Channel Impedance Value: 610 Ohm
Lead Channel Pacing Threshold Amplitude: 0.5 V
Lead Channel Pacing Threshold Amplitude: 0.75 V
Lead Channel Pacing Threshold Amplitude: 0.75 V
Lead Channel Pacing Threshold Pulse Width: 0.5 ms
Lead Channel Pacing Threshold Pulse Width: 0.5 ms
Lead Channel Pacing Threshold Pulse Width: 0.5 ms
Lead Channel Sensing Intrinsic Amplitude: 0.6 mV
Lead Channel Sensing Intrinsic Amplitude: 12 mV
Lead Channel Setting Pacing Amplitude: 2.5 V
Lead Channel Setting Pacing Amplitude: 2.5 V
Lead Channel Setting Pacing Amplitude: 2.5 V
Lead Channel Setting Pacing Pulse Width: 0.5 ms
Lead Channel Setting Pacing Pulse Width: 0.5 ms
Lead Channel Setting Sensing Sensitivity: 2 mV
Pulse Gen Model: 3222
Pulse Gen Serial Number: 8167222

## 2023-01-11 NOTE — Telephone Encounter (Signed)
Pt seeing Cardiology today but asking for refills on her inhalers prescribed by Pulmonary... will forward note to their dept for review and management.

## 2023-01-11 NOTE — Patient Instructions (Addendum)
Medication Instructions:   *If you need a refill on your cardiac medications before your next appointment, please call your pharmacy*   Lab Work:  If you have labs (blood work) drawn today and your tests are completely normal, you will receive your results only by: MyChart Message (if you have MyChart) OR A paper copy in the mail If you have any lab test that is abnormal or we need to change your treatment, we will call you to review the results.   Testing/Procedures:    Follow-Up: At [facility], you and your health needs are our priority.  As part of our continuing mission to provide you with exceptional heart care, we have created designated Provider Care Teams.  These Care Teams include your primary Cardiologist (physician) and Advanced Practice Providers (APPs -  Physician Assistants and Nurse Practitioners) who all work together to provide you with the care you need, when you need it.  We recommend signing up for the patient portal called "MyChart".  Sign up information is provided on this After Visit Summary.  MyChart is used to connect with patients for Virtual Visits (Telemedicine).  Patients are able to view lab/test results, encounter notes, upcoming appointments, etc.  Non-urgent messages can be sent to your provider as well.   To learn more about what you can do with MyChart, go to [email or web address].    Your next appointment:   6 month(s)  ANYTHING THAT SAYS "REMOTE" JUST MEANS THEY WILL CHECK YOUR DEVICE FROM HOME YOU DO NOT HAVE TO COME TO THE OFFICE.   Provider:   Francis Dowse PA     Other Instructions

## 2023-01-11 NOTE — Progress Notes (Signed)
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

## 2023-01-12 MED ORDER — ALBUTEROL SULFATE HFA 108 (90 BASE) MCG/ACT IN AERS: 2.0000 | INHALATION_SPRAY | Freq: Four times a day (QID) | RESPIRATORY_TRACT | 3 refills | Status: DC | PRN

## 2023-01-12 NOTE — Telephone Encounter (Signed)
Called patient.  Patient needs refill on her [name].  Patients last OV was 10/16/2022 with Dr. Craige Cotta.  Per office notes: Follow up in 6 months   Will send in refill x 4.  Patient requesting refill to go to [facility].

## 2023-01-15 ENCOUNTER — Ambulatory Visit (INDEPENDENT_AMBULATORY_CARE_PROVIDER_SITE_OTHER): Payer: Medicare Other | Admitting: Internal Medicine

## 2023-01-15 ENCOUNTER — Encounter: Payer: Self-pay | Admitting: Internal Medicine

## 2023-01-15 VITALS — BP 90/54 | HR 103 | Ht 62.0 in | Wt 136.4 lb

## 2023-01-15 DIAGNOSIS — B3731 Acute candidiasis of vulva and vagina: Secondary | ICD-10-CM | POA: Diagnosis not present

## 2023-01-15 DIAGNOSIS — I5022 Chronic systolic (congestive) heart failure: Secondary | ICD-10-CM

## 2023-01-15 DIAGNOSIS — F5101 Primary insomnia: Secondary | ICD-10-CM | POA: Insufficient documentation

## 2023-01-15 MED ORDER — KETOCONAZOLE 2 % EX CREA
1.0000 | TOPICAL_CREAM | Freq: Every day | CUTANEOUS | 0 refills | Status: DC
Start: 2023-01-15 — End: 2023-06-03

## 2023-01-15 MED ORDER — ZOLPIDEM TARTRATE 5 MG PO TABS: 5.0000 mg | ORAL_TABLET | Freq: Every evening | ORAL | 0 refills | Status: DC | PRN

## 2023-01-15 MED ORDER — FLUCONAZOLE 150 MG PO TABS
150.0000 mg | ORAL_TABLET | Freq: Once | ORAL | 0 refills | Status: AC
Start: 2023-01-15 — End: 2023-01-15

## 2023-01-15 NOTE — Progress Notes (Signed)
Established Patient Office Visit  Subjective:  Patient ID: Heather [name], female    DOB: Sep 27, 1940  Age: 82 y.o. MRN: [id number]  CC:  Chief Complaint  Patient presents with   Medication Reaction    Cardiologist feels patient is having a reaction to farxiga    Insomnia    Patient can not sleep     HPI Heather [name] is a 82 y.o. female with past medical history of HTN, CHF, COPD, type II DM and CKD who presents for c/o rash and insomnia.  She complains of perineal area rash and whitish vaginal discharge for the last 2 weeks.  Of note, she takes [place] and was told by cardiologist that it could be due to it.  She takes [place] for CHF.  She denies any fever, chills or vaginal bleeding.  Denies any dysuria or hematuria.  She also complains of chronic insomnia.  She has tried OTC melatonin, antihistaminic and trazodone in the past.  She has also tried Ambien, but prefers to try it again.  Denies anhedonia, SI or HI currently.  Past Medical History:  Diagnosis Date   Anemia    Anxiety    Arthritis    COPD (chronic obstructive pulmonary disease) (HCC)    CVA (cerebral vascular accident) (HCC)    Depression    Dry eyes 04/14/2014   GERD (gastroesophageal reflux disease)    Glaucoma    Gout    HTN (hypertension)    OSA (obstructive sleep apnea)     Past Surgical History:  Procedure Laterality Date   ABDOMINAL HYSTERECTOMY     BACK SURGERY     lumbar-disc   BIV PACEMAKER INSERTION CRT-P N/A 07/12/2022   Procedure: BIV PACEMAKER INSERTION CRT-P;  Surgeon: Duke Salvia, MD;  Location: [facility] INVASIVE CV LAB;  Service: Cardiovascular;  Laterality: N/A;   BUNIONECTOMY Bilateral    FOOT SURGERY Left    repair of "kissing Cousins"   JOINT REPLACEMENT Bilateral    hip    JOINT REPLACEMENT Right 2010 or 2012   knee   KNEE SURGERY     RIGHT/LEFT HEART CATH AND CORONARY ANGIOGRAPHY N/A 10/27/2021   Procedure: RIGHT/LEFT HEART CATH AND CORONARY ANGIOGRAPHY;  Surgeon: Corky Crafts, MD;  Location: [facility] INVASIVE CV LAB;  Service: Cardiovascular;  Laterality: N/A;   SHOULDER ARTHROSCOPY Right    shoulder surgery in [place] about 2000   SHOULDER HEMI-ARTHROPLASTY Left 03/25/2014   Procedure: LEFT SHOULDER HEMI-ARTHROPLASTY;  Surgeon: Vickki Hearing, MD;  Location: [facility];  Service: Orthopedics;  Laterality: Left;    Family History  Problem Relation Age of Onset   High blood pressure Mother    Aneurysm Father    Diabetes Brother    Pancreatitis Brother    Hypertension Niece    Congestive Heart Failure Niece     Social History   Socioeconomic History   Marital status: Divorced    Spouse name: Not on file   Number of children: Not on file   Years of education: Not on file   Highest education level: 9th grade  Occupational History   Not on file  Tobacco Use   Smoking status: Former    Current packs/day: 0.00    Average packs/day: 1 pack/day for 40.0 years (40.0 ttl pk-yrs)    Types: Cigarettes    Start date: 03/20/1945    Quit date: 03/20/1985    Years since quitting: 37.8   Smokeless tobacco: Never  Vaping Use  Vaping status: Never Used  Substance and Sexual Activity   Alcohol use: No   Drug use: No   Sexual activity: Yes    Birth control/protection: Surgical  Other Topics Concern   Not on file  Social History Narrative   Mrs Kendal is a 82 year old divorced female who lives alone. She has support of her son and sister.    Social Determinants of Health   Financial Resource Strain: Medium Risk (11/27/2022)   Overall Financial Resource Strain (CARDIA)    Difficulty of Paying Living Expenses: Somewhat hard  Food Insecurity: No Food Insecurity (11/27/2022)   Hunger Vital Sign    Worried About Running Out of Food in the Last Year: Never true    Ran Out of Food in the Last Year: Never true  Recent Concern: Food Insecurity - Food Insecurity Present (11/21/2022)   Hunger Vital Sign    Worried About Running Out of Food in the Last Year: Never  true    Ran Out of Food in the Last Year: Sometimes true  Transportation Needs: Unmet Transportation Needs (11/27/2022)   PRAPARE - Administrator, Civil Service (Medical): Yes    Lack of Transportation (Non-Medical): No  Physical Activity: Insufficiently Active (11/27/2022)   Exercise Vital Sign    Days of Exercise per Week: 3 days    Minutes of Exercise per Session: 40 min  Stress: Stress Concern Present (11/27/2022)   [employer] of Occupational Health - Occupational Stress Questionnaire    Feeling of Stress : To some extent  Social Connections: Moderately Isolated (11/27/2022)   Social Connection and Isolation Panel [NHANES]    Frequency of Communication with Friends and Family: More than three times a week    Frequency of Social Gatherings with Friends and Family: More than three times a week    Attends Religious Services: More than 4 times per year    Active Member of [employer] or Organizations: No    Attends Banker Meetings: Never    Marital Status: Widowed  Intimate Partner Violence: Not At Risk (11/09/2022)   Humiliation, Afraid, Rape, and Kick questionnaire    Fear of Current or Ex-Partner: No    Emotionally Abused: No    Physically Abused: No    Sexually Abused: No    Outpatient Medications Prior to Visit  Medication Sig Dispense Refill   acetaminophen (TYLENOL) 500 MG tablet Take 500 mg by mouth every 6 (six) hours as needed for mild pain or moderate pain.     albuterol ([name]) 108 (90 Base) MCG/ACT inhaler Inhale 2 puffs into the lungs every 6 (six) hours as needed for wheezing or shortness of breath. 18 g 3   allopurinol (ZYLOPRIM) 300 MG tablet Take 0.5 tablets (150 mg total) by mouth daily. 45 tablet 0   amiodarone (PACERONE) 200 MG tablet Take 1 tablet (200 mg total) by mouth 2 (two) times daily for 14 days, THEN 1 tablet (200 mg total) daily. (Patient taking differently: Patient takes 1 tablet by mouth daily.) 90 tablet 3   aspirin  (ASPIRIN CHILDRENS) 81 MG chewable tablet Chew 1 tablet (81 mg total) by mouth daily. 90 tablet 3   atorvastatin (LIPITOR) 20 MG tablet Take 1 tablet (20 mg total) by mouth daily. 90 tablet 3   bisoprolol (ZEBETA) 5 MG tablet Take 1 tablet (5 mg total) by mouth daily. 90 tablet 3   chlorpheniramine (CHLOR-TRIMETON) 4 MG tablet Take 4 mg by mouth daily as needed  for allergies.     Cholecalciferol (DIALYVITE VITAMIN D 5000 PO) Take 5,000 Units by mouth daily.     dapagliflozin propanediol (FARXIGA) 10 MG TABS tablet Take 1 tablet (10 mg total) by mouth daily. 90 tablet 3   digoxin (LANOXIN) 0.125 MG tablet Take 0.5 tablets (0.0625 mg total) by mouth every other day. 45 tablet 3   famotidine (PEPCID) 20 MG tablet Take 1 tablet (20 mg total) by mouth at bedtime. One after supper 30 tablet 3   Fluticasone-Umeclidin-Vilant (TRELEGY ELLIPTA) 200-62.5-25 MCG/ACT AEPB Inhale 1 puff into the lungs daily. 60 each 3   furosemide (LASIX) 40 MG tablet Take 1 tablet (40 mg total) by mouth daily. 30 tablet 3   hydrALAZINE (APRESOLINE) 25 MG tablet Take 0.5 tablets (12.5 mg total) by mouth 3 (three) times daily. 90 tablet 3   HYDROcodone-acetaminophen (NORCO/VICODIN) 5-325 MG tablet Take 1 tablet by mouth every 6 (six) hours as needed for moderate pain. 120 tablet 0   isosorbide mononitrate (IMDUR) 30 MG 24 hr tablet Take 0.5 tablets (15 mg total) by mouth daily. 45 tablet 3   Multiple Vitamins-Minerals (MULTIVITAMIN WITH MINERALS) tablet Take 1 tablet by mouth daily.     potassium chloride SA (KLOR-CON M) 20 MEQ tablet Take 1 tablet (20 mEq total) by mouth daily. 30 tablet 3   Propylene Glycol (SYSTANE COMPLETE) 0.6 % SOLN Place 1 drop into both eyes 2 (two) times daily.     spironolactone (ALDACTONE) 25 MG tablet Take 1 tablet (25 mg total) by mouth daily. 30 tablet 5   venlafaxine XR (EFFEXOR-XR) 75 MG 24 hr capsule Take 1 capsule (75 mg total) by mouth See admin instructions. 90 capsule 5   ketoconazole  (NIZORAL) 2 % cream Apply 1 Application topically as needed for irritation.     No facility-administered medications prior to visit.    Allergies  Allergen Reactions   Lovastatin Swelling    Patient states that her tongue swells and she gets a tingling feeling all over. Patient states that her tongue swells and she gets a tingling feeling all over.   Lisinopril Swelling    Tongue swelling    ROS Review of Systems  Constitutional:  Negative for chills and fever.  HENT:  Negative for congestion, sinus pressure, sinus pain and sore throat.   Eyes:  Negative for pain and discharge.  Respiratory:  Negative for cough and shortness of breath.   Cardiovascular:  Negative for chest pain and palpitations.  Gastrointestinal:  Negative for abdominal pain, diarrhea, nausea and vomiting.  Endocrine: Negative for polydipsia and polyuria.  Genitourinary:  Positive for vaginal discharge. Negative for dysuria and hematuria.  Musculoskeletal:  Negative for neck pain and neck stiffness.  Skin:  Positive for rash.  Neurological:  Negative for dizziness and weakness.  Psychiatric/Behavioral:  Positive for sleep disturbance. Negative for agitation and behavioral problems.       Objective:    Physical Exam Vitals reviewed.  Constitutional:      General: She is not in acute distress.    Appearance: She is not diaphoretic.  HENT:     Head: Normocephalic and atraumatic.     Nose: Nose normal. No congestion.     Mouth/Throat:     Mouth: Mucous membranes are moist.     Pharynx: No posterior oropharyngeal erythema.  Eyes:     General: No scleral icterus.    Extraocular Movements: Extraocular movements intact.  Cardiovascular:     Rate and Rhythm: Normal rate  and regular rhythm.     Heart sounds: No murmur heard. Pulmonary:     Breath sounds: Normal breath sounds. No wheezing or rales.  Musculoskeletal:     Cervical back: Neck supple. No tenderness.     Right lower leg: No edema.     Left  lower leg: No edema.  Skin:    General: Skin is warm.     Findings: Rash (Patient reported erythematous rash in groin and perineal area) present.  Neurological:     General: No focal deficit present.     Mental Status: She is alert and oriented to person, place, and time.  Psychiatric:        Mood and Affect: Mood normal.        Behavior: Behavior normal.     BP (!) 90/54 (BP Location: Right Arm, Patient Position: Sitting, Cuff Size: Normal)   Pulse (!) 103   Ht 5\' 2"  (1.575 m)   Wt 136 lb 6.4 oz (61.9 kg)   SpO2 90%   BMI 24.95 kg/m  Wt Readings from Last 3 Encounters:  01/15/23 136 lb 6.4 oz (61.9 kg)  01/11/23 141 lb (64 kg)  01/11/23 144 lb (65.3 kg)    Lab Results  Component Value Date   TSH 2.096 01/08/2023   Lab Results  Component Value Date   WBC 6.7 07/05/2022   HGB 11.4 07/05/2022   HCT 35.6 07/05/2022   MCV 94 07/05/2022   PLT 385 07/05/2022   Lab Results  Component Value Date   NA 142 01/08/2023   K 4.3 01/08/2023   CO2 22 01/08/2023   GLUCOSE 96 01/08/2023   BUN 45 (H) 01/08/2023   CREATININE 2.03 (H) 01/08/2023   BILITOT 0.5 01/08/2023   ALKPHOS 72 01/08/2023   AST 38 01/08/2023   ALT 49 (H) 01/08/2023   PROT 6.4 (L) 01/08/2023   ALBUMIN 3.6 01/08/2023   CALCIUM 9.0 01/08/2023   ANIONGAP 11 01/08/2023   EGFR 34 (L) 12/12/2022   Lab Results  Component Value Date   CHOL 172 08/17/2022   Lab Results  Component Value Date   HDL 82 08/17/2022   Lab Results  Component Value Date   LDLCALC 81 08/17/2022   Lab Results  Component Value Date   TRIG 45 08/17/2022   Lab Results  Component Value Date   CHOLHDL 2.1 08/17/2022   Lab Results  Component Value Date   HGBA1C 6.5 (H) 05/26/2022      Assessment & Plan:   Problem List Items Addressed This Visit       Cardiovascular and Mediastinum   Chronic systolic HF (heart failure) (HCC) (Chronic)    Appears euvolemic currently on GDMT-bisoprolol, Farxiga, Imdur, hydralazine,  spironolactone and Lasix An SGLT2 inhibitor such as Marcelline Deist can lead to vulvovaginitis, but would prefer to continue if it is first episode Can consider discontinuing Marcelline Deist if recurrent vulvovaginitis        Genitourinary   Candidal vaginitis    Prescribed fluconazole single dose Ketoconazole cream prescribed for intertrigo Keep area clean and dry Maintain adequate hydration      Relevant Medications   fluconazole (DIFLUCAN) 150 MG tablet   ketoconazole (NIZORAL) 2 % cream     Other   Primary insomnia - Primary    Chronic insomnia Considering her history of CHF and she takes amiodarone, would prefer to avoid QT prolonging agent Has tried trazodone, would avoid Elavil Started Ambien 5 mg at bedtime - advised to try half tablet  at bedtime for 1 week      Relevant Medications   zolpidem (AMBIEN) 5 MG tablet    Meds ordered this encounter  Medications   zolpidem (AMBIEN) 5 MG tablet    Sig: Take 1 tablet (5 mg total) by mouth at bedtime as needed for sleep.    Dispense:  30 tablet    Refill:  0   fluconazole (DIFLUCAN) 150 MG tablet    Sig: Take 1 tablet (150 mg total) by mouth once for 1 dose.    Dispense:  1 tablet    Refill:  0   ketoconazole (NIZORAL) 2 % cream    Sig: Apply 1 Application topically daily.    Dispense:  15 g    Refill:  0    Follow-up: Return if symptoms worsen or fail to improve.    Anabel Halon, MD

## 2023-01-15 NOTE — Assessment & Plan Note (Signed)
Chronic insomnia Considering her history of CHF and she takes amiodarone, would prefer to avoid QT prolonging agent Has tried trazodone, would avoid Elavil Started Ambien 5 mg at bedtime - advised to try half tablet at bedtime for 1 week

## 2023-01-15 NOTE — Assessment & Plan Note (Signed)
Appears euvolemic currently on GDMT-bisoprolol, Farxiga, Imdur, hydralazine, spironolactone and Lasix An SGLT2 inhibitor such as Marcelline Deist can lead to vulvovaginitis, but would prefer to continue if it is first episode Can consider discontinuing Marcelline Deist if recurrent vulvovaginitis

## 2023-01-15 NOTE — Assessment & Plan Note (Signed)
Prescribed fluconazole single dose Ketoconazole cream prescribed for intertrigo Keep area clean and dry Maintain adequate hydration

## 2023-01-15 NOTE — Patient Instructions (Signed)
Please take Fluconazole 150 mg once. Please apply Ketoconazole cream in the groin area.  Please start taking Ambien as needed for insomnia, start with half tablet at bedtime. If persistent insomnia after 1 week, can take whole tablet.

## 2023-01-17 ENCOUNTER — Telehealth: Payer: Self-pay

## 2023-01-17 ENCOUNTER — Telehealth (HOSPITAL_COMMUNITY): Payer: Self-pay

## 2023-01-17 DIAGNOSIS — I5022 Chronic systolic (congestive) heart failure: Secondary | ICD-10-CM

## 2023-01-17 NOTE — Telephone Encounter (Signed)
Per JM Called both patients phone numbers and they were invalid. In addition, called pt's brother [name] and left a detailed voice message for patient to give our office a call to get scheduled in 1 week for a lab appointment for a B-met and Bnp. Lab orders has been placed.

## 2023-01-17 NOTE — Telephone Encounter (Signed)
Heather [name] wanted to know when does she need to go the lab for blood work? Please call her and advise. Thank you.  Call back phone  [phone].

## 2023-01-18 ENCOUNTER — Telehealth (HOSPITAL_COMMUNITY): Payer: Self-pay

## 2023-01-18 NOTE — Telephone Encounter (Signed)
Received call from patient requesting lab appointment. Spoke with patient, attempted to get her scheduled for labs today/tomorrow here- patient would like them done in [place]. [facility] of [facility] location in [place] and states she will go there tomorrow to have this done. Advised her to call back with any issues. Patient verbalized understanding.   [facility] orders are in.

## 2023-01-19 DIAGNOSIS — I493 Ventricular premature depolarization: Secondary | ICD-10-CM | POA: Diagnosis not present

## 2023-01-22 ENCOUNTER — Other Ambulatory Visit (HOSPITAL_COMMUNITY): Payer: Self-pay | Admitting: Cardiology

## 2023-01-22 DIAGNOSIS — I5022 Chronic systolic (congestive) heart failure: Secondary | ICD-10-CM

## 2023-01-23 ENCOUNTER — Telehealth (HOSPITAL_COMMUNITY): Payer: Self-pay | Admitting: *Deleted

## 2023-01-23 DIAGNOSIS — I5022 Chronic systolic (congestive) heart failure: Secondary | ICD-10-CM

## 2023-01-23 LAB — BASIC METABOLIC PANEL
BUN/Creatinine Ratio: 30 — ABNORMAL HIGH (ref 12–28)
BUN: 82 mg/dL (ref 8–27)
CO2: 18 mmol/L — ABNORMAL LOW (ref 20–29)
Calcium: 9.4 mg/dL (ref 8.7–10.3)
Chloride: 103 mmol/L (ref 96–106)
Creatinine, Ser: 2.74 mg/dL — ABNORMAL HIGH (ref 0.57–1.00)
Glucose: 83 mg/dL (ref 70–99)
Potassium: 5.1 mmol/L (ref 3.5–5.2)
Sodium: 139 mmol/L (ref 134–144)
eGFR: 17 mL/min/{1.73_m2} — ABNORMAL LOW (ref >59)

## 2023-01-23 LAB — BRAIN NATRIURETIC PEPTIDE: BNP: 3479.4 pg/mL — ABNORMAL HIGH (ref 0.0–100.0)

## 2023-01-23 NOTE — Telephone Encounter (Signed)
Called patient per Heather Rome, NP with following lab results and instructions:  "Renal function declined. Hold losartan, lasix, KCL, Farxiga and spiro x 3 days, then may resume at home dose. Repeat BMET in 1 week. She needs a digoxin trough when she comes back for labs in 1 week, please make sure she knows to HOLD digoxin before labs are drawn"  Pt verbalized understanding of above. She requests labs be drawn at [facility]. Orders entered, asked patient to go first week of December for repeat labs and remember to hold digoxin morning of lab draw. Asked her to call [phone] if any questions or concerns.

## 2023-01-23 NOTE — Progress Notes (Signed)
Remote pacemaker transmission.   

## 2023-01-24 DIAGNOSIS — G4733 Obstructive sleep apnea (adult) (pediatric): Secondary | ICD-10-CM | POA: Diagnosis not present

## 2023-01-24 DIAGNOSIS — J441 Chronic obstructive pulmonary disease with (acute) exacerbation: Secondary | ICD-10-CM | POA: Diagnosis not present

## 2023-01-31 ENCOUNTER — Telehealth (HOSPITAL_COMMUNITY): Payer: Self-pay

## 2023-01-31 NOTE — Telephone Encounter (Addendum)
  Spoke to patient and aware of echocardiogram results. ----- Message from Marca Ancona sent at 01/28/2023 11:13 PM EST ----- No worrisome arrhythmias.

## 2023-02-07 ENCOUNTER — Other Ambulatory Visit (HOSPITAL_COMMUNITY)
Admission: RE | Admit: 2023-02-07 | Discharge: 2023-02-07 | Disposition: A | Discharge status: Home / Self Care | Payer: Medicare Other | Source: Ambulatory Visit | Attending: Cardiology | Admitting: Cardiology

## 2023-02-07 DIAGNOSIS — I5022 Chronic systolic (congestive) heart failure: Secondary | ICD-10-CM | POA: Insufficient documentation

## 2023-02-07 LAB — BASIC METABOLIC PANEL
Anion gap: 8 (ref 5–15)
BUN: 74 mg/dL — ABNORMAL HIGH (ref 8–23)
CO2: 21 mmol/L — ABNORMAL LOW (ref 22–32)
Calcium: 9.5 mg/dL (ref 8.9–10.3)
Chloride: 108 mmol/L (ref 98–111)
Creatinine, Ser: 2.28 mg/dL — ABNORMAL HIGH (ref 0.44–1.00)
GFR, Estimated: 21 mL/min — ABNORMAL LOW (ref >60)
Glucose, Bld: 103 mg/dL — ABNORMAL HIGH (ref 70–99)
Potassium: 4.9 mmol/L (ref 3.5–5.1)
Sodium: 137 mmol/L (ref 135–145)

## 2023-02-07 LAB — BRAIN NATRIURETIC PEPTIDE: B Natriuretic Peptide: 1778 pg/mL — ABNORMAL HIGH (ref 0.0–100.0)

## 2023-02-16 ENCOUNTER — Telehealth: Payer: Self-pay

## 2023-02-16 NOTE — Telephone Encounter (Signed)
Request for Hydrocodone 5-325   Filled  Written  ID  Drug  QTY  Days  Prescriber  RX #  Dispenser  Refill  Daily Dose*  Pymt Type  PMP  01/15/2023 01/15/2023 1  Zolpidem Tartrate 5 Mg Tablet 30.00 30 Ru Pat [id number] Car ([id number]) 0/0 0.25 LME Comm Ins [place] 10/19/2022 10/19/2022 1  Hydrocodone-Acetamin 5-325 Mg 120.00 30 Yu Sht [id number] Car ([id number]) 0/0 20.00 MME Medicare [place] 07/27/2022 07/27/2022 1  Hydrocodone-Acetamin 5-325 Mg 120.00 30 Yu Sht [id number] Car ([id number]) 0/0 20.00 MME Medicare [place] 06/13/2022 06/13/2022 1  Hydrocodone-Acetamin 5-325 Mg 120.00 30 Yu Sht [id number] Car [phone]) 0/0 20.0

## 2023-02-17 MED ORDER — HYDROCODONE-ACETAMINOPHEN 5-325 MG PO TABS: 1.0000 | ORAL_TABLET | Freq: Four times a day (QID) | ORAL | 0 refills | Status: DC | PRN

## 2023-02-23 DIAGNOSIS — J441 Chronic obstructive pulmonary disease with (acute) exacerbation: Secondary | ICD-10-CM | POA: Diagnosis not present

## 2023-02-23 DIAGNOSIS — G4733 Obstructive sleep apnea (adult) (pediatric): Secondary | ICD-10-CM | POA: Diagnosis not present

## 2023-02-26 ENCOUNTER — Encounter: Payer: Self-pay | Admitting: Registered Nurse

## 2023-02-26 ENCOUNTER — Encounter: Payer: Medicare Other | Attending: Physical Medicine & Rehabilitation | Admitting: Registered Nurse

## 2023-02-26 VITALS — BP 105/61 | HR 60 | Ht 62.0 in | Wt 132.6 lb

## 2023-02-26 DIAGNOSIS — Z5181 Encounter for therapeutic drug level monitoring: Secondary | ICD-10-CM | POA: Insufficient documentation

## 2023-02-26 DIAGNOSIS — G894 Chronic pain syndrome: Secondary | ICD-10-CM | POA: Insufficient documentation

## 2023-02-26 DIAGNOSIS — M5416 Radiculopathy, lumbar region: Secondary | ICD-10-CM | POA: Diagnosis not present

## 2023-02-26 DIAGNOSIS — Z79891 Long term (current) use of opiate analgesic: Secondary | ICD-10-CM | POA: Diagnosis not present

## 2023-02-26 NOTE — Patient Instructions (Signed)
My- Chart:   [phone]  

## 2023-02-26 NOTE — Progress Notes (Signed)
Subjective:    Patient ID: Heather [name], female    DOB: 04-28-1940, 82 y.o.   MRN: [id number]  HPI: Heather [name] is a 82 y.o. female who returns for follow up appointment for chronic pain and medication refill. She states her pain is located in her lower back radiating into her bilateral hips. She rates her pain 7. Her current exercise regime is walking and performing stretching exercises.  Heather [name] Morphine equivalent is 20.00 MME.   UDS was Ordered today.    Pain Inventory Average Pain 9 Pain Right Now 7 My pain is stabbing  In the last 24 hours, has pain interfered with the following? General activity 0 Relation with others 7 Enjoyment of life 8 What TIME of day is your pain at its worst? varies Sleep (in general) Fair  Pain is worse with: walking, bending, and standing Pain improves with: rest and medication Relief from Meds: 8  Family History  Problem Relation Age of Onset   High blood pressure Mother    Aneurysm Father    Diabetes Brother    Pancreatitis Brother    Hypertension Niece    Congestive Heart Failure Niece    Social History   Socioeconomic History   Marital status: Divorced    Spouse name: Not on file   Number of children: Not on file   Years of education: Not on file   Highest education level: 9th grade  Occupational History   Not on file  Tobacco Use   Smoking status: Former    Current packs/day: 0.00    Average packs/day: 1 pack/day for 40.0 years (40.0 ttl pk-yrs)    Types: Cigarettes    Start date: 03/20/1945    Quit date: 03/20/1985    Years since quitting: 37.9   Smokeless tobacco: Never  Vaping Use   Vaping status: Never Used  Substance and Sexual Activity   Alcohol use: No   Drug use: No   Sexual activity: Yes    Birth control/protection: Surgical  Other Topics Concern   Not on file  Social History Narrative   Heather [name] is a 82 year old divorced female who lives alone. She has support of her son and sister.    Social  Drivers of Health   Financial Resource Strain: Medium Risk (11/27/2022)   Overall Financial Resource Strain (CARDIA)    Difficulty of Paying Living Expenses: Somewhat hard  Food Insecurity: No Food Insecurity (11/27/2022)   Hunger Vital Sign    Worried About Running Out of Food in the Last Year: Never true    Ran Out of Food in the Last Year: Never true  Recent Concern: Food Insecurity - Food Insecurity Present (11/21/2022)   Hunger Vital Sign    Worried About Running Out of Food in the Last Year: Never true    Ran Out of Food in the Last Year: Sometimes true  Transportation Needs: Unmet Transportation Needs (11/27/2022)   PRAPARE - Administrator, Civil Service (Medical): Yes    Lack of Transportation (Non-Medical): No  Physical Activity: Insufficiently Active (11/27/2022)   Exercise Vital Sign    Days of Exercise per Week: 3 days    Minutes of Exercise per Session: 40 min  Stress: Stress Concern Present (11/27/2022)   [employer] of Occupational Health - Occupational Stress Questionnaire    Feeling of Stress : To some extent  Social Connections: Moderately Isolated (11/27/2022)   Social Connection and Isolation Panel [NHANES]  Frequency of Communication with Friends and Family: More than three times a week    Frequency of Social Gatherings with Friends and Family: More than three times a week    Attends Religious Services: More than 4 times per year    Active Member of [employer] or Organizations: No    Attends Banker Meetings: Never    Marital Status: Widowed   Past Surgical History:  Procedure Laterality Date   ABDOMINAL HYSTERECTOMY     BACK SURGERY     lumbar-disc   BIV PACEMAKER INSERTION CRT-P N/A 07/12/2022   Procedure: BIV PACEMAKER INSERTION CRT-P;  Surgeon: Duke Salvia, MD;  Location: [facility] INVASIVE CV LAB;  Service: Cardiovascular;  Laterality: N/A;   BUNIONECTOMY Bilateral    FOOT SURGERY Left    repair of "kissing Cousins"   JOINT  REPLACEMENT Bilateral    hip    JOINT REPLACEMENT Right 2010 or 2012   knee   KNEE SURGERY     RIGHT/LEFT HEART CATH AND CORONARY ANGIOGRAPHY N/A 10/27/2021   Procedure: RIGHT/LEFT HEART CATH AND CORONARY ANGIOGRAPHY;  Surgeon: Corky Crafts, MD;  Location: [facility] INVASIVE CV LAB;  Service: Cardiovascular;  Laterality: N/A;   SHOULDER ARTHROSCOPY Right    shoulder surgery in [place] about 2000   SHOULDER HEMI-ARTHROPLASTY Left 03/25/2014   Procedure: LEFT SHOULDER HEMI-ARTHROPLASTY;  Surgeon: Vickki Hearing, MD;  Location: [facility];  Service: Orthopedics;  Laterality: Left;   Past Surgical History:  Procedure Laterality Date   ABDOMINAL HYSTERECTOMY     BACK SURGERY     lumbar-disc   BIV PACEMAKER INSERTION CRT-P N/A 07/12/2022   Procedure: BIV PACEMAKER INSERTION CRT-P;  Surgeon: Duke Salvia, MD;  Location: [facility] INVASIVE CV LAB;  Service: Cardiovascular;  Laterality: N/A;   BUNIONECTOMY Bilateral    FOOT SURGERY Left    repair of "kissing Cousins"   JOINT REPLACEMENT Bilateral    hip    JOINT REPLACEMENT Right 2010 or 2012   knee   KNEE SURGERY     RIGHT/LEFT HEART CATH AND CORONARY ANGIOGRAPHY N/A 10/27/2021   Procedure: RIGHT/LEFT HEART CATH AND CORONARY ANGIOGRAPHY;  Surgeon: Corky Crafts, MD;  Location: [facility] INVASIVE CV LAB;  Service: Cardiovascular;  Laterality: N/A;   SHOULDER ARTHROSCOPY Right    shoulder surgery in [place] about 2000   SHOULDER HEMI-ARTHROPLASTY Left 03/25/2014   Procedure: LEFT SHOULDER HEMI-ARTHROPLASTY;  Surgeon: Vickki Hearing, MD;  Location: [facility];  Service: Orthopedics;  Laterality: Left;   Past Medical History:  Diagnosis Date   Anemia    Anxiety    Arthritis    COPD (chronic obstructive pulmonary disease) (HCC)    CVA (cerebral vascular accident) (HCC)    Depression    Dry eyes 04/14/2014   GERD (gastroesophageal reflux disease)    Glaucoma    Gout    HTN (hypertension)    OSA (obstructive sleep apnea)    There were no  vitals taken for this visit.  Opioid Risk Score:   Fall Risk Score:  `1  Depression screen PHQ 2/9     01/15/2023    1:40 PM 12/28/2022   10:51 AM 12/01/2022    9:27 AM 11/09/2022    3:26 PM 10/19/2022   11:03 AM 09/14/2022    2:13 PM 08/31/2022    3:43 PM  Depression screen PHQ 2/9  Decreased Interest 2 0 0 0 0 0 0  Down, Depressed, Hopeless 2 0 0 0 0 0 0  PHQ -  2 Score 4 0 0 0 0 0 0  Altered sleeping 3  3   0   Tired, decreased energy 2  2   1    Change in appetite 0  3   1   Feeling bad or failure about yourself  0  0   0   Trouble concentrating 1  0   0   Moving slowly or fidgety/restless 0  0   0   Suicidal thoughts 0  0   0   PHQ-9 Score 10  8   2    Difficult doing work/chores Somewhat difficult  Not difficult at all   Not difficult at all       Review of Systems  Musculoskeletal:  Positive for back pain.  All other systems reviewed and are negative.     Objective:   Physical Exam Vitals and nursing note reviewed.  Constitutional:      Appearance: Normal appearance.  Cardiovascular:     Rate and Rhythm: Normal rate and regular rhythm.     Pulses: Normal pulses.     Heart sounds: Normal heart sounds.  Pulmonary:     Effort: Pulmonary effort is normal.     Breath sounds: Normal breath sounds.  Musculoskeletal:     Comments: Normal Muscle Bulk and Muscle Testing Reveals:  Upper Extremities: Full ROM and Muscle Strength 5/5   Lumbar Paraspinal Tenderness: L-4- L-5 Lower Extremities : Full ROM and Muscle Strength 5/5 Arises from Table slowly using cane for support Narrow based Gait     Skin:    General: Skin is warm and dry.  Neurological:     Mental Status: She is alert and oriented to person, place, and time.  Psychiatric:        [name] and Affect: [name] normal.        Behavior: Behavior normal.         Assessment & Plan:  Lumbar Radiculitis: Continue Effexor. Continue to Monitor.  Chronic Pain Syndrome: Continue Hydrocodone 5 mg-325 one tablet 4 times  a day as needed for pain. No prescription ordered today, last fill date  according to PMP is 02/17/2023. We will continue the opioid monitoring program, this consists of regular clinic visits, examinations, urine drug screen, pill counts as well as use of [place] Controlled Substance Reporting system. A 12 month History has been reviewed on the [place] Controlled Substance Reporting System on 02/26/2023,

## 2023-02-28 ENCOUNTER — Other Ambulatory Visit: Payer: Self-pay | Admitting: Internal Medicine

## 2023-03-01 ENCOUNTER — Other Ambulatory Visit: Payer: Self-pay | Admitting: Internal Medicine

## 2023-03-01 DIAGNOSIS — F5101 Primary insomnia: Secondary | ICD-10-CM

## 2023-03-04 LAB — DRUG TOX MONITOR 1 W/CONF, ORAL FLD
Amphetamines: NEGATIVE ng/mL (ref <10)
Barbiturates: NEGATIVE ng/mL (ref <10)
Benzodiazepines: NEGATIVE ng/mL (ref <0.50)
Buprenorphine: NEGATIVE ng/mL (ref <0.10)
Cocaine: NEGATIVE ng/mL (ref <5.0)
Codeine: NEGATIVE ng/mL (ref <2.5)
Dihydrocodeine: NEGATIVE ng/mL (ref <2.5)
Fentanyl: NEGATIVE ng/mL (ref <0.10)
Heroin Metabolite: NEGATIVE ng/mL (ref <1.0)
Hydrocodone: 5.3 ng/mL — ABNORMAL HIGH (ref <2.5)
Hydromorphone: NEGATIVE ng/mL (ref <2.5)
MARIJUANA: NEGATIVE ng/mL (ref <2.5)
MDMA: NEGATIVE ng/mL (ref <10)
Meprobamate: NEGATIVE ng/mL (ref <2.5)
Methadone: NEGATIVE ng/mL (ref <5.0)
Morphine: NEGATIVE ng/mL (ref <2.5)
Nicotine Metabolite: NEGATIVE ng/mL (ref <5.0)
Norhydrocodone: NEGATIVE ng/mL (ref <2.5)
Noroxycodone: NEGATIVE ng/mL (ref <2.5)
Opiates: POSITIVE ng/mL — AB (ref <2.5)
Oxycodone: NEGATIVE ng/mL (ref <2.5)
Oxymorphone: NEGATIVE ng/mL (ref <2.5)
Phencyclidine: NEGATIVE ng/mL (ref <10)
Tapentadol: NEGATIVE ng/mL (ref <5.0)
Tramadol: NEGATIVE ng/mL (ref <5.0)
Zolpidem: NEGATIVE ng/mL (ref <5.0)

## 2023-03-04 LAB — DRUG TOX ALC METAB W/CON, ORAL FLD: Alcohol Metabolite: NEGATIVE ng/mL (ref <25)

## 2023-03-09 ENCOUNTER — Encounter: Payer: Self-pay | Admitting: Internal Medicine

## 2023-03-09 ENCOUNTER — Ambulatory Visit (INDEPENDENT_AMBULATORY_CARE_PROVIDER_SITE_OTHER): Payer: HMO | Admitting: Internal Medicine

## 2023-03-09 VITALS — BP 110/56 | HR 60 | Ht 62.5 in | Wt 139.0 lb

## 2023-03-09 DIAGNOSIS — Z7984 Long term (current) use of oral hypoglycemic drugs: Secondary | ICD-10-CM | POA: Diagnosis not present

## 2023-03-09 DIAGNOSIS — J449 Chronic obstructive pulmonary disease, unspecified: Secondary | ICD-10-CM

## 2023-03-09 DIAGNOSIS — F5101 Primary insomnia: Secondary | ICD-10-CM

## 2023-03-09 DIAGNOSIS — I5022 Chronic systolic (congestive) heart failure: Secondary | ICD-10-CM | POA: Diagnosis not present

## 2023-03-09 DIAGNOSIS — N184 Chronic kidney disease, stage 4 (severe): Secondary | ICD-10-CM

## 2023-03-09 DIAGNOSIS — E1122 Type 2 diabetes mellitus with diabetic chronic kidney disease: Secondary | ICD-10-CM | POA: Diagnosis not present

## 2023-03-09 MED ORDER — TRELEGY ELLIPTA 200-62.5-25 MCG/ACT IN AEPB: 1.0000 | INHALATION_SPRAY | Freq: Every day | RESPIRATORY_TRACT | 3 refills | Status: DC

## 2023-03-09 NOTE — Assessment & Plan Note (Signed)
 A1c 6.5 on labs from March.  She continues to take Farxiga 10 mg daily. -Repeat A1c ordered today

## 2023-03-09 NOTE — Patient Instructions (Signed)
 It was a pleasure to see you today.  Thank you for giving us  the opportunity to be involved in your care.  Below is a brief recap of your visit and next steps.  We will plan to see you again in 3 months.  Summary Nephrology referral placed today Trelegy refilled Repeat labs Follow up in 3 months

## 2023-03-09 NOTE — Progress Notes (Signed)
 Established Patient Office Visit  Subjective   Patient ID: Heather [name], female    DOB: 10/29/1940  Age: 83 y.o. MRN: [id number]  Chief Complaint  Patient presents with   chronic systolic HF    Three month follow up    Heather [name] returns to care today for follow-up.  She was last evaluated by me on 9/27.  No medication changes were made at that time and 80-month follow-up was arranged.  In the interim, she has completed cardiac rehab and has been seen by cardiology for follow-up.  She has also been seen by physiatry in the setting of chronic back pain.  Acute visit at [facility] on 11/11 in the setting of insomnia and Candida vaginitis.  Today she reports feeling fairly well.  Ambien  has helped with management of insomnia.  She does not have any acute concerns to discuss.  Past Medical History:  Diagnosis Date   Anemia    Anxiety    Arthritis    COPD (chronic obstructive pulmonary disease) (HCC)    CVA (cerebral vascular accident) (HCC)    Depression    Dry eyes 04/14/2014   GERD (gastroesophageal reflux disease)    Glaucoma    Gout    HTN (hypertension)    OSA (obstructive sleep apnea)    Past Surgical History:  Procedure Laterality Date   ABDOMINAL HYSTERECTOMY     BACK SURGERY     lumbar-disc   BIV PACEMAKER INSERTION CRT-P N/A 07/12/2022   Procedure: BIV PACEMAKER INSERTION CRT-P;  Surgeon: Fernande Elspeth BROCKS, MD;  Location: [facility] INVASIVE CV LAB;  Service: Cardiovascular;  Laterality: N/A;   BUNIONECTOMY Bilateral    FOOT SURGERY Left    repair of kissing Cousins   JOINT REPLACEMENT Bilateral    hip    JOINT REPLACEMENT Right 2010 or 2012   knee   KNEE SURGERY     RIGHT/LEFT HEART CATH AND CORONARY ANGIOGRAPHY N/A 10/27/2021   Procedure: RIGHT/LEFT HEART CATH AND CORONARY ANGIOGRAPHY;  Surgeon: Dann Candyce RAMAN, MD;  Location: [facility] INVASIVE CV LAB;  Service: Cardiovascular;  Laterality: N/A;   SHOULDER ARTHROSCOPY Right    shoulder surgery in [place]  about 2000   SHOULDER  HEMI-ARTHROPLASTY Left 03/25/2014   Procedure: LEFT SHOULDER HEMI-ARTHROPLASTY;  Surgeon: Taft FORBES Minerva, MD;  Location: [facility];  Service: Orthopedics;  Laterality: Left;   Social History   Tobacco Use   Smoking status: Former    Current packs/day: 0.00    Average packs/day: 1 pack/day for 40.0 years (40.0 ttl pk-yrs)    Types: Cigarettes    Start date: 03/20/1945    Quit date: 03/20/1985    Years since quitting: 37.9   Smokeless tobacco: Never  Vaping Use   Vaping status: Never Used  Substance Use Topics   Alcohol  use: No   Drug use: No   Family History  Problem Relation Age of Onset   High blood pressure Mother    Aneurysm Father    Diabetes Brother    Pancreatitis Brother    Hypertension Niece    Congestive Heart Failure Niece    Allergies  Allergen Reactions   Lovastatin Swelling    Patient states that her tongue swells and she gets a tingling feeling all over. Patient states that her tongue swells and she gets a tingling feeling all over.   Lisinopril Swelling    Tongue swelling   Review of Systems  Constitutional:  Positive for malaise/fatigue. Negative for chills and fever.  HENT:  Negative for sore throat.   Respiratory:  Negative for cough and shortness of breath.   Cardiovascular:  Negative for chest pain, palpitations and leg swelling.  Gastrointestinal:  Negative for abdominal pain, blood in stool, constipation, diarrhea, nausea and vomiting.  Genitourinary:  Negative for dysuria and hematuria.  Musculoskeletal:  Negative for myalgias.  Skin:  Negative for itching and rash.  Neurological:  Negative for dizziness and headaches.  Psychiatric/Behavioral:  Negative for depression and suicidal ideas.      Objective:     BP (!) 110/56 (BP Location: Left Arm, Patient Position: Sitting, Cuff Size: Normal)   Pulse 60   Ht 5' 2.5 (1.588 m)   Wt 139 lb (63 kg)   SpO2 96%   BMI 25.02 kg/m  BP Readings from Last 3 Encounters:  03/09/23 (!) 110/56   02/26/23 105/61  01/15/23 (!) 90/54   Physical Exam Vitals reviewed.  Constitutional:      General: She is not in acute distress.    Appearance: Normal appearance. She is not toxic-appearing.  HENT:     Head: Normocephalic and atraumatic.     Right Ear: External ear normal.     Left Ear: External ear normal.     Nose: Nose normal. No congestion or rhinorrhea.     Mouth/Throat:     Mouth: Mucous membranes are moist.     Pharynx: Oropharynx is clear. No oropharyngeal exudate or posterior oropharyngeal erythema.  Eyes:     General: No scleral icterus.    Extraocular Movements: Extraocular movements intact.     Conjunctiva/sclera: Conjunctivae normal.     Pupils: Pupils are equal, round, and reactive to light.  Cardiovascular:     Rate and Rhythm: Normal rate and regular rhythm.     Pulses: Normal pulses.     Heart sounds: Normal heart sounds. No murmur heard.    No friction rub. No gallop.  Pulmonary:     Effort: Pulmonary effort is normal.     Breath sounds: Normal breath sounds. No wheezing, rhonchi or rales.  Abdominal:     General: Abdomen is flat. Bowel sounds are normal. There is no distension.     Palpations: Abdomen is soft.     Tenderness: There is no abdominal tenderness.  Musculoskeletal:        General: No swelling. Normal range of motion.     Cervical back: Normal range of motion.     Right lower leg: No edema.     Left lower leg: No edema.  Lymphadenopathy:     Cervical: No cervical adenopathy.  Skin:    General: Skin is warm and dry.     Capillary Refill: Capillary refill takes less than 2 seconds.     Coloration: Skin is not jaundiced.  Neurological:     General: No focal deficit present.     Mental Status: She is alert and oriented to person, place, and time.  Psychiatric:        Mood and Affect: Mood normal.        Behavior: Behavior normal.   Last CBC Lab Results  Component Value Date   WBC 6.7 07/05/2022   HGB 11.4 07/05/2022   HCT 35.6  07/05/2022   MCV 94 07/05/2022   MCH 30.0 07/05/2022   RDW 14.9 07/05/2022   PLT 385 07/05/2022   Last metabolic panel Lab Results  Component Value Date   GLUCOSE 103 (H) 02/07/2023   NA 137 02/07/2023   K 4.9 02/07/2023  CL 108 02/07/2023   CO2 21 (L) 02/07/2023   BUN 74 (H) 02/07/2023   CREATININE 2.28 (H) 02/07/2023   GFRNONAA 21 (L) 02/07/2023   CALCIUM  9.5 02/07/2023   PHOS 3.1 03/17/2022   PROT 6.4 (L) 01/08/2023   ALBUMIN 3.6 01/08/2023   LABGLOB 2.7 05/26/2022   AGRATIO 1.4 05/26/2022   BILITOT 0.5 01/08/2023   ALKPHOS 72 01/08/2023   AST 38 01/08/2023   ALT 49 (H) 01/08/2023   ANIONGAP 8 02/07/2023   Last lipids Lab Results  Component Value Date   CHOL 172 08/17/2022   HDL 82 08/17/2022   LDLCALC 81 08/17/2022   TRIG 45 08/17/2022   CHOLHDL 2.1 08/17/2022   Last hemoglobin A1c Lab Results  Component Value Date   HGBA1C 6.5 (H) 05/26/2022   Last thyroid  functions Lab Results  Component Value Date   TSH 2.096 01/08/2023   Last vitamin D  Lab Results  Component Value Date   VD25OH 60.7 05/26/2022   Last vitamin B12 and Folate Lab Results  Component Value Date   VITAMINB12 778 05/26/2022   FOLATE >20.0 05/26/2022     Assessment & Plan:   Problem List Items Addressed This Visit       Chronic systolic HF (heart failure) (HCC) (Chronic)   Euvolemic on exam today.  On appropriate GDMT.  Cardiology follow-up scheduled for Monday (1/6).      COPD GOLD 1    Pulmonary exam unremarkable.  She remains asymptomatic.  Trelegy refilled today.      Type 2 diabetes mellitus with diabetic chronic kidney disease (HCC)   A1c 6.5 on labs from March.  She continues to take Farxiga  10 mg daily. -Repeat A1c ordered today      Chronic kidney disease, stage IV (severe) (HCC)   GFR 20s on labs from earlier this month.  Noted decline in renal function over the last 3 months. Will refer to nephrology for further evaluation and update BMP today.  She is also  closely followed in the advanced HF clinic.  Follow-up is scheduled for Monday, 1/6.      Primary insomnia   Recent acute visit in the setting of insomnia.  Ambien  5 mg nightly as needed was prescribed and has been effective.      Return in about 3 months (around 06/07/2023).   Manus FORBES Fireman, MD

## 2023-03-09 NOTE — Assessment & Plan Note (Addendum)
 GFR 20s on labs from earlier this month.  Noted decline in renal function over the last 3 months. Will refer to nephrology for further evaluation and update BMP today.  She is also closely followed in the advanced HF clinic.  Follow-up is scheduled for Monday, 1/6.

## 2023-03-09 NOTE — Assessment & Plan Note (Signed)
 Euvolemic on exam today.  On appropriate GDMT.  Cardiology follow-up scheduled for Monday (1/6).

## 2023-03-09 NOTE — Assessment & Plan Note (Signed)
 Recent acute visit in the setting of insomnia.  Ambien 5 mg nightly as needed was prescribed and has been effective.

## 2023-03-09 NOTE — Assessment & Plan Note (Signed)
 Pulmonary exam unremarkable.  She remains asymptomatic.  Trelegy refilled today.

## 2023-03-10 LAB — BASIC METABOLIC PANEL
BUN/Creatinine Ratio: 31 — ABNORMAL HIGH (ref 12–28)
BUN: 85 mg/dL (ref 8–27)
CO2: 17 mmol/L — ABNORMAL LOW (ref 20–29)
Calcium: 9.5 mg/dL (ref 8.7–10.3)
Chloride: 106 mmol/L (ref 96–106)
Creatinine, Ser: 2.72 mg/dL — ABNORMAL HIGH (ref 0.57–1.00)
Glucose: 80 mg/dL (ref 70–99)
Potassium: 5.6 mmol/L — ABNORMAL HIGH (ref 3.5–5.2)
Sodium: 138 mmol/L (ref 134–144)
eGFR: 17 mL/min/{1.73_m2} — ABNORMAL LOW (ref >59)

## 2023-03-10 LAB — HEMOGLOBIN A1C
Est. average glucose Bld gHb Est-mCnc: 126 mg/dL
Hgb A1c MFr Bld: 6 % — ABNORMAL HIGH (ref 4.8–5.6)

## 2023-03-11 NOTE — Progress Notes (Addendum)
 ADVANCED HF CLINIC NOTE  Primary Care: Melvenia Manus BRAVO, MD HF Cardiologist: Dr. Rolan  HPI: 83 y.o. [name] w/ h/o CKD IIIb, LBBB, R cerebellar CVA and chronic biventricular heart failure first diagnosed 10/2021. Echo then showed severely reduced LVEF 15-20%, RV mildly reduced, mild-mod MR. Echo also showed dyssynchrony c/w LBBB. Of note, prior echo in 2017 showed normal LVEF w/ septal dyssynchrony and normal RV.   She underwent [facility] 10/2021 which showed mild nonobstructive CAD. 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 GDMT regimen included Farxiga , Imdur , Bisoprolol . Diuretic regimen Lasix  20 mg daily.    Echo 12/23 showed EF still severely reduced < 15%, progression of RV dysfunction (mod reduced) and severely elevated PA pressure w/ estimated RVSP 68 mmHg.     Admitted 1/24 with CHF exacerbation. Started on lasix  gtt, and placed on empiric milrinone  to augment diuresis. GDMT held w/ soft BP. cMRI showed LVEF 16%, mid-apical inferolateral subendocardial LGE, and moderately dilated RV with RVEF 23%. Milrinone  was weaned and GDMT titrated. She was discharged home with HH, weight 150 lbs.  Echo 4/24 showed EF <20% with moderate LV enlargement, septal-lateral dyssynchrony, moderate RV dysfunction, PASP 65 mmHg.   She was referred to EP and underwent Medtronic CRT-P placement 5/24. Echo 9/24, EF 20-25%, mild LV dilation, moderate RV dysfunction with normal size.   Follow up 9/24, BiV % down to 87%. 7 day Zio placed showing mostly NSR with 16.6% PVC burden. Amiodarone  started.  Zio 7 day (10/24) showed mostly NSR with 16.6% PVR burden. Started on Amio. Repeat Zio 7 day (11/24) with mostly NSR with <1% PVC burden.   Today she returns for HF follow up with her boyfriend. Overall feeling well. Reports mild dyspnea with exertion, no SOB at rest. Denies CP or dizziness. Sleeping on two pillows at home. Always has swelling in her  ankles but its not bad. Wears her compression socks daily. Appetite ok. No fever or chills. Taking all medications.   REDs Clip 22%  Medtronic device interrogation: unable to interrogate device in clinic today  Labs (1/24): K 4.0, creatinine 1.33, hgb 12.9 Labs (3/24): K 4.4, creatinine 1.58, LDL 82 Labs (5/24): K 4.6, creatinine 1.39, Digoxin  2.3  Labs (6/24): digoxin  1.1, LDL 81, K 4.2, creatinine 1.6 Labs (10/24): K 5.0, creatinine 1.54 Labs (12/24): K 4.9, creatinine 2.28, BUN 74 Labs (1/25): K 5.6, creatinine 2.72, BUN 85  PMH: 1. Chronic systolic CHF: Nonischemic cardiomyopathy. Chronic LBBB. Medtronic CRT-P device.  - Angioedema with ACEI. - R/LHC (8/23): mild, non-obstructive CAD; RA mean 8, PA 54/18 (mean 34), PCWP 18, CO/CI (Fick) 3.87/2.24  - Echo (12/23): EF < 15%, moderately reduced RV function, severely elevated PA pressure (RVSP 68 mmHg) - Cardiac MRI (1/24): LVEF 16%, RVEF 23%, mid-apical inferolateral LGE in prior infarction pattern (looks like prior MI but does not explain extent of CMP).  - Echo (4/24): EF < 20% with moderate LV enlargement, septal-lateral dyssynchrony, moderate RV dysfunction, PASP 65 mmHg.  - Echo (9/24): EF 20-25%, mild LV dilation, moderate RV dysfunction with normal size.  2. COPD: Prior smoker.  3. CKD stage 3 4. CVA: prior cerebellar CVA.  5. HTN 6. OSA 7. PVC: Zio (10/24) showed 16.6% burden, amiodarone  started  Current Outpatient Medications  Medication Sig Dispense Refill   acetaminophen  (TYLENOL ) 500 MG tablet Take 500 mg by mouth every 6 (six) hours as needed for mild pain or moderate pain.  albuterol  (VENTOLIN  HFA) 108 (90 Base) MCG/ACT inhaler Inhale 2 puffs into the lungs every 6 (six) hours as needed for wheezing or shortness of breath. 18 g 3   allopurinol  (ZYLOPRIM ) 300 MG tablet Take 0.5 tablets (150 mg total) by mouth daily. 45 tablet 0   amiodarone  (PACERONE ) 200 MG tablet Take 1 tablet (200 mg total) by mouth 2 (two)  times daily for 14 days, THEN 1 tablet (200 mg total) daily. (Patient taking differently: Patient takes 1 tablet by mouth daily.) 90 tablet 3   aspirin  (ASPIRIN  CHILDRENS) 81 MG chewable tablet Chew 1 tablet (81 mg total) by mouth daily. 90 tablet 3   atorvastatin  (LIPITOR) 20 MG tablet Take 1 tablet (20 mg total) by mouth daily. 90 tablet 3   bisoprolol  (ZEBETA ) 5 MG tablet Take 1 tablet (5 mg total) by mouth daily. 90 tablet 3   chlorpheniramine (CHLOR-TRIMETON) 4 MG tablet Take 4 mg by mouth daily as needed for allergies.     Cholecalciferol  (DIALYVITE VITAMIN D  5000 PO) Take 5,000 Units by mouth daily.     famotidine  (PEPCID ) 20 MG tablet Take 1 tablet (20 mg total) by mouth at bedtime. One after supper 30 tablet 3   Fluticasone -Umeclidin-Vilant (TRELEGY ELLIPTA ) 200-62.5-25 MCG/ACT AEPB Inhale 1 puff into the lungs daily. 60 each 3   HYDROcodone -acetaminophen  (NORCO/VICODIN) 5-325 MG tablet Take 1 tablet by mouth every 6 (six) hours as needed for moderate pain (pain score 4-6). 120 tablet 0   ketoconazole  (NIZORAL ) 2 % cream Apply 1 Application topically daily. 15 g 0   Multiple Vitamins-Minerals (MULTIVITAMIN WITH MINERALS) tablet Take 1 tablet by mouth daily.     Propylene Glycol (SYSTANE COMPLETE) 0.6 % SOLN Place 1 drop into both eyes 2 (two) times daily.     venlafaxine  XR (EFFEXOR -XR) 75 MG 24 hr capsule Take 1 capsule (75 mg total) by mouth See admin instructions. (Patient taking differently: Take 75 mg by mouth See admin instructions. Daily) 90 capsule 5   zolpidem  (AMBIEN ) 5 MG tablet Take 1 tablet (5 mg total) by mouth at bedtime as needed for sleep. 30 tablet 0   hydrALAZINE  (APRESOLINE ) 25 MG tablet Take 1 tablet (25 mg total) by mouth 3 (three) times daily. 90 tablet 3   isosorbide  mononitrate (IMDUR ) 30 MG 24 hr tablet Take 0.5 tablets (15 mg total) by mouth daily. (Patient not taking: Reported on 03/12/2023) 45 tablet 3   No current facility-administered medications for this  encounter.   Allergies  Allergen Reactions   Lovastatin Swelling    Patient states that her tongue swells and she gets a tingling feeling all over. Patient states that her tongue swells and she gets a tingling feeling all over.   Lisinopril Swelling    Tongue swelling   Social History   Socioeconomic History   Marital status: Divorced    Spouse name: Not on file   Number of children: Not on file   Years of education: Not on file   Highest education level: 9th grade  Occupational History   Not on file  Tobacco Use   Smoking status: Former    Current packs/day: 0.00    Average packs/day: 1 pack/day for 40.0 years (40.0 ttl pk-yrs)    Types: Cigarettes    Start date: 03/20/1945    Quit date: 03/20/1985    Years since quitting: 38.0   Smokeless tobacco: Never  Vaping Use   Vaping status: Never Used  Substance and Sexual Activity   Alcohol   use: No   Drug use: No   Sexual activity: Yes    Birth control/protection: Surgical  Other Topics Concern   Not on file  Social History Narrative   Mrs Asante is a 83 year old divorced female who lives alone. She has support of her son and sister.    Social Drivers of Health   Financial Resource Strain: Medium Risk (11/27/2022)   Overall Financial Resource Strain (CARDIA)    Difficulty of Paying Living Expenses: Somewhat hard  Food Insecurity: No Food Insecurity (11/27/2022)   Hunger Vital Sign    Worried About Running Out of Food in the Last Year: Never true    Ran Out of Food in the Last Year: Never true  Recent Concern: Food Insecurity - Food Insecurity Present (11/21/2022)   Hunger Vital Sign    Worried About Running Out of Food in the Last Year: Never true    Ran Out of Food in the Last Year: Sometimes true  Transportation Needs: Unmet Transportation Needs (11/27/2022)   PRAPARE - Administrator, Civil Service (Medical): Yes    Lack of Transportation (Non-Medical): No  Physical Activity: Insufficiently Active  (11/27/2022)   Exercise Vital Sign    Days of Exercise per Week: 3 days    Minutes of Exercise per Session: 40 min  Stress: Stress Concern Present (11/27/2022)   [employer] of Occupational Health - Occupational Stress Questionnaire    Feeling of Stress : To some extent  Social Connections: Moderately Isolated (11/27/2022)   Social Connection and Isolation Panel [NHANES]    Frequency of Communication with Friends and Family: More than three times a week    Frequency of Social Gatherings with Friends and Family: More than three times a week    Attends Religious Services: More than 4 times per year    Active Member of [employer] or Organizations: No    Attends Banker Meetings: Never    Marital Status: Widowed  Intimate Partner Violence: Not At Risk (11/09/2022)   Humiliation, Afraid, Rape, and Kick questionnaire    Fear of Current or Ex-Partner: No    Emotionally Abused: No    Physically Abused: No    Sexually Abused: No   Family History  Problem Relation Age of Onset   High blood pressure Mother    Aneurysm Father    Diabetes Brother    Pancreatitis Brother    Hypertension Niece    Congestive Heart Failure Niece    Wt Readings from Last 3 Encounters:  03/12/23 63.2 kg (139 lb 6.4 oz)  03/09/23 63 kg (139 lb)  02/26/23 60.1 kg (132 lb 9.6 oz)   BP 110/64   Pulse 60   Wt 63.2 kg (139 lb 6.4 oz)   SpO2 96%   BMI 25.09 kg/m   PHYSICAL EXAM: General: Well appearing. No distress on RA HEENT: neck supple.   Cardiac: JVP not visible. S1 and S2 present. No murmurs or rub. Resp: Lung sounds clear and equal B/L Abdomen: Soft, non-tender, non-distended. + BS. Extremities: Warm and dry. No rash, cyanosis.  Pedal and ankle 1+ edema.  Neuro: Alert and oriented x3. Affect pleasant. Moves all extremities without difficulty.  ASSESSMENT & PLAN: 1. Chronic systolic CHF: Nonischemic cardiomyopathy. Cath in 8/23 with no significant CAD, CI 2.24. ?LBBB cardiomyopathy versus  familial versus prior myocarditis. Echo in 12/23 with EF < 20%, moderately reduced RV function, D-shaped septum, mild-moderate MR. She was admitted 1/24 with volume overload, empirically started  on milrinone  0.25 and Lasix  gtt.  Cardiac MRI showed LV severely dilated with EF 16% and septal-lateral dyssynchrony, RV moderately dilated with EF 23%, mid-apical inferolateral LGE in prior infarction pattern. However, CAD does not explain the extent of her cardiomyopathy based on cath. Echo in 5/23 showed EF <20% with moderate LV enlargement, septal-lateral dyssynchrony, moderate RV dysfunction, PASP 65 mmHg. Underwent CRT-P 5/24.  Echo 9/24 showed that EF is still 20-25%.  She was noted to only be BiV pacing 87% of the time, frequent PVCs noted on ECG. Concern that PVCs are decreasing her BiV pacing percentage and also could cause worsening LV function if >10% of total beats. (See below). NYHA class I-II.  Not volume up on exam, REDs 22% today. Suspect pedal edema 2/2 venous insufficiency. - Continue compression hose. Wearing daily. - Continue bisoprolol  5 mg daily. - Stop Lasix , Potassium, Spiro, Digoxin , Farxiga , and Losartan  in the setting of AKI +/- worsening CKD. Follow closely to slowly reinitiate. - Dig level/BMET/BNP today.  - Increase hydralazine  to 25 mg tid with stopping GDMT. - Plan for future echo when GDMT reinitiated and adequately suppression of PVCs and BiV % increased to look for improvement in EF. - Complete cardiac rehab 2. CAD: mild nonobstructive disease by cath 8/23, 25% mRCA plaque. No chest pain.  - Continue ASA + statin  3. HLD: on statin for CAD and h/o CVA. LDL Goal < 70 - Good lipids 6/24. 4. CKD stage 3: Concern for AKI on CKD vs. Advancing CKD3>4. Cr 2-2.7 over past few months. Referred to [facility]. - Hold GDMT as above - K 5.6 on 1/3. Repeat BMET today. Send with PRN Lokelma  10 mg with instructions to take only if AHF Clinic instructs. 5. H/o right cerebellar CVA: On  ASA 81 and statin.  6. OSA: Encouraged CPAP use. 7. PVCs: Zio 7 day (10/24) showed 16.6 % burden. Repeat Zio on amio NSR with rare PVCs (<0.1%) - Continue amiodarone  200 mg daily. LFTs and TSH okay (11/24), she will need a regular eye exam while taking amiodarone . - Continue bisoprolol    Follow up in 1 wk with PharmD (GDMT re-initiation; ideally 2-3 visits to get back on GDMT) Follow up 2-3 wk with NP/PA  Sharie Amorin, NP 03/12/23

## 2023-03-12 ENCOUNTER — Encounter (HOSPITAL_COMMUNITY): Payer: Self-pay

## 2023-03-12 ENCOUNTER — Ambulatory Visit (HOSPITAL_COMMUNITY)
Admission: RE | Admit: 2023-03-12 | Discharge: 2023-03-12 | Disposition: A | Discharge status: Home / Self Care | Payer: HMO | Source: Ambulatory Visit | Attending: Cardiology | Admitting: Cardiology

## 2023-03-12 VITALS — BP 110/64 | HR 60 | Wt 139.4 lb

## 2023-03-12 DIAGNOSIS — Z8249 Family history of ischemic heart disease and other diseases of the circulatory system: Secondary | ICD-10-CM | POA: Diagnosis not present

## 2023-03-12 DIAGNOSIS — I5082 Biventricular heart failure: Secondary | ICD-10-CM | POA: Insufficient documentation

## 2023-03-12 DIAGNOSIS — G4733 Obstructive sleep apnea (adult) (pediatric): Secondary | ICD-10-CM | POA: Insufficient documentation

## 2023-03-12 DIAGNOSIS — Z8673 Personal history of transient ischemic attack (TIA), and cerebral infarction without residual deficits: Secondary | ICD-10-CM | POA: Diagnosis not present

## 2023-03-12 DIAGNOSIS — I447 Left bundle-branch block, unspecified: Secondary | ICD-10-CM | POA: Diagnosis not present

## 2023-03-12 DIAGNOSIS — I428 Other cardiomyopathies: Secondary | ICD-10-CM | POA: Insufficient documentation

## 2023-03-12 DIAGNOSIS — I130 Hypertensive heart and chronic kidney disease with heart failure and stage 1 through stage 4 chronic kidney disease, or unspecified chronic kidney disease: Secondary | ICD-10-CM | POA: Insufficient documentation

## 2023-03-12 DIAGNOSIS — E785 Hyperlipidemia, unspecified: Secondary | ICD-10-CM | POA: Insufficient documentation

## 2023-03-12 DIAGNOSIS — Z5986 Financial insecurity: Secondary | ICD-10-CM | POA: Insufficient documentation

## 2023-03-12 DIAGNOSIS — I5022 Chronic systolic (congestive) heart failure: Secondary | ICD-10-CM | POA: Diagnosis not present

## 2023-03-12 DIAGNOSIS — N184 Chronic kidney disease, stage 4 (severe): Secondary | ICD-10-CM | POA: Diagnosis not present

## 2023-03-12 DIAGNOSIS — Z79899 Other long term (current) drug therapy: Secondary | ICD-10-CM | POA: Insufficient documentation

## 2023-03-12 DIAGNOSIS — N1832 Chronic kidney disease, stage 3b: Secondary | ICD-10-CM | POA: Diagnosis not present

## 2023-03-12 DIAGNOSIS — I2510 Atherosclerotic heart disease of native coronary artery without angina pectoris: Secondary | ICD-10-CM | POA: Insufficient documentation

## 2023-03-12 DIAGNOSIS — I493 Ventricular premature depolarization: Secondary | ICD-10-CM | POA: Diagnosis not present

## 2023-03-12 DIAGNOSIS — E875 Hyperkalemia: Secondary | ICD-10-CM | POA: Diagnosis not present

## 2023-03-12 DIAGNOSIS — Z5982 Transportation insecurity: Secondary | ICD-10-CM | POA: Diagnosis not present

## 2023-03-12 LAB — BASIC METABOLIC PANEL
Anion gap: 12 (ref 5–15)
BUN: 81 mg/dL — ABNORMAL HIGH (ref 8–23)
CO2: 19 mmol/L — ABNORMAL LOW (ref 22–32)
Calcium: 9.4 mg/dL (ref 8.9–10.3)
Chloride: 108 mmol/L (ref 98–111)
Creatinine, Ser: 2.47 mg/dL — ABNORMAL HIGH (ref 0.44–1.00)
GFR, Estimated: 19 mL/min — ABNORMAL LOW (ref >60)
Glucose, Bld: 79 mg/dL (ref 70–99)
Potassium: 5 mmol/L (ref 3.5–5.1)
Sodium: 139 mmol/L (ref 135–145)

## 2023-03-12 LAB — BRAIN NATRIURETIC PEPTIDE: B Natriuretic Peptide: 1199.1 pg/mL — ABNORMAL HIGH (ref 0.0–100.0)

## 2023-03-12 LAB — DIGOXIN LEVEL: Digoxin Level: 1.5 ng/mL (ref 0.8–2.0)

## 2023-03-12 MED ORDER — HYDRALAZINE HCL 25 MG PO TABS: 25.0000 mg | ORAL_TABLET | Freq: Three times a day (TID) | ORAL | 3 refills | Status: DC

## 2023-03-12 NOTE — Patient Instructions (Addendum)
 Thank you for coming in today  If you had labs drawn today, any labs that are abnormal the clinic will call you No news is good news  [facility] [phone]  Medications:  you were given a Lokelma  pack please do not take until advised by Heart Failure Clinic  STOP Lasix  STOP Potassium STOP Spironolactone  STOP Digoxin  STOP Farxiga   STOP Losartan     Follow up appointments:  Your physician recommends that you schedule a follow-up appointment in:  1 week with Pharmacy 3-4 weeks in clinic   Do the following things EVERYDAY: Weigh yourself in the morning before breakfast. Write it down and keep it in a log. Take your medicines as prescribed Eat low salt foods--Limit salt (sodium) to 2000 mg per day.  Stay as active as you can everyday Limit all fluids for the day to less than 2 liters   At the [facility], you and your health needs are our priority. As part of our continuing mission to provide you with exceptional heart care, we have created designated Provider Care Teams. These Care Teams include your primary Cardiologist (physician) and Advanced Practice Providers (APPs- Physician Assistants and Nurse Practitioners) who all work together to provide you with the care you need, when you need it.   You may see any of the following providers on your designated Care Team at your next follow up: Dr Toribio Fuel Dr Ezra Shuck Dr. Ria Gardenia Greig Lenetta, NP Caffie Shed, [place] [facility] [place], [place] Beckey Coe, NP Tinnie Redman, PharmD   Please be sure to bring in all your medications bottles to every appointment.    Thank you for choosing [facility]  If you have any questions or concerns before your next appointment please send us  a message through [place] or call our office at [phone].    TO LEAVE A MESSAGE FOR THE NURSE SELECT OPTION 2, PLEASE LEAVE A MESSAGE INCLUDING: YOUR  NAME DATE OF BIRTH CALL BACK NUMBER REASON FOR CALL**this is important as we prioritize the call backs  YOU WILL RECEIVE A CALL BACK THE SAME DAY AS LONG AS YOU CALL BEFORE 4:00 PM

## 2023-03-12 NOTE — Addendum Note (Signed)
 Encounter addended by: Aamilah Augenstein, [place], NP on: 03/12/2023 2:16 PM  Actions taken: Clinical Note Signed

## 2023-03-13 DIAGNOSIS — I5022 Chronic systolic (congestive) heart failure: Secondary | ICD-10-CM | POA: Diagnosis not present

## 2023-03-13 DIAGNOSIS — G4733 Obstructive sleep apnea (adult) (pediatric): Secondary | ICD-10-CM | POA: Diagnosis not present

## 2023-03-13 DIAGNOSIS — I129 Hypertensive chronic kidney disease with stage 1 through stage 4 chronic kidney disease, or unspecified chronic kidney disease: Secondary | ICD-10-CM | POA: Diagnosis not present

## 2023-03-13 DIAGNOSIS — I2510 Atherosclerotic heart disease of native coronary artery without angina pectoris: Secondary | ICD-10-CM | POA: Diagnosis not present

## 2023-03-13 NOTE — Progress Notes (Signed)
Advanced Heart Failure Clinic Note   Primary Care: [name], MD HF Cardiologist: Dr. Shirlee Latch  HPI:  83 y.o. [name] w/ h/o CKD IIIb, LBBB, R cerebellar CVA and chronic biventricular heart failure first diagnosed 10/2021. Echo then showed severely reduced LVEF 15-20%, RV mildly reduced, mild-mod MR. Echo also showed dyssynchrony c/w LBBB. Of note, prior echo in 2017 showed normal LVEF w/ septal dyssynchrony and normal RV.   She underwent [facility] 10/2021 which showed mild nonobstructive CAD. 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 GDMT regimen included Farxiga, Imdur, Bisoprolol. Diuretic regimen Lasix 20 mg daily.    Echo 02/2022 showed EF still severely reduced < 15%, progression of RV dysfunction (mod reduced) and severely elevated PA pressure w/ estimated RVSP 68 mmHg.     Admitted 03/2022 with CHF exacerbation. Started on Lasix gtt, and placed on empiric milrinone to augment diuresis. GDMT held w/ soft BP. cMRI showed LVEF 16%, mid-apical inferolateral subendocardial LGE, and moderately dilated RV with RVEF 23%. Milrinone was weaned and GDMT titrated. She was discharged home with HH, weight 150 lbs.   Echo 06/2022 showed EF <20% with moderate LV enlargement, septal-lateral dyssynchrony, moderate RV dysfunction, PASP 65 mmHg.    She was referred to EP and underwent Medtronic CRT-P placement 07/2022. Echo 11/2022, EF 20-25%, mild LV dilation, moderate RV dysfunction with normal size.    Follow up 11/2022, BiV % down to 87%. 7 day Zio placed showing mostly NSR with 16.6% PVC burden. Amiodarone started.   Zio 7 day (12/2022) showed mostly NSR with 16.6% PVR burden. Started on Amiodarone. Repeat Zio 7 day (01/2023) with mostly NSR with <1% PVC burden.    Returned to [facility] for HF follow up with her boyfriend on 03/12/23. Overall was feeling well. Reported mild dyspnea with exertion, no SOB at rest. Denied CP or  dizziness. Had been sleeping on two pillows at home. Noted she always has swelling in her ankles but "its not bad". Wears her compression socks daily. Appetite was ok. No fever or chills. Reported taking all medications. REDs Clip 22%.    Today she returns to HF clinic for pharmacist medication titration. At last visit with APP, Lasix, potassium supplements, spironolactone, digoxin, Farxiga and losartan were discontinued in the setting of AKI and worsening CKD. Hydralazine was increased to 25 mg TID. She was provided with PRN Lokelma. Discussed her medications today. She was able to discontinue all of the above medications appropriately, but has still been taking hydralazine 12.5 mg TID (she did not realize this changed). Overall she is feeling ok today. Her main concern is that she is accumulating too much fluid. She has noticed that she is getting SOB when walking from her apartment to her car. She has to rest and use her rescue inhaler. She was not SOB when doing this before. Also reports increased swelling in her legs. Has 2+ bilateral LEE, swelling up to knees. She is wearing her compression stockings. Weight at home used to be 131-132 lbs, now up to 140 lbs. She is up 10 lbs from last clinic weight on our scale today. No PND/orthopnea. No dizziness, does note some lightheadedness, but states this is the same since last visit.   HF Medications: Bisoprolol 5 mg daily Hydralazine 25 mg TID - taking 12.5 mg TID Imdur 15 mg daily   Has the patient been experiencing any side effects to the medications  prescribed?  no  Does the patient have any problems obtaining medications due to transportation or finances?   Yes; reports when she was taking [place], it was expensive.   Understanding of regimen: good Understanding of indications: good Potential of compliance: good Patient understands to avoid NSAIDs. Patient understands to avoid decongestants.    Pertinent Lab Values: 03/21/22: Serum creatinine  2.08, BUN 42, Potassium 4.3, Sodium 139. BNP drawn today is still pending.   Vital Signs: Weight: 149 lbs (last clinic weight: 139.4 lbs) Blood pressure: 120/62  Heart rate: 60   Assessment/Plan: 1. Chronic systolic CHF: Nonischemic cardiomyopathy. Cath in 10/2021 with no significant CAD, CI 2.24. ?LBBB cardiomyopathy versus familial versus prior myocarditis. Echo in 12/23 with EF < 20%, moderately reduced RV function, D-shaped septum, mild-moderate MR. She was admitted 03/2022 with volume overload, empirically started on milrinone 0.25 and Lasix gtt.  Cardiac MRI showed LV severely dilated with EF 16% and septal-lateral dyssynchrony, RV moderately dilated with EF 23%, mid-apical inferolateral LGE in prior infarction pattern. However, CAD does not explain the extent of her cardiomyopathy based on cath. Echo in 07/2021 showed EF <20% with moderate LV enlargement, septal-lateral dyssynchrony, moderate RV dysfunction, PASP 65 mmHg. Underwent CRT-P 07/2022.  Echo 9/24 showed that EF is still 20-25%.  She was noted to only be BiV pacing 87% of the time, frequent PVCs noted on ECG. Concern that PVCs are decreasing her BiV pacing percentage and also could cause worsening LV function if >10% of total beats. (See below).  - NYHA class II-III.  Volume status elevated. Weight up 10 lbs and increased LEE. Has chronic pedal edema 2/2 venous insufficiency but notes swelling has increased. - Continue compression hose. Wearing daily. - BMET today with improved Scr (2.47>2.08) and K (5.6>5.0>4.3). BNP still pending. Will need to repeat BMET again next week after making the changes below.  - Take Lasix 40 mg daily for 2 days then start Lasix 20 mg daily (was taking 40 mg daily before discontinued previously per her report).  - Continue bisoprolol 5 mg daily. - Start Jardiance 10 mg daily (will not restart Marcelline Deist as it is not preferred on her new insurance). - Continue hydralazine 12.5 mg TID. Plan to increased to 25 mg  TID at follow up if BP remains stable. Continue Imdur 15 mg daily.  - Stay off  Potassium, Spironolactone, Digoxin, and Losartan for now in the setting of AKI +/- worsening CKD. Following closely to slowly reinitiate. - Plan for future echo when GDMT reinitiated and adequately suppression of PVCs and BiV % increased to look for improvement in EF. - Complete cardiac rehab 2. CAD: mild nonobstructive disease by cath 10/2021, 25% mRCA plaque. No chest pain.  - Continue ASA + statin  3. HLD: on statin for CAD and h/o CVA. LDL Goal < 70 - Good lipids 08/2022. 4. CKD stage 3: Concern for AKI on CKD vs. Advancing CKD3>4. Cr 2-2.7 over past few months. Referred to [facility] last visit. - Start Jardiance 10 mg daily as above. BMET in 1 week.  - K 5.6 on 03/09/23, down to 5.0 on 03/12/23. She was given PRN Lokelma for use at home if instructed by AHF.  5. H/o right cerebellar CVA: On ASA 81 and statin.  6. OSA: Encouraged CPAP use. 7. PVCs: Zio 7 day (12/2022) showed 16.6 % burden. Repeat Zio on amio NSR with rare PVCs (<0.1%) - Continue amiodarone 200 mg daily. LFTs and TSH okay (01/2023), she will need a  regular eye exam while taking amiodarone. - Continue bisoprolol  Follow up 1 week with pharmacy clinic.   Karle Plumber, PharmD, BCPS, BCCP, CPP Heart Failure Clinic Pharmacist [phone]

## 2023-03-14 ENCOUNTER — Other Ambulatory Visit (HOSPITAL_COMMUNITY): Payer: Self-pay | Admitting: Nephrology

## 2023-03-14 DIAGNOSIS — N184 Chronic kidney disease, stage 4 (severe): Secondary | ICD-10-CM

## 2023-03-14 DIAGNOSIS — I129 Hypertensive chronic kidney disease with stage 1 through stage 4 chronic kidney disease, or unspecified chronic kidney disease: Secondary | ICD-10-CM

## 2023-03-14 DIAGNOSIS — N289 Disorder of kidney and ureter, unspecified: Secondary | ICD-10-CM

## 2023-03-14 DIAGNOSIS — E1122 Type 2 diabetes mellitus with diabetic chronic kidney disease: Secondary | ICD-10-CM

## 2023-03-20 DIAGNOSIS — N189 Chronic kidney disease, unspecified: Secondary | ICD-10-CM | POA: Diagnosis not present

## 2023-03-22 ENCOUNTER — Other Ambulatory Visit (HOSPITAL_COMMUNITY): Payer: Self-pay

## 2023-03-22 ENCOUNTER — Ambulatory Visit (HOSPITAL_COMMUNITY)
Admission: RE | Admit: 2023-03-22 | Discharge: 2023-03-22 | Disposition: A | Discharge status: Home / Self Care | Payer: HMO | Source: Ambulatory Visit | Attending: Cardiology | Admitting: Cardiology

## 2023-03-22 VITALS — BP 120/62 | HR 60 | Wt 149.0 lb

## 2023-03-22 DIAGNOSIS — I428 Other cardiomyopathies: Secondary | ICD-10-CM | POA: Insufficient documentation

## 2023-03-22 DIAGNOSIS — N1832 Chronic kidney disease, stage 3b: Secondary | ICD-10-CM | POA: Insufficient documentation

## 2023-03-22 DIAGNOSIS — I2510 Atherosclerotic heart disease of native coronary artery without angina pectoris: Secondary | ICD-10-CM | POA: Diagnosis not present

## 2023-03-22 DIAGNOSIS — I130 Hypertensive heart and chronic kidney disease with heart failure and stage 1 through stage 4 chronic kidney disease, or unspecified chronic kidney disease: Secondary | ICD-10-CM | POA: Diagnosis not present

## 2023-03-22 DIAGNOSIS — I447 Left bundle-branch block, unspecified: Secondary | ICD-10-CM | POA: Diagnosis not present

## 2023-03-22 DIAGNOSIS — E785 Hyperlipidemia, unspecified: Secondary | ICD-10-CM | POA: Diagnosis not present

## 2023-03-22 DIAGNOSIS — I5022 Chronic systolic (congestive) heart failure: Secondary | ICD-10-CM | POA: Insufficient documentation

## 2023-03-22 LAB — BASIC METABOLIC PANEL
Anion gap: 7 (ref 5–15)
BUN: 42 mg/dL — ABNORMAL HIGH (ref 8–23)
CO2: 20 mmol/L — ABNORMAL LOW (ref 22–32)
Calcium: 8.9 mg/dL (ref 8.9–10.3)
Chloride: 112 mmol/L — ABNORMAL HIGH (ref 98–111)
Creatinine, Ser: 2.08 mg/dL — ABNORMAL HIGH (ref 0.44–1.00)
GFR, Estimated: 24 mL/min — ABNORMAL LOW (ref >60)
Glucose, Bld: 82 mg/dL (ref 70–99)
Potassium: 4.3 mmol/L (ref 3.5–5.1)
Sodium: 139 mmol/L (ref 135–145)

## 2023-03-22 MED ORDER — FUROSEMIDE 40 MG PO TABS: 20.0000 mg | ORAL_TABLET | Freq: Every day | ORAL | 3 refills | Status: DC

## 2023-03-22 MED ORDER — EMPAGLIFLOZIN 10 MG PO TABS: 10.0000 mg | ORAL_TABLET | Freq: Every day | ORAL | 3 refills | Status: DC

## 2023-03-22 NOTE — Patient Instructions (Signed)
It was a pleasure seeing you today!  MEDICATIONS: -We are changing your medications today -Start Jardiance 10 mg (1 tablet) daily. Your new insurance prefers this over [place]. -Take Lasix (furosemide) 40 mg (1 tablet) daily for 2 days, then start Lasix 20 mg (1/2 tablet) daily.  -Call if you have questions about your medications.  LABS: -We will call you if your labs need attention.  NEXT APPOINTMENT: Return to clinic in 1 week with Pharmacy Clinic.  In general, to take care of your heart failure: -Limit your fluid intake to 2 Liters (half-gallon) per day.   -Limit your salt intake to ideally 2-3 grams (2000-3000 mg) per day. -Weigh yourself daily and record, and bring that "weight diary" to your next appointment.  (Weight gain of 2-3 pounds in 1 day typically means fluid weight.) -The medications for your heart are to help your heart and help you live longer.   -Please contact us before stopping any of your heart medications.  Call the clinic at [phone] with questions or to reschedule future appointments.

## 2023-03-23 ENCOUNTER — Other Ambulatory Visit: Payer: Self-pay | Admitting: Internal Medicine

## 2023-03-23 ENCOUNTER — Ambulatory Visit (HOSPITAL_COMMUNITY)
Admission: RE | Admit: 2023-03-23 | Discharge: 2023-03-23 | Disposition: A | Discharge status: Home / Self Care | Payer: HMO | Source: Ambulatory Visit | Attending: Nephrology | Admitting: Nephrology

## 2023-03-23 DIAGNOSIS — I129 Hypertensive chronic kidney disease with stage 1 through stage 4 chronic kidney disease, or unspecified chronic kidney disease: Secondary | ICD-10-CM | POA: Insufficient documentation

## 2023-03-23 DIAGNOSIS — M1A9XX0 Chronic gout, unspecified, without tophus (tophi): Secondary | ICD-10-CM

## 2023-03-23 DIAGNOSIS — N184 Chronic kidney disease, stage 4 (severe): Secondary | ICD-10-CM | POA: Diagnosis not present

## 2023-03-23 DIAGNOSIS — N289 Disorder of kidney and ureter, unspecified: Secondary | ICD-10-CM | POA: Insufficient documentation

## 2023-03-23 DIAGNOSIS — E1122 Type 2 diabetes mellitus with diabetic chronic kidney disease: Secondary | ICD-10-CM | POA: Insufficient documentation

## 2023-03-27 NOTE — Progress Notes (Signed)
Advanced Heart Failure Clinic Note   Primary Care: [name], MD HF Cardiologist: Dr. Shirlee Latch  HPI:  83 y.o. [name] w/ h/o CKD IIIb, LBBB, R cerebellar CVA and chronic biventricular heart failure first diagnosed 10/2021. Echo then showed severely reduced LVEF 15-20%, RV mildly reduced, mild-mod MR. Echo also showed dyssynchrony c/w LBBB. Of note, prior echo in 2017 showed normal LVEF w/ septal dyssynchrony and normal RV.   She underwent [facility] 10/2021 which showed mild nonobstructive CAD. 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 GDMT regimen included Farxiga, Imdur, Bisoprolol. Diuretic regimen Lasix 20 mg daily.    Echo 02/2022 showed EF still severely reduced < 15%, progression of RV dysfunction (mod reduced) and severely elevated PA pressure w/ estimated RVSP 68 mmHg.     Admitted 03/2022 with CHF exacerbation. Started on Lasix gtt, and placed on empiric milrinone to augment diuresis. GDMT held w/ soft BP. cMRI showed LVEF 16%, mid-apical inferolateral subendocardial LGE, and moderately dilated RV with RVEF 23%. Milrinone was weaned and GDMT titrated. She was discharged home with HH, weight 150 lbs.   Echo 06/2022 showed EF <20% with moderate LV enlargement, septal-lateral dyssynchrony, moderate RV dysfunction, PASP 65 mmHg.    She was referred to EP and underwent Medtronic CRT-P placement 07/2022. Echo 11/2022, EF 20-25%, mild LV dilation, moderate RV dysfunction with normal size.    Follow up 11/2022, BiV % down to 87%. 7 day Zio placed showing mostly NSR with 16.6% PVC burden. Amiodarone started.   Zio 7 day (12/2022) showed mostly NSR with 16.6% PVR burden. Started on Amiodarone. Repeat Zio 7 day (01/2023) with mostly NSR with <1% PVC burden.    Returned to [facility] for HF follow up with her boyfriend on 03/12/23. Overall was feeling well. Reported mild dyspnea with exertion, no SOB at rest. Denied CP or  dizziness. Had been sleeping on two pillows at home. Noted she always has swelling in her ankles but "its not bad". Wears her compression socks daily. Appetite was ok. No fever or chills. Reported taking all medications. REDs Clip 22%.    Presented to pharmacy clinic 03/22/23 for follow up after Lasix, potassium supplements, spironolactone, digoxin, Farxiga and losartan had been discontinued in the setting of AKI and worsening CKD the previous visit.  Overall she was feeling ok. Her main concern was that she felt like she was accumulating too much fluid. She had noticed that she was getting SOB when walking from her apartment to her car. She had to rest and use her rescue inhaler. She was not SOB when doing this before. Also reported increased swelling in her legs. Had 2+ bilateral LEE, swelling up to knees. She was wearing her compression stockings. Weight at home used to be 131-132 lbs, had been up to 140 lbs lately. She was up 10 lbs from previous clinic weight. No PND/orthopnea. No dizziness, did note some lightheadedness, but stated this was the same since her previous visit.  Today she returns to HF clinic for pharmacist medication titration for one week follow up after restarting Jardiance and Lasix.  Unfortunately, she has not noticed any difference in her fluid since starting Jardiance and restarting Lasix. She was able to make all the changes appropriately. She feels like her SOB has become worse since last week. Still wearing TED hose for compression stockings. Still has 2+ bilateral LEE up to her knees and now feels  like her hands are swollen. No PND or orthopnea. Weight in clinic is still 149 lbs and weight at home has remained at 145 lbs. Did not feel like fluid status improved at all after restarting the Lasix, even when taking the full tablet. ReDS 29%.    HF Medications: Bisoprolol 5 mg daily Jardiance 10 mg daily Hydralazine 25 mg TID - taking 12.5 mg TID Imdur 15 mg daily  Lasix 20 mg  daily  Has the patient been experiencing any side effects to the medications prescribed?  no  Does the patient have any problems obtaining medications due to transportation or finances?   [employer]  Understanding of regimen: good Understanding of indications: good Potential of compliance: good Patient understands to avoid NSAIDs. Patient understands to avoid decongestants.    Pertinent Lab Values: 03/21/22: Serum creatinine 2.08, BUN 42, Potassium 4.3, Sodium 139.  Serum creatinine 2.01, BUN 42, Potassium 4.5, Sodium 144, BNP > 4500 (up from 1,199.1 03/12/23).   Vital Signs:  Weight: 149.6 lbs (last clinic weight: 149 lbs) Blood pressure: 126/62  Heart rate: 60   Assessment/Plan: 1. Chronic systolic CHF: Nonischemic cardiomyopathy. Cath in 10/2021 with no significant CAD, CI 2.24. ?LBBB cardiomyopathy versus familial versus prior myocarditis. Echo in 12/23 with EF < 20%, moderately reduced RV function, D-shaped septum, mild-moderate MR. She was admitted 03/2022 with volume overload, empirically started on milrinone 0.25 and Lasix gtt.  Cardiac MRI showed LV severely dilated with EF 16% and septal-lateral dyssynchrony, RV moderately dilated with EF 23%, mid-apical inferolateral LGE in prior infarction pattern. However, CAD does not explain the extent of her cardiomyopathy based on cath. Echo in 07/2021 showed EF <20% with moderate LV enlargement, septal-lateral dyssynchrony, moderate RV dysfunction, PASP 65 mmHg. Underwent CRT-P 07/2022.  Echo 9/24 showed that EF is still 20-25%.  She was noted to only be BiV pacing 87% of the time, frequent PVCs noted on ECG. Concern that PVCs are decreasing her BiV pacing percentage and also could cause worsening LV function if >10% of total beats. (See below).  - NYHA class III.  Volume status elevated. Weight still up 10 lbs from her dry weight and has not improved after restarting Lasix and Jardiance. Has 2+ bilateral LEE up to her knees. Has  chronic pedal edema 2/2 venous insufficiency but notes swelling has increased from her baseline. Also increased SOB. ReDS is 29% today (was 22% two weeks ago). BNP today >4500 compared to 1,199 two weeks ago. ReDS does not appear to be accurately reflective of her fluid status.  -Saw patient with Brynda Peon, NP. Plan is to increase Lasix to 40 mg daily and take a dose of metolazone 2.5 mg today. She will take 20 mEq of KCL with metolazone. Repeat BMET/BNP at follow up in 12 days. She knows to call the clinic if fluid becomes worse or becomes more symptomatic. Was provided with prescription for compression stockings to place her TED hose.  - Continue bisoprolol 5 mg daily. - Continue Jardiance 10 mg daily Marcelline Deist is not preferred on her new insurance). - Continue hydralazine 12.5 mg TID. Plan to increased to 25 mg TID at follow up if BP remains stable. Continue Imdur 15 mg daily.  - Stay off  Potassium, Spironolactone, Digoxin, and Losartan for now in the setting of AKI +/- worsening CKD. Following closely to slowly reinitiate. - Plan for future echo when GDMT reinitiated and adequately suppression of PVCs and BiV % increased to look for improvement in EF. -  Complete cardiac rehab 2. CAD: mild nonobstructive disease by cath 10/2021, 25% mRCA plaque. No chest pain.  - Continue ASA + statin  3. HLD: on statin for CAD and h/o CVA. LDL Goal < 70 - Good lipids 08/2022. 4. CKD stage 3: Concern for AKI on CKD vs. Advancing CKD3>4. Cr 2-2.7 over past few months. Referred to [facility] last visit. - Continue Jardiance 10 mg daily - K 5.6 on 03/09/23, down to 5.0 on 03/12/23. She was given PRN Lokelma for use at home if instructed by AHF.  - Scr 2.01 on BMET today 5. H/o right cerebellar CVA: On ASA 81 and statin.  6. OSA: Encouraged CPAP use. 7. PVCs: Zio 7 day (12/2022) showed 16.6 % burden. Repeat Zio on amio NSR with rare PVCs (<0.1%) - Continue amiodarone 200 mg daily. LFTs and TSH okay (01/2023), she  will need a regular eye exam while taking amiodarone. - Continue bisoprolol  Follow up 12 days with [facility].    Karle Plumber, PharmD, BCPS, BCCP, CPP Heart Failure Clinic Pharmacist [phone]

## 2023-03-29 ENCOUNTER — Ambulatory Visit (HOSPITAL_COMMUNITY)
Admission: RE | Admit: 2023-03-29 | Discharge: 2023-03-29 | Disposition: A | Discharge status: Home / Self Care | Payer: HMO | Source: Ambulatory Visit | Attending: Cardiology | Admitting: Cardiology

## 2023-03-29 VITALS — BP 126/62 | HR 60 | Wt 149.6 lb

## 2023-03-29 DIAGNOSIS — Z79899 Other long term (current) drug therapy: Secondary | ICD-10-CM | POA: Diagnosis not present

## 2023-03-29 DIAGNOSIS — I428 Other cardiomyopathies: Secondary | ICD-10-CM | POA: Insufficient documentation

## 2023-03-29 DIAGNOSIS — Z7982 Long term (current) use of aspirin: Secondary | ICD-10-CM | POA: Insufficient documentation

## 2023-03-29 DIAGNOSIS — I447 Left bundle-branch block, unspecified: Secondary | ICD-10-CM | POA: Insufficient documentation

## 2023-03-29 DIAGNOSIS — I2510 Atherosclerotic heart disease of native coronary artery without angina pectoris: Secondary | ICD-10-CM | POA: Insufficient documentation

## 2023-03-29 DIAGNOSIS — I5082 Biventricular heart failure: Secondary | ICD-10-CM | POA: Insufficient documentation

## 2023-03-29 DIAGNOSIS — G4733 Obstructive sleep apnea (adult) (pediatric): Secondary | ICD-10-CM | POA: Insufficient documentation

## 2023-03-29 DIAGNOSIS — I5022 Chronic systolic (congestive) heart failure: Secondary | ICD-10-CM | POA: Diagnosis not present

## 2023-03-29 DIAGNOSIS — N1832 Chronic kidney disease, stage 3b: Secondary | ICD-10-CM | POA: Insufficient documentation

## 2023-03-29 DIAGNOSIS — I493 Ventricular premature depolarization: Secondary | ICD-10-CM | POA: Diagnosis not present

## 2023-03-29 DIAGNOSIS — E785 Hyperlipidemia, unspecified: Secondary | ICD-10-CM | POA: Insufficient documentation

## 2023-03-29 DIAGNOSIS — Z8673 Personal history of transient ischemic attack (TIA), and cerebral infarction without residual deficits: Secondary | ICD-10-CM | POA: Diagnosis not present

## 2023-03-29 LAB — BASIC METABOLIC PANEL
Anion gap: 12 (ref 5–15)
BUN: 42 mg/dL — ABNORMAL HIGH (ref 8–23)
CO2: 22 mmol/L (ref 22–32)
Calcium: 9.1 mg/dL (ref 8.9–10.3)
Chloride: 110 mmol/L (ref 98–111)
Creatinine, Ser: 2.01 mg/dL — ABNORMAL HIGH (ref 0.44–1.00)
GFR, Estimated: 24 mL/min — ABNORMAL LOW (ref >60)
Glucose, Bld: 105 mg/dL — ABNORMAL HIGH (ref 70–99)
Potassium: 4.5 mmol/L (ref 3.5–5.1)
Sodium: 144 mmol/L (ref 135–145)

## 2023-03-29 LAB — BRAIN NATRIURETIC PEPTIDE: B Natriuretic Peptide: >4500 pg/mL — ABNORMAL HIGH (ref 0.0–100.0)

## 2023-03-29 MED ORDER — METOLAZONE 2.5 MG PO TABS: ORAL_TABLET | ORAL | 0 refills | Status: DC

## 2023-03-29 MED ORDER — FUROSEMIDE 40 MG PO TABS: 40.0000 mg | ORAL_TABLET | Freq: Every day | ORAL | 3 refills | Status: DC

## 2023-03-29 NOTE — Progress Notes (Signed)
ReDS Vest / Clip - 03/29/23 1100       ReDS Vest / Clip   Station Marker A    Ruler Value 28.5    ReDS Value Range Low volume    ReDS Actual Value 29

## 2023-03-29 NOTE — Patient Instructions (Signed)
It was a pleasure seeing you today!  MEDICATIONS: -We are changing your medications today -Increase Lasix (furosemide) to 40 mg (1 tablet) daily -Take one dose of metolazone 2.5 mg today. Please take this 30 minutes before your Lasix dose. -Take one dose of potassium with the metolazone today -We provided a prescription for compression hose today. Continue using the TED hose until you receive the compression stockings.  -Call if you have questions about your medications.  LABS: -We will call you if your labs need attention.  NEXT APPOINTMENT: Return to clinic in 12 days with [facility].  In general, to take care of your heart failure: -Limit your fluid intake to 2 Liters (half-gallon) per day.   -Limit your salt intake to ideally 2-3 grams (2000-3000 mg) per day. -Weigh yourself daily and record, and bring that "weight diary" to your next appointment.  (Weight gain of 2-3 pounds in 1 day typically means fluid weight.) -The medications for your heart are to help your heart and help you live longer.   -Please contact us before stopping any of your heart medications.  Call the clinic at [phone] with questions or to reschedule future appointments.

## 2023-04-05 ENCOUNTER — Other Ambulatory Visit: Payer: Self-pay | Admitting: *Deleted

## 2023-04-06 NOTE — Progress Notes (Incomplete)
ADVANCED HF CLINIC NOTE  Primary Care: Heather Lade, MD HF Cardiologist: Dr. Shirlee Latch  HPI: 83 y.o. [name] w/ h/o CKD IIIb, LBBB, R cerebellar CVA and chronic biventricular heart failure first diagnosed 10/2021. Echo then showed severely reduced LVEF 15-20%, RV mildly reduced, mild-mod MR. Echo also showed dyssynchrony c/w LBBB. Of note, prior echo in 2017 showed normal LVEF w/ septal dyssynchrony and normal RV.   She underwent [facility] 10/2021 which showed mild nonobstructive CAD. 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 GDMT regimen included Farxiga, Imdur, Bisoprolol. Diuretic regimen Lasix 20 mg daily.    Echo 12/23 showed EF still severely reduced < 15%, progression of RV dysfunction (mod reduced) and severely elevated PA pressure w/ estimated RVSP 68 mmHg.     Admitted 1/24 with CHF exacerbation. Started on lasix gtt, and placed on empiric milrinone to augment diuresis. GDMT held w/ soft BP. cMRI showed LVEF 16%, mid-apical inferolateral subendocardial LGE, and moderately dilated RV with RVEF 23%. Milrinone was weaned and GDMT titrated. She was discharged home with HH, weight 150 lbs.  Echo 4/24 showed EF <20% with moderate LV enlargement, septal-lateral dyssynchrony, moderate RV dysfunction, PASP 65 mmHg.   She was referred to EP and underwent [employer] CRT-P placement 5/24. Echo 9/24, EF 20-25%, mild LV dilation, moderate RV dysfunction with normal size.   Follow up 9/24, BiV % down to 87%. 7 day Zio placed showing mostly NSR with 16.6% PVC burden. Amiodarone started.  Zio 7 day (10/24) showed mostly NSR with 16.6% PVR burden. Started on Amio. Repeat Zio 7 day (11/24) with mostly NSR with <1% PVC burden.   Follow up 1/25, concern for hypovolemia and AKI. GMDT held. ReDs low at 22%.   Today she returns for HF follow up. Overall feeling fine. She has SOB walking further distances on flat ground, but says breathing is  improving. Feels occasional palpitations. Continue with LEE, wearing compression hose. Denies CP, dizziness, edema, or PND/Orthopnea. Appetite ok. No fever or chills. Weight at home 140 pounds. Taking all medications. She has CPAP.  ECG (personally reviewed): AV dual-paced rhythm  St Jude device interrogation (personally reviewed): >99% BiV, CorVue shows volume recently up but trending back to baseline now  ReDs reading: 24%, normal  Labs (6/24): digoxin 1.1, LDL 81, K 4.2, creatinine 1.6 Labs (10/24): K 5.0, creatinine 1.54 Labs (12/24): K 4.9, creatinine 2.28  Labs (1/25): K 4.5, creatinine 2.01  PMH: 1. Chronic systolic CHF: Nonischemic cardiomyopathy. Chronic LBBB. Medtronic CRT-P device.  - Angioedema with ACEI. - R/LHC (8/23): mild, non-obstructive CAD; RA mean 8, PA 54/18 (mean 34), PCWP 18, CO/CI (Fick) 3.87/2.24  - Echo (12/23): EF < 15%, moderately reduced RV function, severely elevated PA pressure (RVSP 68 mmHg) - Cardiac MRI (1/24): LVEF 16%, RVEF 23%, mid-apical inferolateral LGE in prior infarction pattern (looks like prior MI but does not explain extent of CMP).  - Echo (4/24): EF < 20% with moderate LV enlargement, septal-lateral dyssynchrony, moderate RV dysfunction, PASP 65 mmHg.  - Echo (9/24): EF 20-25%, mild LV dilation, moderate RV dysfunction with normal size.  2. COPD: Prior smoker.  3. CKD stage 3 4. CVA: prior cerebellar CVA.  5. HTN 6. OSA 7. PVC: Zio (10/24) showed 16.6% burden, amiodarone started  Current Outpatient Medications  Medication Sig Dispense Refill   acetaminophen (TYLENOL) 500 MG tablet Take 500 mg by mouth every 6 (six) hours as needed for  mild pain or moderate pain.     albuterol ([name]) 108 (90 Base) MCG/ACT inhaler Inhale 2 puffs into the lungs every 6 (six) hours as needed for wheezing or shortness of breath. 18 g 3   allopurinol (ZYLOPRIM) 300 MG tablet TAKE ONE-HALF TABLET BY MOUTH EVERY DAY 45 tablet 0   amiodarone (PACERONE)  200 MG tablet Take 1 tablet (200 mg total) by mouth 2 (two) times daily for 14 days, THEN 1 tablet (200 mg total) daily. (Patient taking differently: Patient takes 1 tablet by mouth daily.) 90 tablet 3   aspirin (ASPIRIN CHILDRENS) 81 MG chewable tablet Chew 1 tablet (81 mg total) by mouth daily. 90 tablet 3   atorvastatin (LIPITOR) 20 MG tablet Take 1 tablet (20 mg total) by mouth daily. 90 tablet 3   bisoprolol (ZEBETA) 5 MG tablet Take 1 tablet (5 mg total) by mouth daily. 90 tablet 3   chlorpheniramine (CHLOR-TRIMETON) 4 MG tablet Take 4 mg by mouth daily as needed for allergies.     Cholecalciferol (DIALYVITE VITAMIN D 5000 PO) Take 5,000 Units by mouth daily.     empagliflozin (JARDIANCE) 10 MG TABS tablet Take 1 tablet (10 mg total) by mouth daily before breakfast. 90 tablet 3   famotidine (PEPCID) 20 MG tablet Take 1 tablet (20 mg total) by mouth at bedtime. One after supper 30 tablet 3   Fluticasone-Umeclidin-Vilant (TRELEGY ELLIPTA) 200-62.5-25 MCG/ACT AEPB Inhale 1 puff into the lungs daily. 60 each 3   furosemide (LASIX) 40 MG tablet Take 1 tablet (40 mg total) by mouth daily. 90 tablet 3   hydrALAZINE (APRESOLINE) 25 MG tablet Take 1 tablet (25 mg total) by mouth 3 (three) times daily. 90 tablet 3   HYDROcodone-acetaminophen (NORCO/VICODIN) 5-325 MG tablet Take 1 tablet by mouth every 6 (six) hours as needed for moderate pain (pain score 4-6). 120 tablet 0   isosorbide mononitrate (IMDUR) 30 MG 24 hr tablet Take 0.5 tablets (15 mg total) by mouth daily. 45 tablet 3   ketoconazole (NIZORAL) 2 % cream Apply 1 Application topically daily. 15 g 0   metolazone (ZAROXOLYN) 2.5 MG tablet Take one tablet of metolazone by mouth today before your Lasix dose, then stop. 1 tablet 0   Multiple Vitamins-Minerals (MULTIVITAMIN WITH MINERALS) tablet Take 1 tablet by mouth daily.     Propylene Glycol (SYSTANE COMPLETE) 0.6 % SOLN Place 1 drop into both eyes 2 (two) times daily.     venlafaxine XR  (EFFEXOR-XR) 75 MG 24 hr capsule Take 1 capsule (75 mg total) by mouth See admin instructions. (Patient taking differently: Take 75 mg by mouth See admin instructions. Daily) 90 capsule 5   zolpidem (AMBIEN) 5 MG tablet Take 1 tablet (5 mg total) by mouth at bedtime as needed for sleep. 30 tablet 0   No current facility-administered medications for this encounter.   Allergies  Allergen Reactions   Lovastatin Swelling    Patient states that her tongue swells and she gets a tingling feeling all over. Patient states that her tongue swells and she gets a tingling feeling all over.   Lisinopril Swelling    Tongue swelling   Social History   Socioeconomic History   Marital status: Divorced    Spouse name: Not on file   Number of children: Not on file   Years of education: Not on file   Highest education level: 9th grade  Occupational History   Not on file  Tobacco Use   Smoking status:  Former    Current packs/day: 0.00    Average packs/day: 1 pack/day for 40.0 years (40.0 ttl pk-yrs)    Types: Cigarettes    Start date: 03/20/1945    Quit date: 03/20/1985    Years since quitting: 38.0   Smokeless tobacco: Never  Vaping Use   Vaping status: Never Used  Substance and Sexual Activity   Alcohol use: No   Drug use: No   Sexual activity: Yes    Birth control/protection: Surgical  Other Topics Concern   Not on file  Social History Narrative   Heather [name] is a 83 year old divorced female who lives alone. She has support of her son and sister.    Social Drivers of Health   Financial Resource Strain: Medium Risk (11/27/2022)   Overall Financial Resource Strain (CARDIA)    Difficulty of Paying Living Expenses: Somewhat hard  Food Insecurity: No Food Insecurity (11/27/2022)   Hunger Vital Sign    Worried About Running Out of Food in the Last Year: Never true    Ran Out of Food in the Last Year: Never true  Recent Concern: Food Insecurity - Food Insecurity Present (11/21/2022)   Hunger  Vital Sign    Worried About Running Out of Food in the Last Year: Never true    Ran Out of Food in the Last Year: Sometimes true  Transportation Needs: Unmet Transportation Needs (11/27/2022)   PRAPARE - Administrator, Civil Service (Medical): Yes    Lack of Transportation (Non-Medical): No  Physical Activity: Insufficiently Active (11/27/2022)   Exercise Vital Sign    Days of Exercise per Week: 3 days    Minutes of Exercise per Session: 40 min  Stress: Stress Concern Present (11/27/2022)   [employer] of Occupational Health - Occupational Stress Questionnaire    Feeling of Stress : To some extent  Social Connections: Moderately Isolated (11/27/2022)   Social Connection and Isolation Panel [NHANES]    Frequency of Communication with Friends and Family: More than three times a week    Frequency of Social Gatherings with Friends and Family: More than three times a week    Attends Religious Services: More than 4 times per year    Active Member of [employer] or Organizations: No    Attends Banker Meetings: Never    Marital Status: Widowed  Intimate Partner Violence: Not At Risk (11/09/2022)   Humiliation, Afraid, Rape, and Kick questionnaire    Fear of Current or Ex-Partner: No    Emotionally Abused: No    Physically Abused: No    Sexually Abused: No   Family History  Problem Relation Age of Onset   High blood pressure Mother    Aneurysm Father    Diabetes Brother    Pancreatitis Brother    Hypertension Niece    Congestive Heart Failure Niece    Wt Readings from Last 3 Encounters:  04/09/23 66 kg (145 lb 6.4 oz)  03/29/23 67.9 kg (149 lb 9.6 oz)  03/22/23 67.6 kg (149 lb)   BP 110/62   Pulse 60   Wt 66 kg (145 lb 6.4 oz)   SpO2 90%   BMI 26.17 kg/m   PHYSICAL EXAM: General:  NAD. No resp difficulty, walks into a clinic with a cane HEENT: Normal Neck: Supple. No JVD. Cor: Regular rate & rhythm. No rubs, gallops or murmurs. Lungs: Clear Abdomen:  Soft, nontender, nondistended.  Extremities: No cyanosis, clubbing, rash, edema Neuro: Alert & oriented x  3, moves all 4 extremities w/o difficulty. Affect pleasant.  ASSESSMENT & PLAN: 1. Chronic systolic CHF: Nonischemic cardiomyopathy. Cath in 8/23 with no significant CAD, CI 2.24. ?LBBB cardiomyopathy versus familial versus prior myocarditis. Echo in 12/23 with EF < 20%, moderately reduced RV function, D-shaped septum, mild-moderate MR. She was admitted 1/24 with volume overload, empirically started on milrinone 0.25 and Lasix gtt.  Cardiac MRI showed LV severely dilated with EF 16% and septal-lateral dyssynchrony, RV moderately dilated with EF 23%, mid-apical inferolateral LGE in prior infarction pattern. However, CAD does not explain the extent of her cardiomyopathy based on cath. Echo in 5/23 showed EF <20% with moderate LV enlargement, septal-lateral dyssynchrony, moderate RV dysfunction, PASP 65 mmHg. Underwent CRT-P 5/24.  Echo 9/24 showed that EF is still 20-25%. She was noted to only be BiV pacing 87% of the time, frequent PVCs noted on ECG. Concern that PVCs were decreasing her BiV % and worsen LV function (See below). On device interrogation today, she is BiV pacing >99%. NYHA II, volume looks OK on exam, ReDs normal at 24%. Suspect pedal edema 2/2 venous insufficiency. - Continue compression hose.  - Continue Lasix 40 mg daily. - Continue bisoprolol 5 mg daily. - Continue Jardiance 10 mg daily. - Continue hydralazine 12.5 mg tid + Imdur 15 mg daily - Repeat echo next visit, now that GDMT reinitiated and PVCs suppressed. - She has completed cardiac rehab. 2. CAD: mild nonobstructive disease by cath 8/23, 25% mRCA plaque. No chest pain.  - Continue ASA + statin  3. HLD: on statin for CAD and h/o CVA. LDL Goal < 70. - Good lipids 6/24. 4. CKD stage 3/4: Creatinine worsening recently, now ~2.7 over past few months. Referred to [facility]. - GDMT as above. BMET today. 5. H/o right  cerebellar CVA: On ASA 81 and statin. No change. 6. OSA: Encouraged CPAP use. 7. PVCs: Zio 7 day (10/24) showed 16.6 % burden. Repeat Zio on amio NSR with rare PVCs (< 0.1%) - Continue amiodarone 200 mg daily. LFTs and TSH okay (11/24), she will need a regular eye exam while taking amiodarone. - Continue bisoprolol.  Follow up in 3 months with Dr. Shirlee Latch + echo.  Jacklynn Ganong, NP 04/09/23

## 2023-04-09 ENCOUNTER — Ambulatory Visit (HOSPITAL_COMMUNITY)
Admission: RE | Admit: 2023-04-09 | Discharge: 2023-04-09 | Disposition: A | Discharge status: Home / Self Care | Payer: PPO | Source: Ambulatory Visit | Attending: Family Medicine | Admitting: Family Medicine

## 2023-04-09 ENCOUNTER — Encounter (HOSPITAL_COMMUNITY): Payer: Self-pay

## 2023-04-09 VITALS — BP 110/62 | HR 60 | Wt 145.4 lb

## 2023-04-09 DIAGNOSIS — N184 Chronic kidney disease, stage 4 (severe): Secondary | ICD-10-CM

## 2023-04-09 DIAGNOSIS — N1832 Chronic kidney disease, stage 3b: Secondary | ICD-10-CM | POA: Insufficient documentation

## 2023-04-09 DIAGNOSIS — Z7984 Long term (current) use of oral hypoglycemic drugs: Secondary | ICD-10-CM | POA: Diagnosis not present

## 2023-04-09 DIAGNOSIS — E785 Hyperlipidemia, unspecified: Secondary | ICD-10-CM | POA: Insufficient documentation

## 2023-04-09 DIAGNOSIS — I428 Other cardiomyopathies: Secondary | ICD-10-CM | POA: Diagnosis not present

## 2023-04-09 DIAGNOSIS — I130 Hypertensive heart and chronic kidney disease with heart failure and stage 1 through stage 4 chronic kidney disease, or unspecified chronic kidney disease: Secondary | ICD-10-CM | POA: Diagnosis not present

## 2023-04-09 DIAGNOSIS — I493 Ventricular premature depolarization: Secondary | ICD-10-CM | POA: Diagnosis not present

## 2023-04-09 DIAGNOSIS — Z5986 Financial insecurity: Secondary | ICD-10-CM | POA: Diagnosis not present

## 2023-04-09 DIAGNOSIS — I5022 Chronic systolic (congestive) heart failure: Secondary | ICD-10-CM | POA: Insufficient documentation

## 2023-04-09 DIAGNOSIS — Z79899 Other long term (current) drug therapy: Secondary | ICD-10-CM | POA: Diagnosis not present

## 2023-04-09 DIAGNOSIS — I2510 Atherosclerotic heart disease of native coronary artery without angina pectoris: Secondary | ICD-10-CM

## 2023-04-09 DIAGNOSIS — I252 Old myocardial infarction: Secondary | ICD-10-CM | POA: Diagnosis not present

## 2023-04-09 DIAGNOSIS — G4733 Obstructive sleep apnea (adult) (pediatric): Secondary | ICD-10-CM | POA: Insufficient documentation

## 2023-04-09 DIAGNOSIS — Z8673 Personal history of transient ischemic attack (TIA), and cerebral infarction without residual deficits: Secondary | ICD-10-CM | POA: Diagnosis not present

## 2023-04-09 DIAGNOSIS — Z87891 Personal history of nicotine dependence: Secondary | ICD-10-CM | POA: Insufficient documentation

## 2023-04-09 LAB — BASIC METABOLIC PANEL
Anion gap: 10 (ref 5–15)
BUN: 45 mg/dL — ABNORMAL HIGH (ref 8–23)
CO2: 28 mmol/L (ref 22–32)
Calcium: 9.5 mg/dL (ref 8.9–10.3)
Chloride: 106 mmol/L (ref 98–111)
Creatinine, Ser: 2.45 mg/dL — ABNORMAL HIGH (ref 0.44–1.00)
GFR, Estimated: 19 mL/min — ABNORMAL LOW (ref >60)
Glucose, Bld: 72 mg/dL (ref 70–99)
Potassium: 3.8 mmol/L (ref 3.5–5.1)
Sodium: 144 mmol/L (ref 135–145)

## 2023-04-09 LAB — BRAIN NATRIURETIC PEPTIDE: B Natriuretic Peptide: >4500 pg/mL — ABNORMAL HIGH (ref 0.0–100.0)

## 2023-04-09 NOTE — Patient Instructions (Addendum)
Thank you for coming in today  If you had labs drawn today, any labs that are abnormal the clinic will call you No news is good news  Medications: No changes  Follow up appointments:  Your physician recommends that you schedule a follow-up appointment in:  3 months With Dr. Shirlee Latch with echocardiogram  You will receive a reminder letter in the mail a few months in advance. If you don't receive a letter, please call our office to schedule the follow-up appointment.   Your physician has requested that you have an echocardiogram. Echocardiography is a painless test that uses sound waves to create images of your heart. It provides your doctor with information about the size and shape of your heart and how well your heart's chambers and valves are working. This procedure takes approximately one hour. There are no restrictions for this procedure.      Do the following things EVERYDAY: Weigh yourself in the morning before breakfast. Write it down and keep it in a log. Take your medicines as prescribed Eat low salt foods--Limit salt (sodium) to 2000 mg per day.  Stay as active as you can everyday Limit all fluids for the day to less than 2 liters   At the [facility], you and your health needs are our priority. As part of our continuing mission to provide you with exceptional heart care, we have created designated Provider Care Teams. These Care Teams include your primary Cardiologist (physician) and Advanced Practice Providers (APPs- Physician Assistants and Nurse Practitioners) who all work together to provide you with the care you need, when you need it.   You may see any of the following providers on your designated Care Team at your next follow up: Dr Arvilla Meres Dr Marca Ancona Dr. Marcos Eke, NP Robbie Lis, [place] [facility] [place], [place] Brynda Peon, NP Karle Plumber, PharmD   Please be sure to bring in all your medications  bottles to every appointment.    Thank you for choosing [facility]  If you have any questions or concerns before your next appointment please send [place] a message through [place] or call our office at [phone].    TO LEAVE A MESSAGE FOR THE NURSE SELECT OPTION 2, PLEASE LEAVE A MESSAGE INCLUDING: YOUR NAME DATE OF BIRTH CALL BACK NUMBER REASON FOR CALL**this is important as we prioritize the call backs  YOU WILL RECEIVE A CALL BACK THE SAME DAY AS LONG AS YOU CALL BEFORE 4:00 PM

## 2023-04-09 NOTE — Addendum Note (Signed)
Encounter addended by: Jacklynn Ganong, FNP on: 04/09/2023 2:56 PM  Actions taken: Clinical Note Signed

## 2023-04-09 NOTE — Progress Notes (Signed)
ReDS Vest / Clip - 04/09/23 1100       ReDS Vest / Clip   Station Marker A    Ruler Value 27    ReDS Value Range Low volume    ReDS Actual Value 24

## 2023-04-17 ENCOUNTER — Other Ambulatory Visit: Payer: Self-pay | Admitting: Internal Medicine

## 2023-04-17 DIAGNOSIS — F5101 Primary insomnia: Secondary | ICD-10-CM

## 2023-05-01 NOTE — Progress Notes (Signed)
 Subjective:    Patient ID: Heather [name], female    DOB: 09-01-1940, 83 y.o.   MRN: [id number]  HPI: Heather [name] is a 83 y.o. female who returns for follow up appointment for chronic pain and medication refill. She states her pain is located in her lower back radiating into her bilateral lower extremities. She rates her pain 8. Her current exercise regime is walking and performing stretching exercises.  Heather [name] Morphine equivalent is 20.00 MME.   Last oral swab was performed on 02/26/2023, it was consistent     Pain Inventory Average Pain 8 Pain Right Now 8 My pain is intermittent, sharp, stabbing, and aching  In the last 24 hours, has pain interfered with the following? General activity 6 Relation with others 6 Enjoyment of life 7 What TIME of day is your pain at its worst? morning  Sleep (in general) Fair  Pain is worse with: walking, bending, and some activites Pain improves with: rest, heat/ice, and medication Relief from Meds: 8  Family History  Problem Relation Age of Onset   High blood pressure Mother    Aneurysm Father    Diabetes Brother    Pancreatitis Brother    Hypertension Niece    Congestive Heart Failure Niece    Social History   Socioeconomic History   Marital status: Divorced    Spouse name: Not on file   Number of children: Not on file   Years of education: Not on file   Highest education level: 9th grade  Occupational History   Not on file  Tobacco Use   Smoking status: Former    Current packs/day: 0.00    Average packs/day: 1 pack/day for 40.0 years (40.0 ttl pk-yrs)    Types: Cigarettes    Start date: 03/20/1945    Quit date: 03/20/1985    Years since quitting: 38.1   Smokeless tobacco: Never  Vaping Use   Vaping status: Never Used  Substance and Sexual Activity   Alcohol use: No   Drug use: No   Sexual activity: Yes    Birth control/protection: Surgical  Other Topics Concern   Not on file  Social History Narrative   Heather [name]  is a 83 year old divorced female who lives alone. She has support of her son and sister.    Social Drivers of Health   Financial Resource Strain: Medium Risk (11/27/2022)   Overall Financial Resource Strain (CARDIA)    Difficulty of Paying Living Expenses: Somewhat hard  Food Insecurity: No Food Insecurity (11/27/2022)   Hunger Vital Sign    Worried About Running Out of Food in the Last Year: Never true    Ran Out of Food in the Last Year: Never true  Recent Concern: Food Insecurity - Food Insecurity Present (11/21/2022)   Hunger Vital Sign    Worried About Running Out of Food in the Last Year: Never true    Ran Out of Food in the Last Year: Sometimes true  Transportation Needs: Unmet Transportation Needs (11/27/2022)   PRAPARE - Administrator, Civil Service (Medical): Yes    Lack of Transportation (Non-Medical): No  Physical Activity: Insufficiently Active (11/27/2022)   Exercise Vital Sign    Days of Exercise per Week: 3 days    Minutes of Exercise per Session: 40 min  Stress: Stress Concern Present (11/27/2022)   [employer] of Occupational Health - Occupational Stress Questionnaire    Feeling of Stress : To some extent  Social Connections: Moderately Isolated (11/27/2022)   Social Connection and Isolation Panel [NHANES]    Frequency of Communication with Friends and Family: More than three times a week    Frequency of Social Gatherings with Friends and Family: More than three times a week    Attends Religious Services: More than 4 times per year    Active Member of [employer] or Organizations: No    Attends Banker Meetings: Never    Marital Status: Widowed   Past Surgical History:  Procedure Laterality Date   ABDOMINAL HYSTERECTOMY     BACK SURGERY     lumbar-disc   BIV PACEMAKER INSERTION CRT-P N/A 07/12/2022   Procedure: BIV PACEMAKER INSERTION CRT-P;  Surgeon: Duke Salvia, MD;  Location: [facility] INVASIVE CV LAB;  Service: Cardiovascular;  Laterality:  N/A;   BUNIONECTOMY Bilateral    FOOT SURGERY Left    repair of "kissing Cousins"   JOINT REPLACEMENT Bilateral    hip    JOINT REPLACEMENT Right 2010 or 2012   knee   KNEE SURGERY     RIGHT/LEFT HEART CATH AND CORONARY ANGIOGRAPHY N/A 10/27/2021   Procedure: RIGHT/LEFT HEART CATH AND CORONARY ANGIOGRAPHY;  Surgeon: Corky Crafts, MD;  Location: [facility] INVASIVE CV LAB;  Service: Cardiovascular;  Laterality: N/A;   SHOULDER ARTHROSCOPY Right    shoulder surgery in [place] about 2000   SHOULDER HEMI-ARTHROPLASTY Left 03/25/2014   Procedure: LEFT SHOULDER HEMI-ARTHROPLASTY;  Surgeon: Vickki Hearing, MD;  Location: [facility];  Service: Orthopedics;  Laterality: Left;   Past Surgical History:  Procedure Laterality Date   ABDOMINAL HYSTERECTOMY     BACK SURGERY     lumbar-disc   BIV PACEMAKER INSERTION CRT-P N/A 07/12/2022   Procedure: BIV PACEMAKER INSERTION CRT-P;  Surgeon: Duke Salvia, MD;  Location: [facility] INVASIVE CV LAB;  Service: Cardiovascular;  Laterality: N/A;   BUNIONECTOMY Bilateral    FOOT SURGERY Left    repair of "kissing Cousins"   JOINT REPLACEMENT Bilateral    hip    JOINT REPLACEMENT Right 2010 or 2012   knee   KNEE SURGERY     RIGHT/LEFT HEART CATH AND CORONARY ANGIOGRAPHY N/A 10/27/2021   Procedure: RIGHT/LEFT HEART CATH AND CORONARY ANGIOGRAPHY;  Surgeon: Corky Crafts, MD;  Location: [facility] INVASIVE CV LAB;  Service: Cardiovascular;  Laterality: N/A;   SHOULDER ARTHROSCOPY Right    shoulder surgery in [place] about 2000   SHOULDER HEMI-ARTHROPLASTY Left 03/25/2014   Procedure: LEFT SHOULDER HEMI-ARTHROPLASTY;  Surgeon: Vickki Hearing, MD;  Location: [facility];  Service: Orthopedics;  Laterality: Left;   Past Medical History:  Diagnosis Date   Anemia    Anxiety    Arthritis    COPD (chronic obstructive pulmonary disease) (HCC)    CVA (cerebral vascular accident) (HCC)    Depression    Dry eyes 04/14/2014   GERD (gastroesophageal reflux disease)     Glaucoma    Gout    HTN (hypertension)    OSA (obstructive sleep apnea)    There were no vitals taken for this visit.  Opioid Risk Score:   Fall Risk Score:  `1  Depression screen [facility] 2/9     03/09/2023    9:49 AM 02/26/2023   10:47 AM 01/15/2023    1:40 PM 12/28/2022   10:51 AM 12/01/2022    9:27 AM 11/09/2022    3:26 PM 10/19/2022   11:03 AM  Depression screen PHQ 2/9  Decreased Interest 0 0 2 0 0  0 0  Down, Depressed, Hopeless 0 0 2 0 0 0 0  PHQ - 2 Score 0 0 4 0 0 0 0  Altered sleeping   3  3    Tired, decreased energy   2  2    Change in appetite   0  3    Feeling bad or failure about yourself    0  0    Trouble concentrating   1  0    Moving slowly or fidgety/restless   0  0    Suicidal thoughts   0  0    PHQ-9 Score   10  8    Difficult doing work/chores   Somewhat difficult  Not difficult at all      Review of Systems  Musculoskeletal:  Positive for back pain and gait problem.  All other systems reviewed and are negative.      Objective:   Physical Exam Vitals and nursing note reviewed.  Constitutional:      Appearance: Normal appearance.  Cardiovascular:     Rate and Rhythm: Normal rate and regular rhythm.     Pulses: Normal pulses.     Heart sounds: Normal heart sounds.  Pulmonary:     Effort: Pulmonary effort is normal.     Breath sounds: Normal breath sounds. No stridor.  Musculoskeletal:     Right lower leg: Edema present.     Left lower leg: Edema present.     Comments: Normal Muscle Bulk and Muscle Testing Reveals:  Upper Extremities: Full ROM and Muscle Strength 5/5  Lumbar Paraspinal Tenderness: L-4-L-5 Lower Extremities: Full ROM and Muscle Strength 5/5 Arises from Table slowly using cane for support Narrow Based Gait     Skin:    General: Skin is warm and dry.  Neurological:     Mental Status: She is alert and oriented to person, place, and time.  Psychiatric:        [name] and Affect: [name] normal.        Behavior: Behavior normal.          Assessment & Plan:  Lumbar Radiculitis: Continue Effexor. Continue to Monitor. 05/02/2023 Chronic Pain Syndrome: Continue Hydrocodone 5 mg-325 one tablet 4 times a day as needed for pain. No prescription ordered today, last fill date  according to PMP is 02/17/2023, she is using it sparingly.  We will continue the opioid monitoring program, this consists of regular clinic visits, examinations, urine drug screen, pill counts as well as use of [place] Controlled Substance Reporting system. A 12 month History has been reviewed on the [place] Controlled Substance Reporting System on 05/02/2023.  F/U in 2 months

## 2023-05-02 ENCOUNTER — Encounter: Payer: Self-pay | Admitting: Registered Nurse

## 2023-05-02 ENCOUNTER — Encounter: Payer: PPO | Attending: Physical Medicine & Rehabilitation | Admitting: Registered Nurse

## 2023-05-02 VITALS — BP 118/71 | HR 60 | Ht 62.5 in | Wt 153.0 lb

## 2023-05-02 DIAGNOSIS — N189 Chronic kidney disease, unspecified: Secondary | ICD-10-CM | POA: Diagnosis not present

## 2023-05-02 DIAGNOSIS — G894 Chronic pain syndrome: Secondary | ICD-10-CM | POA: Insufficient documentation

## 2023-05-02 DIAGNOSIS — M5416 Radiculopathy, lumbar region: Secondary | ICD-10-CM | POA: Insufficient documentation

## 2023-05-02 DIAGNOSIS — D631 Anemia in chronic kidney disease: Secondary | ICD-10-CM | POA: Diagnosis not present

## 2023-05-02 DIAGNOSIS — Z5181 Encounter for therapeutic drug level monitoring: Secondary | ICD-10-CM | POA: Insufficient documentation

## 2023-05-02 DIAGNOSIS — Z79891 Long term (current) use of opiate analgesic: Secondary | ICD-10-CM | POA: Diagnosis not present

## 2023-05-02 DIAGNOSIS — R809 Proteinuria, unspecified: Secondary | ICD-10-CM | POA: Diagnosis not present

## 2023-05-02 NOTE — Patient Instructions (Signed)
 Call office when you have 10 tablets of your Hydrocodone.   Call your Primary Care hyscian and Cardiologist regarding your  Swelling in your Lower extremities .

## 2023-05-08 ENCOUNTER — Inpatient Hospital Stay (HOSPITAL_COMMUNITY)
Admission: EM | Admit: 2023-05-08 | Discharge: 2023-05-21 | DRG: 871 | Disposition: A | Discharge status: Skilled Nursing Facility | Attending: Internal Medicine | Admitting: Internal Medicine

## 2023-05-08 ENCOUNTER — Emergency Department (HOSPITAL_COMMUNITY)

## 2023-05-08 ENCOUNTER — Inpatient Hospital Stay (HOSPITAL_COMMUNITY)

## 2023-05-08 ENCOUNTER — Encounter (HOSPITAL_COMMUNITY): Payer: Self-pay

## 2023-05-08 ENCOUNTER — Other Ambulatory Visit: Payer: Self-pay

## 2023-05-08 DIAGNOSIS — G9341 Metabolic encephalopathy: Secondary | ICD-10-CM | POA: Diagnosis not present

## 2023-05-08 DIAGNOSIS — J811 Chronic pulmonary edema: Secondary | ICD-10-CM | POA: Diagnosis not present

## 2023-05-08 DIAGNOSIS — M1990 Unspecified osteoarthritis, unspecified site: Secondary | ICD-10-CM | POA: Diagnosis present

## 2023-05-08 DIAGNOSIS — E162 Hypoglycemia, unspecified: Secondary | ICD-10-CM

## 2023-05-08 DIAGNOSIS — G4710 Hypersomnia, unspecified: Secondary | ICD-10-CM | POA: Diagnosis present

## 2023-05-08 DIAGNOSIS — Z1152 Encounter for screening for COVID-19: Secondary | ICD-10-CM

## 2023-05-08 DIAGNOSIS — J9811 Atelectasis: Secondary | ICD-10-CM | POA: Diagnosis present

## 2023-05-08 DIAGNOSIS — F419 Anxiety disorder, unspecified: Secondary | ICD-10-CM | POA: Diagnosis present

## 2023-05-08 DIAGNOSIS — I493 Ventricular premature depolarization: Secondary | ICD-10-CM | POA: Diagnosis present

## 2023-05-08 DIAGNOSIS — J9601 Acute respiratory failure with hypoxia: Secondary | ICD-10-CM | POA: Diagnosis present

## 2023-05-08 DIAGNOSIS — I130 Hypertensive heart and chronic kidney disease with heart failure and stage 1 through stage 4 chronic kidney disease, or unspecified chronic kidney disease: Secondary | ICD-10-CM | POA: Diagnosis not present

## 2023-05-08 DIAGNOSIS — E785 Hyperlipidemia, unspecified: Secondary | ICD-10-CM | POA: Diagnosis not present

## 2023-05-08 DIAGNOSIS — I428 Other cardiomyopathies: Secondary | ICD-10-CM | POA: Diagnosis present

## 2023-05-08 DIAGNOSIS — K761 Chronic passive congestion of liver: Secondary | ICD-10-CM | POA: Diagnosis present

## 2023-05-08 DIAGNOSIS — R579 Shock, unspecified: Secondary | ICD-10-CM | POA: Diagnosis not present

## 2023-05-08 DIAGNOSIS — N1832 Chronic kidney disease, stage 3b: Secondary | ICD-10-CM | POA: Diagnosis not present

## 2023-05-08 DIAGNOSIS — J440 Chronic obstructive pulmonary disease with acute lower respiratory infection: Secondary | ICD-10-CM | POA: Diagnosis present

## 2023-05-08 DIAGNOSIS — I5084 End stage heart failure: Secondary | ICD-10-CM | POA: Diagnosis present

## 2023-05-08 DIAGNOSIS — R6520 Severe sepsis without septic shock: Secondary | ICD-10-CM | POA: Diagnosis not present

## 2023-05-08 DIAGNOSIS — I5023 Acute on chronic systolic (congestive) heart failure: Secondary | ICD-10-CM | POA: Diagnosis present

## 2023-05-08 DIAGNOSIS — I272 Pulmonary hypertension, unspecified: Secondary | ICD-10-CM | POA: Diagnosis not present

## 2023-05-08 DIAGNOSIS — M6281 Muscle weakness (generalized): Secondary | ICD-10-CM | POA: Diagnosis not present

## 2023-05-08 DIAGNOSIS — J189 Pneumonia, unspecified organism: Secondary | ICD-10-CM | POA: Diagnosis present

## 2023-05-08 DIAGNOSIS — I10 Essential (primary) hypertension: Secondary | ICD-10-CM | POA: Diagnosis not present

## 2023-05-08 DIAGNOSIS — I071 Rheumatic tricuspid insufficiency: Secondary | ICD-10-CM | POA: Diagnosis present

## 2023-05-08 DIAGNOSIS — R4781 Slurred speech: Secondary | ICD-10-CM | POA: Diagnosis not present

## 2023-05-08 DIAGNOSIS — Z66 Do not resuscitate: Secondary | ICD-10-CM | POA: Diagnosis not present

## 2023-05-08 DIAGNOSIS — I358 Other nonrheumatic aortic valve disorders: Secondary | ICD-10-CM | POA: Diagnosis present

## 2023-05-08 DIAGNOSIS — I5043 Acute on chronic combined systolic (congestive) and diastolic (congestive) heart failure: Secondary | ICD-10-CM

## 2023-05-08 DIAGNOSIS — G9340 Encephalopathy, unspecified: Secondary | ICD-10-CM

## 2023-05-08 DIAGNOSIS — I2489 Other forms of acute ischemic heart disease: Secondary | ICD-10-CM | POA: Diagnosis not present

## 2023-05-08 DIAGNOSIS — Z7982 Long term (current) use of aspirin: Secondary | ICD-10-CM

## 2023-05-08 DIAGNOSIS — N179 Acute kidney failure, unspecified: Secondary | ICD-10-CM | POA: Diagnosis not present

## 2023-05-08 DIAGNOSIS — E11649 Type 2 diabetes mellitus with hypoglycemia without coma: Secondary | ICD-10-CM | POA: Diagnosis present

## 2023-05-08 DIAGNOSIS — Z96612 Presence of left artificial shoulder joint: Secondary | ICD-10-CM | POA: Diagnosis not present

## 2023-05-08 DIAGNOSIS — E1122 Type 2 diabetes mellitus with diabetic chronic kidney disease: Secondary | ICD-10-CM | POA: Diagnosis present

## 2023-05-08 DIAGNOSIS — A419 Sepsis, unspecified organism: Secondary | ICD-10-CM | POA: Diagnosis not present

## 2023-05-08 DIAGNOSIS — Z888 Allergy status to other drugs, medicaments and biological substances status: Secondary | ICD-10-CM

## 2023-05-08 DIAGNOSIS — R911 Solitary pulmonary nodule: Secondary | ICD-10-CM | POA: Diagnosis not present

## 2023-05-08 DIAGNOSIS — Z79899 Other long term (current) drug therapy: Secondary | ICD-10-CM

## 2023-05-08 DIAGNOSIS — J984 Other disorders of lung: Secondary | ICD-10-CM | POA: Diagnosis not present

## 2023-05-08 DIAGNOSIS — Z8249 Family history of ischemic heart disease and other diseases of the circulatory system: Secondary | ICD-10-CM

## 2023-05-08 DIAGNOSIS — I447 Left bundle-branch block, unspecified: Secondary | ICD-10-CM | POA: Diagnosis present

## 2023-05-08 DIAGNOSIS — Z87891 Personal history of nicotine dependence: Secondary | ICD-10-CM

## 2023-05-08 DIAGNOSIS — D649 Anemia, unspecified: Secondary | ICD-10-CM | POA: Diagnosis not present

## 2023-05-08 DIAGNOSIS — Z7401 Bed confinement status: Secondary | ICD-10-CM | POA: Diagnosis not present

## 2023-05-08 DIAGNOSIS — K439 Ventral hernia without obstruction or gangrene: Secondary | ICD-10-CM | POA: Diagnosis present

## 2023-05-08 DIAGNOSIS — R34 Anuria and oliguria: Secondary | ICD-10-CM | POA: Diagnosis present

## 2023-05-08 DIAGNOSIS — R0902 Hypoxemia: Secondary | ICD-10-CM | POA: Diagnosis not present

## 2023-05-08 DIAGNOSIS — R4182 Altered mental status, unspecified: Secondary | ICD-10-CM | POA: Diagnosis not present

## 2023-05-08 DIAGNOSIS — I959 Hypotension, unspecified: Secondary | ICD-10-CM | POA: Diagnosis not present

## 2023-05-08 DIAGNOSIS — E870 Hyperosmolality and hypernatremia: Secondary | ICD-10-CM | POA: Diagnosis present

## 2023-05-08 DIAGNOSIS — Z833 Family history of diabetes mellitus: Secondary | ICD-10-CM

## 2023-05-08 DIAGNOSIS — R000 Tachycardia, unspecified: Secondary | ICD-10-CM | POA: Diagnosis not present

## 2023-05-08 DIAGNOSIS — R54 Age-related physical debility: Secondary | ICD-10-CM | POA: Diagnosis present

## 2023-05-08 DIAGNOSIS — R570 Cardiogenic shock: Secondary | ICD-10-CM | POA: Diagnosis not present

## 2023-05-08 DIAGNOSIS — I6782 Cerebral ischemia: Secondary | ICD-10-CM | POA: Diagnosis not present

## 2023-05-08 DIAGNOSIS — K7200 Acute and subacute hepatic failure without coma: Secondary | ICD-10-CM | POA: Diagnosis not present

## 2023-05-08 DIAGNOSIS — G4733 Obstructive sleep apnea (adult) (pediatric): Secondary | ICD-10-CM | POA: Diagnosis present

## 2023-05-08 DIAGNOSIS — R918 Other nonspecific abnormal finding of lung field: Secondary | ICD-10-CM | POA: Diagnosis not present

## 2023-05-08 DIAGNOSIS — R1312 Dysphagia, oropharyngeal phase: Secondary | ICD-10-CM | POA: Diagnosis not present

## 2023-05-08 DIAGNOSIS — E440 Moderate protein-calorie malnutrition: Secondary | ICD-10-CM | POA: Diagnosis present

## 2023-05-08 DIAGNOSIS — D696 Thrombocytopenia, unspecified: Secondary | ICD-10-CM | POA: Diagnosis present

## 2023-05-08 DIAGNOSIS — N170 Acute kidney failure with tubular necrosis: Secondary | ICD-10-CM | POA: Diagnosis present

## 2023-05-08 DIAGNOSIS — R402421 Glasgow coma scale score 9-12, in the field [EMT or ambulance]: Secondary | ICD-10-CM | POA: Diagnosis not present

## 2023-05-08 DIAGNOSIS — Z90710 Acquired absence of both cervix and uterus: Secondary | ICD-10-CM

## 2023-05-08 DIAGNOSIS — G894 Chronic pain syndrome: Secondary | ICD-10-CM | POA: Diagnosis present

## 2023-05-08 DIAGNOSIS — E872 Acidosis, unspecified: Secondary | ICD-10-CM | POA: Diagnosis not present

## 2023-05-08 DIAGNOSIS — R531 Weakness: Secondary | ICD-10-CM | POA: Diagnosis not present

## 2023-05-08 DIAGNOSIS — E119 Type 2 diabetes mellitus without complications: Secondary | ICD-10-CM | POA: Diagnosis not present

## 2023-05-08 DIAGNOSIS — Z7951 Long term (current) use of inhaled steroids: Secondary | ICD-10-CM

## 2023-05-08 DIAGNOSIS — R5383 Other fatigue: Secondary | ICD-10-CM | POA: Diagnosis not present

## 2023-05-08 DIAGNOSIS — J90 Pleural effusion, not elsewhere classified: Secondary | ICD-10-CM | POA: Diagnosis not present

## 2023-05-08 DIAGNOSIS — J449 Chronic obstructive pulmonary disease, unspecified: Secondary | ICD-10-CM | POA: Diagnosis not present

## 2023-05-08 DIAGNOSIS — Z7984 Long term (current) use of oral hypoglycemic drugs: Secondary | ICD-10-CM

## 2023-05-08 DIAGNOSIS — Z4682 Encounter for fitting and adjustment of non-vascular catheter: Secondary | ICD-10-CM | POA: Diagnosis not present

## 2023-05-08 DIAGNOSIS — R0989 Other specified symptoms and signs involving the circulatory and respiratory systems: Secondary | ICD-10-CM | POA: Diagnosis not present

## 2023-05-08 DIAGNOSIS — Z950 Presence of cardiac pacemaker: Secondary | ICD-10-CM

## 2023-05-08 DIAGNOSIS — H409 Unspecified glaucoma: Secondary | ICD-10-CM | POA: Diagnosis present

## 2023-05-08 DIAGNOSIS — N183 Chronic kidney disease, stage 3 unspecified: Secondary | ICD-10-CM

## 2023-05-08 DIAGNOSIS — R404 Transient alteration of awareness: Secondary | ICD-10-CM | POA: Diagnosis not present

## 2023-05-08 DIAGNOSIS — Z8619 Personal history of other infectious and parasitic diseases: Secondary | ICD-10-CM

## 2023-05-08 DIAGNOSIS — N184 Chronic kidney disease, stage 4 (severe): Secondary | ICD-10-CM | POA: Diagnosis not present

## 2023-05-08 DIAGNOSIS — F32A Depression, unspecified: Secondary | ICD-10-CM | POA: Diagnosis present

## 2023-05-08 DIAGNOSIS — Z860101 Personal history of adenomatous and serrated colon polyps: Secondary | ICD-10-CM

## 2023-05-08 DIAGNOSIS — I5082 Biventricular heart failure: Secondary | ICD-10-CM | POA: Diagnosis present

## 2023-05-08 DIAGNOSIS — R0602 Shortness of breath: Secondary | ICD-10-CM | POA: Diagnosis not present

## 2023-05-08 DIAGNOSIS — Z471 Aftercare following joint replacement surgery: Secondary | ICD-10-CM | POA: Diagnosis not present

## 2023-05-08 DIAGNOSIS — K219 Gastro-esophageal reflux disease without esophagitis: Secondary | ICD-10-CM | POA: Diagnosis not present

## 2023-05-08 DIAGNOSIS — R7401 Elevation of levels of liver transaminase levels: Secondary | ICD-10-CM | POA: Diagnosis not present

## 2023-05-08 DIAGNOSIS — I5022 Chronic systolic (congestive) heart failure: Secondary | ICD-10-CM | POA: Diagnosis not present

## 2023-05-08 DIAGNOSIS — D631 Anemia in chronic kidney disease: Secondary | ICD-10-CM | POA: Diagnosis not present

## 2023-05-08 DIAGNOSIS — R6521 Severe sepsis with septic shock: Secondary | ICD-10-CM | POA: Diagnosis not present

## 2023-05-08 DIAGNOSIS — J181 Lobar pneumonia, unspecified organism: Secondary | ICD-10-CM | POA: Diagnosis not present

## 2023-05-08 DIAGNOSIS — Z5986 Financial insecurity: Secondary | ICD-10-CM

## 2023-05-08 DIAGNOSIS — M5416 Radiculopathy, lumbar region: Secondary | ICD-10-CM | POA: Diagnosis present

## 2023-05-08 DIAGNOSIS — I517 Cardiomegaly: Secondary | ICD-10-CM | POA: Diagnosis not present

## 2023-05-08 DIAGNOSIS — R7989 Other specified abnormal findings of blood chemistry: Secondary | ICD-10-CM

## 2023-05-08 DIAGNOSIS — Z751 Person awaiting admission to adequate facility elsewhere: Secondary | ICD-10-CM

## 2023-05-08 DIAGNOSIS — E876 Hypokalemia: Secondary | ICD-10-CM | POA: Diagnosis not present

## 2023-05-08 DIAGNOSIS — Z6829 Body mass index (BMI) 29.0-29.9, adult: Secondary | ICD-10-CM

## 2023-05-08 DIAGNOSIS — F5101 Primary insomnia: Secondary | ICD-10-CM | POA: Diagnosis present

## 2023-05-08 DIAGNOSIS — Z555 Less than a high school diploma: Secondary | ICD-10-CM

## 2023-05-08 DIAGNOSIS — R1310 Dysphagia, unspecified: Secondary | ICD-10-CM | POA: Diagnosis present

## 2023-05-08 DIAGNOSIS — Z8673 Personal history of transient ischemic attack (TIA), and cerebral infarction without residual deficits: Secondary | ICD-10-CM

## 2023-05-08 DIAGNOSIS — D689 Coagulation defect, unspecified: Secondary | ICD-10-CM | POA: Diagnosis present

## 2023-05-08 DIAGNOSIS — Z452 Encounter for adjustment and management of vascular access device: Secondary | ICD-10-CM | POA: Diagnosis not present

## 2023-05-08 DIAGNOSIS — I2510 Atherosclerotic heart disease of native coronary artery without angina pectoris: Secondary | ICD-10-CM | POA: Diagnosis present

## 2023-05-08 DIAGNOSIS — I700 Atherosclerosis of aorta: Secondary | ICD-10-CM | POA: Diagnosis not present

## 2023-05-08 DIAGNOSIS — N289 Disorder of kidney and ureter, unspecified: Secondary | ICD-10-CM | POA: Diagnosis not present

## 2023-05-08 DIAGNOSIS — Z931 Gastrostomy status: Secondary | ICD-10-CM

## 2023-05-08 HISTORY — DX: Type 2 diabetes mellitus without complications: E11.9

## 2023-05-08 LAB — COOXEMETRY PANEL
Carboxyhemoglobin: 1.5 % (ref 0.5–1.5)
Carboxyhemoglobin: 1.4 % (ref 0.5–1.5)
Methemoglobin: <0.7 % (ref 0.0–1.5)
Methemoglobin: <0.7 % (ref 0.0–1.5)
O2 Saturation: 61.4 %
O2 Saturation: 58.1 %
Total hemoglobin: 11.1 g/dL — ABNORMAL LOW (ref 12.0–16.0)
Total hemoglobin: 10.4 g/dL — ABNORMAL LOW (ref 12.0–16.0)

## 2023-05-08 LAB — CBC
HCT: 33.5 % — ABNORMAL LOW (ref 36.0–46.0)
Hemoglobin: 10.9 g/dL — ABNORMAL LOW (ref 12.0–15.0)
MCH: 32.6 pg (ref 26.0–34.0)
MCHC: 32.5 g/dL (ref 30.0–36.0)
MCV: 100.3 fL — ABNORMAL HIGH (ref 80.0–100.0)
Platelets: 113 10*3/uL — ABNORMAL LOW (ref 150–400)
RBC: 3.34 MIL/uL — ABNORMAL LOW (ref 3.87–5.11)
RDW: 15.8 % — ABNORMAL HIGH (ref 11.5–15.5)
WBC: 12.3 10*3/uL — ABNORMAL HIGH (ref 4.0–10.5)
nRBC: 1.9 % — ABNORMAL HIGH (ref 0.0–0.2)

## 2023-05-08 LAB — COMPREHENSIVE METABOLIC PANEL
ALT: 1762 U/L — ABNORMAL HIGH (ref 0–44)
ALT: 2548 U/L — ABNORMAL HIGH (ref 0–44)
AST: 2396 U/L — ABNORMAL HIGH (ref 15–41)
AST: 4662 U/L — ABNORMAL HIGH (ref 15–41)
Albumin: 2.9 g/dL — ABNORMAL LOW (ref 3.5–5.0)
Albumin: 2.7 g/dL — ABNORMAL LOW (ref 3.5–5.0)
Alkaline Phosphatase: 205 U/L — ABNORMAL HIGH (ref 38–126)
Alkaline Phosphatase: 202 U/L — ABNORMAL HIGH (ref 38–126)
Anion gap: 24 — ABNORMAL HIGH (ref 5–15)
Anion gap: 23 — ABNORMAL HIGH (ref 5–15)
BUN: 99 mg/dL — ABNORMAL HIGH (ref 8–23)
BUN: 102 mg/dL — ABNORMAL HIGH (ref 8–23)
CO2: 13 mmol/L — ABNORMAL LOW (ref 22–32)
CO2: 15 mmol/L — ABNORMAL LOW (ref 22–32)
Calcium: 8.7 mg/dL — ABNORMAL LOW (ref 8.9–10.3)
Calcium: 8.3 mg/dL — ABNORMAL LOW (ref 8.9–10.3)
Chloride: 98 mmol/L (ref 98–111)
Chloride: 102 mmol/L (ref 98–111)
Creatinine, Ser: 4.71 mg/dL — ABNORMAL HIGH (ref 0.44–1.00)
Creatinine, Ser: 4.82 mg/dL — ABNORMAL HIGH (ref 0.44–1.00)
GFR, Estimated: 9 mL/min — ABNORMAL LOW (ref >60)
GFR, Estimated: 9 mL/min — ABNORMAL LOW (ref >60)
Glucose, Bld: 291 mg/dL — ABNORMAL HIGH (ref 70–99)
Glucose, Bld: 115 mg/dL — ABNORMAL HIGH (ref 70–99)
Potassium: 4.9 mmol/L (ref 3.5–5.1)
Potassium: 4.4 mmol/L (ref 3.5–5.1)
Sodium: 135 mmol/L (ref 135–145)
Sodium: 140 mmol/L (ref 135–145)
Total Bilirubin: 2.9 mg/dL — ABNORMAL HIGH (ref 0.0–1.2)
Total Bilirubin: 3.6 mg/dL — ABNORMAL HIGH (ref 0.0–1.2)
Total Protein: 5.7 g/dL — ABNORMAL LOW (ref 6.5–8.1)
Total Protein: 5.2 g/dL — ABNORMAL LOW (ref 6.5–8.1)

## 2023-05-08 LAB — CBG MONITORING, ED
Glucose-Capillary: 121 mg/dL — ABNORMAL HIGH (ref 70–99)
Glucose-Capillary: 135 mg/dL — ABNORMAL HIGH (ref 70–99)
Glucose-Capillary: <10 mg/dL — CL (ref 70–99)
Glucose-Capillary: 69 mg/dL — ABNORMAL LOW (ref 70–99)

## 2023-05-08 LAB — URINALYSIS, W/ REFLEX TO CULTURE (INFECTION SUSPECTED)
Bacteria, UA: NONE SEEN
Bilirubin Urine: NEGATIVE
Glucose, UA: 50 mg/dL — AB
Hgb urine dipstick: NEGATIVE
Ketones, ur: NEGATIVE mg/dL
Leukocytes,Ua: NEGATIVE
Nitrite: NEGATIVE
Protein, ur: 100 mg/dL — AB
Specific Gravity, Urine: 1.016 (ref 1.005–1.030)
pH: 5 (ref 5.0–8.0)

## 2023-05-08 LAB — I-STAT CHEM 8, ED
BUN: 102 mg/dL — ABNORMAL HIGH (ref 8–23)
Calcium, Ion: 1.03 mmol/L — ABNORMAL LOW (ref 1.15–1.40)
Chloride: 105 mmol/L (ref 98–111)
Creatinine, Ser: 4.6 mg/dL — ABNORMAL HIGH (ref 0.44–1.00)
Glucose, Bld: 251 mg/dL — ABNORMAL HIGH (ref 70–99)
HCT: 40 % (ref 36.0–46.0)
Hemoglobin: 13.6 g/dL (ref 12.0–15.0)
Potassium: 4.8 mmol/L (ref 3.5–5.1)
Sodium: 136 mmol/L (ref 135–145)
TCO2: 16 mmol/L — ABNORMAL LOW (ref 22–32)

## 2023-05-08 LAB — RESPIRATORY PANEL BY PCR

## 2023-05-08 LAB — BLOOD GAS, VENOUS
Acid-base deficit: 19.2 mmol/L — ABNORMAL HIGH (ref 0.0–2.0)
Bicarbonate: 12.2 mmol/L — ABNORMAL LOW (ref 20.0–28.0)
Drawn by: 34092
O2 Saturation: 43.7 %
Patient temperature: 38.1
pCO2, Ven: 54 mmHg (ref 44–60)
pH, Ven: 6.97 — CL (ref 7.25–7.43)
pO2, Ven: 42 mmHg (ref 32–45)

## 2023-05-08 LAB — GLUCOSE, CAPILLARY
Glucose-Capillary: 128 mg/dL — ABNORMAL HIGH (ref 70–99)
Glucose-Capillary: 82 mg/dL (ref 70–99)
Glucose-Capillary: 99 mg/dL (ref 70–99)

## 2023-05-08 LAB — RESP PANEL BY RT-PCR (RSV, FLU A&B, COVID)  RVPGX2
Influenza A by PCR: NEGATIVE
Influenza B by PCR: NEGATIVE
Resp Syncytial Virus by PCR: NEGATIVE
SARS Coronavirus 2 by RT PCR: NEGATIVE

## 2023-05-08 LAB — PROTIME-INR
INR: 3.2 — ABNORMAL HIGH (ref 0.8–1.2)
Prothrombin Time: 32.6 s — ABNORMAL HIGH (ref 11.4–15.2)

## 2023-05-08 LAB — CBC WITH DIFFERENTIAL/PLATELET
Abs Immature Granulocytes: 0.15 10*3/uL — ABNORMAL HIGH (ref 0.00–0.07)
Basophils Absolute: 0 10*3/uL (ref 0.0–0.1)
Basophils Relative: 0 %
Eosinophils Absolute: 0 10*3/uL (ref 0.0–0.5)
Eosinophils Relative: 0 %
HCT: 36.8 % (ref 36.0–46.0)
Hemoglobin: 11.4 g/dL — ABNORMAL LOW (ref 12.0–15.0)
Immature Granulocytes: 1 %
Lymphocytes Relative: 5 %
Lymphs Abs: 0.6 10*3/uL — ABNORMAL LOW (ref 0.7–4.0)
MCH: 32.5 pg (ref 26.0–34.0)
MCHC: 31 g/dL (ref 30.0–36.0)
MCV: 104.8 fL — ABNORMAL HIGH (ref 80.0–100.0)
Monocytes Absolute: 0.7 10*3/uL (ref 0.1–1.0)
Monocytes Relative: 5 %
Neutro Abs: 11 10*3/uL — ABNORMAL HIGH (ref 1.7–7.7)
Neutrophils Relative %: 89 %
Platelets: 132 10*3/uL — ABNORMAL LOW (ref 150–400)
RBC: 3.51 MIL/uL — ABNORMAL LOW (ref 3.87–5.11)
RDW: 16 % — ABNORMAL HIGH (ref 11.5–15.5)
WBC: 12.4 10*3/uL — ABNORMAL HIGH (ref 4.0–10.5)
nRBC: 1.2 % — ABNORMAL HIGH (ref 0.0–0.2)

## 2023-05-08 LAB — APTT: aPTT: 32 s (ref 24–36)

## 2023-05-08 LAB — TROPONIN I (HIGH SENSITIVITY)
Troponin I (High Sensitivity): 245 ng/L (ref <18)
Troponin I (High Sensitivity): 258 ng/L (ref <18)

## 2023-05-08 LAB — MRSA NEXT GEN BY PCR, NASAL: MRSA by PCR Next Gen: NOT DETECTED

## 2023-05-08 LAB — LACTIC ACID, PLASMA
Lactic Acid, Venous: >9 mmol/L (ref 0.5–1.9)
Lactic Acid, Venous: >9 mmol/L (ref 0.5–1.9)
Lactic Acid, Venous: >9 mmol/L (ref 0.5–1.9)

## 2023-05-08 LAB — CK: Total CK: 345 U/L — ABNORMAL HIGH (ref 38–234)

## 2023-05-08 LAB — PROCALCITONIN: Procalcitonin: 2.21 ng/mL

## 2023-05-08 LAB — BRAIN NATRIURETIC PEPTIDE: B Natriuretic Peptide: >4500 pg/mL — ABNORMAL HIGH (ref 0.0–100.0)

## 2023-05-08 MED ORDER — SODIUM CHLORIDE 0.9 % IV SOLN
1.0000 g | INTRAVENOUS | Status: DC
  Filled 2023-05-08: qty 10

## 2023-05-08 MED ORDER — SODIUM BICARBONATE 8.4 % IV SOLN
100.0000 meq | Freq: Once | INTRAVENOUS | Status: AC
  Administered 2023-05-08: 100 meq via INTRAVENOUS
  Filled 2023-05-08: qty 50

## 2023-05-08 MED ORDER — LACTATED RINGERS IV SOLN: INTRAVENOUS | Status: DC

## 2023-05-08 MED ORDER — SODIUM CHLORIDE 0.9% FLUSH: 10.0000 mL | INTRAVENOUS | Status: DC | PRN

## 2023-05-08 MED ORDER — INSULIN ASPART 100 UNIT/ML IJ SOLN: 0.0000 [IU] | INTRAMUSCULAR | Status: DC

## 2023-05-08 MED ORDER — CALCIUM GLUCONATE-NACL 1-0.675 GM/50ML-% IV SOLN
1.0000 g | Freq: Once | INTRAVENOUS | Status: AC
  Administered 2023-05-08: 1000 mg via INTRAVENOUS
  Filled 2023-05-08: qty 50

## 2023-05-08 MED ORDER — DEXTROSE 50 % IV SOLN
INTRAVENOUS | Status: AC
  Filled 2023-05-08: qty 50

## 2023-05-08 MED ORDER — MILRINONE LACTATE IN DEXTROSE 20-5 MG/100ML-% IV SOLN
0.1250 ug/kg/min | INTRAVENOUS | Status: DC
  Administered 2023-05-08 – 2023-05-11 (×4): 0.25 ug/kg/min via INTRAVENOUS
  Administered 2023-05-12: 0.125 ug/kg/min via INTRAVENOUS
  Filled 2023-05-08 (×7): qty 100

## 2023-05-08 MED ORDER — SODIUM CHLORIDE 0.9 % IV SOLN
100.0000 mg | Freq: Two times a day (BID) | INTRAVENOUS | Status: AC
  Administered 2023-05-08 – 2023-05-15 (×14): 100 mg via INTRAVENOUS
  Filled 2023-05-08 (×17): qty 100

## 2023-05-08 MED ORDER — SODIUM CHLORIDE 0.9 % IV SOLN
2.0000 g | Freq: Once | INTRAVENOUS | Status: AC
  Administered 2023-05-08: 2 g via INTRAVENOUS
  Filled 2023-05-08: qty 12.5

## 2023-05-08 MED ORDER — ACETAMINOPHEN 650 MG RE SUPP
650.0000 mg | Freq: Once | RECTAL | Status: AC
  Administered 2023-05-08: 650 mg via RECTAL
  Filled 2023-05-08: qty 1

## 2023-05-08 MED ORDER — DEXTROSE 50 % IV SOLN
1.0000 | Freq: Once | INTRAVENOUS | Status: AC
  Administered 2023-05-08: 50 mL via INTRAVENOUS

## 2023-05-08 MED ORDER — ARFORMOTEROL TARTRATE 15 MCG/2ML IN NEBU
15.0000 ug | INHALATION_SOLUTION | Freq: Two times a day (BID) | RESPIRATORY_TRACT | Status: DC
  Administered 2023-05-08 – 2023-05-21 (×24): 15 ug via RESPIRATORY_TRACT
  Filled 2023-05-08 (×25): qty 2

## 2023-05-08 MED ORDER — SODIUM BICARBONATE 8.4 % IV SOLN
INTRAVENOUS | Status: DC
  Filled 2023-05-08: qty 150
  Filled 2023-05-08: qty 1000

## 2023-05-08 MED ORDER — METRONIDAZOLE 500 MG/100ML IV SOLN
500.0000 mg | Freq: Once | INTRAVENOUS | Status: AC
  Administered 2023-05-08: 500 mg via INTRAVENOUS
  Filled 2023-05-08: qty 100

## 2023-05-08 MED ORDER — LACTATED RINGERS IV BOLUS (SEPSIS)
1100.0000 mL | Freq: Once | INTRAVENOUS | Status: AC
  Administered 2023-05-08: 1100 mL via INTRAVENOUS

## 2023-05-08 MED ORDER — VANCOMYCIN HCL IN DEXTROSE 1-5 GM/200ML-% IV SOLN: 1000.0000 mg | Freq: Once | INTRAVENOUS | Status: DC

## 2023-05-08 MED ORDER — NOREPINEPHRINE 4 MG/250ML-% IV SOLN
0.0000 ug/min | INTRAVENOUS | Status: DC
Start: 2023-05-08 — End: 2023-05-08
  Filled 2023-05-08: qty 250

## 2023-05-08 MED ORDER — BUDESONIDE 0.5 MG/2ML IN SUSP
0.5000 mg | Freq: Two times a day (BID) | RESPIRATORY_TRACT | Status: DC
  Administered 2023-05-08 – 2023-05-21 (×25): 0.5 mg via RESPIRATORY_TRACT
  Filled 2023-05-08 (×25): qty 2

## 2023-05-08 MED ORDER — REVEFENACIN 175 MCG/3ML IN SOLN
175.0000 ug | Freq: Every day | RESPIRATORY_TRACT | Status: DC
  Administered 2023-05-08 – 2023-05-21 (×13): 175 ug via RESPIRATORY_TRACT
  Filled 2023-05-08 (×13): qty 3

## 2023-05-08 MED ORDER — DOCUSATE SODIUM 100 MG PO CAPS: 100.0000 mg | ORAL_CAPSULE | Freq: Two times a day (BID) | ORAL | Status: DC | PRN

## 2023-05-08 MED ORDER — NOREPINEPHRINE 4 MG/250ML-% IV SOLN
2.0000 ug/min | INTRAVENOUS | Status: DC
  Administered 2023-05-09: 3 ug/min via INTRAVENOUS
  Administered 2023-05-09: 2 ug/min via INTRAVENOUS
  Administered 2023-05-10: 3 ug/min via INTRAVENOUS
  Filled 2023-05-08 (×2): qty 250

## 2023-05-08 MED ORDER — CHLORHEXIDINE GLUCONATE CLOTH 2 % EX PADS
6.0000 | MEDICATED_PAD | Freq: Every day | CUTANEOUS | Status: DC
  Administered 2023-05-08 – 2023-05-13 (×7): 6 via TOPICAL

## 2023-05-08 MED ORDER — VANCOMYCIN VARIABLE DOSE PER UNSTABLE RENAL FUNCTION (PHARMACIST DOSING): Status: DC

## 2023-05-08 MED ORDER — SODIUM CHLORIDE 0.9 % IV SOLN
250.0000 mL | INTRAVENOUS | Status: AC
  Administered 2023-05-08: 500 mL via INTRAVENOUS

## 2023-05-08 MED ORDER — POLYETHYLENE GLYCOL 3350 17 G PO PACK: 17.0000 g | PACK | Freq: Every day | ORAL | Status: DC | PRN

## 2023-05-08 MED ORDER — LACTATED RINGERS IV BOLUS (SEPSIS)
1000.0000 mL | Freq: Once | INTRAVENOUS | Status: AC
  Administered 2023-05-08: 1000 mL via INTRAVENOUS

## 2023-05-08 MED ORDER — VANCOMYCIN HCL 1250 MG/250ML IV SOLN
1250.0000 mg | Freq: Once | INTRAVENOUS | Status: AC
  Administered 2023-05-08: 1250 mg via INTRAVENOUS
  Filled 2023-05-08: qty 250

## 2023-05-08 MED ORDER — SODIUM CHLORIDE 0.9% FLUSH
10.0000 mL | Freq: Two times a day (BID) | INTRAVENOUS | Status: DC
  Administered 2023-05-08: 20 mL
  Administered 2023-05-09 – 2023-05-13 (×7): 10 mL

## 2023-05-08 NOTE — Procedures (Signed)
 Central Venous Catheter Insertion Procedure Note  Heather [name]  [id number]  08-18-1940  Date:05/08/23  Time:6:39 PM   Provider Performing:Sorina Derrig [name]   Procedure: Insertion of Non-tunneled Central Venous [phone]) with US guidance ([id number])   Indication(s) Difficult access  Consent Risks of the procedure as well as the alternatives and risks of each were explained to the patient and/or caregiver.  Consent for the procedure was obtained and is signed in the bedside chart  Anesthesia Topical only with 1% lidocaine   Timeout Verified patient identification, verified procedure, site/side was marked, verified correct patient position, special equipment/implants available, medications/allergies/relevant history reviewed, required imaging and test results available.  Sterile Technique Maximal sterile technique including full sterile barrier drape, hand hygiene, sterile gown, sterile gloves, mask, hair covering, sterile ultrasound probe cover (if used).  Procedure Description Area of catheter insertion was cleaned with chlorhexidine and draped in sterile fashion.  With real-time ultrasound guidance a central venous catheter was placed into the right internal jugular vein. Nonpulsatile blood flow and easy flushing noted in all ports.  The catheter was sutured in place and sterile dressing applied.  Complications/Tolerance None; patient tolerated the procedure well. Chest X-ray is ordered to verify placement for internal jugular or subclavian cannulation.   Chest x-ray is not ordered for femoral cannulation.  EBL Minimal  Specimen(s) None   Rutherford Guys, PA - [facility] For pager details, please see AMION or use Epic chat  After 1900, please call [facility] for cross coverage needs 05/08/2023, 6:40 PM

## 2023-05-08 NOTE — ED Notes (Signed)
 Pt changed to N/C 4lpm

## 2023-05-08 NOTE — Progress Notes (Signed)
 Pharmacy Antibiotic Note  Heather [name] is a 83 y.o. female admitted on 05/08/2023 with shock - septic vs cardiogenic per CCM.  Pharmacy has been consulted for vancomycin and cefepime dosing.  Patient received vancomycin 1250 mg @ 14:13 today. Cefepime and Flagyl were also given.   WBC 12.4, Tmax 100.5, lactic acid > 9, AST/ATL elevated 2396/1762, Scr 4.71. Norepinephrine ordered. On bicarb infusion.   Plan: Vancomycin variable dose per unstable renal function  Since the patient was not given a full loading dose of 20-25 mg/kg, will check random level in the AM.  Cefepime 1 g q24h  Follow-up renal function, cultures   Height: 5\' 2"  (157.5 cm) Weight: 72.6 kg (160 lb 0.9 oz) IBW/kg (Calculated) : 50.1  Temp (24hrs), Avg:99.8 F (37.7 C), Min:99 F (37.2 C), Max:100.5 F (38.1 C)  Recent Labs  Lab 05/08/23 1155 05/08/23 1212 05/08/23 1349 05/08/23 1512  WBC 12.4*  --   --   --   CREATININE 4.71* 4.60*  --   --   LATICACIDVEN >9.0*  --  >9.0* >9.0*    Estimated Creatinine Clearance: 8.9 mL/min (A) (by C-G formula based on SCr of 4.6 mg/dL (H)).    Allergies  Allergen Reactions   Lovastatin Swelling    Patient states that her tongue swells and she gets a tingling feeling all over.   Lisinopril Swelling    Tongue swelling    Antimicrobials this admission: Vancomycin 3/4 >> c Cefepime 3/4 >> c Flagyl 3/4 x1   Microbiology results: 3/4 BCx: pending  3/4 MRSA PCR: pending  Thank you for allowing pharmacy to be a part of this patient's care.  Cedric Fishman 05/08/2023 6:17 PM

## 2023-05-08 NOTE — Sepsis Progress Note (Signed)
 [facility] monitoring for the code sepsis protocol.

## 2023-05-08 NOTE — ED Triage Notes (Signed)
 Pt BIB ems for AMS for hypoglycemic episode. Per EMS the police were sent out for a well visit and on arrival pt's CBG 44 pt was given 1 mg glucagon and on repeat CBG 24. Upon arrival pt AMS and CBG low.

## 2023-05-08 NOTE — Sepsis Progress Note (Signed)
 Notified bedside nurse of need to draw repeat lactic acid.

## 2023-05-08 NOTE — H&P (Signed)
 NAME:  Heather [name], MRN:  [id number], DOB:  [birth year], LOS: 0 ADMISSION DATE:  05/08/2023, CONSULTATION DATE:  05/08/23 REFERRING MD:  Charm Barges CHIEF COMPLAINT:  Weakness, AMS   History of Present Illness:  Heather [name] is a 83 y.o. female who has a PMH as below. Police was called 3/4 for a wellness visit and upon their arrival, she was found to have AMS. She was last known well the day prior. EMS was called and upon their arrival, CBG was 40 and she was given IM glucagon before being transported to [facility] ED.   In ED, initial CBG too low to pickup. ABG with profound metabolic acidosis (6.97/54/42). Workup also noteable for AKI with SCr 4.71/BUN 99, AG 24, ALT 1762, trop 245, lactate > 9, WBC 12.4. Tmax 100.5 rectally, BP and HR normal. UA unremarkable. CT head and CT chest/abd/pelv pending.  Due to her multiple metabolic derangements, PCCM called for transfer to [facility] ICU.  Of note, cMRI in Jan 2024 showed LVEF 16%, mild apical inferolateral subendocardial LGE, mod dilated RV with RVEF 23%. Echo from April 2024 showed EF < 20% with mod LV enlargement, septal lateral dyssynchrony, mod RV dysfunction, PASP 65. She was referred to EP and underwent [employer] CRT-P placement 5/24. Echo 9/24, EF 20-25%, mild LV dilation, moderate RV dysfunction with normal size.   On arrival at [facility] BG was 99.  Pertinent  Medical History:  has Rotator cuff arthropathy; Renal insufficiency; Essential hypertension; Gout; Long-term current use of opiate analgesic; Bilateral carpal tunnel syndrome; Chronic low back pain; Lumbosacral radiculopathy; Polyarthropathy; Sacroiliac joint pain; Preventative health care; Hyperlipidemia; Anxiety; COPD GOLD 1 ; Gastroesophageal reflux disease; Intertrigo; AKI (acute kidney injury) (HCC); Prediabetes; Leg edema; DOE (dyspnea on exertion); Chronic kidney disease, stage IV (severe) (HCC); Tremor; Acute kidney injury (HCC); Leg swelling; SOBOE (shortness of breath on exertion); Hyperkalemia; Acute  systolic CHF (congestive heart failure) (HCC); History of cerebellar stroke; OSA on CPAP; Bloating; Acute on chronic systolic CHF (congestive heart failure) (HCC); Prolonged QT interval; Chronic systolic HF (heart failure) (HCC); LVF (left ventricular failure) (HCC); 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  Significant Hospital Events: Including procedures, antibiotic start and stop dates in addition to other pertinent events   3/4 admit.  Interim History / Subjective:    Objective:  Blood pressure 104/60, pulse 60, temperature (!) 100.5 F (38.1 C), temperature source Rectal, resp. rate (!) 32, height 5\' 2"  (1.575 m), weight 69.4 kg, SpO2 95%.       No intake or output data in the 24 hours ending 05/08/23 1344 Filed Weights   05/08/23 1156  Weight: 69.4 kg    Examination: General: ill appearing woman lying in bed in NAD, lethargic Neuro: arouses to stimulation, able to answer questions HEENT: [place]/AT, eyes anicteric Cardiovascular: S1S2, RRR, JVD to angle of mandible Lungs: tachypnea, no accessory muscle use. Decreased basilar breath sounds.  Abdomen: soft, NT Musculoskeletal: minimal edema, no cyanosis, cool feet Skin: no diffuse rashes  VBG 6.97/54/42/12.2 CK 245 Trop 258 LA >9 x 3  BUN 99 Cr 4.71 AST 2396 ALT 1762 T bili 2.9 WBC 12.4 H/H 11.4/36.8 Platelets 132 INR 3.2  CT personally reviewed> bibasilar infiltrates with air bronchograms, no significant effusions. Cardiomegaly with stents.  Patulous esophagus,  EKG personally reviewed> AV paced, no ischemic changes. Prolonged QRS & QTc  Labs/imaging personally reviewed:  CT head 3/4 > neg. CT  chest/abd/pelv 3/4 > atelectasis, 7mm RML nodule.  Assessment & Plan:   Shock - concern combo cardiogenic and possibly septic given low grade fever and borderline leukocytosis P:  Broad spectrum abx (Vanc/Cefepime) Pan culture  NE as needed for goal MAP > 65 Will place CVL and get coox and CVPs; starting milrinone empirically with cool extremities, JVD Repeat echo Trend lactate  check PCT, RVP   Acute respiratory failure with hypoxia, possibly bibasilar pneumonia Hx COPD  P: Supplemental O2 as needed to keep sats >92%  Budesonide/Brovana/Yupelri in lieu of PTA Trelegy   Severe lactic acidosis, not on metformin PTA; worry about heart failure -serial lactates -Central line, check coox, empirically planning to start milrinone -bicarb gtt  AKI on CKD3  Severe acute metabolic acidosis - ? Component of hypoglycemic DKA from Jardiance use Hypocalcemia P:  Volume resuscitation given in ED; need to check CVP 2 amps HCO3 given already prior to transfer; start HCO3 infusion now 1g Ca gluconate Holding home lasix, metolazone  F/u chem, trend lactate Strict I/O   Hx DM  Hypoglycemia on presentation - resolved with IM glucagon  P:  Frequent accuchecks overnight; SSI  can be added if needed Holding home jardiance    Acute on chronic systolic CHF ; concern for component of cardiogenic shock NICM last EF 20-25% from Sept 2024 Elevated troponin - suspect demand P:  Supportive care as above Trend troponins Monitor on tele Holding home diuretics, anti-HTN, amio for now Empiric milrinone after coox Hold GDMT while on pressors   Elevated LFTs- concern cardiogenic/hepatic congestion P:  F/u chem  Holding home statin, amio for now  Inotropes and vasopressors to maintain adequate perfusion  Coagulopathy -serial INR  7mm RML nodule. P: Repeat CT at 6 - 12 months  Acute encephalopathy -maintain adequate perfusion -avoid deliriogenic meds   Best practice (evaluated daily):   Diet/type: NPO DVT prophylaxis: prophylactic heparin  Pressure ulcer(s): pressure ulcer assessment deferred  GI prophylaxis: N/A Lines: N/A Foley:  Yes, and it is still needed Code Status:  full code Last date of multidisciplinary goals of care discussion: None yet.  Labs   CBC: Recent Labs  Lab 05/08/23 1155 05/08/23 1212  WBC 12.4*  --   NEUTROABS 11.0*  --   HGB 11.4* 13.6  HCT 36.8 40.0  MCV 104.8*  --   PLT 132*  --     Basic Metabolic Panel: Recent Labs  Lab 05/08/23 1155 05/08/23 1212  NA 135 136  K 4.9 4.8  CL 98 105  CO2 13*  --   GLUCOSE 291* 251*  BUN 99* 102*  CREATININE 4.71* 4.60*  CALCIUM 8.7*  --    GFR: Estimated Creatinine Clearance: 8.8 mL/min (A) (by C-G formula based on SCr of 4.6 mg/dL (H)). Recent Labs  Lab 05/08/23 1155  WBC 12.4*  LATICACIDVEN >9.0*    Liver Function Tests: Recent Labs  Lab 05/08/23 1155  AST PENDING  ALT 1,762*  ALKPHOS 205*  BILITOT 2.9*  PROT 5.7*  ALBUMIN 2.9*   No results for input(s): "LIPASE", "AMYLASE" in the last 168 hours. No results for input(s): "AMMONIA" in the last 168 hours.  ABG    Component Value Date/Time   PHART 7.382 10/27/2021 0906   PCO2ART 39.8 10/27/2021 0906   PO2ART 70 (L) 10/27/2021 0906   HCO3 12.2 (  L) 05/08/2023 1247   TCO2 16 (L) 05/08/2023 1212   ACIDBASEDEF 19.2 (H) 05/08/2023 1247   O2SAT 43.7 05/08/2023 1247     Coagulation Profile: Recent Labs  Lab 05/08/23 1155  INR 3.2*    Cardiac Enzymes: No results for input(s): "CKTOTAL", "CKMB", "CKMBINDEX", "TROPONINI" in the last 168 hours.  HbA1C: Hgb A1c MFr Bld  Date/Time Value Ref Range Status  03/09/2023 10:18 AM 6.0 (H) 4.8 - 5.6 % Final    Comment:             Prediabetes: 5.7 - 6.4          Diabetes: >6.4          Glycemic control for adults with diabetes: <7.0   05/26/2022 03:46 PM 6.5 (H) 4.8 - 5.6 % Final    Comment:             Prediabetes: 5.7 - 6.4          Diabetes: >6.4          Glycemic  control for adults with diabetes: <7.0     CBG: Recent Labs  Lab 05/08/23 1147 05/08/23 1200  GLUCAP <10* 121*    Review of Systems:   Unable to obtain due to encephalopathy  Past Medical History:  She,  has a past medical history of Anemia, Anxiety, Arthritis, COPD (chronic obstructive pulmonary disease) (HCC), CVA (cerebral vascular accident) (HCC), Depression, Diabetes mellitus without complication (HCC), Dry eyes (04/14/2014), GERD (gastroesophageal reflux disease), Glaucoma, Gout, HTN (hypertension), and OSA (obstructive sleep apnea).   Surgical History:   Past Surgical History:  Procedure Laterality Date   ABDOMINAL HYSTERECTOMY     BACK SURGERY     lumbar-disc   BIV PACEMAKER INSERTION CRT-P N/A 07/12/2022   Procedure: BIV PACEMAKER INSERTION CRT-P;  Surgeon: Duke Salvia, MD;  Location: [facility] INVASIVE CV LAB;  Service: Cardiovascular;  Laterality: N/A;   BUNIONECTOMY Bilateral    FOOT SURGERY Left    repair of "kissing Cousins"   JOINT REPLACEMENT Bilateral    hip    JOINT REPLACEMENT Right 2010 or 2012   knee   KNEE SURGERY     RIGHT/LEFT HEART CATH AND CORONARY ANGIOGRAPHY N/A 10/27/2021   Procedure: RIGHT/LEFT HEART CATH AND CORONARY ANGIOGRAPHY;  Surgeon: Corky Crafts, MD;  Location: [facility] INVASIVE CV LAB;  Service: Cardiovascular;  Laterality: N/A;   SHOULDER ARTHROSCOPY Right    shoulder surgery in [place] about 2000   SHOULDER HEMI-ARTHROPLASTY Left 03/25/2014   Procedure: LEFT SHOULDER HEMI-ARTHROPLASTY;  Surgeon: Vickki Hearing, MD;  Location: [facility] ORS;  Service: Orthopedics;  Laterality: Left;     Social History:   reports that she quit smoking about 38 years ago. Her smoking use included cigarettes. She started smoking about 78 years ago. She has a 40 pack-year smoking history. She has never used smokeless tobacco. She reports that she does not drink alcohol and does not use drugs.   Family History:  Her family history includes Aneurysm in her  father; Congestive Heart Failure in her niece; Diabetes in her brother; High blood pressure in her mother; Hypertension in her niece; Pancreatitis in her brother.   Allergies Allergies  Allergen Reactions   Lovastatin Swelling    Patient states that her tongue swells and she gets a tingling feeling all over. Patient states that her tongue swells and she gets a tingling feeling all over.   Lisinopril Swelling    Tongue swelling     Home Medications  Prior to Admission medications   Medication Sig Start Date End Date Taking? Authorizing Provider  albuterol ([name]) 108 (90 Base) MCG/ACT inhaler Inhale 2 puffs into the lungs every 6 (six) hours as needed for wheezing or shortness of breath. 01/12/23  Yes Glenford Bayley, NP  allopurinol (ZYLOPRIM) 300 MG tablet TAKE ONE-HALF TABLET BY MOUTH EVERY DAY 03/23/23  Yes [name], MD  amiodarone (PACERONE) 200 MG tablet Take 1 tablet (200 mg total) by mouth 2 (two) times daily for 14 days, THEN 1 tablet (200 mg total) daily. Patient taking differently: Patient takes 1 tablet by mouth daily. 12/20/22 01/03/24 Yes Laurey Morale, MD  atorvastatin (LIPITOR) 20 MG tablet Take 1 tablet (20 mg total) by mouth daily. 07/03/22 01/08/24 Yes Laurey Morale, MD  bisoprolol (ZEBETA) 5 MG tablet Take 1 tablet (5 mg total) by mouth daily. 11/27/22  Yes Laurey Morale, MD  Fluticasone-Umeclidin-Vilant (TRELEGY ELLIPTA) 200-62.5-25 MCG/ACT AEPB Inhale 1 puff into the lungs daily. 03/09/23  Yes [name], MD  furosemide (LASIX) 40 MG tablet Take 1 tablet (40 mg total) by mouth daily. 03/29/23 06/27/23 Yes Laurey Morale, MD  isosorbide mononitrate (IMDUR) 30 MG 24 hr tablet Take 0.5 tablets (15 mg total) by mouth daily. 10/18/22 03/11/24 Yes Alen Bleacher, NP  nitrofurantoin, macrocrystal-monohydrate, (MACROBID) 100 MG capsule Take 100 mg by mouth every 12 (twelve) hours. 05/02/23  Yes [provider]  venlafaxine XR (EFFEXOR-XR) 75 MG 24 hr  capsule Take 1 capsule (75 mg total) by mouth See admin instructions. Patient taking differently: Take 75 mg by mouth See admin instructions. Daily 12/28/22  Yes Fanny Dance, MD  zolpidem (AMBIEN) 5 MG tablet TAKE ONE TABLET BY MOUTH AT BEDTIME FOR SLEEP 04/17/23  Yes [name], MD  acetaminophen (TYLENOL) 500 MG tablet Take 500 mg by mouth every 6 (six) hours as needed for mild pain or moderate pain.    [provider]  aspirin (ASPIRIN CHILDRENS) 81 MG chewable tablet Chew 1 tablet (81 mg total) by mouth daily. 05/05/22   Mallipeddi, Vishnu P, MD  chlorpheniramine (CHLOR-TRIMETON) 4 MG tablet Take 4 mg by mouth daily as needed for allergies.    [provider]  Cholecalciferol (DIALYVITE VITAMIN D 5000 PO) Take 5,000 Units by mouth daily.    [provider]  empagliflozin (JARDIANCE) 10 MG TABS tablet Take 1 tablet (10 mg total) by mouth daily before breakfast. 03/22/23   Laurey Morale, MD  famotidine (PEPCID) 20 MG tablet Take 1 tablet (20 mg total) by mouth at bedtime. One after supper 10/10/21   Vassie Loll, MD  hydrALAZINE (APRESOLINE) 25 MG tablet Take 1 tablet (25 mg total) by mouth 3 (three) times daily. 03/12/23 03/06/24  Lee, [place], NP  HYDROcodone-acetaminophen (NORCO/VICODIN) 5-325 MG tablet Take 1 tablet by mouth every 6 (six) hours as needed for moderate pain (pain score 4-6). 02/17/23   Fanny Dance, MD  ketoconazole (NIZORAL) 2 % cream Apply 1 Application topically daily. 01/15/23   Anabel Halon, MD  metolazone (ZAROXOLYN) 2.5 MG tablet Take one tablet of metolazone by mouth today before your Lasix dose, then stop. 03/29/23   Laurey Morale, MD  Multiple Vitamins-Minerals (MULTIVITAMIN WITH MINERALS) tablet Take 1 tablet by mouth daily.    [provider]  Propylene Glycol (SYSTANE COMPLETE) 0.6 % SOLN Place 1 drop into both eyes 2 (two) times daily.    [provider]     Critical care  time: 45 min.    Steffanie Dunn,  DO 05/08/23 6:41 PM [facility]  For contact information, see Amion. If no response to pager, please call PCCM consult pager. After hours, 7PM- 7AM, please call Elink.

## 2023-05-08 NOTE — ED Provider Notes (Signed)
 [place] [facility] AT [facility] Provider Note   CSN: [id number] Arrival date & time: 05/08/23  1146     History  Chief Complaint  Patient presents with   Hypoglycemia    Heather [name] is a 83 y.o. female.  She has a history of heart failure, COPD, diabetes.  Last known well was yesterday.  Today family couldn't reach and EMS ultimately called found blood sugar critically low at 40.  Unable to obtain IV access gave glucagon IM.  Blood sugar unchanged.  Patient arrives unable to give any history.  Level 5 caveat.  No other history available at this time.  The history is provided by the EMS personnel.  Hypoglycemia Initial blood sugar:  42 Blood sugar after intervention:  35 Severity:  Severe Ineffective treatments:  Glucagon Associated symptoms: altered mental status, decreased responsiveness and shortness of breath        Home Medications Prior to Admission medications   Medication Sig Start Date End Date Taking? Authorizing Provider  acetaminophen (TYLENOL) 500 MG tablet Take 500 mg by mouth every 6 (six) hours as needed for mild pain or moderate pain.    [provider]  albuterol ([name]) 108 (90 Base) MCG/ACT inhaler Inhale 2 puffs into the lungs every 6 (six) hours as needed for wheezing or shortness of breath. 01/12/23   Glenford Bayley, NP  allopurinol (ZYLOPRIM) 300 MG tablet TAKE ONE-HALF TABLET BY MOUTH EVERY DAY 03/23/23   [name], MD  amiodarone (PACERONE) 200 MG tablet Take 1 tablet (200 mg total) by mouth 2 (two) times daily for 14 days, THEN 1 tablet (200 mg total) daily. Patient taking differently: Patient takes 1 tablet by mouth daily. 12/20/22 01/03/24  Laurey Morale, MD  aspirin (ASPIRIN CHILDRENS) 81 MG chewable tablet Chew 1 tablet (81 mg total) by mouth daily. 05/05/22   Mallipeddi, Vishnu P, MD  atorvastatin (LIPITOR) 20 MG tablet Take 1 tablet (20 mg total) by mouth daily. 07/03/22 01/08/24  Laurey Morale,  MD  bisoprolol (ZEBETA) 5 MG tablet Take 1 tablet (5 mg total) by mouth daily. 11/27/22   Laurey Morale, MD  chlorpheniramine (CHLOR-TRIMETON) 4 MG tablet Take 4 mg by mouth daily as needed for allergies.    [provider]  Cholecalciferol (DIALYVITE VITAMIN D 5000 PO) Take 5,000 Units by mouth daily.    [provider]  empagliflozin (JARDIANCE) 10 MG TABS tablet Take 1 tablet (10 mg total) by mouth daily before breakfast. 03/22/23   Laurey Morale, MD  famotidine (PEPCID) 20 MG tablet Take 1 tablet (20 mg total) by mouth at bedtime. One after supper 10/10/21   Vassie Loll, MD  Fluticasone-Umeclidin-Vilant (TRELEGY ELLIPTA) 200-62.5-25 MCG/ACT AEPB Inhale 1 puff into the lungs daily. 03/09/23   [name], MD  furosemide (LASIX) 40 MG tablet Take 1 tablet (40 mg total) by mouth daily. 03/29/23 06/27/23  Laurey Morale, MD  hydrALAZINE (APRESOLINE) 25 MG tablet Take 1 tablet (25 mg total) by mouth 3 (three) times daily. 03/12/23 03/06/24  Lee, [place], NP  HYDROcodone-acetaminophen (NORCO/VICODIN) 5-325 MG tablet Take 1 tablet by mouth every 6 (six) hours as needed for moderate pain (pain score 4-6). 02/17/23   Fanny Dance, MD  isosorbide mononitrate (IMDUR) 30 MG 24 hr tablet Take 0.5 tablets (15 mg total) by mouth daily. 10/18/22 03/11/24  Alen Bleacher, NP  ketoconazole (NIZORAL) 2 % cream Apply 1 Application topically daily. 01/15/23   Allena Katz,  Earlie Lou, MD  metolazone (ZAROXOLYN) 2.5 MG tablet Take one tablet of metolazone by mouth today before your Lasix dose, then stop. 03/29/23   Laurey Morale, MD  Multiple Vitamins-Minerals (MULTIVITAMIN WITH MINERALS) tablet Take 1 tablet by mouth daily.    [provider]  Propylene Glycol (SYSTANE COMPLETE) 0.6 % SOLN Place 1 drop into both eyes 2 (two) times daily.    [provider]  venlafaxine XR (EFFEXOR-XR) 75 MG 24 hr capsule Take 1 capsule (75 mg total) by mouth See admin instructions. Patient taking  differently: Take 75 mg by mouth See admin instructions. Daily 12/28/22   Fanny Dance, MD  zolpidem (AMBIEN) 5 MG tablet TAKE ONE TABLET BY MOUTH AT BEDTIME FOR SLEEP 04/17/23   [name], MD      Allergies    Lovastatin and Lisinopril    Review of Systems   Review of Systems  Constitutional:  Positive for decreased responsiveness.  Respiratory:  Positive for shortness of breath.     Physical Exam Updated Vital Signs BP 98/60   Pulse 60   Temp 99 F (37.2 C) (Rectal)   Resp 20   Ht 5\' 2"  (1.575 m)   Wt 69.4 kg   SpO2 97%   BMI 27.98 kg/m  Physical Exam Vitals and nursing note reviewed.  Constitutional:      General: She is in acute distress.     Appearance: She is well-developed. She is ill-appearing.  HENT:     Head: Normocephalic and atraumatic.  Eyes:     Conjunctiva/sclera: Conjunctivae normal.  Cardiovascular:     Rate and Rhythm: Regular rhythm. Tachycardia present.     Heart sounds: No murmur heard. Pulmonary:     Effort: Tachypnea, accessory muscle usage and respiratory distress present.     Breath sounds: Normal breath sounds.  Abdominal:     Palpations: Abdomen is soft.     Tenderness: There is no abdominal tenderness. There is no guarding or rebound.  Musculoskeletal:        General: No deformity. Normal range of motion.     Cervical back: Neck supple.  Skin:    General: Skin is warm and dry.     Capillary Refill: Capillary refill takes less than 2 seconds.  Neurological:     Comments: 'eyes are open but she is not responding to verbal stimuli not following commands.     ED Results / Procedures / Treatments   Labs (all labs ordered are listed, but only abnormal results are displayed) Labs Reviewed  LACTIC ACID, PLASMA - Abnormal; Notable for the following components:      Result Value   Lactic Acid, Venous >9.0 (*)    All other components within normal limits  LACTIC ACID, PLASMA - Abnormal; Notable for the following components:    Lactic Acid, Venous >9.0 (*)    All other components within normal limits  COMPREHENSIVE METABOLIC PANEL - Abnormal; Notable for the following components:   CO2 13 (*)    Glucose, Bld 291 (*)    BUN 99 (*)    Creatinine, Ser 4.71 (*)    Calcium 8.7 (*)    Total Protein 5.7 (*)    Albumin 2.9 (*)    AST 2,396 (*)    ALT 1,762 (*)    Alkaline Phosphatase 205 (*)    Total Bilirubin 2.9 (*)    GFR, Estimated 9 (*)    Anion gap 24 (*)    All other components  within normal limits  CBC WITH DIFFERENTIAL/PLATELET - Abnormal; Notable for the following components:   WBC 12.4 (*)    RBC 3.51 (*)    Hemoglobin 11.4 (*)    MCV 104.8 (*)    RDW 16.0 (*)    Platelets 132 (*)    nRBC 1.2 (*)    Neutro Abs 11.0 (*)    Lymphs Abs 0.6 (*)    Abs Immature Granulocytes 0.15 (*)    All other components within normal limits  PROTIME-INR - Abnormal; Notable for the following components:   Prothrombin Time 32.6 (*)    INR 3.2 (*)    All other components within normal limits  URINALYSIS, W/ REFLEX TO CULTURE (INFECTION SUSPECTED) - Abnormal; Notable for the following components:   Color, Urine AMBER (*)    APPearance HAZY (*)    Glucose, UA 50 (*)    Protein, ur 100 (*)    All other components within normal limits  BLOOD GAS, VENOUS - Abnormal; Notable for the following components:   pH, Ven 6.97 (*)    Bicarbonate 12.2 (*)    Acid-base deficit 19.2 (*)    All other components within normal limits  CK - Abnormal; Notable for the following components:   Total CK 345 (*)    All other components within normal limits  LACTIC ACID, PLASMA - Abnormal; Notable for the following components:   Lactic Acid, Venous >9.0 (*)    All other components within normal limits  I-STAT CHEM 8, ED - Abnormal; Notable for the following components:   BUN 102 (*)    Creatinine, Ser 4.60 (*)    Glucose, Bld 251 (*)    Calcium, Ion 1.03 (*)    TCO2 16 (*)    All other components within normal limits  CBG  MONITORING, ED - Abnormal; Notable for the following components:   Glucose-Capillary 121 (*)    All other components within normal limits  CBG MONITORING, ED - Abnormal; Notable for the following components:   Glucose-Capillary <10 (*)    All other components within normal limits  TROPONIN I (HIGH SENSITIVITY) - Abnormal; Notable for the following components:   Troponin I (High Sensitivity) 245 (*)    All other components within normal limits  TROPONIN I (HIGH SENSITIVITY) - Abnormal; Notable for the following components:   Troponin I (High Sensitivity) 258 (*)    All other components within normal limits  CULTURE, BLOOD (ROUTINE X 2)  CULTURE, BLOOD (ROUTINE X 2)  RESP PANEL BY RT-PCR (RSV, FLU A&B, COVID)  RVPGX2  APTT  LACTIC ACID, PLASMA  CBG MONITORING, ED    EKG EKG Interpretation Date/Time:  Tuesday May 08 2023 12:07:28 EST Ventricular Rate:  60 PR Interval:  145 QRS Duration:  194 QT Interval:  600 QTC Calculation: 600 R Axis:   270  Text Interpretation: Atrial-ventricular dual-paced rhythm No further analysis attempted due to paced rhythm No significant change since last tracing Confirmed by Meridee Score [phone]) on 05/08/2023 12:21:40 PM  Radiology CT CHEST ABDOMEN PELVIS WO CONTRAST Result Date: 05/08/2023 CLINICAL DATA:  Sepsis, hypoglycemia, altered mental status EXAM: CT CHEST, ABDOMEN AND PELVIS WITHOUT CONTRAST TECHNIQUE: Multidetector CT imaging of the chest, abdomen and pelvis was performed following the standard protocol without IV contrast. RADIATION DOSE REDUCTION: This exam was performed according to the departmental dose-optimization program which includes automated exposure control, adjustment of the mA and/or kV according to patient size and/or use of iterative reconstruction technique. COMPARISON:  08/27/2001 by report only FINDINGS: CT CHEST FINDINGS Cardiovascular: Left subclavian transvenous pacemaker. Cardiomegaly with biventricular enlargement. No  pericardial effusion. Coronary and aortic calcifications. Central great vessels normal in caliber. Mediastinum/Nodes: No hematoma, mass, or adenopathy. Lungs/Pleura: No significant pleural effusion. No pneumothorax. Dependent atelectasis/consolidation in both lower lobes. 7 mm nodule, lateral right middle lobe (Im90,Se3) . Musculoskeletal: Post left shoulder arthroplasty. Right shoulder DJD. CT ABDOMEN PELVIS FINDINGS Hepatobiliary: Hyperdense contents of the nondilated gallbladder. No focal liver lesion or biliary ductal dilatation. Pancreas: Unremarkable. No pancreatic ductal dilatation or surrounding inflammatory changes. Spleen: Normal in size without focal abnormality. Adrenals/Urinary Tract: No adrenal mass. No urolithiasis or hydronephrosis. Right kidney is ptotic. Hyperdense 1.1 cm left upper pole and 1.3 cm right mid pole renal lesions, possibly hyperdense cysts but nonspecific. Urinary bladder is nondilated, significant image degradation secondary to streak artifact from hip hardware. Stomach/Bowel: Stomach is nondilated. Small bowel decompressed. The colon is incompletely distended, without acute finding. Vascular/Lymphatic: Heavy aortoiliac calcified plaque without aneurysm. Infrarenal TrapEase IVC filter. No abdominal or pelvic adenopathy localized. Reproductive: Limited assessment due to significant streak artifact from hip hardware. Other: Small volume presacral fluid. No abdominal ascites. No free air. Musculoskeletal: Right spigelian hernia containing only fat. Advanced lower lumbar facet DJD, likely accounting for the grade 1 anterolisthesis L5-S1. Bilateral hip arthroplasty hardware resulting in significant regional streak artifact. IMPRESSION: 1. Dependent atelectasis/consolidation in both lower lobes. 2. Coronary and Aortic Atherosclerosis (ICD10-170.0). 3. 7 mm right middle lobe nodule. Non-contrast chest CT at 6-12 months is recommended. If the nodule is stable at time of repeat CT, then  future CT at 18-24 months (from today's scan) is considered optional for low-risk patients, but is recommended for high-risk patients. This recommendation follows the consensus statement: Guidelines for Management of Incidental Pulmonary Nodules Detected on CT Images: From the Fleischner Society 2017; Radiology 2017; 284:228-243. 4. Right Spigelian hernia containing only fat. Electronically Signed   By: Corlis Leak M.D.   On: 05/08/2023 14:57   DG Chest Port 1 View Result Date: 05/08/2023 CLINICAL DATA:  Questionable sepsis - evaluate for abnormality EXAM: PORTABLE CHEST - 1 VIEW COMPARISON:  07/12/2022 FINDINGS: Some increase in atelectasis or infiltrate at the left lung base. Right lung clear. Heart size upper limits normal. Aortic Atherosclerosis (ICD10-170.0). Stable left subclavian transvenous pacemaker. Persistent blunting of the left lateral costophrenic angle. Post left shoulder arthroplasty. IMPRESSION: Increasing left basilar atelectasis or infiltrate. Electronically Signed   By: Corlis Leak M.D.   On: 05/08/2023 14:46   CT Head Wo Contrast Result Date: 05/08/2023 CLINICAL DATA:  Mental status change of unknown cause. Hypoglycemia. EXAM: CT HEAD WITHOUT CONTRAST TECHNIQUE: Contiguous axial images were obtained from the base of the skull through the vertex without intravenous contrast. RADIATION DOSE REDUCTION: This exam was performed according to the departmental dose-optimization program which includes automated exposure control, adjustment of the mA and/or kV according to patient size and/or use of iterative reconstruction technique. COMPARISON:  09/10/2021 FINDINGS: Brain: Age related volume loss. Mild chronic small-vessel ischemic change of the white matter. No evidence of acute infarction, mass lesion, hemorrhage, hydrocephalus or extra-axial collection. Vascular: There is atherosclerotic calcification of the major vessels at the base of the brain. Skull: Negative Sinuses/Orbits: Clear/normal Other:  None IMPRESSION: No acute CT finding. Age related volume loss. Mild chronic small-vessel ischemic change of the white matter. Electronically Signed   By: Paulina Fusi M.D.   On: 05/08/2023 14:29    Procedures .Critical Care  Performed by: Meridee Score  C, MD Authorized by: Terrilee Files, MD   Critical care provider statement:    Critical care time (minutes):  45   Critical care time was exclusive of:  Separately billable procedures and treating other patients   Critical care was necessary to treat or prevent imminent or life-threatening deterioration of the following conditions:  Sepsis, shock, CNS failure or compromise, metabolic crisis, renal failure and respiratory failure   Critical care was time spent personally by me on the following activities:  Development of treatment plan with patient or surrogate, discussions with consultants, evaluation of patient's response to treatment, examination of patient, obtaining history from patient or surrogate, ordering and performing treatments and interventions, ordering and review of laboratory studies, ordering and review of radiographic studies, pulse oximetry and re-evaluation of patient's condition   I assumed direction of critical care for this patient from another provider in my specialty: no       Medications Ordered in ED Medications  lactated ringers infusion ( Intravenous New Bag/Given 05/08/23 1400)  norepinephrine (LEVOPHED) 4mg  in (0.016 mg/mL) premix infusion (has no administration in time range)  sodium bicarbonate injection 100 mEq (has no administration in time range)  dextrose 50 % solution 50 mL (50 mLs Intravenous Given 05/08/23 1151)  lactated ringers bolus 1,000 mL (0 mLs Intravenous Stopped 05/08/23 1400)  acetaminophen (TYLENOL) suppository 650 mg (650 mg Rectal Given 05/08/23 1211)  lactated ringers bolus 1,100 mL (0 mLs Intravenous Stopped 05/08/23 1351)  ceFEPIme (MAXIPIME) 2 g in sodium chloride 0.9 % 100 mL IVPB (0 g  Intravenous Stopped 05/08/23 1327)  metroNIDAZOLE (FLAGYL) IVPB 500 mg (0 mg Intravenous Stopped 05/08/23 1400)  vancomycin (VANCOREADY) IVPB 1250 mg/250 mL (0 mg Intravenous Stopped 05/08/23 1543)    ED Course/ Medical Decision Making/ A&P Clinical Course as of 05/08/23 1702  Tue May 08, 2023  1207 Repeat blood sugar after D50 is 120s.  She is a little more alert.  Found to be febrile to 101.6 rectal.  Continuing sepsis workup. [MB]  1313 Son is here.  He said he would be the medical decision maker.  He said that she is full code at present.  I tried to explain the critical nature of her condition at this time.  He understands she likely will be transferred to [facility] for further management. [MB]  1316 Discussed with Dr. Denese Killings critical care at [facility].  He will accept the patient for transfer. [MB]    Clinical Course User Index [MB] Terrilee Files, MD                                 Medical Decision Making Amount and/or Complexity of Data Reviewed Labs: ordered. Radiology: ordered.  Risk OTC drugs. Prescription drug management. Decision regarding hospitalization.   This patient complains of low blood sugar altered mental status; this involves an extensive number of treatment Options and is a complaint that carries with it a high risk of complications and morbidity. The differential includes hypoglycemia, sepsis, shock, stroke, bleed, ischemic gut,  I ordered, reviewed and interpreted labs, which included CBC with elevated white count, stable low hemoglobin, low platelets, chemistries with low bicarb elevated BUN and creatinine elevated LFTs, INR elevated, lactate significantly elevated, VBG with acidosis, troponins elevated and rising, COVID and flu negative, blood culture sent I ordered medication IV fluids IV antibiotics, rectal Tylenol and reviewed PMP when indicated. I ordered imaging studies which included chest  x-ray, CT head chest abdomen and pelvis and I independently     visualized and interpreted imaging which showed possible early infiltrate Additional history obtained from EMS and patient's family Previous records obtained and reviewed in epic, recent outpatient cardiology and family medicine visits I consulted critical care Dr. Denese Killings and discussed lab and imaging findings and discussed disposition.  Cardiac monitoring reviewed, paced rhythm Social determinants considered, stress Critical Interventions: Initiation of sepsis protocol with goal-directed therapy of early antibiotics and aggressive fluids.  After the interventions stated above, I reevaluated the patient and found patient still to be critically ill Admission and further testing considered, she would benefit from transfer to [facility] for continued ICU management.  Family updated on plan and in agreement.         Final Clinical Impression(s) / ED Diagnoses Final diagnoses:  Acute sepsis (HCC)  AKI (acute kidney injury) (HCC)  Elevated lactic acid level  Hypoglycemia    Rx / DC Orders ED Discharge Orders     None         Terrilee Files, MD 05/08/23 1708

## 2023-05-08 NOTE — Sepsis Progress Note (Signed)
 A third lactic acid is already ordered

## 2023-05-09 ENCOUNTER — Inpatient Hospital Stay (HOSPITAL_COMMUNITY)

## 2023-05-09 DIAGNOSIS — R579 Shock, unspecified: Secondary | ICD-10-CM

## 2023-05-09 DIAGNOSIS — A419 Sepsis, unspecified organism: Secondary | ICD-10-CM | POA: Diagnosis not present

## 2023-05-09 DIAGNOSIS — J181 Lobar pneumonia, unspecified organism: Secondary | ICD-10-CM

## 2023-05-09 DIAGNOSIS — I5023 Acute on chronic systolic (congestive) heart failure: Secondary | ICD-10-CM

## 2023-05-09 DIAGNOSIS — R570 Cardiogenic shock: Secondary | ICD-10-CM | POA: Diagnosis not present

## 2023-05-09 DIAGNOSIS — J9601 Acute respiratory failure with hypoxia: Secondary | ICD-10-CM

## 2023-05-09 DIAGNOSIS — K7200 Acute and subacute hepatic failure without coma: Secondary | ICD-10-CM

## 2023-05-09 DIAGNOSIS — E162 Hypoglycemia, unspecified: Secondary | ICD-10-CM

## 2023-05-09 DIAGNOSIS — G9341 Metabolic encephalopathy: Secondary | ICD-10-CM

## 2023-05-09 DIAGNOSIS — D696 Thrombocytopenia, unspecified: Secondary | ICD-10-CM

## 2023-05-09 DIAGNOSIS — R911 Solitary pulmonary nodule: Secondary | ICD-10-CM

## 2023-05-09 DIAGNOSIS — R1310 Dysphagia, unspecified: Secondary | ICD-10-CM

## 2023-05-09 LAB — GLUCOSE, CAPILLARY
Glucose-Capillary: 93 mg/dL (ref 70–99)
Glucose-Capillary: 147 mg/dL — ABNORMAL HIGH (ref 70–99)
Glucose-Capillary: 125 mg/dL — ABNORMAL HIGH (ref 70–99)
Glucose-Capillary: 116 mg/dL — ABNORMAL HIGH (ref 70–99)
Glucose-Capillary: 124 mg/dL — ABNORMAL HIGH (ref 70–99)
Glucose-Capillary: 93 mg/dL (ref 70–99)

## 2023-05-09 LAB — CBC
HCT: 28.6 % — ABNORMAL LOW (ref 36.0–46.0)
Hemoglobin: 9.7 g/dL — ABNORMAL LOW (ref 12.0–15.0)
MCH: 32.2 pg (ref 26.0–34.0)
MCHC: 33.9 g/dL (ref 30.0–36.0)
MCV: 95 fL (ref 80.0–100.0)
Platelets: 82 10*3/uL — ABNORMAL LOW (ref 150–400)
RBC: 3.01 MIL/uL — ABNORMAL LOW (ref 3.87–5.11)
RDW: 15.3 % (ref 11.5–15.5)
WBC: 12.1 10*3/uL — ABNORMAL HIGH (ref 4.0–10.5)
nRBC: 1.5 % — ABNORMAL HIGH (ref 0.0–0.2)

## 2023-05-09 LAB — COOXEMETRY PANEL
Carboxyhemoglobin: 1.4 % (ref 0.5–1.5)
Methemoglobin: <0.7 % (ref 0.0–1.5)
O2 Saturation: 67.9 %
Total hemoglobin: 10.1 g/dL — ABNORMAL LOW (ref 12.0–16.0)

## 2023-05-09 LAB — BASIC METABOLIC PANEL
Anion gap: 17 — ABNORMAL HIGH (ref 5–15)
BUN: 119 mg/dL — ABNORMAL HIGH (ref 8–23)
CO2: 22 mmol/L (ref 22–32)
Calcium: 8 mg/dL — ABNORMAL LOW (ref 8.9–10.3)
Chloride: 101 mmol/L (ref 98–111)
Creatinine, Ser: 4.84 mg/dL — ABNORMAL HIGH (ref 0.44–1.00)
GFR, Estimated: 9 mL/min — ABNORMAL LOW (ref >60)
Glucose, Bld: 149 mg/dL — ABNORMAL HIGH (ref 70–99)
Potassium: 4 mmol/L (ref 3.5–5.1)
Sodium: 140 mmol/L (ref 135–145)

## 2023-05-09 LAB — HEPATIC FUNCTION PANEL
ALT: 2675 U/L — ABNORMAL HIGH (ref 0–44)
AST: 4931 U/L — ABNORMAL HIGH (ref 15–41)
Albumin: 2.7 g/dL — ABNORMAL LOW (ref 3.5–5.0)
Alkaline Phosphatase: 163 U/L — ABNORMAL HIGH (ref 38–126)
Bilirubin, Direct: 2.1 mg/dL — ABNORMAL HIGH (ref 0.0–0.2)
Indirect Bilirubin: 1.6 mg/dL — ABNORMAL HIGH (ref 0.3–0.9)
Total Bilirubin: 3.7 mg/dL — ABNORMAL HIGH (ref 0.0–1.2)
Total Protein: 4.6 g/dL — ABNORMAL LOW (ref 6.5–8.1)

## 2023-05-09 LAB — MAGNESIUM: Magnesium: 2.3 mg/dL (ref 1.7–2.4)

## 2023-05-09 LAB — PROTIME-INR
INR: 4.8 (ref 0.8–1.2)
Prothrombin Time: 45.3 s — ABNORMAL HIGH (ref 11.4–15.2)

## 2023-05-09 LAB — ECHOCARDIOGRAM COMPLETE
AR max vel: 1.51 cm2
AV Area VTI: 1.5 cm2
AV Area mean vel: 1.37 cm2
AV Mean grad: 8 mmHg
AV Peak grad: 12.7 mmHg
Ao pk vel: 1.78 m/s
Area-P 1/2: 1.98 cm2
Est EF: 25
Height: 62 in
MV VTI: 1.23 cm2
S' Lateral: 5.1 cm
Single Plane A4C EF: 26.1 %
Weight: 2596.14 [oz_av]

## 2023-05-09 LAB — PHOSPHORUS: Phosphorus: 7.5 mg/dL — ABNORMAL HIGH (ref 2.5–4.6)

## 2023-05-09 LAB — LACTIC ACID, PLASMA: Lactic Acid, Venous: 3 mmol/L (ref 0.5–1.9)

## 2023-05-09 LAB — VANCOMYCIN, RANDOM: Vancomycin Rm: 16 ug/mL

## 2023-05-09 MED ORDER — ENSURE ENLIVE PO LIQD
237.0000 mL | Freq: Two times a day (BID) | ORAL | Status: DC
  Administered 2023-05-09: 237 mL via ORAL

## 2023-05-09 MED ORDER — VITAMIN K1 10 MG/ML IJ SOLN
1.0000 mg | Freq: Once | INTRAVENOUS | Status: AC
  Administered 2023-05-09: 1 mg via INTRAVENOUS
  Filled 2023-05-09: qty 0.1

## 2023-05-09 MED ORDER — MIDODRINE HCL 5 MG PO TABS: 5.0000 mg | ORAL_TABLET | Freq: Three times a day (TID) | ORAL | Status: DC

## 2023-05-09 MED ORDER — WHITE PETROLATUM EX OINT
TOPICAL_OINTMENT | CUTANEOUS | Status: DC | PRN
  Filled 2023-05-09: qty 28.35

## 2023-05-09 MED ORDER — SODIUM CHLORIDE 0.9 % IV SOLN
2.0000 g | INTRAVENOUS | Status: AC
  Administered 2023-05-09 – 2023-05-14 (×6): 2 g via INTRAVENOUS
  Filled 2023-05-09 (×6): qty 20

## 2023-05-09 MED ORDER — ORAL CARE MOUTH RINSE: 15.0000 mL | OROMUCOSAL | Status: DC | PRN

## 2023-05-09 MED ORDER — PERFLUTREN LIPID MICROSPHERE
1.0000 mL | INTRAVENOUS | Status: AC | PRN
  Administered 2023-05-09: 2 mL via INTRAVENOUS

## 2023-05-09 MED ORDER — FAMOTIDINE 20 MG PO TABS
10.0000 mg | ORAL_TABLET | Freq: Every day | ORAL | Status: DC
  Administered 2023-05-09: 10 mg via ORAL
  Filled 2023-05-09 (×3): qty 1

## 2023-05-09 NOTE — Progress Notes (Signed)
 Heather [name] is an 83 y/o woman with a history of HFrEF who presented with confusion and shock from [facility]. Overnight no acute events. This morning she is more awake but still not back to baseline per family at bedside.   BP (!) 105/45   Pulse 60   Temp 97.9 F (36.6 C)   Resp (!) 24   Ht 5\' 2"  (1.575 m)   Wt 73.6 kg   SpO2 94%   BMI 29.68 kg/m  Chronically ill-appearing woman lying in bed no acute distress [place]/AT, eyes anicteric Right IJ CVC Breathing comfortably on nasal cannula, faint rales, no wheezing S1-S2, regular rate rhythm Abdomen soft, nontender Mild lower extremity edema, extremities warm Skin warm, dry, no diffuse rashes   Coox 87% BUN 119 Cr 4.84 AST 4931 ALT 2675 T bili 3.7 LA 3 WBC 12.1 H/H 9.7/28.6 Platelets 82  Blood cultures: NGTD RVP negative  Echo: LVEF 25%, severely dilated LV, G1DD. Severely dilated atria. Normal RV function. Normal IVC with normal respiratory variability. Moderate TR.  Assessment & plan:  Shock- likely mixed cardiogenic and septic Lactic acidosis -pressors & inotropes to maintain MAP >65 -serial coox -hold on diuresis for now  Acute on chronic HFrEF w/ cardiogenic shock; troponin elevation due to acute illness Frequent PVCs -con't milrinone -con't NE to maintain MAP >65 -serial coox, CVP -hold Bblcokers, diuretics, GDMT for now due to hypotension -hold additional IVF (CVP 14), and hold diuretics now with likely sepsis associated ATN. Likely can start diuresis tomorrow.  -con't amioarone  Septic shock, bilateral lower lobe pneumonia -con't broad antibiotics; ok to deescalate to vanc. Switch to ceftriaxone & doxycyline. Plan for 7 days. -vasopressors and inotropes  Severe metabolic acidosis, lactic acidosis -stop bicarb gtt -Daily BMP  Acute respiratory failure with hypoxia; off O2 today -COPD -bronchodilators  Shock liver -maintain adequate perfusion, likely needs diuresis eventually  AKI w/  oliguria hyperphosphatemia -maintain adequate perfusion -strict I/O  Acute metabolic encephalopathy due to sepsis and hypoglycemia -PT, OT -delirium precautions -Frequent reorientation -Check ammonia level with tomorrow's morning labs  Coagulopathy, s/p vit K -monitor for the next 2 days  Thrombocytopenia -no current need for transfusion  Hypoglycemia, likely from AKI with home meds- resolved H/o DM2 -hold PTA meds -hold SSI> throughout the day blood sugars remained only minimally elevated.  P.o. intake still remains poor  Dysphagia -Dysphagia diet  RML 7mm nodule -needs OP follow up  Patient updated with sister at bedside during rounds.  This patient is critically ill with multiple organ system failure which requires frequent high complexity decision making, assessment, support, evaluation, and titration of therapies. This was completed through the application of advanced monitoring technologies and extensive interpretation of multiple databases. During this encounter critical care time was devoted to patient care services described in this note for 43 minutes.  Steffanie Dunn, DO 05/09/23 8:14 PM [facility]  For contact information, see Amion. If no response to pager, please call PCCM consult pager. After hours, 7PM- 7AM, please call [facility].

## 2023-05-09 NOTE — Progress Notes (Addendum)
 eLink Physician-Brief Progress Note Patient Name: Heather [name] DOB: [birth year] MRN: [id number]   Date of Service  05/09/2023  HPI/Events of Note  INR 4.8 No report of active bleed CBC pending  eICU Interventions  Ordered Vitamin K 1 mg for non urgent reversal Discussed with BSRN     Intervention Category Intermediate Interventions: Other:  Darl Pikes 05/09/2023, 5:36 AM

## 2023-05-09 NOTE — Progress Notes (Signed)
 NAME:  Heather [name], MRN:  [id number], DOB:  Nov 18, 1940, LOS: 1 ADMISSION DATE:  05/08/2023, CONSULTATION DATE:  05/08/2023 REFERRING MD:  Charm Barges , CHIEF COMPLAINT:  Weakness, AMS   History of Present Illness:  Heather [name] is a 83 y.o. female who has a PMH as below. Police was called 3/4 for a wellness visit and upon their arrival, she was found to have AMS. She was last known well the day prior. EMS was called and upon their arrival, CBG was 40 and she was given IM glucagon before being transported to [facility] ED.    In ED, initial CBG too low to pickup. ABG with profound metabolic acidosis (6.97/54/42). Workup also noteable for AKI with SCr 4.71/BUN 99, AG 24, ALT 1762, trop 245, lactate > 9, WBC 12.4. Tmax 100.5 rectally, BP and HR normal. UA unremarkable. CT head and CT chest/abd/pelv pending.   Due to her multiple metabolic derangements, PCCM called for transfer to [facility] ICU.   Of note, cMRI in Jan 2024 showed LVEF 16%, mild apical inferolateral subendocardial LGE, mod dilated RV with RVEF 23%. Echo from April 2024 showed EF < 20% with mod LV enlargement, septal lateral dyssynchrony, mod RV dysfunction, PASP 65. She was referred to EP and underwent St Jude CRT-P placement 5/24. Echo 9/24, EF 20-25%, mild LV dilation, moderate RV dysfunction with normal size. On arrival at [facility] BG was 99.  Pertinent  Medical History  has Rotator cuff arthropathy; Renal insufficiency; Essential hypertension; Gout; Long-term current use of opiate analgesic; Bilateral carpal tunnel syndrome; Chronic low back pain; Lumbosacral radiculopathy; Polyarthropathy; Sacroiliac joint pain; Preventative health care; Hyperlipidemia; Anxiety; COPD GOLD 1 ; Gastroesophageal reflux disease; Intertrigo; AKI (acute kidney injury) (HCC); Prediabetes; Leg edema; DOE (dyspnea on exertion); Chronic kidney disease, stage IV (severe) (HCC); Tremor; Acute kidney injury (HCC); Leg swelling; SOBOE (shortness of breath on exertion); Hyperkalemia;  Acute systolic CHF (congestive heart failure) (HCC); History of cerebellar stroke; OSA on CPAP; Bloating; Acute on chronic systolic CHF (congestive heart failure) (HCC); Prolonged QT interval; Chronic systolic HF (heart failure) (HCC); LVF (left ventricular failure) (HCC); 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   Significant Hospital Events: Including procedures, antibiotic start and stop dates in addition to other pertinent events   3/4 Admitted to PCCM, Started on IV cefepime/van/doxy. Started on IV Milrinone and IV Bicarb. ECHO ordered  Interim History / Subjective:  Overnight, INR 4.8, Vitamin K 1 mg for non urgent reversal   Initially lethargic AM Re-evaluated this afternoon, reports she is doing well.  She wanted a Klenex to wipe her nose Oriented to self only and person (sister at bedside)  Objective   Blood pressure (!) 95/50, pulse (!) 59, temperature 98.6 F (37 C), resp. rate 16, height 5\' 2"  (1.575 m), weight 73.6 kg, SpO2 93%. CVP:  [13 mmHg-16 mmHg] 13 mmHg      Intake/Output Summary (Last 24 hours) at 05/09/2023 0615 Last data filed at 05/09/2023 0500 Gross per 24 hour  Intake 2567.63 ml  Output 24 ml  Net 2543.63 ml   Filed Weights   05/08/23 1156 05/08/23 1800 05/09/23 0500  Weight: 69.4 kg 72.6 kg 73.6 kg    Examination: General: Appears ill, laying in bed no acute distress  Neuro: Alert and oriented self. Speaking in few words, answering few questions, says "I am well", still appears to be confused. Sister, Lurena Joiner at beside HENT: [place]/AT.  Lungs: +course crackles, no wheezing Cardiovascular: RR Abdomen: Tender to palpation upper > lower  Extremities:  Warm, LE edema 1-2+, dp pulses intact  GU: not assessed   Labs Pertinent BMP: Na 140, K 4.0 Bicarb 22, BUN 119, Cr 4.84, AG 17  LFTS: AST 4931, ALT 2675, ALP 163, T bili 3.7 P: 7.5  CBC: WBC 12.1, Hgb 9.7, PLT 82  - Co-ox = O2 67.9 ( 61.4)  - CVP (14)  - BNP >4500 (chronic)  - Trop 245 -> 258  - Lactic Acid: 3.0 (9.0)  - Blood Cultures 3/4 so far no growth   - MRSA negative   Pertinent Imaging: CTH : No acute CT finding. Age related volume loss. Mild chronic small-vessel ischemic change of the white matter CT CAP: Dependent atelectasis/consolidation in both lower lobes. 7 mm right middle lobe nodule. Right Spigelian hernia containing only fat.  CXR: Cardiomegaly. Worsening airspace disease at the left > right lung base which may be due to atelectasis or pneumonia. Possible small left effusion. ECHO today: Global hypokinesis worse apex and septum with akinesis. LVEF 25% with regional wall motion abnormalities. Severely enlarged LV. G I DD. RV function normal.   Resolved Hospital Problem list   N/A    Assessment & Plan:  #Shock Likely Combination of Sepsis and cardiogenic Presented with severe acidosis, elevated LFTs, Troponin. Imaging findings showed L>R increased opacities concerning for consolidation/pleural effusion. These findings are consistent with sepsis. In addition, co-ox 58.1 and CVP 16, with underlying biventricular HF, NICM EF 20-25%, suggesting cardiogenic etiology.  - Levophed for MAP > 65  - IV Milrinone to increase peripheral perfusion and cardiac contractility  - MRSA swab negative, stop vancomycin  - De-escalate abx to IV Doxy and Rocephin  - Blood Cultures 3/4 so far no growth   Acute hypoxic respiratory failure  Community Acquired Pneumonia Hx of COPD Imaging findings showed lower lobes opacities concerning consolidation.  - Procalcitonin 2.21   - ABX IV Doxy 2/7 and Rocephin 1/6  - Bronchodilators with Budesonide/Brovana/Yupelri, takes Trelegy at home -  Supplemental O2 as needed to keep sats >92%   Severe Lactic Acidosis - Likely due to severe critical state as above  - Improved lactic acid 3.0 (9.0) - Bicarb 22, normal, stop Bicarb infusion  AKI on CKD3 baseline Scr~2.1-2.4  HAGMA Presented with severe hypoglycemia, has a history of T2DM on home medication Jardiance, no metformin. Acidosis likely due to organ hypoperfusion in the setting of shock. Presently Oliguric.  - S/p fluids - Cr  4.84 (4.82); BUN 119 (102) - Maintain adequate perfusion  - Strict I/O  Type 2 Diabetes Mellitus   Hypoglycemia Hypoglycemic event in the setting of AKI, decrease renal clearance of Jardiance and septic state.  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  - IV Milrinone increase peripheral perfusion and cardiac contractility - Holding home meds: Bisoprolol 5 mg daily, lasix 40 mg daily, hydralazine 25 mg  TID, IMDUR 15 mg daily, metolazone 2.5 mg daily.   Hx of PVC - Holding Home medication Amiodarone 200 mg today in the setting of shock    Elevated LFTs Coagulopathy - Hepatic congestion/Shock Liver in the setting of shock  - Labs this AM: AST 4931, ALT 2675, ALP 163, T bili 3.7 - Holding home statin and amio for now  - Inotropes and vasopressors to maintain adequate perfusion - Trend LFTs and coag studies daily   Leukocytosis - WBC 12.1 (12.3) - trend   Anemia, normocytic - Hgb 9.7 (10.9) - Trend  Thrombocytopenia - Likely due to sepsis  - PLT 82 (113) - trend   Hyperphosphatemia - P 7.5 , trend  - Renal failure   7mm RML nodule. -  Repeat CT at 6 - 12 months   Acute encephalopathy Lethargic this AM.  - Avoid deliriogenic meds - PT/OT when able   Lumbar Radiculitis Chronic Pain syndrome - Hold home medication Effexor 75 mg  - Hold home medication PRN Norco/Vicodin 5-325 q6h   Best Practice (right click and "Reselect all SmartList Selections" daily)   Diet/type: Mechanical soft diet  DVT prophylaxis: INR elevated.  Pressure ulcer(s): Assessed by RN  GI prophylaxis: N/A Lines: RIJ  Foley:  Yes, and it is still needed Code Status:  full code Last date of multidisciplinary goals of care discussion Stann Mainland update son today]  Labs   CBC: Recent Labs  Lab 05/08/23 1155 05/08/23 1212 05/08/23 1805 05/09/23 0536  WBC 12.4*  --  12.3* 12.1*  NEUTROABS 11.0*  --   --   --   HGB 11.4* 13.6 10.9* 9.7*  HCT 36.8 40.0 33.5* 28.6*  MCV 104.8*  --  100.3* 95.0  PLT 132*  --  113* 82*    Basic Metabolic Panel: Recent Labs  Lab 05/08/23 1155 05/08/23 1212 05/08/23 1805 05/09/23 0428  NA 135 136 140 140  K 4.9 4.8 4.4 4.0  CL 98 105 102 101  CO2 13*  --  15* 22  GLUCOSE 291* 251* 115* 149*  BUN 99* 102* 102* 119*  CREATININE 4.71* 4.60* 4.82* 4.84*  CALCIUM 8.7*  --  8.3* 8.0*  MG  --   --   --  2.3  PHOS  --   --   --  7.5*   GFR: Estimated Creatinine Clearance: 8.6 mL/min (A) (by C-G formula based on SCr of 4.84 mg/dL (H)). Recent Labs  Lab 05/08/23 1155 05/08/23 1349 05/08/23 1512 05/08/23 1805 05/09/23 0536  PROCALCITON  --   --   --  2.21  --   WBC 12.4*  --   --  12.3* 12.1*  LATICACIDVEN >9.0* >9.0* >9.0*  --   --     Liver Function Tests: Recent Labs  Lab 05/08/23 1155 05/08/23 1805  AST 2,396* 4,662*  ALT 1,762* 2,548*  ALKPHOS 205* 202*  BILITOT 2.9* 3.6*  PROT 5.7* 5.2*  ALBUMIN 2.9* 2.7*   No results for input(s): "LIPASE", "AMYLASE" in the last 168 hours. No results for input(s): "AMMONIA" in the last 168 hours.  ABG    Component Value Date/Time   PHART 7.382  10/27/2021 0906   PCO2ART 39.8 10/27/2021 0906   PO2ART 70 (L) 10/27/2021 0906   HCO3 12.2 (L) 05/08/2023 1247   TCO2 16 (L) 05/08/2023 1212   ACIDBASEDEF 19.2 (H) 05/08/2023 1247   O2SAT 67.9 05/09/2023 0438     Coagulation Profile: Recent Labs  Lab 05/08/23 1155 05/09/23 0428  INR  3.2* 4.8*    Cardiac Enzymes: Recent Labs  Lab 05/08/23 1355  CKTOTAL 345*    HbA1C: Hgb A1c MFr Bld  Date/Time Value Ref Range Status  03/09/2023 10:18 AM 6.0 (H) 4.8 - 5.6 % Final    Comment:             Prediabetes: 5.7 - 6.4          Diabetes: >6.4          Glycemic control for adults with diabetes: <7.0   05/26/2022 03:46 PM 6.5 (H) 4.8 - 5.6 % Final    Comment:             Prediabetes: 5.7 - 6.4          Diabetes: >6.4          Glycemic control for adults with diabetes: <7.0     CBG: Recent Labs  Lab 05/08/23 1717 05/08/23 1808 05/08/23 1943 05/08/23 2355 05/09/23 0432  GLUCAP 69* 99 82 128* 147*    Review of Systems:   As above   Past Medical History:  She,  has a past medical history of Anemia, Anxiety, Arthritis, COPD (chronic obstructive pulmonary disease) (HCC), CVA (cerebral vascular accident) (HCC), Depression, Diabetes mellitus without complication (HCC), Dry eyes (04/14/2014), GERD (gastroesophageal reflux disease), Glaucoma, Gout, HTN (hypertension), and OSA (obstructive sleep apnea).   Surgical History:   Past Surgical History:  Procedure Laterality Date   ABDOMINAL HYSTERECTOMY     BACK SURGERY     lumbar-disc   BIV PACEMAKER INSERTION CRT-P N/A 07/12/2022   Procedure: BIV PACEMAKER INSERTION CRT-P;  Surgeon: Duke Salvia, MD;  Location: [facility] INVASIVE CV LAB;  Service: Cardiovascular;  Laterality: N/A;   BUNIONECTOMY Bilateral    FOOT SURGERY Left    repair of "kissing Cousins"   JOINT REPLACEMENT Bilateral    hip    JOINT REPLACEMENT Right 2010 or 2012   knee   KNEE SURGERY     RIGHT/LEFT HEART CATH AND CORONARY ANGIOGRAPHY N/A 10/27/2021    Procedure: RIGHT/LEFT HEART CATH AND CORONARY ANGIOGRAPHY;  Surgeon: Corky Crafts, MD;  Location: [facility] INVASIVE CV LAB;  Service: Cardiovascular;  Laterality: N/A;   SHOULDER ARTHROSCOPY Right    shoulder surgery in [place] about 2000   SHOULDER HEMI-ARTHROPLASTY Left 03/25/2014   Procedure: LEFT SHOULDER HEMI-ARTHROPLASTY;  Surgeon: Vickki Hearing, MD;  Location: [facility] ORS;  Service: Orthopedics;  Laterality: Left;     Social History:   reports that she quit smoking about 38 years ago. Her smoking use included cigarettes. She started smoking about 78 years ago. She has a 40 pack-year smoking history. She has never used smokeless tobacco. She reports that she does not drink alcohol and does not use drugs.   Family History:  Her family history includes Aneurysm in her father; Congestive Heart Failure in her niece; Diabetes in her brother; High blood pressure in her mother; Hypertension in her niece; Pancreatitis in her brother.   Allergies Allergies  Allergen Reactions   Lovastatin Swelling    Patient states that her tongue swells and she gets a tingling feeling all over.   Lisinopril Swelling    Tongue swelling     Home Medications  Prior to Admission medications   Medication Sig Start Date End Date Taking? Authorizing Provider  albuterol ([name]) 108 (90 Base) MCG/ACT inhaler Inhale 2 puffs into the lungs every 6 (six) hours as needed for wheezing or shortness of breath. 01/12/23  Yes Clent Ridges,  Earnstine Regal, NP  allopurinol (ZYLOPRIM) 300 MG tablet TAKE ONE-HALF TABLET BY MOUTH EVERY DAY 03/23/23  Yes [name], MD  amiodarone (PACERONE) 200 MG tablet Take 1 tablet (200 mg total) by mouth 2 (two) times daily for 14 days, THEN 1 tablet (200 mg total) daily. Patient taking differently: Patient takes 1 tablet by mouth daily. 12/20/22 01/03/24 Yes Laurey Morale, MD  atorvastatin (LIPITOR) 20 MG tablet Take 1 tablet (20 mg total) by mouth daily. 07/03/22 01/08/24 Yes Laurey Morale, MD  bisoprolol (ZEBETA) 5 MG tablet Take 1 tablet (5 mg total) by mouth daily. 11/27/22  Yes Laurey Morale, MD  Fluticasone-Umeclidin-Vilant (TRELEGY ELLIPTA) 200-62.5-25 MCG/ACT AEPB Inhale 1 puff into the lungs daily. 03/09/23  Yes [name], MD  furosemide (LASIX) 40 MG tablet Take 1 tablet (40 mg total) by mouth daily. 03/29/23 06/27/23 Yes Laurey Morale, MD  isosorbide mononitrate (IMDUR) 30 MG 24 hr tablet Take 0.5 tablets (15 mg total) by mouth daily. 10/18/22 03/11/24 Yes Alen Bleacher, NP  nitrofurantoin, macrocrystal-monohydrate, (MACROBID) 100 MG capsule Take 100 mg by mouth every 12 (twelve) hours. 05/02/23  Yes [provider]  venlafaxine XR (EFFEXOR-XR) 75 MG 24 hr capsule Take 1 capsule (75 mg total) by mouth See admin instructions. Patient taking differently: Take 75 mg by mouth See admin instructions. Daily 12/28/22  Yes Fanny Dance, MD  zolpidem (AMBIEN) 5 MG tablet TAKE ONE TABLET BY MOUTH AT BEDTIME FOR SLEEP 04/17/23  Yes [name], MD  acetaminophen (TYLENOL) 500 MG tablet Take 500 mg by mouth every 6 (six) hours as needed for mild pain or moderate pain.    [provider]  aspirin (ASPIRIN CHILDRENS) 81 MG chewable tablet Chew 1 tablet (81 mg total) by mouth daily. 05/05/22   Mallipeddi, Vishnu P, MD  chlorpheniramine (CHLOR-TRIMETON) 4 MG tablet Take 4 mg by mouth daily as needed for allergies.    [provider]  Cholecalciferol (DIALYVITE VITAMIN D 5000 PO) Take 5,000 Units by mouth daily.    [provider]  empagliflozin (JARDIANCE) 10 MG TABS tablet Take 1 tablet (10 mg total) by mouth daily before breakfast. 03/22/23   Laurey Morale, MD  famotidine (PEPCID) 20 MG tablet Take 1 tablet (20 mg total) by mouth at bedtime. One after supper 10/10/21   Vassie Loll, MD  hydrALAZINE (APRESOLINE) 25 MG tablet Take 1 tablet (25 mg total) by mouth 3 (three) times daily. 03/12/23 03/06/24  Lee, [place], NP   HYDROcodone-acetaminophen (NORCO/VICODIN) 5-325 MG tablet Take 1 tablet by mouth every 6 (six) hours as needed for moderate pain (pain score 4-6). 02/17/23   Fanny Dance, MD  ketoconazole (NIZORAL) 2 % cream Apply 1 Application topically daily. 01/15/23   Anabel Halon, MD  metolazone (ZAROXOLYN) 2.5 MG tablet Take one tablet of metolazone by mouth today before your Lasix dose, then stop. 03/29/23   Laurey Morale, MD  Multiple Vitamins-Minerals (MULTIVITAMIN WITH MINERALS) tablet Take 1 tablet by mouth daily.    [provider]  Propylene Glycol (SYSTANE COMPLETE) 0.6 % SOLN Place 1 drop into both eyes 2 (two) times daily.    [provider]     Critical care time:

## 2023-05-09 NOTE — Progress Notes (Signed)
 Pharmacy Antibiotic Note  Heather [name] is a 83 y.o. female admitted on 05/08/2023 with shock - septic vs cardiogenic per CCM.  Pharmacy consulted for vancomycin and cefepime dosing at this time. WBC stable from admission, afebrile, LA resolved from >9 to 3.0. Renal function still markedly up from baseline at 4.84 (from ~2), additionally findings of shock liver with rising LFTs to 4662/2548. Blood cultures were drawn prior to antibiotic administration. Does not meet criteria for multidrug resistant organisms (PsA, MRSA). Plan for 7 day course CAP coverage  3/4 CXR: worsening airspace disease, L > R lung  Plan: Initiate ceftriaxone 2g q24h Continue doxycycline Discontinue vancomycin & cefepime Follow-up renal function, cultures   Height: 5\' 2"  (157.5 cm) Weight: 73.6 kg (162 lb 4.1 oz) IBW/kg (Calculated) : 50.1  Temp (24hrs), Avg:98.3 F (36.8 C), Min:97.5 F (36.4 C), Max:100.5 F (38.1 C)  Recent Labs  Lab 05/08/23 1155 05/08/23 1212 05/08/23 1349 05/08/23 1512 05/08/23 1805 05/09/23 0428 05/09/23 0457 05/09/23 0536 05/09/23 0650  WBC 12.4*  --   --   --  12.3*  --   --  12.1*  --   CREATININE 4.71* 4.60*  --   --  4.82* 4.84*  --   --   --   LATICACIDVEN >9.0*  --  >9.0* >9.0*  --   --   --   --  3.0*  VANCORANDOM  --   --   --   --   --   --  16  --   --     Estimated Creatinine Clearance: 8.6 mL/min (A) (by C-G formula based on SCr of 4.84 mg/dL (H)).    Allergies  Allergen Reactions   Lovastatin Swelling    Patient states that her tongue swells and she gets a tingling feeling all over.   Lisinopril Swelling    Tongue swelling    Antimicrobials this admission: Ceftriaxone 3/5 >> (3/10) Doxycycline 3/4 >> (3/10) Vancomycin 3/4 Cefepime 3/4  Flagyl 3/4 x1   Dose adjustments 1250 load in ED (1413): eVp 25 (VD 50) - current population kinetics for 500 q48 = eAUC 458.4, eVt 14.8 Vanc random 16 at 0457 = single level Ke 0.03469, T1/2 19.97; Clv 1.787 > total  daily dose 893.5  Microbiology results: 3/4 BCx: ng at 24h  3/4 MRSA PCR: negative 3/4 RVP negative  Thank you for allowing pharmacy to be a part of this patient's care.  Rutherford Nail, PharmD PGY2 Critical Care Pharmacy Resident 05/09/2023 9:36 AM

## 2023-05-09 NOTE — Progress Notes (Signed)
 Heart Failure Navigator Progress Note  Assessed for Heart & Vascular TOC clinic readiness.  Patient does not meet criteria due to Advanced Heart Failure Team patient of Dr. Shirlee Latch.   Navigator will sign off at this time.    Rhae Hammock, BSN, Scientist, clinical (histocompatibility and immunogenetics) Only

## 2023-05-10 DIAGNOSIS — R579 Shock, unspecified: Secondary | ICD-10-CM | POA: Diagnosis not present

## 2023-05-10 DIAGNOSIS — A419 Sepsis, unspecified organism: Secondary | ICD-10-CM | POA: Diagnosis not present

## 2023-05-10 DIAGNOSIS — J181 Lobar pneumonia, unspecified organism: Secondary | ICD-10-CM | POA: Diagnosis not present

## 2023-05-10 DIAGNOSIS — I5023 Acute on chronic systolic (congestive) heart failure: Secondary | ICD-10-CM | POA: Diagnosis not present

## 2023-05-10 LAB — HEPATIC FUNCTION PANEL
ALT: 2618 U/L — ABNORMAL HIGH (ref 0–44)
AST: 2916 U/L — ABNORMAL HIGH (ref 15–41)
Albumin: 2.6 g/dL — ABNORMAL LOW (ref 3.5–5.0)
Alkaline Phosphatase: 163 U/L — ABNORMAL HIGH (ref 38–126)
Bilirubin, Direct: 2.3 mg/dL — ABNORMAL HIGH (ref 0.0–0.2)
Indirect Bilirubin: 1.6 mg/dL — ABNORMAL HIGH (ref 0.3–0.9)
Total Bilirubin: 3.9 mg/dL — ABNORMAL HIGH (ref 0.0–1.2)
Total Protein: 4.5 g/dL — ABNORMAL LOW (ref 6.5–8.1)

## 2023-05-10 LAB — MAGNESIUM: Magnesium: 2.3 mg/dL (ref 1.7–2.4)

## 2023-05-10 LAB — BASIC METABOLIC PANEL
Anion gap: 16 — ABNORMAL HIGH (ref 5–15)
BUN: 119 mg/dL — ABNORMAL HIGH (ref 8–23)
CO2: 23 mmol/L (ref 22–32)
Calcium: 8.1 mg/dL — ABNORMAL LOW (ref 8.9–10.3)
Chloride: 100 mmol/L (ref 98–111)
Creatinine, Ser: 4.87 mg/dL — ABNORMAL HIGH (ref 0.44–1.00)
GFR, Estimated: 8 mL/min — ABNORMAL LOW (ref >60)
Glucose, Bld: 96 mg/dL (ref 70–99)
Potassium: 3.3 mmol/L — ABNORMAL LOW (ref 3.5–5.1)
Sodium: 139 mmol/L (ref 135–145)

## 2023-05-10 LAB — CBC
HCT: 30.1 % — ABNORMAL LOW (ref 36.0–46.0)
Hemoglobin: 10.6 g/dL — ABNORMAL LOW (ref 12.0–15.0)
MCH: 32.2 pg (ref 26.0–34.0)
MCHC: 35.2 g/dL (ref 30.0–36.0)
MCV: 91.5 fL (ref 80.0–100.0)
Platelets: 73 10*3/uL — ABNORMAL LOW (ref 150–400)
RBC: 3.29 MIL/uL — ABNORMAL LOW (ref 3.87–5.11)
RDW: 15.3 % (ref 11.5–15.5)
WBC: 11.6 10*3/uL — ABNORMAL HIGH (ref 4.0–10.5)
nRBC: 0.6 % — ABNORMAL HIGH (ref 0.0–0.2)

## 2023-05-10 LAB — GLUCOSE, CAPILLARY
Glucose-Capillary: 81 mg/dL (ref 70–99)
Glucose-Capillary: 89 mg/dL (ref 70–99)
Glucose-Capillary: 91 mg/dL (ref 70–99)
Glucose-Capillary: 76 mg/dL (ref 70–99)
Glucose-Capillary: 85 mg/dL (ref 70–99)
Glucose-Capillary: 80 mg/dL (ref 70–99)
Glucose-Capillary: 94 mg/dL (ref 70–99)

## 2023-05-10 LAB — COOXEMETRY PANEL
Carboxyhemoglobin: 1.1 % (ref 0.5–1.5)
Methemoglobin: <0.7 % (ref 0.0–1.5)
O2 Saturation: 75.8 %
Total hemoglobin: 11 g/dL — ABNORMAL LOW (ref 12.0–16.0)

## 2023-05-10 LAB — PROTIME-INR
INR: 3.9 — ABNORMAL HIGH (ref 0.8–1.2)
Prothrombin Time: 38.8 s — ABNORMAL HIGH (ref 11.4–15.2)

## 2023-05-10 LAB — PHOSPHORUS: Phosphorus: 5.5 mg/dL — ABNORMAL HIGH (ref 2.5–4.6)

## 2023-05-10 LAB — AMMONIA: Ammonia: 19 umol/L (ref 9–35)

## 2023-05-10 MED ORDER — POTASSIUM CHLORIDE 10 MEQ/50ML IV SOLN
10.0000 meq | INTRAVENOUS | Status: AC
  Administered 2023-05-10 (×4): 10 meq via INTRAVENOUS
  Filled 2023-05-10 (×4): qty 50

## 2023-05-10 MED ORDER — ORAL CARE MOUTH RINSE: 15.0000 mL | OROMUCOSAL | Status: DC | PRN

## 2023-05-10 MED ORDER — POTASSIUM CHLORIDE 20 MEQ PO PACK: 40.0000 meq | PACK | Freq: Once | ORAL | Status: DC

## 2023-05-10 MED ORDER — PHENOL 1.4 % MT LIQD: 1.0000 | OROMUCOSAL | Status: DC | PRN

## 2023-05-10 MED ORDER — ORAL CARE MOUTH RINSE
15.0000 mL | OROMUCOSAL | Status: DC
  Administered 2023-05-10 – 2023-05-21 (×42): 15 mL via OROMUCOSAL

## 2023-05-10 MED ORDER — LACTATED RINGERS IV BOLUS: 500.0000 mL | Freq: Once | INTRAVENOUS | Status: DC

## 2023-05-10 MED ORDER — LACTATED RINGERS IV BOLUS
500.0000 mL | Freq: Once | INTRAVENOUS | Status: AC
  Administered 2023-05-10: 500 mL via INTRAVENOUS

## 2023-05-10 NOTE — Evaluation (Signed)
 Physical Therapy Evaluation Patient Details Name: Heather [name] MRN: [id number] DOB: 1940-05-27 Today's Date: 05/10/2023  History of Present Illness  83 y/o woman admitted from [facility] 3/4 with confusion, hypoglycemia, CAP and shock after wellfare check. PMhx: HFrEF, HTN, COPD, DM  Clinical Impression  Pt with flat affect, not oriented to time or place with difficulty responding to questions and commands. Per prior admission pt living alone in an apartment and will need to confirm home setup and assist available at D/C. Pt with decreased strength, balance, transfers and cognition who will benefit from acute therapy to maximize safety and independence. Pt with noted UB jerking throughout mobility as well.  4L with inconsistent pleth and SPO2 83-96% HR 72        If plan is discharge home, recommend the following: A little help with walking and/or transfers;A lot of help with bathing/dressing/bathroom;Assistance with cooking/housework;Assist for transportation   Can travel by private vehicle   Yes    Equipment Recommendations Rolling walker (2 wheels);BSC/3in1  Recommendations for Other Services       Functional Status Assessment Patient has had a recent decline in their functional status and demonstrates the ability to make significant improvements in function in a reasonable and predictable amount of time.     Precautions / Restrictions Precautions Precautions: Fall      Mobility  Bed Mobility Overal bed mobility: Needs Assistance Bed Mobility: Supine to Sit     Supine to sit: Min assist, HOB elevated     General bed mobility comments: HOb 20 degrees with min assist to pivot to EOB and increased time    Transfers Overall transfer level: Needs assistance   Transfers: Sit to/from Stand, Bed to chair/wheelchair/BSC Sit to Stand: Min assist, Mod assist Stand pivot transfers: Min assist         General transfer comment: min assist to rise from surface and pivot to chair.  Mod assist to rise from recliner with cues for hand placement    Ambulation/Gait Ambulation/Gait assistance: Min assist Gait Distance (Feet): 30 Feet Assistive device: Rolling walker (2 wheels) Gait Pattern/deviations: Step-through pattern, Decreased stride length   Gait velocity interpretation: <1.8 ft/sec, indicate of risk for recurrent falls   General Gait Details: cues for direction with min assist to direct and advance RW  Stairs            Wheelchair Mobility     Tilt Bed    Modified Rankin (Stroke Patients Only)       Balance Overall balance assessment: Needs assistance Sitting-balance support: No upper extremity supported, Feet supported Sitting balance-Leahy Scale: Fair     Standing balance support: Bilateral upper extremity supported Standing balance-Leahy Scale: Poor                               Pertinent Vitals/Pain Pain Assessment Pain Assessment: No/denies pain    Home Living Family/patient expects to be discharged to:: Private residence Living Arrangements: Alone   Type of Home: Apartment Home Access: Level entry       Home Layout: One level   Additional Comments: pt with AMs and unable to provide PLOF with information taken from prior admission and will need to confirm accuracy    Prior Function Prior Level of Function : Independent/Modified Independent                     Extremity/Trunk Assessment   Upper Extremity Assessment Upper  Extremity Assessment: Generalized weakness    Lower Extremity Assessment Lower Extremity Assessment: Generalized weakness    Cervical / Trunk Assessment Cervical / Trunk Assessment: Normal  Communication   Communication Communication: No apparent difficulties    Cognition Arousal: Lethargic Behavior During Therapy: Flat affect   PT - Cognitive impairments: No family/caregiver present to determine baseline, Difficult to assess, Orientation, Awareness, Memory, Attention,  Initiation, Problem solving, Safety/Judgement   Orientation impairments: Time, Situation, Place                   PT - Cognition Comments: pt able to state name and initiate DOB but required cues to complete date. Pt unable to provide PLOF, slow processing with assist to initiate movement Following commands: Impaired Following commands impaired: Follows one step commands inconsistently, Follows one step commands with increased time     Cueing Cueing Techniques: Verbal cues, Gestural cues, Tactile cues     General Comments      Exercises     Assessment/Plan    PT Assessment Patient needs continued PT services  PT Problem List Decreased strength;Decreased activity tolerance;Decreased balance;Decreased mobility;Decreased knowledge of use of DME       PT Treatment Interventions DME instruction;Gait training;Functional mobility training;Patient/family education;Therapeutic activities;Therapeutic exercise;Cognitive remediation;Neuromuscular re-education;Balance training    PT Goals (Current goals can be found in the Care Plan section)  Acute Rehab PT Goals PT Goal Formulation: Patient unable to participate in goal setting Time For Goal Achievement: 05/24/23 Potential to Achieve Goals: Fair    Frequency Min 2X/week     Co-evaluation               AM-PAC PT "6 Clicks" Mobility  Outcome Measure Help needed turning from your back to your side while in a flat bed without using bedrails?: A Little Help needed moving from lying on your back to sitting on the side of a flat bed without using bedrails?: A Little Help needed moving to and from a bed to a chair (including a wheelchair)?: A Little Help needed standing up from a chair using your arms (e.g., wheelchair or bedside chair)?: A Little Help needed to walk in hospital room?: A Lot Help needed climbing 3-5 steps with a railing? : Total 6 Click Score: 15    End of Session Equipment Utilized During Treatment: Gait  belt;Oxygen Activity Tolerance: Patient tolerated treatment well Patient left: in chair;with call bell/phone within reach;with chair alarm set Nurse Communication: Mobility status PT Visit Diagnosis: Other abnormalities of gait and mobility (R26.89);Difficulty in walking, not elsewhere classified (R26.2);Muscle weakness (generalized) (M62.81)    Time: 5643-3295 PT Time Calculation (min) (ACUTE ONLY): 31 min   Charges:   PT Evaluation $PT Eval Moderate Complexity: 1 Mod PT Treatments $Gait Training: 8-22 mins PT General Charges $$ ACUTE PT VISIT: 1 Visit         Merryl Hacker, PT Acute Rehabilitation Services Office: [phone]   Enedina Finner Akima Slaugh 05/10/2023, 9:48 AM

## 2023-05-10 NOTE — Progress Notes (Addendum)
 NAME:  Heather [name], MRN:  [id number], DOB:  [birth year], LOS: 2 ADMISSION DATE:  05/08/2023, CONSULTATION DATE:  05/08/2023 REFERRING MD:  Charm Barges, CHIEF COMPLAINT:  Weakness and AMS   History of Present Illness:  Heather [name] is a 83 y.o. female who has a PMH as below. Police was called 3/4 for a wellness visit in her apartment because she was unreachable by phone from her son. Upon their arrival, she was found to have AMS. She was last known well the day prior. EMS was called and upon their arrival, CBG was 40 and she was given IM glucagon before being transported to [facility] ED.    In ED, initial CBG too low to pickup. ABG with profound metabolic acidosis (6.97/54/42). Workup also noteable for AKI with SCr 4.71/BUN 99, AG 24, ALT 1762, trop 245, lactate > 9, WBC 12.4. Tmax 100.5 rectally, BP and HR normal. UA unremarkable. CT head and CT chest/abd/pelv pending.   Due to her multiple metabolic derangements, PCCM called for transfer to [facility] ICU.   Of note, cMRI in Jan 2024 showed LVEF 16%, mild apical inferolateral subendocardial LGE, mod dilated RV with RVEF 23%. Echo from April 2024 showed EF < 20% with mod LV enlargement, septal lateral dyssynchrony, mod RV dysfunction, PASP 65. She was referred to EP and underwent St Jude CRT-P placement 5/24. Echo 9/24, EF 20-25%, mild LV dilation, moderate RV dysfunction with normal size. On arrival at [facility] BG was 99.  Pertinent  Medical History  Rotator cuff arthropathy; Renal insufficiency; Essential hypertension; Gout; Long-term current use of opiate analgesic; Bilateral carpal tunnel syndrome; Chronic low back pain; Lumbosacral radiculopathy; Polyarthropathy; Sacroiliac joint pain; Preventative health care; Hyperlipidemia; Anxiety; COPD GOLD 1 ; Gastroesophageal reflux disease; Intertrigo; AKI (acute kidney injury) (HCC); Prediabetes; Leg edema; DOE (dyspnea on exertion); Chronic kidney disease, stage IV (severe) (HCC); Tremor; Acute kidney injury (HCC); Leg  swelling; SOBOE (shortness of breath on exertion); Hyperkalemia; Acute systolic CHF (congestive heart failure) (HCC); History of cerebellar stroke; OSA on CPAP; Bloating; Acute on chronic systolic CHF (congestive heart failure) (HCC); Prolonged QT interval; Chronic systolic HF (heart failure) (HCC); LVF (left ventricular failure) (HCC); 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   Significant Hospital Events: Including procedures, antibiotic start and stop dates in addition to other pertinent events   3/4 Admitted to PCCM, Started on IV cefepime/van/doxy. Started on IV Milrinone and IV Bicarb. ECHO ordered 3/5 Stopped Vanc. MRSA swab negative. De-escalate cefepime to rocephin. Stopped Bicarb infusion.   Interim History / Subjective:  No over night events - Patient report she did not sleep well last night, unable to tell why.  - Headaches - Reports throat pain and wet cough with phlegm  - Abdominal pain   Objective   Blood pressure (!) 115/52, pulse 60, temperature 98.1 F (36.7 C), resp. rate 20, height 5\' 2"  (1.575 m), weight 73.6 kg, SpO2 96%. CVP:  [9 mmHg-10 mmHg] 9 mmHg      Intake/Output Summary (Last 24 hours) at 05/10/2023 0626 Last data filed at 05/10/2023 0500 Gross per 24 hour  Intake 1789.53 ml  Output 675 ml  Net 1114.53 ml   Filed Weights   05/08/23 1156 05/08/23 1800 05/09/23 0500  Weight: 69.4 kg 72.6 kg  73.6 kg    Examination: General: Appears chronically ill, laying in bed, no acute distress  Neuro: Awake, alert and oriented to self and location. Slow to answer questions, follows commands   HENT: [place]/AT. Dry MM  Lungs: Breathing comfortable, no wheezing.  Cough with yellowish-phelgm  Cardiovascular: RR.  Abdomen: +Epigastric tenderness  Extremities: warm, No LE edema  GU: Not assessed.   Pertinent Imaging: CTH : No acute CT finding. Age related volume loss. Mild chronic small-vessel ischemic change of the white matter CT CAP: Dependent atelectasis/consolidation in both lower lobes. 7 mm right middle lobe nodule. Right Spigelian hernia containing only fat.  CXR: Cardiomegaly. Worsening airspace disease at the left > right lung base which may be due to atelectasis or pneumonia. Possible small left effusion. ECHO today: Global hypokinesis worse apex and septum with akinesis. LVEF 25% with regional wall motion abnormalities. Severely enlarged LV. G I DD. RV function normal.   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  - IV Milrinone to increase peripheral perfusion and cardiac contractility - Serial Co-ox and CVP - Co-ox 75.8 (67.9)/ CVP 9-11 - Holding home meds: Bisoprolol 5 mg daily, lasix 40 mg daily, hydralazine 25 mg TID, IMDUR 15 mg daily, metolazone 2.5 mg daily.   Septic shock Bilateral lower lobe Pneumonia - ABX IV Doxy 3/7 and Rocephin 2/6   Severe High Anion Gap Metabolic Acidosis Lactic acidosis - improved  - Likely due to severe critical state as above  - Improved lactic acid 3.0 (9.0) and AG 16 - S/p Bicarb infusion 3/4-3/5  Acute Hypoxic Respiratory Failure  Odynophagia  Hx of COPD - Supplemental O2 as needed, Goal 88-92%  - Bronchodilators with Budesonide/Brovana/Yupelri,  takes Trelegy at home - Chloraseptic spray - Follow up on sputum analysis  Shock Liver - Holding home statin and amio for now  - Inotropes and vasopressors to maintain adequate perfusion - AST 2916/ ALT 2618/ ALP 163/ T bili 3.9  - Trend LFTs   Coagulopathy S/p Vit K - PT 38.8 (45.3)/ INR 3.9 (4.8), improving - Trend daily   AKI on CKD3 baseline Scr~2.1-2.4  Oliguric - Cr 4.87 (4.84), BUN 119 (119) - Maintain adequate perfusion  - Strict I/O  Hyperphosphatemia likely from renal failure from shock - P 5.5 (7.5)   Hypokalemia - K 3.3 (4.0)  - Replete and recheck   Acute metabolic encephalopathy - Ammonia 19, WNL  - Delirious precautions  - PT/OT when able   Anemia, normocytic - Hgb 10.6 (9.7) - Trend   Thrombocytopenia - Likely due to sepsis  - PLT 73 (82) - trend   Hypoglycemia, likely from AKI with home meds- resolved H/o DM2 -hold PTA meds -hold SSI - P.o. intake still remains poor  Poor Nutrition Dysphagia - SLP eval pending - 500 cc LR bolus today - If remains NPO, cortrak tomorrow    RML 7mm nodule - Follow up outpatient   Lumbar Radiculitis Chronic Pain syndrome - Hold home medication Effexor 75 mg  - Hold home medication PRN Norco/Vicodin 5-325 q6h   Best Practice (right  click and "Reselect all SmartList Selections" daily)   Diet/type: NPO until SLP eval  DVT prophylaxis: Mobility and SCDs  Pressure ulcer(s): Assessed by RN  GI prophylaxis: H2B Lines: RIJ and peripheral  Foley:  Yes, and it is still needed Code Status:  full code Last date of multidisciplinary goals of care discussion [Updated sons and sister yesterday at bedside ]  Labs   CBC: Recent Labs  Lab 05/08/23 1155 05/08/23 1212 05/08/23 1805 05/09/23 0536 05/10/23 0344  WBC 12.4*  --  12.3* 12.1* 11.6*  NEUTROABS 11.0*  --   --   --   --   HGB 11.4* 13.6 10.9* 9.7* 10.6*  HCT 36.8 40.0 33.5* 28.6* 30.1*  MCV 104.8*  --  100.3* 95.0 91.5  PLT 132*  --  113* 82* 73*     Basic Metabolic Panel: Recent Labs  Lab 05/08/23 1155 05/08/23 1212 05/08/23 1805 05/09/23 0428 05/10/23 0344  NA 135 136 140 140 139  K 4.9 4.8 4.4 4.0 3.3*  CL 98 105 102 101 100  CO2 13*  --  15* 22 23  GLUCOSE 291* 251* 115* 149* 96  BUN 99* 102* 102* 119* 119*  CREATININE 4.71* 4.60* 4.82* 4.84* 4.87*  CALCIUM 8.7*  --  8.3* 8.0* 8.1*  MG  --   --   --  2.3 2.3  PHOS  --   --   --  7.5* 5.5*   GFR: Estimated Creatinine Clearance: 8.5 mL/min (A) (by C-G formula based on SCr of 4.87 mg/dL (H)). Recent Labs  Lab 05/08/23 1155 05/08/23 1349 05/08/23 1512 05/08/23 1805 05/09/23 0536 05/09/23 0650 05/10/23 0344  PROCALCITON  --   --   --  2.21  --   --   --   WBC 12.4*  --   --  12.3* 12.1*  --  11.6*  LATICACIDVEN >9.0* >9.0* >9.0*  --   --  3.0*  --     Liver Function Tests: Recent Labs  Lab 05/08/23 1155 05/08/23 1805 05/09/23 0428 05/10/23 0344  AST 2,396* 4,662* 4,931* 2,916*  ALT 1,762* 2,548* 2,675* 2,618*  ALKPHOS 205* 202* 163* 163*  BILITOT 2.9* 3.6* 3.7* 3.9*  PROT 5.7* 5.2* 4.6* 4.5*  ALBUMIN 2.9* 2.7* 2.7* 2.6*   No results for input(s): "LIPASE", "AMYLASE" in the last 168 hours. Recent Labs  Lab 05/10/23 0344  AMMONIA 19    ABG    Component Value Date/Time   PHART 7.382 10/27/2021 0906   PCO2ART 39.8 10/27/2021 0906   PO2ART 70 (L) 10/27/2021 0906   HCO3 12.2 (L) 05/08/2023 1247   TCO2 16 (L) 05/08/2023 1212   ACIDBASEDEF 19.2 (H) 05/08/2023 1247   O2SAT 75.8 05/10/2023 0344     Coagulation Profile: Recent Labs  Lab 05/08/23 1155 05/09/23 0428 05/10/23 0344  INR 3.2* 4.8* 3.9*    Cardiac Enzymes: Recent Labs  Lab 05/08/23 1355  CKTOTAL 345*    HbA1C: Hgb A1c MFr Bld  Date/Time Value Ref Range Status  03/09/2023 10:18 AM 6.0 (H) 4.8 - 5.6 % Final    Comment:             Prediabetes: 5.7 - 6.4          Diabetes: >6.4          Glycemic control for adults with diabetes: <7.0   05/26/2022 03:46 PM 6.5 (H) 4.8 -  5.6 % Final    Comment:  Prediabetes: 5.7 - 6.4          Diabetes: >6.4          Glycemic control for adults with diabetes: <7.0     CBG: Recent Labs  Lab 05/09/23 0432 05/09/23 0717 05/09/23 1128 05/09/23 1505 05/09/23 1923  GLUCAP 147* 125* 116* 124* 93    Review of Systems:   AS above   Past Medical History:  She,  has a past medical history of Anemia, Anxiety, Arthritis, COPD (chronic obstructive pulmonary disease) (HCC), CVA (cerebral vascular accident) (HCC), Depression, Diabetes mellitus without complication (HCC), Dry eyes (04/14/2014), GERD (gastroesophageal reflux disease), Glaucoma, Gout, HTN (hypertension), and OSA (obstructive sleep apnea).   Surgical History:   Past Surgical History:  Procedure Laterality Date   ABDOMINAL HYSTERECTOMY     BACK SURGERY     lumbar-disc   BIV PACEMAKER INSERTION CRT-P N/A 07/12/2022   Procedure: BIV PACEMAKER INSERTION CRT-P;  Surgeon: Duke Salvia, MD;  Location: [facility] INVASIVE CV LAB;  Service: Cardiovascular;  Laterality: N/A;   BUNIONECTOMY Bilateral    FOOT SURGERY Left    repair of "kissing Cousins"   JOINT REPLACEMENT Bilateral    hip    JOINT REPLACEMENT Right 2010 or 2012   knee   KNEE SURGERY     RIGHT/LEFT HEART CATH AND CORONARY ANGIOGRAPHY N/A 10/27/2021   Procedure: RIGHT/LEFT HEART CATH AND CORONARY ANGIOGRAPHY;  Surgeon: Corky Crafts, MD;  Location: [facility] INVASIVE CV LAB;  Service: Cardiovascular;  Laterality: N/A;   SHOULDER ARTHROSCOPY Right    shoulder surgery in [place] about 2000   SHOULDER HEMI-ARTHROPLASTY Left 03/25/2014   Procedure: LEFT SHOULDER HEMI-ARTHROPLASTY;  Surgeon: Vickki Hearing, MD;  Location: [facility] ORS;  Service: Orthopedics;  Laterality: Left;     Social History:   reports that she quit smoking about 38 years ago. Her smoking use included cigarettes. She started smoking about 78 years ago. She has a 40 pack-year smoking history. She has never used smokeless tobacco. She  reports that she does not drink alcohol and does not use drugs.   Family History:  Her family history includes Aneurysm in her father; Congestive Heart Failure in her niece; Diabetes in her brother; High blood pressure in her mother; Hypertension in her niece; Pancreatitis in her brother.   Allergies Allergies  Allergen Reactions   Lovastatin Swelling    Patient states that her tongue swells and she gets a tingling feeling all over.   Lisinopril Swelling    Tongue swelling     Home Medications  Prior to Admission medications   Medication Sig Start Date End Date Taking? Authorizing Provider  acetaminophen (TYLENOL) 500 MG tablet Take 500 mg by mouth every 6 (six) hours as needed for mild pain or moderate pain.    [provider]  albuterol ([name]) 108 (90 Base) MCG/ACT inhaler Inhale 2 puffs into the lungs every 6 (six) hours as needed for wheezing or shortness of breath. 01/12/23   Glenford Bayley, NP  allopurinol (ZYLOPRIM) 300 MG tablet TAKE ONE-HALF TABLET BY MOUTH EVERY DAY 03/23/23   [name], MD  amiodarone (PACERONE) 200 MG tablet Take 1 tablet (200 mg total) by mouth 2 (two) times daily for 14 days, THEN 1 tablet (200 mg total) daily. Patient taking differently: Patient takes 1 tablet by mouth daily. 12/20/22 01/03/24  Laurey Morale, MD  aspirin (ASPIRIN CHILDRENS) 81 MG chewable tablet Chew 1 tablet (81 mg total) by mouth daily. 05/05/22  Mallipeddi, Vishnu P, MD  atorvastatin (LIPITOR) 20 MG tablet Take 1 tablet (20 mg total) by mouth daily. 07/03/22 01/08/24  Laurey Morale, MD  bisoprolol (ZEBETA) 5 MG tablet Take 1 tablet (5 mg total) by mouth daily. 11/27/22   Laurey Morale, MD  chlorpheniramine (CHLOR-TRIMETON) 4 MG tablet Take 4 mg by mouth daily as needed for allergies.    [provider]  Cholecalciferol (DIALYVITE VITAMIN D 5000 PO) Take 5,000 Units by mouth daily.    [provider]  empagliflozin (JARDIANCE) 10 MG TABS  tablet Take 1 tablet (10 mg total) by mouth daily before breakfast. 03/22/23   Laurey Morale, MD  famotidine (PEPCID) 20 MG tablet Take 1 tablet (20 mg total) by mouth at bedtime. One after supper 10/10/21   Vassie Loll, MD  Fluticasone-Umeclidin-Vilant (TRELEGY ELLIPTA) 200-62.5-25 MCG/ACT AEPB Inhale 1 puff into the lungs daily. 03/09/23   [name], MD  furosemide (LASIX) 40 MG tablet Take 1 tablet (40 mg total) by mouth daily. 03/29/23 06/27/23  Laurey Morale, MD  hydrALAZINE (APRESOLINE) 25 MG tablet Take 1 tablet (25 mg total) by mouth 3 (three) times daily. 03/12/23 03/06/24  Lee, [place], NP  HYDROcodone-acetaminophen (NORCO/VICODIN) 5-325 MG tablet Take 1 tablet by mouth every 6 (six) hours as needed for moderate pain (pain score 4-6). 02/17/23   Fanny Dance, MD  isosorbide mononitrate (IMDUR) 30 MG 24 hr tablet Take 0.5 tablets (15 mg total) by mouth daily. 10/18/22 03/11/24  Alen Bleacher, NP  ketoconazole (NIZORAL) 2 % cream Apply 1 Application topically daily. 01/15/23   Anabel Halon, MD  metolazone (ZAROXOLYN) 2.5 MG tablet Take one tablet of metolazone by mouth today before your Lasix dose, then stop. 03/29/23   Laurey Morale, MD  Multiple Vitamins-Minerals (MULTIVITAMIN WITH MINERALS) tablet Take 1 tablet by mouth daily.    [provider]  nitrofurantoin, macrocrystal-monohydrate, (MACROBID) 100 MG capsule Take 100 mg by mouth every 12 (twelve) hours. 05/02/23   [provider]  Propylene Glycol (SYSTANE COMPLETE) 0.6 % SOLN Place 1 drop into both eyes 2 (two) times daily.    [provider]  venlafaxine XR (EFFEXOR-XR) 75 MG 24 hr capsule Take 1 capsule (75 mg total) by mouth See admin instructions. Patient taking differently: Take 75 mg by mouth See admin instructions. Daily 12/28/22   Fanny Dance, MD  zolpidem (AMBIEN) 5 MG tablet TAKE ONE TABLET BY MOUTH AT BEDTIME FOR SLEEP 04/17/23   [name], MD     Critical care time:

## 2023-05-10 NOTE — [place] FL2 (Signed)
 [place] MEDICAID FL2 LEVEL OF CARE FORM     IDENTIFICATION  Patient Name: Heather [name] Birthdate: 1940-07-18 Sex: female Admission Date (Current Location): 05/08/2023  [facility] and [place] Number:  [employer] and Address:  The . [facility], [address] [place], [place] [address]      Provider Number: [id number]  Attending Physician Name and Address:  Steffanie Dunn, DO  Relative Name and Phone Number:  Alanson Puls; Son; [phone]    Current Level of Care: Hospital Recommended Level of Care: Skilled Nursing Facility Prior Approval Number:    Date Approved/Denied:   PASRR Number: [id number]  Discharge Plan: SNF    Current Diagnoses: Patient Active Problem List   Diagnosis Date Noted   Acute sepsis (HCC) 05/08/2023   Cardiogenic shock (HCC) 05/08/2023   Primary insomnia 01/15/2023   Candidal vaginitis 01/15/2023   Need for influenza vaccination 12/01/2022   Type 2 diabetes mellitus with diabetic chronic kidney disease (HCC) 08/31/2022   Breast cancer screening by mammogram 08/31/2022   LVF (left ventricular failure) (HCC) 08/18/2022   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) 08/18/2022   NICM (nonischemic cardiomyopathy) (HCC) 08/18/2022   Chronic systolic HF (heart failure) (HCC) 05/26/2022   Acute on chronic systolic CHF (congestive heart failure) (HCC) 03/13/2022   Prolonged QT interval 03/13/2022   Bloating 02/02/2022   History of cerebellar stroke 11/04/2021   OSA on CPAP 11/04/2021   Acute systolic CHF (congestive heart failure) (HCC) 10/09/2021   SOBOE (shortness of breath on exertion) 10/07/2021   Hyperkalemia 10/07/2021   Adenomatous polyp of cecum 07/14/2021   Leg swelling 01/31/2021   Acute kidney injury (HCC) 12/14/2020   Tremor 06/21/2020   DOE (dyspnea on exertion) 05/25/2020   Chronic kidney disease, stage IV (severe) (HCC) 04/09/2020   Leg edema 02/03/2020   AKI (acute kidney  injury) (HCC) 01/16/2020   Prediabetes 01/16/2020   Preventative health care 01/13/2020   Hyperlipidemia 01/13/2020   Anxiety 01/13/2020   COPD GOLD 1  01/13/2020   Gastroesophageal reflux disease 01/13/2020   Intertrigo 01/13/2020   Long-term current use of opiate analgesic 07/07/2019   Sacroiliac joint pain 07/07/2019   Bilateral carpal tunnel syndrome 05/10/2019   Chronic low back pain 05/10/2019   Lumbosacral radiculopathy 05/10/2019   Polyarthropathy 05/10/2019   Gout 10/24/2018   Essential hypertension 04/17/2014   Renal insufficiency 04/01/2014   Rotator cuff arthropathy     Orientation RESPIRATION BLADDER Height & Weight     Self  O2 (4L nasal cannula) Incontinent, Indwelling catheter Weight: 162 lb 4.1 oz (73.6 kg) Height:  5\' 2"  (157.5 cm)  BEHAVIORAL SYMPTOMS/MOOD NEUROLOGICAL BOWEL NUTRITION STATUS      Continent Diet (Please see dc summary)  AMBULATORY STATUS COMMUNICATION OF NEEDS Skin   Limited Assist Verbally Normal                       Personal Care Assistance Level of Assistance  Bathing, Feeding, Dressing Bathing Assistance: Maximum assistance Feeding assistance: Maximum assistance Dressing Assistance: Maximum assistance     Functional Limitations Info             SPECIAL CARE FACTORS FREQUENCY  PT (By licensed PT), OT (By licensed OT)     PT Frequency: 5x OT Frequency: 5x            Contractures Contractures Info: Not present    Additional Factors Info  Code Status, Allergies Code  Status Info: Full Code Allergies Info: Lovastatin; Lisinopril           Current Medications (05/10/2023):  This is the current hospital active medication list Current Facility-Administered Medications  Medication Dose Route Frequency Provider Last Rate Last Admin   arformoterol (BROVANA) nebulizer solution 15 mcg  15 mcg Nebulization BID [name], Rahul P, PA-C   15 mcg at 05/10/23 0753   budesonide (PULMICORT) nebulizer solution 0.5 mg  0.5 mg  Nebulization BID [name], Rahul P, PA-C   0.5 mg at 05/10/23 0753   cefTRIAXone (ROCEPHIN) 2 g in sodium chloride 0.9 % 100 mL IVPB  2 g Intravenous Q24H Tawkaliyar, Roya, DO   Stopped at 05/09/23 1340   Chlorhexidine Gluconate Cloth 2 % PADS 6 each  6 each Topical Daily Karie Fetch P, DO   6 each at 05/10/23 1000   docusate sodium (COLACE) capsule 100 mg  100 mg Oral BID PRN [name], Rahul P, PA-C       doxycycline (VIBRAMYCIN) 100 mg in sodium chloride 0.9 % 250 mL IVPB  100 mg Intravenous Q12H Tawkaliyar, Roya, DO 125 mL/hr at 05/10/23 0850 100 mg at 05/10/23 0850   famotidine (PEPCID) tablet 10 mg  10 mg Oral Daily Tawkaliyar, Roya, DO   10 mg at 05/09/23 1640   feeding supplement (ENSURE ENLIVE / ENSURE PLUS) liquid 237 mL  237 mL Oral BID BM Tawkaliyar, Roya, DO   237 mL at 05/09/23 1500   lactated ringers bolus 500 mL  500 mL Intravenous Once Tawkaliyar, Roya, DO       milrinone (PRIMACOR) 20 MG/100 ML (0.2 mg/mL) infusion  0.25 mcg/kg/min Intravenous Continuous Karie Fetch P, DO 5.45 mL/hr at 05/10/23 0800 0.25 mcg/kg/min at 05/10/23 0800   norepinephrine (LEVOPHED) 4mg  in (0.016 mg/mL) premix infusion  2-10 mcg/min Intravenous Titrated Gloris Manchester, MD 18.75 mL/hr at 05/10/23 0800 5 mcg/min at 05/10/23 0800   Oral care mouth rinse  15 mL Mouth Rinse 4 times per day Karie Fetch P, DO   15 mL at 05/10/23 0800   Oral care mouth rinse  15 mL Mouth Rinse PRN Karie Fetch P, DO       phenol (CHLORASEPTIC) mouth spray 1 spray  1 spray Mouth/Throat PRN Tawkaliyar, Roya, DO       polyethylene glycol (MIRALAX / GLYCOLAX) packet 17 g  17 g Oral Daily PRN Desai, Rahul P, PA-C       potassium chloride 10 mEq in 50 mL *CENTRAL LINE* IVPB  10 mEq Intravenous Q1 Hr x 4 Knute Neu, RPH 50 mL/hr at 05/10/23 1120 10 mEq at 05/10/23 1120   revefenacin (YUPELRI) nebulizer solution 175 mcg  175 mcg Nebulization Daily Desai, Rahul P, PA-C   175 mcg at 05/10/23 0753   sodium chloride flush (NS) 0.9 %  injection 10-40 mL  10-40 mL Intracatheter Q12H Karie Fetch P, DO   10 mL at 05/09/23 1024   sodium chloride flush (NS) 0.9 % injection 10-40 mL  10-40 mL Intracatheter PRN Karie Fetch P, DO       white petrolatum (VASELINE) gel   Topical PRN Steffanie Dunn, DO   Given at 05/10/23 0630     Discharge Medications: Please see discharge summary for a list of discharge medications.  Relevant Imaging Results:  Relevant Lab Results:   Additional Information SS# [id number]  Marliss Coots, LCSW

## 2023-05-10 NOTE — Progress Notes (Signed)
 Ms. Nevins is an 83 y/o woman with a history of HFrEF who presented with confusion and shock from [facility]. Overnight no acute events. Denies complaints overnight.  UOP improved.  BP (!) 118/54   Pulse 72   Temp 98.2 F (36.8 C)   Resp 18   Ht 5\' 2"  (1.575 m)   Wt 73.6 kg   SpO2 92%   BMI 29.68 kg/m  Chronically ill appearing woman lying in bed in NAD Still confused, answering some questions, moving extremities on commands.  S1s2, RRR, paced Breathing comfortably on [place], rhales on R, wet sounding cough Abd soft, NT Minimal peripheral edema Skin warm, dry, no rashes CVP  11   Coox 76% K+ 3.3 BUN 119 Cr 4.87 AST 2916 ALT 2618 T bili 3.9 WBC 11.6 H/H 10.6/30.1 Platelets 73  I/O +500cc, net +3.2 for admission UOP 1.2L  Blood cultures: NGTD  Echo: LVEF 25%, severely dilated LV, G1DD. Severely dilated atria. Normal RV function. Normal IVC with normal respiratory variability. Moderate TR.  Assessment & plan:  Shock- likely mixed cardiogenic and septic; coox preserved. Perfusion better than presentation. Lactic acidosis, improved. -serial coox, con't NE to maintain MAP >65 -hold on diuresis for now; since not getting any PO intake, will give small IVF bolus today  Acute on chronic HFrEF w/ cardiogenic shock; troponin elevation due to acute illness Frequent PVCs -con't low dose milrinone + NE to maintain MAP >65 -serial coox, CVP -hold GDMT due to hypotension and renal failure -con't PTA amiodarone  Septic shock, bilateral lower lobe pneumonia -con't CAP antibiotics- ceftriaxone and doxy for 7 days -collect sputum culture -follow blood culture -vasopressors and inotropes to maintain MAP >65  Severe metabolic acidosis, lactic acidosis- resolved -daily BMP   Acute respiratory failure with hypoxia -wean O2 as able -con't antibiotics -con't brovana, yupelri  Shock liver -maintain adequate perfusion; eventually needs diuresis  AKI w/  oliguria hyperphosphatemia -strict I/O -renally dose meds, avoid nephrotoxic meds -maintain adequate perfusion  Acute metabolic encephalopathy due to sepsis, AKI,  and hypoglycemia; no hyperammonemia. -PT, OT, OOB mobility -frequent reorintation, family has been at bedside -avoid deliriogenc meds  Coagulopathy, s/p vit K -con't to monitor  Thrombocytopenia -no current need for transfusion -con't to hold chemical DVT prophylaxis  Hypoglycemia, likely from AKI with home meds- resolved H/o DM2 -hold PTA oral hypoglycemics  -not needing insulin, plus PO intake has been low  Dysphagia -SLP consult, NPO for now -may need cortrak tomorrow  RML 7mm nodule -needs OP follow up  Patient updated with sister at bedside during rounds. No family at bedside.  This patient is critically ill with multiple organ system failure which requires frequent high complexity decision making, assessment, support, evaluation, and titration of therapies. This was completed through the application of advanced monitoring technologies and extensive interpretation of multiple databases. During this encounter critical care time was devoted to patient care services described in this note for 39 minutes.  Steffanie Dunn, DO 05/10/23 12:02 PM [facility]  For contact information, see Amion. If no response to pager, please call PCCM consult pager. After hours, 7PM- 7AM, please call Elink.

## 2023-05-10 NOTE — Evaluation (Addendum)
 Clinical/Bedside Swallow Evaluation Patient Details  Name: Heather [name] MRN: [id number] Date of Birth: Feb 20, 1941  Today's Date: 05/10/2023 Time: SLP Start Time (ACUTE ONLY): 1234 SLP Stop Time (ACUTE ONLY): 1308 SLP Time Calculation (min) (ACUTE ONLY): 34 min  Past Medical History:  Past Medical History:  Diagnosis Date   Anemia    Anxiety    Arthritis    COPD (chronic obstructive pulmonary disease) (HCC)    CVA (cerebral vascular accident) (HCC)    Depression    Diabetes mellitus without complication (HCC)    Dry eyes 04/14/2014   GERD (gastroesophageal reflux disease)    Glaucoma    Gout    HTN (hypertension)    OSA (obstructive sleep apnea)    Past Surgical History:  Past Surgical History:  Procedure Laterality Date   ABDOMINAL HYSTERECTOMY     BACK SURGERY     lumbar-disc   BIV PACEMAKER INSERTION CRT-P N/A 07/12/2022   Procedure: BIV PACEMAKER INSERTION CRT-P;  Surgeon: Duke Salvia, MD;  Location: [facility] INVASIVE CV LAB;  Service: Cardiovascular;  Laterality: N/A;   BUNIONECTOMY Bilateral    FOOT SURGERY Left    repair of "kissing Cousins"   JOINT REPLACEMENT Bilateral    hip    JOINT REPLACEMENT Right 2010 or 2012   knee   KNEE SURGERY     RIGHT/LEFT HEART CATH AND CORONARY ANGIOGRAPHY N/A 10/27/2021   Procedure: RIGHT/LEFT HEART CATH AND CORONARY ANGIOGRAPHY;  Surgeon: Corky Crafts, MD;  Location: [facility] INVASIVE CV LAB;  Service: Cardiovascular;  Laterality: N/A;   SHOULDER ARTHROSCOPY Right    shoulder surgery in [place] about 2000   SHOULDER HEMI-ARTHROPLASTY Left 03/25/2014   Procedure: LEFT SHOULDER HEMI-ARTHROPLASTY;  Surgeon: Vickki Hearing, MD;  Location: [facility];  Service: Orthopedics;  Laterality: Left;   HPI:  83yo female admitted 05/08/23 with weakness, AMS following police wellness visit. PMH: HFrEF, renal insuff, HTN, gout, longterm use of opiate analgesic, chronic low back pain, HLD, anxiety, COPD, GERD, AKI, DM, CKD4, sCHF. CXR = worsening  airspace dz L>RLL. HeadCT = no acute finding    Assessment / Plan / Recommendation  Clinical Impression  Pt currently unsafe for PO intake, given lack of oral awareness or bolus manipulation, as well as confusion and inability to follow verbal commands consistently.   SLP completed oral care with suction, and cleaned upper denture and lower partial that son had just brought in. Pt tolerated placement of lower partial, but was unable to tolerate placement of upper denture, and was unable to place it herself. Pt noted to gag with attempts at placement of upper denture. Both were placed in fresh water in denture cup and left at sink.   Pt was given ice chip x2. No anterior leakage of ice chip or water, however, no oral manipulation was observed. Delayed swallow reflex elicited after initial ice chip melted. Pt noted to exhibit grimacing when swallowing, and stated pain with swallow (unable to rate). No swallow reflex after second ice chip given. Oral cavity was suctioned to remove remaining ice/water. No further PO trials given at this time.   Recommend strict NPO at this time. Pt currently with no source of nutrition. Will continue to follow pt to determine readiness for PO intake. RN, DO, and son informed.  SLP Visit Diagnosis: Dysphagia, unspecified (R13.10)    Aspiration Risk  Severe aspiration risk;Risk for inadequate nutrition/hydration    Diet Recommendation NPO    Medication Administration: Via alternative means  Other  Recommendations Oral Care Recommendations: Oral care QID Caregiver Recommendations: Have oral suction available    Recommendations for follow up therapy are one component of a multi-disciplinary discharge planning process, led by the attending physician.  Recommendations may be updated based on patient status, additional functional criteria and insurance authorization.  Follow up Recommendations Other (comment) (TBD)      Assistance Recommended at Discharge  TBD -  pt's son reports she lived independently prior to admit  Functional Status Assessment Patient has had a recent decline in their functional status and demonstrates the ability to make significant improvements in function in a reasonable and predictable amount of time.   Frequency and Duration min 1 x/week  1 week;2 weeks       Prognosis Prognosis for improved oropharyngeal function: Fair Barriers to Reach Goals: Cognitive deficits      Swallow Study   General Date of Onset: 05/08/23 HPI: 83yo female admitted 05/08/23 with weakness, AMS following police wellness visit. PMH: HFrEF, renal insuff, HTN, gout, longterm use of opiate analgesic, chronic low back pain, HLD, anxiety, COPD, GERD, AKI, DM, CKD4, sCHF. CXR = worsening airspace dz L>RLL. HeadCT = no acute finding Type of Study: Bedside Swallow Evaluation Previous Swallow Assessment: none Diet Prior to this Study: NPO Temperature Spikes Noted: No Respiratory Status: Nasal cannula History of Recent Intubation: No Behavior/Cognition: Alert;Confused;Doesn't follow directions;Requires cueing Oral Cavity Assessment: Within Functional Limits Oral Care Completed by SLP: Yes (with suction) Oral Cavity - Dentition: Dentures, top;Missing dentition;Other (Comment) (lower partial) Self-Feeding Abilities: Total assist Patient Positioning: Upright in bed Baseline Vocal Quality: Normal Volitional Cough: Cognitively unable to elicit Volitional Swallow: Unable to elicit    Oral/Motor/Sensory Function Overall Oral Motor/Sensory Function: Generalized oral weakness (unable to formally assess due to difficulty following directions)   Ice Chips Ice chips: Impaired Presentation: Spoon Oral Phase Impairments: Poor awareness of bolus Oral Phase Functional Implications: Oral holding;Other (comment) (no oral manipulation. oral cavity suctioned) Pharyngeal Phase Impairments: Unable to trigger swallow   Thin Liquid Thin Liquid: Not tested    Nectar Thick  Nectar Thick Liquid: Not tested   Honey Thick Honey Thick Liquid: Not tested   Puree Puree: Not tested   Solid     Solid: Not tested     Margaretann Abate B. Murvin Natal, [facility], CCC-SLP Speech Language Pathologist Office: [phone]  Leigh Aurora 05/10/2023,1:22 PM

## 2023-05-10 NOTE — Evaluation (Signed)
 Occupational Therapy Evaluation Patient Details Name: Heather [name] MRN: [id number] DOB: [birth year] Today's Date: 05/10/2023   History of Present Illness   83 y/o woman admitted from [facility] 3/4 with confusion, hypoglycemia, CAP and shock after wellfare check. PMhx: HFrEF, HTN, COPD, DM     Clinical Impressions This 83 yo female admitted with above presents to acute OT with PLOF of pt being able to do her own basic ADLs, most IADLs, and drive. Currently she is CGA-total A with basic ADLs due to confusion, decreased balance, and asterixis in Bil UEs. She will continue to benefit from acute OT with follow up from continued inpatient follow up therapy, <3 hours/day.      If plan is discharge home, recommend the following:   A lot of help with walking and/or transfers;A lot of help with bathing/dressing/bathroom;Assistance with cooking/housework;Assistance with feeding;Help with stairs or ramp for entrance;Assist for transportation;Other (comment);Supervision due to cognitive status     Functional Status Assessment   Patient has had a recent decline in their functional status and demonstrates the ability to make significant improvements in function in a reasonable and predictable amount of time.     Equipment Recommendations   Wheelchair cushion (measurements OT);Wheelchair (measurements OT)      Precautions/Restrictions   Precautions Precautions: Fall Restrictions Weight Bearing Restrictions Per Provider Order: No     Mobility Bed Mobility Overal bed mobility: Needs Assistance Bed Mobility: Supine to Sit     Supine to sit: Contact guard, HOB elevated, Used rails          Transfers Overall transfer level: Needs assistance Equipment used: 1 person hand held assist Transfers: Sit to/from Stand Sit to Stand: Min assist                  Balance Overall balance assessment: Needs assistance Sitting-balance support: No upper extremity supported, Feet  supported Sitting balance-Leahy Scale: Fair     Standing balance support: Single extremity supported Standing balance-Leahy Scale: Poor                             ADL either performed or assessed with clinical judgement   ADL Overall ADL's : Needs assistance/impaired Eating/Feeding: Total assistance;NPO   Grooming: Wash/dry face;Contact guard assist;Sitting Grooming Details (indicate cue type and reason): EOB, pt with Bil UE asterixis Upper Body Bathing: Contact guard assist;Sitting Upper Body Bathing Details (indicate cue type and reason): EOB, pt with Bil UE asterixis Lower Body Bathing: Moderate assistance Lower Body Bathing Details (indicate cue type and reason): min A sit<>stand and to maintain standing Upper Body Dressing : Moderate assistance;Sitting Upper Body Dressing Details (indicate cue type and reason): EOB, pt with Bil UE asterixis Lower Body Dressing: Maximal assistance Lower Body Dressing Details (indicate cue type and reason): min A sit<>stand and to maintain standing Toilet Transfer: Minimal assistance Toilet Transfer Details (indicate cue type and reason): simulated side steps up to [facility] Toileting- Clothing Manipulation and Hygiene: Total assistance Toileting - Clothing Manipulation Details (indicate cue type and reason): min A sit<>stand and to maintain standing             Vision Baseline Vision/History: 1 Wears glasses Ability to See in Adequate Light: 0 Adequate Patient Visual Report: No change from baseline              Pertinent Vitals/Pain Pain Assessment Pain Assessment: Faces Pain Score: 0-No pain Faces Pain Scale: No hurt  Extremity/Trunk Assessment Upper Extremity Assessment Upper Extremity Assessment: Generalized weakness (asterixis at rest and with activity)      Communication Communication Communication: No apparent difficulties   Cognition Arousal: Alert Behavior During Therapy: Flat affect Cognition:  Cognition impaired   Orientation impairments: Place, Time, Situation Awareness: Intellectual awareness impaired, Online awareness impaired Memory impairment (select all impairments): Working Civil Service fast streamer, Conservation officer, historic buildings Attention impairment (select first level of impairment): Sustained attention Executive functioning impairment (select all impairments): Organization, Sequencing, Reasoning, Problem solving                   Following commands: Impaired Following commands impaired: Follows one step commands inconsistently, Follows one step commands with increased time     Cueing   Cueing Techniques: Verbal cues;Gestural cues;Tactile cues;Visual cues              Home Living Family/patient expects to be discharged to:: Private residence Living Arrangements: Alone Available Help at Discharge: Family;Available PRN/intermittently Type of Home: Apartment Home Access: Level entry     Home Layout: One level     Bathroom Shower/Tub: Producer, television/film/video: Handicapped height     Home Equipment: Shower seat;BSC/3in1;Cane - single Librarian, academic (2 wheels)   Additional Comments: Son, [name] in room and confirms above information      Prior Functioning/Environment Prior Level of Function : Independent/Modified Independent;Driving             Mobility Comments: per son pt normally does not use anything for ambulation ADLs Comments: Per son pt can do all her own ADLs, and most IADLs    OT Problem List: Impaired balance (sitting and/or standing);Decreased coordination;Decreased cognition;Decreased safety awareness;Impaired UE functional use   OT Treatment/Interventions: Self-care/ADL training;Patient/family education;DME and/or AE instruction;Balance training      OT Goals(Current goals can be found in the care plan section)   Acute Rehab OT Goals Patient Stated Goal: agreeable to get out of the bed OT Goal Formulation: Patient unable  to participate in goal setting Time For Goal Achievement: 05/24/23 Potential to Achieve Goals: Good   OT Frequency:  Min 2X/week       AM-PAC OT "6 Clicks" Daily Activity     Outcome Measure Help from another person eating meals?: Total Help from another person taking care of personal grooming?: A Lot Help from another person toileting, which includes using toliet, bedpan, or urinal?: A Lot Help from another person bathing (including washing, rinsing, drying)?: A Lot Help from another person to put on and taking off regular upper body clothing?: A Lot Help from another person to put on and taking off regular lower body clothing?: Total 6 Click Score: 10   End of Session Nurse Communication: Mobility status (secure chat)  Activity Tolerance:  (limited by confusion and asterixis) Patient left: in bed;with call bell/phone within reach;with bed alarm set  OT Visit Diagnosis: Unsteadiness on feet (R26.81);Other abnormalities of gait and mobility (R26.89);Muscle weakness (generalized) (M62.81);Other symptoms and signs involving cognitive function                Time: 1345-1411 OT Time Calculation (min): 26 min Charges:  OT General Charges $OT Visit: 1 Visit OT Evaluation $OT Eval Moderate Complexity: 1 Mod OT Treatments $Self Care/Home Management : 8-22 mins  Lindon Romp OT Acute Rehabilitation Services Office [phone]    Evette Georges 05/10/2023, 2:30 PM

## 2023-05-10 NOTE — TOC Initial Note (Signed)
 Transition of Care [facility]) - Initial/Assessment Note    Patient Details  Name: Heather [name] MRN: [id number] Date of Birth: 1940-04-27  Transition of Care [facility]) CM/SW Contact:    Marliss Coots, LCSW Phone Number: 05/10/2023, 11:54 AM  Clinical Narrative:                  11:55 AM CSW called patient's son (patient disoriented x3), [name] to introduce herself and inform him of physical therapy recommendation of discharge to SNF. [name] was agreeable to recommendation and consented CSW to send referrals to SNFs in [place] and [place] counties.  Expected Discharge Plan: Skilled Nursing Facility Barriers to Discharge: Continued Medical Work up, SNF Pending bed offer   Patient Goals and CMS Choice            Expected Discharge Plan and Services In-house Referral: Clinical Social Work   Post Acute Care Choice: Skilled Nursing Facility Living arrangements for the past 2 months: Apartment                                      Prior Living Arrangements/Services Living arrangements for the past 2 months: Apartment Lives with:: Self Patient language and need for interpreter reviewed:: Yes        Need for Family Participation in Patient Care: Yes (Comment) Care giver support system in place?: Yes (comment)   Criminal Activity/Legal Involvement Pertinent to Current Situation/Hospitalization: No - Comment as needed  Activities of Daily Living      Permission Sought/Granted Permission sought to share information with : Facility Medical sales representative, Family Supports Permission granted to share information with : No (Contact information on chart)  Share Information with NAME: Alanson Puls  Permission granted to share info w AGENCY: SNF  Permission granted to share info w Relationship: Son  Permission granted to share info w Contact Information: [phone]  Emotional Assessment   Attitude/Demeanor/Rapport: Unable to Assess Affect (typically observed):  Unable to Assess Orientation: : Oriented to Self Alcohol / Substance Use: Not Applicable Psych Involvement: No (comment)  Admission diagnosis:  Hypoglycemia [E16.2] AKI (acute kidney injury) (HCC) [N17.9] Elevated lactic acid level [R79.89] Acute sepsis [facility]) [A41.9] Patient Active Problem List   Diagnosis Date Noted   Acute sepsis (HCC) 05/08/2023   Cardiogenic shock (HCC) 05/08/2023   Primary insomnia 01/15/2023   Candidal vaginitis 01/15/2023   Need for influenza vaccination 12/01/2022   Type 2 diabetes mellitus with diabetic chronic kidney disease (HCC) 08/31/2022   Breast cancer screening by mammogram 08/31/2022   LVF (left ventricular failure) (HCC) 08/18/2022   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) 08/18/2022   NICM (nonischemic cardiomyopathy) (HCC) 08/18/2022   Chronic systolic HF (heart failure) (HCC) 05/26/2022   Acute on chronic systolic CHF (congestive heart failure) (HCC) 03/13/2022   Prolonged QT interval 03/13/2022   Bloating 02/02/2022   History of cerebellar stroke 11/04/2021   OSA on CPAP 11/04/2021   Acute systolic CHF (congestive heart failure) (HCC) 10/09/2021   SOBOE (shortness of breath on exertion) 10/07/2021   Hyperkalemia 10/07/2021   Adenomatous polyp of cecum 07/14/2021   Leg swelling 01/31/2021   Acute kidney injury (HCC) 12/14/2020   Tremor 06/21/2020   DOE (dyspnea on exertion) 05/25/2020   Chronic kidney disease, stage IV (severe) (HCC) 04/09/2020   Leg edema 02/03/2020   AKI (acute kidney injury) (HCC) 01/16/2020   Prediabetes 01/16/2020   Preventative  health care 01/13/2020   Hyperlipidemia 01/13/2020   Anxiety 01/13/2020   COPD GOLD 1  01/13/2020   Gastroesophageal reflux disease 01/13/2020   Intertrigo 01/13/2020   Long-term current use of opiate analgesic 07/07/2019   Sacroiliac joint pain 07/07/2019   Bilateral carpal tunnel syndrome 05/10/2019   Chronic low back pain 05/10/2019   Lumbosacral  radiculopathy 05/10/2019   Polyarthropathy 05/10/2019   Gout 10/24/2018   Essential hypertension 04/17/2014   Renal insufficiency 04/01/2014   Rotator cuff arthropathy    PCP:  [name], MD Pharmacy:   [facility] - [place], [place] - [address] [place] [address] Phone: [phone] Fax: [phone]  [facility] - [place], [place] - [address] [place] [address] Phone: [phone] Fax: [phone]  [facility] [address] [place] [address] Phone: [phone] Fax: [phone]     Social Drivers of Health (SDOH) Social History: SDOH Screenings   Food Insecurity: Patient Declined (05/09/2023)  Housing: Patient Declined (05/09/2023)  Transportation Needs: Patient Unable To Answer (05/09/2023)  Utilities: Patient Unable To Answer (05/09/2023)  Alcohol Screen: Low Risk  (11/27/2022)  Depression (PHQ2-9): Low Risk  (05/02/2023)  Financial Resource Strain: Medium Risk (11/27/2022)  Physical Activity: Insufficiently Active (11/27/2022)  Social Connections: Patient Unable To Answer (05/09/2023)  Stress: Stress Concern Present (11/27/2022)  Tobacco Use: Medium Risk (05/08/2023)  Health Literacy: Adequate Health Literacy (11/09/2022)   SDOH Interventions:     Readmission Risk Interventions     No data to display

## 2023-05-11 ENCOUNTER — Inpatient Hospital Stay (HOSPITAL_COMMUNITY)

## 2023-05-11 DIAGNOSIS — A419 Sepsis, unspecified organism: Secondary | ICD-10-CM | POA: Diagnosis not present

## 2023-05-11 DIAGNOSIS — N184 Chronic kidney disease, stage 4 (severe): Secondary | ICD-10-CM | POA: Diagnosis not present

## 2023-05-11 DIAGNOSIS — I5023 Acute on chronic systolic (congestive) heart failure: Secondary | ICD-10-CM | POA: Diagnosis not present

## 2023-05-11 DIAGNOSIS — E119 Type 2 diabetes mellitus without complications: Secondary | ICD-10-CM

## 2023-05-11 DIAGNOSIS — R570 Cardiogenic shock: Secondary | ICD-10-CM | POA: Diagnosis not present

## 2023-05-11 LAB — CBC
HCT: 30.3 % — ABNORMAL LOW (ref 36.0–46.0)
Hemoglobin: 10.6 g/dL — ABNORMAL LOW (ref 12.0–15.0)
MCH: 32.2 pg (ref 26.0–34.0)
MCHC: 35 g/dL (ref 30.0–36.0)
MCV: 92.1 fL (ref 80.0–100.0)
Platelets: 53 10*3/uL — ABNORMAL LOW (ref 150–400)
RBC: 3.29 MIL/uL — ABNORMAL LOW (ref 3.87–5.11)
RDW: 15.6 % — ABNORMAL HIGH (ref 11.5–15.5)
WBC: 11.3 10*3/uL — ABNORMAL HIGH (ref 4.0–10.5)
nRBC: 0.3 % — ABNORMAL HIGH (ref 0.0–0.2)

## 2023-05-11 LAB — HEPATIC FUNCTION PANEL
ALT: 1914 U/L — ABNORMAL HIGH (ref 0–44)
AST: 1410 U/L — ABNORMAL HIGH (ref 15–41)
Albumin: 2.6 g/dL — ABNORMAL LOW (ref 3.5–5.0)
Alkaline Phosphatase: 160 U/L — ABNORMAL HIGH (ref 38–126)
Bilirubin, Direct: 2.1 mg/dL — ABNORMAL HIGH (ref 0.0–0.2)
Indirect Bilirubin: 1.6 mg/dL — ABNORMAL HIGH (ref 0.3–0.9)
Total Bilirubin: 3.7 mg/dL — ABNORMAL HIGH (ref 0.0–1.2)
Total Protein: 4.4 g/dL — ABNORMAL LOW (ref 6.5–8.1)

## 2023-05-11 LAB — BASIC METABOLIC PANEL
Anion gap: 11 (ref 5–15)
BUN: 116 mg/dL — ABNORMAL HIGH (ref 8–23)
CO2: 25 mmol/L (ref 22–32)
Calcium: 8.5 mg/dL — ABNORMAL LOW (ref 8.9–10.3)
Chloride: 107 mmol/L (ref 98–111)
Creatinine, Ser: 4.24 mg/dL — ABNORMAL HIGH (ref 0.44–1.00)
GFR, Estimated: 10 mL/min — ABNORMAL LOW (ref >60)
Glucose, Bld: 90 mg/dL (ref 70–99)
Potassium: 3.5 mmol/L (ref 3.5–5.1)
Sodium: 143 mmol/L (ref 135–145)

## 2023-05-11 LAB — GLUCOSE, CAPILLARY
Glucose-Capillary: 104 mg/dL — ABNORMAL HIGH (ref 70–99)
Glucose-Capillary: 83 mg/dL (ref 70–99)
Glucose-Capillary: 88 mg/dL (ref 70–99)
Glucose-Capillary: 97 mg/dL (ref 70–99)
Glucose-Capillary: 107 mg/dL — ABNORMAL HIGH (ref 70–99)
Glucose-Capillary: 144 mg/dL — ABNORMAL HIGH (ref 70–99)

## 2023-05-11 LAB — COOXEMETRY PANEL
Carboxyhemoglobin: 1.8 % — ABNORMAL HIGH (ref 0.5–1.5)
Methemoglobin: <0.7 % (ref 0.0–1.5)
O2 Saturation: 84.7 %
Total hemoglobin: 9.9 g/dL — ABNORMAL LOW (ref 12.0–16.0)

## 2023-05-11 LAB — PHOSPHORUS: Phosphorus: 4.5 mg/dL (ref 2.5–4.6)

## 2023-05-11 LAB — ABO/RH: ABO/RH(D): A POS

## 2023-05-11 LAB — MAGNESIUM: Magnesium: 2.2 mg/dL (ref 1.7–2.4)

## 2023-05-11 LAB — PROTIME-INR
INR: 2.5 — ABNORMAL HIGH (ref 0.8–1.2)
Prothrombin Time: 27.4 s — ABNORMAL HIGH (ref 11.4–15.2)

## 2023-05-11 MED ORDER — POLYETHYLENE GLYCOL 3350 17 G PO PACK: 17.0000 g | PACK | Freq: Every day | ORAL | Status: DC

## 2023-05-11 MED ORDER — POTASSIUM CHLORIDE CRYS ER 20 MEQ PO TBCR
40.0000 meq | EXTENDED_RELEASE_TABLET | Freq: Once | ORAL | Status: DC
  Filled 2023-05-11: qty 2

## 2023-05-11 MED ORDER — SODIUM CHLORIDE 0.9% IV SOLUTION: Freq: Once | INTRAVENOUS | Status: AC

## 2023-05-11 MED ORDER — DIPHENHYDRAMINE HCL 12.5 MG/5ML PO ELIX: 25.0000 mg | ORAL_SOLUTION | Freq: Four times a day (QID) | ORAL | Status: DC | PRN

## 2023-05-11 MED ORDER — POLYETHYLENE GLYCOL 3350 17 G PO PACK
17.0000 g | PACK | Freq: Every day | ORAL | Status: DC
  Administered 2023-05-12 – 2023-05-13 (×2): 17 g
  Filled 2023-05-11 (×2): qty 1

## 2023-05-11 MED ORDER — FAMOTIDINE 20 MG PO TABS
10.0000 mg | ORAL_TABLET | Freq: Every day | ORAL | Status: DC
  Administered 2023-05-12 – 2023-05-13 (×2): 10 mg
  Filled 2023-05-11 (×2): qty 1

## 2023-05-11 MED ORDER — HEPARIN SODIUM (PORCINE) 5000 UNIT/ML IJ SOLN
5000.0000 [IU] | Freq: Three times a day (TID) | INTRAMUSCULAR | Status: DC
  Administered 2023-05-11 – 2023-05-13 (×6): 5000 [IU] via SUBCUTANEOUS
  Filled 2023-05-11 (×6): qty 1

## 2023-05-11 MED ORDER — THIAMINE MONONITRATE 100 MG PO TABS
100.0000 mg | ORAL_TABLET | Freq: Every day | ORAL | Status: DC
  Administered 2023-05-11 – 2023-05-13 (×3): 100 mg
  Filled 2023-05-11 (×3): qty 1

## 2023-05-11 MED ORDER — MELATONIN 3 MG PO TABS
3.0000 mg | ORAL_TABLET | Freq: Every day | ORAL | Status: DC
  Administered 2023-05-11: 3 mg via ORAL
  Filled 2023-05-11 (×2): qty 1

## 2023-05-11 MED ORDER — PROSOURCE TF20 ENFIT COMPATIBL EN LIQD
60.0000 mL | Freq: Every day | ENTERAL | Status: DC
  Administered 2023-05-11 – 2023-05-16 (×6): 60 mL
  Filled 2023-05-11 (×6): qty 60

## 2023-05-11 MED ORDER — DIPHENHYDRAMINE HCL 12.5 MG/5ML PO ELIX
25.0000 mg | ORAL_SOLUTION | Freq: Four times a day (QID) | ORAL | Status: DC | PRN
  Administered 2023-05-11: 25 mg via ORAL
  Filled 2023-05-11 (×2): qty 10

## 2023-05-11 MED ORDER — QUETIAPINE FUMARATE 25 MG PO TABS: 25.0000 mg | ORAL_TABLET | Freq: Once | ORAL | Status: DC

## 2023-05-11 MED ORDER — DOCUSATE SODIUM 50 MG/5ML PO LIQD
100.0000 mg | Freq: Two times a day (BID) | ORAL | Status: DC | PRN
Start: 2023-05-11 — End: 2023-05-13

## 2023-05-11 MED ORDER — OSMOLITE 1.5 CAL PO LIQD
1000.0000 mL | ORAL | Status: DC
Start: 2023-05-11 — End: 2023-05-14
  Administered 2023-05-11: 1000 mL
  Filled 2023-05-11 (×4): qty 1000

## 2023-05-11 NOTE — Procedures (Addendum)
 Cortrak  Person Inserting Tube:  Khiya Friese T, RD Tube Type:  Cortrak - 43 inches Tube Size:  10 Tube Location:  Right nare Secured by: Bridle Technique Used to Measure Tube Placement:  Marking at nare/corner of mouth Cortrak Secured At:  60 cm   Cortrak Tube Team Note:  Consult received to place a Cortrak feeding tube.   INR was 2.5. Messaged attending Dr. Delton Coombes and FFP ordered. Infusing at time of Cortrak placement as discussed with attending and resident.  X-ray ordered by the Cortrak team. Please confirm placement before use.  If the tube becomes dislodged please keep the tube and contact the Cortrak team at [email or web address] for replacement.  If after hours and replacement cannot be delayed, place a NG tube and confirm placement with an abdominal x-ray.    Shelle Iron RD, LDN Contact via [employer].

## 2023-05-11 NOTE — Progress Notes (Signed)
 eLink Physician-Brief Progress Note Patient Name: Heather [name] DOB: 07/06/1940 MRN: [id number]   Date of Service  05/11/2023  HPI/Events of Note  Pt sundowing/confused. With cortrak in place.  Qtc 600, AST/ALT >1000, Cr 4.2  eICU Interventions  Will give melatonin for now for sleep.  Trial of diphenhydramine PRN also ordered. Will continue to monitor closely.         Vernard Gram M DELA CRUZ 05/11/2023, 8:25 PM

## 2023-05-11 NOTE — Progress Notes (Signed)
 SLP Cancellation Note  Patient Details Name: Heather [name] MRN: [id number] DOB: 17-Mar-1940   Cancelled treatment:       Reason Eval/Treat Not Completed: Medical issues which prohibited therapy. Consulted with DO who requests recheck later today. Will continue efforts.  Tiara Maultsby B. Murvin Natal, [facility], CCC-SLP Speech Language Pathologist Office: [phone]  Heather [name] 05/11/2023, 8:49 AM

## 2023-05-11 NOTE — Progress Notes (Addendum)
 NAME:  Heather [name], MRN:  [id number], DOB:  Oct 16, 1940, LOS: 3 ADMISSION DATE:  05/08/2023, CONSULTATION DATE:  05/08/2023 REFERRING MD:  Charm Barges, CHIEF COMPLAINT:  Weakness and AMS   History of Present Illness:  Heather [name] is a 83 y.o. female who has a PMH as below. Police was called 3/4 for a wellness visit in her apartment because she was unreachable by phone from her son. Upon their arrival, she was found to have AMS. She was last known well the day prior. EMS was called and upon their arrival, CBG was 40 and she was given IM glucagon before being transported to [facility] ED.    In ED, initial CBG too low to pickup. ABG with profound metabolic acidosis (6.97/54/42). Workup also noteable for AKI with SCr 4.71/BUN 99, AG 24, ALT 1762, trop 245, lactate > 9, WBC 12.4. Tmax 100.5 rectally, BP and HR normal. UA unremarkable. CT head and CT chest/abd/pelv pending.   Due to her multiple metabolic derangements, PCCM called for transfer to [facility] ICU.   Of note, cMRI in Jan 2024 showed LVEF 16%, mild apical inferolateral subendocardial LGE, mod dilated RV with RVEF 23%. Echo from April 2024 showed EF < 20% with mod LV enlargement, septal lateral dyssynchrony, mod RV dysfunction, PASP 65. She was referred to EP and underwent [employer] CRT-P placement 5/24. Echo 9/24, EF 20-25%, mild LV dilation, moderate RV dysfunction with normal size. On arrival at [facility] BG was 99.  Pertinent  Medical History  Rotator cuff arthropathy; Renal insufficiency; Essential hypertension; Gout; Long-term current use of opiate analgesic; Bilateral carpal tunnel syndrome; Chronic low back pain; Lumbosacral radiculopathy; Polyarthropathy; Sacroiliac joint pain; Preventative health care; Hyperlipidemia; Anxiety; COPD GOLD 1 ; Gastroesophageal reflux disease; Intertrigo; AKI (acute kidney injury) (HCC); Prediabetes; Leg edema; DOE (dyspnea on exertion); Chronic kidney disease, stage IV (severe) (HCC); Tremor; Acute kidney injury (HCC); Leg  swelling; SOBOE (shortness of breath on exertion); Hyperkalemia; Acute systolic CHF (congestive heart failure) (HCC); History of cerebellar stroke; OSA on CPAP; Bloating; Acute on chronic systolic CHF (congestive heart failure) (HCC); Prolonged QT interval; Chronic systolic HF (heart failure) (HCC); LVF (left ventricular failure) (HCC); 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 Started on IV Milrinone and IV Bicarb. ECHO ordered 3/5 Stopped Vanc. MRSA swab negative. De-escalate cefepime to rocephin. Stopped Bicarb infusion.  3/7 Stopped Levophed, cortrak placed.   Interim History / Subjective:  No overnight events - Complaining of a headache and throat pain   Objective   Blood pressure (!) 110/57, pulse 75, temperature 98.3 F (36.8 C), temperature source Oral, resp. rate 19, height 5\' 2"  (1.575 m), weight 72.8 kg, SpO2 90%. CVP:  [7 mmHg-19 mmHg] 7 mmHg      Intake/Output Summary (Last 24 hours) at 05/11/2023 1053 Last data filed at 05/11/2023 0600 Gross per 24 hour  Intake 940.2 ml  Output 2035 ml  Net -1094.8 ml   Filed Weights   05/09/23 0500 05/10/23 0500 05/11/23 0500  Weight: 73.6 kg 73.6 kg 72.8 kg    Examination: General: Appears chronically ill, laying in bed, no  acute distress  Neuro: Awake, alert and oriented to self. Appears confused. Slow to answer questions, follows commands   HENT: /AT. Dry MM  Lungs: Breathing comfortable, RLL crackles, no wheezing   Cardiovascular: RR.  Abdomen:  +Epigastric tenderness  Extremities: warm, No LE edema  GU: Not assessed.   Pertinent Imaging: CTH : No acute CT finding. Age related volume loss. Mild chronic small-vessel ischemic change of the white matter CT CAP: Dependent atelectasis/consolidation in both lower lobes. 7 mm right middle lobe nodule. Right Spigelian hernia containing only fat.  CXR: Cardiomegaly. Worsening airspace disease at the left > right lung base which may be due to atelectasis or pneumonia. Possible small left effusion. ECHO today: Global hypokinesis worse apex and septum with akinesis. LVEF 25% with regional wall motion abnormalities. Severely enlarged LV. G I DD. RV function normal.   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  - IV Milrinone to increase peripheral perfusion and cardiac contractility - Serial Co-ox and CVP  - Continue home Amiodarone  - Holding home meds: Bisoprolol 5 mg daily, lasix 40 mg daily, hydralazine 25 mg TID, IMDUR 15 mg daily, metolazone 2.5 mg daily.    Septic shock Bilateral lower lobe pneumonia - ABX IV Doxy 4/7 and Rocephin 3/6  - Follow up sputum culture - Blood cultures 3/4 - no growth   Severe High Anion Gap Metabolic Acidosis Lactic acidosis - improved   - S/p Bicarb infusion 3/4-3/5  - Improved lactic acid 3.0 (9.0) and AG 11  Acute Hypoxic Respiratory Failure  Odynophagia  Hx of COPD - Supplemental O2 as needed, Goal 88-92%  - Bronchodilators with Budesonide/Brovana/Yupelri, takes Trelegy at  home - Chloraseptic spray - Follow up on sputum analysis   Shock Liver - Holding home statin - AST 1410/AST 1914, improving  - Trend LFTs  - maintain adequate perfusion; eventually needs diuresis   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 -PT, OT, OOB mobility -frequent reorintation, family has been at bedside -avoid deliriogenc meds   Coagulopathy, s/p vit K - INR 2.5 (3.9) - Will give 1 unit FFP - con't to monitor   Thrombocytopenia - PLT 53 (73) - no current need for transfusion - Restart DVT ptop.    Hypoglycemia, likely from AKI with home meds  H/o DM2 -hold PTA oral hypoglycemics  -not needing insulin, plus PO intake has been low   Poor Nutrition  Dysphagia - Cortrak today  - tube feeds  RML 7mm nodule -needs OP follow up  Lumbar Radiculitis Chronic Pain syndrome - Hold home medication Effexor 75 mg  - Hold home medication PRN Norco/Vicodin 5-325 q6h  Best Practice (right click and "Reselect all SmartList Selections" daily)   Diet/type: NPO Cortrak, Tube feeds DVT prophylaxis: DVT Prophylaxis  Pressure ulcer(s): Assessed by RN GI prophylaxis: H2B Lines: RIJ and peripheral  Foley:  Yes, and it is still  needed Code Status:  full code Last date of multidisciplinary goals of care discussion [will update]  Labs   CBC: Recent Labs  Lab 05/08/23 1155 05/08/23 1212 05/08/23 1805 05/09/23 0536 05/10/23 0344 05/11/23 0516  WBC 12.4*  --  12.3* 12.1* 11.6* 11.3*  NEUTROABS 11.0*  --   --   --   --   --   HGB 11.4* 13.6 10.9* 9.7* 10.6* 10.6*  HCT 36.8 40.0 33.5* 28.6* 30.1* 30.3*  MCV 104.8*  --  100.3* 95.0 91.5 92.1  PLT 132*  --  113* 82* 73* 53*    Basic Metabolic Panel: Recent Labs  Lab 05/08/23 1155 05/08/23 1212 05/08/23 1805  05/09/23 0428 05/10/23 0344 05/11/23 0516  NA 135 136 140 140 139 143  K 4.9 4.8 4.4 4.0 3.3* 3.5  CL 98 105 102 101 100 107  CO2 13*  --  15* 22 23 25   GLUCOSE 291* 251* 115* 149* 96 90  BUN 99* 102* 102* 119* 119* 116*  CREATININE 4.71* 4.60* 4.82* 4.84* 4.87* 4.24*  CALCIUM 8.7*  --  8.3* 8.0* 8.1* 8.5*  MG  --   --   --  2.3 2.3 2.2  PHOS  --   --   --  7.5* 5.5* 4.5   GFR: Estimated Creatinine Clearance: 9.7 mL/min (A) (by C-G formula based on SCr of 4.24 mg/dL (H)). Recent Labs  Lab 05/08/23 1155 05/08/23 1349 05/08/23 1512 05/08/23 1805 05/09/23 0536 05/09/23 0650 05/10/23 0344 05/11/23 0516  PROCALCITON  --   --   --  2.21  --   --   --   --   WBC 12.4*  --   --  12.3* 12.1*  --  11.6* 11.3*  LATICACIDVEN >9.0* >9.0* >9.0*  --   --  3.0*  --   --     Liver Function Tests: Recent Labs  Lab 05/08/23 1155 05/08/23 1805 05/09/23 0428 05/10/23 0344 05/11/23 0516  AST 2,396* 4,662* 4,931* 2,916* 1,410*  ALT 1,762* 2,548* 2,675* 2,618* 1,914*  ALKPHOS 205* 202* 163* 163* 160*  BILITOT 2.9* 3.6* 3.7* 3.9* 3.7*  PROT 5.7* 5.2* 4.6* 4.5* 4.4*  ALBUMIN 2.9* 2.7* 2.7* 2.6* 2.6*   No results for input(s): "LIPASE", "AMYLASE" in the last 168 hours. Recent Labs  Lab 05/10/23 0344  AMMONIA 19    ABG    Component Value Date/Time   PHART 7.382 10/27/2021 0906   PCO2ART 39.8 10/27/2021 0906   PO2ART 70 (L) 10/27/2021 0906   HCO3 12.2 (L) 05/08/2023 1247   TCO2 16 (L) 05/08/2023 1212   ACIDBASEDEF 19.2 (H) 05/08/2023 1247   O2SAT 84.7 05/11/2023 0515     Coagulation Profile: Recent Labs  Lab 05/08/23 1155 05/09/23 0428 05/10/23 0344 05/11/23 0516  INR 3.2* 4.8* 3.9* 2.5*    Cardiac Enzymes: Recent Labs  Lab 05/08/23 1355  CKTOTAL 345*    HbA1C: Hgb A1c MFr Bld  Date/Time Value Ref Range Status  03/09/2023 10:18 AM 6.0 (H) 4.8 - 5.6 % Final    Comment:             Prediabetes: 5.7 - 6.4          Diabetes: >6.4          Glycemic control for  adults with diabetes: <7.0   05/26/2022 03:46 PM 6.5 (H) 4.8 - 5.6 % Final    Comment:             Prediabetes: 5.7 - 6.4  Diabetes: >6.4          Glycemic control for adults with diabetes: <7.0     CBG: Recent Labs  Lab 05/10/23 1643 05/10/23 1933 05/10/23 2325 05/11/23 0326 05/11/23 0733  GLUCAP 85 80 94 104* 83    Review of Systems:   As above   Past Medical History:  She,  has a past medical history of Anemia, Anxiety, Arthritis, COPD (chronic obstructive pulmonary disease) (HCC), CVA (cerebral vascular accident) (HCC), Depression, Diabetes mellitus without complication (HCC), Dry eyes (04/14/2014), GERD (gastroesophageal reflux disease), Glaucoma, Gout, HTN (hypertension), and OSA (obstructive sleep apnea).   Surgical History:   Past Surgical History:  Procedure Laterality Date   ABDOMINAL HYSTERECTOMY     BACK SURGERY     lumbar-disc   BIV PACEMAKER INSERTION CRT-P N/A 07/12/2022   Procedure: BIV PACEMAKER INSERTION CRT-P;  Surgeon: Duke Salvia, MD;  Location: [facility] INVASIVE CV LAB;  Service: Cardiovascular;  Laterality: N/A;   BUNIONECTOMY Bilateral    FOOT SURGERY Left    repair of "kissing Cousins"   JOINT REPLACEMENT Bilateral    hip    JOINT REPLACEMENT Right 2010 or 2012   knee   KNEE SURGERY     RIGHT/LEFT HEART CATH AND CORONARY ANGIOGRAPHY N/A 10/27/2021   Procedure: RIGHT/LEFT HEART CATH AND CORONARY ANGIOGRAPHY;  Surgeon: Corky Crafts, MD;  Location: [facility] INVASIVE CV LAB;  Service: Cardiovascular;  Laterality: N/A;   SHOULDER ARTHROSCOPY Right    shoulder surgery in [place] about 2000   SHOULDER HEMI-ARTHROPLASTY Left 03/25/2014   Procedure: LEFT SHOULDER HEMI-ARTHROPLASTY;  Surgeon: Vickki Hearing, MD;  Location: [facility] ORS;  Service: Orthopedics;  Laterality: Left;     Social History:   reports that she quit smoking about 38 years ago. Her smoking use included cigarettes. She started smoking about 78 years ago. She has a 40 pack-year  smoking history. She has never used smokeless tobacco. She reports that she does not drink alcohol and does not use drugs.   Family History:  Her family history includes Aneurysm in her father; Congestive Heart Failure in her niece; Diabetes in her brother; High blood pressure in her mother; Hypertension in her niece; Pancreatitis in her brother.   Allergies Allergies  Allergen Reactions   Lovastatin Swelling    Patient states that her tongue swells and she gets a tingling feeling all over.   Lisinopril Swelling    Tongue swelling     Home Medications  Prior to Admission medications   Medication Sig Start Date End Date Taking? Authorizing Provider  acetaminophen (TYLENOL) 500 MG tablet Take 500 mg by mouth every 6 (six) hours as needed for mild pain or moderate pain.    [provider]  albuterol ([name]) 108 (90 Base) MCG/ACT inhaler Inhale 2 puffs into the lungs every 6 (six) hours as needed for wheezing or shortness of breath. 01/12/23   Glenford Bayley, NP  allopurinol (ZYLOPRIM) 300 MG tablet TAKE ONE-HALF TABLET BY MOUTH EVERY DAY 03/23/23   [name], MD  amiodarone (PACERONE) 200 MG tablet Take 1 tablet (200 mg total) by mouth 2 (two) times daily for 14 days, THEN 1 tablet (200 mg total) daily. Patient taking differently: Patient takes 1 tablet by mouth daily. 12/20/22 01/03/24  Laurey Morale, MD  aspirin (ASPIRIN CHILDRENS) 81 MG chewable tablet Chew 1 tablet (81 mg total) by mouth daily. 05/05/22   Mallipeddi, Vishnu P, MD  atorvastatin (LIPITOR) 20 MG tablet Take 1  tablet (20 mg total) by mouth daily. 07/03/22 01/08/24  Laurey Morale, MD  bisoprolol (ZEBETA) 5 MG tablet Take 1 tablet (5 mg total) by mouth daily. 11/27/22   Laurey Morale, MD  chlorpheniramine (CHLOR-TRIMETON) 4 MG tablet Take 4 mg by mouth daily as needed for allergies.    [provider]  Cholecalciferol (DIALYVITE VITAMIN D 5000 PO) Take 5,000 Units by mouth daily.     [provider]  empagliflozin (JARDIANCE) 10 MG TABS tablet Take 1 tablet (10 mg total) by mouth daily before breakfast. 03/22/23   Laurey Morale, MD  famotidine (PEPCID) 20 MG tablet Take 1 tablet (20 mg total) by mouth at bedtime. One after supper 10/10/21   Vassie Loll, MD  Fluticasone-Umeclidin-Vilant (TRELEGY ELLIPTA) 200-62.5-25 MCG/ACT AEPB Inhale 1 puff into the lungs daily. 03/09/23   [name], MD  furosemide (LASIX) 40 MG tablet Take 1 tablet (40 mg total) by mouth daily. 03/29/23 06/27/23  Laurey Morale, MD  hydrALAZINE (APRESOLINE) 25 MG tablet Take 1 tablet (25 mg total) by mouth 3 (three) times daily. 03/12/23 03/06/24  Lee, [place], NP  HYDROcodone-acetaminophen (NORCO/VICODIN) 5-325 MG tablet Take 1 tablet by mouth every 6 (six) hours as needed for moderate pain (pain score 4-6). 02/17/23   Fanny Dance, MD  isosorbide mononitrate (IMDUR) 30 MG 24 hr tablet Take 0.5 tablets (15 mg total) by mouth daily. 10/18/22 03/11/24  Alen Bleacher, NP  ketoconazole (NIZORAL) 2 % cream Apply 1 Application topically daily. 01/15/23   Anabel Halon, MD  metolazone (ZAROXOLYN) 2.5 MG tablet Take one tablet of metolazone by mouth today before your Lasix dose, then stop. 03/29/23   Laurey Morale, MD  Multiple Vitamins-Minerals (MULTIVITAMIN WITH MINERALS) tablet Take 1 tablet by mouth daily.    [provider]  nitrofurantoin, macrocrystal-monohydrate, (MACROBID) 100 MG capsule Take 100 mg by mouth every 12 (twelve) hours. 05/02/23   [provider]  Propylene Glycol (SYSTANE COMPLETE) 0.6 % SOLN Place 1 drop into both eyes 2 (two) times daily.    [provider]  venlafaxine XR (EFFEXOR-XR) 75 MG 24 hr capsule Take 1 capsule (75 mg total) by mouth See admin instructions. Patient taking differently: Take 75 mg by mouth See admin instructions. Daily 12/28/22   Fanny Dance, MD  zolpidem (AMBIEN) 5 MG tablet TAKE ONE TABLET BY MOUTH AT BEDTIME FOR SLEEP  04/17/23   [name], MD     Critical care time:

## 2023-05-11 NOTE — Progress Notes (Signed)
 Initial Nutrition Assessment  DOCUMENTATION CODES:  Non-severe (moderate) malnutrition in context of chronic illness  INTERVENTION:  Once cortrak in place, recommend the following: Osmolite 1.5 at 45 ml/h (1080 ml per day) Start at 15 and advance by 10mL q8h to goal Prosource TF20 60 ml 1x/d Provides 1700 kcal, 88 gm protein, 823 ml free water daily Monitor magnesium and phosphorus x 3 days, MD to replete as needed, as pt is at risk for refeeding syndrome given malnutrition. Thiamine 100mg  x 5 days  NUTRITION DIAGNOSIS:  Moderate Malnutrition related to chronic illness (COPD, CHF) as evidenced by severe muscle depletion, mild fat depletion.  GOAL:  Patient will meet greater than or equal to 90% of their needs  MONITOR:  TF tolerance, I & O's, Labs, Weight trends   REASON FOR ASSESSMENT:  Consult Enteral/tube feeding initiation and management  ASSESSMENT:  Pt with a hx of HTN, CHF, COPD, GERD, gout, prior CVA, CKD4, and DM presented to ED after pt found with AMS and CBG of 40 on a wellness check.  3/4 - admitted to ICU 3/6 - BSE, NPO recommended 3/7 - cortrak placement pending  Pt resting in bed at the time of assessment. Awake and conversant, but pleasantly confused. No family present at the time of visit. Pt remains NPO this AM, cortrak placement pending.   On exam opt with significant muscle loss. Pt endorses weight loss, unable to give a timeframe. Some fluid present on exam to the extremities and truck which is likely falsely elevating weight and hiding additional loss.   Admit weight: 69.4 kg  Current weight: 72.8 kg     Intake/Output Summary (Last 24 hours) at 05/11/2023 1254 Last data filed at 05/11/2023 0600 Gross per 24 hour  Intake 731.3 ml  Output 1835 ml  Net -1103.7 ml  Net IO Since Admission: 2,171.85 mL [05/11/23 1254]  Drains/Lines: CVC Triple Lumen, Right IJ  Nutritionally Relevant Medications: Scheduled Meds:  famotidine  10 mg Oral Daily    potassium chloride  40 mEq Oral Once   Continuous Infusions:  cefTRIAXone (ROCEPHIN)  IV Stopped (05/10/23 1530)   doxycycline (VIBRAMYCIN) IV Stopped (05/10/23 2212)   norepinephrine (LEVOPHED) Adult infusion Stopped (05/11/23 0307)   PRN Meds: docusate sodium, phenol, polyethylene glycol  Labs Reviewed: BUN 116, creatinine 4.24 CBG ranges from 76-104 mg/dL over the last 24 hours HgbA1c 6.0% (1/3)  Lab Results   Value Date   ALT 1,914 (H) 05/11/2023   AST 1,410 (H) 05/11/2023   ALKPHOS 160 (H) 05/11/2023   BILITOT 3.7 (H) 05/11/2023    NUTRITION - FOCUSED PHYSICAL EXAM: Flowsheet Row Most Recent Value  Orbital Region Mild depletion  Upper Arm Region Moderate depletion  Thoracic and Lumbar Region No depletion  Buccal Region Mild depletion  Temple Region Mild depletion  Clavicle Bone Region Moderate depletion  Clavicle and Acromion Bone Region Severe depletion  Scapular Bone Region Severe depletion  Dorsal Hand Unable to assess  [mittens]  Patellar Region Severe depletion  Anterior Thigh Region Severe depletion  Posterior Calf Region Moderate depletion  Edema (RD Assessment) Mild  Hair Reviewed  Eyes Reviewed  Mouth Reviewed  Skin Reviewed  Nails Unable to assess  [mittens]   Diet Order:   Diet Order             Diet NPO time specified  Diet effective now                   EDUCATION NEEDS:  Not appropriate  for education at this time  Skin:  Skin Assessment: Reviewed RN Assessment  Last BM:  PTA  Height:  Ht Readings from Last 1 Encounters:  05/08/23 5\' 2"  (1.575 m)    Weight:  Wt Readings from Last 1 Encounters:  05/11/23 72.8 kg    Ideal Body Weight:  50 kg  BMI:  Body mass index is 29.35 kg/m.  Estimated Nutritional Needs:  Kcal:  1500-1700 kcal/d Protein:  75-90g/d Fluid:  1.5L/d    Greig Castilla, RD, LDN Registered Dietitian II Please reach out via secure chat Weekend on-call pager # available in [facility]

## 2023-05-11 NOTE — TOC Progression Note (Addendum)
 Transition of Care [facility]) - Progression Note    Patient Details  Name: Heather [name] MRN: [id number] Date of Birth: [birth year]  [facility] CM/SW Contact  Marliss Coots, LCSW Phone Number: 05/11/2023, 1:00 PM  Clinical Narrative:     1:00 PM Patient's son (patient disoriented x3), [name], called CSW and informed of interest in SNFs within [facility]. Patient consented CSW to email ([email or web address]) current SNF offers with Medicare ratings ([facility], [address], [place], [address], [address], [address], [place], [place], [place]). CSW [facility] SNF options and Medicare ratings.  3:42 PM Patient received cortrak today.  Expected Discharge Plan: Skilled Nursing Facility Barriers to Discharge: Continued Medical Work up, SNF Pending bed offer  Expected Discharge Plan and Services In-house Referral: Clinical Social Work   Post Acute Care Choice: Skilled Nursing Facility Living arrangements for the past 2 months: Apartment                                       Social Determinants of Health (SDOH) Interventions SDOH Screenings   Food Insecurity: Patient Declined (05/09/2023)  Housing: Patient Declined (05/09/2023)  Transportation Needs: Patient Unable To Answer (05/09/2023)  Utilities: Patient Unable To Answer (05/09/2023)  Alcohol Screen: Low Risk  (11/27/2022)  Depression (PHQ2-9): Low Risk  (05/02/2023)  Financial Resource Strain: Medium Risk (11/27/2022)  Physical Activity: Insufficiently Active (11/27/2022)  Social Connections: Patient Unable To Answer (05/09/2023)  Stress: Stress Concern Present (11/27/2022)  Tobacco Use: Medium Risk (05/08/2023)  Health Literacy: Adequate Health Literacy (11/09/2022)    Readmission Risk Interventions     No data to display

## 2023-05-11 NOTE — Plan of Care (Signed)
   Problem: Education: Goal: Knowledge of General Education information will improve Description: Including pain rating scale, medication(s)/side effects and non-pharmacologic comfort measures Outcome: Not Progressing   Problem: Health Behavior/Discharge Planning: Goal: Ability to manage health-related needs will improve Outcome: Not Progressing

## 2023-05-11 NOTE — Progress Notes (Signed)
 SLP Cancellation Note  Patient Details Name: Heather [name] MRN: [id number] DOB: 08/13/1940   Cancelled treatment:       Reason Eval/Treat Not Completed: Medical issues which prohibited therapy. SLP checked back in with RN this afternoon. He says that pt got cortrak and recommends we continue to hold. Will f/u as able.     Mahala Menghini., M.A. CCC-SLP Acute Rehabilitation Services Office [phone]  Secure chat preferred  05/11/2023, 2:48 PM

## 2023-05-12 DIAGNOSIS — N184 Chronic kidney disease, stage 4 (severe): Secondary | ICD-10-CM | POA: Diagnosis not present

## 2023-05-12 DIAGNOSIS — I5023 Acute on chronic systolic (congestive) heart failure: Secondary | ICD-10-CM | POA: Diagnosis not present

## 2023-05-12 DIAGNOSIS — A419 Sepsis, unspecified organism: Secondary | ICD-10-CM | POA: Diagnosis not present

## 2023-05-12 DIAGNOSIS — R570 Cardiogenic shock: Secondary | ICD-10-CM | POA: Diagnosis not present

## 2023-05-12 LAB — BPAM FFP
Blood Product Expiration Date: 202503122359
ISSUE DATE / TIME: 202503071315
Unit Type and Rh: 6200

## 2023-05-12 LAB — GLUCOSE, CAPILLARY
Glucose-Capillary: 86 mg/dL (ref 70–99)
Glucose-Capillary: 148 mg/dL — ABNORMAL HIGH (ref 70–99)
Glucose-Capillary: 115 mg/dL — ABNORMAL HIGH (ref 70–99)
Glucose-Capillary: 140 mg/dL — ABNORMAL HIGH (ref 70–99)
Glucose-Capillary: 126 mg/dL — ABNORMAL HIGH (ref 70–99)
Glucose-Capillary: 168 mg/dL — ABNORMAL HIGH (ref 70–99)

## 2023-05-12 LAB — CBC
HCT: 29.9 % — ABNORMAL LOW (ref 36.0–46.0)
Hemoglobin: 10.3 g/dL — ABNORMAL LOW (ref 12.0–15.0)
MCH: 32.1 pg (ref 26.0–34.0)
MCHC: 34.4 g/dL (ref 30.0–36.0)
MCV: 93.1 fL (ref 80.0–100.0)
Platelets: 46 K/uL — ABNORMAL LOW (ref 150–400)
RBC: 3.21 MIL/uL — ABNORMAL LOW (ref 3.87–5.11)
RDW: 16 % — ABNORMAL HIGH (ref 11.5–15.5)
WBC: 11.1 K/uL — ABNORMAL HIGH (ref 4.0–10.5)
nRBC: 0.3 % — ABNORMAL HIGH (ref 0.0–0.2)

## 2023-05-12 LAB — PREPARE FRESH FROZEN PLASMA: Unit division: 0

## 2023-05-12 LAB — HEPATIC FUNCTION PANEL
ALT: 1666 U/L — ABNORMAL HIGH (ref 0–44)
AST: 912 U/L — ABNORMAL HIGH (ref 15–41)
Albumin: 2.8 g/dL — ABNORMAL LOW (ref 3.5–5.0)
Alkaline Phosphatase: 149 U/L — ABNORMAL HIGH (ref 38–126)
Bilirubin, Direct: 2 mg/dL — ABNORMAL HIGH (ref 0.0–0.2)
Indirect Bilirubin: 2 mg/dL — ABNORMAL HIGH (ref 0.3–0.9)
Total Bilirubin: 4 mg/dL — ABNORMAL HIGH (ref 0.0–1.2)
Total Protein: 4.7 g/dL — ABNORMAL LOW (ref 6.5–8.1)

## 2023-05-12 LAB — BASIC METABOLIC PANEL WITH GFR
Anion gap: 10 (ref 5–15)
BUN: 114 mg/dL — ABNORMAL HIGH (ref 8–23)
CO2: 24 mmol/L (ref 22–32)
Calcium: 9.1 mg/dL (ref 8.9–10.3)
Chloride: 111 mmol/L (ref 98–111)
Creatinine, Ser: 3.72 mg/dL — ABNORMAL HIGH (ref 0.44–1.00)
GFR, Estimated: 12 mL/min — ABNORMAL LOW
Glucose, Bld: 152 mg/dL — ABNORMAL HIGH (ref 70–99)
Potassium: 3.4 mmol/L — ABNORMAL LOW (ref 3.5–5.1)
Sodium: 145 mmol/L (ref 135–145)

## 2023-05-12 LAB — PROTIME-INR
INR: 1.9 — ABNORMAL HIGH (ref 0.8–1.2)
Prothrombin Time: 22.4 s — ABNORMAL HIGH (ref 11.4–15.2)

## 2023-05-12 LAB — PHOSPHORUS: Phosphorus: 4.4 mg/dL (ref 2.5–4.6)

## 2023-05-12 LAB — COOXEMETRY PANEL
Carboxyhemoglobin: 2 % — ABNORMAL HIGH (ref 0.5–1.5)
Methemoglobin: 0.8 % (ref 0.0–1.5)
O2 Saturation: 77.6 %
Total hemoglobin: 10.6 g/dL — ABNORMAL LOW (ref 12.0–16.0)

## 2023-05-12 LAB — MAGNESIUM: Magnesium: 2.4 mg/dL (ref 1.7–2.4)

## 2023-05-12 MED ORDER — VENLAFAXINE HCL 37.5 MG PO TABS
37.5000 mg | ORAL_TABLET | Freq: Two times a day (BID) | ORAL | Status: DC
  Administered 2023-05-12 – 2023-05-13 (×3): 37.5 mg
  Filled 2023-05-12 (×4): qty 1

## 2023-05-12 MED ORDER — MELATONIN 3 MG PO TABS
3.0000 mg | ORAL_TABLET | Freq: Every day | ORAL | Status: DC
  Administered 2023-05-13: 3 mg

## 2023-05-12 MED ORDER — POTASSIUM CHLORIDE 20 MEQ PO PACK
40.0000 meq | PACK | Freq: Once | ORAL | Status: AC
  Administered 2023-05-12: 40 meq
  Filled 2023-05-12: qty 2

## 2023-05-12 NOTE — Progress Notes (Signed)
 NAME:  Heather [name], MRN:  [id number], DOB:  22-Jun-1940, LOS: 4 ADMISSION DATE:  05/08/2023, CONSULTATION DATE:  05/08/2023 REFERRING MD:  Charm Barges , CHIEF COMPLAINT:  Weakness, AMS   History of Present Illness:  Heather [name] is a 83 y.o. female who has a PMH as below. Police was called 3/4 for a wellness visit and upon their arrival, she was found to have AMS. She was last known well the day prior. EMS was called and upon their arrival, CBG was 40 and she was given IM glucagon before being transported to [facility] ED.    In ED, initial CBG too low to pickup. ABG with profound metabolic acidosis (6.97/54/42). Workup also noteable for AKI with SCr 4.71/BUN 99, AG 24, ALT 1762, trop 245, lactate > 9, WBC 12.4. Tmax 100.5 rectally, BP and HR normal. UA unremarkable. CT head and CT chest/abd/pelv pending.   Due to her multiple metabolic derangements, PCCM called for transfer to [facility] ICU.   Of note, cMRI in Jan 2024 showed LVEF 16%, mild apical inferolateral subendocardial LGE, mod dilated RV with RVEF 23%. Echo from April 2024 showed EF < 20% with mod LV enlargement, septal lateral dyssynchrony, mod RV dysfunction, PASP 65. She was referred to EP and underwent St Jude CRT-P placement 5/24. Echo 9/24, EF 20-25%, mild LV dilation, moderate RV dysfunction with normal size. On arrival at [facility] BG was 99.  Pertinent  Medical History  has Rotator cuff arthropathy; Renal insufficiency; Essential hypertension; Gout; Long-term current use of opiate analgesic; Bilateral carpal tunnel syndrome; Chronic low back pain; Lumbosacral radiculopathy; Polyarthropathy; Sacroiliac joint pain; Preventative health care; Hyperlipidemia; Anxiety; COPD GOLD 1 ; Gastroesophageal reflux disease; Intertrigo; AKI (acute kidney injury) (HCC); Prediabetes; Leg edema; DOE (dyspnea on exertion); Chronic kidney disease, stage IV (severe) (HCC); Tremor; Acute kidney injury (HCC); Leg swelling; SOBOE (shortness of breath on exertion); Hyperkalemia;  Acute systolic CHF (congestive heart failure) (HCC); History of cerebellar stroke; OSA on CPAP; Bloating; Acute on chronic systolic CHF (congestive heart failure) (HCC); Prolonged QT interval; Chronic systolic HF (heart failure) (HCC); LVF (left ventricular failure) (HCC); 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   Significant Hospital Events: Including procedures, antibiotic start and stop dates in addition to other pertinent events   3/4 Admitted to PCCM, Started on IV cefepime/van/doxy. Started on IV Milrinone and IV Bicarb. ECHO ordered 3/7 core track placed  Interim History / Subjective:  Confusion, sundowning reported evening 3/7.  Received melatonin, Benadryl as needed ordered I/O+ 1.6 L total, urine output 1.96 L last 24 hours Milrinone down to 0.125, nor epi at 2.  Co. oximetry 77.6% 2L/min [place]   Objective   Blood pressure 112/64, pulse 72, temperature 98.4 F (36.9 C), resp. rate 20, height 5\' 2"  (1.575 m), weight 72.8 kg, SpO2 95%. CVP:  [11 mmHg-16 mmHg] 16 mmHg      Intake/Output Summary (Last 24 hours) at 05/12/2023 0810 Last data filed at 05/12/2023 0700 Gross per 24 hour  Intake 1443.06 ml  Output 1785 ml  Net -341.94 ml   Filed Weights   05/10/23 0500 05/11/23 0500 05/12/23 0500  Weight: 73.6 kg 72.8 kg 72.8 kg    Examination: General: Woman laying in bed in no distress Neuro: Awake, more interactive.  Oriented to self, place and situation.  Not time or date.  Moves extremities spontaneously and follows commands HENT: Strong voice, oropharynx clear, no stridor, no secretions Lungs: Scattered bilateral inspiratory crackles.  No wheezing Cardiovascular:  Regular, no murmur Abdomen: Diffuse mild tenderness on palpation Extremities: 1+ lower extremity edema GU: Deferred   Pertinent Imaging: CTH : No acute CT finding. Age related volume loss. Mild chronic small-vessel ischemic change of the white matter CT CAP: Dependent atelectasis/consolidation in both lower lobes. 7 mm right middle lobe nodule. Right Spigelian hernia containing only fat.  CXR: Cardiomegaly. Worsening airspace disease at the left > right lung base which may be due to atelectasis or pneumonia. Possible small left effusion. ECHO today: Global hypokinesis worse apex and septum with akinesis. LVEF 25% with regional wall motion abnormalities. Severely enlarged LV. G I DD. RV function normal.   Resolved Hospital Problem list   Lactic acidosis Hyperphosphatemia  Assessment & Plan:  #Shock Likely Combination of Sepsis and cardiogenic Presented with severe acidosis, elevated LFTs, Troponin. Imaging findings showed L>R increased opacities concerning for consolidation/pleural effusion. These findings are consistent with sepsis. In addition, co-ox 58.1 and CVP 16, with underlying biventricular HF, NICM EF 20-25%, suggesting cardiogenic etiology.  -Will try to discontinue milrinone 3/8, follow hemodynamics, Co. oximetry for 1 more day -Wean norepinephrine aggressively, goal MAP 65 -Doxycycline and ceftriaxone, plan through 3/10 -Follow cultures to completion  Acute hypoxic respiratory failure  Community Acquired Pneumonia Hx of COPD -Complete full course of doxycycline, ceftriaxone, stop date 3/10 -Trelegy transition to nebulized budesonide, Brovana, Yupelri while admitted -Wean O2 as able  AKI on CKD3 baseline Scr~2.1-2.4  -Following serum creatinine trend: 3.72 < 4.24 < 4.87 -Maintain adequate renal perfusion -Follow BMP and I's/O closely  Type 2 Diabetes Mellitus   Hypoglycemia Hypoglycemic event in the setting of AKI, decrease renal clearance of Jardiance and septic  state.  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  -Plan stop milrinone 3/8, follow Co. oximetry for another day -Wean norepinephrine as able -Home regimen remains on hold: Bisoprolol, Lasix, hydralazine, Imdur, metolazone.  Likely will need to add back diuretics in the next 24-48 hours as sepsis resolves  Acute encephalopathy, multifactorial due to critical illness, medications -Work on sleep-wake cycle, -Minimize sedating medications, plan to stop Benadryl.  Melatonin nightly okay  Hx of PVC -Continue to hold amiodarone for now.  Restart as hemodynamics allow and as hepatic injury resolves  Elevated LFTs Coagulopathy - Hepatic congestion/Shock Liver in the setting of shock  -Statin and amiodarone on hold -Follow intermittent LFT  Anemia, normocytic Thrombocytopenia -Follow intermittent CBC d  7mm RML nodule. -Plan follow-up imaging as an outpatient, 6 to 12 months   Lumbar Radiculitis Chronic Pain syndrome -Restart Effexor when she is able to take p.o. -Holding narcotics at this time to avoid confusion, encephalopathy   Best Practice (right click and "Reselect all SmartList Selections" daily)   Diet/type: Mechanical soft diet  DVT prophylaxis: INR elevated.  Pressure ulcer(s): Assessed by RN  GI prophylaxis: N/A Lines: RIJ  Foley:  Yes, and it is still needed  Code Status:  full code Last date of multidisciplinary goals of care discussion [updated granddaughter at bedside 3/8]  Labs   CBC: Recent Labs  Lab 05/08/23 1155 05/08/23 1212  05/08/23 1805 05/09/23 0536 05/10/23 0344 05/11/23 0516 05/12/23 0310  WBC 12.4*  --  12.3* 12.1* 11.6* 11.3* 11.1*  NEUTROABS 11.0*  --   --   --   --   --   --   HGB 11.4*   < > 10.9* 9.7* 10.6* 10.6* 10.3*  HCT 36.8   < > 33.5* 28.6* 30.1* 30.3* 29.9*  MCV 104.8*  --  100.3* 95.0 91.5 92.1 93.1  PLT 132*  --  113* 82* 73* 53* 46*   < > = values in this interval not displayed.    Basic Metabolic Panel: Recent Labs  Lab 05/08/23 1805 05/09/23 0428 05/10/23 0344 05/11/23 0516 05/12/23 0310  NA 140 140 139 143 145  K 4.4 4.0 3.3* 3.5 3.4*  CL 102 101 100 107 111  CO2 15* 22 23 25 24   GLUCOSE 115* 149* 96 90 152*  BUN 102* 119* 119* 116* 114*  CREATININE 4.82* 4.84* 4.87* 4.24* 3.72*  CALCIUM 8.3* 8.0* 8.1* 8.5* 9.1  MG  --  2.3 2.3 2.2 2.4  PHOS  --  7.5* 5.5* 4.5 4.4   GFR: Estimated Creatinine Clearance: 11.1 mL/min (A) (by C-G formula based on SCr of 3.72 mg/dL (H)). Recent Labs  Lab 05/08/23 1155 05/08/23 1349 05/08/23 1512 05/08/23 1805 05/09/23 0536 05/09/23 0650 05/10/23 0344 05/11/23 0516 05/12/23 0310  PROCALCITON  --   --   --  2.21  --   --   --   --   --   WBC 12.4*  --   --  12.3* 12.1*  --  11.6* 11.3* 11.1*  LATICACIDVEN >9.0* >9.0* >9.0*  --   --  3.0*  --   --   --     Liver Function Tests: Recent Labs  Lab 05/08/23 1805 05/09/23 0428 05/10/23 0344 05/11/23 0516 05/12/23 0310  AST 4,662* 4,931* 2,916* 1,410* 912*  ALT 2,548* 2,675* 2,618* 1,914* 1,666*  ALKPHOS 202* 163* 163* 160* 149*  BILITOT 3.6* 3.7* 3.9* 3.7* 4.0*  PROT 5.2* 4.6* 4.5* 4.4* 4.7*  ALBUMIN 2.7* 2.7* 2.6* 2.6* 2.8*   No results for input(s): "LIPASE", "AMYLASE" in the last 168 hours. Recent Labs  Lab 05/10/23 0344  AMMONIA 19    ABG    Component Value Date/Time   PHART 7.382 10/27/2021 0906   PCO2ART 39.8 10/27/2021 0906   PO2ART 70 (L) 10/27/2021 0906   HCO3 12.2 (L) 05/08/2023 1247   TCO2 16 (L) 05/08/2023 1212   ACIDBASEDEF 19.2 (H) 05/08/2023 1247    O2SAT 77.6 05/12/2023 0310     Coagulation Profile: Recent Labs  Lab 05/08/23 1155 05/09/23 0428 05/10/23 0344 05/11/23 0516 05/12/23 0310  INR 3.2* 4.8* 3.9* 2.5* 1.9*    Cardiac Enzymes: Recent Labs  Lab 05/08/23 1355  CKTOTAL 345*    HbA1C: Hgb A1c MFr Bld  Date/Time Value Ref Range Status  03/09/2023 10:18 AM 6.0 (H) 4.8 - 5.6 % Final    Comment:             Prediabetes: 5.7 - 6.4          Diabetes: >6.4          Glycemic control for adults with diabetes: <7.0   05/26/2022 03:46 PM 6.5 (H) 4.8 - 5.6 % Final  Comment:             Prediabetes: 5.7 - 6.4          Diabetes: >6.4          Glycemic control for adults with diabetes: <7.0     CBG: Recent Labs  Lab 05/11/23 1534 05/11/23 1936 05/11/23 2329 05/12/23 0332 05/12/23 0801  GLUCAP 97 107* 144* 86 148*     Critical care time: 32 min     Levy Pupa, MD, PhD 05/12/2023, 8:10 AM [facility] [phone] or if no answer before 7:00PM call [phone] For any issues after 7:00PM please call eLink [phone]

## 2023-05-12 NOTE — Plan of Care (Signed)

## 2023-05-13 ENCOUNTER — Inpatient Hospital Stay (HOSPITAL_COMMUNITY)

## 2023-05-13 DIAGNOSIS — R570 Cardiogenic shock: Secondary | ICD-10-CM | POA: Diagnosis not present

## 2023-05-13 DIAGNOSIS — I5023 Acute on chronic systolic (congestive) heart failure: Secondary | ICD-10-CM | POA: Diagnosis not present

## 2023-05-13 DIAGNOSIS — A419 Sepsis, unspecified organism: Secondary | ICD-10-CM | POA: Diagnosis not present

## 2023-05-13 DIAGNOSIS — E440 Moderate protein-calorie malnutrition: Secondary | ICD-10-CM | POA: Insufficient documentation

## 2023-05-13 DIAGNOSIS — N184 Chronic kidney disease, stage 4 (severe): Secondary | ICD-10-CM | POA: Diagnosis not present

## 2023-05-13 LAB — COOXEMETRY PANEL
Carboxyhemoglobin: 2.1 % — ABNORMAL HIGH (ref 0.5–1.5)
Methemoglobin: <0.7 % (ref 0.0–1.5)
O2 Saturation: 64 %
Total hemoglobin: 11 g/dL — ABNORMAL LOW (ref 12.0–16.0)

## 2023-05-13 LAB — CBC
HCT: 30.4 % — ABNORMAL LOW (ref 36.0–46.0)
Hemoglobin: 10.2 g/dL — ABNORMAL LOW (ref 12.0–15.0)
MCH: 31.5 pg (ref 26.0–34.0)
MCHC: 33.6 g/dL (ref 30.0–36.0)
MCV: 93.8 fL (ref 80.0–100.0)
Platelets: 36 10*3/uL — ABNORMAL LOW (ref 150–400)
RBC: 3.24 MIL/uL — ABNORMAL LOW (ref 3.87–5.11)
RDW: 16.7 % — ABNORMAL HIGH (ref 11.5–15.5)
WBC: 8.4 10*3/uL (ref 4.0–10.5)
nRBC: 0.2 % (ref 0.0–0.2)

## 2023-05-13 LAB — GLUCOSE, CAPILLARY
Glucose-Capillary: 149 mg/dL — ABNORMAL HIGH (ref 70–99)
Glucose-Capillary: 153 mg/dL — ABNORMAL HIGH (ref 70–99)
Glucose-Capillary: 125 mg/dL — ABNORMAL HIGH (ref 70–99)
Glucose-Capillary: 101 mg/dL — ABNORMAL HIGH (ref 70–99)
Glucose-Capillary: 118 mg/dL — ABNORMAL HIGH (ref 70–99)

## 2023-05-13 LAB — BASIC METABOLIC PANEL
Anion gap: 10 (ref 5–15)
BUN: 98 mg/dL — ABNORMAL HIGH (ref 8–23)
CO2: 24 mmol/L (ref 22–32)
Calcium: 8.7 mg/dL — ABNORMAL LOW (ref 8.9–10.3)
Chloride: 118 mmol/L — ABNORMAL HIGH (ref 98–111)
Creatinine, Ser: 2.88 mg/dL — ABNORMAL HIGH (ref 0.44–1.00)
GFR, Estimated: 16 mL/min — ABNORMAL LOW (ref >60)
Glucose, Bld: 130 mg/dL — ABNORMAL HIGH (ref 70–99)
Potassium: 3.6 mmol/L (ref 3.5–5.1)
Sodium: 152 mmol/L — ABNORMAL HIGH (ref 135–145)

## 2023-05-13 LAB — CULTURE, BLOOD (ROUTINE X 2)
Culture: NO GROWTH
Culture: NO GROWTH
Special Requests: ADEQUATE

## 2023-05-13 LAB — MAGNESIUM: Magnesium: 2.3 mg/dL (ref 1.7–2.4)

## 2023-05-13 MED ORDER — MELATONIN 3 MG PO TABS
3.0000 mg | ORAL_TABLET | Freq: Every day | ORAL | Status: DC
  Administered 2023-05-14 – 2023-05-20 (×7): 3 mg via ORAL
  Filled 2023-05-13 (×8): qty 1

## 2023-05-13 MED ORDER — FAMOTIDINE 20 MG PO TABS
10.0000 mg | ORAL_TABLET | Freq: Every day | ORAL | Status: DC
  Administered 2023-05-14 – 2023-05-21 (×8): 10 mg via ORAL
  Filled 2023-05-13 (×9): qty 1

## 2023-05-13 MED ORDER — FREE WATER
200.0000 mL | Status: DC
  Administered 2023-05-13 – 2023-05-16 (×19): 200 mL

## 2023-05-13 MED ORDER — THIAMINE MONONITRATE 100 MG PO TABS
100.0000 mg | ORAL_TABLET | Freq: Every day | ORAL | Status: AC
  Administered 2023-05-14 – 2023-05-15 (×2): 100 mg via ORAL
  Filled 2023-05-13 (×2): qty 1

## 2023-05-13 MED ORDER — DOCUSATE SODIUM 50 MG/5ML PO LIQD
100.0000 mg | Freq: Two times a day (BID) | ORAL | Status: DC | PRN
  Administered 2023-05-18: 100 mg via ORAL
  Filled 2023-05-13: qty 10

## 2023-05-13 MED ORDER — VENLAFAXINE HCL 37.5 MG PO TABS
37.5000 mg | ORAL_TABLET | Freq: Two times a day (BID) | ORAL | Status: DC
  Administered 2023-05-14 – 2023-05-21 (×15): 37.5 mg via ORAL
  Filled 2023-05-13 (×16): qty 1

## 2023-05-13 MED ORDER — POLYETHYLENE GLYCOL 3350 17 G PO PACK
17.0000 g | PACK | Freq: Every day | ORAL | Status: DC
Start: 2023-05-14 — End: 2023-05-21
  Administered 2023-05-14 – 2023-05-21 (×6): 17 g via ORAL
  Filled 2023-05-13 (×7): qty 1

## 2023-05-13 NOTE — Plan of Care (Signed)

## 2023-05-13 NOTE — Progress Notes (Addendum)
 NAME:  Heather [name], MRN:  [id number], DOB:  26-May-1940, LOS: 5 ADMISSION DATE:  05/08/2023, CONSULTATION DATE:  05/08/2023 REFERRING MD:  Charm Barges , CHIEF COMPLAINT:  Weakness, AMS   History of Present Illness:  Heather [name] is a 83 y.o. female who has a PMH as below. Police was called 3/4 for a wellness visit and upon their arrival, she was found to have AMS. She was last known well the day prior. EMS was called and upon their arrival, CBG was 40 and she was given IM glucagon before being transported to [facility] ED.    In ED, initial CBG too low to pickup. ABG with profound metabolic acidosis (6.97/54/42). Workup also noteable for AKI with SCr 4.71/BUN 99, AG 24, ALT 1762, trop 245, lactate > 9, WBC 12.4. Tmax 100.5 rectally, BP and HR normal. UA unremarkable. CT head and CT chest/abd/pelv pending.   Due to her multiple metabolic derangements, PCCM called for transfer to [facility] ICU.   Of note, cMRI in Jan 2024 showed LVEF 16%, mild apical inferolateral subendocardial LGE, mod dilated RV with RVEF 23%. Echo from April 2024 showed EF < 20% with mod LV enlargement, septal lateral dyssynchrony, mod RV dysfunction, PASP 65. She was referred to EP and underwent St Jude CRT-P placement 5/24. Echo 9/24, EF 20-25%, mild LV dilation, moderate RV dysfunction with normal size. On arrival at [facility] BG was 99.  Pertinent  Medical History  has Rotator cuff arthropathy; Renal insufficiency; Essential hypertension; Gout; Long-term current use of opiate analgesic; Bilateral carpal tunnel syndrome; Chronic low back pain; Lumbosacral radiculopathy; Polyarthropathy; Sacroiliac joint pain; Preventative health care; Hyperlipidemia; Anxiety; COPD GOLD 1 ; Gastroesophageal reflux disease; Intertrigo; AKI (acute kidney injury) (HCC); Prediabetes; Leg edema; DOE (dyspnea on exertion); Chronic kidney disease, stage IV (severe) (HCC); Tremor; Acute kidney injury (HCC); Leg swelling; SOBOE (shortness of breath on exertion); Hyperkalemia;  Acute systolic CHF (congestive heart failure) (HCC); History of cerebellar stroke; OSA on CPAP; Bloating; Acute on chronic systolic CHF (congestive heart failure) (HCC); Prolonged QT interval; Chronic systolic HF (heart failure) (HCC); LVF (left ventricular failure) (HCC); 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   Significant Hospital Events: Including procedures, antibiotic start and stop dates in addition to other pertinent events   3/4 Admitted to PCCM, Started on IV cefepime/van/doxy. Started on IV Milrinone and IV Bicarb. ECHO ordered 3/7 core track placed  Interim History / Subjective:  -2 L/min nasal cannula -Milrinone off, norepinephrine weaned to off.  Co. oximetry 64% -I/O+ 1.6 L total, urine output 1.5 L / 24 hours -Sodium 145 >> 152 -SCr 3.72 >> 2.88   Objective   Blood pressure 127/67, pulse 67, temperature 99 F (37.2 C), resp. rate (!) 24, height 5\' 2"  (1.575 m), weight (P) 71.8 kg, SpO2 100%. CVP:  [10 mmHg] 10 mmHg      Intake/Output Summary (Last 24 hours) at 05/13/2023 0741 Last data filed at 05/13/2023 0600 Gross per 24 hour  Intake 1641.69 ml  Output 1601 ml  Net 40.69 ml   Filed Weights   05/11/23 0500 05/12/23 0500 05/13/23 0600  Weight: 72.8 kg 72.8 kg (P) 71.8 kg    Examination: General: No distress, comfortable in bed Neuro: Awake, clearly more interactive.  Oriented to  self, place.  Answers questions, follows commands, moves extremities HENT: Oropharynx clear, no stridor, no secretions Lungs: No wheeze Cardiovascular: Regular, distant, no murmur Abdomen: Some epigastric tenderness, no rebound or guarding Extremities: Trace lower extremity  edema GU: Deferred   Pertinent Imaging: CTH : No acute CT finding. Age related volume loss. Mild chronic small-vessel ischemic change of the white matter CT CAP: Dependent atelectasis/consolidation in both lower lobes. 7 mm right middle lobe nodule. Right Spigelian hernia containing only fat.  CXR: Cardiomegaly. Worsening airspace disease at the left > right lung base which may be due to atelectasis or pneumonia. Possible small left effusion. ECHO today: Global hypokinesis worse apex and septum with akinesis. LVEF 25% with regional wall motion abnormalities. Severely enlarged LV. G I DD. RV function normal.   Resolved Hospital Problem list   Lactic acidosis Hyperphosphatemia  Assessment & Plan:  #Shock Likely Combination of Sepsis and cardiogenic Presented with severe acidosis, elevated LFTs, Troponin. Imaging findings showed L>R increased opacities concerning for consolidation/pleural effusion. These findings are consistent with sepsis. In addition, co-ox 58.1 and CVP 16, with underlying biventricular HF, NICM EF 20-25%, suggesting cardiogenic etiology.  -Off milrinone norepinephrine since 3/8.  Co. oximetry down to 64%.  Still has good urine output and improving renal function.  Following off pressors -Continue doxycycline and ceftriaxone through 3/10 -Follow cultures to completion  Acute hypoxic respiratory failure  Community Acquired Pneumonia Hx of COPD -Doxycycline and ceftriaxone through 3/10 as above -Budesonide, Brovana, Yupelri as a substitute for Trelegy while admitted -Wean oxygen as able  AKI on CKD3 baseline Scr~2.1-2.4  -Urine output and serum creatinine both improved -Continue to follow off diuretics -Follow BMP closely  Hypernatremia, due to vigorous auto diuresis -Add back free water 3/9 -Follow BMP  Type 2 Diabetes Mellitus   Hypoglycemia Hypoglycemic event in the setting of AKI, decrease renal clearance of Jardiance and septic state.  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  -Milrinone and norepinephrine off as above -Hopefully can begin to add back home regimen soon.  Would defer her Lasix, metolazone for 3/9 given auto diuresis.  Also on bisoprolol, hydralazine, Imdur at home  Acute encephalopathy, multifactorial due to critical illness, medications -Optimize sleep-wake cycle -Minimize sedating medications, melatonin ordered nightly  Hx of PVC -Restart amiodarone in coming days as her hepatic injury resolves  Elevated LFTs Coagulopathy - Hepatic congestion/Shock Liver in the setting of shock  -Statin and amiodarone on hold -Following intermittent LFT  Anemia, normocytic Thrombocytopenia -Following intermittent CBC -hold heparin sq on 3/9 following plt  7mm RML nodule. -Plan follow-up imaging as an outpatient, 6 to 12 months   Lumbar Radiculitis Chronic Pain syndrome -Restart Effexor when she is able to swallow -Continue to hold narcotics at this time to avoid confusion, encephalopathy  Nutrition -She is still receiving tube feeds via core track -Bedside swallow evaluation or SLP evaluation today, hopefully can initiate p.o. feeds  Disposition -Should be able to leave the ICU soon, possibly 3/9   Best Practice (right click and "Reselect all SmartList Selections" daily)   Diet/type: Mechanical soft diet  DVT prophylaxis: INR elevated. Heparin stopped 3/9 for low plt Pressure ulcer(s): Assessed by RN  GI prophylaxis: N/A Lines: RIJ  Foley:  Yes, and it is still needed Code Status:  full code Last date of multidisciplinary goals of care discussion [updated granddaughter at bedside 3/8]  Labs    CBC: Recent Labs  Lab 05/08/23 1155 05/08/23 1212 05/09/23 0536 05/10/23 0344 05/11/23 0516 05/12/23 0310 05/13/23 0458  WBC 12.4*   < > 12.1* 11.6* 11.3* 11.1* 8.4  NEUTROABS 11.0*  --   --   --   --   --   --   HGB 11.4*   < > 9.7* 10.6* 10.6* 10.3* 10.2*  HCT 36.8   < > 28.6* 30.1* 30.3* 29.9* 30.4*  MCV 104.8*   < > 95.0 91.5 92.1 93.1 93.8  PLT 132*   < > 82* 73* 53* 46* 36*   < > = values in this interval not displayed.    Basic Metabolic Panel: Recent Labs  Lab 05/09/23 0428 05/10/23 0344 05/11/23 0516 05/12/23 0310 05/13/23 0458  NA 140 139 143 145 152*  K 4.0 3.3* 3.5 3.4* 3.6  CL 101 100 107 111 118*  CO2 22 23 25 24 24   GLUCOSE 149* 96 90 152* 130*  BUN 119* 119* 116* 114* 98*  CREATININE 4.84* 4.87* 4.24* 3.72* 2.88*  CALCIUM 8.0* 8.1* 8.5* 9.1 8.7*  MG 2.3 2.3 2.2 2.4 2.3  PHOS 7.5* 5.5* 4.5 4.4  --    GFR: Estimated Creatinine Clearance: 14.3 mL/min (A) (by C-G formula based on SCr of 2.88 mg/dL (H)). Recent Labs  Lab 05/08/23 1155 05/08/23 1349 05/08/23 1512 05/08/23 1805 05/09/23 0536 05/09/23 0650 05/10/23 0344 05/11/23 0516 05/12/23 0310 05/13/23 0458  PROCALCITON  --   --   --  2.21  --   --   --   --   --   --   WBC 12.4*  --   --  12.3*   < >  --  11.6* 11.3* 11.1* 8.4  LATICACIDVEN >9.0* >9.0* >9.0*  --   --  3.0*  --   --   --   --    < > = values in this interval not displayed.    Liver Function Tests: Recent Labs  Lab 05/08/23 1805 05/09/23 0428 05/10/23 0344 05/11/23 0516 05/12/23 0310  AST 4,662* 4,931* 2,916* 1,410* 912*  ALT 2,548* 2,675* 2,618* 1,914* 1,666*  ALKPHOS 202* 163* 163* 160* 149*  BILITOT 3.6* 3.7* 3.9* 3.7* 4.0*  PROT 5.2* 4.6* 4.5* 4.4* 4.7*  ALBUMIN 2.7* 2.7* 2.6* 2.6* 2.8*   No results for input(s): "LIPASE", "AMYLASE" in the last 168 hours. Recent Labs  Lab 05/10/23 0344  AMMONIA 19    ABG    Component Value Date/Time   PHART 7.382 10/27/2021 0906   PCO2ART 39.8 10/27/2021 0906    PO2ART 70 (L) 10/27/2021 0906   HCO3 12.2 (L) 05/08/2023 1247   TCO2 16 (L) 05/08/2023 1212   ACIDBASEDEF 19.2 (H) 05/08/2023 1247   O2SAT 64 05/13/2023 0500     Coagulation Profile: Recent Labs  Lab 05/08/23 1155 05/09/23 0428 05/10/23 0344 05/11/23 0516 05/12/23 0310  INR 3.2* 4.8* 3.9* 2.5* 1.9*    Cardiac Enzymes: Recent Labs  Lab 05/08/23 1355  CKTOTAL 345*    HbA1C: Hgb A1c MFr Bld  Date/Time Value Ref Range Status  03/09/2023 10:18 AM 6.0 (H) 4.8 - 5.6 % Final    Comment:             Prediabetes: 5.7 - 6.4          Diabetes: >6.4  Glycemic control for adults with diabetes: <7.0   05/26/2022 03:46 PM 6.5 (H) 4.8 - 5.6 % Final    Comment:             Prediabetes: 5.7 - 6.4          Diabetes: >6.4          Glycemic control for adults with diabetes: <7.0     CBG: Recent Labs  Lab 05/12/23 1117 05/12/23 1523 05/12/23 1939 05/12/23 2320 05/13/23 0329  GLUCAP 115* 140* 126* 168* 149*     Critical care time: 31 min     Levy Pupa, MD, PhD 05/13/2023, 7:41 AM [facility] [phone] or if no answer before 7:00PM call [phone] For any issues after 7:00PM please call eLink [phone]

## 2023-05-13 NOTE — Evaluation (Signed)
 Modified Barium Swallow Study  Patient Details  Name: Heather [name] MRN: [id number] Date of Birth: [birth year]  Today's Date: 05/13/2023  Modified Barium Swallow completed.  Full report located under Chart Review in the Imaging Section.  History of Present Illness 83yo female admitted 05/08/23 with weakness, AMS following police wellness visit. PMH: HFrEF, renal insuff, HTN, gout, longterm use of opiate analgesic, chronic low back pain, HLD, anxiety, COPD, GERD, AKI, DM, CKD4, sCHF. CXR = worsening airspace dz L>RLL. HeadCT = no acute finding   Clinical Impression Patient presents with a functional oropharyngeal swallow. Intermittent delay in swallow initiation noted with thin liquids, first bolus and when combined with pill, with resultant trace flash penetration of liquids. No frank penetration or aspiration observed. Trace-mild lingual, base of tongue, and vallecular coating noted post swallow, suspect secondary to dry oral cavity and dried secretions/blood noted to coat epiglottis during FEES this am. This residue clears with subsequent swallows. Recommend dysphagia 2 solids, for energy conservation as patient with increased WOB during mastication, and thin liquids with SLP f/u for tolerance and potential to advance. Factors that may increase risk of adverse event in presence of aspiration Rubye Oaks & Clearance Coots 2021):    Swallow Evaluation Recommendations Recommendations: PO diet PO Diet Recommendation: Dysphagia 2 (Finely chopped);Thin liquids (Level 0) Liquid Administration via: Cup;Straw Medication Administration: Whole meds with liquid Supervision: Staff to assist with self-feeding Swallowing strategies  : Slow rate;Small bites/sips Postural changes: Position pt fully upright for meals Oral care recommendations: Oral care BID (2x/day)    Heather Martes MA, CCC-SLP   Heather [name] 05/13/2023,2:58 PM

## 2023-05-13 NOTE — Plan of Care (Signed)

## 2023-05-13 NOTE — Progress Notes (Signed)
 Speech Language Pathology Treatment: Dysphagia  Patient Details Name: Heather [name] MRN: [id number] DOB: 1940-08-04 Today's Date: 05/13/2023 Time: 4098-1191 SLP Time Calculation (min) (ACUTE ONLY): 20 min  Assessment / Plan / Recommendation Clinical Impression  Requested by RN to see patient today for po readiness with reports that she is more alert with less confusion. Based on notes from previous SLPs, patient is more interactive today. She was alert and cooperative, verbalizing thirst. She was able to self feed with min verbal cues for attention to task. Delayed coughing noted throughout po trials however patient with baseline congested cough making diagnostics at bedside difficult. Overall, her oral transit of thin liquids and pureed solids appears functional with strong appearing hyolaryngeal movement upon palpation. She does present with continued facial grimacing with all boluses however, denying pain but reporting "tight" feeling in throat. Question presence of pharyngeal and/or esophageal thrush given white lingual coating. In light on ongoing confusion, acute PNA, and subtle signs of dysphagia, recommend instrumental testing via FEES prior to initiation of a po diet. Will plan for FEES today.    HPI HPI: 83yo female admitted 05/08/23 with weakness, AMS following police wellness visit. PMH: HFrEF, renal insuff, HTN, gout, longterm use of opiate analgesic, chronic low back pain, HLD, anxiety, COPD, GERD, AKI, DM, CKD4, sCHF. CXR = worsening airspace dz L>RLL. HeadCT = no acute finding      SLP Plan  MBS      Recommendations for follow up therapy are one component of a multi-disciplinary discharge planning process, led by the attending physician.  Recommendations may be updated based on patient status, additional functional criteria and insurance authorization.    Recommendations  Diet recommendations: NPO Medication Administration: Via alternative means                  Oral care  QID           MBS    Heather Lastinger MA, CCC-SLP  Heather [name]  05/13/2023, 9:50 AM

## 2023-05-13 NOTE — Procedures (Signed)
 Objective Swallowing Evaluation: Type of Study: FEES-Fiberoptic Endoscopic Evaluation of Swallow   Patient Details  Name: Heather [name] MRN: [id number] Date of Birth: 1940-05-18  Today's Date: 05/13/2023 Time: SLP Start Time (ACUTE ONLY): 1135 -SLP Stop Time (ACUTE ONLY): 1215  SLP Time Calculation (min) (ACUTE ONLY): 40 min   Past Medical History:  Past Medical History:  Diagnosis Date   Anemia    Anxiety    Arthritis    COPD (chronic obstructive pulmonary disease) (HCC)    CVA (cerebral vascular accident) (HCC)    Depression    Diabetes mellitus without complication (HCC)    Dry eyes 04/14/2014   GERD (gastroesophageal reflux disease)    Glaucoma    Gout    HTN (hypertension)    OSA (obstructive sleep apnea)    Past Surgical History:  Past Surgical History:  Procedure Laterality Date   ABDOMINAL HYSTERECTOMY     BACK SURGERY     lumbar-disc   BIV PACEMAKER INSERTION CRT-P N/A 07/12/2022   Procedure: BIV PACEMAKER INSERTION CRT-P;  Surgeon: Duke Salvia, MD;  Location: [facility] INVASIVE CV LAB;  Service: Cardiovascular;  Laterality: N/A;   BUNIONECTOMY Bilateral    FOOT SURGERY Left    repair of "kissing Cousins"   JOINT REPLACEMENT Bilateral    hip    JOINT REPLACEMENT Right 2010 or 2012   knee   KNEE SURGERY     RIGHT/LEFT HEART CATH AND CORONARY ANGIOGRAPHY N/A 10/27/2021   Procedure: RIGHT/LEFT HEART CATH AND CORONARY ANGIOGRAPHY;  Surgeon: Corky Crafts, MD;  Location: [facility] INVASIVE CV LAB;  Service: Cardiovascular;  Laterality: N/A;   SHOULDER ARTHROSCOPY Right    shoulder surgery in [place] about 2000   SHOULDER HEMI-ARTHROPLASTY Left 03/25/2014   Procedure: LEFT SHOULDER HEMI-ARTHROPLASTY;  Surgeon: Vickki Hearing, MD;  Location: [facility];  Service: Orthopedics;  Laterality: Left;   HPI: 83yo female admitted 05/08/23 with weakness, AMS following police wellness visit. PMH: HFrEF, renal insuff, HTN, gout, longterm use of opiate analgesic, chronic low back  pain, HLD, anxiety, COPD, GERD, AKI, DM, CKD4, sCHF. CXR = worsening airspace dz L>RLL. HeadCT = no acute finding   Subjective: Pt awake, unable to follow commands consistently. Son present    Recommendations for follow up therapy are one component of a multi-disciplinary discharge planning process, led by the attending physician.  Recommendations may be updated based on patient status, additional functional criteria and insurance authorization.  Assessment / Plan / Recommendation     05/13/2023   12:00 PM  Clinical Impressions  Clinical Impression FEES complete but limited today and without conclusive results. Both nares contain dried blood, unclear cause, possible cortrak placement? SLP was able to pass scope eventually through right naris however dried blood continued to coat down to the level of the epiglottis with dried blood and secretions blocking view of laryx. Provided ice chip which patient was not able to manipulate orally, likely worsening confision while distracted by exam, eventually expectorating. Provided thin liquids in attempts to clear secretaions/blood wtihout success. No s/s of aspiration observed with these thin liquid trials however question of aspiration with PNA, chest congestion, and confusion remains. Will plan for MBS either this pm or tomorrow.  SLP Visit Diagnosis Dysphagia, unspecified (R13.10)  Attention and concentration deficit following --  Frontal lobe and executive function deficit following --  Impact on safety and function --         05/13/2023   12:00 PM  Treatment Recommendations  Treatment Recommendations  Defer until completion of intrumental exam        05/13/2023   12:00 PM  Prognosis  Prognosis for improved oropharyngeal function Good  Barriers to Reach Goals --  Barriers/Prognosis Comment --       05/13/2023   12:00 PM  Diet Recommendations  SLP Diet Recommendations Ice chips PRN after oral care;NPO  Liquid Administration via --   Medication Administration Via alternative means  Compensations --  Postural Changes --         05/13/2023   12:00 PM  Other Recommendations  Recommended Consults --  Oral Care Recommendations Oral care QID;Oral care before and after PO  Caregiver Recommendations --  Follow Up Recommendations --  Assistance recommended at discharge --  Functional Status Assessment --       05/10/2023    1:16 PM  Frequency and Duration   Speech Therapy Frequency (ACUTE ONLY) min 1 x/week  Treatment Duration 1 week;2 weeks         05/13/2023   12:00 PM  Oral Phase  Oral Phase WFL  Oral - Pudding Teaspoon --  Oral - Pudding Cup --  Oral - Honey Teaspoon --  Oral - Honey Cup --  Oral - Nectar Teaspoon --  Oral - Nectar Cup --  Oral - Nectar Straw --  Oral - Thin Teaspoon --  Oral - Thin Cup --  Oral - Thin Straw --  Oral - Puree --  Oral - Mech Soft --  Oral - Regular --  Oral - Multi-Consistency --  Oral - Pill --  Oral Phase - Comment --       05/13/2023   12:00 PM  Pharyngeal Phase  Pharyngeal Phase --  Pharyngeal- Pudding Teaspoon --  Pharyngeal --  Pharyngeal- Pudding Cup --  Pharyngeal --  Pharyngeal- Honey Teaspoon --  Pharyngeal --  Pharyngeal- Honey Cup --  Pharyngeal --  Pharyngeal- Nectar Teaspoon --  Pharyngeal --  Pharyngeal- Nectar Cup --  Pharyngeal --  Pharyngeal- Nectar Straw --  Pharyngeal --  Pharyngeal- Thin Teaspoon --  Pharyngeal --  Pharyngeal- Thin Cup --  Pharyngeal --  Pharyngeal- Thin Straw --  Pharyngeal --  Pharyngeal- Puree --  Pharyngeal --  Pharyngeal- Mechanical Soft --  Pharyngeal --  Pharyngeal- Regular --  Pharyngeal --  Pharyngeal- Multi-consistency --  Pharyngeal --  Pharyngeal- Pill --  Pharyngeal --  Pharyngeal Comment --         No data to display          Ferdinand Lango MA, CCC-SLP  Dencil Cayson Meryl 05/13/2023, 12:46 PM

## 2023-05-14 ENCOUNTER — Inpatient Hospital Stay (HOSPITAL_COMMUNITY)

## 2023-05-14 DIAGNOSIS — N179 Acute kidney failure, unspecified: Secondary | ICD-10-CM | POA: Diagnosis not present

## 2023-05-14 DIAGNOSIS — D696 Thrombocytopenia, unspecified: Secondary | ICD-10-CM | POA: Diagnosis not present

## 2023-05-14 DIAGNOSIS — A419 Sepsis, unspecified organism: Secondary | ICD-10-CM | POA: Diagnosis not present

## 2023-05-14 DIAGNOSIS — D649 Anemia, unspecified: Secondary | ICD-10-CM | POA: Diagnosis not present

## 2023-05-14 DIAGNOSIS — N1832 Chronic kidney disease, stage 3b: Secondary | ICD-10-CM

## 2023-05-14 DIAGNOSIS — J9601 Acute respiratory failure with hypoxia: Secondary | ICD-10-CM | POA: Diagnosis not present

## 2023-05-14 DIAGNOSIS — R7401 Elevation of levels of liver transaminase levels: Secondary | ICD-10-CM | POA: Diagnosis not present

## 2023-05-14 DIAGNOSIS — R4182 Altered mental status, unspecified: Secondary | ICD-10-CM

## 2023-05-14 DIAGNOSIS — I5023 Acute on chronic systolic (congestive) heart failure: Secondary | ICD-10-CM | POA: Diagnosis not present

## 2023-05-14 DIAGNOSIS — I10 Essential (primary) hypertension: Secondary | ICD-10-CM

## 2023-05-14 LAB — COMPREHENSIVE METABOLIC PANEL
ALT: 980 U/L — ABNORMAL HIGH (ref 0–44)
AST: 236 U/L — ABNORMAL HIGH (ref 15–41)
Albumin: 2.7 g/dL — ABNORMAL LOW (ref 3.5–5.0)
Alkaline Phosphatase: 131 U/L — ABNORMAL HIGH (ref 38–126)
Anion gap: 12 (ref 5–15)
BUN: 93 mg/dL — ABNORMAL HIGH (ref 8–23)
CO2: 21 mmol/L — ABNORMAL LOW (ref 22–32)
Calcium: 9 mg/dL (ref 8.9–10.3)
Chloride: 119 mmol/L — ABNORMAL HIGH (ref 98–111)
Creatinine, Ser: 2.5 mg/dL — ABNORMAL HIGH (ref 0.44–1.00)
GFR, Estimated: 19 mL/min — ABNORMAL LOW (ref >60)
Glucose, Bld: 96 mg/dL (ref 70–99)
Potassium: 4.2 mmol/L (ref 3.5–5.1)
Sodium: 152 mmol/L — ABNORMAL HIGH (ref 135–145)
Total Bilirubin: 3 mg/dL — ABNORMAL HIGH (ref 0.0–1.2)
Total Protein: 5.3 g/dL — ABNORMAL LOW (ref 6.5–8.1)

## 2023-05-14 LAB — DIFFERENTIAL
Abs Immature Granulocytes: 0.09 10*3/uL — ABNORMAL HIGH (ref 0.00–0.07)
Basophils Absolute: 0 10*3/uL (ref 0.0–0.1)
Basophils Relative: 0 %
Eosinophils Absolute: 0 10*3/uL (ref 0.0–0.5)
Eosinophils Relative: 0 %
Immature Granulocytes: 1 %
Lymphocytes Relative: 6 %
Lymphs Abs: 0.6 10*3/uL — ABNORMAL LOW (ref 0.7–4.0)
Monocytes Absolute: 1.1 10*3/uL — ABNORMAL HIGH (ref 0.1–1.0)
Monocytes Relative: 11 %
Neutro Abs: 8.2 10*3/uL — ABNORMAL HIGH (ref 1.7–7.7)
Neutrophils Relative %: 82 %

## 2023-05-14 LAB — CBC
HCT: 32.4 % — ABNORMAL LOW (ref 36.0–46.0)
HCT: 32.9 % — ABNORMAL LOW (ref 36.0–46.0)
Hemoglobin: 10.6 g/dL — ABNORMAL LOW (ref 12.0–15.0)
Hemoglobin: 10.8 g/dL — ABNORMAL LOW (ref 12.0–15.0)
MCH: 32 pg (ref 26.0–34.0)
MCH: 32.2 pg (ref 26.0–34.0)
MCHC: 32.7 g/dL (ref 30.0–36.0)
MCHC: 32.8 g/dL (ref 30.0–36.0)
MCV: 97.9 fL (ref 80.0–100.0)
MCV: 98.2 fL (ref 80.0–100.0)
Platelets: 33 10*3/uL — ABNORMAL LOW (ref 150–400)
Platelets: 34 10*3/uL — ABNORMAL LOW (ref 150–400)
RBC: 3.31 MIL/uL — ABNORMAL LOW (ref 3.87–5.11)
RBC: 3.35 MIL/uL — ABNORMAL LOW (ref 3.87–5.11)
RDW: 17.3 % — ABNORMAL HIGH (ref 11.5–15.5)
RDW: 17.7 % — ABNORMAL HIGH (ref 11.5–15.5)
WBC: 9.9 10*3/uL (ref 4.0–10.5)
WBC: 10.1 10*3/uL (ref 4.0–10.5)
nRBC: 0 % (ref 0.0–0.2)
nRBC: 0.5 % — ABNORMAL HIGH (ref 0.0–0.2)

## 2023-05-14 LAB — GLUCOSE, CAPILLARY
Glucose-Capillary: 111 mg/dL — ABNORMAL HIGH (ref 70–99)
Glucose-Capillary: 119 mg/dL — ABNORMAL HIGH (ref 70–99)
Glucose-Capillary: 157 mg/dL — ABNORMAL HIGH (ref 70–99)

## 2023-05-14 LAB — MAGNESIUM: Magnesium: 2.3 mg/dL (ref 1.7–2.4)

## 2023-05-14 LAB — TECHNOLOGIST SMEAR REVIEW: Plt Morphology: NORMAL

## 2023-05-14 MED ORDER — OSMOLITE 1.5 CAL PO LIQD
600.0000 mL | ORAL | Status: DC
  Administered 2023-05-14 – 2023-05-15 (×2): 600 mL
  Filled 2023-05-14 (×3): qty 711
  Filled 2023-05-14 (×2): qty 1000

## 2023-05-14 MED ORDER — FUROSEMIDE 10 MG/ML IJ SOLN
60.0000 mg | Freq: Once | INTRAMUSCULAR | Status: AC
  Administered 2023-05-14: 60 mg via INTRAVENOUS
  Filled 2023-05-14: qty 6

## 2023-05-14 MED ORDER — ENSURE ENLIVE PO LIQD
237.0000 mL | Freq: Two times a day (BID) | ORAL | Status: DC
  Administered 2023-05-14 – 2023-05-21 (×13): 237 mL via ORAL

## 2023-05-14 NOTE — Progress Notes (Signed)
 Physical Therapy Treatment Patient Details Name: Heather [name] MRN: [id number] DOB: December 28, 1940 Today's Date: 05/14/2023   History of Present Illness 83 y/o woman admitted from [facility] with confusion, hypoglycemia, CAP and shock after wellfare check. PMhx: HFrEF, HTN, COPD, DM    PT Comments  Pt's RN stated pt was getting up and down from bedside commode and was very mobile. However, when PT entered pt reports she can not get up. Eventually, PT convinces pt to get to EoB, requiring maxA to come to seated EoB. Pt sits for about 8 minutes on EoB and reports she needs to get back in bed and that she is just waiting for the good Lord to call her home. Pt is modA for scooting up EoB and total A for returning to supine. When PT reported to RN that pt did not want to get up, RN reports that actually yesterday afternoon the patient had a similar response. PT will alert therapy staff to see her in the morning when she is more receptive to therapy.     If plan is discharge home, recommend the following: A little help with walking and/or transfers;A lot of help with bathing/dressing/bathroom;Assistance with cooking/housework;Assist for transportation   Can travel by private vehicle     Yes  Equipment Recommendations  Rolling walker (2 wheels);BSC/3in1       Precautions / Restrictions Precautions Precautions: Fall Restrictions Weight Bearing Restrictions Per Provider Order: No     Mobility  Bed Mobility Overal bed mobility: Needs Assistance Bed Mobility: Supine to Sit, Sit to Supine     Supine to sit: HOB elevated, Used rails, Max assist     General bed mobility comments: maxA for management of LE off bed and bringing trunk to upright, total A for returning LE to bed and for squaring hips and shoulders in bed    Transfers Overall transfer level: Needs assistance                Lateral/Scoot Transfers: Mod assist General transfer comment: pt refusing OOB, modA and use of bed pad to  scoot hips towards HoB before getting back to supine          Balance Overall balance assessment: Needs assistance Sitting-balance support: No upper extremity supported, Feet supported Sitting balance-Leahy Scale: Fair                                      Hotel manager: No apparent difficulties  Cognition Arousal: Alert Behavior During Therapy: Flat affect   PT - Cognitive impairments: No family/caregiver present to determine baseline, Difficult to assess, Orientation, Awareness, Memory, Attention, Initiation, Problem solving, Safety/Judgement   Orientation impairments: Time, Situation, Place                   PT - Cognition Comments: slow processing, needs assistance with initiating movement Following commands: Impaired Following commands impaired: Follows one step commands inconsistently, Follows one step commands with increased time    Cueing Cueing Techniques: Verbal cues, Gestural cues, Tactile cues, Visual cues     General Comments  VSS on RA      Pertinent Vitals/Pain Pain Assessment Pain Assessment: 0-10 Faces Pain Scale: Hurts little more Pain Location: B LE with movement Pain Descriptors / Indicators: Grimacing, Guarding Pain Intervention(s): Limited activity within patient's tolerance, Monitored during session, Repositioned    Home Living Family/patient expects to be discharged to:: Private  residence Living Arrangements: Alone Available Help at Discharge: Family;Available PRN/intermittently Type of Home: Apartment Home Access: Level entry       Home Layout: One level Home Equipment: Shower seat;BSC/3in1;Cane - single point;Rolling Environmental consultant (2 wheels) Additional Comments: Son, Heather [name] in room and confirms above information        PT Goals (current goals can now be found in the care plan section) Acute Rehab PT Goals PT Goal Formulation: Patient unable to participate in goal setting Time For Goal  Achievement: 05/24/23 Potential to Achieve Goals: Fair Progress towards PT goals: Not progressing toward goals - comment    Frequency    Min 2X/week       AM-PAC PT "6 Clicks" Mobility   Outcome Measure  Help needed turning from your back to your side while in a flat bed without using bedrails?: A Little Help needed moving from lying on your back to sitting on the side of a flat bed without using bedrails?: A Little Help needed moving to and from a bed to a chair (including a wheelchair)?: A Little Help needed standing up from a chair using your arms (e.g., wheelchair or bedside chair)?: A Little Help needed to walk in hospital room?: A Lot Help needed climbing 3-5 steps with a railing? : Total 6 Click Score: 15    End of Session Equipment Utilized During Treatment: Gait belt Activity Tolerance: Patient tolerated treatment well Patient left: in bed;with bed alarm set Nurse Communication: Mobility status PT Visit Diagnosis: Other abnormalities of gait and mobility (R26.89);Difficulty in walking, not elsewhere classified (R26.2);Muscle weakness (generalized) (M62.81)     Time: 0454-0981 PT Time Calculation (min) (ACUTE ONLY): 20 min  Charges:    $Therapeutic Activity: 8-22 mins PT General Charges $$ ACUTE PT VISIT: 1 Visit                     Heather [name] Heather [name] PT, DPT Acute Rehabilitation Services Please use secure chat or  Call Office [phone]    Heather [name] [facility] 05/14/2023, 3:59 PM

## 2023-05-14 NOTE — Consult Note (Addendum)
 [facility]  [phone] [phone]    HEMATOLOGY CONSULTATION  PURPOSE OF CONSULTATION/CHIEF COMPLAINT: Thrombocytopenia  Referring MD: Dr. Lanae Boast   HPI: Heather [name] is an 83 year old female patient who was brought into the ED on 05/08/2023 after police was called for a wellness visit and patient was found to have altered mental status. Workup was done in the ED including labs which showed low platelet count 82K, subsequently platelet count has continued to decrease.  Indeed platelet count 34K today at which time hematology consult has been requested. Patient is seen and assessed and examined today.  Seen sitting on bedside commode, assisted with transfer back to bed.  Reports that she feels terrible.  Appears to be oriented x 3 at this time.  Denies active bleeding, denies melena.  Admits to shortness of breath and intermittent cough.  Also admits to occasional headaches and dizziness. Past medical history significant for multiple comorbidities including but not limited to anemia, CKD/renal insufficiency, hypertension, diabetes, history of CVA, long-term use of opiate analgesic, chronic low back pain, polyarthropathy, hyperlipidemia, anxiety, COPD, prediabetes. Family history includes father with aneurysm, mother with hypertension, brother with diabetes.  No oncologic family history is noted. Social history includes 40 pack/year smoking history w/cigarettes which she quit 38 years ago.  Denies alcohol use.  Denies illicit drug use.  Patient states that she lives alone and has assistance at home.   ASSESSMENT AND PLAN:  Thrombocytopenia - Platelets low 33K today.  Platelets 82k on admission 05/08/2023 and has subsequently continued to decrease. - Likely iatrogenic related to CHF -Transfuse platelets for counts <20 K or <50 K with active bleeding.  No transfusional intervention required at this time - Peripheral smear review pending - Monitor CBC with  differential - Hematology/Dr. Myna Hidalgo will follow patient  Anemia History of anemia - Likely multifactorial due to renal insufficiency and comorbidities - Hemoglobin 10.6 today - Transfuse PRBC for Hgb <7.0.  No transfusional intervention required at this time - Continue to monitor CBC with differential  Transaminitis - Elevated liver enzymes, decreasing since admission - Continue to monitor CMP  Renal insufficiency/AKI - Elevated creatinine and BUN, decreasing since admission - Continue gentle hydration - Avoid nephrotoxic agents - Continue to monitor CMP  Diabetes Hypertension -Monitor blood pressure and blood sugar levels - Continue meds as ordered  CHF-cardiogenic shock Elevated troponin -Heart failure and medical teams following  AMS Acute metabolic encephalopathy - Patient brought to ED during welfare check when found to be confused - Patient appears to be oriented x 3, answers questions appropriately to person time and place - Continue to monitor closely   Past Medical History:  Diagnosis Date   Anemia    Anxiety    Arthritis    COPD (chronic obstructive pulmonary disease) (HCC)    CVA (cerebral vascular accident) (HCC)    Depression    Diabetes mellitus without complication (HCC)    Dry eyes 04/14/2014   GERD (gastroesophageal reflux disease)    Glaucoma    Gout    HTN (hypertension)    OSA (obstructive sleep apnea)   :  Past Surgical History:  Procedure Laterality Date   ABDOMINAL HYSTERECTOMY     BACK SURGERY     lumbar-disc   BIV PACEMAKER INSERTION CRT-P N/A 07/12/2022   Procedure: BIV PACEMAKER INSERTION CRT-P;  Surgeon: Duke Salvia, MD;  Location: [facility] INVASIVE CV LAB;  Service: Cardiovascular;  Laterality: N/A;   BUNIONECTOMY Bilateral    FOOT  SURGERY Left    repair of "kissing Cousins"   JOINT REPLACEMENT Bilateral    hip    JOINT REPLACEMENT Right 2010 or 2012   knee   KNEE SURGERY     RIGHT/LEFT HEART CATH AND CORONARY ANGIOGRAPHY  N/A 10/27/2021   Procedure: RIGHT/LEFT HEART CATH AND CORONARY ANGIOGRAPHY;  Surgeon: Corky Crafts, MD;  Location: [facility] INVASIVE CV LAB;  Service: Cardiovascular;  Laterality: N/A;   SHOULDER ARTHROSCOPY Right    shoulder surgery in [place] about 2000   SHOULDER HEMI-ARTHROPLASTY Left 03/25/2014   Procedure: LEFT SHOULDER HEMI-ARTHROPLASTY;  Surgeon: Vickki Hearing, MD;  Location: [facility];  Service: Orthopedics;  Laterality: Left;  :  Allergies  Allergen Reactions   Lovastatin Swelling    Patient states that her tongue swells and she gets a tingling feeling all over.   Lisinopril Swelling    Tongue swelling  :   Family History  Problem Relation Age of Onset   High blood pressure Mother    Aneurysm Father    Diabetes Brother    Pancreatitis Brother    Hypertension Niece    Congestive Heart Failure Niece   :   Social History   Socioeconomic History   Marital status: Divorced    Spouse name: Not on file   Number of children: Not on file   Years of education: Not on file   Highest education level: 9th grade  Occupational History   Not on file  Tobacco Use   Smoking status: Former    Current packs/day: 0.00    Average packs/day: 1 pack/day for 40.0 years (40.0 ttl pk-yrs)    Types: Cigarettes    Start date: 03/20/1945    Quit date: 03/20/1985    Years since quitting: 38.1   Smokeless tobacco: Never  Vaping Use   Vaping status: Never Used  Substance and Sexual Activity   Alcohol use: No   Drug use: No   Sexual activity: Yes    Birth control/protection: Surgical  Other Topics Concern   Not on file  Social History Narrative   Heather [name] is a 83 year old divorced female who lives alone. She has support of her son and sister.    Social Drivers of Health   Financial Resource Strain: Medium Risk (11/27/2022)   Overall Financial Resource Strain (CARDIA)    Difficulty of Paying Living Expenses: Somewhat hard  Food Insecurity: Patient Declined (05/09/2023)   Hunger  Vital Sign    Worried About Running Out of Food in the Last Year: Patient declined    Ran Out of Food in the Last Year: Patient declined  Transportation Needs: Patient Unable To Answer (05/09/2023)   PRAPARE - Transportation    Lack of Transportation (Medical): Patient unable to answer    Lack of Transportation (Non-Medical): Patient unable to answer  Physical Activity: Insufficiently Active (11/27/2022)   Exercise Vital Sign    Days of Exercise per Week: 3 days    Minutes of Exercise per Session: 40 min  Stress: Stress Concern Present (11/27/2022)   [employer] of Occupational Health - Occupational Stress Questionnaire    Feeling of Stress : To some extent  Social Connections: Patient Unable To Answer (05/09/2023)   Social Connection and Isolation Panel [NHANES]    Frequency of Communication with Friends and Family: Patient unable to answer    Frequency of Social Gatherings with Friends and Family: Patient unable to answer    Attends Religious Services: Patient unable to answer  Active Member of Clubs or Organizations: Patient unable to answer    Attends Club or Organization Meetings: Patient unable to answer    Marital Status: Patient unable to answer  Intimate Partner Violence: Patient Declined (05/09/2023)   Humiliation, Afraid, Rape, and Kick questionnaire    Fear of Current or Ex-Partner: Patient declined    Emotionally Abused: Patient declined    Physically Abused: Patient declined    Sexually Abused: Patient declined  :   CURRENT MEDS: Current Facility-Administered Medications  Medication Dose Route Frequency Provider Last Rate Last Admin   arformoterol (BROVANA) nebulizer solution 15 mcg  15 mcg Nebulization BID Leslye Peer, MD   15 mcg at 05/14/23 0840   budesonide (PULMICORT) nebulizer solution 0.5 mg  0.5 mg Nebulization BID Leslye Peer, MD   0.5 mg at 05/14/23 0840   cefTRIAXone (ROCEPHIN) 2 g in sodium chloride 0.9 % 100 mL IVPB  2 g Intravenous Q24H Leslye Peer, MD 200 mL/hr at 05/13/23 1357 2 g at 05/13/23 1357   docusate (COLACE) 50 MG/5ML liquid 100 mg  100 mg Oral BID PRN Leslye Peer, MD       doxycycline (VIBRAMYCIN) 100 mg in sodium chloride 0.9 % 250 mL IVPB  100 mg Intravenous Q12H Leslye Peer, MD 125 mL/hr at 05/14/23 1049 100 mg at 05/14/23 1049   famotidine (PEPCID) tablet 10 mg  10 mg Oral Daily Leslye Peer, MD   10 mg at 05/14/23 0943   feeding supplement (ENSURE ENLIVE / ENSURE PLUS) liquid 237 mL  237 mL Oral BID BM Kc, Ramesh, MD       feeding supplement (OSMOLITE 1.5 CAL) liquid 600 mL  600 mL Per Tube Continuous Kc, Ramesh, MD       feeding supplement (PROSource TF20) liquid 60 mL  60 mL Per Tube Daily Leslye Peer, MD   60 mL at 05/13/23 1028   free water 200 mL  200 mL Per Tube Q4H Leslye Peer, MD   200 mL at 05/14/23 0940   furosemide (LASIX) injection 60 mg  60 mg Intravenous Once Sabharwal, Aditya, DO       melatonin tablet 3 mg  3 mg Oral QHS Byrum, Les Pou, MD       Oral care mouth rinse  15 mL Mouth Rinse 4 times per day Leslye Peer, MD   15 mL at 05/14/23 0940   Oral care mouth rinse  15 mL Mouth Rinse PRN Byrum, Les Pou, MD       phenol (CHLORASEPTIC) mouth spray 1 spray  1 spray Mouth/Throat PRN Leslye Peer, MD       polyethylene glycol (MIRALAX / GLYCOLAX) packet 17 g  17 g Oral Daily Leslye Peer, MD   17 g at 05/14/23 0981   revefenacin (YUPELRI) nebulizer solution 175 mcg  175 mcg Nebulization Daily Leslye Peer, MD   175 mcg at 05/14/23 0840   thiamine (VITAMIN B1) tablet 100 mg  100 mg Oral Daily Leslye Peer, MD   100 mg at 05/14/23 0940   venlafaxine (EFFEXOR) tablet 37.5 mg  37.5 mg Oral BID WC Leslye Peer, MD   37.5 mg at 05/14/23 0940   white petrolatum (VASELINE) gel   Topical PRN Leslye Peer, MD   Given at 05/11/23 2039    REVIEW OF SYSTEMS:   Constitutional: + Fatigue + generalized weakness, denies fevers, chills or abnormal night sweats Eyes: Denies  blurriness of vision, double vision or watery eyes Ears, nose, mouth, throat, and face: Denies mucositis or sore throat Respiratory: + Shortness of breath + cough Cardiovascular: Denies palpitation, chest discomfort or lower extremity swelling Gastrointestinal: + Diarrhea  Skin: Denies abnormal skin rashes Lymphatics: Denies new lymphadenopathy or easy bruising Neurological: Denies numbness, tingling or new weaknesses Behavioral/Psych: Mood is stable, no new changes  All other systems were reviewed with the patient and are negative.  PHYSICAL EXAMINATION: ECOG PERFORMANCE STATUS: 3 - Symptomatic, >50% confined to bed  Vitals:   05/14/23 0840 05/14/23 0841  BP:    Pulse:    Resp:    Temp:    SpO2: 94% 99%   Filed Weights   05/12/23 0500 05/13/23 0600 05/14/23 0310  Weight: 160 lb 7.9 oz (72.8 kg) (P) 158 lb 4.6 oz (71.8 kg) 160 lb 7.9 oz (72.8 kg)    GENERAL: alert, + chronically ill-appearing  SKIN: skin color, texture, turgor are normal, no rashes or significant lesions EYES: normal, conjunctiva are pink and non-injected, sclera clear OROPHARYNX: no exudate, no erythema and lips, buccal mucosa, and tongue normal  NECK: supple, thyroid normal size, non-tender, without nodularity LYMPH: no palpable lymphadenopathy in the cervical, axillary or inguinal LUNGS: clear to auscultation and percussion with normal breathing effort HEART: regular rate & rhythm and no murmurs and no lower extremity edema ABDOMEN: abdomen soft, non-tender and normal bowel sounds MUSCULOSKELETAL: no cyanosis of digits and no clubbing  PSYCH: alert & oriented x 3 with fluent speech NEURO: no focal motor/sensory deficits   LABS: Lab Results  Component Value Date   WBC 9.9 05/14/2023   WBC 10.1 05/14/2023   HGB 10.6 (L) 05/14/2023   HGB 10.8 (L) 05/14/2023   HCT 32.4 (L) 05/14/2023   HCT 32.9 (L) 05/14/2023   MCV 97.9 05/14/2023   MCV 98.2 05/14/2023   PLT 33 (L) 05/14/2023   PLT 34 (L)  05/14/2023    Lab Results  Component Value Date   WBC 9.9 05/14/2023   WBC 10.1 05/14/2023   HGB 10.6 (L) 05/14/2023   HGB 10.8 (L) 05/14/2023   HCT 32.4 (L) 05/14/2023   HCT 32.9 (L) 05/14/2023   PLT 33 (L) 05/14/2023   PLT 34 (L) 05/14/2023   GLUCOSE 96 05/14/2023   CHOL 172 08/17/2022   TRIG 45 08/17/2022   HDL 82 08/17/2022   LDLCALC 81 08/17/2022   ALT 980 (H) 05/14/2023   AST 236 (H) 05/14/2023   NA 152 (H) 05/14/2023   K 4.2 05/14/2023   CL 119 (H) 05/14/2023   CREATININE 2.50 (H) 05/14/2023   BUN 93 (H) 05/14/2023   CO2 21 (L) 05/14/2023   INR 1.9 (H) 05/12/2023   HGBA1C 6.0 (H) 03/09/2023    DG Swallowing Func-Speech Pathology Result Date: 05/13/2023 Table formatting from the original result was not included. Modified Barium Swallow Study Patient Details Name: DAYANNE YIU MRN: [id number] Date of Birth: 29-Mar-1940 Today's Date: 05/13/2023 HPI/PMH: HPI: 83yo female admitted 05/08/23 with weakness, AMS following police wellness visit. PMH: HFrEF, renal insuff, HTN, gout, longterm use of opiate analgesic, chronic low back pain, HLD, anxiety, COPD, GERD, AKI, DM, CKD4, sCHF. CXR = worsening airspace dz L>RLL. HeadCT = no acute finding Clinical Impression: Clinical Impression: Patient presents with a functional oropharyngeal swallow. Intermittent delay in swallow initiation noted with thin liquids, first bolus and when combined with pill, with resultant trace flash penetration of liquids. No frank penetration or aspiration observed. Trace-mild lingual, base of  tongue, and vallecular coating noted post swallow, suspect secondary to dry oral cavity and dried secretions/blood noted to coat epiglottis during FEES this am. This residue clears with subsequent swallows. Recommend dysphagia 2 solids, for energy conservation as patient with increased WOB during mastication, and thin liquids with SLP f/u for tolerance and potential to advance. Factors that may increase risk of adverse event in  presence of aspiration Rubye Oaks & Clearance Coots 2021): No data recorded Recommendations/Plan: Swallowing Evaluation Recommendations Swallowing Evaluation Recommendations Recommendations: PO diet PO Diet Recommendation: Dysphagia 2 (Finely chopped); Thin liquids (Level 0) Liquid Administration via: Cup; Straw Medication Administration: Whole meds with liquid Supervision: Staff to assist with self-feeding Swallowing strategies  : Slow rate; Small bites/sips Postural changes: Position pt fully upright for meals Oral care recommendations: Oral care BID (2x/day) Treatment Plan Treatment Plan Treatment recommendations: Therapy as outlined in treatment plan below Follow-up recommendations: No SLP follow up Functional status assessment: Patient has had a recent decline in their functional status and demonstrates the ability to make significant improvements in function in a reasonable and predictable amount of time. Treatment frequency: Min 1x/week Treatment duration: 1 week Interventions: Aspiration precaution training; Patient/family education; Trials of upgraded texture/liquids; Diet toleration management by SLP Recommendations Recommendations for follow up therapy are one component of a multi-disciplinary discharge planning process, led by the attending physician.  Recommendations may be updated based on patient status, additional functional criteria and insurance authorization. Assessment: Orofacial Exam: Orofacial Exam Oral Cavity: Oral Hygiene: -- (green lingual coating from FEES) Oral Cavity - Dentition: Dentures, top; Missing dentition; Other (Comment) Orofacial Anatomy: WFL Oral Motor/Sensory Function: WFL Anatomy: Anatomy: -- (suspected dried secretions on epiglottis) Boluses Administered: Boluses Administered Boluses Administered: Thin liquids (Level 0); Mildly thick liquids (Level 2, nectar thick); Moderately thick liquids (Level 3, honey thick); Puree; Solid  Oral Impairment Domain: Oral Impairment Domain Lip Closure:  No labial escape Tongue control during bolus hold: Cohesive bolus between tongue to palatal seal Bolus preparation/mastication: Slow prolonged chewing/mashing with complete recollection Bolus transport/lingual motion: Delayed initiation of tongue motion (oral holding) Oral residue: Trace residue lining oral structures Location of oral residue : Tongue; Palate Initiation of pharyngeal swallow : Pyriform sinuses  Pharyngeal Impairment Domain: Pharyngeal Impairment Domain Soft palate elevation: No bolus between soft palate (SP)/pharyngeal wall (PW) Laryngeal elevation: Complete superior movement of thyroid cartilage with complete approximation of arytenoids to epiglottic petiole Anterior hyoid excursion: Complete anterior movement Epiglottic movement: Complete inversion Laryngeal vestibule closure: Incomplete, narrow column air/contrast in laryngeal vestibule Pharyngeal stripping wave : Present - complete Pharyngeal contraction (A/P view only): N/A Pharyngoesophageal segment opening: Complete distension and complete duration, no obstruction of flow Tongue base retraction: Trace column of contrast or air between tongue base and PPW Pharyngeal residue: Trace residue within or on pharyngeal structures Location of pharyngeal residue: Valleculae; Tongue base  Esophageal Impairment Domain: Esophageal Impairment Domain Esophageal clearance upright position: Complete clearance, esophageal coating Pill: Pill Consistency administered: Thin liquids (Level 0) Thin liquids (Level 0): [facility] Penetration/Aspiration Scale Score: Penetration/Aspiration Scale Score 1.  Material does not enter airway: Thin liquids (Level 0); Mildly thick liquids (Level 2, nectar thick); Moderately thick liquids (Level 3, honey thick); Puree; Solid; Pill 2.  Material enters airway, remains ABOVE vocal cords then ejected out: Thin liquids (Level 0) Compensatory Strategies: No data recorded  General Information: Caregiver present: No  Diet Prior to this Study:  NPO   Temperature : Normal   Respiratory Status: Increased WOB (with activity)   Supplemental O2: Nasal cannula  History of Recent Intubation: No  Behavior/Cognition: Alert; Cooperative; Pleasant mood; Confused Self-Feeding Abilities: Able to self-feed Baseline vocal quality/speech: Normal Volitional Cough: Able to elicit Volitional Swallow: Able to elicit No data recorded Goal Planning: Prognosis for improved oropharyngeal function: Good No data recorded No data recorded No data recorded Consulted and agree with results and recommendations: Patient; Nurse Pain: Pain Assessment Pain Assessment: No/denies pain Pain Score: 0 Facial Expression: 0 Body Movements: 0 Muscle Tension: 0 Compliance with ventilator (intubated pts.): N/A Vocalization (extubated pts.): 0 CPOT Total: 0 End of Session: Start Time:SLP Start Time (ACUTE ONLY): 1422 Stop Time: SLP Stop Time (ACUTE ONLY): 1435 Time Calculation:SLP Time Calculation (min) (ACUTE ONLY): 13 min Charges: SLP Evaluations $ SLP Speech Visit: 1 Visit SLP Evaluations $MBS Swallow: 1 Procedure $FEES Swallow: 1 Procedure $Swallowing Treatment: 1 Procedure SLP visit diagnosis: SLP Visit Diagnosis: Dysphagia, oropharyngeal phase (R13.12) Past Medical History: Past Medical History: Diagnosis Date  Anemia   Anxiety   Arthritis   COPD (chronic obstructive pulmonary disease) (HCC)   CVA (cerebral vascular accident) (HCC)   Depression   Diabetes mellitus without complication (HCC)   Dry eyes 04/14/2014  GERD (gastroesophageal reflux disease)   Glaucoma   Gout   HTN (hypertension)   OSA (obstructive sleep apnea)  Past Surgical History: Past Surgical History: Procedure Laterality Date  ABDOMINAL HYSTERECTOMY    BACK SURGERY    lumbar-disc  BIV PACEMAKER INSERTION CRT-P N/A 07/12/2022  Procedure: BIV PACEMAKER INSERTION CRT-P;  Surgeon: Duke Salvia, MD;  [facility] INVASIVE CV LAB;  Service: Cardiovascular;  Laterality: N/A;  BUNIONECTOMY Bilateral   FOOT SURGERY Left   repair of  "kissing Cousins"  JOINT REPLACEMENT Bilateral   hip   JOINT REPLACEMENT Right 2010 or 2012  knee  KNEE SURGERY    RIGHT/LEFT HEART CATH AND CORONARY ANGIOGRAPHY N/A 10/27/2021  Procedure: RIGHT/LEFT HEART CATH AND CORONARY ANGIOGRAPHY;  Surgeon: Corky Crafts, MD;  Location: [facility] INVASIVE CV LAB;  Service: Cardiovascular;  Laterality: N/A;  SHOULDER ARTHROSCOPY Right   shoulder surgery in [place] about 2000  SHOULDER HEMI-ARTHROPLASTY Left 03/25/2014  Procedure: LEFT SHOULDER HEMI-ARTHROPLASTY;  Surgeon: Vickki Hearing, MD;  Location: [facility];  Service: Orthopedics;  Laterality: Left; Ferdinand Lango MA, CCC-SLP McCoy Leah Meryl 05/13/2023, 2:58 PM  DG Abd Portable 1V Result Date: 05/11/2023 CLINICAL DATA:  Feeding tube placement. EXAM: PORTABLE ABDOMEN - 1 VIEW COMPARISON:  CT 05/08/2023 FINDINGS: Tip of the weighted enteric tube in the left abdomen in the region of the mid distal stomach. IVC filter is partially included in the field of view. No bowel dilatation in the upper abdomen. IMPRESSION: Tip of the weighted enteric tube in the left abdomen in the region of the mid distal stomach. Electronically Signed   By: Narda Rutherford M.D.   On: 05/11/2023 17:54   ECHOCARDIOGRAM COMPLETE Result Date: 05/09/2023    ECHOCARDIOGRAM REPORT   Patient Name:   ELONI DARIUS Date of Exam: 05/09/2023 Medical Rec #:  [id number]      Height:       62.0 in Accession #:    [id number]     Weight:       162.3 lb Date of Birth:  27-Nov-1940      BSA:          1.749 m Patient Age:    82 years       BP:           103/50 mmHg Patient Gender: F  HR:           60 bpm. Exam Location:  Inpatient Procedure: 2D Echo, Cardiac Doppler, Color Doppler and Intracardiac            Opacification Agent (Both Spectral and Color Flow Doppler were            utilized during procedure). Indications:    Shock  History:        Patient has prior history of Echocardiogram examinations, most                 recent 11/27/2022. CHF and  Cardiomyopathy; Risk                 Factors:Hypertension and Diabetes.  Sonographer:    Amy Chionchio Referring Phys: Rutherford Guys, P IMPRESSIONS  1. Strain, 3D EF not performed Global hypokinesis worse in apex and septum with akinesis . Left ventricular ejection fraction, by estimation, is 25%. The left ventricle has normal function. The left ventricle demonstrates regional wall motion abnormalities (see scoring diagram/findings for description). The left ventricular internal cavity size was severely dilated. Left ventricular diastolic parameters are consistent with Grade I diastolic dysfunction (impaired relaxation). Elevated left ventricular end-diastolic pressure.  2. Device leads in RA/RV. Right ventricular systolic function is normal. The right ventricular size is normal.  3. Left atrial size was mildly dilated.  4. Right atrial size was mildly dilated.  5. The mitral valve is abnormal. Mild mitral valve regurgitation. No evidence of mitral stenosis.  6. Tricuspid valve regurgitation is moderate.  7. The aortic valve is tricuspid. There is moderate calcification of the aortic valve. There is moderate thickening of the aortic valve. Aortic valve regurgitation is not visualized. Aortic valve sclerosis is present, with no evidence of aortic valve stenosis.  8. The inferior vena cava is normal in size with greater than 50% respiratory variability, suggesting right atrial pressure of 3 mmHg. FINDINGS  Left Ventricle: Strain, 3D EF not performed Global hypokinesis worse in apex and septum with akinesis. Left ventricular ejection fraction, by estimation, is 25%. The left ventricle has normal function. The left ventricle demonstrates regional wall motion abnormalities. Definity contrast agent was given IV to delineate the left ventricular endocardial borders. Strain was performed and the global longitudinal strain is indeterminate. The left ventricular internal cavity size was severely dilated. There is no left  ventricular hypertrophy. Left ventricular diastolic parameters are consistent with Grade I diastolic dysfunction (impaired relaxation). Elevated left ventricular end-diastolic pressure. Right Ventricle: Device leads in RA/RV. The right ventricular size is normal. No increase in right ventricular wall thickness. Right ventricular systolic function is normal. Left Atrium: Left atrial size was mildly dilated. Right Atrium: Right atrial size was mildly dilated. Pericardium: There is no evidence of pericardial effusion. Mitral Valve: The mitral valve is abnormal. There is mild thickening of the mitral valve leaflet(s). Mild mitral valve regurgitation. No evidence of mitral valve stenosis. MV peak gradient, 7.6 mmHg. The mean mitral valve gradient is 3.0 mmHg. Tricuspid Valve: The tricuspid valve is normal in structure. Tricuspid valve regurgitation is moderate . No evidence of tricuspid stenosis. Aortic Valve: The aortic valve is tricuspid. There is moderate calcification of the aortic valve. There is moderate thickening of the aortic valve. Aortic valve regurgitation is not visualized. Aortic valve sclerosis is present, with no evidence of aortic valve stenosis. Aortic valve mean gradient measures 8.0 mmHg. Aortic valve peak gradient measures 12.7 mmHg. Aortic valve area, by VTI measures 1.50 cm. Pulmonic Valve: The  pulmonic valve was normal in structure. Pulmonic valve regurgitation is mild. No evidence of pulmonic stenosis. Aorta: The aortic root is normal in size and structure. Venous: The inferior vena cava is normal in size with greater than 50% respiratory variability, suggesting right atrial pressure of 3 mmHg. IAS/Shunts: No atrial level shunt detected by color flow Doppler. Additional Comments: 3D was performed not requiring image post processing on an independent workstation and was indeterminate.  LEFT VENTRICLE PLAX 2D LVIDd:         5.70 cm      Diastology LVIDs:         5.10 cm      LV e' medial:    2.94  cm/s LV PW:         1.00 cm      LV E/e' medial:  30.9 LV IVS:        0.80 cm      LV e' lateral:   4.13 cm/s LVOT diam:     1.90 cm      LV E/e' lateral: 22.0 LV SV:         51 LV SV Index:   29 LVOT Area:     2.84 cm  LV Volumes (MOD) LV vol d, MOD A4C: 306.0 ml LV vol s, MOD A4C: 226.0 ml LV SV MOD A4C:     306.0 ml RIGHT VENTRICLE            IVC RV Basal diam:  6.20 cm    IVC diam: 2.30 cm RV Mid diam:    3.90 cm RV S prime:     8.59 cm/s TAPSE (M-mode): 1.1 cm LEFT ATRIUM           Index        RIGHT ATRIUM           Index LA Vol (A4C): 82.7 ml 47.28 ml/m  RA Area:     27.70 cm                                    RA Volume:   105.00 ml 60.03 ml/m  AORTIC VALVE                     PULMONIC VALVE AV Area (Vmax):    1.51 cm      PV Vmax:       1.07 m/s AV Area (Vmean):   1.37 cm      PV Peak grad:  4.6 mmHg AV Area (VTI):     1.50 cm AV Vmax:           178.00 cm/s AV Vmean:          133.000 cm/s AV VTI:            0.341 m AV Peak Grad:      12.7 mmHg AV Mean Grad:      8.0 mmHg LVOT Vmax:         94.70 cm/s LVOT Vmean:        64.300 cm/s LVOT VTI:          0.181 m LVOT/AV VTI ratio: 0.53  AORTA Ao Root diam: 2.30 cm Ao Asc diam:  2.60 cm MITRAL VALVE MV Area (PHT): 1.98 cm     SHUNTS MV Area VTI:   1.23 cm     Systemic VTI:  0.18 m MV Peak grad:  7.6  mmHg     Systemic Diam: 1.90 cm MV Mean grad:  3.0 mmHg MV Vmax:       1.38 m/s MV Vmean:      76.0 cm/s MV Decel Time: 383 msec MV E velocity: 90.90 cm/s MV A velocity: 120.00 cm/s MV E/A ratio:  0.76 Charlton Haws MD Electronically signed by Charlton Haws MD Signature Date/Time: 05/09/2023/9:31:51 AM    Final    DG CHEST PORT 1 VIEW Result Date: 05/08/2023 CLINICAL DATA:  Central line EXAM: PORTABLE CHEST 1 VIEW COMPARISON:  05/08/2023, CT 05/08/2023 FINDINGS: Left-sided pacing device as before. Interval right central venous catheter tip over the SVC. No pneumothorax. Cardiomegaly with worsening airspace disease in the left greater than right lung base.  Possible small left effusion. Aortic atherosclerosis. Left shoulder replacement IMPRESSION: 1. Right IJ central venous catheter tip at the SVC.  No pneumothorax 2. Cardiomegaly 3. Worsening airspace disease at the left greater than right lung base which may be due to atelectasis or pneumonia. Possible small left effusion Electronically Signed   By: Jasmine Pang M.D.   On: 05/08/2023 20:39   CT CHEST ABDOMEN PELVIS WO CONTRAST Result Date: 05/08/2023 CLINICAL DATA:  Sepsis, hypoglycemia, altered mental status EXAM: CT CHEST, ABDOMEN AND PELVIS WITHOUT CONTRAST TECHNIQUE: Multidetector CT imaging of the chest, abdomen and pelvis was performed following the standard protocol without IV contrast. RADIATION DOSE REDUCTION: This exam was performed according to the departmental dose-optimization program which includes automated exposure control, adjustment of the mA and/or kV according to patient size and/or use of iterative reconstruction technique. COMPARISON:  08/27/2001 by report only FINDINGS: CT CHEST FINDINGS Cardiovascular: Left subclavian transvenous pacemaker. Cardiomegaly with biventricular enlargement. No pericardial effusion. Coronary and aortic calcifications. Central great vessels normal in caliber. Mediastinum/Nodes: No hematoma, mass, or adenopathy. Lungs/Pleura: No significant pleural effusion. No pneumothorax. Dependent atelectasis/consolidation in both lower lobes. 7 mm nodule, lateral right middle lobe (Im90,Se3) . Musculoskeletal: Post left shoulder arthroplasty. Right shoulder DJD. CT ABDOMEN PELVIS FINDINGS Hepatobiliary: Hyperdense contents of the nondilated gallbladder. No focal liver lesion or biliary ductal dilatation. Pancreas: Unremarkable. No pancreatic ductal dilatation or surrounding inflammatory changes. Spleen: Normal in size without focal abnormality. Adrenals/Urinary Tract: No adrenal mass. No urolithiasis or hydronephrosis. Right kidney is ptotic. Hyperdense 1.1 cm left upper pole  and 1.3 cm right mid pole renal lesions, possibly hyperdense cysts but nonspecific. Urinary bladder is nondilated, significant image degradation secondary to streak artifact from hip hardware. Stomach/Bowel: Stomach is nondilated. Small bowel decompressed. The colon is incompletely distended, without acute finding. Vascular/Lymphatic: Heavy aortoiliac calcified plaque without aneurysm. Infrarenal TrapEase IVC filter. No abdominal or pelvic adenopathy localized. Reproductive: Limited assessment due to significant streak artifact from hip hardware. Other: Small volume presacral fluid. No abdominal ascites. No free air. Musculoskeletal: Right spigelian hernia containing only fat. Advanced lower lumbar facet DJD, likely accounting for the grade 1 anterolisthesis L5-S1. Bilateral hip arthroplasty hardware resulting in significant regional streak artifact. IMPRESSION: 1. Dependent atelectasis/consolidation in both lower lobes. 2. Coronary and Aortic Atherosclerosis (ICD10-170.0). 3. 7 mm right middle lobe nodule. Non-contrast chest CT at 6-12 months is recommended. If the nodule is stable at time of repeat CT, then future CT at 18-24 months (from today's scan) is considered optional for low-risk patients, but is recommended for high-risk patients. This recommendation follows the consensus statement: Guidelines for Management of Incidental Pulmonary Nodules Detected on CT Images: From the Fleischner Society 2017; Radiology 2017; 284:228-243. 4. Right Spigelian hernia containing only fat. Electronically Signed  By: Corlis Leak M.D.   On: 05/08/2023 14:57   DG Chest Port 1 View Result Date: 05/08/2023 CLINICAL DATA:  Questionable sepsis - evaluate for abnormality EXAM: PORTABLE CHEST - 1 VIEW COMPARISON:  07/12/2022 FINDINGS: Some increase in atelectasis or infiltrate at the left lung base. Right lung clear. Heart size upper limits normal. Aortic Atherosclerosis (ICD10-170.0). Stable left subclavian transvenous pacemaker.  Persistent blunting of the left lateral costophrenic angle. Post left shoulder arthroplasty. IMPRESSION: Increasing left basilar atelectasis or infiltrate. Electronically Signed   By: Corlis Leak M.D.   On: 05/08/2023 14:46   CT Head Wo Contrast Result Date: 05/08/2023 CLINICAL DATA:  Mental status change of unknown cause. Hypoglycemia. EXAM: CT HEAD WITHOUT CONTRAST TECHNIQUE: Contiguous axial images were obtained from the base of the skull through the vertex without intravenous contrast. RADIATION DOSE REDUCTION: This exam was performed according to the departmental dose-optimization program which includes automated exposure control, adjustment of the mA and/or kV according to patient size and/or use of iterative reconstruction technique. COMPARISON:  09/10/2021 FINDINGS: Brain: Age related volume loss. Mild chronic small-vessel ischemic change of the white matter. No evidence of acute infarction, mass lesion, hemorrhage, hydrocephalus or extra-axial collection. Vascular: There is atherosclerotic calcification of the major vessels at the base of the brain. Skull: Negative Sinuses/Orbits: Clear/normal Other: None IMPRESSION: No acute CT finding. Age related volume loss. Mild chronic small-vessel ischemic change of the white matter. Electronically Signed   By: Paulina Fusi M.D.   On: 05/08/2023 14:29     The total time spent in the appointment was 55 minutes encounter with patients including review of chart and various tests results, discussions about plan of care and coordination of care plan   All questions were answered. The patient knows to call the clinic with any problems, questions or concerns. No barriers to learning was detected.  Thank you for the courtesy of this consultation, Dawson Bills, NP  3/10/202512:51 PM   ADDENDUM: I saw and examined Heather [name].  She is incredibly charming.  I noted that her platelets have been dropping.  I have to believe that this is somehow related to her  exacerbation of congestive heart failure and that she has poor marrow circulation blood.  She recently had been on Rocephin.  This could certainly have been a factor with the platelet count dropping.  I looked at her blood smear under the microscope.  She certainly had quite a few irregular red blood cell shapes.  She had target cells.  She had acanthocytes.  There were couple schistocytes.  She had some teardrops.  I saw a rare nucleated red blood cell.  She had no rouleaux formation.  The white cells appeared pretty much normal and more pathology maturation.  I did not see any hypersegmented polys.  Her platelets however were small.  As such, I think that the blood smear is indicative of a bone marrow hypoproliferative process.  Again, I had to believe this is somehow related to her cardiomyopathy.  Of note, she has significant hepatic inflammation.  Her bilirubin is 3.  Her SGPT 980 SGOT 236.  This is a whole lot better than when she came in.  As such, she could have had transient hepatic failure which would lead to her platelets dropping.  Patient also had renal insufficiency.  This seems to be improving.  Today, her BUN is 93 creatinine 2.5.  Again, I think that the thrombocytopenia clearly is reflective of her poor cardiac function.  On her echocardiogram that she had recently, her ejection fraction was less than 25%.  She had a CT scan of the body on I think 05/08/2023.  This was relatively unremarkable.  She had a 7 mm right middle lobe nodule.  There was nothing on the CT scan that suggested splenomegaly.   Of note, her coagulopathy also seems to be improving.  When last checked 2 days ago, the INR was down to 1.9.  On 05/09/2023 the INR was 4.8.  I think there will take some patience for her platelet count to come back up.  I do not think she needs to be transfused.  I do not think that a bone marrow biopsy is necessary.  I would just try to give this a little bit of time.  Again, Heather [name]  is a very charming.  I think a grandson was with her.  We will follow along.  Christin Bach, MD  Jeri Modena 33:3

## 2023-05-14 NOTE — Progress Notes (Addendum)
 Nutrition Follow-up  DOCUMENTATION CODES:   Non-severe (moderate) malnutrition in context of chronic illness  INTERVENTION:   -48 hour calorie count -Transition to nocturnal feeds:   Osmolite 1.5 @ 50 ml/hr over 12 hours (2000-0800)  60 ml Prosource TF daily  200 ml free water flush every 4 hours per MD  Tube feeding regimen provides 980 kcal (65% of needs), 58 grams of protein, and 457 ml of H2O. Total free water: 1657 ml daily   -Feeding assistance with meals -Ensure Enlive po BID, each supplement provides 350 kcal and 20 grams of protein.  -Magic cup TID with meals, each supplement provides 290 kcal and 9 grams of protein  -Monitor magnesium and phosphorus x 3 days, MD to replete as needed, as pt is at risk for refeeding syndrome given malnutrition. Thiamine 100mg  x 5 days  NUTRITION DIAGNOSIS:   Moderate Malnutrition related to chronic illness (COPD, CHF) as evidenced by severe muscle depletion, mild fat depletion.  Ongoing  GOAL:   Patient will meet greater than or equal to 90% of their needs  Progressing   MONITOR:   TF tolerance, I & O's, Labs, Weight trends  REASON FOR ASSESSMENT:   Consult Enteral/tube feeding initiation and management  ASSESSMENT:   Pt with a hx of HTN, CHF, COPD, GERD, gout, prior CVA, CKD4, and DM presented to ED after pt found with AMS and CBG of 40 on a wellness check.  3/4 - admitted to ICU 3/6 - BSE, NPO recommended 3/7 - cortrak placement pending (per [facility] on 05/11/23- tip of tube in stomach) 3/9- s/p BSE, FEES, MBSS- advanced to dysphagia 2 diet with thin liquids  Reviewed I/O's: +700 ml x 24 hours and +2.4 L since admission   Pt unavailable at time of visit. Attempted to speak with pt via call to hospital room phone, however, unable to reach.   Per chart review, pt more alert with less confusion yesterday. Pt has been advanced to a dysphagia 2 diet with thin liquids. No meal completion data available to assess at this time.    Discussed with RN via secure chat. Plan to transition to nocturnal feeds and will obtain calorie count to better assess PO intake/ readiness to remove cortrak tube.   Wt has been stable over the past week.   Medications reviewed and include pepcid, melatonin, lasix, miralax, thiamine, and rocephin.   Labs reviewed: K, Mg, and Phos WDL. Na: 152, CBGS: 101-153 (inpatient orders for glycemic control are none).    Diet Order:   Diet Order             DIET DYS 2 Room service appropriate? Yes; Fluid consistency: Thin  Diet effective now                   EDUCATION NEEDS:   Not appropriate for education at this time  Skin:  Skin Assessment: Reviewed RN Assessment  Last BM:  05/12/23  Height:   Ht Readings from Last 1 Encounters:  05/08/23 5\' 2"  (1.575 m)    Weight:   Wt Readings from Last 1 Encounters:  05/14/23 72.8 kg    Ideal Body Weight:  50 kg  BMI:  Body mass index is 29.35 kg/m.  Estimated Nutritional Needs:   Kcal:  1500-1700 kcal/d  Protein:  75-90g/d  Fluid:  1.5L/d    Levada Schilling, RD, LDN, CDCES Registered Dietitian III Certified Diabetes Care and Education Specialist If unable to reach this RD, please use "RD Inpatient" group  chat on secure chat between hours of 8am-4 pm daily

## 2023-05-14 NOTE — Hospital Course (Addendum)
 83 year old female with multiple comorbidities including CKD IV B/L creat ~2-2.4,, T2DM chronic systolic CHF with EF 20-25% BiV PM in place since 07/2022, chronic pain syndrome and other comorbidities who on holdby police 3/4 found to have altered mental status last known normal the day prior, was hypoglycemic at 40 and brought to the ED which she was profoundly acidotic pH 6.69 pCO2 54, and found to have acute kidney failure, significant transaminitis lactic acidosis leukocytosis fever concerning for shock, and admitted to ICU on 05/08/23-on vasopressors bicarb drip broad-spectrum antibiotics and IV fluids. -Vasopressors subsequently weaned off, core track placed 3/7 for nutrition 3/10: Transferred to floor under [facility] service  Significant imaging/procedure: TH : No acute CT finding. Age related volume loss. Mild chronic small-vessel ischemic change of the white matter CT CAP: Dependent atelectasis/consolidation in both lower lobes. 7 mm right middle lobe nodule. Right Spigelian hernia containing only fat.  CXR: Cardiomegaly. Worsening airspace disease at the left > right lung base which may be due to atelectasis or pneumonia. Possible small left effusion. ECHO: G HKN worse apex and septum with akinesis. LVEF 25% with regional wall motion abnormalities. Severely enlarged LV. G I DD. RV function normal.  Consultation: PCCM-admitting Cardiology-[facility] team Hematology oncology Palliative care

## 2023-05-14 NOTE — Plan of Care (Signed)
  Problem: Education: Goal: Knowledge of General Education information will improve Description: Including pain rating scale, medication(s)/side effects and non-pharmacologic comfort measures Outcome: Not Progressing   Pt is still confused unable to educate  Problem: Health Behavior/Discharge Planning: Goal: Ability to manage health-related needs will improve Outcome: Progressing   Problem: Clinical Measurements: Goal: Ability to maintain clinical measurements within normal limits will improve Outcome: Progressing  Goal: Will remain free from infection Outcome: Progressing Goal: Cardiovascular complication will be avoided Outcome: Progressing   Problem: Coping: Goal: Level of anxiety will decrease Outcome: Not Progressing  Pt is still anxious d/t confusion  Problem: Elimination: Goal: Will not experience complications related to bowel motility Outcome: Progressing Goal: Will not experience complications related to urinary retention Outcome: Progressing   Problem: Pain Managment: Goal: General experience of comfort will improve and/or be controlled Outcome: Progressing   Problem: Safety: Goal: Ability to remain free from injury will improve Outcome: Not Progressing  Pt still hops up & does not call when she needs the restroom   Problem: Skin Integrity: Goal: Risk for impaired skin integrity will decrease Outcome: Progressing

## 2023-05-14 NOTE — Progress Notes (Signed)
 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 -Vasopressors subsequently weaned off, core track placed 3/7 for nutrition 3/10: Transfer to floor under TRH service  Significant imaging/procedure: TH : No acute CT finding. Age related volume loss. Mild chronic small-vessel ischemic change of the white matter CT CAP: Dependent atelectasis/consolidation in both lower lobes. 7 mm right middle lobe nodule. Right Spigelian hernia containing only fat.  CXR: Cardiomegaly. Worsening airspace disease at the left > right lung base which may be due to atelectasis or pneumonia. Possible small left effusion. ECHO: G HKN worse apex and septum with akinesis. LVEF 25% with regional wall motion abnormalities. Severely enlarged LV. G I DD. RV function normal.  Consultation: PCCM-admitting   Subjective: Seen and examined this morning Alert awake oriented to self place but not to date "PEG tube in place, on room air   Assessment and Plan: Principal Problem:   Acute sepsis [facility]) Active Problems:   Cardiogenic shock (HCC)   Malnutrition of moderate degree   Shock-likely combination of sepsis and cardiogenic Severe acidosis Elevated troponin from demand ischemia: Patient managed IV antibiotics through 3/10.  Initially  needed pressors, off pressors since 3/8. Blood culture from 3/4 NGTD.  Respiratory virus panel negative.  MRSA PCR negative Monitor vitals, urine output.  EF 25%. Recent Labs  Lab 05/08/23 1155 05/08/23 1349 05/08/23 1512 05/08/23 1805 05/09/23 0536 05/09/23 0650 05/10/23 0344 05/11/23 0516 05/12/23 0310 05/13/23 0458 05/14/23 0531  WBC 12.4*  --   --  12.3*   < >  --  11.6* 11.3* 11.1* 8.4 9.9  LATICACIDVEN >9.0* >9.0* >9.0*  --   --  3.0*  --   --   --   --   --   PROCALCITON  --   --   --  2.21  --   --   --   --   --   --   --    < > = values in this interval not displayed.    Acute on chronic systolic CHF-cardiogenic shock NICM with EF 25%-previously 20 to 25% September 2024 BiV PCM in place since 07/2022 Elevated troponin likely demand ischemia: Current echo shows EF 25% with RWMA,G HKN, enlarged LV off pressors.  Holding Lasix metolazone given order diuresis.  Home bisoprolol, hydralazine, Lasix, Imdur and amiodarone has been on hold since admission. Cardiology has not been consulted yet and will discuss with them given concern for cardiogenic shock-hopefully can resume home meds slowly.  Cont to monitor daily I/O,weight, electrolytes and net balance as below.Keep on  salt/fluid restricted diet and monitor in tele. Net IO Since Admission: 1,994.6 mL [05/14/23 1150]  Filed Weights   05/12/23 0500 05/13/23 0600 05/14/23 0310  Weight: 72.8 kg (P) 71.8 kg 72.8 kg    Recent Labs  Lab 05/08/23 2255 05/09/23 0428 05/10/23 0344 05/11/23 0516 05/12/23 0310 05/13/23 0458 05/14/23 0531  BNP >4,500.0*  --   --   --   --   --   --   BUN  --    < > 119* 116* 114* 98* 93*  CREATININE  --    < > 4.87* 4.24* 3.72* 2.88* 2.50*  K  --    < > 3.3* 3.5 3.4* 3.6 4.2  MG  --    < > 2.3 2.2 2.4 2.3 2.3   < > = values in this interval not displayed.    Acute hypoxic respiratory failure CAP COPD history: Completed antibiotics 3/10.  Continue budesonide [name] (on Trelegy at  home).  Wean oxygen to room air as able.  AKI on CKD stage IV: Baseline creatinine 2.1-2.4.  Creatinine nicely improved to 2.5 close to baseline.  Has mild metabolic acidosis.  Monitor daily.  Acute metabolic encephalopathy: Patient confused during welfare check and brought to the ED. Facility present, continue delirium precaution.  Moderate malnutrition/dysphagia: Augment diet. Speech following-DYS 2 diet started 3/9.On NGT feeding -wean slowly-starting calorie count today  HLD: Holding statin due to transaminitis.  Transaminitis Elevated INR/coagulopathy: Likely shock liver.  Monitor LFTs-appears to be downtrending.  Chronic anemia: Likely from chronic kidney disease hemoglobin stable at 10 mg. Recent Labs  Lab 05/10/23 0344 05/11/23 0516 05/12/23 0310 05/13/23 0458 05/14/23 0531  HGB 10.6* 10.6* 10.3* 10.2* 10.6*  HCT 30.1* 30.3* 29.9* 30.4* 32.4*    Acute thrombocytopenia: Platelet on admit 132k now at 33K -will order peripheral smear, requested Dr. Myna Hidalgo to evaluate, suspect sepsis related.  Subcu heparin stopped 3/9. Recent Labs  Lab 05/10/23 0344 05/11/23 0516 05/12/23 0310 05/13/23 0458 05/14/23 0531  PLT 73* 53* 46* 36* 33*    Hypernatremia: Likely from patient's after diuresis.  Monitor labs closely.  Free water intake. Recent Labs  Lab 05/10/23 0344 05/11/23 0516 05/12/23 0310 05/13/23 0458 05/14/23 0531  NA 139 143 145 152* 152*    Type 2 diabetes mellitus with hypoglycemia: CBG 40 for EMS. PTA on Jardiance and currently on hold.  7 mm RML nodule: Needs follow-up in 6 to 12 months and pulmonary or pcp to arrange.  Chronic back pain Lumbar radiculitis Anxiety: Effexor to resume once able to swallow.  Holding narcotics due to confusion.  DVT prophylaxis: SCDs Start: 05/08/23 1806 Code Status:   Code Status: Full Code Family Communication: plan of care discussed with patient/none at bedside. Patient status is: Remains hospitalized because  of severity of illness Level of care: Progressive   Dispo: The patient is from: lives alone            Anticipated disposition: SNF  Objective: Vitals last 24 hrs: Vitals:   05/14/23 0310 05/14/23 0726 05/14/23 0840 05/14/23 0841  BP: 126/74 126/62    Pulse: 75     Resp: 16 16    Temp: 98.1 F (36.7 C) 97.8 F (36.6 C)    TempSrc: Oral Oral    SpO2: 95% 100% 94% 99%  Weight: 72.8 kg     Height:       Weight change:   Physical Examination: General exam: alert awake,at baseline, older than stated age, contractures present HEENT:Oral mucosa moist, Ear/Nose WNL grossly Respiratory system: Bilaterally clear BS,no use of accessory muscle Cardiovascular system: S1 & S2 +, No JVD. Gastrointestinal system: Abdomen soft,NT,ND, BS+ Nervous System: Alert, awake, oriented to self current place moving all extremities,and following commands. Extremities: LE edema +,distal peripheral pulses palpable and warm.  Skin: No rashes,no icterus. MSK: Normal  muscle bulk,tone, power   Medications reviewed:  Scheduled Meds:  arformoterol  15 mcg Nebulization BID   budesonide (PULMICORT) nebulizer solution  0.5 mg Nebulization BID   famotidine  10 mg Oral Daily   feeding supplement  237 mL Oral BID BM   feeding supplement (PROSource TF20)  60 mL Per Tube Daily   free water  200 mL Per Tube Q4H   furosemide  60 mg Intravenous Once   melatonin  3 mg Oral QHS   mouth rinse  15 mL Mouth Rinse 4 times per day   polyethylene glycol  17 g Oral Daily   revefenacin  175 mcg Nebulization Daily   thiamine  100 mg Oral Daily   venlafaxine  37.5 mg Oral BID WC   Continuous Infusions:  cefTRIAXone (ROCEPHIN)  IV 2 g (05/13/23 1357)   doxycycline (VIBRAMYCIN) IV 100 mg (05/14/23 1049)   feeding supplement (OSMOLITE 1.5 CAL)      Diet Order             DIET DYS 2 Room service appropriate? Yes; Fluid consistency: Thin  Diet effective now                  Intake/Output Summary (Last 24 hours) at  05/14/2023 1150 Last data filed at 05/14/2023 1146 Gross per 24 hour  Intake 600 ml  Output 401 ml  Net 199 ml  Net IO Since Admission: 1,994.6 mL [05/14/23 1150]  Wt Readings from Last 3 Encounters:  05/14/23 72.8 kg  05/02/23 69.4 kg  04/09/23 66 kg   Unresulted Labs (From admission, onward)     Start     Ordered   05/15/23 0500  Basic metabolic panel  Daily,   R     Question:  Specimen collection method  Answer:  Lab=Lab collect   05/14/23 0834   05/15/23 0500  CBC  Daily,   R     Question:  Specimen collection method  Answer:  Lab=Lab collect   05/14/23 0834   05/14/23 0844  Technologist smear review  (Technologist smear review)  Add-on,   AD       Comments: Please prepare peripheral blood smear for Dr. Myna Hidalgo to review   Question:  Clinical information:  Answer:  Thrombocytopenia   05/14/23 0844   05/14/23 0844  Differential  (Technologist smear review)  Add-on,   AD       Question:  Specimen collection method  Answer:  Lab=Lab collect   05/14/23 0844   05/14/23 0500  Cooxemetry Panel (carboxy, met, total hgb, O2 sat)  Tomorrow morning,   R       Question:  Specimen collection method  Answer:  Unit=Unit collect   05/13/23 0850   05/10/23 1051  Expectorated Sputum Assessment w Gram Stain, Rflx to Resp Cult  Once,   R        05/10/23 1050          Data Reviewed: I have personally reviewed following labs and imaging studies CBC: Recent Labs  Lab 05/08/23 1155 05/08/23 1212 05/10/23 0344 05/11/23 0516 05/12/23 0310 05/13/23 0458 05/14/23 0531  WBC 12.4*   < > 11.6* 11.3* 11.1* 8.4 9.9  NEUTROABS 11.0*  --   --   --   --   --   --   HGB 11.4*   < > 10.6* 10.6* 10.3* 10.2* 10.6*  HCT 36.8   < > 30.1* 30.3* 29.9* 30.4* 32.4*  MCV 104.8*   < >  91.5 92.1 93.1 93.8 97.9  PLT 132*   < > 73* 53* 46* 36* 33*   < > = values in this interval not displayed.   Basic Metabolic Panel:  Recent Labs  Lab 05/09/23 0428 05/10/23 0344 05/11/23 0516 05/12/23 0310  05/13/23 0458 05/14/23 0531  NA 140 139 143 145 152* 152*  K 4.0 3.3* 3.5 3.4* 3.6 4.2  CL 101 100 107 111 118* 119*  CO2 22 23 25 24 24  21*  GLUCOSE 149* 96 90 152* 130* 96  BUN 119* 119* 116* 114* 98* 93*  CREATININE 4.84* 4.87* 4.24* 3.72* 2.88* 2.50*  CALCIUM 8.0* 8.1* 8.5* 9.1 8.7* 9.0  MG 2.3 2.3 2.2 2.4 2.3 2.3  PHOS 7.5* 5.5* 4.5 4.4  --   --    GFR: Estimated Creatinine Clearance: 16.5 mL/min (A) (by C-G formula based on SCr of 2.5 mg/dL (H)). Liver Function Tests:  Recent Labs  Lab 05/09/23 0428 05/10/23 0344 05/11/23 0516 05/12/23 0310 05/14/23 0531  AST 4,931* 2,916* 1,410* 912* 236*  ALT 2,675* 2,618* 1,914* 1,666* 980*  ALKPHOS 163* 163* 160* 149* 131*  BILITOT 3.7* 3.9* 3.7* 4.0* 3.0*  PROT 4.6* 4.5* 4.4* 4.7* 5.3*  ALBUMIN 2.7* 2.6* 2.6* 2.8* 2.7*  No results for input(s): "LIPASE", "AMYLASE" in the last 168 hours.  Recent Labs  Lab 05/10/23 0344  AMMONIA 19  Coagulation Profile:  Recent Labs  Lab 05/08/23 1155 05/09/23 0428 05/10/23 0344 05/11/23 0516 05/12/23 0310  INR 3.2* 4.8* 3.9* 2.5* 1.9*  No results for input(s): "PROBNP" in the last 168 hours.  No results for input(s): "HGBA1C" in the last 72 hours. Recent Labs  Lab 05/13/23 0921 05/13/23 1343 05/13/23 1632 05/13/23 2113 05/14/23 0907  GLUCAP 153* 125* 101* 118* 111*   Recent Labs  Lab 05/08/23 1155 05/08/23 1349 05/08/23 1512 05/08/23 1805 05/09/23 0650  PROCALCITON  --   --   --  2.21  --   LATICACIDVEN >9.0* >9.0* >9.0*  --  3.0*   Recent Results (from the past 240 hours)  Blood Culture (routine x 2)     Status: None   Collection Time: 05/08/23 11:55 AM   Specimen: BLOOD  Result Value Ref Range Status   Specimen Description BLOOD RIGHT ANTECUBITAL  Final   Special Requests   Final    BOTTLES DRAWN AEROBIC AND ANAEROBIC Blood Culture adequate volume   Culture   Final    NO GROWTH 5 DAYS Performed at [facility], [address] [place], [place] [address]    Report  Status 05/13/2023 FINAL  Final  Blood Culture (routine x 2)     Status: None   Collection Time: 05/08/23 12:15 PM   Specimen: BLOOD  Result Value Ref Range Status   Specimen Description BLOOD BLOOD RIGHT WRIST  Final   Special Requests   Final    BOTTLES DRAWN AEROBIC ONLY Blood Culture results may not be optimal due to an inadequate volume of blood received in culture bottles   Culture   Final    NO GROWTH 5 DAYS Performed at [facility], [address] [place], [place] [address]    Report Status 05/13/2023 FINAL  Final  Resp panel by RT-PCR (RSV, Flu A&B, Covid) Anterior Nasal Swab     Status: None   Collection Time: 05/08/23 12:27 PM   Specimen: Anterior Nasal Swab  Result Value Ref Range Status   SARS Coronavirus 2 by RT PCR NEGATIVE NEGATIVE Final    Comment: (  NOTE) SARS-CoV-2 target nucleic acids are NOT DETECTED.  The SARS-CoV-2 RNA is generally detectable in upper respiratory specimens during the acute phase of infection. The lowest concentration of SARS-CoV-2 viral copies this assay can detect is 138 copies/mL. A negative result does not preclude SARS-Cov-2 infection and should not be used as the sole basis for treatment or other patient management decisions. A negative result may occur with  improper specimen collection/handling, submission of specimen other than nasopharyngeal swab, presence of viral mutation(s) within the areas targeted by this assay, and inadequate number of viral copies(<138 copies/mL). A negative result must be combined with clinical observations, patient history, and epidemiological information. The expected result is Negative.  Fact Sheet for Patients:  BloggerCourse.com  Fact Sheet for Healthcare Providers:  SeriousBroker.it  This test is no t yet approved or cleared by the [place] FDA and  has been authorized for detection and/or diagnosis of SARS-CoV-2 by FDA under an Emergency Use  Authorization (EUA). This EUA will remain  in effect (meaning this test can be used) for the duration of the COVID-19 declaration under Section 564(b)(1) of the Act, 21 U.S.C.section 360bbb-3(b)(1), unless the authorization is terminated  or revoked sooner.       Influenza A by PCR NEGATIVE NEGATIVE Final   Influenza B by PCR NEGATIVE NEGATIVE Final    Comment: (NOTE) The Xpert Xpress SARS-CoV-2/FLU/RSV plus assay is intended as an aid in the diagnosis of influenza from Nasopharyngeal swab specimens and should not be used as a sole basis for treatment. Nasal washings and aspirates are unacceptable for Xpert Xpress SARS-CoV-2/FLU/RSV testing.  Fact Sheet for Patients: BloggerCourse.com  Fact Sheet for Healthcare Providers: SeriousBroker.it  This test is not yet approved or cleared by the [place] FDA and has been authorized for detection and/or diagnosis of SARS-CoV-2 by FDA under an Emergency Use Authorization (EUA). This EUA will remain in effect (meaning this test can be used) for the duration of the COVID-19 declaration under Section 564(b)(1) of the Act, 21 U.S.C. section 360bbb-3(b)(1), unless the authorization is terminated or revoked.     Resp Syncytial Virus by PCR NEGATIVE NEGATIVE Final    Comment: (NOTE) Fact Sheet for Patients: BloggerCourse.com  Fact Sheet for Healthcare Providers: SeriousBroker.it  This test is not yet approved or cleared by the [place] FDA and has been authorized for detection and/or diagnosis of SARS-CoV-2 by FDA under an Emergency Use Authorization (EUA). This EUA will remain in effect (meaning this test can be used) for the duration of the COVID-19 declaration under Section 564(b)(1) of the Act, 21 U.S.C. section 360bbb-3(b)(1), unless the authorization is terminated or revoked.  Performed at [facility], [address],  [place], [place] [address]   Respiratory (~20 pathogens) panel by PCR     Status: None   Collection Time: 05/08/23  6:09 PM   Specimen: Nasopharyngeal Swab; Respiratory  Result Value Ref Range Status   Adenovirus NOT DETECTED NOT DETECTED Final   Coronavirus 229E NOT DETECTED NOT DETECTED Final    Comment: (NOTE) The Coronavirus on the Respiratory Panel, DOES NOT test for the novel  Coronavirus (2019 nCoV)    Coronavirus HKU1 NOT DETECTED NOT DETECTED Final   Coronavirus NL63 NOT DETECTED NOT DETECTED Final   Coronavirus OC43 NOT DETECTED NOT DETECTED Final   Metapneumovirus NOT DETECTED NOT DETECTED Final   Rhinovirus / Enterovirus NOT DETECTED NOT DETECTED Final   Influenza A NOT DETECTED NOT DETECTED Final   Influenza B NOT DETECTED NOT DETECTED  Final   Parainfluenza Virus 1 NOT DETECTED NOT DETECTED Final   Parainfluenza Virus 2 NOT DETECTED NOT DETECTED Final   Parainfluenza Virus 3 NOT DETECTED NOT DETECTED Final   Parainfluenza Virus 4 NOT DETECTED NOT DETECTED Final   Respiratory Syncytial Virus NOT DETECTED NOT DETECTED Final   Bordetella pertussis NOT DETECTED NOT DETECTED Final   Bordetella Parapertussis NOT DETECTED NOT DETECTED Final   Chlamydophila pneumoniae NOT DETECTED NOT DETECTED Final   Mycoplasma pneumoniae NOT DETECTED NOT DETECTED Final    Comment: Performed at [facility], [address], [place], [place] [address]  MRSA Next Gen by PCR, Nasal     Status: None   Collection Time: 05/08/23  6:13 PM   Specimen: Nasal Mucosa; Nasal Swab  Result Value Ref Range Status   MRSA by PCR Next Gen NOT DETECTED NOT DETECTED Final    Comment: (NOTE) The GeneXpert MRSA Assay (FDA approved for NASAL specimens only), is one component of a comprehensive MRSA colonization surveillance program. It is not intended to diagnose MRSA infection nor to guide or monitor treatment for MRSA infections. Test performance is not FDA approved in patients less than 69  years old. Performed at [facility], [address] [place], [place] [address]   Antimicrobials/Microbiology: Anti-infectives (From admission, onward)    Start     Dose/Rate Route Frequency Ordered Stop   05/09/23 1400  cefTRIAXone (ROCEPHIN) 2 g in sodium chloride 0.9 % 100 mL IVPB        2 g 200 mL/hr over 30 Minutes Intravenous Every 24 hours 05/09/23 1300 05/15/23 1344   05/09/23 1200  ceFEPIme (MAXIPIME) 1 g in sodium chloride 0.9 % 100 mL IVPB  Status:  Discontinued        1 g 200 mL/hr over 30 Minutes Intravenous Every 24 hours 05/08/23 1825 05/09/23 1300   05/08/23 2000  doxycycline (VIBRAMYCIN) 100 mg in sodium chloride 0.9 % 250 mL IVPB        100 mg 125 mL/hr over 120 Minutes Intravenous Every 12 hours 05/08/23 1912 05/15/23 1959   05/08/23 1820  vancomycin variable dose per unstable renal function (pharmacist dosing)  Status:  Discontinued         Does not apply See admin instructions 05/08/23 1821 05/09/23 1300   05/08/23 1300  vancomycin (VANCOREADY) IVPB 1250 mg/250 mL        1,250 mg 166.7 mL/hr over 90 Minutes Intravenous  Once 05/08/23 1258 05/08/23 1543   05/08/23 1245  ceFEPIme (MAXIPIME) 2 g in sodium chloride 0.9 % 100 mL IVPB        2 g 200 mL/hr over 30 Minutes Intravenous  Once 05/08/23 1243 05/08/23 1327   05/08/23 1245  metroNIDAZOLE (FLAGYL) IVPB 500 mg        500 mg 100 mL/hr over 60 Minutes Intravenous  Once 05/08/23 1243 05/08/23 1400   05/08/23 1245  vancomycin (VANCOCIN) IVPB 1000 mg/200 mL premix  Status:  Discontinued        1,000 mg 200 mL/hr over 60 Minutes Intravenous  Once 05/08/23 1243 05/08/23 1258         Component Value Date/Time   SDES BLOOD BLOOD RIGHT WRIST 05/08/2023 1215   SPECREQUEST  05/08/2023 1215    BOTTLES DRAWN AEROBIC ONLY Blood Culture results may not be optimal due to an inadequate volume of blood received in culture bottles   CULT  05/08/2023 1215    NO GROWTH 5 DAYS Performed at  [facility], [address] [place], [place] [address]    REPTSTATUS 05/13/2023 FINAL 05/08/2023 1215   Radiology Studies: DG Swallowing Func-Speech Pathology Result Date: 05/13/2023 Table formatting from the original result was not included. Modified Barium Swallow Study Patient Details Name: Heather [name] MRN: [id number] Date of Birth: Jul 11, 1940 Today's Date: 05/13/2023 HPI/PMH: HPI: 83yo female admitted 05/08/23 with weakness, AMS following police wellness visit. PMH: HFrEF, renal insuff, HTN, gout, longterm use of opiate analgesic, chronic low back pain, HLD, anxiety, COPD, GERD, AKI, DM, CKD4, sCHF. CXR = worsening airspace dz L>RLL. HeadCT = no acute finding Clinical Impression: Clinical Impression: Patient presents with a functional oropharyngeal swallow. Intermittent delay in swallow initiation noted with thin liquids, first bolus and when combined with pill, with resultant trace flash penetration of liquids. No frank penetration or aspiration observed. Trace-mild lingual, base of tongue, and vallecular coating noted post swallow, suspect secondary to dry oral cavity and dried secretions/blood noted to coat epiglottis during FEES this am. This residue clears with subsequent swallows. Recommend dysphagia 2 solids, for energy conservation as patient with increased WOB during mastication, and thin liquids with SLP f/u for tolerance and potential to advance. Factors that may increase risk of adverse event in presence of aspiration Rubye Oaks & Clearance Coots 2021): No data recorded Recommendations/Plan: Swallowing Evaluation Recommendations Swallowing Evaluation Recommendations Recommendations: PO diet PO Diet Recommendation: Dysphagia 2 (Finely chopped); Thin liquids (Level 0) Liquid Administration via: Cup; Straw Medication Administration: Whole meds with liquid Supervision: Staff to assist with self-feeding Swallowing strategies  : Slow rate; Small bites/sips Postural changes: Position pt fully upright for meals Oral care recommendations: Oral  care BID (2x/day) Treatment Plan Treatment Plan Treatment recommendations: Therapy as outlined in treatment plan below Follow-up recommendations: No SLP follow up Functional status assessment: Patient has had a recent decline in their functional status and demonstrates the ability to make significant improvements in function in a reasonable and predictable amount of time. Treatment frequency: Min 1x/week Treatment duration: 1 week Interventions: Aspiration precaution training; Patient/family education; Trials of upgraded texture/liquids; Diet toleration management by SLP Recommendations Recommendations for follow up therapy are one component of a multi-disciplinary discharge planning process, led by the attending physician.  Recommendations may be updated based on patient status, additional functional criteria and insurance authorization. Assessment: Orofacial Exam: Orofacial Exam Oral Cavity: Oral Hygiene: -- (green lingual coating from FEES) Oral Cavity - Dentition: Dentures, top; Missing dentition; Other (Comment) Orofacial Anatomy: WFL Oral Motor/Sensory Function: WFL Anatomy: Anatomy: -- (suspected dried secretions on epiglottis) Boluses Administered: Boluses Administered Boluses Administered: Thin liquids (Level 0); Mildly thick liquids (Level 2, nectar thick); Moderately thick liquids (Level 3, honey thick); Puree; Solid  Oral Impairment Domain: Oral Impairment Domain Lip Closure: No labial escape Tongue control during bolus hold: Cohesive bolus between tongue to palatal seal Bolus preparation/mastication: Slow prolonged chewing/mashing with complete recollection Bolus transport/lingual motion: Delayed initiation of tongue motion (oral holding) Oral residue: Trace residue lining oral structures Location of oral residue : Tongue; Palate Initiation of pharyngeal swallow : Pyriform sinuses  Pharyngeal Impairment Domain: Pharyngeal Impairment Domain Soft palate elevation: No bolus between soft palate  (SP)/pharyngeal wall (PW) Laryngeal elevation: Complete superior movement of thyroid cartilage with complete approximation of arytenoids to epiglottic petiole Anterior hyoid excursion: Complete anterior movement Epiglottic movement: Complete inversion Laryngeal vestibule closure: Incomplete, narrow column air/contrast in laryngeal vestibule Pharyngeal stripping wave : Present - complete Pharyngeal contraction (A/P view only): N/A Pharyngoesophageal segment opening: Complete distension and complete duration, no obstruction  of flow Tongue base retraction: Trace column of contrast or air between tongue base and PPW Pharyngeal residue: Trace residue within or on pharyngeal structures Location of pharyngeal residue: Valleculae; Tongue base  Esophageal Impairment Domain: Esophageal Impairment Domain Esophageal clearance upright position: Complete clearance, esophageal coating Pill: Pill Consistency administered: Thin liquids (Level 0) Thin liquids (Level 0): [facility] Penetration/Aspiration Scale Score: Penetration/Aspiration Scale Score 1.  Material does not enter airway: Thin liquids (Level 0); Mildly thick liquids (Level 2, nectar thick); Moderately thick liquids (Level 3, honey thick); Puree; Solid; Pill 2.  Material enters airway, remains ABOVE vocal cords then ejected out: Thin liquids (Level 0) Compensatory Strategies: No data recorded  General Information: Caregiver present: No  Diet Prior to this Study: NPO   Temperature : Normal   Respiratory Status: Increased WOB (with activity)   Supplemental O2: Nasal cannula   History of Recent Intubation: No  Behavior/Cognition: Alert; Cooperative; Pleasant mood; Confused Self-Feeding Abilities: Able to self-feed Baseline vocal quality/speech: Normal Volitional Cough: Able to elicit Volitional Swallow: Able to elicit No data recorded Goal Planning: Prognosis for improved oropharyngeal function: Good No data recorded No data recorded No data recorded Consulted and agree with  results and recommendations: Patient; Nurse Pain: Pain Assessment Pain Assessment: No/denies pain Pain Score: 0 Facial Expression: 0 Body Movements: 0 Muscle Tension: 0 Compliance with ventilator (intubated pts.): N/A Vocalization (extubated pts.): 0 CPOT Total: 0 End of Session: Start Time:SLP Start Time (ACUTE ONLY): 1422 Stop Time: SLP Stop Time (ACUTE ONLY): 1435 Time Calculation:SLP Time Calculation (min) (ACUTE ONLY): 13 min Charges: SLP Evaluations $ SLP Speech Visit: 1 Visit SLP Evaluations $MBS Swallow: 1 Procedure $FEES Swallow: 1 Procedure $Swallowing Treatment: 1 Procedure SLP visit diagnosis: SLP Visit Diagnosis: Dysphagia, oropharyngeal phase (R13.12) Past Medical History: Past Medical History: Diagnosis Date  Anemia   Anxiety   Arthritis   COPD (chronic obstructive pulmonary disease) (HCC)   CVA (cerebral vascular accident) (HCC)   Depression   Diabetes mellitus without complication (HCC)   Dry eyes 04/14/2014  GERD (gastroesophageal reflux disease)   Glaucoma   Gout   HTN (hypertension)   OSA (obstructive sleep apnea)  Past Surgical History: Past Surgical History: Procedure Laterality Date  ABDOMINAL HYSTERECTOMY    BACK SURGERY    lumbar-disc  BIV PACEMAKER INSERTION CRT-P N/A 07/12/2022  Procedure: BIV PACEMAKER INSERTION CRT-P;  Surgeon: Duke Salvia, MD;  Location: [facility] INVASIVE CV LAB;  Service: Cardiovascular;  Laterality: N/A;  BUNIONECTOMY Bilateral   FOOT SURGERY Left   repair of "kissing Cousins"  JOINT REPLACEMENT Bilateral   hip   JOINT REPLACEMENT Right 2010 or 2012  knee  KNEE SURGERY    RIGHT/LEFT HEART CATH AND CORONARY ANGIOGRAPHY N/A 10/27/2021  Procedure: RIGHT/LEFT HEART CATH AND CORONARY ANGIOGRAPHY;  Surgeon: Corky Crafts, MD;  Location: [facility] INVASIVE CV LAB;  Service: Cardiovascular;  Laterality: N/A;  SHOULDER ARTHROSCOPY Right   shoulder surgery in [place] about 2000  SHOULDER HEMI-ARTHROPLASTY Left 03/25/2014  Procedure: LEFT SHOULDER HEMI-ARTHROPLASTY;  Surgeon: Vickki Hearing, MD;  Location: [facility];  Service: Orthopedics;  Laterality: Left; Leah McCoy MA, CCC-SLP McCoy Leah Meryl 05/13/2023, 2:58 PM  LOS: 6 days   Total time spent in review of labs and imaging, patient evaluation, formulation of plan, documentation and communication with family: 50 minutes  Lanae Boast, MD  [facility]  05/14/2023, 11:50 AM

## 2023-05-14 NOTE — Consult Note (Signed)
 Advanced Heart Failure Team Consult Note   Primary Physician: [name], MD Cardiologist:  Marjo Bicker, MD  Reason for Consultation: acute on chronic systolic heart failure   HPI:    Heather [name] is seen today for evaluation of acute on chronic systolic heart failure at the request of Dr. Jonathon Bellows, Internal Medicine.   83 y.o. [name] w/ h/o CKD IIIb, LBBB, R cerebellar CVA and chronic biventricular heart failure first diagnosed 10/2021. Echo then showed severely reduced LVEF 15-20%, RV mildly reduced, mild-mod MR. Echo also showed dyssynchrony c/w LBBB. Of note, prior echo in 2017 showed normal LVEF w/ septal dyssynchrony and normal RV.   She underwent [facility] 10/2021 which showed mild nonobstructive CAD. 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 GDMT regimen included Farxiga, Imdur, Bisoprolol. Diuretic regimen Lasix 20 mg daily.    Echo 12/23 showed EF still severely reduced < 15%, progression of RV dysfunction (mod reduced) and severely elevated PA pressure w/ estimated RVSP 68 mmHg.     Admitted 1/24 with CHF exacerbation. Started on lasix gtt, and placed on empiric milrinone to augment diuresis. GDMT held w/ soft BP. cMRI showed LVEF 16%, mid-apical inferolateral subendocardial LGE, and moderately dilated RV with RVEF 23%. Milrinone was weaned and GDMT titrated. She was discharged home with HH, weight 150 lbs.   Echo 4/24 showed EF <20% with moderate LV enlargement, septal-lateral dyssynchrony, moderate RV dysfunction, PASP 65 mmHg.    She was referred to EP and underwent [employer] CRT-P placement 5/24. Echo 9/24, EF 20-25%, mild LV dilation, moderate RV dysfunction with normal size.    Follow up 9/24, BiV % down to 87%. 7 day Zio placed showing mostly NSR with 16.6% PVC burden. Amiodarone started.   Zio 7 day (10/24) showed mostly NSR with 16.6% PVR burden. Started on Amio. Repeat Zio 7 day (11/24) with  mostly NSR with <1% PVC burden.   Now admitted w/ mixed septic and cardiogenic shock. Initially in ICU. Septic shock felt secondary to b/l lower lobe PNA. Initially required NE and Milrinone. Treated w/ vanc and ceftriaxone initially, then abx deescalated to ceftriaxone and doxycycline. Echo showed EF 25%, RV normal, mod TR.  Shock has improved. Now off NE and weaned off milrinone. SCr trending down, 3.72>>2.88>>2.50 LFTs also improving, AST and ALT both > 2,000 initially, down to 236 and 980 respectively today. Transferred out of ICU. AHF team asked to further assist w/ HF management.  Na 152.   Also of note, pt w/ thrombocytopenia, Plts down to 33K today, 132 on admit. Not on heparin. Hgb 10.6, no gross bleeding.   She still feels mildly SOB,+ wheezing on exam. No longer w/ central access to check CVPs.     Echo    1. Strain, 3D EF not performed Global hypokinesis worse in apex and  septum with akinesis . Left ventricular ejection fraction, by estimation,  is 25%. The left ventricle has normal function. The left ventricle  demonstrates regional wall motion  abnormalities (see scoring diagram/findings for description). The left  ventricular internal cavity size was severely dilated. Left ventricular  diastolic parameters are consistent with Grade I diastolic dysfunction  (impaired relaxation). Elevated left  ventricular end-diastolic pressure.   2. Device leads in RA/RV. Right ventricular systolic function is normal.  The right ventricular size is normal.   3. Left atrial size was mildly dilated.   4. Right atrial  size was mildly dilated.   5. The mitral valve is abnormal. Mild mitral valve regurgitation. No  evidence of mitral stenosis.   6. Tricuspid valve regurgitation is moderate.   7. The aortic valve is tricuspid. There is moderate calcification of the  aortic valve. There is moderate thickening of the aortic valve. Aortic  valve regurgitation is not visualized. Aortic valve  sclerosis is present,  with no evidence of aortic valve  stenosis.   8. The inferior vena cava is normal in size with greater than 50%  respiratory variability, suggesting right atrial pressure of 3 mmHg.   Home Medications Prior to Admission medications   Medication Sig Start Date End Date Taking? Authorizing Provider  acetaminophen (TYLENOL) 500 MG tablet Take 500 mg by mouth every 6 (six) hours as needed for mild pain or moderate pain.    [provider]  albuterol ([name]) 108 (90 Base) MCG/ACT inhaler Inhale 2 puffs into the lungs every 6 (six) hours as needed for wheezing or shortness of breath. 01/12/23   Glenford Bayley, NP  allopurinol (ZYLOPRIM) 300 MG tablet TAKE ONE-HALF TABLET BY MOUTH EVERY DAY 03/23/23   [name], MD  amiodarone (PACERONE) 200 MG tablet Take 1 tablet (200 mg total) by mouth 2 (two) times daily for 14 days, THEN 1 tablet (200 mg total) daily. Patient taking differently: Patient takes 1 tablet by mouth daily. 12/20/22 01/03/24  Laurey Morale, MD  aspirin (ASPIRIN CHILDRENS) 81 MG chewable tablet Chew 1 tablet (81 mg total) by mouth daily. 05/05/22   Mallipeddi, Vishnu P, MD  atorvastatin (LIPITOR) 20 MG tablet Take 1 tablet (20 mg total) by mouth daily. 07/03/22 01/08/24  Laurey Morale, MD  bisoprolol (ZEBETA) 5 MG tablet Take 1 tablet (5 mg total) by mouth daily. 11/27/22   Laurey Morale, MD  chlorpheniramine (CHLOR-TRIMETON) 4 MG tablet Take 4 mg by mouth daily as needed for allergies.    [provider]  Cholecalciferol (DIALYVITE VITAMIN D 5000 PO) Take 5,000 Units by mouth daily.    [provider]  empagliflozin (JARDIANCE) 10 MG TABS tablet Take 1 tablet (10 mg total) by mouth daily before breakfast. 03/22/23   Laurey Morale, MD  famotidine (PEPCID) 20 MG tablet Take 1 tablet (20 mg total) by mouth at bedtime. One after supper 10/10/21   Vassie Loll, MD  Fluticasone-Umeclidin-Vilant (TRELEGY ELLIPTA)  200-62.5-25 MCG/ACT AEPB Inhale 1 puff into the lungs daily. 03/09/23   [name], MD  furosemide (LASIX) 40 MG tablet Take 1 tablet (40 mg total) by mouth daily. 03/29/23 06/27/23  Laurey Morale, MD  hydrALAZINE (APRESOLINE) 25 MG tablet Take 1 tablet (25 mg total) by mouth 3 (three) times daily. 03/12/23 03/06/24  Lee, [place], NP  HYDROcodone-acetaminophen (NORCO/VICODIN) 5-325 MG tablet Take 1 tablet by mouth every 6 (six) hours as needed for moderate pain (pain score 4-6). 02/17/23   Fanny Dance, MD  isosorbide mononitrate (IMDUR) 30 MG 24 hr tablet Take 0.5 tablets (15 mg total) by mouth daily. 10/18/22 03/11/24  Alen Bleacher, NP  ketoconazole (NIZORAL) 2 % cream Apply 1 Application topically daily. 01/15/23   Anabel Halon, MD  metolazone (ZAROXOLYN) 2.5 MG tablet Take one tablet of metolazone by mouth today before your Lasix dose, then stop. 03/29/23   Laurey Morale, MD  Multiple Vitamins-Minerals (MULTIVITAMIN WITH MINERALS) tablet Take 1 tablet by mouth daily.    [provider]  nitrofurantoin, macrocrystal-monohydrate, (MACROBID) 100 MG  capsule Take 100 mg by mouth every 12 (twelve) hours. 05/02/23   [provider]  Propylene Glycol (SYSTANE COMPLETE) 0.6 % SOLN Place 1 drop into both eyes 2 (two) times daily.    [provider]  venlafaxine XR (EFFEXOR-XR) 75 MG 24 hr capsule Take 1 capsule (75 mg total) by mouth See admin instructions. Patient taking differently: Take 75 mg by mouth See admin instructions. Daily 12/28/22   Fanny Dance, MD  zolpidem (AMBIEN) 5 MG tablet TAKE ONE TABLET BY MOUTH AT BEDTIME FOR SLEEP 04/17/23   [name], MD    Past Medical History: Past Medical History:  Diagnosis Date   Anemia    Anxiety    Arthritis    COPD (chronic obstructive pulmonary disease) (HCC)    CVA (cerebral vascular accident) (HCC)    Depression    Diabetes mellitus without complication (HCC)    Dry eyes 04/14/2014   GERD  (gastroesophageal reflux disease)    Glaucoma    Gout    HTN (hypertension)    OSA (obstructive sleep apnea)     Past Surgical History: Past Surgical History:  Procedure Laterality Date   ABDOMINAL HYSTERECTOMY     BACK SURGERY     lumbar-disc   BIV PACEMAKER INSERTION CRT-P N/A 07/12/2022   Procedure: BIV PACEMAKER INSERTION CRT-P;  Surgeon: Duke Salvia, MD;  [facility]: [facility] INVASIVE CV LAB;  Service: Cardiovascular;  Laterality: N/A;   BUNIONECTOMY Bilateral    FOOT SURGERY Left    repair of "kissing Cousins"   JOINT REPLACEMENT Bilateral    hip    JOINT REPLACEMENT Right 2010 or 2012   knee   KNEE SURGERY     RIGHT/LEFT HEART CATH AND CORONARY ANGIOGRAPHY N/A 10/27/2021   Procedure: RIGHT/LEFT HEART CATH AND CORONARY ANGIOGRAPHY;  Surgeon: Corky Crafts, MD;  Location: [facility] INVASIVE CV LAB;  Service: Cardiovascular;  Laterality: N/A;   SHOULDER ARTHROSCOPY Right    shoulder surgery in [place] about 2000   SHOULDER HEMI-ARTHROPLASTY Left 03/25/2014   Procedure: LEFT SHOULDER HEMI-ARTHROPLASTY;  Surgeon: Vickki Hearing, MD;  Location: [facility] ORS;  Service: Orthopedics;  Laterality: Left;    Family History: Family History  Problem Relation Age of Onset   High blood pressure Mother    Aneurysm Father    Diabetes Brother    Pancreatitis Brother    Hypertension Niece    Congestive Heart Failure Niece     Social History: Social History   Socioeconomic History   Marital status: Divorced    Spouse name: Not on file   Number of children: Not on file   Years of education: Not on file   Highest education level: 9th grade  Occupational History   Not on file  Tobacco Use   Smoking status: Former    Current packs/day: 0.00    Average packs/day: 1 pack/day for 40.0 years (40.0 ttl pk-yrs)    Types: Cigarettes    Start date: 03/20/1945    Quit date: 03/20/1985    Years since quitting: 38.1   Smokeless tobacco: Never  Vaping Use   Vaping status: Never Used  Substance  and Sexual Activity   Alcohol use: No   Drug use: No   Sexual activity: Yes    Birth control/protection: Surgical  Other Topics Concern   Not on file  Social History Narrative   Mrs Bertucci is a 83 year old divorced female who lives alone. She has support of her son and sister.  Social Drivers of Health   Financial Resource Strain: Medium Risk (11/27/2022)   Overall Financial Resource Strain (CARDIA)    Difficulty of Paying Living Expenses: Somewhat hard  Food Insecurity: Patient Declined (05/09/2023)   Hunger Vital Sign    Worried About Running Out of Food in the Last Year: Patient declined    Ran Out of Food in the Last Year: Patient declined  Transportation Needs: Patient Unable To Answer (05/09/2023)   PRAPARE - Transportation    Lack of Transportation (Medical): Patient unable to answer    Lack of Transportation (Non-Medical): Patient unable to answer  Physical Activity: Insufficiently Active (11/27/2022)   Exercise Vital Sign    Days of Exercise per Week: 3 days    Minutes of Exercise per Session: 40 min  Stress: Stress Concern Present (11/27/2022)   [employer] of Occupational Health - Occupational Stress Questionnaire    Feeling of Stress : To some extent  Social Connections: Patient Unable To Answer (05/09/2023)   Social Connection and Isolation Panel [NHANES]    Frequency of Communication with Friends and Family: Patient unable to answer    Frequency of Social Gatherings with Friends and Family: Patient unable to answer    Attends Religious Services: Patient unable to answer    Active Member of Clubs or Organizations: Patient unable to answer    Attends Banker Meetings: Patient unable to answer    Marital Status: Patient unable to answer    Allergies:  Allergies  Allergen Reactions   Lovastatin Swelling    Patient states that her tongue swells and she gets a tingling feeling all over.   Lisinopril Swelling    Tongue swelling    Objective:     Vital Signs:   Temp:  [97.3 F (36.3 C)-99 F (37.2 C)] 97.8 F (36.6 C) (03/10 0726) Pulse Rate:  [66-76] 75 (03/10 0310) Resp:  [16-24] 16 (03/10 0726) BP: (120-135)/(62-74) 126/62 (03/10 0726) SpO2:  [94 %-100 %] 99 % (03/10 0841) Weight:  [72.8 kg] 72.8 kg (03/10 0310) Last BM Date : 05/12/23  Weight change: Filed Weights   05/12/23 0500 05/13/23 0600 05/14/23 0310  Weight: 72.8 kg (P) 71.8 kg 72.8 kg    Intake/Output:   Intake/Output Summary (Last 24 hours) at 05/14/2023 0926 Last data filed at 05/14/2023 0433 Gross per 24 hour  Intake 600 ml  Output --  Net 600 ml      Physical Exam    General: fatigued appearing, no respiratory distress HEENT: normal Neck: supple. JVP 10 cm . Carotids 2+ bilat; no bruits. No lymphadenopathy or thyromegaly appreciated. Cor: PMI nondisplaced. A-V paced 70s. No rubs, gallops or murmurs. Lungs: + bibasilar rhonchi/wheezing  Abdomen: soft, nontender, +distended.  Extremities: no cyanosis, clubbing, rash, trace b/l LE edema Neuro: alert & orientedx3, cranial nerves grossly intact. moves all 4 extremities w/o difficulty. Affect pleasant   Telemetry   A-paced 70s, personally reviewed  EKG    No new EKG to review   Labs   Basic Metabolic Panel: Recent Labs  Lab 05/09/23 0428 05/10/23 0344 05/11/23 0516 05/12/23 0310 05/13/23 0458 05/14/23 0531  NA 140 139 143 145 152* 152*  K 4.0 3.3* 3.5 3.4* 3.6 4.2  CL 101 100 107 111 118* 119*  CO2 22 23 25 24 24  21*  GLUCOSE 149* 96 90 152* 130* 96  BUN 119* 119* 116* 114* 98* 93*  CREATININE 4.84* 4.87* 4.24* 3.72* 2.88* 2.50*  CALCIUM 8.0* 8.1* 8.5* 9.1 8.7* 9.0  MG 2.3 2.3 2.2 2.4 2.3 2.3  PHOS 7.5* 5.5* 4.5 4.4  --   --     Liver Function Tests: Recent Labs  Lab 05/09/23 0428 05/10/23 0344 05/11/23 0516 05/12/23 0310 05/14/23 0531  AST 4,931* 2,916* 1,410* 912* 236*  ALT 2,675* 2,618* 1,914* 1,666* 980*  ALKPHOS 163* 163* 160* 149* 131*  BILITOT 3.7* 3.9*  3.7* 4.0* 3.0*  PROT 4.6* 4.5* 4.4* 4.7* 5.3*  ALBUMIN 2.7* 2.6* 2.6* 2.8* 2.7*   No results for input(s): "LIPASE", "AMYLASE" in the last 168 hours. Recent Labs  Lab 05/10/23 0344  AMMONIA 19    CBC: Recent Labs  Lab 05/08/23 1155 05/08/23 1212 05/10/23 0344 05/11/23 0516 05/12/23 0310 05/13/23 0458 05/14/23 0531  WBC 12.4*   < > 11.6* 11.3* 11.1* 8.4 9.9  NEUTROABS 11.0*  --   --   --   --   --   --   HGB 11.4*   < > 10.6* 10.6* 10.3* 10.2* 10.6*  HCT 36.8   < > 30.1* 30.3* 29.9* 30.4* 32.4*  MCV 104.8*   < > 91.5 92.1 93.1 93.8 97.9  PLT 132*   < > 73* 53* 46* 36* 33*   < > = values in this interval not displayed.    Cardiac Enzymes: Recent Labs  Lab 05/08/23 1355  CKTOTAL 345*    BNP: BNP (last 3 results) Recent Labs    03/29/23 1059 04/09/23 1144 05/08/23 2255  BNP >4,500.0* >4,500.0* >4,500.0*    ProBNP (last 3 results) No results for input(s): "PROBNP" in the last 8760 hours.   CBG: Recent Labs  Lab 05/13/23 0921 05/13/23 1343 05/13/23 1632 05/13/23 2113 05/14/23 0907  GLUCAP 153* 125* 101* 118* 111*    Coagulation Studies: Recent Labs    05/12/23 0310  LABPROT 22.4*  INR 1.9*     Imaging   DG Swallowing Func-Speech Pathology Result Date: 05/13/2023 Table formatting from the original result was not included. Modified Barium Swallow Study Patient Details Name: TANGA GLOOR MRN: [id number] Date of Birth: 05-Feb-1941 Today's Date: 05/13/2023 HPI/PMH: HPI: 83yo female admitted 05/08/23 with weakness, AMS following police wellness visit. PMH: HFrEF, renal insuff, HTN, gout, longterm use of opiate analgesic, chronic low back pain, HLD, anxiety, COPD, GERD, AKI, DM, CKD4, sCHF. CXR = worsening airspace dz L>RLL. HeadCT = no acute finding Clinical Impression: Clinical Impression: Patient presents with a functional oropharyngeal swallow. Intermittent delay in swallow initiation noted with thin liquids, first bolus and when combined with pill, with  resultant trace flash penetration of liquids. No frank penetration or aspiration observed. Trace-mild lingual, base of tongue, and vallecular coating noted post swallow, suspect secondary to dry oral cavity and dried secretions/blood noted to coat epiglottis during FEES this am. This residue clears with subsequent swallows. Recommend dysphagia 2 solids, for energy conservation as patient with increased WOB during mastication, and thin liquids with SLP f/u for tolerance and potential to advance. Factors that may increase risk of adverse event in presence of aspiration Rubye Oaks & Clearance Coots 2021): No data recorded Recommendations/Plan: Swallowing Evaluation Recommendations Swallowing Evaluation Recommendations Recommendations: PO diet PO Diet Recommendation: Dysphagia 2 (Finely chopped); Thin liquids (Level 0) Liquid Administration via: Cup; Straw Medication Administration: Whole meds with liquid Supervision: Staff to assist with self-feeding Swallowing strategies  : Slow rate; Small bites/sips Postural changes: Position pt fully upright for meals Oral care recommendations: Oral care BID (2x/day) Treatment Plan Treatment Plan Treatment recommendations: Therapy as outlined in treatment plan below Follow-up recommendations:  No SLP follow up Functional status assessment: Patient has had a recent decline in their functional status and demonstrates the ability to make significant improvements in function in a reasonable and predictable amount of time. Treatment frequency: Min 1x/week Treatment duration: 1 week Interventions: Aspiration precaution training; Patient/family education; Trials of upgraded texture/liquids; Diet toleration management by SLP Recommendations Recommendations for follow up therapy are one component of a multi-disciplinary discharge planning process, led by the attending physician.  Recommendations may be updated based on patient status, additional functional criteria and insurance authorization.  Assessment: Orofacial Exam: Orofacial Exam Oral Cavity: Oral Hygiene: -- (green lingual coating from FEES) Oral Cavity - Dentition: Dentures, top; Missing dentition; Other (Comment) Orofacial Anatomy: WFL Oral Motor/Sensory Function: WFL Anatomy: Anatomy: -- (suspected dried secretions on epiglottis) Boluses Administered: Boluses Administered Boluses Administered: Thin liquids (Level 0); Mildly thick liquids (Level 2, nectar thick); Moderately thick liquids (Level 3, honey thick); Puree; Solid  Oral Impairment Domain: Oral Impairment Domain Lip Closure: No labial escape Tongue control during bolus hold: Cohesive bolus between tongue to palatal seal Bolus preparation/mastication: Slow prolonged chewing/mashing with complete recollection Bolus transport/lingual motion: Delayed initiation of tongue motion (oral holding) Oral residue: Trace residue lining oral structures Location of oral residue : Tongue; Palate Initiation of pharyngeal swallow : Pyriform sinuses  Pharyngeal Impairment Domain: Pharyngeal Impairment Domain Soft palate elevation: No bolus between soft palate (SP)/pharyngeal wall (PW) Laryngeal elevation: Complete superior movement of thyroid cartilage with complete approximation of arytenoids to epiglottic petiole Anterior hyoid excursion: Complete anterior movement Epiglottic movement: Complete inversion Laryngeal vestibule closure: Incomplete, narrow column air/contrast in laryngeal vestibule Pharyngeal stripping wave : Present - complete Pharyngeal contraction (A/P view only): N/A Pharyngoesophageal segment opening: Complete distension and complete duration, no obstruction of flow Tongue base retraction: Trace column of contrast or air between tongue base and PPW Pharyngeal residue: Trace residue within or on pharyngeal structures Location of pharyngeal residue: Valleculae; Tongue base  Esophageal Impairment Domain: Esophageal Impairment Domain Esophageal clearance upright position: Complete clearance,  esophageal coating Pill: Pill Consistency administered: Thin liquids (Level 0) Thin liquids (Level 0): [facility] Penetration/Aspiration Scale Score: Penetration/Aspiration Scale Score 1.  Material does not enter airway: Thin liquids (Level 0); Mildly thick liquids (Level 2, nectar thick); Moderately thick liquids (Level 3, honey thick); Puree; Solid; Pill 2.  Material enters airway, remains ABOVE vocal cords then ejected out: Thin liquids (Level 0) Compensatory Strategies: No data recorded  General Information: Caregiver present: No  Diet Prior to this Study: NPO   Temperature : Normal   Respiratory Status: Increased WOB (with activity)   Supplemental O2: Nasal cannula   History of Recent Intubation: No  Behavior/Cognition: Alert; Cooperative; Pleasant mood; Confused Self-Feeding Abilities: Able to self-feed Baseline vocal quality/speech: Normal Volitional Cough: Able to elicit Volitional Swallow: Able to elicit No data recorded Goal Planning: Prognosis for improved oropharyngeal function: Good No data recorded No data recorded No data recorded Consulted and agree with results and recommendations: Patient; Nurse Pain: Pain Assessment Pain Assessment: No/denies pain Pain Score: 0 Facial Expression: 0 Body Movements: 0 Muscle Tension: 0 Compliance with ventilator (intubated pts.): N/A Vocalization (extubated pts.): 0 CPOT Total: 0 End of Session: Start Time:SLP Start Time (ACUTE ONLY): 1422 Stop Time: SLP Stop Time (ACUTE ONLY): 1435 Time Calculation:SLP Time Calculation (min) (ACUTE ONLY): 13 min Charges: SLP Evaluations $ SLP Speech Visit: 1 Visit SLP Evaluations $MBS Swallow: 1 Procedure $FEES Swallow: 1 Procedure $Swallowing Treatment: 1 Procedure SLP visit diagnosis: SLP Visit Diagnosis: Dysphagia, oropharyngeal  phase (R13.12) Past Medical History: Past Medical History: Diagnosis Date  Anemia   Anxiety   Arthritis   COPD (chronic obstructive pulmonary disease) (HCC)   CVA (cerebral vascular accident) (HCC)   Depression    Diabetes mellitus without complication (HCC)   Dry eyes 04/14/2014  GERD (gastroesophageal reflux disease)   Glaucoma   Gout   HTN (hypertension)   OSA (obstructive sleep apnea)  Past Surgical History: Past Surgical History: Procedure Laterality Date  ABDOMINAL HYSTERECTOMY    BACK SURGERY    lumbar-disc  BIV PACEMAKER INSERTION CRT-P N/A 07/12/2022  Procedure: BIV PACEMAKER INSERTION CRT-P;  Surgeon: Duke Salvia, MD;  Location: [facility] INVASIVE CV LAB;  Service: Cardiovascular;  Laterality: N/A;  BUNIONECTOMY Bilateral   FOOT SURGERY Left   repair of "kissing Cousins"  JOINT REPLACEMENT Bilateral   hip   JOINT REPLACEMENT Right 2010 or 2012  knee  KNEE SURGERY    RIGHT/LEFT HEART CATH AND CORONARY ANGIOGRAPHY N/A 10/27/2021  Procedure: RIGHT/LEFT HEART CATH AND CORONARY ANGIOGRAPHY;  Surgeon: Corky Crafts, MD;  Location: [facility] INVASIVE CV LAB;  Service: Cardiovascular;  Laterality: N/A;  SHOULDER ARTHROSCOPY Right   shoulder surgery in [place] about 2000  SHOULDER HEMI-ARTHROPLASTY Left 03/25/2014  Procedure: LEFT SHOULDER HEMI-ARTHROPLASTY;  Surgeon: Vickki Hearing, MD;  Location: [facility];  Service: Orthopedics;  Laterality: Left; Ferdinand Lango MA, CCC-SLP McCoy Leah Meryl 05/13/2023, 2:58 PM    Medications:     Current Medications:  arformoterol  15 mcg Nebulization BID   budesonide (PULMICORT) nebulizer solution  0.5 mg Nebulization BID   famotidine  10 mg Oral Daily   feeding supplement (PROSource TF20)  60 mL Per Tube Daily   free water  200 mL Per Tube Q4H   melatonin  3 mg Oral QHS   mouth rinse  15 mL Mouth Rinse 4 times per day   polyethylene glycol  17 g Oral Daily   revefenacin  175 mcg Nebulization Daily   thiamine  100 mg Oral Daily   venlafaxine  37.5 mg Oral BID WC    Infusions:  cefTRIAXone (ROCEPHIN)  IV 2 g (05/13/23 1357)   doxycycline (VIBRAMYCIN) IV 100 mg (05/13/23 2157)   feeding supplement (OSMOLITE 1.5 CAL) 45 mL/hr at 05/13/23 0400      Patient Profile   83  y/o female w/ 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 Septic shock 2/2 PNA.   Assessment/Plan   1. Acute on Chronic Systolic Heart Failure Nonischemic cardiomyopathy. Cath in 8/23 with no significant CAD, CI 2.24. ?LBBB cardiomyopathy versus familial versus prior myocarditis. Echo in 12/23 with EF < 20%, moderately reduced RV function, D-shaped septum, mild-moderate MR. She was admitted 1/24 with volume overload, empirically started on milrinone 0.25 and Lasix gtt.  Cardiac MRI showed LV severely dilated with EF 16% and septal-lateral dyssynchrony, RV moderately dilated with EF 23%, mid-apical inferolateral LGE in prior infarction pattern. However, CAD does not explain the extent of her cardiomyopathy based on cath. Echo in 5/23 showed EF <20% with moderate LV enlargement, septal-lateral dyssynchrony, moderate RV dysfunction, PASP 65 mmHg. Underwent CRT-P 5/24.  Echo 9/24 showed that EF is still 20-25%. She was noted to only be BiV pacing 87% of the time, frequent PVCs noted on ECG. Concern that PVCs were decreasing her BiV % and worsen LV function. - now admitted w/ mixed septic and cardiogenic shock. Overall improved and off NE and  Milrinone support but concern for volume overload. C/w dyspnea/ wheezing on exam.  - give 60 mg IV Lasix x 1 and follow response - obtain 2V CXR  - GDMT limited by elevated SCr/AKI  - check device interrogation to reassess BiV pacing %   2. Acute Hypoxic Respiratory Failure - overall improved but c/w wheezing on exam  - doxy + ceftriaxone through today, 3/10 for PNA - inhalers for COPD per IM - diuretics for CHF per above   3. AKI on CKD IIIb - baseline SCr ~2.2 - SCr peaked to 4.87 this admit, in setting of shock - improving, SCr continues to trend down, 2.50 today  - follow BMP w/ lasix today   4. Shock Liver - improving - AST 2,916>>236 today  - ALT 2,618>>980 today  -  continue to follow  5. Thrombocytopenia - Plts Plts down to 33K today, 132 on admit. Not on heparin. Hgb 10.6, no gross bleeding. - suspect 2/2 critical illness, monitor closely   6. Hypernatremia - Na 152 - FW boluses  - monitor    Length of Stay: [address] PA-C  05/14/2023, 9:26 AM  Advanced Heart Failure Team Pager [phone] (M-F; 7a - 5p)  Please contact [facility] for night-coverage after hours (4p -7a ) and weekends on [email or web address]

## 2023-05-14 NOTE — Progress Notes (Addendum)
 Speech Language Pathology Treatment: Dysphagia  Patient Details Name: Heather [name] MRN: [id number] DOB: 01-Dec-1940 Today's Date: 05/14/2023 Time: 4098-1191 SLP Time Calculation (min) (ACUTE ONLY): 10 min  Assessment / Plan / Recommendation Clinical Impression  Heather [name] was tired today but participatory. She demonstrates ongoing prolonged mastication, complaints of fatigue and preference to remain on a chopped/dysphagia 2 diet for now. She is tolerating thin liquids without difficulty.  She is attentive to eating/drinking and needed only an initial prompt for safety surrounding PO intake. Recommend continue current diet - she is protecting her airway.  SLP will f/u later this week. Pt agrees   HPI HPI: 83yo female admitted 05/08/23 with weakness, AMS following police wellness visit. PMH: HFrEF, renal insuff, HTN, gout, longterm use of opiate analgesic, chronic low back pain, HLD, anxiety, COPD, GERD, AKI, DM, CKD4, sCHF. CXR = worsening airspace dz L>RLL. HeadCT = no acute finding      SLP Plan  Continue with current plan of care      Recommendations for follow up therapy are one component of a multi-disciplinary discharge planning process, led by the attending physician.  Recommendations may be updated based on patient status, additional functional criteria and insurance authorization.    Recommendations  Diet recommendations: Dysphagia 2 (fine chop);Thin liquid Liquids provided via: Cup;Straw Medication Administration: Whole meds with puree Supervision: Staff to assist with self feeding;Patient able to self feed Compensations: Minimize environmental distractions Postural Changes and/or Swallow Maneuvers: Seated upright 90 degrees                  Oral care BID     Dysphagia, oropharyngeal phase (R13.12)     Continue with current plan of care   Heather Medders L. Samson Frederic, MA CCC/SLP Clinical Specialist - Acute Care SLP Acute Rehabilitation Services Office number  [phone]   Heather [name]  05/14/2023, 11:30 AM

## 2023-05-15 DIAGNOSIS — N179 Acute kidney failure, unspecified: Secondary | ICD-10-CM | POA: Diagnosis not present

## 2023-05-15 DIAGNOSIS — N1832 Chronic kidney disease, stage 3b: Secondary | ICD-10-CM | POA: Diagnosis not present

## 2023-05-15 DIAGNOSIS — A419 Sepsis, unspecified organism: Secondary | ICD-10-CM | POA: Diagnosis not present

## 2023-05-15 DIAGNOSIS — J9601 Acute respiratory failure with hypoxia: Secondary | ICD-10-CM | POA: Diagnosis not present

## 2023-05-15 DIAGNOSIS — I5023 Acute on chronic systolic (congestive) heart failure: Secondary | ICD-10-CM | POA: Diagnosis not present

## 2023-05-15 LAB — CBC
HCT: 32.2 % — ABNORMAL LOW (ref 36.0–46.0)
Hemoglobin: 10.3 g/dL — ABNORMAL LOW (ref 12.0–15.0)
MCH: 31.8 pg (ref 26.0–34.0)
MCHC: 32 g/dL (ref 30.0–36.0)
MCV: 99.4 fL (ref 80.0–100.0)
Platelets: 51 10*3/uL — ABNORMAL LOW (ref 150–400)
RBC: 3.24 MIL/uL — ABNORMAL LOW (ref 3.87–5.11)
RDW: 18.2 % — ABNORMAL HIGH (ref 11.5–15.5)
WBC: 9.3 10*3/uL (ref 4.0–10.5)
nRBC: 0.4 % — ABNORMAL HIGH (ref 0.0–0.2)

## 2023-05-15 LAB — BASIC METABOLIC PANEL
Anion gap: 9 (ref 5–15)
BUN: 96 mg/dL — ABNORMAL HIGH (ref 8–23)
CO2: 21 mmol/L — ABNORMAL LOW (ref 22–32)
Calcium: 8.3 mg/dL — ABNORMAL LOW (ref 8.9–10.3)
Chloride: 116 mmol/L — ABNORMAL HIGH (ref 98–111)
Creatinine, Ser: 2.51 mg/dL — ABNORMAL HIGH (ref 0.44–1.00)
GFR, Estimated: 19 mL/min — ABNORMAL LOW (ref >60)
Glucose, Bld: 149 mg/dL — ABNORMAL HIGH (ref 70–99)
Potassium: 4.2 mmol/L (ref 3.5–5.1)
Sodium: 146 mmol/L — ABNORMAL HIGH (ref 135–145)

## 2023-05-15 LAB — GLUCOSE, CAPILLARY
Glucose-Capillary: 135 mg/dL — ABNORMAL HIGH (ref 70–99)
Glucose-Capillary: 157 mg/dL — ABNORMAL HIGH (ref 70–99)
Glucose-Capillary: 94 mg/dL (ref 70–99)
Glucose-Capillary: 143 mg/dL — ABNORMAL HIGH (ref 70–99)
Glucose-Capillary: 113 mg/dL — ABNORMAL HIGH (ref 70–99)

## 2023-05-15 LAB — BRAIN NATRIURETIC PEPTIDE: B Natriuretic Peptide: >4500 pg/mL — ABNORMAL HIGH (ref 0.0–100.0)

## 2023-05-15 MED ORDER — FUROSEMIDE 10 MG/ML IJ SOLN
80.0000 mg | Freq: Two times a day (BID) | INTRAMUSCULAR | Status: AC
  Administered 2023-05-15 (×2): 80 mg via INTRAVENOUS
  Filled 2023-05-15 (×2): qty 8

## 2023-05-15 MED ORDER — IPRATROPIUM-ALBUTEROL 0.5-2.5 (3) MG/3ML IN SOLN
3.0000 mL | RESPIRATORY_TRACT | Status: DC | PRN
  Administered 2023-05-15 – 2023-05-18 (×3): 3 mL via RESPIRATORY_TRACT
  Filled 2023-05-15 (×2): qty 3

## 2023-05-15 MED ORDER — FUROSEMIDE 10 MG/ML IJ SOLN: 60.0000 mg | Freq: Once | INTRAMUSCULAR | Status: DC

## 2023-05-15 NOTE — Progress Notes (Signed)
 Occupational Therapy Treatment Patient Details Name: Heather [name] MRN: [id number] DOB: Feb 12, 1941 Today's Date: 05/15/2023   History of present illness 83 y/o woman admitted from [facility] 3/4 with confusion, hypoglycemia, CAP and shock after wellfare check. PMhx: HFrEF, HTN, COPD, DM   OT comments  Pt making progress with functional goals. Pt agreeable to sit EOB requiring mod A, participated I UB ADLs, grooming tasks, sit - stand/SPT to [facility] for toileting tasks. Pt declined sitting up in chair at end of session. Pt pleasant, cooperative, however fixated on "my stomach hurts with gas and feeling terrible". OT will continue to follow acutely to maximize level of function and safety      If plan is discharge home, recommend the following:  A lot of help with bathing/dressing/bathroom;Assistance with cooking/housework;Assistance with feeding;Help with stairs or ramp for entrance;Assist for transportation;Other (comment);Supervision due to cognitive status;A little help with walking and/or transfers   Equipment Recommendations  Wheelchair cushion (measurements OT);Wheelchair (measurements OT)    Recommendations for Other Services      Precautions / Restrictions Precautions Precautions: Fall Restrictions Weight Bearing Restrictions Per Provider Order: No       Mobility Bed Mobility Overal bed mobility: Needs Assistance Bed Mobility: Supine to Sit, Sit to Supine     Supine to sit: Mod assist Sit to supine: Max assist   General bed mobility comments: mod A for management of LE off bed and bringing trunk to upright, max A with LEs back onto bed    Transfers Overall transfer level: Needs assistance Equipment used: 1 person hand held assist Transfers: Sit to/from Stand Sit to Stand: Min assist Stand pivot transfers: Min assist         General transfer comment: pt SPT to [facility], stood at [facility] for hygiene, side stepped along EOB toward HOB     Balance Overall balance assessment: Needs  assistance Sitting-balance support: No upper extremity supported, Feet supported Sitting balance-Leahy Scale: Fair     Standing balance support: Single extremity supported, During functional activity Standing balance-Leahy Scale: Poor                             ADL either performed or assessed with clinical judgement   ADL Overall ADL's : Needs assistance/impaired     Grooming: Wash/dry face;Wash/dry hands;Contact guard assist;Sitting     Upper Body Bathing Details (indicate cue type and reason): simulated seated EOB Lower Body Bathing: Moderate assistance Lower Body Bathing Details (indicate cue type and reason): simulated seated EOB Upper Body Dressing : Contact guard assist;Sitting       Toilet Transfer: Minimal assistance;Stand-pivot;BSC/3in1   Toileting- Clothing Manipulation and Hygiene: Sit to/from stand;Moderate assistance Toileting - Clothing Manipulation Details (indicate cue type and reason): mod A clothing mgt standing at [facility]     Functional mobility during ADLs: Minimal assistance;Cueing for safety      Extremity/Trunk Assessment Upper Extremity Assessment Upper Extremity Assessment: Generalized weakness   Lower Extremity Assessment Lower Extremity Assessment: Defer to PT evaluation   Cervical / Trunk Assessment Cervical / Trunk Assessment: Normal    Vision Baseline Vision/History: 1 Wears glasses Ability to See in Adequate Light: 0 Adequate Patient Visual Report: No change from baseline     Perception     Praxis     Communication Communication Communication: No apparent difficulties   Cognition Arousal: Alert Behavior During Therapy: WFL for tasks assessed/performed Cognition: Cognition impaired  Executive functioning impairment (select all impairments): Organization, Sequencing, Reasoning, Problem solving                   Following commands: Impaired Following commands impaired: Follows one step commands  inconsistently, Follows one step commands with increased time      Cueing   Cueing Techniques: Verbal cues, Gestural cues, Tactile cues, Visual cues  Exercises      Shoulder Instructions       General Comments      Pertinent Vitals/ Pain       Pain Assessment Pain Assessment: Faces Faces Pain Scale: Hurts little more Pain Location: B LE with movement, ABD Pain Descriptors / Indicators: Grimacing, Guarding Pain Intervention(s): Monitored during session, Limited activity within patient's tolerance, Repositioned  Home Living                                          Prior Functioning/Environment              Frequency  Min 2X/week        Progress Toward Goals  OT Goals(current goals can now be found in the care plan section)  Progress towards OT goals: Progressing toward goals  ADL Goals Pt Will Perform Grooming: with supervision;with set-up;sitting Pt Will Perform Upper Body Bathing: with supervision;sitting Pt Will Perform Lower Body Bathing: with min assist;sitting/lateral leans Pt Will Perform Upper Body Dressing: with supervision;sitting Pt Will Transfer to Toilet: with contact guard assist;stand pivot transfer;bedside commode Pt Will Perform Toileting - Clothing Manipulation and hygiene: with min assist;with contact guard assist;sit to/from stand  Plan      Co-evaluation                 AM-PAC OT "6 Clicks" Daily Activity     Outcome Measure   Help from another person eating meals?: Total Help from another person taking care of personal grooming?: A Little Help from another person toileting, which includes using toliet, bedpan, or urinal?: A Lot Help from another person bathing (including washing, rinsing, drying)?: A Lot Help from another person to put on and taking off regular upper body clothing?: A Little Help from another person to put on and taking off regular lower body clothing?: A Lot 6 Click Score: 13    End of  Session Equipment Utilized During Treatment: Gait belt;Other (comment) (BSC)  OT Visit Diagnosis: Unsteadiness on feet (R26.81);Other abnormalities of gait and mobility (R26.89);Muscle weakness (generalized) (M62.81);Other symptoms and signs involving cognitive function   Activity Tolerance Patient limited by fatigue   Patient Left in bed;with call bell/phone within reach;with bed alarm set;with nursing/sitter in room;Other (comment)   Nurse Communication          Time: 1610-9604 OT Time Calculation (min): 23 min  Charges: OT General Charges $OT Visit: 1 Visit OT Treatments $Self Care/Home Management : 8-22 mins $Therapeutic Activity: 8-22 mins    Galen Manila 05/15/2023, 12:13 PM

## 2023-05-15 NOTE — TOC Progression Note (Signed)
 Transition of Care [facility]) - Progression Note    Patient Details  Name: Heather [name] MRN: [id number] Date of Birth: 12-01-1940  Transition of [facility]) CM/SW Contact  Reva Bores, LCSWA Phone Number: 05/15/2023, 12:44 PM  Clinical Narrative: 12:42 PM- HF CSW called and spoke with pts son over the phone. CSW introduced herself and explained role. CSW also explained next steps in the SNF process. Pts son stated that they agreed on [facility]. CSW explained once pts cortrak is taken out and closer to being medically ready for dc we will start SNF process. Pts son agrees.   TOC will continue following.       Expected Discharge Plan: Skilled Nursing Facility Barriers to Discharge: Continued Medical Work up, SNF Pending bed offer  Expected Discharge Plan and Services In-house Referral: Clinical Social Work   Post Acute Care Choice: Skilled Nursing Facility Living arrangements for the past 2 months: Apartment                                       Social Determinants of Health (SDOH) Interventions SDOH Screenings   Food Insecurity: Patient Declined (05/09/2023)  Housing: Patient Declined (05/09/2023)  Transportation Needs: Patient Unable To Answer (05/09/2023)  Utilities: Patient Unable To Answer (05/09/2023)  Alcohol Screen: Low Risk  (11/27/2022)  Depression (PHQ2-9): Low Risk  (05/02/2023)  Financial Resource Strain: Medium Risk (11/27/2022)  Physical Activity: Insufficiently Active (11/27/2022)  Social Connections: Patient Unable To Answer (05/09/2023)  Stress: Stress Concern Present (11/27/2022)  Tobacco Use: Medium Risk (05/08/2023)  Health Literacy: Adequate Health Literacy (11/09/2022)    Readmission Risk Interventions     No data to display

## 2023-05-15 NOTE — Plan of Care (Signed)
  Problem: Clinical Measurements: Goal: Respiratory complications will improve Outcome: Progressing Goal: Cardiovascular complication will be avoided Outcome: Progressing   Problem: Activity: Goal: Risk for activity intolerance will decrease Outcome: Progressing   Problem: Safety: Goal: Ability to remain free from injury will improve Outcome: Progressing   Problem: Metabolic: Goal: Ability to maintain appropriate glucose levels will improve Outcome: Progressing

## 2023-05-15 NOTE — Progress Notes (Signed)
   05/15/23 1624  Spiritual Encounters  Type of Visit Initial  Care provided to: Patient  Conversation partners present during encounter Nurse  Reason for visit Routine spiritual support   Reason for Visit: Chaplain responding to Spiritual Consult stating Pt requests prayer  Time of Visit: 20 Minutes  Description of Visit: Chaplain arrived in room to find Pt sitting up in bed and welcoming me in to talk.  Upon asking Pt how he was holding up, Pt stated, "Well, I guess I'm alright". Pt went on to share her story of this current health crisis.   Pt shared of her faith eagerly and often in our time together.  She appears to be drawing a good deal of support and hope from her faith and her Bible.  Pt also spoke of 2 older sisters who provide physical and emotional support for her as well.   Pt spoke of a concern for whether she should attempt to go home and live alone of not.  She suspects it might not be manageable for her.  Again, she references her faith regarding this decision, stating, "I just want His will.  He's taken care of me this long." Pt spoke of "needing rest" as this current health crisis has been exhausting on her.  Pt is bright and cheerful in her demeanor, not expressing any complaints nor any ingratitude.  She speaks well of her medical team and expresses that she has confidence in their care for her.   Plan of Care: Pt seems well supported by her community and her faith.  No further spiritual care services are planned at this time unless the Pt reaches out and asks for [place] to return.  Chaplain services remain available by Spiritual Consult or for emergent cases, paging [phone]  Chaplain Raelene Bott, MDiv [email or web address] [phone]

## 2023-05-15 NOTE — Progress Notes (Signed)
 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 -Vasopressors subsequently weaned off, core track placed 3/7 for nutrition 3/10: Transfer to floor under TRH service  Significant imaging/procedure: TH : No acute CT finding. Age related volume loss. Mild chronic small-vessel ischemic change of the white matter CT CAP: Dependent atelectasis/consolidation in both lower lobes. 7 mm right middle lobe nodule. Right Spigelian hernia containing only fat.  CXR: Cardiomegaly. Worsening airspace disease at the left > right lung base which may be due to atelectasis or pneumonia. Possible small left effusion. ECHO: G HKN worse apex and septum with akinesis. LVEF 25% with regional wall motion abnormalities. Severely enlarged LV. G I DD. RV function normal.  Consultation: PCCM-admitting   Subjective: Some shortness of breath says she is eating fair NGT in place Leg swollen Son at the bedside this morning   Assessment and Plan: Principal Problem:   Acute sepsis [facility]) Active Problems:   Cardiogenic shock (HCC)   Malnutrition of moderate degree   Shock-likely combination of sepsis and cardiogenic Severe acidosis Elevated troponin from demand ischemia: Patient managed IV antibiotics through 3/10.  Initially needed  pressors, off pressors since 3/8. Blood culture from 3/4 NGTD.  Respiratory virus panel negative.  MRSA PCR negative Monitor vitals, urine output.  Shock resolved overall stable.  Completed antibiotics Recent Labs  Lab 05/08/23 1155 05/08/23 1349 05/08/23 1512 05/08/23 1805 05/09/23 0536 05/09/23 0650 05/10/23 0344 05/11/23 0516 05/12/23 0310 05/13/23 0458 05/14/23 0531 05/15/23 0513  WBC 12.4*  --   --  12.3*   < >  --    < > 11.3* 11.1* 8.4 10.1  9.9 9.3  LATICACIDVEN >9.0* >9.0* >9.0*  --   --  3.0*  --   --   --   --   --   --   PROCALCITON  --   --   --  2.21  --   --   --   --   --   --   --   --    < > = values in this interval not displayed.    Acute on chronic systolic CHF-cardiogenic shock NICM with EF 25%-previously 20 to 25% September 2024 BiV PCM in place since 07/2022 Elevated troponin likely demand ischemia: Current echo shows EF 25% with RWMA,G HKN, enlarged LV.  More shortness of breath and edematous leg today increasing Lasix to 80 mg twice daily. Continue to monitor volume status, advanced CHF team following Continue to hold home bisoprolol, hydralazine,Imdur and amiodarone  Cont to monitor daily I/O,weight, electrolytes and net balance as below. Keep on  salt/fluid restricted diet and monitor in tele. Net IO Since Admission: 2,441.6 mL [05/15/23 1050]  Filed Weights   05/13/23 0600 05/14/23 0310 05/15/23 0333  Weight: (P) 71.8 kg 72.8 kg 74.4 kg    Recent Labs  Lab 05/08/23 2255 05/09/23 0428 05/10/23 0344 05/11/23  1610 05/12/23 0310 05/13/23 0458 05/14/23 0531 05/15/23 0513  BNP >4,500.0*  --   --   --   --   --   --  >4,500.0*  BUN  --    < > 119* 116* 114* 98* 93* 96*  CREATININE  --    < > 4.87* 4.24* 3.72* 2.88* 2.50* 2.51*  K  --    < > 3.3* 3.5 3.4* 3.6 4.2 4.2  MG  --    < > 2.3 2.2 2.4 2.3 2.3  --    < > = values in this interval not displayed.    Acute hypoxic respiratory failure CAP COPD history: Completed antibiotics 3/10.   Doing well on room air.  Continue budesonide [name] (on Trelegy at home).  AKI on CKD stage IV: Baseline creatinine 2.1-2.4.  Creatinine nicely improved to 2.5.  Monitor while on Lasix, bicarbonate at 21 stable, monitor.  Acute metabolic encephalopathy: Patient confused during welfare check and brought to the ED. She will need skilled nursing.  On discharge currently mentation is stable and improved. Continue delirium precaution.  Moderate malnutrition/dysphagia: Augment diet. Speech following-DYS 2 diet started 3/9.On NGT feeding -wean slowly-starting calorie count today  HLD: Holding statin due to transaminitis.  Transaminitis Elevated INR/coagulopathy: Likely shock liver.  Monitor LFTs-appears to be downtrending.  Chronic anemia: Likely from chronic kidney disease, hb stable at 10 mg. Recent Labs  Lab 05/11/23 0516 05/12/23 0310 05/13/23 0458 05/14/23 0531 05/15/23 0513  HGB 10.6* 10.3* 10.2* 10.8*  10.6* 10.3*  HCT 30.3* 29.9* 30.4* 32.9*  32.4* 32.2*    Acute thrombocytopenia: Platelet on admit 132k now at 33K -input appreciated from hematology.  Platelet improving.  Monitor  Recent Labs  Lab 05/11/23 0516 05/12/23 0310 05/13/23 0458 05/14/23 0531 05/15/23 0513  PLT 53* 46* 36* 34*  33* 51*    Hypernatremia: Likely from patient's after diuresis.  Monitor labs closely.  Free water intake. Recent Labs  Lab 05/11/23 0516 05/12/23 0310 05/13/23 0458 05/14/23 0531 05/15/23 0513  NA 143 145 152* 152* 146*    Type 2 diabetes mellitus with hypoglycemia: CBG 40 for EMS. PTA on Jardiance and currently on hold.  7 mm RML nodule: Needs follow-up in 6 to 12 months and pulmonary or pcp to arrange.  Blood sugar controlled  Chronic back pain Lumbar radiculitis Anxiety: Effexor to resume once able to swallow.  Holding narcotics due to confusion.  DVT prophylaxis: SCDs Start: 05/08/23 1806 Code Status:   Code Status: Full Code Family Communication:  plan of care discussed with patient/none at bedside. Patient status is: Remains hospitalized because of severity of illness Level of care: Progressive   Dispo: The patient is from: lives alone            Anticipated disposition: SNF once volume status improves Objective: Vitals last 24 hrs: Vitals:   05/15/23 0333 05/15/23 0748 05/15/23 0820 05/15/23 0821  BP: (!) 105/55     Pulse: 72     Resp: 16 18    Temp: 98.9 F (37.2 C) 98.7 F (37.1 C)    TempSrc: Oral Oral    SpO2: 97%  97% 92%  Weight: 74.4 kg     Height:       Weight change: 1.6 kg  Physical Examination: General exam: alert awake, oriented at baseline, older than stated age HEENT:Oral mucosa moist, Ear/Nose WNL grossly Respiratory system: Bilaterally clear BS,no use of accessory muscle Cardiovascular system: S1 & S2 +, + JVD. Gastrointestinal  system: Abdomen soft,NT,ND, BS+ Nervous System: Alert, awake, moving all extremities,and following commands. Extremities: LE edema ++,distal peripheral pulses palpable and warm.  Skin: No rashes,no icterus. MSK: Normal muscle bulk,tone, power   Medications reviewed:  Scheduled Meds:  arformoterol  15 mcg Nebulization BID   budesonide (PULMICORT) nebulizer solution  0.5 mg Nebulization BID   famotidine  10 mg Oral Daily   feeding supplement  237 mL Oral BID BM   feeding supplement (PROSource TF20)  60 mL Per Tube Daily   free water  200 mL Per Tube Q4H   furosemide  80 mg Intravenous BID   melatonin  3 mg Oral QHS   mouth rinse  15 mL Mouth Rinse 4 times per day   polyethylene glycol  17 g Oral Daily   revefenacin  175 mcg Nebulization Daily   venlafaxine  37.5 mg Oral BID WC   Continuous Infusions:  feeding supplement (OSMOLITE 1.5 CAL) Stopped (05/15/23 0841)    Diet Order             DIET DYS 2 Room service appropriate? Yes; Fluid consistency: Thin  Diet effective now                  Intake/Output Summary (Last 24 hours) at 05/15/2023 1050 Last data  filed at 05/15/2023 0300 Gross per 24 hour  Intake 597 ml  Output 651 ml  Net -54 ml  Net IO Since Admission: 2,441.6 mL [05/15/23 1050]  Wt Readings from Last 3 Encounters:  05/15/23 74.4 kg  05/02/23 69.4 kg  04/09/23 66 kg   Unresulted Labs (From admission, onward)     Start     Ordered   05/16/23 0500  Magnesium  Tomorrow morning,   R       Question:  Specimen collection method  Answer:  Lab=Lab collect   05/15/23 0917   05/15/23 0500  Basic metabolic panel  Daily,   R     Question:  Specimen collection method  Answer:  Lab=Lab collect   05/14/23 0834   05/15/23 0500  CBC  Daily,   R     Question:  Specimen collection method  Answer:  Lab=Lab collect   05/14/23 0834   05/10/23 1051  Expectorated Sputum Assessment w Gram Stain, Rflx to Resp Cult  Once,   R        05/10/23 1050          Data Reviewed: I have personally reviewed following labs and imaging studies CBC: Recent Labs  Lab 05/08/23 1155 05/08/23 1212 05/11/23 0516 05/12/23 0310 05/13/23 0458 05/14/23 0531 05/15/23 0513  WBC 12.4*   < > 11.3* 11.1* 8.4 10.1  9.9 9.3  NEUTROABS 11.0*  --   --   --   --  8.2*  --   HGB 11.4*   < > 10.6* 10.3* 10.2* 10.8*  10.6* 10.3*  HCT 36.8   < > 30.3* 29.9* 30.4* 32.9*  32.4* 32.2*  MCV 104.8*   < > 92.1 93.1 93.8 98.2  97.9 99.4  PLT 132*   < > 53* 46* 36* 34*  33* 51*   < > = values in this interval not displayed.   Basic Metabolic Panel:  Recent Labs  Lab 05/09/23 0428 05/10/23 0344 05/11/23 0516 05/12/23 0310 05/13/23 0458 05/14/23 0531 05/15/23 0513  NA 140 139 143 145 152* 152* 146*  K 4.0 3.3* 3.5 3.4* 3.6 4.2 4.2  CL 101 100 107 111 118* 119* 116*  CO2 22 23 25 24 24  21* 21*  GLUCOSE 149* 96 90 152* 130* 96 149*  BUN 119* 119* 116* 114* 98* 93* 96*  CREATININE 4.84* 4.87* 4.24* 3.72* 2.88* 2.50* 2.51*  CALCIUM 8.0* 8.1* 8.5* 9.1 8.7* 9.0 8.3*  MG 2.3 2.3 2.2 2.4 2.3 2.3  --   PHOS 7.5* 5.5* 4.5 4.4  --   --   --    GFR: Estimated  Creatinine Clearance: 16.6 mL/min (A) (by C-G formula based on SCr of 2.51 mg/dL (H)). Liver Function Tests:  Recent Labs  Lab 05/09/23 0428 05/10/23 0344 05/11/23 0516 05/12/23 0310 05/14/23 0531  AST 4,931* 2,916* 1,410* 912* 236*  ALT 2,675* 2,618* 1,914* 1,666* 980*  ALKPHOS 163* 163* 160* 149* 131*  BILITOT 3.7* 3.9* 3.7* 4.0* 3.0*  PROT 4.6* 4.5* 4.4* 4.7* 5.3*  ALBUMIN 2.7* 2.6* 2.6* 2.8* 2.7*  No results for input(s): "LIPASE", "AMYLASE" in the last 168 hours.  Recent Labs  Lab 05/10/23 0344  AMMONIA 19  Coagulation Profile:  Recent Labs  Lab 05/08/23 1155 05/09/23 0428 05/10/23 0344 05/11/23 0516 05/12/23 0310  INR 3.2* 4.8* 3.9* 2.5* 1.9*  No results for input(s): "PROBNP" in the last 168 hours.  No results for input(s): "HGBA1C" in the last 72 hours. Recent Labs  Lab 05/14/23 0907 05/14/23 1631 05/14/23 2127 05/15/23 0322 05/15/23 0748  GLUCAP 111* 119* 157* 135* 157*   Recent Labs  Lab 05/08/23 1155 05/08/23 1349 05/08/23 1512 05/08/23 1805 05/09/23 0650  PROCALCITON  --   --   --  2.21  --   LATICACIDVEN >9.0* >9.0* >9.0*  --  3.0*   Recent Results (from the past 240 hours)  Blood Culture (routine x 2)     Status: None   Collection Time: 05/08/23 11:55 AM   Specimen: BLOOD  Result Value Ref Range Status   Specimen Description BLOOD RIGHT ANTECUBITAL  Final   Special Requests   Final    BOTTLES DRAWN AEROBIC AND ANAEROBIC Blood Culture adequate volume   Culture   Final    NO GROWTH 5 DAYS Performed at [facility], [address] [place], [place] [address]    Report Status 05/13/2023 FINAL  Final  Blood Culture (routine x 2)     Status: None   Collection Time: 05/08/23 12:15 PM   Specimen: BLOOD  Result Value Ref Range Status   Specimen Description BLOOD BLOOD RIGHT WRIST  Final   Special Requests   Final    BOTTLES DRAWN AEROBIC ONLY Blood Culture results may not be optimal due to an inadequate volume of blood received in culture  bottles   Culture   Final    NO GROWTH 5 DAYS Performed at [facility], [address] [place], [place] [address]    Report Status 05/13/2023 FINAL  Final  Resp panel by RT-PCR (RSV, Flu A&B, Covid) Anterior Nasal Swab     Status: None   Collection Time: 05/08/23 12:27 PM   Specimen: Anterior Nasal Swab  Result Value Ref Range Status   SARS Coronavirus 2 by RT PCR NEGATIVE NEGATIVE Final    Comment: (NOTE) SARS-CoV-2 target nucleic acids are NOT DETECTED.  The SARS-CoV-2 RNA is generally detectable in upper respiratory specimens during the acute phase of infection. The lowest concentration of SARS-CoV-2 viral copies this assay can detect is 138 copies/mL. A negative result does not preclude SARS-Cov-2 infection and should not be used as the sole basis for treatment or other patient management decisions. A  negative result may occur with  improper specimen collection/handling, submission of specimen other than nasopharyngeal swab, presence of viral mutation(s) within the areas targeted by this assay, and inadequate number of viral copies(<138 copies/mL). A negative result must be combined with clinical observations, patient history, and epidemiological information. The expected result is Negative.  Fact Sheet for Patients:  BloggerCourse.com  Fact Sheet for Healthcare Providers:  SeriousBroker.it  This test is no t yet approved or cleared by the [place] FDA and  has been authorized for detection and/or diagnosis of SARS-CoV-2 by FDA under an Emergency Use Authorization (EUA). This EUA will remain  in effect (meaning this test can be used) for the duration of the COVID-19 declaration under Section 564(b)(1) of the Act, 21 U.S.C.section 360bbb-3(b)(1), unless the authorization is terminated  or revoked sooner.       Influenza A by PCR NEGATIVE NEGATIVE Final   Influenza B by PCR NEGATIVE NEGATIVE Final    Comment:  (NOTE) The Xpert Xpress SARS-CoV-2/FLU/RSV plus assay is intended as an aid in the diagnosis of influenza from Nasopharyngeal swab specimens and should not be used as a sole basis for treatment. Nasal washings and aspirates are unacceptable for Xpert Xpress SARS-CoV-2/FLU/RSV testing.  Fact Sheet for Patients: BloggerCourse.com  Fact Sheet for Healthcare Providers: SeriousBroker.it  This test is not yet approved or cleared by the [place] FDA and has been authorized for detection and/or diagnosis of SARS-CoV-2 by FDA under an Emergency Use Authorization (EUA). This EUA will remain in effect (meaning this test can be used) for the duration of the COVID-19 declaration under Section 564(b)(1) of the Act, 21 U.S.C. section 360bbb-3(b)(1), unless the authorization is terminated or revoked.     Resp Syncytial Virus by PCR NEGATIVE NEGATIVE Final    Comment: (NOTE) Fact Sheet for Patients: BloggerCourse.com  Fact Sheet for Healthcare Providers: SeriousBroker.it  This test is not yet approved or cleared by the [place] FDA and has been authorized for detection and/or diagnosis of SARS-CoV-2 by FDA under an Emergency Use Authorization (EUA). This EUA will remain in effect (meaning this test can be used) for the duration of the COVID-19 declaration under Section 564(b)(1) of the Act, 21 U.S.C. section 360bbb-3(b)(1), unless the authorization is terminated or revoked.  Performed at [facility], [address] [place], [place] [address]   Respiratory (~20 pathogens) panel by PCR     Status: None   Collection Time: 05/08/23  6:09 PM   Specimen: Nasopharyngeal Swab; Respiratory  Result Value Ref Range Status   Adenovirus NOT DETECTED NOT DETECTED Final   Coronavirus 229E NOT DETECTED NOT DETECTED Final    Comment: (NOTE) The Coronavirus on the Respiratory Panel, DOES NOT test  for the novel  Coronavirus (2019 nCoV)    Coronavirus HKU1 NOT DETECTED NOT DETECTED Final   Coronavirus NL63 NOT DETECTED NOT DETECTED Final   Coronavirus OC43 NOT DETECTED NOT DETECTED Final   Metapneumovirus NOT DETECTED NOT DETECTED Final   Rhinovirus / Enterovirus NOT DETECTED NOT DETECTED Final   Influenza A NOT DETECTED NOT DETECTED Final   Influenza B NOT DETECTED NOT DETECTED Final   Parainfluenza Virus 1 NOT DETECTED NOT DETECTED Final   Parainfluenza Virus 2 NOT DETECTED NOT DETECTED Final   Parainfluenza Virus 3 NOT DETECTED NOT DETECTED Final   Parainfluenza Virus 4 NOT DETECTED NOT DETECTED Final   Respiratory Syncytial Virus NOT DETECTED NOT DETECTED Final   Bordetella pertussis NOT DETECTED NOT DETECTED Final   Bordetella Parapertussis  NOT DETECTED NOT DETECTED Final   Chlamydophila pneumoniae NOT DETECTED NOT DETECTED Final   Mycoplasma pneumoniae NOT DETECTED NOT DETECTED Final    Comment: Performed at [facility], [address] [place], [place] [address]  MRSA Next Gen by PCR, Nasal     Status: None   Collection Time: 05/08/23  6:13 PM   Specimen: Nasal Mucosa; Nasal Swab  Result Value Ref Range Status   MRSA by PCR Next Gen NOT DETECTED NOT DETECTED Final    Comment: (NOTE) The GeneXpert MRSA Assay (FDA approved for NASAL specimens only), is one component of a comprehensive MRSA colonization surveillance program. It is not intended to diagnose MRSA infection nor to guide or monitor treatment for MRSA infections. Test performance is not FDA approved in patients less than 29 years old. Performed at [facility], [address] [place], [place] [address]   Antimicrobials/Microbiology: Anti-infectives (From admission, onward)    Start     Dose/Rate Route Frequency Ordered Stop   05/09/23 1400  cefTRIAXone (ROCEPHIN) 2 g in sodium chloride 0.9 % 100 mL IVPB        2 g 200 mL/hr over 30 Minutes Intravenous Every 24 hours 05/09/23 1300 05/14/23  1429   05/09/23 1200  ceFEPIme (MAXIPIME) 1 g in sodium chloride 0.9 % 100 mL IVPB  Status:  Discontinued        1 g 200 mL/hr over 30 Minutes Intravenous Every 24 hours 05/08/23 1825 05/09/23 1300   05/08/23 2000  doxycycline (VIBRAMYCIN) 100 mg in sodium chloride 0.9 % 250 mL IVPB        100 mg 125 mL/hr over 120 Minutes Intravenous Every 12 hours 05/08/23 1912 05/15/23 1032   05/08/23 1820  vancomycin variable dose per unstable renal function (pharmacist dosing)  Status:  Discontinued         Does not apply See admin instructions 05/08/23 1821 05/09/23 1300   05/08/23 1300  vancomycin (VANCOREADY) IVPB 1250 mg/250 mL        1,250 mg 166.7 mL/hr over 90 Minutes Intravenous  Once 05/08/23 1258 05/08/23 1543   05/08/23 1245  ceFEPIme (MAXIPIME) 2 g in sodium chloride 0.9 % 100 mL IVPB        2 g 200 mL/hr over 30 Minutes Intravenous  Once 05/08/23 1243 05/08/23 1327   05/08/23 1245  metroNIDAZOLE (FLAGYL) IVPB 500 mg        500 mg 100 mL/hr over 60 Minutes Intravenous  Once 05/08/23 1243 05/08/23 1400   05/08/23 1245  vancomycin (VANCOCIN) IVPB 1000 mg/200 mL premix  Status:  Discontinued        1,000 mg 200 mL/hr over 60 Minutes Intravenous  Once 05/08/23 1243 05/08/23 1258         Component Value Date/Time   SDES BLOOD BLOOD RIGHT WRIST 05/08/2023 1215   SPECREQUEST  05/08/2023 1215    BOTTLES DRAWN AEROBIC ONLY Blood Culture results may not be optimal due to an inadequate volume of blood received in culture bottles   CULT  05/08/2023 1215    NO GROWTH 5 DAYS Performed at [facility], [address] [place], [place] [address]    REPTSTATUS 05/13/2023 FINAL 05/08/2023 1215   Radiology Studies: DG Chest 2 View Result Date: 05/14/2023 CLINICAL DATA:  Shortness of breath. EXAM: CHEST - 2 VIEW COMPARISON:  Radiograph and CT 05/08/2023 FINDINGS: Weighted enteric tube in place. Left-sided pacemaker in place. Cardiomegaly is stable. Increasing bilateral pleural effusions and basilar  opacities. Developing vascular congestion. No pneumothorax. IMPRESSION: 1. Increasing bilateral pleural effusions and basilar opacities, atelectasis versus pneumonia. 2. Cardiomegaly with developing vascular congestion. Electronically Signed   By: Narda Rutherford M.D.   On: 05/14/2023 15:23   DG Swallowing Func-Speech Pathology Result Date: 05/13/2023 Table formatting from the original result was not included. Modified Barium Swallow Study Patient Details Name: MAKENZY KRIST MRN: [id number] Date of Birth: January 18, 1941 Today's Date: 05/13/2023 HPI/PMH: HPI: 83yo female admitted 05/08/23 with weakness, AMS following police wellness visit. PMH: HFrEF, renal insuff, HTN, gout, longterm use of opiate analgesic, chronic low back pain, HLD, anxiety, COPD, GERD, AKI, DM, CKD4, sCHF. CXR = worsening airspace dz L>RLL. HeadCT = no acute finding Clinical Impression: Clinical Impression: Patient presents with a functional oropharyngeal swallow. Intermittent delay in swallow initiation noted with thin liquids, first bolus and when combined with pill, with resultant trace flash penetration of liquids. No frank penetration or aspiration observed. Trace-mild lingual, base of tongue, and vallecular coating noted post swallow, suspect secondary to dry oral cavity and dried secretions/blood noted to coat epiglottis during FEES this am. This residue clears with subsequent swallows. Recommend dysphagia 2 solids, for energy conservation as patient with increased WOB during mastication, and thin liquids with SLP f/u for tolerance and potential to advance. Factors that may increase risk of adverse event in presence of aspiration Rubye Oaks & Clearance Coots 2021): No data recorded Recommendations/Plan: Swallowing Evaluation Recommendations Swallowing Evaluation Recommendations Recommendations: PO diet PO Diet Recommendation: Dysphagia 2 (Finely chopped); Thin liquids (Level 0) Liquid Administration via: Cup; Straw Medication Administration: Whole meds  with liquid Supervision: Staff to assist with self-feeding Swallowing strategies  : Slow rate; Small bites/sips Postural changes: Position pt fully upright for meals Oral care recommendations: Oral care BID (2x/day) Treatment Plan Treatment Plan Treatment recommendations: Therapy as outlined in treatment plan below Follow-up recommendations: No SLP follow up Functional status assessment: Patient has had a recent decline in their functional status and demonstrates the ability to make significant improvements in function in a reasonable and predictable amount of time. Treatment frequency: Min 1x/week Treatment duration: 1 week Interventions: Aspiration precaution training; Patient/family education; Trials of upgraded texture/liquids; Diet toleration management by SLP Recommendations Recommendations for follow up therapy are one component of a multi-disciplinary discharge planning process, led by the attending physician.  Recommendations may be updated based on patient status, additional functional criteria and insurance authorization. Assessment: Orofacial Exam: Orofacial Exam Oral Cavity: Oral Hygiene: -- (green lingual coating from FEES) Oral Cavity - Dentition: Dentures, top; Missing dentition; Other (Comment) Orofacial Anatomy: WFL Oral Motor/Sensory Function: WFL Anatomy: Anatomy: -- (suspected dried secretions on epiglottis) Boluses Administered: Boluses Administered Boluses Administered: Thin liquids (Level 0); Mildly thick liquids (Level 2, nectar thick); Moderately thick liquids (Level 3, honey thick); Puree; Solid  Oral Impairment Domain: Oral Impairment Domain Lip Closure: No labial escape Tongue control during bolus hold: Cohesive bolus between tongue to palatal seal Bolus preparation/mastication: Slow prolonged chewing/mashing with complete recollection Bolus transport/lingual motion: Delayed initiation of tongue motion (oral holding) Oral residue: Trace residue lining oral structures Location of oral  residue : Tongue; Palate Initiation of pharyngeal swallow : Pyriform sinuses  Pharyngeal Impairment Domain: Pharyngeal Impairment Domain Soft palate elevation: No bolus between soft palate (SP)/pharyngeal wall (PW) Laryngeal elevation: Complete superior movement of thyroid cartilage with complete approximation of arytenoids to epiglottic petiole Anterior hyoid excursion: Complete anterior movement Epiglottic movement: Complete inversion Laryngeal vestibule closure: Incomplete, narrow column air/contrast in laryngeal vestibule Pharyngeal stripping wave : Present -  complete Pharyngeal contraction (A/P view only): N/A Pharyngoesophageal segment opening: Complete distension and complete duration, no obstruction of flow Tongue base retraction: Trace column of contrast or air between tongue base and PPW Pharyngeal residue: Trace residue within or on pharyngeal structures Location of pharyngeal residue: Valleculae; Tongue base  Esophageal Impairment Domain: Esophageal Impairment Domain Esophageal clearance upright position: Complete clearance, esophageal coating Pill: Pill Consistency administered: Thin liquids (Level 0) Thin liquids (Level 0): [facility] Penetration/Aspiration Scale Score: Penetration/Aspiration Scale Score 1.  Material does not enter airway: Thin liquids (Level 0); Mildly thick liquids (Level 2, nectar thick); Moderately thick liquids (Level 3, honey thick); Puree; Solid; Pill 2.  Material enters airway, remains ABOVE vocal cords then ejected out: Thin liquids (Level 0) Compensatory Strategies: No data recorded  General Information: Caregiver present: No  Diet Prior to this Study: NPO   Temperature : Normal   Respiratory Status: Increased WOB (with activity)   Supplemental O2: Nasal cannula   History of Recent Intubation: No  Behavior/Cognition: Alert; Cooperative; Pleasant mood; Confused Self-Feeding Abilities: Able to self-feed Baseline vocal quality/speech: Normal Volitional Cough: Able to elicit Volitional  Swallow: Able to elicit No data recorded Goal Planning: Prognosis for improved oropharyngeal function: Good No data recorded No data recorded No data recorded Consulted and agree with results and recommendations: Patient; Nurse Pain: Pain Assessment Pain Assessment: No/denies pain Pain Score: 0 Facial Expression: 0 Body Movements: 0 Muscle Tension: 0 Compliance with ventilator (intubated pts.): N/A Vocalization (extubated pts.): 0 CPOT Total: 0 End of Session: Start Time:SLP Start Time (ACUTE ONLY): 1422 Stop Time: SLP Stop Time (ACUTE ONLY): 1435 Time Calculation:SLP Time Calculation (min) (ACUTE ONLY): 13 min Charges: SLP Evaluations $ SLP Speech Visit: 1 Visit SLP Evaluations $MBS Swallow: 1 Procedure $FEES Swallow: 1 Procedure $Swallowing Treatment: 1 Procedure SLP visit diagnosis: SLP Visit Diagnosis: Dysphagia, oropharyngeal phase (R13.12) Past Medical History: Past Medical History: Diagnosis Date  Anemia   Anxiety   Arthritis   COPD (chronic obstructive pulmonary disease) (HCC)   CVA (cerebral vascular accident) (HCC)   Depression   Diabetes mellitus without complication (HCC)   Dry eyes 04/14/2014  GERD (gastroesophageal reflux disease)   Glaucoma   Gout   HTN (hypertension)   OSA (obstructive sleep apnea)  Past Surgical History: Past Surgical History: Procedure Laterality Date  ABDOMINAL HYSTERECTOMY    BACK SURGERY    lumbar-disc  BIV PACEMAKER INSERTION CRT-P N/A 07/12/2022  Procedure: BIV PACEMAKER INSERTION CRT-P;  Surgeon: Duke Salvia, MD;  Location: [facility] INVASIVE CV LAB;  Service: Cardiovascular;  Laterality: N/A;  BUNIONECTOMY Bilateral   FOOT SURGERY Left   repair of "kissing Cousins"  JOINT REPLACEMENT Bilateral   hip   JOINT REPLACEMENT Right 2010 or 2012  knee  KNEE SURGERY    RIGHT/LEFT HEART CATH AND CORONARY ANGIOGRAPHY N/A 10/27/2021  Procedure: RIGHT/LEFT HEART CATH AND CORONARY ANGIOGRAPHY;  Surgeon: Corky Crafts, MD;  Location: [facility] INVASIVE CV LAB;  Service: Cardiovascular;   Laterality: N/A;  SHOULDER ARTHROSCOPY Right   shoulder surgery in [place] about 2000  SHOULDER HEMI-ARTHROPLASTY Left 03/25/2014  Procedure: LEFT SHOULDER HEMI-ARTHROPLASTY;  Surgeon: Vickki Hearing, MD;  Location: [facility];  Service: Orthopedics;  Laterality: Left; Leah McCoy MA, CCC-SLP McCoy Leah Meryl 05/13/2023, 2:58 PM  LOS: 7 days   Total time spent in review of labs and imaging, patient evaluation, formulation of plan, documentation and communication with family: 50 minutes  Lanae Boast, MD  [facility]  05/15/2023, 10:50 AM

## 2023-05-15 NOTE — Progress Notes (Signed)
 Advanced Heart Failure Rounding Note  Cardiologist: Vishnu Norton Pastel, MD  Chief Complaint:  Subjective:   3/10 Given 60 mg IV lasix x1  SOB with movement   Objective:   Weight Range: 74.4 kg Body mass index is 30 kg/m.   Vital Signs:   Temp:  [98 F (36.7 C)-98.9 F (37.2 C)] 98.7 F (37.1 C) (03/11 0748) Pulse Rate:  [69-75] 72 (03/11 0333) Resp:  [16-18] 18 (03/11 0748) BP: (98-114)/(53-59) 105/55 (03/11 0333) SpO2:  [92 %-100 %] 92 % (03/11 0821) Weight:  [74.4 kg] 74.4 kg (03/11 0333) Last BM Date : 05/12/23  Weight change: Filed Weights   05/13/23 0600 05/14/23 0310 05/15/23 0333  Weight: (P) 71.8 kg 72.8 kg 74.4 kg    Intake/Output:   Intake/Output Summary (Last 24 hours) at 05/15/2023 0829 Last data filed at 05/15/2023 0300 Gross per 24 hour  Intake 597 ml  Output 651 ml  Net -54 ml      Physical Exam  General:   Appears frail. No resp difficulty Neck: supple. JVP -8-9  Cor: PMI nondisplaced. Regular rate & rhythm. No rubs, gallops or murmurs. Lungs: Rhonchi on rrom air.  Abdomen: soft, nontender, nondistended.  Extremities: no cyanosis, clubbing, rash, trace edema Neuro: alert & oriented x3 Flat    Telemetry   SR  EKG    N/A   Labs    CBC Recent Labs    05/14/23 0531 05/15/23 0513  WBC 10.1  9.9 9.3  NEUTROABS 8.2*  --   HGB 10.8*  10.6* 10.3*  HCT 32.9*  32.4* 32.2*  MCV 98.2  97.9 99.4  PLT 34*  33* 51*   Basic Metabolic Panel Recent Labs    95/28/41 0458 05/14/23 0531 05/15/23 0513  NA 152* 152* 146*  K 3.6 4.2 4.2  CL 118* 119* 116*  CO2 24 21* 21*  GLUCOSE 130* 96 149*  BUN 98* 93* 96*  CREATININE 2.88* 2.50* 2.51*  CALCIUM 8.7* 9.0 8.3*  MG 2.3 2.3  --    Liver Function Tests Recent Labs    05/14/23 0531  AST 236*  ALT 980*  ALKPHOS 131*  BILITOT 3.0*  PROT 5.3*  ALBUMIN 2.7*   No results for input(s): "LIPASE", "AMYLASE" in the last 72 hours. Cardiac Enzymes No results for input(s):  "CKTOTAL", "CKMB", "CKMBINDEX", "TROPONINI" in the last 72 hours.  BNP: BNP (last 3 results) Recent Labs    04/09/23 1144 05/08/23 2255 05/15/23 0513  BNP >4,500.0* >4,500.0* >4,500.0*    ProBNP (last 3 results) No results for input(s): "PROBNP" in the last 8760 hours.   D-Dimer No results for input(s): "DDIMER" in the last 72 hours. Hemoglobin A1C No results for input(s): "HGBA1C" in the last 72 hours. Fasting Lipid Panel No results for input(s): "CHOL", "HDL", "LDLCALC", "TRIG", "CHOLHDL", "LDLDIRECT" in the last 72 hours. Thyroid Function Tests No results for input(s): "TSH", "T4TOTAL", "T3FREE", "THYROIDAB" in the last 72 hours.  Invalid input(s): "FREET3"  Other results:   Imaging    DG Chest 2 View Result Date: 05/14/2023 CLINICAL DATA:  Shortness of breath. EXAM: CHEST - 2 VIEW COMPARISON:  Radiograph and CT 05/08/2023 FINDINGS: Weighted enteric tube in place. Left-sided pacemaker in place. Cardiomegaly is stable. Increasing bilateral pleural effusions and basilar opacities. Developing vascular congestion. No pneumothorax. IMPRESSION: 1. Increasing bilateral pleural effusions and basilar opacities, atelectasis versus pneumonia. 2. Cardiomegaly with developing vascular congestion. Electronically Signed   By: Narda Rutherford M.D.   On: 05/14/2023  15:23     Medications:     Scheduled Medications:  arformoterol  15 mcg Nebulization BID   budesonide (PULMICORT) nebulizer solution  0.5 mg Nebulization BID   famotidine  10 mg Oral Daily   feeding supplement  237 mL Oral BID BM   feeding supplement (PROSource TF20)  60 mL Per Tube Daily   free water  200 mL Per Tube Q4H   melatonin  3 mg Oral QHS   mouth rinse  15 mL Mouth Rinse 4 times per day   polyethylene glycol  17 g Oral Daily   revefenacin  175 mcg Nebulization Daily   thiamine  100 mg Oral Daily   venlafaxine  37.5 mg Oral BID WC    Infusions:  doxycycline (VIBRAMYCIN) IV 100 mg (05/14/23 2030)    feeding supplement (OSMOLITE 1.5 CAL) 600 mL (05/14/23 1959)    PRN Medications: docusate, mouth rinse, phenol, white petrolatum    Patient Profile  83 y/o female w/ 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 Septic shock 2/2 PNA.    Assessment/Plan  1. Acute on Chronic Systolic Heart Failure Nonischemic cardiomyopathy. Cath in 8/23 with no significant CAD, CI 2.24. ?LBBB cardiomyopathy versus familial versus prior myocarditis. Echo in 12/23 with EF < 20%, moderately reduced RV function, D-shaped septum, mild-moderate MR. She was admitted 1/24 with volume overload, empirically started on milrinone 0.25 and Lasix gtt.  Cardiac MRI showed LV severely dilated with EF 16% and septal-lateral dyssynchrony, RV moderately dilated with EF 23%, mid-apical inferolateral LGE in prior infarction pattern. However, CAD does not explain the extent of her cardiomyopathy based on cath. Echo in 5/23 showed EF <20% with moderate LV enlargement, septal-lateral dyssynchrony, moderate RV dysfunction, PASP 65 mmHg. Underwent CRT-P 5/24.  Echo 9/24 showed that EF is still 20-25%. She was noted to only be BiV pacing 87% of the time, frequent PVCs noted on ECG. Concern that PVCs were decreasing her BiV % and worsen LV function. - Admitted w/ mixed septic and cardiogenic shock. Overall improved and off NE and Milrinone - 3/10 CXR with vascular congestion.  - Volume status elevated. Increase lasix 80 mg twice a day.  - GDMT limited by elevated SCr/AKI  - Device interrogation to reassess BiV pacing %   2. Acute Hypoxic Respiratory Failure - overall improved but c/w wheezing on exam  - doxy + ceftriaxone through today, 3/10 for PNA - inhalers for COPD per IM - Increase lasix today.     3. AKI on CKD IIIb - baseline SCr ~2.2 - SCr peaked to 4.87 this admit, in setting of shock - improving, SCr continues to trend down,  unchanged 2.5 today.    - follow BMP w/ lasix today    4. Shock Liver - improving - AST 2,916>>236 - ALT 2,618>>980  - continue to follow   5. Thrombocytopenia - Plts trending back up 33K -->51 - suspect 2/2 critical illness, monitor closely  - Hematology consulted- suspect r/t CHF.    6. Hypernatremia - Na 146 - FW boluses  - monitor    Length of Stay: 7  Elwanda Moger, NP  05/15/2023, 8:29 AM  Advanced Heart Failure Team Pager [phone] (M-F; 7a - 5p)  Please contact [facility] for night-coverage after hours (5p -7a ) and weekends on [email or web address]

## 2023-05-15 NOTE — Progress Notes (Signed)
 RN gave update to Son, [name]. Will come to see patient today.

## 2023-05-16 ENCOUNTER — Inpatient Hospital Stay (HOSPITAL_COMMUNITY)

## 2023-05-16 DIAGNOSIS — N1832 Chronic kidney disease, stage 3b: Secondary | ICD-10-CM | POA: Diagnosis not present

## 2023-05-16 DIAGNOSIS — J9601 Acute respiratory failure with hypoxia: Secondary | ICD-10-CM | POA: Diagnosis not present

## 2023-05-16 DIAGNOSIS — I5023 Acute on chronic systolic (congestive) heart failure: Secondary | ICD-10-CM | POA: Diagnosis not present

## 2023-05-16 DIAGNOSIS — A419 Sepsis, unspecified organism: Secondary | ICD-10-CM | POA: Diagnosis not present

## 2023-05-16 DIAGNOSIS — N179 Acute kidney failure, unspecified: Secondary | ICD-10-CM | POA: Diagnosis not present

## 2023-05-16 LAB — BASIC METABOLIC PANEL
Anion gap: 9 (ref 5–15)
BUN: 95 mg/dL — ABNORMAL HIGH (ref 8–23)
CO2: 23 mmol/L (ref 22–32)
Calcium: 8.6 mg/dL — ABNORMAL LOW (ref 8.9–10.3)
Chloride: 112 mmol/L — ABNORMAL HIGH (ref 98–111)
Creatinine, Ser: 2.27 mg/dL — ABNORMAL HIGH (ref 0.44–1.00)
GFR, Estimated: 21 mL/min — ABNORMAL LOW (ref >60)
Glucose, Bld: 100 mg/dL — ABNORMAL HIGH (ref 70–99)
Potassium: 4.1 mmol/L (ref 3.5–5.1)
Sodium: 144 mmol/L (ref 135–145)

## 2023-05-16 LAB — CBC
HCT: 30.4 % — ABNORMAL LOW (ref 36.0–46.0)
Hemoglobin: 10.2 g/dL — ABNORMAL LOW (ref 12.0–15.0)
MCH: 32.7 pg (ref 26.0–34.0)
MCHC: 33.6 g/dL (ref 30.0–36.0)
MCV: 97.4 fL (ref 80.0–100.0)
Platelets: 58 10*3/uL — ABNORMAL LOW (ref 150–400)
RBC: 3.12 MIL/uL — ABNORMAL LOW (ref 3.87–5.11)
RDW: 18.4 % — ABNORMAL HIGH (ref 11.5–15.5)
WBC: 7.5 10*3/uL (ref 4.0–10.5)
nRBC: 0 % (ref 0.0–0.2)

## 2023-05-16 LAB — BLOOD GAS, VENOUS
Acid-Base Excess: 4 mmol/L — ABNORMAL HIGH (ref 0.0–2.0)
Bicarbonate: 29.2 mmol/L — ABNORMAL HIGH (ref 20.0–28.0)
O2 Saturation: 70.4 %
Patient temperature: 36.7
pCO2, Ven: 44 mmHg (ref 44–60)
pH, Ven: 7.42 (ref 7.25–7.43)
pO2, Ven: 44 mmHg (ref 32–45)

## 2023-05-16 LAB — GLUCOSE, CAPILLARY
Glucose-Capillary: 100 mg/dL — ABNORMAL HIGH (ref 70–99)
Glucose-Capillary: 105 mg/dL — ABNORMAL HIGH (ref 70–99)
Glucose-Capillary: 117 mg/dL — ABNORMAL HIGH (ref 70–99)
Glucose-Capillary: 140 mg/dL — ABNORMAL HIGH (ref 70–99)
Glucose-Capillary: 285 mg/dL — ABNORMAL HIGH (ref 70–99)
Glucose-Capillary: 90 mg/dL (ref 70–99)

## 2023-05-16 LAB — MAGNESIUM: Magnesium: 2.3 mg/dL (ref 1.7–2.4)

## 2023-05-16 MED ORDER — MIDODRINE HCL 5 MG PO TABS
10.0000 mg | ORAL_TABLET | ORAL | Status: AC
  Administered 2023-05-16: 10 mg
  Filled 2023-05-16: qty 2

## 2023-05-16 MED ORDER — FUROSEMIDE 10 MG/ML IJ SOLN
80.0000 mg | Freq: Two times a day (BID) | INTRAMUSCULAR | Status: AC
  Administered 2023-05-16 (×2): 80 mg via INTRAVENOUS
  Filled 2023-05-16 (×2): qty 8

## 2023-05-16 MED ORDER — FUROSEMIDE 10 MG/ML IJ SOLN
20.0000 mg | INTRAMUSCULAR | Status: AC
  Administered 2023-05-16: 20 mg via INTRAVENOUS
  Filled 2023-05-16: qty 2

## 2023-05-16 NOTE — Progress Notes (Signed)
 Nutrition Follow-up  DOCUMENTATION CODES:   Non-severe (moderate) malnutrition in context of chronic illness  INTERVENTION:   -D/c calorie count -Continue MVI with minerals daily -Continue Ensure Enlive po BID, each supplement provides 350 kcal and 20 grams of protein -Continue Magic cup TID with meals, each supplement provides 290 kcal and 9 grams of protein  -D/c TF and cortrak tube per discussion with MD  NUTRITION DIAGNOSIS:   Moderate Malnutrition related to chronic illness (COPD, CHF) as evidenced by severe muscle depletion, mild fat depletion.  Ongoing  GOAL:   Patient will meet greater than or equal to 90% of their needs  Progressing   MONITOR:   TF tolerance, I & O's, Labs, Weight trends  REASON FOR ASSESSMENT:   Consult Enteral/tube feeding initiation and management  ASSESSMENT:   Pt with a hx of HTN, CHF, COPD, GERD, gout, prior CVA, CKD4, and DM presented to ED after pt found with AMS and CBG of 40 on a wellness check.  3/4 - admitted to ICU 3/6 - BSE, NPO recommended 3/7 - cortrak placement pending (per KUB on 05/11/23- tip of tube in stomach) 3/9- s/p BSE, FEES, MBSS- advanced to dysphagia 2 diet with thin liquids 3/12- s/p BSE- advanced to regular diet with thin liquids  Reviewed I/O's: -500 ml x 24 hours and +1.9 L since admission  UOP: 1.7 L x 24 hours  Noted that nocturnal TF on hold last night due to increased work of breathing per [name] notes.   Spoke with pt at bedside, who was pleasant and in good spirits today. She reports her intake has greatly improved, especially since she has been advanced to a regular diet. Pt appreciative that she is eating more (75% of her meals) and that she has increased food selections, which will help her eat better. Pt also enjoys Biochemist, clinical and sipping on Ensure supplements. Discussed importance of good meal and supplement intake to promote healing. Pt is confident that she will be able to meet her nutritional needs  with meals and supplements and reports that she thinks she will eat with better with NGT out.   3/11-3/12 Breakfast: 241 kcals, 12 grams protein Lunch: 400 kcals, 7 grams protein Dinner: 296 kcals, 10 grams protein Supplements: 937 kcals, 29 grams protein  Total intake: 937 kcal (62% of minimum estimated needs)  29 grams protein (40% of minimum estimated needs)  Wt stable over the past week.   Per TOC notes, plan for SNF placement at discharge.   Medications reviewed and include miralax.   Labs reviewed: CBGS: 90-140 (inpatient orders for glycemic control are none).    Diet Order:   Diet Order             Diet regular Room service appropriate? Yes with Assist; Fluid consistency: Thin  Diet effective now                   EDUCATION NEEDS:   Not appropriate for education at this time  Skin:  Skin Assessment: Reviewed RN Assessment  Last BM:  05/12/23  Height:   Ht Readings from Last 1 Encounters:  05/08/23 5\' 2"  (1.575 m)    Weight:   Wt Readings from Last 1 Encounters:  05/16/23 73.6 kg    Ideal Body Weight:  50 kg  BMI:  Body mass index is 29.68 kg/m.  Estimated Nutritional Needs:   Kcal:  1500-1700 kcal/d  Protein:  75-90g/d  Fluid:  1.5L/d    Levada Schilling, RD,  LDN, CDCES Registered Dietitian III Certified Diabetes Care and Education Specialist If unable to reach this RD, please use "RD Inpatient" group chat on secure chat between hours of 8am-4 pm daily

## 2023-05-16 NOTE — Plan of Care (Signed)
  Problem: Clinical Measurements: Goal: Cardiovascular complication will be avoided Outcome: Progressing   Problem: Elimination: Goal: Will not experience complications related to urinary retention Outcome: Progressing   Problem: Safety: Goal: Ability to remain free from injury will improve Outcome: Progressing   Problem: Metabolic: Goal: Ability to maintain appropriate glucose levels will improve Outcome: Progressing

## 2023-05-16 NOTE — Progress Notes (Signed)
 Physical Therapy Treatment Patient Details Name: Heather [name] MRN: [id number] DOB: 01/29/1941 Today's Date: 05/16/2023   History of Present Illness 83 y/o woman admitted from [facility] 3/4 with confusion, hypoglycemia, CAP and shock after wellfare check. PMhx: HFrEF, HTN, COPD, DM    PT Comments  Pt asleep on entry, rouses slowly, but is agreeable to getting up with therapy. After PT assisted pt with washing her face pt requires maxA for coming to the EoB. On edge of bed, pt able to perform LE exercises but fatigues quickly. Pt requires total A for laterally scooting hips along EoB. Pt requires total A for returning to bed.  D/c plan remains appropriate at this time. PT will continue to follow acutely.    If plan is discharge home, recommend the following: A little help with walking and/or transfers;A lot of help with bathing/dressing/bathroom;Assistance with cooking/housework;Assist for transportation   Can travel by private vehicle     Yes  Equipment Recommendations  Rolling walker (2 wheels);BSC/3in1       Precautions / Restrictions Precautions Precautions: Fall Restrictions Weight Bearing Restrictions Per Provider Order: No     Mobility  Bed Mobility Overal bed mobility: Needs Assistance Bed Mobility: Supine to Sit, Sit to Supine     Supine to sit: HOB elevated, Used rails, Max assist     General bed mobility comments: maxA for management of LE off bed and bringing trunk to upright, total A for returning LE to bed and for squaring hips and shoulders in bed    Transfers Overall transfer level: Needs assistance                Lateral/Scoot Transfers: Total assist General transfer comment: pt very fatigued and unable to participate in transfer to recliner, total A for lateral scooting hips towards HoB for return to supine.          Balance Overall balance assessment: Needs assistance Sitting-balance support: No upper extremity supported, Feet supported Sitting  balance-Leahy Scale: Fair                                      Hotel manager: No apparent difficulties  Cognition Arousal: Alert Behavior During Therapy: Flat affect   PT - Cognitive impairments: No family/caregiver present to determine baseline, Difficult to assess, Orientation, Awareness, Memory, Attention, Initiation, Problem solving, Safety/Judgement   Orientation impairments: Time, Situation, Place                   PT - Cognition Comments: slow processing, needs assistance with initiating movement Following commands: Impaired Following commands impaired: Follows one step commands inconsistently, Follows one step commands with increased time    Cueing Cueing Techniques: Verbal cues, Gestural cues, Tactile cues, Visual cues  Exercises General Exercises - Lower Extremity Long Arc Quad: AROM, Both, 5 reps, Seated Hip ABduction/ADduction: AROM, Both, 5 reps, Seated Hip Flexion/Marching: AROM, Both, 5 reps, Seated Toe Raises: AROM, Both, 5 reps, Seated Heel Raises: AROM, Both, 5 reps, Seated    General Comments General comments (skin integrity, edema, etc.): Pt asleep on entry, rouses slowly, needs hand over hand assist to wash face      Pertinent Vitals/Pain Pain Assessment Pain Assessment: Faces Faces Pain Scale: Hurts little more Pain Location: B LE with movement Pain Descriptors / Indicators: Grimacing, Guarding Pain Intervention(s): Limited activity within patient's tolerance, Monitored during session, Repositioned     PT Goals (  current goals can now be found in the care plan section) Acute Rehab PT Goals PT Goal Formulation: Patient unable to participate in goal setting Time For Goal Achievement: 05/24/23 Potential to Achieve Goals: Fair Progress towards PT goals: Progressing toward goals    Frequency    Min 2X/week       AM-PAC PT "6 Clicks" Mobility   Outcome Measure  Help needed turning from your  back to your side while in a flat bed without using bedrails?: A Little Help needed moving from lying on your back to sitting on the side of a flat bed without using bedrails?: A Little Help needed moving to and from a bed to a chair (including a wheelchair)?: A Little Help needed standing up from a chair using your arms (e.g., wheelchair or bedside chair)?: A Lot Help needed to walk in hospital room?: Total Help needed climbing 3-5 steps with a railing? : Total 6 Click Score: 13    End of Session Equipment Utilized During Treatment: Gait belt Activity Tolerance: Patient tolerated treatment well Patient left: in bed;with bed alarm set Nurse Communication: Mobility status PT Visit Diagnosis: Other abnormalities of gait and mobility (R26.89);Difficulty in walking, not elsewhere classified (R26.2);Muscle weakness (generalized) (M62.81)     Time: 2956-2130 PT Time Calculation (min) (ACUTE ONLY): 23 min  Charges:    $Therapeutic Exercise: 8-22 mins $Therapeutic Activity: 8-22 mins PT General Charges $$ ACUTE PT VISIT: 1 Visit                     Anel Creighton B. Beverely Risen PT, DPT Acute Rehabilitation Services Please use secure chat or  Call Office [phone]    Elon Alas [facility] 05/16/2023, 2:41 PM

## 2023-05-16 NOTE — Progress Notes (Signed)
 ABG collected and sent to the lab, lab notified.

## 2023-05-16 NOTE — Progress Notes (Addendum)
 Received a call from bedside RN regarding the patient being significantly drowsy.    Presented at bedside.  The patient is hypersomnolent however she is arousable to voices with repeated stimuli and is able to follow commands.  She answers questions appropriately with a weak voice and drifts back to sleep.  Frail-appearing, diffuse rales noted on lungs auscultation with increase work of breathing.  Cortrack feeding paused.  Stat chest x-ray and VBG on 2 L [place] ordered.   Personally reviewed chest x-ray which showed bilateral pleural effusions, cardiomegaly, and bilateral pulmonary infiltrates suspicious for pulmonary edema.    Due to soft Bps, 1 dose of midodrine 10 mg x 1 and 1 dose of IV Lasix 20 mg x 1 ordered.  Will continue to closely monitor and treat as indicated.   Time: 15 minutes.

## 2023-05-16 NOTE — Progress Notes (Signed)
 Advanced Heart Failure Rounding Note  Cardiologist: Marjo Bicker, MD  Chief Complaint:  Subjective:   3/10 Given 60 mg IV lasix x1 3/11 Diuretics increase to 80 mg twice a day. Negative 500 cc  Lethargic.    Objective:   Weight Range: 73.6 kg Body mass index is 29.68 kg/m.   Vital Signs:   Temp:  [98.1 F (36.7 C)-98.4 F (36.9 C)] 98.1 F (36.7 C) (03/11 2029) Pulse Rate:  [65-75] 65 (03/12 0656) Resp:  [16-20] 18 (03/12 0715) BP: (92-117)/(47-75) 92/59 (03/12 0318) SpO2:  [94 %-100 %] 100 % (03/12 0656) Weight:  [73.6 kg] 73.6 kg (03/12 0500) Last BM Date : 05/15/23  Weight change: Filed Weights   05/14/23 0310 05/15/23 0333 05/16/23 0500  Weight: 72.8 kg 74.4 kg 73.6 kg    Intake/Output:   Intake/Output Summary (Last 24 hours) at 05/16/2023 0911 Last data filed at 05/16/2023 0324 Gross per 24 hour  Intake 1200 ml  Output 1700 ml  Net -500 ml      Physical Exam  General:  Appears frail. Lethargic.  Neck: supple. JVP 8-9 Cor: PMI nondisplaced. Regular rate & rhythm. No rubs, gallops or murmurs. Lungs: Crackles throughout Abdomen: soft, nontender, nondistended.  Extremities: no cyanosis, clubbing, rash, edema Neuro: opens eyes.  Telemetry   SR  EKG    N/A   Labs    CBC Recent Labs    05/14/23 0531 05/15/23 0513 05/16/23 0441  WBC 10.1  9.9 9.3 7.5  NEUTROABS 8.2*  --   --   HGB 10.8*  10.6* 10.3* 10.2*  HCT 32.9*  32.4* 32.2* 30.4*  MCV 98.2  97.9 99.4 97.4  PLT 34*  33* 51* 58*   Basic Metabolic Panel Recent Labs    16/10/96 0531 05/15/23 0513 05/16/23 0441  NA 152* 146* 144  K 4.2 4.2 4.1  CL 119* 116* 112*  CO2 21* 21* 23  GLUCOSE 96 149* 100*  BUN 93* 96* 95*  CREATININE 2.50* 2.51* 2.27*  CALCIUM 9.0 8.3* 8.6*  MG 2.3  --  2.3   Liver Function Tests Recent Labs    05/14/23 0531  AST 236*  ALT 980*  ALKPHOS 131*  BILITOT 3.0*  PROT 5.3*  ALBUMIN 2.7*   No results for input(s): "LIPASE",  "AMYLASE" in the last 72 hours. Cardiac Enzymes No results for input(s): "CKTOTAL", "CKMB", "CKMBINDEX", "TROPONINI" in the last 72 hours.  BNP: BNP (last 3 results) Recent Labs    04/09/23 1144 05/08/23 2255 05/15/23 0513  BNP >4,500.0* >4,500.0* >4,500.0*    ProBNP (last 3 results) No results for input(s): "PROBNP" in the last 8760 hours.   D-Dimer No results for input(s): "DDIMER" in the last 72 hours. Hemoglobin A1C No results for input(s): "HGBA1C" in the last 72 hours. Fasting Lipid Panel No results for input(s): "CHOL", "HDL", "LDLCALC", "TRIG", "CHOLHDL", "LDLDIRECT" in the last 72 hours. Thyroid Function Tests No results for input(s): "TSH", "T4TOTAL", "T3FREE", "THYROIDAB" in the last 72 hours.  Invalid input(s): "FREET3"  Other results:   Imaging    DG CHEST PORT 1 VIEW Result Date: 05/16/2023 CLINICAL DATA:  Shortness of breath and respiratory distress. EXAM: PORTABLE CHEST 1 VIEW COMPARISON:  05/14/2023 FINDINGS: Enteric tube tip courses below the level of the GE junction. There is a left chest wall pacer device with leads in the right atrial appendage and right ventricle. Aortic atherosclerosis. Stable cardiomediastinal contours. Unchanged bilateral pleural effusions with basilar atelectasis. Mild interstitial edema. IMPRESSION: 1.  Unchanged bilateral pleural effusions with basilar atelectasis. 2. Mild interstitial edema. Electronically Signed   By: Signa Kell M.D.   On: 05/16/2023 06:59     Medications:     Scheduled Medications:  arformoterol  15 mcg Nebulization BID   budesonide (PULMICORT) nebulizer solution  0.5 mg Nebulization BID   famotidine  10 mg Oral Daily   feeding supplement  237 mL Oral BID BM   feeding supplement (PROSource TF20)  60 mL Per Tube Daily   free water  200 mL Per Tube Q4H   melatonin  3 mg Oral QHS   mouth rinse  15 mL Mouth Rinse 4 times per day   polyethylene glycol  17 g Oral Daily   revefenacin  175 mcg  Nebulization Daily   venlafaxine  37.5 mg Oral BID WC    Infusions:  feeding supplement (OSMOLITE 1.5 CAL) Stopped (05/16/23 0115)    PRN Medications: docusate, ipratropium-albuterol, mouth rinse, phenol, white petrolatum    Patient Profile  83 y/o female w/ 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 Septic shock 2/2 PNA.    Assessment/Plan  1. Acute on Chronic Systolic Heart Failure Nonischemic cardiomyopathy. Cath in 8/23 with no significant CAD, CI 2.24. ?LBBB cardiomyopathy versus familial versus prior myocarditis. Echo in 12/23 with EF < 20%, moderately reduced RV function, D-shaped septum, mild-moderate MR. She was admitted 1/24 with volume overload, empirically started on milrinone 0.25 and Lasix gtt.  Cardiac MRI showed LV severely dilated with EF 16% and septal-lateral dyssynchrony, RV moderately dilated with EF 23%, mid-apical inferolateral LGE in prior infarction pattern. However, CAD does not explain the extent of her cardiomyopathy based on cath. Echo in 5/23 showed EF <20% with moderate LV enlargement, septal-lateral dyssynchrony, moderate RV dysfunction, PASP 65 mmHg. Underwent CRT-P 5/24.  Echo 9/24 showed that EF is still 20-25%. She was noted to only be BiV pacing 87% of the time, frequent PVCs noted on ECG. Concern that PVCs were decreasing her BiV % and worsen LV function. - Admitted w/ mixed septic and cardiogenic shock. Overall improved and off NE and Milrinone - 3/10 CXR with vascular congestion.  - Volume a little better. Continue IV lasix today.  - GDMT limited by elevated SCr/AKI  - Device interrogation to reassess BiV pacing %   2. Acute Hypoxic Respiratory Failure - overall improved but c/w wheezing on exam  - doxy + ceftriaxone through today, 3/10 for PNA - inhalers for COPD per IM - Continue IV lasix    3. AKI on CKD IIIb - baseline SCr ~2.2 - SCr peaked to 4.87 this admit, in  setting of shock - improving, SCr continues to trend down, 2.3    - follow BMP w/ lasix today    4. Shock Liver - improving - AST 2,916>>236 - ALT 2,618>>980  - continue to follow   5. Thrombocytopenia - Plts trending back up 33K -->58 - suspect 2/2 critical illness, monitor closely  - Hematology consulted- suspect r/t CHF.    6. Hypernatremia -Resolved.   Given frailty declining condition would recommend Palliative Care.   Length of Stay: 8  [place] Ice, NP  05/16/2023, 9:11 AM  Advanced Heart Failure Team Pager [phone] (M-F; 7a - 5p)  Please contact [facility] for night-coverage after hours (5p -7a ) and weekends on [email or web address]

## 2023-05-16 NOTE — Progress Notes (Signed)
 Speech Language Pathology Treatment: Dysphagia  Patient Details Name: Heather [name] MRN: [id number] DOB: 10-04-1940 Today's Date: 05/16/2023 Time: 5784-6962 SLP Time Calculation (min) (ACUTE ONLY): 23 min  Assessment / Plan / Recommendation Clinical Impression  [name] was alert and interactive.  She brushed her teeth with set-up.  Needed assistance to put in dentures. Once in, she demonstrated normal mastication of solids. No s/s of aspiration with liquids, consistent with last week's MBS. Swallowing is quite functional. She feels she is ready to advance to regular solids. There are no further SLP needs - hopefully advancing diet to regular will help with appetite.  SLP will sign off. Pt/son agree with plan.    HPI HPI: 83yo female admitted 05/08/23 with weakness, AMS following police wellness visit. PMH: HFrEF, renal insuff, HTN, gout, longterm use of opiate analgesic, chronic low back pain, HLD, anxiety, COPD, GERD, AKI, DM, CKD4, sCHF. CXR = worsening airspace dz L>RLL. HeadCT = no acute finding      SLP Plan  All goals met      Recommendations for follow up therapy are one component of a multi-disciplinary discharge planning process, led by the attending physician.  Recommendations may be updated based on patient status, additional functional criteria and insurance authorization.    Recommendations  Diet recommendations: Regular;Thin liquid Liquids provided via: Cup;Straw Medication Administration: Whole meds with puree Supervision: Staff to assist with self feeding Postural Changes and/or Swallow Maneuvers: Seated upright 90 degrees                  Oral care BID     Dysphagia, oropharyngeal phase (R13.12)     All goals met    Heather Michael L. Samson Frederic, MA CCC/SLP Clinical Specialist - Acute Care SLP Acute Rehabilitation Services Office number [phone]  Heather [name]  05/16/2023, 1:21 PM

## 2023-05-16 NOTE — Progress Notes (Signed)
 Dr. Dayna Barker would like to d/c cortrak tube. Orders placed and d/c q4h cbgs as well.

## 2023-05-16 NOTE — Progress Notes (Signed)
 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 -Vasopressors subsequently weaned off, core track placed 3/7 for nutrition 3/10: Transfer to floor under TRH service  Significant imaging/procedure: TH : No acute CT finding. Age related volume loss. Mild chronic small-vessel ischemic change of the white matter CT CAP: Dependent atelectasis/consolidation in both lower lobes. 7 mm right middle lobe nodule. Right Spigelian hernia containing only fat.  CXR: Cardiomegaly. Worsening airspace disease at the left > right lung base which may be due to atelectasis or pneumonia. Possible small left effusion. ECHO: G HKN worse apex and septum with akinesis. LVEF 25% with regional wall motion abnormalities. Severely enlarged LV. G I DD. RV function normal.  Consultation: PCCM-admitting   Subjective: Patient seen and examined This morning more alert awake Overnight BP stable afebrile on 2 L [place] Patient was very somnolent and BP was soft ABG demonstrates pH 7.4 pCO2 44 pO2 44, labs drawn with creatinine down to 2.2, x-ray was also repeated that showed unchanged bilateral pleural effusions mild interstitial edema and received extra Lasix 20 mg IV   Assessment and Plan: Principal Problem:   Acute sepsis [name]) Active  Problems:   Cardiogenic shock (HCC)   Malnutrition of moderate degree   Shock-likely combination of sepsis and cardiogenic Severe acidosis Elevated troponin from demand ischemia: Patient managed IV antibiotics completed 3/10.Initially needed pressors, off pressors since 3/8 and BP soft. Blood culture 3/4- NGTD. RVP negative. MRSA PCR negative BP remains soft monitor closely.   Acute on chronic systolic CHF-cardiogenic shock NICM with EF 25%-previously 20 to 25% September 2024 BiV PCM in place since 07/2022 Elevated troponin likely demand ischemia: Current echo shows EF 25% with RWMA,G HKN, enlarged LV.  Has ongoing leg edema and fluid overload-, cardiology following closely received high-dose Lasix 3/11 and extra dose of Lasix 20 mg  3/12 am GDMT limited due to hypotension- hold home bisoprolol, hydralazine,Imdur, and amiodarone Cardiology following closely -added IV Lasix 80 mg twice daily x 2 doses- defer diuretic management to cardiology, ?midodrine initiation. Cont to monitor daily I/O,weight, electrolytes and net balance as below and cont on  salt/fluid restricted diet and monitor in tele. Net IO Since Admission: 1,811.6 mL [05/16/23 1133]  Filed Weights   05/14/23 0310 05/15/23 0333 05/16/23 0500  Weight: 72.8 kg 74.4 kg 73.6 kg    Recent Labs  Lab 05/11/23 0516 05/12/23 0310 05/13/23 0458 05/14/23 0531 05/15/23 0513 05/16/23 0441  BNP  --   --   --   --  >4,500.0*  --   BUN 116* 114* 98* 93* 96* 95*  CREATININE 4.24* 3.72* 2.88* 2.50* 2.51* 2.27*  K 3.5 3.4* 3.6 4.2 4.2 4.1  MG 2.2 2.4 2.3 2.3  --  2.3    Acute hypoxic respiratory failure CAP COPD history: Completed antibiotics 3/10.  Needing 2 L [place], continue budesonide [name] (on Trelegy at home)  cont diuretics as above.  AKI on CKD stage IV: Baseline creatinine 2.1-2.4.  Creatinine responded well  to IV Lasix.  Monitor Recent Labs    05/08/23 1155 05/08/23 1212 05/08/23 1805 05/09/23 0428  05/10/23 0344 05/11/23 0516 05/12/23 0310 05/13/23 0458 05/14/23 0531 05/15/23 0513 05/16/23 0441  BUN 99* 102* 102* 119* 119* 116* 114* 98* 93* 96* 95*  CREATININE 4.71* 4.60* 4.82* 4.84* 4.87* 4.24* 3.72* 2.88* 2.50* 2.51* 2.27*  CO2 13*  --  15* 22 23 25 24 24  21* 21* 23  K 4.9 4.8 4.4 4.0 3.3* 3.5 3.4* 3.6 4.2 4.2 4.1    Acute metabolic encephalopathy-on presentation confused: Patient had been lethargic weak.  Overnight more lethargic.  She will need skilled nursing.  Continue delirium precaution, ABG overnight with stable pCO2, likely her low cardiac output contributing  Moderate malnutrition/dysphagia: Augment diet. Speech following-DYS 2 diet started 3/9.On NGT feeding and wean as able on calorie count  HLD: Holding statin due to transaminitis.  Transaminitis Elevated INR/coagulopathy: Likely shock liver.  Monitor LFTs-appears to be downtrending.  Chronic anemia: Hemoglobin remains stable  in ~10 gm Recent Labs  Lab 05/12/23 0310 05/13/23 0458 05/14/23 0531 05/15/23 0513 05/16/23 0441  HGB 10.3* 10.2* 10.8*  10.6* 10.3* 10.2*  HCT 29.9* 30.4* 32.9*  32.4* 32.2* 30.4*    Acute thrombocytopenia: Platelet on admit 132k now at 33K -input appreciated from hematology.  Platelet improving.  Monitor  Recent Labs  Lab 05/12/23 0310 05/13/23 0458 05/14/23 0531 05/15/23 0513 05/16/23 0441  PLT 46* 36* 34*  33* 51* 58*    Hypernatremia: Likely from patient's after diuresis.  Sodium has stabilized Recent Labs  Lab 05/12/23 0310 05/13/23 0458 05/14/23 0531 05/15/23 0513 05/16/23 0441  NA 145 152* 152* 146* 144    Type 2 diabetes mellitus with hypoglycemia: CBG 40 for EMS. PTA on Jardiance and currently on hold. Recent Labs  Lab 05/15/23 1713 05/15/23 2033 05/16/23 0014 05/16/23 0409 05/16/23 0805  GLUCAP 143* 113* 105* 100* 90     7 mm RML nodule: Needs follow-up in 6 to 12 months and pulmonary or pcp to arrange.  Blood sugar  controlled  Chronic back pain Lumbar radiculitis Anxiety: Continue Effexor .  Hold narcotics  DVT prophylaxis: SCDs Start: 05/08/23 1806 Code Status:   Code Status: Full Code Family Communication: plan of care discussed with patient/none at bedside.  Son updated 3/11 Patient status is: Remains hospitalized because of severity of illness Level of care: Progressive   Dispo: The patient is from: lives alone            Anticipated disposition: SNF once volume status improves Objective: Vitals last 24 hrs: Vitals:   05/16/23 0656 05/16/23 0715 05/16/23 0827 05/16/23 1015  BP:   (!) 109/50 (!) 106/55  Pulse: 65  69   Resp:  18  18  Temp:    (!) 97.5 F (36.4 C)  TempSrc:    Oral  SpO2: 100%  99%   Weight:      Height:       Weight change: -0.8 kg  Physical Examination: General exam: alert awake, ill-appearing weak and frail lethargic HEENT:Oral mucosa moist, Ear/Nose WNL grossly Respiratory system: Bilaterally Ackles +,no use of accessory muscle Cardiovascular system: S1 & S2 +, No JVD. Gastrointestinal system: Abdomen soft,NT,ND, BS+ Nervous System: Alert, awake, moving all extremities,and following commands. Extremities: LE edema +,distal peripheral pulses palpable and warm.  Skin: No rashes,no icterus. MSK: Normal muscle bulk,tone, power  Medications reviewed:  Scheduled Meds:  arformoterol  15 mcg Nebulization BID   budesonide (PULMICORT) nebulizer solution  0.5 mg Nebulization BID   famotidine  10 mg Oral Daily   feeding supplement  237 mL Oral BID BM   feeding supplement (PROSource TF20)  60 mL Per Tube Daily   free water  200 mL Per Tube Q4H   furosemide  80 mg Intravenous BID   melatonin  3 mg Oral QHS   mouth rinse  15 mL Mouth Rinse 4 times per day   polyethylene glycol  17 g Oral Daily   revefenacin  175 mcg Nebulization Daily   venlafaxine  37.5 mg Oral BID WC   Continuous Infusions:  feeding supplement (OSMOLITE 1.5 CAL) Stopped (05/16/23 0115)     Diet Order             DIET DYS 2 Room service appropriate? Yes; Fluid consistency: Thin  Diet effective now                  Intake/Output Summary (Last 24 hours) at 05/16/2023 1133 Last data filed at 05/16/2023 1027 Gross per 24 hour  Intake 1080 ml  Output 1950 ml  Net -870 ml  Net IO Since Admission: 1,811.6 mL [05/16/23 1133]  Wt Readings from Last 3 Encounters:  05/16/23 73.6 kg  05/02/23 69.4 kg  04/09/23 66 kg   Unresulted Labs (From admission, onward)     Start     Ordered   05/16/23 0439  Blood gas, arterial  Once,   R        05/16/23 0439   05/15/23 0500  Basic metabolic panel  Daily,   R     Question:  Specimen collection method  Answer:  Lab=Lab collect   05/14/23 0834   05/15/23 0500  CBC  Daily,   R     Question:  Specimen collection method  Answer:  Lab=Lab collect   05/14/23 0834   05/10/23 1051  Expectorated Sputum Assessment w Gram Stain, Rflx to Resp Cult  Once,   R        05/10/23 1050          Data Reviewed: I have personally reviewed following labs and imaging studies CBC: Recent Labs  Lab 05/12/23 0310 05/13/23 0458 05/14/23 0531 05/15/23 0513 05/16/23 0441  WBC 11.1* 8.4 10.1  9.9 9.3 7.5  NEUTROABS  --   --  8.2*  --   --   HGB 10.3* 10.2* 10.8*  10.6* 10.3* 10.2*  HCT 29.9* 30.4* 32.9*  32.4* 32.2* 30.4*  MCV 93.1 93.8 98.2  97.9 99.4 97.4  PLT 46* 36* 34*  33* 51* 58*   Basic Metabolic Panel:  Recent Labs  Lab 05/10/23 0344 05/11/23 0516 05/12/23 0310 05/13/23 0458 05/14/23 0531 05/15/23 0513 05/16/23 0441  NA 139 143 145 152* 152* 146* 144  K 3.3* 3.5 3.4* 3.6 4.2 4.2 4.1  CL 100 107 111 118* 119* 116* 112*  CO2 23 25 24 24  21* 21* 23  GLUCOSE 96 90 152* 130* 96 149* 100*  BUN 119* 116* 114* 98* 93* 96* 95*  CREATININE 4.87* 4.24* 3.72* 2.88* 2.50* 2.51* 2.27*  CALCIUM 8.1* 8.5* 9.1 8.7* 9.0 8.3* 8.6*  MG 2.3 2.2 2.4 2.3 2.3  --  2.3  PHOS 5.5* 4.5 4.4  --   --   --   --    GFR: Estimated Creatinine  Clearance: 18.3 mL/min (A) (by C-G formula based on  SCr of 2.27 mg/dL (H)). Liver Function Tests:  Recent Labs  Lab 05/10/23 0344 05/11/23 0516 05/12/23 0310 05/14/23 0531  AST 2,916* 1,410* 912* 236*  ALT 2,618* 1,914* 1,666* 980*  ALKPHOS 163* 160* 149* 131*  BILITOT 3.9* 3.7* 4.0* 3.0*  PROT 4.5* 4.4* 4.7* 5.3*  ALBUMIN 2.6* 2.6* 2.8* 2.7*  No results for input(s): "LIPASE", "AMYLASE" in the last 168 hours.  Recent Labs  Lab 05/10/23 0344  AMMONIA 19  Coagulation Profile:  Recent Labs  Lab 05/10/23 0344 05/11/23 0516 05/12/23 0310  INR 3.9* 2.5* 1.9*  No results for input(s): "PROBNP" in the last 168 hours.  No results for input(s): "HGBA1C" in the last 72 hours. Recent Labs  Lab 05/15/23 1713 05/15/23 2033 05/16/23 0014 05/16/23 0409 05/16/23 0805  GLUCAP 143* 113* 105* 100* 90   No results for input(s): "PROCALCITON", "LATICACIDVEN" in the last 168 hours.  Recent Results (from the past 240 hours)  Blood Culture (routine x 2)     Status: None   Collection Time: 05/08/23 11:55 AM   Specimen: BLOOD  Result Value Ref Range Status   Specimen Description BLOOD RIGHT ANTECUBITAL  Final   Special Requests   Final    BOTTLES DRAWN AEROBIC AND ANAEROBIC Blood Culture adequate volume   Culture   Final    NO GROWTH 5 DAYS Performed at [facility], [address] [place], [place] [address]    Report Status 05/13/2023 FINAL  Final  Blood Culture (routine x 2)     Status: None   Collection Time: 05/08/23 12:15 PM   Specimen: BLOOD  Result Value Ref Range Status   Specimen Description BLOOD BLOOD RIGHT WRIST  Final   Special Requests   Final    BOTTLES DRAWN AEROBIC ONLY Blood Culture results may not be optimal due to an inadequate volume of blood received in culture bottles   Culture   Final    NO GROWTH 5 DAYS Performed at [facility], [address] [place], [place] [address]    Report Status 05/13/2023 FINAL  Final  Resp panel by RT-PCR (RSV, Flu A&B, Covid)  Anterior Nasal Swab     Status: None   Collection Time: 05/08/23 12:27 PM   Specimen: Anterior Nasal Swab  Result Value Ref Range Status   SARS Coronavirus 2 by RT PCR NEGATIVE NEGATIVE Final    Comment: (NOTE) SARS-CoV-2 target nucleic acids are NOT DETECTED.  The SARS-CoV-2 RNA is generally detectable in upper respiratory specimens during the acute phase of infection. The lowest concentration of SARS-CoV-2 viral copies this assay can detect is 138 copies/mL. A negative result does not preclude SARS-Cov-2 infection and should not be used as the sole basis for treatment or other patient management decisions. A negative result may occur with  improper specimen collection/handling, submission of specimen other than nasopharyngeal swab, presence of viral mutation(s) within the areas targeted by this assay, and inadequate number of viral copies(<138 copies/mL). A negative result must be combined with clinical observations, patient history, and epidemiological information. The expected result is Negative.  Fact Sheet for Patients:  BloggerCourse.com  Fact Sheet for Healthcare Providers:  SeriousBroker.it  This test is no t yet approved or cleared by the [place] FDA and  has been authorized for detection and/or diagnosis of SARS-CoV-2 by FDA under an Emergency Use Authorization (EUA). This EUA will remain  in effect (meaning this test can be used) for the duration of the COVID-19 declaration under Section 564(b)(1) of the  Act, 21 U.S.C.section 360bbb-3(b)(1), unless the authorization is terminated  or revoked sooner.       Influenza A by PCR NEGATIVE NEGATIVE Final   Influenza B by PCR NEGATIVE NEGATIVE Final    Comment: (NOTE) The Xpert Xpress SARS-CoV-2/FLU/RSV plus assay is intended as an aid in the diagnosis of influenza from Nasopharyngeal swab specimens and should not be used as a sole basis for treatment. Nasal washings  and aspirates are unacceptable for Xpert Xpress SARS-CoV-2/FLU/RSV testing.  Fact Sheet for Patients: BloggerCourse.com  Fact Sheet for Healthcare Providers: SeriousBroker.it  This test is not yet approved or cleared by the [place] FDA and has been authorized for detection and/or diagnosis of SARS-CoV-2 by FDA under an Emergency Use Authorization (EUA). This EUA will remain in effect (meaning this test can be used) for the duration of the COVID-19 declaration under Section 564(b)(1) of the Act, 21 U.S.C. section 360bbb-3(b)(1), unless the authorization is terminated or revoked.     Resp Syncytial Virus by PCR NEGATIVE NEGATIVE Final    Comment: (NOTE) Fact Sheet for Patients: BloggerCourse.com  Fact Sheet for Healthcare Providers: SeriousBroker.it  This test is not yet approved or cleared by the [place] FDA and has been authorized for detection and/or diagnosis of SARS-CoV-2 by FDA under an Emergency Use Authorization (EUA). This EUA will remain in effect (meaning this test can be used) for the duration of the COVID-19 declaration under Section 564(b)(1) of the Act, 21 U.S.C. section 360bbb-3(b)(1), unless the authorization is terminated or revoked.  Performed at [facility], [address], [place], [place] [address]   Respiratory (~20 pathogens) panel by PCR     Status: None   Collection Time: 05/08/23  6:09 PM   Specimen: Nasopharyngeal Swab; Respiratory  Result Value Ref Range Status   Adenovirus NOT DETECTED NOT DETECTED Final   Coronavirus 229E NOT DETECTED NOT DETECTED Final    Comment: (NOTE) The Coronavirus on the Respiratory Panel, DOES NOT test for the novel  Coronavirus (2019 nCoV)    Coronavirus HKU1 NOT DETECTED NOT DETECTED Final   Coronavirus NL63 NOT DETECTED NOT DETECTED Final   Coronavirus OC43 NOT DETECTED NOT DETECTED Final    Metapneumovirus NOT DETECTED NOT DETECTED Final   Rhinovirus / Enterovirus NOT DETECTED NOT DETECTED Final   Influenza A NOT DETECTED NOT DETECTED Final   Influenza B NOT DETECTED NOT DETECTED Final   Parainfluenza Virus 1 NOT DETECTED NOT DETECTED Final   Parainfluenza Virus 2 NOT DETECTED NOT DETECTED Final   Parainfluenza Virus 3 NOT DETECTED NOT DETECTED Final   Parainfluenza Virus 4 NOT DETECTED NOT DETECTED Final   Respiratory Syncytial Virus NOT DETECTED NOT DETECTED Final   Bordetella pertussis NOT DETECTED NOT DETECTED Final   Bordetella Parapertussis NOT DETECTED NOT DETECTED Final   Chlamydophila pneumoniae NOT DETECTED NOT DETECTED Final   Mycoplasma pneumoniae NOT DETECTED NOT DETECTED Final    Comment: Performed at [facility], [address], [place], [place] [address]  MRSA Next Gen by PCR, Nasal     Status: None   Collection Time: 05/08/23  6:13 PM   Specimen: Nasal Mucosa; Nasal Swab  Result Value Ref Range Status   MRSA by PCR Next Gen NOT DETECTED NOT DETECTED Final    Comment: (NOTE) The GeneXpert MRSA Assay (FDA approved for NASAL specimens only), is one component of a comprehensive MRSA colonization surveillance program. It is not intended to diagnose MRSA infection nor to guide or monitor treatment for MRSA  infections. Test performance is not FDA approved in patients less than 67 years old. Performed at [facility], [address] [place], [place] [address]   Antimicrobials/Microbiology: Anti-infectives (From admission, onward)    Start     Dose/Rate Route Frequency Ordered Stop   05/09/23 1400  cefTRIAXone (ROCEPHIN) 2 g in sodium chloride 0.9 % 100 mL IVPB        2 g 200 mL/hr over 30 Minutes Intravenous Every 24 hours 05/09/23 1300 05/14/23 1429   05/09/23 1200  ceFEPIme (MAXIPIME) 1 g in sodium chloride 0.9 % 100 mL IVPB  Status:  Discontinued        1 g 200 mL/hr over 30 Minutes Intravenous Every 24 hours 05/08/23 1825 05/09/23 1300    05/08/23 2000  doxycycline (VIBRAMYCIN) 100 mg in sodium chloride 0.9 % 250 mL IVPB        100 mg 125 mL/hr over 120 Minutes Intravenous Every 12 hours 05/08/23 1912 05/15/23 1032   05/08/23 1820  vancomycin variable dose per unstable renal function (pharmacist dosing)  Status:  Discontinued         Does not apply See admin instructions 05/08/23 1821 05/09/23 1300   05/08/23 1300  vancomycin (VANCOREADY) IVPB 1250 mg/250 mL        1,250 mg 166.7 mL/hr over 90 Minutes Intravenous  Once 05/08/23 1258 05/08/23 1543   05/08/23 1245  ceFEPIme (MAXIPIME) 2 g in sodium chloride 0.9 % 100 mL IVPB        2 g 200 mL/hr over 30 Minutes Intravenous  Once 05/08/23 1243 05/08/23 1327   05/08/23 1245  metroNIDAZOLE (FLAGYL) IVPB 500 mg        500 mg 100 mL/hr over 60 Minutes Intravenous  Once 05/08/23 1243 05/08/23 1400   05/08/23 1245  vancomycin (VANCOCIN) IVPB 1000 mg/200 mL premix  Status:  Discontinued        1,000 mg 200 mL/hr over 60 Minutes Intravenous  Once 05/08/23 1243 05/08/23 1258         Component Value Date/Time   SDES BLOOD BLOOD RIGHT WRIST 05/08/2023 1215   SPECREQUEST  05/08/2023 1215    BOTTLES DRAWN AEROBIC ONLY Blood Culture results may not be optimal due to an inadequate volume of blood received in culture bottles   CULT  05/08/2023 1215    NO GROWTH 5 DAYS Performed at [facility], [address], [name], [place] [address]    REPTSTATUS 05/13/2023 FINAL 05/08/2023 1215   Radiology Studies: DG CHEST PORT 1 VIEW Result Date: 05/16/2023 CLINICAL DATA:  Shortness of breath and respiratory distress. EXAM: PORTABLE CHEST 1 VIEW COMPARISON:  05/14/2023 FINDINGS: Enteric tube tip courses below the level of the GE junction. There is a left chest wall pacer device with leads in the right atrial appendage and right ventricle. Aortic atherosclerosis. Stable cardiomediastinal contours. Unchanged bilateral pleural effusions with basilar atelectasis. Mild interstitial edema. IMPRESSION:  1. Unchanged bilateral pleural effusions with basilar atelectasis. 2. Mild interstitial edema. Electronically Signed   By: Signa Kell M.D.   On: 05/16/2023 06:59   LOS: 8 days   Total time spent in review of labs and imaging, patient evaluation, formulation of plan, documentation and communication with family: 50 minutes  Lanae Boast, MD  [facility]  05/16/2023, 11:33 AM

## 2023-05-17 DIAGNOSIS — J9601 Acute respiratory failure with hypoxia: Secondary | ICD-10-CM | POA: Diagnosis not present

## 2023-05-17 DIAGNOSIS — I5023 Acute on chronic systolic (congestive) heart failure: Secondary | ICD-10-CM | POA: Diagnosis not present

## 2023-05-17 DIAGNOSIS — N179 Acute kidney failure, unspecified: Secondary | ICD-10-CM | POA: Diagnosis not present

## 2023-05-17 DIAGNOSIS — A419 Sepsis, unspecified organism: Secondary | ICD-10-CM | POA: Diagnosis not present

## 2023-05-17 DIAGNOSIS — N1832 Chronic kidney disease, stage 3b: Secondary | ICD-10-CM | POA: Diagnosis not present

## 2023-05-17 LAB — CBC
HCT: 28.8 % — ABNORMAL LOW (ref 36.0–46.0)
Hemoglobin: 9.6 g/dL — ABNORMAL LOW (ref 12.0–15.0)
MCH: 32.4 pg (ref 26.0–34.0)
MCHC: 33.3 g/dL (ref 30.0–36.0)
MCV: 97.3 fL (ref 80.0–100.0)
Platelets: 85 10*3/uL — ABNORMAL LOW (ref 150–400)
RBC: 2.96 MIL/uL — ABNORMAL LOW (ref 3.87–5.11)
RDW: 18.7 % — ABNORMAL HIGH (ref 11.5–15.5)
WBC: 8.5 10*3/uL (ref 4.0–10.5)
nRBC: 0 % (ref 0.0–0.2)

## 2023-05-17 LAB — BASIC METABOLIC PANEL
Anion gap: 9 (ref 5–15)
BUN: 88 mg/dL — ABNORMAL HIGH (ref 8–23)
CO2: 29 mmol/L (ref 22–32)
Calcium: 8.5 mg/dL — ABNORMAL LOW (ref 8.9–10.3)
Chloride: 106 mmol/L (ref 98–111)
Creatinine, Ser: 2.35 mg/dL — ABNORMAL HIGH (ref 0.44–1.00)
GFR, Estimated: 20 mL/min — ABNORMAL LOW (ref >60)
Glucose, Bld: 83 mg/dL (ref 70–99)
Potassium: 3.4 mmol/L — ABNORMAL LOW (ref 3.5–5.1)
Sodium: 144 mmol/L (ref 135–145)

## 2023-05-17 LAB — GLUCOSE, CAPILLARY
Glucose-Capillary: 134 mg/dL — ABNORMAL HIGH (ref 70–99)
Glucose-Capillary: 163 mg/dL — ABNORMAL HIGH (ref 70–99)
Glucose-Capillary: 203 mg/dL — ABNORMAL HIGH (ref 70–99)
Glucose-Capillary: 91 mg/dL (ref 70–99)
Glucose-Capillary: 94 mg/dL (ref 70–99)

## 2023-05-17 MED ORDER — FUROSEMIDE 10 MG/ML IJ SOLN
60.0000 mg | Freq: Once | INTRAMUSCULAR | Status: AC
  Administered 2023-05-17: 60 mg via INTRAVENOUS

## 2023-05-17 MED ORDER — FUROSEMIDE 10 MG/ML IJ SOLN
80.0000 mg | Freq: Once | INTRAMUSCULAR | Status: DC
  Filled 2023-05-17: qty 8

## 2023-05-17 MED ORDER — POTASSIUM CHLORIDE 20 MEQ PO PACK: 20.0000 meq | PACK | Freq: Once | ORAL | Status: DC

## 2023-05-17 MED ORDER — POTASSIUM CHLORIDE 20 MEQ PO PACK
40.0000 meq | PACK | Freq: Once | ORAL | Status: AC
  Administered 2023-05-17: 40 meq via ORAL
  Filled 2023-05-17: qty 2

## 2023-05-17 NOTE — Progress Notes (Signed)
 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 -Vasopressors subsequently weaned off, core track placed 3/7 for nutrition 3/10: Transfer to floor under TRH service   Consultation: [facility] Cardiology (CH[facility])   Subjective: Breakfast is "edible"  Denies SOB   Assessment and Plan: Principal Problem:   Acute sepsis (HCC) Active Problems:   Cardiogenic shock (HCC)   Malnutrition of moderate degree   Shock-likely combination of sepsis and cardiogenic Severe acidosis Elevated troponin from demand ischemia: Patient managed IV antibiotics completed 3/10 -.Initially needed pressors, off pressors since 3/8 and BP soft. Blood culture 3/4- NGTD. RVP negative. MRSA PCR negative    Acute on chronic systolic CHF-cardiogenic shock NICM with EF 25%-previously 20 to 25% September 2024 BiV PCM in place since 07/2022 Elevated troponin likely demand ischemia: Current echo shows EF 25% with RWMA,G HKN, enlarged LV.  Has ongoing leg edema and fluid overload-, cardiology following closely received high-dose Lasix 3/11 and extra dose of Lasix 20 mg  3/12 am GDMT limited due to hypotension- hold home bisoprolol, hydralazine,Imdur, and amiodarone Cardiology following closely- defer diuretic management to  cardiology Cont to monitor daily I/O,weight, electrolytes and net balance as below and cont on  salt/fluid restricted diet and monitor in tele. Net IO Since Admission: -898.4 mL [05/17/23 1105] - if correct  Acute hypoxic respiratory failure CAP COPD history: Completed antibiotics 3/10.  Needing 2 L Intercourse, continue budesonide [name] (on Trelegy at home) cont diuretics as above.  AKI on CKD stage IV: Baseline creatinine 2.1-2.4.  Creatinine responded well  to IV Lasix.  Monitor  Acute metabolic encephalopathy-on presentation confused: -appears resolved  Moderate malnutrition/dysphagia: -calorie count in progress  HLD: Holding statin due to transaminitis.  Transaminitis Elevated INR/coagulopathy: Likely shock liver.  Monitor LFTs-appears to be downtrending.  Chronic anemia: Hemoglobin remains stable  in ~10 gm -appreciate oncology/hematology follow up   Acute thrombocytopenia: -trending up   Hypernatremia: Likely from diuresis.  Sodium has stabilized  Hypokalemia -replete  Type 2 diabetes mellitus with hypoglycemia: - PTA on Jardiance and currently on hold. -accuchecks  7 mm RML nodule: Needs follow-up in 6 to 12 months and pulmonary or pcp to arrange.  Blood sugar controlled  Chronic back pain Lumbar radiculitis Anxiety: Continue Effexor .  Hold narcotics  DVT prophylaxis: Place TED hose Start: 05/17/23 0930 SCDs Start: 05/08/23 1806 Code Status:   Code Status: Full Code Family Communication: plan of care discussed with patient/none at bedside Patient status is: Remains hospitalized because of severity of illness Level of care: Progressive   Dispo: The patient is from: lives alone            Anticipated disposition: SNF once volume status improves Objective: Vitals last 24 hrs: Vitals:   05/17/23 0825 05/17/23 0853 05/17/23 0854 05/17/23 1016  BP: 101/70   (!) 88/43  Pulse: 72     Resp:  20     Temp:      TempSrc:      SpO2: 96% 95% 95%    Weight:      Height:       Weight change: 0.6 kg  Physical Examination:  General: Appearance:     Overweight female in no acute distress     Lungs:      respirations unlabored  Heart:    Normal heart rate.   MS:   All extremities are intact.   Neurologic:   Awake, alert     Medications reviewed:  Scheduled Meds:  arformoterol  15 mcg Nebulization BID   budesonide (PULMICORT) nebulizer solution  0.5 mg Nebulization BID   famotidine  10 mg Oral Daily   feeding supplement  237 mL Oral BID BM   furosemide  80 mg Intravenous Once   melatonin  3 mg Oral QHS   mouth rinse  15 mL Mouth Rinse 4 times per day   polyethylene glycol  17 g Oral Daily   revefenacin  175 mcg Nebulization Daily   venlafaxine  37.5 mg Oral BID WC   Continuous Infusions:    Diet Order             Diet regular Room service appropriate? Yes with Assist; Fluid consistency: Thin  Diet effective now                  Intake/Output Summary (Last 24 hours) at 05/17/2023 1105 Last data filed at 05/17/2023 1610 Gross per 24 hour  Intake 340 ml  Output 3050 ml  Net -2710 ml  Net IO Since Admission: -898.4 mL [05/17/23 1105]  Wt Readings from Last 3 Encounters:  05/17/23 74.2 kg  05/02/23 69.4 kg  04/09/23 66 kg    Data Reviewed: I have personally reviewed following labs and imaging studies CBC: Recent Labs  Lab 05/13/23 0458 05/14/23 0531 05/15/23 0513 05/16/23 0441 05/17/23 0502  WBC 8.4 10.1  9.9 9.3 7.5 8.5  NEUTROABS  --  8.2*  --   --   --   HGB 10.2* 10.8*  10.6* 10.3* 10.2* 9.6*  HCT 30.4* 32.9*  32.4* 32.2* 30.4* 28.8*  MCV 93.8 98.2  97.9 99.4 97.4 97.3  PLT 36* 34*  33* 51* 58* 85*   Basic Metabolic Panel:  Recent Labs  Lab 05/11/23 0516 05/12/23 0310 05/13/23 0458 05/14/23 0531 05/15/23 0513 05/16/23 0441 05/17/23 0502  NA 143 145 152* 152* 146* 144 144  K 3.5 3.4* 3.6 4.2 4.2 4.1 3.4*  CL 107 111 118* 119* 116* 112* 106  CO2 25 24 24  21* 21* 23 29  GLUCOSE 90  152* 130* 96 149* 100* 83  BUN 116* 114* 98* 93* 96* 95* 88*  CREATININE 4.24* 3.72* 2.88* 2.50* 2.51* 2.27* 2.35*  CALCIUM 8.5* 9.1 8.7* 9.0 8.3* 8.6* 8.5*  MG 2.2 2.4 2.3 2.3  --  2.3  --   PHOS 4.5 4.4  --   --   --   --   --    GFR: Estimated Creatinine Clearance: 17.7 mL/min (A) (by C-G formula based on SCr of 2.35 mg/dL (H)). Liver Function Tests:  Recent Labs  Lab 05/11/23 0516 05/12/23 0310 05/14/23 0531  AST 1,410* 912* 236*  ALT 1,914* 1,666* 980*  ALKPHOS 160* 149* 131*  BILITOT 3.7* 4.0* 3.0*  PROT 4.4* 4.7* 5.3*  ALBUMIN 2.6* 2.8* 2.7*  No results for input(s): "LIPASE", "AMYLASE" in the last 168 hours.  No results  for input(s): "AMMONIA" in the last 168 hours. Coagulation Profile:  Recent Labs  Lab 05/11/23 0516 05/12/23 0310  INR 2.5* 1.9*  No results for input(s): "PROBNP" in the last 168 hours.  No results for input(s): "HGBA1C" in the last 72 hours. Recent Labs  Lab 05/16/23 1158 05/16/23 1623 05/16/23 2052 05/17/23 0020 05/17/23 0824  GLUCAP 117* 140* 285* 91 94   No results for input(s): "PROCALCITON", "LATICACIDVEN" in the last 168 hours.  Recent Results (from the past 240 hours)  Blood Culture (routine x 2)     Status: None   Collection Time: 05/08/23 11:55 AM   Specimen: BLOOD  Result Value Ref Range Status   Specimen Description BLOOD RIGHT ANTECUBITAL  Final   Special Requests   Final    BOTTLES DRAWN AEROBIC AND ANAEROBIC Blood Culture adequate volume   Culture   Final    NO GROWTH 5 DAYS Performed at [facility], [address], [place], [place] [address]    Report Status 05/13/2023 FINAL  Final  Blood Culture (routine x 2)     Status: None   Collection Time: 05/08/23 12:15 PM   Specimen: BLOOD  Result Value Ref Range Status   Specimen Description BLOOD BLOOD RIGHT WRIST  Final   Special Requests   Final    BOTTLES DRAWN AEROBIC ONLY Blood Culture results may not be optimal due to an inadequate volume of blood received in culture  bottles   Culture   Final    NO GROWTH 5 DAYS Performed at [facility], [address] [place], [place] [address]    Report Status 05/13/2023 FINAL  Final  Resp panel by RT-PCR (RSV, Flu A&B, Covid) Anterior Nasal Swab     Status: None   Collection Time: 05/08/23 12:27 PM   Specimen: Anterior Nasal Swab  Result Value Ref Range Status   SARS Coronavirus 2 by RT PCR NEGATIVE NEGATIVE Final    Comment: (NOTE) SARS-CoV-2 target nucleic acids are NOT DETECTED.  The SARS-CoV-2 RNA is generally detectable in upper respiratory specimens during the acute phase of infection. The lowest concentration of SARS-CoV-2 viral copies this assay can detect is 138 copies/mL. A negative result does not preclude SARS-Cov-2 infection and should not be used as the sole basis for treatment or other patient management decisions. A negative result may occur with  improper specimen collection/handling, submission of specimen other than nasopharyngeal swab, presence of viral mutation(s) within the areas targeted by this assay, and inadequate number of viral copies(<138 copies/mL). A negative result must be combined with clinical observations, patient history, and epidemiological information. The expected result is Negative.  Fact Sheet for Patients:  BloggerCourse.com  Fact Sheet for Healthcare Providers:  SeriousBroker.it  This test is no t yet approved or cleared by the [place] FDA and  has been authorized for detection and/or diagnosis of SARS-CoV-2 by FDA under an Emergency Use Authorization (EUA). This EUA will remain  in effect (meaning this test can be used) for the duration of the COVID-19 declaration under Section 564(b)(1) of the Act, 21 U.S.C.section 360bbb-3(b)(1), unless the authorization is terminated  or revoked sooner.       Influenza A by PCR NEGATIVE NEGATIVE Final   Influenza B by PCR NEGATIVE NEGATIVE Final    Comment:  (NOTE) The Xpert Xpress SARS-CoV-2/FLU/RSV plus assay is intended as an aid in the diagnosis of influenza from Nasopharyngeal swab specimens and should not be used as a sole basis for treatment. Nasal washings and  aspirates are unacceptable for Xpert Xpress SARS-CoV-2/FLU/RSV testing.  Fact Sheet for Patients: BloggerCourse.com  Fact Sheet for Healthcare Providers: SeriousBroker.it  This test is not yet approved or cleared by the [place] FDA and has been authorized for detection and/or diagnosis of SARS-CoV-2 by FDA under an Emergency Use Authorization (EUA). This EUA will remain in effect (meaning this test can be used) for the duration of the COVID-19 declaration under Section 564(b)(1) of the Act, 21 U.S.C. section 360bbb-3(b)(1), unless the authorization is terminated or revoked.     Resp Syncytial Virus by PCR NEGATIVE NEGATIVE Final    Comment: (NOTE) Fact Sheet for Patients: BloggerCourse.com  Fact Sheet for Healthcare Providers: SeriousBroker.it  This test is not yet approved or cleared by the [place] FDA and has been authorized for detection and/or diagnosis of SARS-CoV-2 by FDA under an Emergency Use Authorization (EUA). This EUA will remain in effect (meaning this test can be used) for the duration of the COVID-19 declaration under Section 564(b)(1) of the Act, 21 U.S.C. section 360bbb-3(b)(1), unless the authorization is terminated or revoked.  Performed at [facility], [address] [place], [place] [address]   Respiratory (~20 pathogens) panel by PCR     Status: None   Collection Time: 05/08/23  6:09 PM   Specimen: Nasopharyngeal Swab; Respiratory  Result Value Ref Range Status   Adenovirus NOT DETECTED NOT DETECTED Final   Coronavirus 229E NOT DETECTED NOT DETECTED Final    Comment: (NOTE) The Coronavirus on the Respiratory Panel, DOES NOT test  for the novel  Coronavirus (2019 nCoV)    Coronavirus HKU1 NOT DETECTED NOT DETECTED Final   Coronavirus NL63 NOT DETECTED NOT DETECTED Final   Coronavirus OC43 NOT DETECTED NOT DETECTED Final   Metapneumovirus NOT DETECTED NOT DETECTED Final   Rhinovirus / Enterovirus NOT DETECTED NOT DETECTED Final   Influenza A NOT DETECTED NOT DETECTED Final   Influenza B NOT DETECTED NOT DETECTED Final   Parainfluenza Virus 1 NOT DETECTED NOT DETECTED Final   Parainfluenza Virus 2 NOT DETECTED NOT DETECTED Final   Parainfluenza Virus 3 NOT DETECTED NOT DETECTED Final   Parainfluenza Virus 4 NOT DETECTED NOT DETECTED Final   Respiratory Syncytial Virus NOT DETECTED NOT DETECTED Final   Bordetella pertussis NOT DETECTED NOT DETECTED Final   Bordetella Parapertussis NOT DETECTED NOT DETECTED Final   Chlamydophila pneumoniae NOT DETECTED NOT DETECTED Final   Mycoplasma pneumoniae NOT DETECTED NOT DETECTED Final    Comment: Performed at [facility], [address], [place], [place] [address]  MRSA Next Gen by PCR, Nasal     Status: None   Collection Time: 05/08/23  6:13 PM   Specimen: Nasal Mucosa; Nasal Swab  Result Value Ref Range Status   MRSA by PCR Next Gen NOT DETECTED NOT DETECTED Final    Comment: (NOTE) The GeneXpert MRSA Assay (FDA approved for NASAL specimens only), is one component of a comprehensive MRSA colonization surveillance program. It is not intended to diagnose MRSA infection nor to guide or monitor treatment for MRSA infections. Test performance is not FDA approved in patients less than 108 years old. Performed at [facility], [address] [place], [place] [address]     Radiology Studies: DG CHEST PORT 1 VIEW Result Date: 05/16/2023 CLINICAL DATA:  Shortness of breath and respiratory distress. EXAM: PORTABLE CHEST 1 VIEW COMPARISON:  05/14/2023 FINDINGS: Enteric tube tip courses below the level of the GE junction. There is a left chest  wall pacer device with  leads in the right atrial appendage and right ventricle. Aortic atherosclerosis. Stable cardiomediastinal contours. Unchanged bilateral pleural effusions with basilar atelectasis. Mild interstitial edema. IMPRESSION: 1. Unchanged bilateral pleural effusions with basilar atelectasis. 2. Mild interstitial edema. Electronically Signed   By: Signa Kell M.D.   On: 05/16/2023 06:59   LOS: 9 days   Total time spent in review of labs and imaging, patient evaluation, formulation of plan, documentation and communication with family: 50 minutes  Joseph Art, DO  [facility]  05/17/2023, 11:05 AM

## 2023-05-17 NOTE — Plan of Care (Signed)
  Problem: Health Behavior/Discharge Planning: Goal: Ability to manage health-related needs will improve Outcome: Progressing   Problem: Clinical Measurements: Goal: Ability to maintain clinical measurements within normal limits will improve Outcome: Progressing Goal: Respiratory complications will improve Outcome: Progressing Goal: Cardiovascular complication will be avoided Outcome: Progressing   Problem: Coping: Goal: Level of anxiety will decrease Outcome: Progressing   Problem: Elimination: Goal: Will not experience complications related to urinary retention Outcome: Progressing   Problem: Safety: Goal: Ability to remain free from injury will improve Outcome: Progressing

## 2023-05-17 NOTE — Progress Notes (Addendum)
 Advanced Heart Failure Rounding Note  Cardiologist: Vishnu P Mallipeddi, MD  Chief Complaint: systolic heart failure  Subjective:    3.3L in UOP yesterday, net negative 2.8L for the day.   Scr 2.27>>2.35 but BUN improved, 95>>88.   Plts trending back up, 58>>85K.   Feels better today. Not lethargic. Feels she has slightly more energy today. Eating breakfast and says appetite is improving. Denies any current resting dyspnea but c/w productive cough w/ "brown colored" sputum.    Objective:   Weight Range: 74.2 kg Body mass index is 29.92 kg/m.   Vital Signs:   Temp:  [97.4 F (36.3 C)-98.5 F (36.9 C)] 98 F (36.7 C) (03/13 0335) Pulse Rate:  [70-77] 72 (03/13 0825) Resp:  [16-20] 20 (03/13 0825) BP: (95-110)/(52-70) 101/70 (03/13 0825) SpO2:  [90 %-100 %] 96 % (03/13 0825) Weight:  [74.2 kg] 74.2 kg (03/13 0451) Last BM Date : 05/15/23  Weight change: Filed Weights   05/15/23 0333 05/16/23 0500 05/17/23 0451  Weight: 74.4 kg 73.6 kg 74.2 kg    Intake/Output:   Intake/Output Summary (Last 24 hours) at 05/17/2023 0900 Last data filed at 05/17/2023 2952 Gross per 24 hour  Intake 460 ml  Output 3300 ml  Net -2840 ml      Physical Exam   General:  frail/weak/fatigued appearing, no respiratory difficulty  HEENT: normal Neck: supple. JVD 8 cm. Carotids 2+ bilat; no bruits. No lymphadenopathy or thyromegaly appreciated. Cor: PMI nondisplaced. Regular rate & rhythm. No rubs, gallops or murmurs. Lungs: clear Abdomen: soft, nontender, nondistended. No hepatosplenomegaly. No bruits or masses. Good bowel sounds. Extremities: no cyanosis, clubbing, rash, edema Neuro: alert & oriented x 3, cranial nerves grossly intact. moves all 4 extremities w/o difficulty. Affect pleasant.  Telemetry   NSR 90s   EKG    N/A   Labs    CBC Recent Labs    05/16/23 0441 05/17/23 0502  WBC 7.5 8.5  HGB 10.2* 9.6*  HCT 30.4* 28.8*  MCV 97.4 97.3  PLT 58* 85*   Basic  Metabolic Panel Recent Labs    84/13/24 0441 05/17/23 0502  NA 144 144  K 4.1 3.4*  CL 112* 106  CO2 23 29  GLUCOSE 100* 83  BUN 95* 88*  CREATININE 2.27* 2.35*  CALCIUM 8.6* 8.5*  MG 2.3  --    Liver Function Tests No results for input(s): "AST", "ALT", "ALKPHOS", "BILITOT", "PROT", "ALBUMIN" in the last 72 hours.  No results for input(s): "LIPASE", "AMYLASE" in the last 72 hours. Cardiac Enzymes No results for input(s): "CKTOTAL", "CKMB", "CKMBINDEX", "TROPONINI" in the last 72 hours.  BNP: BNP (last 3 results) Recent Labs    04/09/23 1144 05/08/23 2255 05/15/23 0513  BNP >4,500.0* >4,500.0* >4,500.0*    ProBNP (last 3 results) No results for input(s): "PROBNP" in the last 8760 hours.   D-Dimer No results for input(s): "DDIMER" in the last 72 hours. Hemoglobin A1C No results for input(s): "HGBA1C" in the last 72 hours. Fasting Lipid Panel No results for input(s): "CHOL", "HDL", "LDLCALC", "TRIG", "CHOLHDL", "LDLDIRECT" in the last 72 hours. Thyroid Function Tests No results for input(s): "TSH", "T4TOTAL", "T3FREE", "THYROIDAB" in the last 72 hours.  Invalid input(s): "FREET3"  Other results:   Imaging    No results found.    Medications:     Scheduled Medications:  arformoterol  15 mcg Nebulization BID   budesonide (PULMICORT) nebulizer solution  0.5 mg Nebulization BID   famotidine  10 mg Oral  Daily   feeding supplement  237 mL Oral BID BM   melatonin  3 mg Oral QHS   mouth rinse  15 mL Mouth Rinse 4 times per day   polyethylene glycol  17 g Oral Daily   potassium chloride  20 mEq Oral Once   revefenacin  175 mcg Nebulization Daily   venlafaxine  37.5 mg Oral BID WC    Infusions:    PRN Medications: docusate, ipratropium-albuterol, mouth rinse, phenol, white petrolatum    Patient Profile  83 y/o female w/ 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 Septic  shock 2/2 PNA.   Assessment/Plan  1. Acute on Chronic Systolic Heart Failure Nonischemic cardiomyopathy. Cath in 8/23 with no significant CAD, CI 2.24. ?LBBB cardiomyopathy versus familial versus prior myocarditis. Echo in 12/23 with EF < 20%, moderately reduced RV function, D-shaped septum, mild-moderate MR. She was admitted 1/24 with volume overload, empirically started on milrinone 0.25 and Lasix gtt.  Cardiac MRI showed LV severely dilated with EF 16% and septal-lateral dyssynchrony, RV moderately dilated with EF 23%, mid-apical inferolateral LGE in prior infarction pattern. However, CAD does not explain the extent of her cardiomyopathy based on cath. Echo in 5/23 showed EF <20% with moderate LV enlargement, septal-lateral dyssynchrony, moderate RV dysfunction, PASP 65 mmHg. Underwent CRT-P 5/24.  Echo 9/24 showed that EF is still 20-25%. She was noted to only be BiV pacing 87% of the time, frequent PVCs noted on ECG. Concern that PVCs were decreasing her BiV % and worsen LV function. - Admitted w/ mixed septic and cardiogenic shock. Overall improved and off NE and Milrinone but c/w mild volume overload. Good response to IV Lasix yesterday, SCr trending down  - repeat IV Lasix 60 mg x 1 today  - place TED hoses to help w/ LEE  - GDMT limited by elevated SCr/AKI    2. Acute Hypoxic Respiratory Failure - overall improved but c/w wheezing on exam + productive cough w/ brown sputum  - completed doxy + ceftriaxone for PNA, ? If need to restart. Consider obtaining sputum cx, will defer to IM  - inhalers for COPD per IM - Continue IV lasix    3. AKI on CKD IIIb - baseline SCr ~2.2 - SCr peaked to 4.87 this admit, in setting of shock - improving, SCr 2.3 today  - follow BMP w/ lasix today    4. Shock Liver - improving - AST 2,916>>236 - ALT 2,618>>980  - continue to follow, repeat CMP ordered for tomorrow    5. Thrombocytopenia - Plts trending back up 33K -->58->85K  - hematology has seen,  suspect 2/2 hepatic shock/ critical illness    6. Hypernatremia -Resolved.   7. Hypokalemia - K 3.4 w/ diuresis  - give K supp today  - follow BMP   8. GOC: given multiple co morbidities, frailty and declining condition, recommend palliative care for GOC discussion      Length of Stay: [address], PA-C  05/17/2023, 9:00 AM  Advanced Heart Failure Team Pager [phone] (M-F; 7a - 5p)  Please contact [facility] for night-coverage after hours (5p -7a ) and weekends on [email or web address]

## 2023-05-17 NOTE — Progress Notes (Signed)
 I am really not surprised that her platelet count started to come back up.  Her platelet count this morning is 85,000.  White count 8.5.  Hemoglobin 9.6.  I think this is all about her hepatic shock and possibly cardiogenic shock are resolving.  I think that her LFTs are improving.  They not been checked for 2 or 3 days.  There is no bleeding.  I suspect that she is was going to have an element of anemia secondary to her renal insufficiency.  We will just have to watch her hemoglobin.  Again, she probably would benefit from ESA.  I am sure that her erythropoietin level is quite low.  We will continue to follow along and try to help out any way that we can to improve her quality of life.  Christin Bach, MD  Ivin Booty 1:9

## 2023-05-18 DIAGNOSIS — A419 Sepsis, unspecified organism: Secondary | ICD-10-CM | POA: Diagnosis not present

## 2023-05-18 LAB — COMPREHENSIVE METABOLIC PANEL
ALT: 300 U/L — ABNORMAL HIGH (ref 0–44)
AST: 53 U/L — ABNORMAL HIGH (ref 15–41)
Albumin: 2.6 g/dL — ABNORMAL LOW (ref 3.5–5.0)
Alkaline Phosphatase: 105 U/L (ref 38–126)
Anion gap: 10 (ref 5–15)
BUN: 78 mg/dL — ABNORMAL HIGH (ref 8–23)
CO2: 26 mmol/L (ref 22–32)
Calcium: 8.5 mg/dL — ABNORMAL LOW (ref 8.9–10.3)
Chloride: 104 mmol/L (ref 98–111)
Creatinine, Ser: 2.19 mg/dL — ABNORMAL HIGH (ref 0.44–1.00)
GFR, Estimated: 22 mL/min — ABNORMAL LOW (ref >60)
Glucose, Bld: 102 mg/dL — ABNORMAL HIGH (ref 70–99)
Potassium: 3.6 mmol/L (ref 3.5–5.1)
Sodium: 140 mmol/L (ref 135–145)
Total Bilirubin: 2.3 mg/dL — ABNORMAL HIGH (ref 0.0–1.2)
Total Protein: 5.2 g/dL — ABNORMAL LOW (ref 6.5–8.1)

## 2023-05-18 LAB — EXPECTORATED SPUTUM ASSESSMENT W GRAM STAIN, RFLX TO RESP C

## 2023-05-18 LAB — GLUCOSE, CAPILLARY
Glucose-Capillary: 94 mg/dL (ref 70–99)
Glucose-Capillary: 140 mg/dL — ABNORMAL HIGH (ref 70–99)
Glucose-Capillary: 139 mg/dL — ABNORMAL HIGH (ref 70–99)

## 2023-05-18 LAB — CBC
HCT: 30.6 % — ABNORMAL LOW (ref 36.0–46.0)
Hemoglobin: 10.1 g/dL — ABNORMAL LOW (ref 12.0–15.0)
MCH: 32.4 pg (ref 26.0–34.0)
MCHC: 33 g/dL (ref 30.0–36.0)
MCV: 98.1 fL (ref 80.0–100.0)
Platelets: 116 10*3/uL — ABNORMAL LOW (ref 150–400)
RBC: 3.12 MIL/uL — ABNORMAL LOW (ref 3.87–5.11)
RDW: 19.4 % — ABNORMAL HIGH (ref 11.5–15.5)
WBC: 9.3 10*3/uL (ref 4.0–10.5)
nRBC: 0 % (ref 0.0–0.2)

## 2023-05-18 MED ORDER — CALCIUM CARBONATE ANTACID 500 MG PO CHEW
1.0000 | CHEWABLE_TABLET | Freq: Once | ORAL | Status: AC | PRN
  Administered 2023-05-18: 200 mg via ORAL
  Filled 2023-05-18: qty 1

## 2023-05-18 NOTE — Progress Notes (Signed)
 PROGRESS NOTE Heather [name]  RUE:[id number] DOB: Apr 30, 1940 DOA: 05/08/2023 PCP: [name], MD    Brief Narrative/Hospital Course: 83 year old female with multiple comorbidities including CKD IV B/L creat ~2-2.4,, T2DM chronic systolic CHF with EF 20-25% BiV PM in place since 07/2022, chronic pain syndrome and other comorbidities who on holdby police 3/4 found to have altered mental status last known normal the day prior, was hypoglycemic at 40 and brought to the ED which she was profoundly acidotic pH 6.69 pCO2 54, and found to have acute kidney failure, significant transaminitis lactic acidosis leukocytosis fever concerning for shock, and admitted to ICU on 05/08/23-on vasopressors bicarb drip broad-spectrum antibiotics and IV fluids. -Vasopressors subsequently weaned off, core track placed 3/7 for nutrition 3/10: Transferred to floor under [facility] service  Significant imaging/procedure: TH : No acute CT finding. Age related volume loss. Mild chronic small-vessel ischemic change of the white matter CT CAP: Dependent atelectasis/consolidation in both lower lobes. 7 mm right middle lobe nodule. Right Spigelian hernia containing only fat.  CXR: Cardiomegaly. Worsening airspace disease at the left > right lung base which may be due to atelectasis or pneumonia. Possible small left effusion. ECHO: G HKN worse apex and septum with akinesis. LVEF 25% with regional wall motion abnormalities. Severely enlarged LV. G I DD. RV function normal.  Consultation: PCCM-admitting Cardiology-AHF team Hematology oncology Palliative care    Subjective: Patient seen examined this morning Resting comfortably, alert awake oriented to self place says it is November 2023 She is not in distress Overnight BP soft at x 50s to 90s, this morning 119, saturating well on room air, HR/RR stable Labs with creatinine improved to 2.1 AST ALT downtrending 53/300,, CBC with a stable hemoglobinup at 10 gm and plt increased to  116 from 85k Weight further down 1 pound 7.1 pound from 163 lb.   Assessment and Plan: Principal Problem:   Acute sepsis [facility]) Active Problems:   Cardiogenic shock (HCC)   Malnutrition of moderate degree   Shock-likely combination of sepsis and cardiogenic Severe acidosis Elevated troponin from demand ischemia: Patient managed IV antibiotics completed 3/10>Initially needed pressors, off pressors since 3/8 and BP soft. Blood culture 3/4- NGTD. RVP negative. MRSA PCR negative.  Afebrile at this time.    Acute on chronic systolic CHF-cardiogenic shock NICM with EF 25%-previously 20 to 25% September 2024 BiV PCM in place since 07/2022 Elevated troponin likely demand ischemia: Current echo shows EF 25% with RWMA,G HKN, enlarged LV.  Diuretics/EDMD limited by hypotension, currently tolerating torsemide 60 mg continue same, repeat labs in 1 week. Continue to hold hold home bisoprolol, hydralazine,Imdur, and amiodarone Appreciate cardiology input, palliative care has been consulted Cont to monitor daily I/O,weight, electrolytes and net balance as below and cont on  salt/fluid restricted diet and monitor in tele. Net IO Since Admission: -2,108.4 mL [05/18/23 1358] - if correct  Acute hypoxic respiratory failure CAP COPD history: Completed antibiotics 3/10.  This morning doing well on room air.  Continue budesonide [name] (on Trelegy at home)   AKI on CKD stage IV: Baseline creatinine 2.1-2.4.  Renal function remains stable repeat in 1 week Recent Labs    05/09/23 0428 05/10/23 0344 05/11/23 0516 05/12/23 0310 05/13/23 0458 05/14/23 0531 05/15/23 0513 05/16/23 0441 05/17/23 0502 05/18/23 0546  BUN 119* 119* 116* 114* 98* 93* 96* 95* 88* 78*  CREATININE 4.84* 4.87* 4.24* 3.72* 2.88* 2.50* 2.51* 2.27* 2.35* 2.19*  CO2 22 23 25 24 24  21* 21* 23 29 26  K 4.0 3.3* 3.5 3.4* 3.6 4.2 4.2 4.1 3.4* 3.6    Acute metabolic encephalopathy-: on presentation confused.  Somewhat  mildly confused still, continue supportive care palliative care consulted  Moderate malnutrition/dysphagia: Off feeding tube.  Augment diet as tolerated dietitian following as below  Nutrition Problem: Moderate Malnutrition Etiology: chronic illness (COPD, CHF) Signs/Symptoms: severe muscle depletion, mild fat depletion Interventions: Refer to RD note for recommendations   HLD: Holding statin due to transaminitis.  Transaminitis Elevated INR/coagulopathy: Likely shock liver.  Monitor LFTs-appears to be downtrending.  Repeat in 1 week  Chronic anemia: Remains stable transfuse if less than 7    Acute thrombocytopenia: -trending up   Hypernatremia: Likely from diuresis.  Sodium has stabilized  Hypokalemia Stable  Type 2 diabetes mellitus with hypoglycemia: - PTA on Jardiance and currently on hold.  Blood sugar currently well-controlled on SSI. Recent Labs  Lab 05/17/23 1210 05/17/23 1657 05/17/23 2058 05/18/23 0720 05/18/23 1107  GLUCAP 203* 134* 163* 94 140*    7 mm RML nodule: Needs follow-up in 6 to 12 months and pulmonary or pcp to arrange.  Blood sugar controlled  Chronic back pain Lumbar radiculitis Anxiety: Continue Effexor .  Hold narcotics  Goals of care: Patient with complex comorbidities.  Advanced heart failure team following, suggest palliative care consultation, currently full code, palliative care has been consulted  DVT prophylaxis: Place TED hose Start: 05/17/23 0930 SCDs Start: 05/08/23 1806 Code Status:   Code Status: Full Code Family Communication: plan of care discussed with patient/none at bedside Patient status is: Remains hospitalized because of severity of illness Level of care: Progressive   Dispo: The patient is from: lives alone            Anticipated disposition: SNF pending palliative care meeting  Objective: Vitals last 24 hrs: Vitals:   05/17/23 1947 05/18/23 0347 05/18/23 0849 05/18/23 1112  BP: (!) 110/53 (!) 119/54   133/62  Pulse: 82 74  85  Resp: 20 19  16   Temp: 98.5 F (36.9 C) 98.6 F (37 C)  98.9 F (37.2 C)  TempSrc: Oral Oral  Oral  SpO2: 93% 92% 97% 91%  Weight:  71.3 kg    Height:       Weight change: -2.9 kg  Physical Examination:  General exam: alert awake, weak frail  HEENT:Oral mucosa moist, Ear/Nose WNL grossly Respiratory system: Bilaterally clear BS,no use of accessory muscle Cardiovascular system: S1 & S2 +, No JVD. Gastrointestinal system: Abdomen soft,NT,ND, BS+ Nervous System: Alert, awake, moving all extremities,and following commands. Extremities: LE edema mild,distal peripheral pulses palpable and warm.  Skin: No rashes,no icterus. MSK: Normal muscle bulk,tone, power   Medications reviewed:  Scheduled Meds:  arformoterol  15 mcg Nebulization BID   budesonide (PULMICORT) nebulizer solution  0.5 mg Nebulization BID   famotidine  10 mg Oral Daily   feeding supplement  237 mL Oral BID BM   melatonin  3 mg Oral QHS   mouth rinse  15 mL Mouth Rinse 4 times per day   polyethylene glycol  17 g Oral Daily   revefenacin  175 mcg Nebulization Daily   venlafaxine  37.5 mg Oral BID WC   Continuous Infusions:    Diet Order             Diet regular Room service appropriate? Yes with Assist; Fluid consistency: Thin  Diet effective now                  Intake/Output  Summary (Last 24 hours) at 05/18/2023 1358 Last data filed at 05/18/2023 0400 Gross per 24 hour  Intake 240 ml  Output 1450 ml  Net -1210 ml  Net IO Since Admission: -2,108.4 mL [05/18/23 1358]  Wt Readings from Last 3 Encounters:  05/18/23 71.3 kg  05/02/23 69.4 kg  04/09/23 66 kg    Data Reviewed: I have personally reviewed following labs and imaging studies CBC: Recent Labs  Lab 05/14/23 0531 05/15/23 0513 05/16/23 0441 05/17/23 0502 05/18/23 0546  WBC 10.1  9.9 9.3 7.5 8.5 9.3  NEUTROABS 8.2*  --   --   --   --   HGB 10.8*  10.6* 10.3* 10.2* 9.6* 10.1*  HCT 32.9*  32.4* 32.2*  30.4* 28.8* 30.6*  MCV 98.2  97.9 99.4 97.4 97.3 98.1  PLT 34*  33* 51* 58* 85* 116*   Basic Metabolic Panel:  Recent Labs  Lab 05/12/23 0310 05/13/23 0458 05/14/23 0531 05/15/23 0513 05/16/23 0441 05/17/23 0502 05/18/23 0546  NA 145 152* 152* 146* 144 144 140  K 3.4* 3.6 4.2 4.2 4.1 3.4* 3.6  CL 111 118* 119* 116* 112* 106 104  CO2 24 24 21* 21* 23 29 26   GLUCOSE 152* 130* 96 149* 100* 83 102*  BUN 114* 98* 93* 96* 95* 88* 78*  CREATININE 3.72* 2.88* 2.50* 2.51* 2.27* 2.35* 2.19*  CALCIUM 9.1 8.7* 9.0 8.3* 8.6* 8.5* 8.5*  MG 2.4 2.3 2.3  --  2.3  --   --   PHOS 4.4  --   --   --   --   --   --    GFR: Estimated Creatinine Clearance: 18.6 mL/min (A) (by C-G formula based on SCr of 2.19 mg/dL (H)). Liver Function Tests:  Recent Labs  Lab 05/12/23 0310 05/14/23 0531 05/18/23 0546  AST 912* 236* 53*  ALT 1,666* 980* 300*  ALKPHOS 149* 131* 105  BILITOT 4.0* 3.0* 2.3*  PROT 4.7* 5.3* 5.2*  ALBUMIN 2.8* 2.7* 2.6*  No results for input(s): "LIPASE", "AMYLASE" in the last 168 hours.  No results for input(s): "AMMONIA" in the last 168 hours. Coagulation Profile:  Recent Labs  Lab 05/12/23 0310  INR 1.9*  No results for input(s): "PROBNP" in the last 168 hours.  No results for input(s): "HGBA1C" in the last 72 hours. Recent Labs  Lab 05/17/23 1210 05/17/23 1657 05/17/23 2058 05/18/23 0720 05/18/23 1107  GLUCAP 203* 134* 163* 94 140*   No results for input(s): "PROCALCITON", "LATICACIDVEN" in the last 168 hours.  Recent Results (from the past 240 hours)  Respiratory (~20 pathogens) panel by PCR     Status: None   Collection Time: 05/08/23  6:09 PM   Specimen: Nasopharyngeal Swab; Respiratory  Result Value Ref Range Status   Adenovirus NOT DETECTED NOT DETECTED Final   Coronavirus 229E NOT DETECTED NOT DETECTED Final    Comment: (NOTE) The Coronavirus on the Respiratory Panel, DOES NOT test for the novel  Coronavirus (2019 nCoV)    Coronavirus HKU1 NOT  DETECTED NOT DETECTED Final   Coronavirus NL63 NOT DETECTED NOT DETECTED Final   Coronavirus OC43 NOT DETECTED NOT DETECTED Final   Metapneumovirus NOT DETECTED NOT DETECTED Final   Rhinovirus / Enterovirus NOT DETECTED NOT DETECTED Final   Influenza A NOT DETECTED NOT DETECTED Final   Influenza B NOT DETECTED NOT DETECTED Final   Parainfluenza Virus 1 NOT DETECTED NOT DETECTED Final   Parainfluenza Virus 2 NOT DETECTED NOT DETECTED Final   Parainfluenza  Virus 3 NOT DETECTED NOT DETECTED Final   Parainfluenza Virus 4 NOT DETECTED NOT DETECTED Final   Respiratory Syncytial Virus NOT DETECTED NOT DETECTED Final   Bordetella pertussis NOT DETECTED NOT DETECTED Final   Bordetella Parapertussis NOT DETECTED NOT DETECTED Final   Chlamydophila pneumoniae NOT DETECTED NOT DETECTED Final   Mycoplasma pneumoniae NOT DETECTED NOT DETECTED Final    Comment: Performed at [facility], [address], [place], [place] [address]  MRSA Next Gen by PCR, Nasal     Status: None   Collection Time: 05/08/23  6:13 PM   Specimen: Nasal Mucosa; Nasal Swab  Result Value Ref Range Status   MRSA by PCR Next Gen NOT DETECTED NOT DETECTED Final    Comment: (NOTE) The GeneXpert MRSA Assay (FDA approved for NASAL specimens only), is one component of a comprehensive MRSA colonization surveillance program. It is not intended to diagnose MRSA infection nor to guide or monitor treatment for MRSA infections. Test performance is not FDA approved in patients less than 31 years old. Performed at [facility], [address], [place], [place] [address]   Expectorated Sputum Assessment w Gram Stain, Rflx to Resp Cult     Status: None   Collection Time: 05/17/23  9:36 AM   Specimen: Expectorated Sputum  Result Value Ref Range Status   Specimen Description EXPECTORATED SPUTUM  Final   Special Requests NONE  Final   Sputum evaluation   Final    THIS SPECIMEN IS ACCEPTABLE FOR SPUTUM CULTURE Performed at [facility], [address], [place], [place] [address]    Report Status 05/18/2023 FINAL  Final  Culture, Respiratory w Gram Stain     Status: None (Preliminary result)   Collection Time: 05/17/23  9:36 AM  Result Value Ref Range Status   Specimen Description EXPECTORATED SPUTUM  Final   Special Requests NONE Reflexed from [id number]  Final   Gram Stain   Final    FEW WBC PRESENT,BOTH PMN AND MONONUCLEAR MODERATE GRAM NEGATIVE RODS FEW YEAST WITH PSEUDOHYPHAE Performed at [facility], [address], [place], [place] [address]    Culture PENDING  Incomplete   Report Status PENDING  Incomplete    Radiology Studies: No results found.  LOS: 10 days   Total time spent in review of labs and imaging, patient evaluation, formulation of plan, documentation and communication with family:  35 minutes  Lanae Boast, DO  [facility]  05/18/2023, 1:58 PM

## 2023-05-18 NOTE — Progress Notes (Signed)
 Physical Therapy Treatment Patient Details Name: Heather [name] MRN: [id number] DOB: May 03, 1940 Today's Date: 05/18/2023   History of Present Illness 83 y/o woman admitted from [facility] 3/4 with confusion, hypoglycemia, CAP and shock after wellfare check. PMhx: HFrEF, HTN, COPD, DM    PT Comments  Pt greeted supine in bed, pleasant and agreeable to PT session. She demonstrated improved functional mobility by requiring less physical assistance. Pt performed bed mobility with supervision. She completed 3 STS and engaged in ~23ft of side stepping using RW with CGA. Pt declined gait training or transfer to recliner chair this session d/t fatigue. Returned to supine and instructed pt on HEP for BLE. She demonstrated understanding and was provided with a handout. She is making great progress towards her acute PT goals. Patient will benefit from continued inpatient follow up therapy, <3 hours/day.     If plan is discharge home, recommend the following: A little help with walking and/or transfers;Assistance with cooking/housework;Assist for transportation;A little help with bathing/dressing/bathroom;Help with stairs or ramp for entrance   Can travel by private vehicle     Yes  Equipment Recommendations  Rolling walker (2 wheels);BSC/3in1    Recommendations for Other Services       Precautions / Restrictions Precautions Precautions: Fall Recall of Precautions/Restrictions: Intact Restrictions Weight Bearing Restrictions Per Provider Order: No     Mobility  Bed Mobility Overal bed mobility: Needs Assistance Bed Mobility: Supine to Sit, Sit to Supine     Supine to sit: HOB elevated, Supervision Sit to supine: Supervision   General bed mobility comments: Pt sat up on R side of bed with HOB elevated to ~20deg and use of bedrail. Returned to bed with HOB flat, increased time to bring BLE back into bed. Repositioned herself in the center with cueing.    Transfers Overall transfer level: Needs  assistance Equipment used: Rolling walker (2 wheels) Transfers: Sit to/from Stand Sit to Stand: Contact guard assist           General transfer comment: Pt stood from lowest bed height x3 reps. She pushed up with BUE from bed and brought hands onto RW. Achieved erect posture and demonstrated good eccentric control with sitting. Pt declined transfer to recliner chair d/t fatigue.    Ambulation/Gait Ambulation/Gait assistance: Contact guard assist Gait Distance (Feet): 2 Feet Assistive device: Rolling walker (2 wheels)         General Gait Details: Pt side stepped to the R along EOB. She took short steps with full foot clearence. VC on how to manuever RW as she initially began pushing it fwd instead of shifting it towards the R with her. Pt declined further gait attempts d/t fatigue.   Stairs             Wheelchair Mobility     Tilt Bed    Modified Rankin (Stroke Patients Only)       Balance Overall balance assessment: Mild deficits observed, not formally tested                                          Communication Communication Communication: No apparent difficulties  Cognition Arousal: Alert Behavior During Therapy: WFL for tasks assessed/performed   PT - Cognitive impairments: Orientation   Orientation impairments: Place, Time, Situation                   PT - Cognition  Comments: Pt pleasantly confused throughout session. Re-oriented at begining of session and reassessed with no carryover noted. Following commands: Intact      Cueing Cueing Techniques: Verbal cues, Gestural cues  Exercises General Exercises - Lower Extremity Ankle Circles/Pumps: Supine, Both, 10 reps, AROM Quad Sets: Supine, Both, 10 reps, Strengthening (with 10 second hold) Gluteal Sets: Supine, Both, 10 reps, Strengthening (with 10 second hold) Heel Slides: Supine, Both, 10 reps, AROM    General Comments General comments (skin integrity, edema, etc.):  VSS on RA. Instructed pt on HEP and provided her with a handout.  Encouraged pt to transfer to recliner chair over the week      Pertinent Vitals/Pain Pain Assessment Pain Assessment: No/denies pain    Home Living                          Prior Function            PT Goals (current goals can now be found in the care plan section) Progress towards PT goals: Progressing toward goals    Frequency    Min 2X/week      PT Plan      Co-evaluation              AM-PAC PT "6 Clicks" Mobility   Outcome Measure  Help needed turning from your back to your side while in a flat bed without using bedrails?: A Little Help needed moving from lying on your back to sitting on the side of a flat bed without using bedrails?: A Little Help needed moving to and from a bed to a chair (including a wheelchair)?: A Little Help needed standing up from a chair using your arms (e.g., wheelchair or bedside chair)?: A Little Help needed to walk in hospital room?: A Little Help needed climbing 3-5 steps with a railing? : A Lot 6 Click Score: 17    End of Session Equipment Utilized During Treatment: Gait belt Activity Tolerance: Patient tolerated treatment well;Patient limited by fatigue Patient left: in bed;with call bell/phone within reach;with bed alarm set Nurse Communication: Mobility status;Other (comment) (Need for purewick to be replaced) PT Visit Diagnosis: Other abnormalities of gait and mobility (R26.89);Difficulty in walking, not elsewhere classified (R26.2);Muscle weakness (generalized) (M62.81)     Time: 0981-1914 PT Time Calculation (min) (ACUTE ONLY): 24 min  Charges:    $Therapeutic Exercise: 8-22 mins $Therapeutic Activity: 8-22 mins PT General Charges $$ ACUTE PT VISIT: 1 Visit                     Cheri Guppy, PT, DPT Acute Rehabilitation Services Office: [phone] Secure Chat Preferred  Richardson Chiquito 05/18/2023, 3:24 PM

## 2023-05-18 NOTE — Progress Notes (Signed)
 Advanced Heart Failure Rounding Note  Cardiologist: Vishnu Norton Pastel, MD  Chief Complaint: systolic heart failure  Subjective:    More alert/clear today.   Feels better. Breathing improved. 1.5L in UOP yesterday w/ IV Lasix.  SCr better, 2.35>>2.19 today.   Only complaint Is feeling weak. Planning SNF at d/c. Son present at bedside.    Objective:   Weight Range: 71.3 kg Body mass index is 28.75 kg/m.   Vital Signs:   Temp:  [98 F (36.7 C)-98.9 F (37.2 C)] 98.9 F (37.2 C) (03/14 1112) Pulse Rate:  [74-85] 85 (03/14 1112) Resp:  [16-20] 16 (03/14 1112) BP: (109-133)/(53-62) 133/62 (03/14 1112) SpO2:  [91 %-98 %] 91 % (03/14 1112) Weight:  [71.3 kg] 71.3 kg (03/14 0347) Last BM Date : 05/15/23  Weight change: Filed Weights   05/16/23 0500 05/17/23 0451 05/18/23 0347  Weight: 73.6 kg 74.2 kg 71.3 kg    Intake/Output:   Intake/Output Summary (Last 24 hours) at 05/18/2023 1149 Last data filed at 05/18/2023 0400 Gross per 24 hour  Intake 240 ml  Output 1450 ml  Net -1210 ml      Physical Exam   General:  fatigued, frail appearing elderly female No respiratory difficulty HEENT: normal Neck: supple. JVD 8-9 cm. Carotids 2+ bilat; no bruits. No lymphadenopathy or thyromegaly appreciated. Cor: PMI nondisplaced. Regular rate & rhythm. No rubs, gallops or murmurs. Lungs: clear Abdomen: soft, nontender, nondistended. No hepatosplenomegaly. No bruits or masses. Good bowel sounds. Extremities: no cyanosis, clubbing, rash, edema Neuro: alert & oriented x 3, cranial nerves grossly intact. moves all 4 extremities w/o difficulty. Affect pleasant.  Telemetry   NSR 90s   EKG    N/A   Labs    CBC Recent Labs    05/17/23 0502 05/18/23 0546  WBC 8.5 9.3  HGB 9.6* 10.1*  HCT 28.8* 30.6*  MCV 97.3 98.1  PLT 85* 116*   Basic Metabolic Panel Recent Labs    82/95/62 0441 05/17/23 0502 05/18/23 0546  NA 144 144 140  K 4.1 3.4* 3.6  CL 112* 106 104   CO2 23 29 26   GLUCOSE 100* 83 102*  BUN 95* 88* 78*  CREATININE 2.27* 2.35* 2.19*  CALCIUM 8.6* 8.5* 8.5*  MG 2.3  --   --    Liver Function Tests Recent Labs    05/18/23 0546  AST 53*  ALT 300*  ALKPHOS 105  BILITOT 2.3*  PROT 5.2*  ALBUMIN 2.6*    No results for input(s): "LIPASE", "AMYLASE" in the last 72 hours. Cardiac Enzymes No results for input(s): "CKTOTAL", "CKMB", "CKMBINDEX", "TROPONINI" in the last 72 hours.  BNP: BNP (last 3 results) Recent Labs    04/09/23 1144 05/08/23 2255 05/15/23 0513  BNP >4,500.0* >4,500.0* >4,500.0*    ProBNP (last 3 results) No results for input(s): "PROBNP" in the last 8760 hours.   D-Dimer No results for input(s): "DDIMER" in the last 72 hours. Hemoglobin A1C No results for input(s): "HGBA1C" in the last 72 hours. Fasting Lipid Panel No results for input(s): "CHOL", "HDL", "LDLCALC", "TRIG", "CHOLHDL", "LDLDIRECT" in the last 72 hours. Thyroid Function Tests No results for input(s): "TSH", "T4TOTAL", "T3FREE", "THYROIDAB" in the last 72 hours.  Invalid input(s): "FREET3"  Other results:   Imaging    No results found.    Medications:     Scheduled Medications:  arformoterol  15 mcg Nebulization BID   budesonide (PULMICORT) nebulizer solution  0.5 mg Nebulization BID   famotidine  10 mg Oral Daily   feeding supplement  237 mL Oral BID BM   melatonin  3 mg Oral QHS   mouth rinse  15 mL Mouth Rinse 4 times per day   polyethylene glycol  17 g Oral Daily   revefenacin  175 mcg Nebulization Daily   venlafaxine  37.5 mg Oral BID WC    Infusions:    PRN Medications: docusate, ipratropium-albuterol, mouth rinse, phenol, white petrolatum    Patient Profile  83 y/o female w/ 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 Septic shock 2/2 PNA.   Assessment/Plan  1. Acute on Chronic Systolic Heart Failure Nonischemic  cardiomyopathy. Cath in 8/23 with no significant CAD, CI 2.24. ?LBBB cardiomyopathy versus familial versus prior myocarditis. Echo in 12/23 with EF < 20%, moderately reduced RV function, D-shaped septum, mild-moderate MR. She was admitted 1/24 with volume overload, empirically started on milrinone 0.25 and Lasix gtt.  Cardiac MRI showed LV severely dilated with EF 16% and septal-lateral dyssynchrony, RV moderately dilated with EF 23%, mid-apical inferolateral LGE in prior infarction pattern. However, CAD does not explain the extent of her cardiomyopathy based on cath. Echo in 5/23 showed EF <20% with moderate LV enlargement, septal-lateral dyssynchrony, moderate RV dysfunction, PASP 65 mmHg. Underwent CRT-P 5/24.  Echo 9/24 showed that EF is still 20-25%. She was noted to only be BiV pacing 87% of the time, frequent PVCs noted on ECG. Concern that PVCs were decreasing her BiV % and worsen LV function. - Admitted w/ mixed septic and cardiogenic shock. Overall improved and off NE and Milrinone. Good response to IV Lasix. Volume status much improved. SCr trending down  - transition back to PO diuretics today, torsemide 60 mg daily  - GDMT limited by elevated SCr/AKI    2. Acute Hypoxic Respiratory Failure - much improved  - completed doxy + ceftriaxone for PNA - inhalers for COPD per IM - diuretics per above     3. AKI on CKD IIIb - baseline SCr ~2.2 - SCr peaked to 4.87 this admit, in setting of shock - improving, SCr 2.19 today  - follow BMP    4. Shock Liver - improving - AST 2,916>>236>>53 - ALT 2,618>>980 >>300     5. Thrombocytopenia - Plts trending back up 33K -->58->85>>116K  - hematology has seen, suspect 2/2 hepatic shock/ critical illness    6. Hypernatremia -Resolved.   7. Hypokalemia - resolved, K 3.6 - follow BMP   8. GOC: given multiple co morbidities, frailty and declining condition, recommend palliative care for GOC discussion    9. Deconditioning - needs SNF for  rehab once discharged    Length of Stay: [phone] Delmer Islam  05/18/2023, 11:49 AM  Advanced Heart Failure Team Pager [phone] (M-F; 7a - 5p)  Please contact [facility] for night-coverage after hours (5p -7a ) and weekends on [email or web address]

## 2023-05-18 NOTE — Plan of Care (Signed)
  Problem: Health Behavior/Discharge Planning: Goal: Ability to manage health-related needs will improve Outcome: Progressing   Problem: Clinical Measurements: Goal: Ability to maintain clinical measurements within normal limits will improve Outcome: Progressing Goal: Respiratory complications will improve Outcome: Progressing Goal: Cardiovascular complication will be avoided Outcome: Progressing   Problem: Nutrition: Goal: Adequate nutrition will be maintained Outcome: Progressing   Problem: Elimination: Goal: Will not experience complications related to urinary retention Outcome: Progressing   Problem: Safety: Goal: Ability to remain free from injury will improve Outcome: Progressing

## 2023-05-18 NOTE — Plan of Care (Signed)
 Palliative-   Attempted to see patient- she was receiving personal care. Will try again at a later time.   Ocie Bob, AGNP-C Palliative Medicine  No Charge

## 2023-05-18 NOTE — TOC Progression Note (Signed)
 Transition of Care [facility]) - Progression Note    Patient Details  Name: Heather [name] MRN: [id number] Date of Birth: 1940/08/21  Transition of Care [facility]) CM/SW Contact  Nicanor Bake Phone Number: [phone] 05/18/2023, 9:48 AM  Clinical Narrative:   [facility] CSW called and spoke with Tammy who assisted in starting the pts insurance auth. Insurance Berkley Harvey is currently pending.   TOC will continue following.     Expected Discharge Plan: Skilled Nursing Facility Barriers to Discharge: Continued Medical Work up, SNF Pending bed offer  Expected Discharge Plan and Services In-house Referral: Clinical Social Work   Post Acute Care Choice: Skilled Nursing Facility Living arrangements for the past 2 months: Apartment                                       Social Determinants of Health (SDOH) Interventions SDOH Screenings   Food Insecurity: Patient Declined (05/09/2023)  Housing: Patient Declined (05/09/2023)  Transportation Needs: Patient Unable To Answer (05/09/2023)  Utilities: Patient Unable To Answer (05/09/2023)  Alcohol Screen: Low Risk  (11/27/2022)  Depression (PHQ2-9): Low Risk  (05/02/2023)  Financial Resource Strain: Medium Risk (11/27/2022)  Physical Activity: Insufficiently Active (11/27/2022)  Social Connections: Patient Unable To Answer (05/09/2023)  Stress: Stress Concern Present (11/27/2022)  Tobacco Use: Medium Risk (05/08/2023)  Health Literacy: Adequate Health Literacy (11/09/2022)    Readmission Risk Interventions     No data to display

## 2023-05-18 NOTE — Progress Notes (Signed)
 Occupational Therapy Treatment Patient Details Name: Heather [name] MRN: [id number] DOB: 01-Jan-1941 Today's Date: 05/18/2023   History of present illness 83 y/o woman admitted from [facility] 3/4 with confusion, hypoglycemia, CAP and shock after wellfare check. PMhx: HFrEF, HTN, COPD, DM   OT comments  Pt. Seen for skilled OT treatment session.  Pt. Able to complete bed mobility to eob and back to bed with S and cues for hand placement and use of bed rail.  Un supported sitting for grooming task with no LOB noted.  Reports she has A/E for LB dressing at home that she uses regularly (sock aide).  Cont. With acute OT POC.        If plan is discharge home, recommend the following:  A lot of help with bathing/dressing/bathroom;Assistance with cooking/housework;Assistance with feeding;Help with stairs or ramp for entrance;Assist for transportation;Other (comment);Supervision due to cognitive status;A little help with walking and/or transfers   Equipment Recommendations  Wheelchair cushion (measurements OT);Wheelchair (measurements OT)    Recommendations for Other Services      Precautions / Restrictions Precautions Precautions: Fall       Mobility Bed Mobility Overal bed mobility: Needs Assistance Bed Mobility: Rolling, Sidelying to Sit, Sit to Supine Rolling: Supervision, Used rails Sidelying to sit: Supervision   Sit to supine: Supervision   General bed mobility comments: pt. with HOB elevated and use of R bed rail for rolling to R side and able to push into sitting without any physical assistance. for back to bed, pt. able to use BUEs to guide BLEs into bed then lay back in supine without physical assistance    Transfers                   General transfer comment: able to scoot x1 towards hob prior to laying down     Balance                                           ADL either performed or assessed with clinical judgement   ADL       Grooming: Oral  care;Sitting;Set up Grooming Details (indicate cue type and reason): un supported sitting eob no lob noted. able to have BUEs in use without any support needed             Lower Body Dressing: Sitting/lateral leans;With adaptive equipment Lower Body Dressing Details (indicate cue type and reason): pt. reports she uses a sock aide at home for LB dressing of socks                    Extremity/Trunk Assessment              Vision       Perception     Praxis     Communication     Cognition Arousal: Alert Behavior During Therapy: [facility] for tasks assessed/performed                                          Cueing      Exercises      Shoulder Instructions       General Comments      Pertinent Vitals/ Pain       Pain Assessment Pain Assessment: No/denies pain  Home Living  Prior Functioning/Environment              Frequency  Min 2X/week        Progress Toward Goals  OT Goals(current goals can now be found in the care plan section)  Progress towards OT goals: Progressing toward goals     Plan      Co-evaluation                 AM-PAC OT "6 Clicks" Daily Activity     Outcome Measure   Help from another person eating meals?: Total Help from another person taking care of personal grooming?: A Little Help from another person toileting, which includes using toliet, bedpan, or urinal?: A Lot Help from another person bathing (including washing, rinsing, drying)?: A Lot Help from another person to put on and taking off regular upper body clothing?: A Little Help from another person to put on and taking off regular lower body clothing?: A Lot 6 Click Score: 13    End of Session    OT Visit Diagnosis: Unsteadiness on feet (R26.81);Other abnormalities of gait and mobility (R26.89);Muscle weakness (generalized) (M62.81);Other symptoms and signs involving  cognitive function   Activity Tolerance Patient tolerated treatment well   Patient Left in bed;with call bell/phone within reach;with bed alarm set   Nurse Communication Other (comment) (secure chat with RN session details at end of session)        Time: 4098-1191 OT Time Calculation (min): 12 min  Charges: OT General Charges $OT Visit: 1 Visit OT Treatments $Self Care/Home Management : 8-22 mins  Boneta Lucks, COTA/L Acute Rehabilitation [phone]   Alessandra Bevels Lorraine-COTA/L 05/18/2023, 11:01 AM

## 2023-05-19 DIAGNOSIS — A419 Sepsis, unspecified organism: Secondary | ICD-10-CM | POA: Diagnosis not present

## 2023-05-19 LAB — GLUCOSE, CAPILLARY
Glucose-Capillary: 98 mg/dL (ref 70–99)
Glucose-Capillary: 109 mg/dL — ABNORMAL HIGH (ref 70–99)
Glucose-Capillary: 117 mg/dL — ABNORMAL HIGH (ref 70–99)
Glucose-Capillary: 137 mg/dL — ABNORMAL HIGH (ref 70–99)

## 2023-05-19 LAB — ERYTHROPOIETIN: Erythropoietin: 27.4 m[IU]/mL — ABNORMAL HIGH (ref 2.6–18.5)

## 2023-05-19 MED ORDER — FUROSEMIDE 10 MG/ML IJ SOLN
60.0000 mg | Freq: Once | INTRAMUSCULAR | Status: AC
Start: 2023-05-19 — End: 2023-05-19
  Administered 2023-05-19: 60 mg via INTRAVENOUS
  Filled 2023-05-19: qty 6

## 2023-05-19 MED ORDER — TORSEMIDE 20 MG PO TABS
60.0000 mg | ORAL_TABLET | Freq: Every day | ORAL | Status: DC
  Administered 2023-05-20 – 2023-05-21 (×2): 60 mg via ORAL
  Filled 2023-05-19 (×2): qty 3

## 2023-05-19 MED ORDER — POLYVINYL ALCOHOL 1.4 % OP SOLN
1.0000 [drp] | Freq: Two times a day (BID) | OPHTHALMIC | Status: DC
  Administered 2023-05-19 – 2023-05-21 (×5): 1 [drp] via OPHTHALMIC
  Filled 2023-05-19 (×2): qty 15

## 2023-05-19 MED ORDER — ALLOPURINOL 300 MG PO TABS
150.0000 mg | ORAL_TABLET | Freq: Every day | ORAL | Status: DC
  Administered 2023-05-19 – 2023-05-21 (×3): 150 mg via ORAL
  Filled 2023-05-19 (×3): qty 1

## 2023-05-19 NOTE — Progress Notes (Signed)
 PROGRESS NOTE Heather [name]  YQM:[id number] DOB: 1940-05-25 DOA: 05/08/2023 PCP: [name], MD    Brief Narrative/Hospital Course: 83 year old female with multiple comorbidities including CKD IV B/L creat ~2-2.4,, T2DM chronic systolic CHF with EF 20-25% BiV PM in place since 07/2022, chronic pain syndrome and other comorbidities who on holdby police 3/4 found to have altered mental status last known normal the day prior, was hypoglycemic at 40 and brought to the ED which she was profoundly acidotic pH 6.69 pCO2 54, and found to have acute kidney failure, significant transaminitis lactic acidosis leukocytosis fever concerning for shock, and admitted to ICU on 05/08/23-on vasopressors bicarb drip broad-spectrum antibiotics and IV fluids. -Vasopressors subsequently weaned off, core track placed 3/7 for nutrition 3/10: Transferred to floor under TRH service  Significant imaging/procedure: TH : No acute CT finding. Age related volume loss. Mild chronic small-vessel ischemic change of the white matter CT CAP: Dependent atelectasis/consolidation in both lower lobes. 7 mm right middle lobe nodule. Right Spigelian hernia containing only fat.  CXR: Cardiomegaly. Worsening airspace disease at the left > right lung base which may be due to atelectasis or pneumonia. Possible small left effusion. ECHO: G HKN worse apex and septum with akinesis. LVEF 25% with regional wall motion abnormalities. Severely enlarged LV. G I DD. RV function normal.  Consultation: PCCM-admitting Cardiology-[facility] team Hematology oncology Palliative care    Subjective: Seen and examined this morning  She is alert awake fairly oriented.  Last night IV line was removed  No complaints still has leg swelling but improved Her weight is further down to 154 pound  Assessment and Plan: Principal Problem:   Acute sepsis [facility]) Active Problems:   Cardiogenic shock (HCC)   Malnutrition of moderate degree   Shock-likely  combination of sepsis and cardiogenic Severe acidosis Elevated troponin from demand ischemia: Patient managed IV antibiotics completed 3/10>Initially needed pressors, off pressors since 3/8 and BP soft. Blood culture 3/4- NGTD. RVP negative. MRSA PCR negative.  Afebrile at this time.  She is clinically stable   Acute on chronic systolic CHF-cardiogenic shock NICM with EF 25%-previously 20 to 25% September 2024 BiV PCM in place since 07/2022 Elevated troponin likely demand ischemia: Current echo shows EF 25% with RWMA,G HKN, enlarged LV.  Diuretics/EDMD limited by hypotension.  Diuretics managed by advanced heart failure team. Continue to hold hold home bisoprolol, hydralazine,Imdur, and amiodarone for now and will need to reassess as outpatient Appreciate cardiology input, palliative care has been consulted awaiting input Cont to monitor daily I/O,weight, electrolytes and net balance as below and cont on  salt/fluid restricted diet and monitor in tele. Net IO Since Admission: -2,858.4 mL [05/19/23 1122] - if correct  Acute hypoxic respiratory failure CAP COPD history: Completed antibiotics 3/10.  Doing well on room air. Continue budesonide [name] (on Trelegy at home)   AKI on CKD stage IV: Baseline creatinine 2.1-2.4.  Renal function remains stable repeat in 1 week Recent Labs    05/09/23 0428 05/10/23 0344 05/11/23 0516 05/12/23 0310 05/13/23 0458 05/14/23 0531 05/15/23 0513 05/16/23 0441 05/17/23 0502 05/18/23 0546  BUN 119* 119* 116* 114* 98* 93* 96* 95* 88* 78*  CREATININE 4.84* 4.87* 4.24* 3.72* 2.88* 2.50* 2.51* 2.27* 2.35* 2.19*  CO2 22 23 25 24 24  21* 21* 23 29 26   K 4.0 3.3* 3.5 3.4* 3.6 4.2 4.2 4.1 3.4* 3.6    Acute metabolic encephalopathy-: on presentation confused.  Fairly oriented communicative interactive. Awaiting placement  Moderate malnutrition/dysphagia: Off feeding tube.  Augment diet as tolerated dietitian following as below  Nutrition  Problem: Moderate Malnutrition Etiology: chronic illness (COPD, CHF) Signs/Symptoms: severe muscle depletion, mild fat depletion Interventions: Refer to RD note for recommendations   HLD: Holding statin due to transaminitis.  Transaminitis Elevated INR/coagulopathy: Likely shock liver.  Monitor LFTs-appears to be downtrending.  Repeat in 1 week  Chronic anemia: Remains stable transfuse if less than 7    Acute thrombocytopenia: -trending up, follow-up in 1 week  Hypernatremia: Likely from diuresis.  Sodium has stabilized  Hypokalemia Stable  Type 2 diabetes mellitus with hypoglycemia: - PTA on Jardiance and currently on hold.  Blood sugar currently well-controlled on SSI. Recent Labs  Lab 05/17/23 2058 05/18/23 0720 05/18/23 1107 05/18/23 2115 05/19/23 0737  GLUCAP 163* 94 140* 139* 98    7 mm RML nodule: Needs follow-up in 6 to 12 months and pulmonary or pcp to arrange.  Blood sugar controlled  Chronic back pain Lumbar radiculitis Anxiety: Continue Effexor .  Hold narcotics  Goals of care: Patient with complex comorbidities.  Advanced heart failure team following, suggest palliative care consultation, currently full code, palliative care has been consulted  DVT prophylaxis: Place TED hose Start: 05/17/23 0930 SCDs Start: 05/08/23 1806 Code Status:   Code Status: Full Code Family Communication: plan of care discussed with patient/none at bedside Patient status is: Remains hospitalized because of severity of illness Level of care: Progressive   Dispo: The patient is from: lives alone            Anticipated disposition: SNF bed placement pending. pending palliative care meeting  Objective: Vitals last 24 hrs: Vitals:   05/18/23 1112 05/18/23 2043 05/19/23 0421 05/19/23 0740  BP: 133/62 (!) 116/48 120/76   Pulse: 85 73 73   Resp: 16 15 17    Temp: 98.9 F (37.2 C) 99.4 F (37.4 C) 98.8 F (37.1 C)   TempSrc: Oral Oral Oral   SpO2: 91% 91% 91% 90%   Weight:   70.1 kg   Height:       Weight change: -1.2 kg  Physical Examination: General exam: alert awake, oriented to self place people daily, older than stated age HEENT:Oral mucosa moist, Ear/Nose WNL grossly Respiratory system: Bilaterally clear BS,no use of accessory muscle Cardiovascular system: S1 & S2 +, No JVD. Gastrointestinal system: Abdomen soft,NT,ND, BS+ Nervous System: Alert, awake, moving all extremities,and following commands. Extremities: LE edema neg,distal peripheral pulses palpable and warm.  Skin: No rashes,no icterus. MSK: Normal muscle bulk,tone, power   Medications reviewed:  Scheduled Meds:  arformoterol  15 mcg Nebulization BID   budesonide (PULMICORT) nebulizer solution  0.5 mg Nebulization BID   famotidine  10 mg Oral Daily   feeding supplement  237 mL Oral BID BM   melatonin  3 mg Oral QHS   mouth rinse  15 mL Mouth Rinse 4 times per day   polyethylene glycol  17 g Oral Daily   revefenacin  175 mcg Nebulization Daily   venlafaxine  37.5 mg Oral BID WC   Continuous Infusions:    Diet Order             Diet regular Room service appropriate? Yes with Assist; Fluid consistency: Thin  Diet effective now                  Intake/Output Summary (Last 24 hours) at 05/19/2023 1122 Last data filed at 05/19/2023 0424 Gross per 24 hour  Intake --  Output 750 ml  Net -750  ml  Net IO Since Admission: -2,858.4 mL [05/19/23 1122]  Wt Readings from Last 3 Encounters:  05/19/23 70.1 kg  05/02/23 69.4 kg  04/09/23 66 kg    Data Reviewed: I have personally reviewed following labs and imaging studies CBC: Recent Labs  Lab 05/14/23 0531 05/15/23 0513 05/16/23 0441 05/17/23 0502 05/18/23 0546  WBC 10.1  9.9 9.3 7.5 8.5 9.3  NEUTROABS 8.2*  --   --   --   --   HGB 10.8*  10.6* 10.3* 10.2* 9.6* 10.1*  HCT 32.9*  32.4* 32.2* 30.4* 28.8* 30.6*  MCV 98.2  97.9 99.4 97.4 97.3 98.1  PLT 34*  33* 51* 58* 85* 116*   Basic Metabolic Panel:   Recent Labs  Lab 05/13/23 0458 05/14/23 0531 05/15/23 0513 05/16/23 0441 05/17/23 0502 05/18/23 0546  NA 152* 152* 146* 144 144 140  K 3.6 4.2 4.2 4.1 3.4* 3.6  CL 118* 119* 116* 112* 106 104  CO2 24 21* 21* 23 29 26   GLUCOSE 130* 96 149* 100* 83 102*  BUN 98* 93* 96* 95* 88* 78*  CREATININE 2.88* 2.50* 2.51* 2.27* 2.35* 2.19*  CALCIUM 8.7* 9.0 8.3* 8.6* 8.5* 8.5*  MG 2.3 2.3  --  2.3  --   --    GFR: Estimated Creatinine Clearance: 18.5 mL/min (A) (by C-G formula based on SCr of 2.19 mg/dL (H)). Liver Function Tests:  Recent Labs  Lab 05/14/23 0531 05/18/23 0546  AST 236* 53*  ALT 980* 300*  ALKPHOS 131* 105  BILITOT 3.0* 2.3*  PROT 5.3* 5.2*  ALBUMIN 2.7* 2.6*  No results for input(s): "LIPASE", "AMYLASE" in the last 168 hours.  No results for input(s): "AMMONIA" in the last 168 hours. Coagulation Profile:  No results for input(s): "INR", "PROTIME" in the last 168 hours. No results for input(s): "PROBNP" in the last 168 hours.  No results for input(s): "HGBA1C" in the last 72 hours. Recent Labs  Lab 05/17/23 2058 05/18/23 0720 05/18/23 1107 05/18/23 2115 05/19/23 0737  GLUCAP 163* 94 140* 139* 98   No results for input(s): "PROCALCITON", "LATICACIDVEN" in the last 168 hours.  Recent Results (from the past 240 hours)  Expectorated Sputum Assessment w Gram Stain, Rflx to Resp Cult     Status: None   Collection Time: 05/17/23  9:36 AM   Specimen: Expectorated Sputum  Result Value Ref Range Status   Specimen Description EXPECTORATED SPUTUM  Final   Special Requests NONE  Final   Sputum evaluation   Final    THIS SPECIMEN IS ACCEPTABLE FOR SPUTUM CULTURE Performed at [facility], [address] [place], [place] [address]    Report Status 05/18/2023 FINAL  Final  Culture, Respiratory w Gram Stain     Status: None (Preliminary result)   Collection Time: 05/17/23  9:36 AM  Result Value Ref Range Status   Specimen Description EXPECTORATED SPUTUM  Final    Special Requests NONE Reflexed from [id number]  Final   Gram Stain   Final    FEW WBC PRESENT,BOTH PMN AND MONONUCLEAR MODERATE GRAM NEGATIVE RODS FEW YEAST WITH PSEUDOHYPHAE Performed at [facility], [address], [place], [place] [address]    Culture PENDING  Incomplete   Report Status PENDING  Incomplete    Radiology Studies: No results found.  LOS: 11 days   Total time spent in review of labs and imaging, patient evaluation, formulation of plan, documentation and communication with family:  35 minutes  Dayna Barker  Collin Hendley, DO  [facility]  05/19/2023, 11:22 AM

## 2023-05-19 NOTE — Progress Notes (Signed)
 Advanced Heart Failure Rounding Note  Cardiologist: Vishnu P Mallipeddi, MD  Chief Complaint: systolic heart failure    Patient Profile  83 y/o female w/ 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 Septic shock 2/2 PNA. Required inotropes, NE and milrinone Freq PVCs limiting BiV pacing For SNF at discharge   Subjective:   Feels better but still sob   Objective:   Weight Range: 70.1 kg Body mass index is 28.27 kg/m.   Vital Signs:   Temp:  [98.8 F (37.1 C)-99.4 F (37.4 C)] 98.8 F (37.1 C) (03/15 0421) Pulse Rate:  [73-85] 73 (03/15 0421) Resp:  [15-17] 17 (03/15 0421) BP: (116-133)/(48-76) 120/76 (03/15 0421) SpO2:  [90 %-91 %] 90 % (03/15 0740) Weight:  [70.1 kg] 70.1 kg (03/15 0421) Last BM Date : 05/18/23  Weight change: Filed Weights   05/17/23 0451 05/18/23 0347 05/19/23 0421  Weight: 74.2 kg 71.3 kg 70.1 kg    Intake/Output:   Intake/Output Summary (Last 24 hours) at 05/19/2023 0910 Last data filed at 05/19/2023 0424 Gross per 24 hour  Intake --  Output 750 ml  Net -750 ml      Physical Exam  Well developed and nourished in no acute distress HENT normal Neck supple with JVP-  10 Crackles and ronchi Regular rate and rhythm, no murmurs or gallops Abd-soft with active BS No Clubbing cyanosis 1+ edema Skin-warm and dry A & Oriented  Grossly normal sensory and motor function     Telemetry   P-synchronous/ AV  pacing    EKG    N/A   Labs    CBC Recent Labs    05/17/23 0502 05/18/23 0546  WBC 8.5 9.3  HGB 9.6* 10.1*  HCT 28.8* 30.6*  MCV 97.3 98.1  PLT 85* 116*   Basic Metabolic Panel Recent Labs    08/65/78 0502 05/18/23 0546  NA 144 140  K 3.4* 3.6  CL 106 104  CO2 29 26  GLUCOSE 83 102*  BUN 88* 78*  CREATININE 2.35* 2.19*  CALCIUM 8.5* 8.5*   Liver Function Tests Recent Labs    05/18/23 0546  AST 53*  ALT 300*  ALKPHOS 105   BILITOT 2.3*  PROT 5.2*  ALBUMIN 2.6*    No results for input(s): "LIPASE", "AMYLASE" in the last 72 hours. Cardiac Enzymes No results for input(s): "CKTOTAL", "CKMB", "CKMBINDEX", "TROPONINI" in the last 72 hours.  BNP: BNP (last 3 results) Recent Labs    04/09/23 1144 05/08/23 2255 05/15/23 0513  BNP >4,500.0* >4,500.0* >4,500.0*       Imaging       Medications:     Scheduled Medications:  arformoterol  15 mcg Nebulization BID   budesonide (PULMICORT) nebulizer solution  0.5 mg Nebulization BID   famotidine  10 mg Oral Daily   feeding supplement  237 mL Oral BID BM   melatonin  3 mg Oral QHS   mouth rinse  15 mL Mouth Rinse 4 times per day   polyethylene glycol  17 g Oral Daily   revefenacin  175 mcg Nebulization Daily   venlafaxine  37.5 mg Oral BID WC      Assessment/Plan  1. Acute on Chronic Systolic Heart Failure Nonischemic cardiomyopathy. Cath in 8/23 with no significant CAD, CI 2.24. ?LBBB cardiomyopathy versus familial versus prior myocarditis. Echo in 12/23 with EF < 20%, moderately reduced RV function, D-shaped septum, mild-moderate MR. She  was admitted 1/24 with volume overload, empirically started on milrinone 0.25 and Lasix gtt.  Cardiac MRI showed LV severely dilated with EF 16% and septal-lateral dyssynchrony, RV moderately dilated with EF 23%, mid-apical inferolateral LGE in prior infarction pattern. However, CAD does not explain the extent of her cardiomyopathy based on cath. Echo in 5/23 showed EF <20% with moderate LV enlargement, septal-lateral dyssynchrony, moderate RV dysfunction, PASP 65 mmHg. Underwent CRT-P 5/24.  Echo 9/24 showed that EF is still 20-25%. She was noted to only be BiV pacing 87% of the time, frequent PVCs noted on ECG. Concern that PVCs were decreasing her BiV % and worsen LV function. - Admitted w/ mixed septic and cardiogenic shock. Overall improved and off NE and Milrinone. Good response to IV Lasix. Volume status much  improved. SCr trending down  - transition back to PO diuretics today, torsemide 60 mg daily  - GDMT limited by elevated SCr/AKI    2. Acute Hypoxic Respiratory Failure - much improved  - completed doxy + ceftriaxone for PNA - inhalers for COPD per IM - diuretics per above     3. AKI on CKD IIIb - baseline SCr ~2.2 - SCr peaked to 4.87 this admit, in setting of shock - improving, SCr 2.19 today  - follow BMP    4. Shock Liver - improving - AST 2,916>>236>>53 - ALT 2,618>>980 >>300     5. Thrombocytopenia - Plts trending back up 33K -->58->85>>116K  - hematology has seen, suspect 2/2 hepatic shock/ critical illness    6. Hypernatremia -Resolved.   7. Hypokalemia - resolved, K 3.6 - follow BMP   8. GOC: given multiple co morbidities, frailty and declining condition, recommend palliative care for GOC discussion    9. Deconditioning - needs SNF for rehab once discharged    May amiodarone for PVC suppression once we know liver function, but currently pretty quiet Begin low dose bisoprolol IV diuretic today and then torsemide in the am  Will recheck renal function but near her baseline which will limit GDMT      Length of Stay: 11  Sherryl Manges, MD  05/19/2023, 9:10 AM

## 2023-05-19 NOTE — Progress Notes (Signed)
 Patient pulled out her IV. Informed Dr. Loney Loh who said patient was ok to go without an IV till day shift. Day shift can decide if patient needs IV access.

## 2023-05-20 DIAGNOSIS — R570 Cardiogenic shock: Secondary | ICD-10-CM | POA: Diagnosis not present

## 2023-05-20 DIAGNOSIS — K7200 Acute and subacute hepatic failure without coma: Secondary | ICD-10-CM | POA: Diagnosis not present

## 2023-05-20 DIAGNOSIS — E876 Hypokalemia: Secondary | ICD-10-CM

## 2023-05-20 DIAGNOSIS — E440 Moderate protein-calorie malnutrition: Secondary | ICD-10-CM

## 2023-05-20 DIAGNOSIS — I5022 Chronic systolic (congestive) heart failure: Secondary | ICD-10-CM

## 2023-05-20 DIAGNOSIS — R531 Weakness: Secondary | ICD-10-CM

## 2023-05-20 DIAGNOSIS — A419 Sepsis, unspecified organism: Secondary | ICD-10-CM | POA: Diagnosis not present

## 2023-05-20 LAB — COMPREHENSIVE METABOLIC PANEL
ALT: 204 U/L — ABNORMAL HIGH (ref 0–44)
AST: 47 U/L — ABNORMAL HIGH (ref 15–41)
Albumin: 2.9 g/dL — ABNORMAL LOW (ref 3.5–5.0)
Alkaline Phosphatase: 98 U/L (ref 38–126)
Anion gap: 13 (ref 5–15)
BUN: 54 mg/dL — ABNORMAL HIGH (ref 8–23)
CO2: 25 mmol/L (ref 22–32)
Calcium: 8.8 mg/dL — ABNORMAL LOW (ref 8.9–10.3)
Chloride: 102 mmol/L (ref 98–111)
Creatinine, Ser: 1.99 mg/dL — ABNORMAL HIGH (ref 0.44–1.00)
GFR, Estimated: 25 mL/min — ABNORMAL LOW (ref >60)
Glucose, Bld: 90 mg/dL (ref 70–99)
Potassium: 3.9 mmol/L (ref 3.5–5.1)
Sodium: 140 mmol/L (ref 135–145)
Total Bilirubin: 2.1 mg/dL — ABNORMAL HIGH (ref 0.0–1.2)
Total Protein: 5.6 g/dL — ABNORMAL LOW (ref 6.5–8.1)

## 2023-05-20 LAB — CULTURE, RESPIRATORY W GRAM STAIN

## 2023-05-20 LAB — GLUCOSE, CAPILLARY: Glucose-Capillary: 92 mg/dL (ref 70–99)

## 2023-05-20 MED ORDER — ACETAMINOPHEN 500 MG PO TABS
500.0000 mg | ORAL_TABLET | Freq: Once | ORAL | Status: AC | PRN
  Administered 2023-05-20: 500 mg via ORAL
  Filled 2023-05-20: qty 1

## 2023-05-20 MED ORDER — GUAIFENESIN-DM 100-10 MG/5ML PO SYRP
5.0000 mL | ORAL_SOLUTION | ORAL | Status: DC | PRN
Start: 2023-05-20 — End: 2023-05-21
  Administered 2023-05-20: 5 mL via ORAL
  Filled 2023-05-20: qty 5

## 2023-05-20 NOTE — Plan of Care (Signed)
  Problem: Education: Goal: Knowledge of General Education information will improve Description: Including pain rating scale, medication(s)/side effects and non-pharmacologic comfort measures Outcome: Progressing   Problem: Health Behavior/Discharge Planning: Goal: Ability to manage health-related needs will improve Outcome: Not Progressing   Problem: Activity: Goal: Risk for activity intolerance will decrease Outcome: Not Progressing   Problem: Pain Managment: Goal: General experience of comfort will improve and/or be controlled Outcome: Progressing   Problem: Safety: Goal: Ability to remain free from injury will improve Outcome: Progressing

## 2023-05-20 NOTE — Progress Notes (Signed)
 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 -Vasopressors subsequently weaned off, core track placed 3/7 for nutrition 3/10: Transferred to floor under TRH service  Significant imaging/procedure: TH : No acute CT finding. Age related volume loss. Mild chronic small-vessel ischemic change of the white matter CT CAP: Dependent atelectasis/consolidation in both lower lobes. 7 mm right middle lobe nodule. Right Spigelian hernia containing only fat.  CXR: Cardiomegaly. Worsening airspace disease at the left > right lung base which may be due to atelectasis or pneumonia. Possible small left effusion. ECHO: G HKN worse apex and septum with akinesis. LVEF 25% with regional wall motion abnormalities. Severely enlarged LV. G I DD. RV function normal.  Consultation: PCCM-admitting Cardiology-AHF team Hematology oncology Palliative care    Assessment and Plan: Shock-likely combination of sepsis and cardiogenic Severe acidosis Elevated troponin from demand ischemia: Patient managed IV antibiotics completed 3/10>Initially needed pressors, off pressors since 3/8 and BP soft. Medically stable to be discharged, awaiting SNF placement   Acute on chronic systolic CHF-cardiogenic  shock NICM with EF 25%-previously 20 to 25% September 2024 BiV PCM in place since 07/2022 Elevated troponin likely demand ischemia: Current echo shows EF 25% with RWMA,G HKN, enlarged LV.  Diuretics limited by hypotension.  Diuretics as needed managed by advanced heart failure team. Continue to hold hold home bisoprolol, hydralazine,Imdur, and amiodarone for now and will need to reassess as outpatient Appreciate cardiology input, continue to monitor daily I/O,weight, electrolytes and net balance as below and cont on  salt/fluid restricted diet and monitor in tele. Net IO Since Admission: -[id number]   Acute hypoxic respiratory failure CAP COPD: Completed antibiotics 3/10.  Doing well on room air. Continue budesonide [name] (on Trelegy at home)    AKI on CKD stage IV: Baseline creatinine 2.1-2.4.  Renal function remains stable.  Acute metabolic encephalopathy-: on presentation confused.  Fairly oriented communicative interactive. Awaiting placement   Moderate malnutrition/dysphagia: Off feeding tube.  Augment diet as tolerated dietitian following as below  Nutrition Problem: Moderate Malnutrition Etiology: chronic illness (COPD, CHF) Signs/Symptoms: severe muscle depletion, mild fat depletion Interventions: Refer to RD note for recommendations   HLD: Holding statin due to transaminitis.   Transaminitis Elevated INR/coagulopathy: Likely shock liver.  LFT downtrending.    Chronic anemia: Remains stable transfuse if less than 7    Acute thrombocytopenia: -trending up, follow-up in 1 week   Hypernatremia: Likely from diuresis.  Sodium improved, stable.   Hypokalemia Improved with supplements.   Type 2 diabetes mellitus with hypoglycemia: Blood sugar currently, continue SSI.  7 mm RML nodule: Needs follow-up in 6 to 12 months and pulmonary or pcp to arrange.  Blood sugar controlled   Chronic back pain Lumbar radiculitis Anxiety: Continue Effexor.  Hold opiates.    Goals of care: Patient with complex comorbidities.  Advanced heart failure team following, Palliative care consultation, prior to SNF placement.     Out of bed to chair. Incentive spirometry. Nursing supportive care. Fall, aspiration precautions. DVT prophylaxis   Code Status: Limited: Do not attempt resuscitation (DNR) -DNR-LIMITED -Do Not Intubate/DNI   Subjective: Patient is seen and examined today morning. She is pleasant, able to answer me appropriately. Denies any complaints.  Physical Exam: Vitals:   05/20/23 0412 05/20/23 0855 05/20/23 0856 05/20/23 0857  BP: 115/65     Pulse: 78     Resp: 15     Temp: 98.3 F (36.8 C)     TempSrc: Oral     SpO2: 97% 98% 99% 100%  Weight: 68.8 kg     Height:        General - Elderly African American female, no apparent distress HEENT - PERRLA, EOMI, atraumatic head, non tender sinuses. Lung - Clear, basal rales, no rhonchi, wheezes. Heart - S1, S2 heard, no murmurs, rubs, trace pedal edema. Abdomen - Soft, non tender, bowel sounds good Neuro - Alert, awake and oriented x 3, non focal exam. Skin - Warm and dry.  Data Reviewed:      Latest Ref Rng & Units 05/18/2023    5:46 AM 05/17/2023    5:02 AM 05/16/2023    4:41 AM  CBC  WBC 4.0 - 10.5 K/uL 9.3  8.5  7.5   Hemoglobin 12.0 - 15.0 g/dL 69.6  9.6  29.5   Hematocrit 36.0 - 46.0 % 30.6  28.8  30.4   Platelets 150 - 400 K/uL 116  85  58       Latest Ref Rng & Units 05/20/2023    8:05 AM 05/18/2023    5:46 AM 05/17/2023    5:02 AM  BMP  Glucose 70 - 99 mg/dL 90  284  83   BUN 8 - 23 mg/dL 54  78  88   Creatinine 0.44 - 1.00 mg/dL 1.32  4.40  1.02   Sodium 135 - 145 mmol/L 140  140  144   Potassium 3.5 - 5.1 mmol/L 3.9  3.6  3.4   Chloride 98 - 111 mmol/L 102  104  106   CO2 22 - 32 mmol/L 25  26  29    Calcium 8.9 - 10.3 mg/dL 8.8  8.5  8.5    No results found.  Family Communication: Discussed with patient, she understand and agree. All questions  answered.  Disposition: Status is: Inpatient Remains inpatient appropriate because: GOC, placement.  Planned Discharge Destination: Skilled nursing facility     Time spent: 38 minutes  Author: Marcelino Duster, MD 05/20/2023 9:05 AM Secure chat 7am to 7pm For on call review [email or web address].

## 2023-05-20 NOTE — Progress Notes (Signed)
 Advanced Heart Failure Rounding Note  Cardiologist: Vishnu P Mallipeddi, MD  Chief Complaint: systolic heart failure    Patient Profile  83 y/o female w/ 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 Septic shock 2/2 PNA. Required inotropes, NE and milrinone Freq PVCs limiting BiV pacing For SNF at discharge   Subjective:   Continues tofeel gradually better stillwith productive cough    Objective:   Weight Range: 68.8 kg Body mass index is 27.74 kg/m.   Vital Signs:   Temp:  [98.3 F (36.8 C)-98.6 F (37 C)] 98.3 F (36.8 C) (03/16 0412) Pulse Rate:  [76-78] 78 (03/16 0412) Resp:  [15-18] 15 (03/16 0412) BP: (111-115)/(65-69) 115/65 (03/16 0412) SpO2:  [97 %-100 %] 100 % (03/16 0857) Weight:  [68.8 kg] 68.8 kg (03/16 0412) Last BM Date : 05/19/23  Weight change: Filed Weights   05/18/23 0347 05/19/23 0421 05/20/23 0412  Weight: 71.3 kg 70.1 kg 68.8 kg    Intake/Output:   Intake/Output Summary (Last 24 hours) at 05/20/2023 1036 Last data filed at 05/20/2023 0912 Gross per 24 hour  Intake 712 ml  Output 1100 ml  Net -388 ml      Physical Exam  Well developed and nourished in no acute distress HENT normal Neck supple  Ronchi  Regular rate and rhythm, no murmurs or gallops Abd-soft with active BS No Clubbing cyanosis tr edema Skin-warm and dry A & Oriented  Grossly normal sensory and motor function    Telemetry   P-synchronous/ AV  pacing     EKG    N/A   Labs    CBC Recent Labs    05/18/23 0546  WBC 9.3  HGB 10.1*  HCT 30.6*  MCV 98.1  PLT 116*   Basic Metabolic Panel Recent Labs    16/10/96 0546 05/20/23 0805  NA 140 140  K 3.6 3.9  CL 104 102  CO2 26 25  GLUCOSE 102* 90  BUN 78* 54*  CREATININE 2.19* 1.99*  CALCIUM 8.5* 8.8*   Liver Function Tests Recent Labs    05/18/23 0546 05/20/23 0805  AST 53* 47*  ALT 300* 204*  ALKPHOS 105 98   BILITOT 2.3* 2.1*  PROT 5.2* 5.6*  ALBUMIN 2.6* 2.9*    No results for input(s): "LIPASE", "AMYLASE" in the last 72 hours. Cardiac Enzymes No results for input(s): "CKTOTAL", "CKMB", "CKMBINDEX", "TROPONINI" in the last 72 hours.  BNP: BNP (last 3 results) Recent Labs    04/09/23 1144 05/08/23 2255 05/15/23 0513  BNP >4,500.0* >4,500.0* >4,500.0*       Imaging       Medications:     Scheduled Medications:  allopurinol  150 mg Oral Daily   arformoterol  15 mcg Nebulization BID   budesonide (PULMICORT) nebulizer solution  0.5 mg Nebulization BID   famotidine  10 mg Oral Daily   feeding supplement  237 mL Oral BID BM   melatonin  3 mg Oral QHS   mouth rinse  15 mL Mouth Rinse 4 times per day   polyethylene glycol  17 g Oral Daily   polyvinyl alcohol  1 drop Both Eyes BID   revefenacin  175 mcg Nebulization Daily   torsemide  60 mg Oral Daily   venlafaxine  37.5 mg Oral BID WC      Assessment/Plan  1. Acute on Chronic Systolic Heart Failure Nonischemic cardiomyopathy. Cath in 8/23 with no  significant CAD, CI 2.24. ?LBBB cardiomyopathy versus familial versus prior myocarditis. Echo in 12/23 with EF < 20%, moderately reduced RV function, D-shaped septum, mild-moderate MR. She was admitted 1/24 with volume overload, empirically started on milrinone 0.25 and Lasix gtt.  Cardiac MRI showed LV severely dilated with EF 16% and septal-lateral dyssynchrony, RV moderately dilated with EF 23%, mid-apical inferolateral LGE in prior infarction pattern. However, CAD does not explain the extent of her cardiomyopathy based on cath. Echo in 5/23 showed EF <20% with moderate LV enlargement, septal-lateral dyssynchrony, moderate RV dysfunction, PASP 65 mmHg. Underwent CRT-P 5/24.  Echo 9/24 showed that EF is still 20-25%. She was noted to only be BiV pacing 87% of the time, frequent PVCs noted on ECG. Concern that PVCs were decreasing her BiV % and worsen LV function. - Admitted w/ mixed  septic and cardiogenic shock. Overall improved and off NE and Milrinone. Good response to IV Lasix. Volume status much improved. SCr trending down  - transition back to PO diuretics today, torsemide 60 mg daily  - GDMT limited by elevated SCr/AKI    2. Acute Hypoxic Respiratory Failure - much improved  - completed doxy + ceftriaxone for PNA - inhalers for COPD per IM - continue diuretics      3. AKI on CKD IIIb - baseline SCr ~2.2 - SCr peaked to 4.87 this admit, in setting of shock - improving, SCr >> 1.97 with ongoing diuresis      4. Shock Liver continues to improve   5. Thrombocytopenia - Plts trending back up 33K -->58->85>>116K  Check CBC in am     8. GOC: given multiple co morbidities, frailty and declining condition, recommend palliative care for GOC discussion    9. Deconditioning - needs SNF for rehab once discharged    Resume amiodarone for PVC suppression once we know liver function, but currently pretty quiet anticipate mon 3/17 Continue low dose bisoprolol Continue diuresis,  Cr at better than reported 2.2 baseline  Will recheck renal function but near her baseline which will limit GDMT      Length of Stay: 12  Sherryl Manges, MD  05/20/2023, 10:36 AM

## 2023-05-21 DIAGNOSIS — J9601 Acute respiratory failure with hypoxia: Secondary | ICD-10-CM | POA: Insufficient documentation

## 2023-05-21 DIAGNOSIS — A419 Sepsis, unspecified organism: Secondary | ICD-10-CM | POA: Diagnosis not present

## 2023-05-21 DIAGNOSIS — E785 Hyperlipidemia, unspecified: Secondary | ICD-10-CM

## 2023-05-21 DIAGNOSIS — I5023 Acute on chronic systolic (congestive) heart failure: Secondary | ICD-10-CM | POA: Diagnosis not present

## 2023-05-21 DIAGNOSIS — G9341 Metabolic encephalopathy: Secondary | ICD-10-CM | POA: Insufficient documentation

## 2023-05-21 DIAGNOSIS — E1122 Type 2 diabetes mellitus with diabetic chronic kidney disease: Secondary | ICD-10-CM

## 2023-05-21 LAB — COMPREHENSIVE METABOLIC PANEL
ALT: 171 U/L — ABNORMAL HIGH (ref 0–44)
AST: 40 U/L (ref 15–41)
Albumin: 3 g/dL — ABNORMAL LOW (ref 3.5–5.0)
Alkaline Phosphatase: 98 U/L (ref 38–126)
Anion gap: 10 (ref 5–15)
BUN: 50 mg/dL — ABNORMAL HIGH (ref 8–23)
CO2: 30 mmol/L (ref 22–32)
Calcium: 8.5 mg/dL — ABNORMAL LOW (ref 8.9–10.3)
Chloride: 100 mmol/L (ref 98–111)
Creatinine, Ser: 2.13 mg/dL — ABNORMAL HIGH (ref 0.44–1.00)
GFR, Estimated: 23 mL/min — ABNORMAL LOW (ref >60)
Glucose, Bld: 91 mg/dL (ref 70–99)
Potassium: 3.5 mmol/L (ref 3.5–5.1)
Sodium: 140 mmol/L (ref 135–145)
Total Bilirubin: 2.2 mg/dL — ABNORMAL HIGH (ref 0.0–1.2)
Total Protein: 5.8 g/dL — ABNORMAL LOW (ref 6.5–8.1)

## 2023-05-21 LAB — GLUCOSE, CAPILLARY
Glucose-Capillary: 120 mg/dL — ABNORMAL HIGH (ref 70–99)
Glucose-Capillary: 80 mg/dL (ref 70–99)
Glucose-Capillary: 82 mg/dL (ref 70–99)
Glucose-Capillary: 109 mg/dL — ABNORMAL HIGH (ref 70–99)

## 2023-05-21 LAB — CBC
HCT: 33.7 % — ABNORMAL LOW (ref 36.0–46.0)
Hemoglobin: 10.9 g/dL — ABNORMAL LOW (ref 12.0–15.0)
MCH: 32.2 pg (ref 26.0–34.0)
MCHC: 32.3 g/dL (ref 30.0–36.0)
MCV: 99.4 fL (ref 80.0–100.0)
Platelets: 184 10*3/uL (ref 150–400)
RBC: 3.39 MIL/uL — ABNORMAL LOW (ref 3.87–5.11)
RDW: 20.8 % — ABNORMAL HIGH (ref 11.5–15.5)
WBC: 6.7 10*3/uL (ref 4.0–10.5)
nRBC: 0 % (ref 0.0–0.2)

## 2023-05-21 MED ORDER — AMIODARONE HCL 200 MG PO TABS: ORAL_TABLET | ORAL | 2 refills | Status: DC

## 2023-05-21 MED ORDER — AMIODARONE HCL 200 MG PO TABS
200.0000 mg | ORAL_TABLET | Freq: Every day | ORAL | Status: DC
  Administered 2023-05-21: 200 mg via ORAL
  Filled 2023-05-21: qty 1

## 2023-05-21 MED ORDER — TORSEMIDE 60 MG PO TABS: 60.0000 mg | ORAL_TABLET | Freq: Every day | ORAL | 2 refills | Status: DC

## 2023-05-21 MED ORDER — ACETAMINOPHEN 325 MG PO TABS
650.0000 mg | ORAL_TABLET | Freq: Four times a day (QID) | ORAL | Status: DC | PRN
  Administered 2023-05-21: 650 mg via ORAL
  Filled 2023-05-21: qty 2

## 2023-05-21 MED ORDER — POTASSIUM CHLORIDE CRYS ER 20 MEQ PO TBCR
40.0000 meq | EXTENDED_RELEASE_TABLET | Freq: Once | ORAL | Status: AC
  Administered 2023-05-21: 40 meq via ORAL
  Filled 2023-05-21: qty 2

## 2023-05-21 NOTE — Discharge Summary (Addendum)
 Physician Discharge Summary   Patient: Heather [name] MRN: [id number] DOB: 08-12-1940  Admit date:     05/08/2023  Discharge date: 05/21/23  Discharge Physician: Marcelino Duster   PCP: [name], MD   Recommendations at discharge:    PCP follow up in 1 week. Cardiology follow up as scheduled. Palliative care evaluation and follow up at the facility.  Discharge Diagnoses: Principal Problem:   Acute sepsis (HCC) Active Problems:   Hyperlipidemia   Acute on chronic systolic CHF (congestive heart failure) (HCC)   Type 2 diabetes mellitus with diabetic chronic kidney disease (HCC)   Cardiogenic shock (HCC)   Malnutrition of moderate degree   Acute metabolic encephalopathy   Acute hypoxic respiratory failure (HCC)  Resolved Problems:   * No resolved hospital problems. *  Hospital Course: 83 year old female with multiple comorbidities including CKD IV B/L creat ~2-2.4,, T2DM chronic systolic CHF with EF 20-25% BiV PM in place since 07/2022, chronic pain syndrome and other comorbidities who on holdby police 3/4 found to have altered mental status last known normal the day prior, was hypoglycemic at 40 and brought to the ED which she was profoundly acidotic pH 6.69 pCO2 54, and found to have acute kidney failure, significant transaminitis lactic acidosis leukocytosis fever concerning for shock, and admitted to ICU on 05/08/23-on vasopressors bicarb drip broad-spectrum antibiotics and IV fluids. -Vasopressors subsequently weaned off, core track placed 3/7 for nutrition 3/10: Transferred to floor under TR[facility] service  Significant imaging/procedure: TH : No acute CT finding. Age related volume loss. Mild chronic small-vessel ischemic change of the white matter CT CAP: Dependent atelectasis/consolidation in both lower lobes. 7 mm right middle lobe nodule. Right Spigelian hernia containing only fat.  CXR: Cardiomegaly. Worsening airspace disease at the left > right lung base which may be  due to atelectasis or pneumonia. Possible small left effusion. ECHO: G HKN worse apex and septum with akinesis. LVEF 25% with regional wall motion abnormalities. Severely enlarged LV. G I DD. RV function normal.  Consultation: PCCM-admitting Cardiology-[facility] team Hematology oncology Palliative care    Assessment and Plan: Shock-likely combination of sepsis and cardiogenic-resolved Severe acidosis - resolved Elevated troponin from demand ischemia: Patient managed IV antibiotics completed 3/10>Initially needed pressors, off pressors since 3/8 and BP soft. Held her GDMT meds. Amiodarone resumed. She will need PCP follow up and cardiology follow up as scheduled.   Acute on chronic systolic CHF-cardiogenic shock NICM with EF 25%-previously 20 to 25% September 2024 BiV PCM in place since 07/2022 Elevated troponin likely demand ischemia: Current echo shows EF 25% with RWMA,G HKN, enlarged LV.  Diuretics limited by hypotension.  Lasix IV changed to Torsemide 60 mg oral daily. Hold home bisoprolol, hydralazine,Imdur, and amiodarone for now and will need to reassess as outpatient. Continue to monitor daily I/O,weight, electrolytes as outpatient. Salt/fluid restricted diet advised.   Acute hypoxic respiratory failure CAP COPD: Completed antibiotics 3/10.  Doing well on room air. Continue on Trelegy.   AKI on CKD stage IV: Baseline creatinine 2.1-2.4.  Renal function remains stable.   Acute metabolic encephalopathy-: on presentation confused.   This has improved, she is oriented communicating well.  Moderate malnutrition/dysphagia: Off feeding tube.  Augment diet as tolerated.  Nutrition Problem: Moderate Malnutrition Etiology: chronic illness (COPD, CHF) Signs/Symptoms: severe muscle depletion, mild fat depletion Interventions:    HLD: Resumed statin as her LFT improved.   Transaminitis Elevated INR/coagulopathy: Likely shock liver.  LFT improving.    Chronic anemia: Remains  stable.  Acute thrombocytopenia: Improved. follow-up in 1 week as outpatient.   Hypernatremia: Likely from diuresis.  Sodium improved, stable.   Hypokalemia Improved with supplements.   Type 2 diabetes mellitus with hypoglycemia: Blood sugar currently, continue SSI.   7 mm RML nodule: Needs follow-up in 6 to 12 months and pulmonary or pcp to arrange.  Blood sugar controlled   Chronic back pain Lumbar radiculitis Anxiety: Continue Effexor.  Hold opiates.   Goals of care: Patient with complex comorbidities.   Advanced heart failure team following, Palliative care consultation as outpatient at SNF.        Procedures performed: none  Disposition: Skilled nursing facility Diet recommendation:  Discharge Diet Orders (From admission, onward)     Start     Ordered   05/21/23 0000  Diet - low sodium heart healthy        05/21/23 1059           Cardiac diet DISCHARGE MEDICATION: Allergies as of 05/21/2023       Reactions   Lovastatin Swelling   Patient states that her tongue swells and she gets a tingling feeling all over.   Lisinopril Swelling   Tongue swelling        Medication List     STOP taking these medications    aspirin 81 MG chewable tablet Commonly known as: Aspirin Childrens   bisoprolol 5 MG tablet Commonly known as: ZEBETA   empagliflozin 10 MG Tabs tablet Commonly known as: Jardiance   furosemide 40 MG tablet Commonly known as: LASIX   hydrALAZINE 25 MG tablet Commonly known as: APRESOLINE   HYDROcodone-acetaminophen 5-325 MG tablet Commonly known as: NORCO/VICODIN   isosorbide mononitrate 30 MG 24 hr tablet Commonly known as: IMDUR   metolazone 2.5 MG tablet Commonly known as: ZAROXOLYN   zolpidem 5 MG tablet Commonly known as: AMBIEN       TAKE these medications    acetaminophen 500 MG tablet Commonly known as: TYLENOL Take 500 mg by mouth every 6 (six) hours as needed for mild pain or moderate pain.    albuterol 108 (90 Base) MCG/ACT inhaler Commonly known as: [name] Inhale 2 puffs into the lungs every 6 (six) hours as needed for wheezing or shortness of breath.   allopurinol 300 MG tablet Commonly known as: ZYLOPRIM TAKE ONE-HALF TABLET BY MOUTH EVERY DAY   amiodarone 200 MG tablet Commonly known as: PACERONE Patient takes 1 tablet by mouth daily. What changed: See the new instructions.   atorvastatin 20 MG tablet Commonly known as: LIPITOR Take 1 tablet (20 mg total) by mouth daily.   chlorpheniramine 4 MG tablet Commonly known as: CHLOR-TRIMETON Take 4 mg by mouth daily as needed for allergies.   DIALYVITE VITAMIN D 5000 PO Take 5,000 Units by mouth daily.   famotidine 20 MG tablet Commonly known as: Pepcid Take 1 tablet (20 mg total) by mouth at bedtime. One after supper   ketoconazole 2 % cream Commonly known as: NIZORAL Apply 1 Application topically daily.   multivitamin with minerals tablet Take 1 tablet by mouth daily.   nitrofurantoin (macrocrystal-monohydrate) 100 MG capsule Commonly known as: MACROBID Take 100 mg by mouth every 12 (twelve) hours.   Systane Complete 0.6 % Soln Generic drug: Propylene Glycol Place 1 drop into both eyes 2 (two) times daily.   Torsemide 60 MG Tabs Take 60 mg by mouth daily. Start taking on: May 22, 2023   Trelegy Ellipta 200-62.5-25 MCG/ACT Aepb Generic drug: Fluticasone-Umeclidin-Vilant Inhale 1  puff into the lungs daily.   venlafaxine XR 75 MG 24 hr capsule Commonly known as: EFFEXOR-XR Take 1 capsule (75 mg total) by mouth See admin instructions.        Contact information for follow-up providers     [name], MD. Go to.   Specialty: Internal Medicine Why: Hospital follow up appointment scheduled for Wednesday, May 30, 2023 at 11:00 AM.  PLEASE ARRIVE 10-15 minutes early.  PLEASE call to cancel/reschedule if you CANNOT make appointment. Contact information: [address] [place] [address] [phone]         [facility] Follow up on 06/11/2023.   Specialty: Cardiology Why: at 9:30 am Heart and Vascular Tower Entrance C Contact information: [address] [place] [address] [phone]             Contact information for after-discharge care     Destination     [facility] .   Service: Skilled Nursing Contact information: [address] [place] [address] [phone]                    Discharge Exam: Filed Weights   05/19/23 0421 05/20/23 0412 05/21/23 0500  Weight: 70.1 kg 68.8 kg 66.5 kg      05/21/2023    7:59 AM 05/21/2023    5:00 AM 05/21/2023    4:13 AM  Vitals with BMI  Weight  146 lbs 10 oz   BMI  26.81   Systolic 113  124  Diastolic 71  66  Pulse 82  79    General - Elderly African American female, no apparent distress HEENT - PERRLA, EOMI, atraumatic head, non tender sinuses. Lung - Clear, basal rales, no rhonchi, wheezes. Heart - S1, S2 heard, no murmurs, rubs, trace pedal edema. Abdomen - Soft, non tender, bowel sounds good Neuro - Alert, awake and oriented x 3, non focal exam. Skin - Warm and dry.   Condition at discharge: stable  The results of significant diagnostics from this hospitalization (including imaging, microbiology, ancillary and laboratory) are listed below for reference.   Imaging Studies: DG CHEST PORT 1 VIEW Result Date: 05/16/2023 CLINICAL DATA:  Shortness of breath and respiratory distress. EXAM: PORTABLE CHEST 1 VIEW COMPARISON:  05/14/2023 FINDINGS: Enteric tube tip courses below the level of the GE junction. There is a left chest wall pacer device with leads in the right atrial appendage and right ventricle. Aortic atherosclerosis. Stable cardiomediastinal contours. Unchanged bilateral pleural effusions with basilar atelectasis. Mild interstitial edema. IMPRESSION: 1. Unchanged  bilateral pleural effusions with basilar atelectasis. 2. Mild interstitial edema. Electronically Signed   By: Signa Kell M.D.   On: 05/16/2023 06:59   DG Chest 2 View Result Date: 05/14/2023 CLINICAL DATA:  Shortness of breath. EXAM: CHEST - 2 VIEW COMPARISON:  Radiograph and CT 05/08/2023 FINDINGS: Weighted enteric tube in place. Left-sided pacemaker in place. Cardiomegaly is stable. Increasing bilateral pleural effusions and basilar opacities. Developing vascular congestion. No pneumothorax. IMPRESSION: 1. Increasing bilateral pleural effusions and basilar opacities, atelectasis versus pneumonia. 2. Cardiomegaly with developing vascular congestion. Electronically Signed   By: Narda Rutherford M.D.   On: 05/14/2023 15:23   DG Swallowing Func-Speech Pathology Result Date: 05/13/2023 Table formatting from the original result was not included. Modified Barium Swallow Study Patient Details Name: Heather [name] MRN: [id number] Date of Birth: 05/17/1940 Today's Date: 05/13/2023 HPI/PMH: HPI: 83yo female admitted  05/08/23 with weakness, AMS following police wellness visit. PMH: HFrEF, renal insuff, HTN, gout, longterm use of opiate analgesic, chronic low back pain, HLD, anxiety, COPD, GERD, AKI, DM, CKD4, sCHF. CXR = worsening airspace dz L>RLL. HeadCT = no acute finding Clinical Impression: Clinical Impression: Patient presents with a functional oropharyngeal swallow. Intermittent delay in swallow initiation noted with thin liquids, first bolus and when combined with pill, with resultant trace flash penetration of liquids. No frank penetration or aspiration observed. Trace-mild lingual, base of tongue, and vallecular coating noted post swallow, suspect secondary to dry oral cavity and dried secretions/blood noted to coat epiglottis during FEES this am. This residue clears with subsequent swallows. Recommend dysphagia 2 solids, for energy conservation as patient with increased WOB during mastication, and thin liquids  with SLP f/u for tolerance and potential to advance. Factors that may increase risk of adverse event in presence of aspiration Rubye Oaks & Clearance Coots 2021): No data recorded Recommendations/Plan: Swallowing Evaluation Recommendations Swallowing Evaluation Recommendations Recommendations: PO diet PO Diet Recommendation: Dysphagia 2 (Finely chopped); Thin liquids (Level 0) Liquid Administration via: Cup; Straw Medication Administration: Whole meds with liquid Supervision: Staff to assist with self-feeding Swallowing strategies  : Slow rate; Small bites/sips Postural changes: Position pt fully upright for meals Oral care recommendations: Oral care BID (2x/day) Treatment Plan Treatment Plan Treatment recommendations: Therapy as outlined in treatment plan below Follow-up recommendations: No SLP follow up Functional status assessment: Patient has had a recent decline in their functional status and demonstrates the ability to make significant improvements in function in a reasonable and predictable amount of time. Treatment frequency: Min 1x/week Treatment duration: 1 week Interventions: Aspiration precaution training; Patient/family education; Trials of upgraded texture/liquids; Diet toleration management by SLP Recommendations Recommendations for follow up therapy are one component of a multi-disciplinary discharge planning process, led by the attending physician.  Recommendations may be updated based on patient status, additional functional criteria and insurance authorization. Assessment: Orofacial Exam: Orofacial Exam Oral Cavity: Oral Hygiene: -- (green lingual coating from FEES) Oral Cavity - Dentition: Dentures, top; Missing dentition; Other (Comment) Orofacial Anatomy: WFL Oral Motor/Sensory Function: WFL Anatomy: Anatomy: -- (suspected dried secretions on epiglottis) Boluses Administered: Boluses Administered Boluses Administered: Thin liquids (Level 0); Mildly thick liquids (Level 2, nectar thick); Moderately thick  liquids (Level 3, honey thick); Puree; Solid  Oral Impairment Domain: Oral Impairment Domain Lip Closure: No labial escape Tongue control during bolus hold: Cohesive bolus between tongue to palatal seal Bolus preparation/mastication: Slow prolonged chewing/mashing with complete recollection Bolus transport/lingual motion: Delayed initiation of tongue motion (oral holding) Oral residue: Trace residue lining oral structures Location of oral residue : Tongue; Palate Initiation of pharyngeal swallow : Pyriform sinuses  Pharyngeal Impairment Domain: Pharyngeal Impairment Domain Soft palate elevation: No bolus between soft palate (SP)/pharyngeal wall (PW) Laryngeal elevation: Complete superior movement of thyroid cartilage with complete approximation of arytenoids to epiglottic petiole Anterior hyoid excursion: Complete anterior movement Epiglottic movement: Complete inversion Laryngeal vestibule closure: Incomplete, narrow column air/contrast in laryngeal vestibule Pharyngeal stripping wave : Present - complete Pharyngeal contraction (A/P view only): N/A Pharyngoesophageal segment opening: Complete distension and complete duration, no obstruction of flow Tongue base retraction: Trace column of contrast or air between tongue base and PPW Pharyngeal residue: Trace residue within or on pharyngeal structures Location of pharyngeal residue: Valleculae; Tongue base  Esophageal Impairment Domain: Esophageal Impairment Domain Esophageal clearance upright position: Complete clearance, esophageal coating Pill: Pill Consistency administered: Thin liquids (Level 0) Thin liquids (Level 0): [facility] Penetration/Aspiration Scale  Score: Penetration/Aspiration Scale Score 1.  Material does not enter airway: Thin liquids (Level 0); Mildly thick liquids (Level 2, nectar thick); Moderately thick liquids (Level 3, honey thick); Puree; Solid; Pill 2.  Material enters airway, remains ABOVE vocal cords then ejected out: Thin liquids (Level 0)  Compensatory Strategies: No data recorded  General Information: Caregiver present: No  Diet Prior to this Study: NPO   Temperature : Normal   Respiratory Status: Increased WOB (with activity)   Supplemental O2: Nasal cannula   History of Recent Intubation: No  Behavior/Cognition: Alert; Cooperative; Pleasant mood; Confused Self-Feeding Abilities: Able to self-feed Baseline vocal quality/speech: Normal Volitional Cough: Able to elicit Volitional Swallow: Able to elicit No data recorded Goal Planning: Prognosis for improved oropharyngeal function: Good No data recorded No data recorded No data recorded Consulted and agree with results and recommendations: Patient; Nurse Pain: Pain Assessment Pain Assessment: No/denies pain Pain Score: 0 Facial Expression: 0 Body Movements: 0 Muscle Tension: 0 Compliance with ventilator (intubated pts.): N/A Vocalization (extubated pts.): 0 CPOT Total: 0 End of Session: Start Time:SLP Start Time (ACUTE ONLY): 1422 Stop Time: SLP Stop Time (ACUTE ONLY): 1435 Time Calculation:SLP Time Calculation (min) (ACUTE ONLY): 13 min Charges: SLP Evaluations $ SLP Speech Visit: 1 Visit SLP Evaluations $MBS Swallow: 1 Procedure $FEES Swallow: 1 Procedure $Swallowing Treatment: 1 Procedure SLP visit diagnosis: SLP Visit Diagnosis: Dysphagia, oropharyngeal phase (R13.12) Past Medical History: Past Medical History: Diagnosis Date  Anemia   Anxiety   Arthritis   COPD (chronic obstructive pulmonary disease) (HCC)   CVA (cerebral vascular accident) (HCC)   Depression   Diabetes mellitus without complication (HCC)   Dry eyes 04/14/2014  GERD (gastroesophageal reflux disease)   Glaucoma   Gout   HTN (hypertension)   OSA (obstructive sleep apnea)  Past Surgical History: Past Surgical History: Procedure Laterality Date  ABDOMINAL HYSTERECTOMY    BACK SURGERY    lumbar-disc  BIV PACEMAKER INSERTION CRT-P N/A 07/12/2022  Procedure: BIV PACEMAKER INSERTION CRT-P;  Surgeon: Duke Salvia, MD;  [facility]: [facility]  INVASIVE CV LAB;  Service: Cardiovascular;  Laterality: N/A;  BUNIONECTOMY Bilateral   FOOT SURGERY Left   repair of "kissing Cousins"  JOINT REPLACEMENT Bilateral   hip   JOINT REPLACEMENT Right 2010 or 2012  knee  KNEE SURGERY    RIGHT/LEFT HEART CATH AND CORONARY ANGIOGRAPHY N/A 10/27/2021  Procedure: RIGHT/LEFT HEART CATH AND CORONARY ANGIOGRAPHY;  Surgeon: Corky Crafts, MD;  Location: [facility] INVASIVE CV LAB;  Service: Cardiovascular;  Laterality: N/A;  SHOULDER ARTHROSCOPY Right   shoulder surgery in [place] about 2000  SHOULDER HEMI-ARTHROPLASTY Left 03/25/2014  Procedure: LEFT SHOULDER HEMI-ARTHROPLASTY;  Surgeon: Vickki Hearing, MD;  Location: [facility];  Service: Orthopedics;  Laterality: Left; Ferdinand Lango MA, CCC-SLP McCoy Leah Meryl 05/13/2023, 2:58 PM  DG Abd Portable 1V Result Date: 05/11/2023 CLINICAL DATA:  Feeding tube placement. EXAM: PORTABLE ABDOMEN - 1 VIEW COMPARISON:  CT 05/08/2023 FINDINGS: Tip of the weighted enteric tube in the left abdomen in the region of the mid distal stomach. IVC filter is partially included in the field of view. No bowel dilatation in the upper abdomen. IMPRESSION: Tip of the weighted enteric tube in the left abdomen in the region of the mid distal stomach. Electronically Signed   By: Narda Rutherford M.D.   On: 05/11/2023 17:54   ECHOCARDIOGRAM COMPLETE Result Date: 05/09/2023    ECHOCARDIOGRAM REPORT   Patient Name:   Heather [name] Date of Exam: 05/09/2023 Medical Rec #:  [id number]      Height:       62.0 in Accession #:    [id number]     Weight:       162.3 lb Date of Birth:  1941-01-13      BSA:          1.749 m Patient Age:    82 years       BP:           103/50 mmHg Patient Gender: F              HR:           60 bpm. Exam Location:  Inpatient Procedure: 2D Echo, Cardiac Doppler, Color Doppler and Intracardiac            Opacification Agent (Both Spectral and Color Flow Doppler were            utilized during procedure). Indications:    Shock  History:         Patient has prior history of Echocardiogram examinations, most                 recent 11/27/2022. CHF and Cardiomyopathy; Risk                 Factors:Hypertension and Diabetes.  Sonographer:    Amy Chionchio Referring Phys: Rutherford Guys, P IMPRESSIONS  1. Strain, 3D EF not performed Global hypokinesis worse in apex and septum with akinesis . Left ventricular ejection fraction, by estimation, is 25%. The left ventricle has normal function. The left ventricle demonstrates regional wall motion abnormalities (see scoring diagram/findings for description). The left ventricular internal cavity size was severely dilated. Left ventricular diastolic parameters are consistent with Grade I diastolic dysfunction (impaired relaxation). Elevated left ventricular end-diastolic pressure.  2. Device leads in RA/RV. Right ventricular systolic function is normal. The right ventricular size is normal.  3. Left atrial size was mildly dilated.  4. Right atrial size was mildly dilated.  5. The mitral valve is abnormal. Mild mitral valve regurgitation. No evidence of mitral stenosis.  6. Tricuspid valve regurgitation is moderate.  7. The aortic valve is tricuspid. There is moderate calcification of the aortic valve. There is moderate thickening of the aortic valve. Aortic valve regurgitation is not visualized. Aortic valve sclerosis is present, with no evidence of aortic valve stenosis.  8. The inferior vena cava is normal in size with greater than 50% respiratory variability, suggesting right atrial pressure of 3 mmHg. FINDINGS  Left Ventricle: Strain, 3D EF not performed Global hypokinesis worse in apex and septum with akinesis. Left ventricular ejection fraction, by estimation, is 25%. The left ventricle has normal function. The left ventricle demonstrates regional wall motion abnormalities. Definity contrast agent was given IV to delineate the left ventricular endocardial borders. Strain was performed and the global longitudinal strain  is indeterminate. The left ventricular internal cavity size was severely dilated. There is no left ventricular hypertrophy. Left ventricular diastolic parameters are consistent with Grade I diastolic dysfunction (impaired relaxation). Elevated left ventricular end-diastolic pressure. Right Ventricle: Device leads in RA/RV. The right ventricular size is normal. No increase in right ventricular wall thickness. Right ventricular systolic function is normal. Left Atrium: Left atrial size was mildly dilated. Right Atrium: Right atrial size was mildly dilated. Pericardium: There is no evidence of pericardial effusion. Mitral Valve: The mitral valve is abnormal. There is mild thickening of the mitral valve leaflet(s). Mild mitral valve regurgitation. No evidence of mitral valve stenosis. MV  peak gradient, 7.6 mmHg. The mean mitral valve gradient is 3.0 mmHg. Tricuspid Valve: The tricuspid valve is normal in structure. Tricuspid valve regurgitation is moderate . No evidence of tricuspid stenosis. Aortic Valve: The aortic valve is tricuspid. There is moderate calcification of the aortic valve. There is moderate thickening of the aortic valve. Aortic valve regurgitation is not visualized. Aortic valve sclerosis is present, with no evidence of aortic valve stenosis. Aortic valve mean gradient measures 8.0 mmHg. Aortic valve peak gradient measures 12.7 mmHg. Aortic valve area, by VTI measures 1.50 cm. Pulmonic Valve: The pulmonic valve was normal in structure. Pulmonic valve regurgitation is mild. No evidence of pulmonic stenosis. Aorta: The aortic root is normal in size and structure. Venous: The inferior vena cava is normal in size with greater than 50% respiratory variability, suggesting right atrial pressure of 3 mmHg. IAS/Shunts: No atrial level shunt detected by color flow Doppler. Additional Comments: 3D was performed not requiring image post processing on an independent workstation and was indeterminate.  LEFT  VENTRICLE PLAX 2D LVIDd:         5.70 cm      Diastology LVIDs:         5.10 cm      LV e' medial:    2.94 cm/s LV PW:         1.00 cm      LV E/e' medial:  30.9 LV IVS:        0.80 cm      LV e' lateral:   4.13 cm/s LVOT diam:     1.90 cm      LV E/e' lateral: 22.0 LV SV:         51 LV SV Index:   29 LVOT Area:     2.84 cm  LV Volumes (MOD) LV vol d, MOD A4C: 306.0 ml LV vol s, MOD A4C: 226.0 ml LV SV MOD A4C:     306.0 ml RIGHT VENTRICLE            IVC RV Basal diam:  6.20 cm    IVC diam: 2.30 cm RV Mid diam:    3.90 cm RV S prime:     8.59 cm/s TAPSE (M-mode): 1.1 cm LEFT ATRIUM           Index        RIGHT ATRIUM           Index LA Vol (A4C): 82.7 ml 47.28 ml/m  RA Area:     27.70 cm                                    RA Volume:   105.00 ml 60.03 ml/m  AORTIC VALVE                     PULMONIC VALVE AV Area (Vmax):    1.51 cm      PV Vmax:       1.07 m/s AV Area (Vmean):   1.37 cm      PV Peak grad:  4.6 mmHg AV Area (VTI):     1.50 cm AV Vmax:           178.00 cm/s AV Vmean:          133.000 cm/s AV VTI:            0.341 m AV Peak Grad:  12.7 mmHg AV Mean Grad:      8.0 mmHg LVOT Vmax:         94.70 cm/s LVOT Vmean:        64.300 cm/s LVOT VTI:          0.181 m LVOT/AV VTI ratio: 0.53  AORTA Ao Root diam: 2.30 cm Ao Asc diam:  2.60 cm MITRAL VALVE MV Area (PHT): 1.98 cm     SHUNTS MV Area VTI:   1.23 cm     Systemic VTI:  0.18 m MV Peak grad:  7.6 mmHg     Systemic Diam: 1.90 cm MV Mean grad:  3.0 mmHg MV Vmax:       1.38 m/s MV Vmean:      76.0 cm/s MV Decel Time: 383 msec MV E velocity: 90.90 cm/s MV A velocity: 120.00 cm/s MV E/A ratio:  0.76 Charlton Haws MD Electronically signed by Charlton Haws MD Signature Date/Time: 05/09/2023/9:31:51 AM    Final    DG CHEST PORT 1 VIEW Result Date: 05/08/2023 CLINICAL DATA:  Central line EXAM: PORTABLE CHEST 1 VIEW COMPARISON:  05/08/2023, CT 05/08/2023 FINDINGS: Left-sided pacing device as before. Interval right central venous catheter tip over the SVC. No  pneumothorax. Cardiomegaly with worsening airspace disease in the left greater than right lung base. Possible small left effusion. Aortic atherosclerosis. Left shoulder replacement IMPRESSION: 1. Right IJ central venous catheter tip at the SVC.  No pneumothorax 2. Cardiomegaly 3. Worsening airspace disease at the left greater than right lung base which may be due to atelectasis or pneumonia. Possible small left effusion Electronically Signed   By: Jasmine Pang M.D.   On: 05/08/2023 20:39   CT CHEST ABDOMEN PELVIS WO CONTRAST Result Date: 05/08/2023 CLINICAL DATA:  Sepsis, hypoglycemia, altered mental status EXAM: CT CHEST, ABDOMEN AND PELVIS WITHOUT CONTRAST TECHNIQUE: Multidetector CT imaging of the chest, abdomen and pelvis was performed following the standard protocol without IV contrast. RADIATION DOSE REDUCTION: This exam was performed according to the departmental dose-optimization program which includes automated exposure control, adjustment of the mA and/or kV according to patient size and/or use of iterative reconstruction technique. COMPARISON:  08/27/2001 by report only FINDINGS: CT CHEST FINDINGS Cardiovascular: Left subclavian transvenous pacemaker. Cardiomegaly with biventricular enlargement. No pericardial effusion. Coronary and aortic calcifications. Central great vessels normal in caliber. Mediastinum/Nodes: No hematoma, mass, or adenopathy. Lungs/Pleura: No significant pleural effusion. No pneumothorax. Dependent atelectasis/consolidation in both lower lobes. 7 mm nodule, lateral right middle lobe (Im90,Se3) . Musculoskeletal: Post left shoulder arthroplasty. Right shoulder DJD. CT ABDOMEN PELVIS FINDINGS Hepatobiliary: Hyperdense contents of the nondilated gallbladder. No focal liver lesion or biliary ductal dilatation. Pancreas: Unremarkable. No pancreatic ductal dilatation or surrounding inflammatory changes. Spleen: Normal in size without focal abnormality. Adrenals/Urinary Tract: No adrenal  mass. No urolithiasis or hydronephrosis. Right kidney is ptotic. Hyperdense 1.1 cm left upper pole and 1.3 cm right mid pole renal lesions, possibly hyperdense cysts but nonspecific. Urinary bladder is nondilated, significant image degradation secondary to streak artifact from hip hardware. Stomach/Bowel: Stomach is nondilated. Small bowel decompressed. The colon is incompletely distended, without acute finding. Vascular/Lymphatic: Heavy aortoiliac calcified plaque without aneurysm. Infrarenal TrapEase IVC filter. No abdominal or pelvic adenopathy localized. Reproductive: Limited assessment due to significant streak artifact from hip hardware. Other: Small volume presacral fluid. No abdominal ascites. No free air. Musculoskeletal: Right spigelian hernia containing only fat. Advanced lower lumbar facet DJD, likely accounting for the grade 1 anterolisthesis L5-S1. Bilateral hip arthroplasty hardware resulting in  significant regional streak artifact. IMPRESSION: 1. Dependent atelectasis/consolidation in both lower lobes. 2. Coronary and Aortic Atherosclerosis (ICD10-170.0). 3. 7 mm right middle lobe nodule. Non-contrast chest CT at 6-12 months is recommended. If the nodule is stable at time of repeat CT, then future CT at 18-24 months (from today's scan) is considered optional for low-risk patients, but is recommended for high-risk patients. This recommendation follows the consensus statement: Guidelines for Management of Incidental Pulmonary Nodules Detected on CT Images: From the Fleischner Society 2017; Radiology 2017; 284:228-243. 4. Right Spigelian hernia containing only fat. Electronically Signed   By: Corlis Leak M.D.   On: 05/08/2023 14:57   DG Chest Port 1 View Result Date: 05/08/2023 CLINICAL DATA:  Questionable sepsis - evaluate for abnormality EXAM: PORTABLE CHEST - 1 VIEW COMPARISON:  07/12/2022 FINDINGS: Some increase in atelectasis or infiltrate at the left lung base. Right lung clear. Heart size upper  limits normal. Aortic Atherosclerosis (ICD10-170.0). Stable left subclavian transvenous pacemaker. Persistent blunting of the left lateral costophrenic angle. Post left shoulder arthroplasty. IMPRESSION: Increasing left basilar atelectasis or infiltrate. Electronically Signed   By: Corlis Leak M.D.   On: 05/08/2023 14:46   CT Head Wo Contrast Result Date: 05/08/2023 CLINICAL DATA:  Mental status change of unknown cause. Hypoglycemia. EXAM: CT HEAD WITHOUT CONTRAST TECHNIQUE: Contiguous axial images were obtained from the base of the skull through the vertex without intravenous contrast. RADIATION DOSE REDUCTION: This exam was performed according to the departmental dose-optimization program which includes automated exposure control, adjustment of the mA and/or kV according to patient size and/or use of iterative reconstruction technique. COMPARISON:  09/10/2021 FINDINGS: Brain: Age related volume loss. Mild chronic small-vessel ischemic change of the white matter. No evidence of acute infarction, mass lesion, hemorrhage, hydrocephalus or extra-axial collection. Vascular: There is atherosclerotic calcification of the major vessels at the base of the brain. Skull: Negative Sinuses/Orbits: Clear/normal Other: None IMPRESSION: No acute CT finding. Age related volume loss. Mild chronic small-vessel ischemic change of the white matter. Electronically Signed   By: Paulina Fusi M.D.   On: 05/08/2023 14:29    Microbiology: Results for orders placed or performed during the hospital encounter of 05/08/23  Blood Culture (routine x 2)     Status: None   Collection Time: 05/08/23 11:55 AM   Specimen: BLOOD  Result Value Ref Range Status   Specimen Description BLOOD RIGHT ANTECUBITAL  Final   Special Requests   Final    BOTTLES DRAWN AEROBIC AND ANAEROBIC Blood Culture adequate volume   Culture   Final    NO GROWTH 5 DAYS Performed at [facility], [address], [place], [place] [address]    Report Status  05/13/2023 FINAL  Final  Blood Culture (routine x 2)     Status: None   Collection Time: 05/08/23 12:15 PM   Specimen: BLOOD  Result Value Ref Range Status   Specimen Description BLOOD BLOOD RIGHT WRIST  Final   Special Requests   Final    BOTTLES DRAWN AEROBIC ONLY Blood Culture results may not be optimal due to an inadequate volume of blood received in culture bottles   Culture   Final    NO GROWTH 5 DAYS Performed at [facility], [address], [place], [place] [address]    Report Status 05/13/2023 FINAL  Final  Resp panel by RT-PCR (RSV, Flu A&B, Covid) Anterior Nasal Swab     Status: None   Collection Time: 05/08/23 12:27 PM   Specimen: Anterior Nasal  Swab  Result Value Ref Range Status   SARS Coronavirus 2 by RT PCR NEGATIVE NEGATIVE Final    Comment: (NOTE) SARS-CoV-2 target nucleic acids are NOT DETECTED.  The SARS-CoV-2 RNA is generally detectable in upper respiratory specimens during the acute phase of infection. The lowest concentration of SARS-CoV-2 viral copies this assay can detect is 138 copies/mL. A negative result does not preclude SARS-Cov-2 infection and should not be used as the sole basis for treatment or other patient management decisions. A negative result may occur with  improper specimen collection/handling, submission of specimen other than nasopharyngeal swab, presence of viral mutation(s) within the areas targeted by this assay, and inadequate number of viral copies(<138 copies/mL). A negative result must be combined with clinical observations, patient history, and epidemiological information. The expected result is Negative.  Fact Sheet for Patients:  BloggerCourse.com  Fact Sheet for Healthcare Providers:  SeriousBroker.it  This test is no t yet approved or cleared by the [place] FDA and  has been authorized for detection and/or diagnosis of SARS-CoV-2 by FDA under an Emergency Use  Authorization (EUA). This EUA will remain  in effect (meaning this test can be used) for the duration of the COVID-19 declaration under Section 564(b)(1) of the Act, 21 U.S.C.section 360bbb-3(b)(1), unless the authorization is terminated  or revoked sooner.       Influenza A by PCR NEGATIVE NEGATIVE Final   Influenza B by PCR NEGATIVE NEGATIVE Final    Comment: (NOTE) The Xpert Xpress SARS-CoV-2/FLU/RSV plus assay is intended as an aid in the diagnosis of influenza from Nasopharyngeal swab specimens and should not be used as a sole basis for treatment. Nasal washings and aspirates are unacceptable for Xpert Xpress SARS-CoV-2/FLU/RSV testing.  Fact Sheet for Patients: BloggerCourse.com  Fact Sheet for Healthcare Providers: SeriousBroker.it  This test is not yet approved or cleared by the [place] FDA and has been authorized for detection and/or diagnosis of SARS-CoV-2 by FDA under an Emergency Use Authorization (EUA). This EUA will remain in effect (meaning this test can be used) for the duration of the COVID-19 declaration under Section 564(b)(1) of the Act, 21 U.S.C. section 360bbb-3(b)(1), unless the authorization is terminated or revoked.     Resp Syncytial Virus by PCR NEGATIVE NEGATIVE Final    Comment: (NOTE) Fact Sheet for Patients: BloggerCourse.com  Fact Sheet for Healthcare Providers: SeriousBroker.it  This test is not yet approved or cleared by the [place] FDA and has been authorized for detection and/or diagnosis of SARS-CoV-2 by FDA under an Emergency Use Authorization (EUA). This EUA will remain in effect (meaning this test can be used) for the duration of the COVID-19 declaration under Section 564(b)(1) of the Act, 21 U.S.C. section 360bbb-3(b)(1), unless the authorization is terminated or revoked.  Performed at [facility], [address]  [place], [place] [address]   Respiratory (~20 pathogens) panel by PCR     Status: None   Collection Time: 05/08/23  6:09 PM   Specimen: Nasopharyngeal Swab; Respiratory  Result Value Ref Range Status   Adenovirus NOT DETECTED NOT DETECTED Final   Coronavirus 229E NOT DETECTED NOT DETECTED Final    Comment: (NOTE) The Coronavirus on the Respiratory Panel, DOES NOT test for the novel  Coronavirus (2019 nCoV)    Coronavirus HKU1 NOT DETECTED NOT DETECTED Final   Coronavirus NL63 NOT DETECTED NOT DETECTED Final   Coronavirus OC43 NOT DETECTED NOT DETECTED Final   Metapneumovirus NOT DETECTED NOT DETECTED Final   Rhinovirus / Enterovirus  NOT DETECTED NOT DETECTED Final   Influenza A NOT DETECTED NOT DETECTED Final   Influenza B NOT DETECTED NOT DETECTED Final   Parainfluenza Virus 1 NOT DETECTED NOT DETECTED Final   Parainfluenza Virus 2 NOT DETECTED NOT DETECTED Final   Parainfluenza Virus 3 NOT DETECTED NOT DETECTED Final   Parainfluenza Virus 4 NOT DETECTED NOT DETECTED Final   Respiratory Syncytial Virus NOT DETECTED NOT DETECTED Final   Bordetella pertussis NOT DETECTED NOT DETECTED Final   Bordetella Parapertussis NOT DETECTED NOT DETECTED Final   Chlamydophila pneumoniae NOT DETECTED NOT DETECTED Final   Mycoplasma pneumoniae NOT DETECTED NOT DETECTED Final    Comment: Performed at [facility] [facility], [address], [place], [place] [address]  MRSA Next Gen by PCR, Nasal     Status: None   Collection Time: 05/08/23  6:13 PM   Specimen: Nasal Mucosa; Nasal Swab  Result Value Ref Range Status   MRSA by PCR Next Gen NOT DETECTED NOT DETECTED Final    Comment: (NOTE) The GeneXpert MRSA Assay (FDA approved for NASAL specimens only), is one component of a comprehensive MRSA colonization surveillance program. It is not intended to diagnose MRSA infection nor to guide or monitor treatment for MRSA infections. Test performance is not FDA approved in patients less than 39  years old. Performed at [facility], [address] [place], [place] [address]   Expectorated Sputum Assessment w Gram Stain, Rflx to Resp Cult     Status: None   Collection Time: 05/17/23  9:36 AM   Specimen: Expectorated Sputum  Result Value Ref Range Status   Specimen Description EXPECTORATED SPUTUM  Final   Special Requests NONE  Final   Sputum evaluation   Final    THIS SPECIMEN IS ACCEPTABLE FOR SPUTUM CULTURE Performed at [facility], [address] [place], [place] [address]    Report Status 05/18/2023 FINAL  Final  Culture, Respiratory w Gram Stain     Status: None   Collection Time: 05/17/23  9:36 AM  Result Value Ref Range Status   Specimen Description EXPECTORATED SPUTUM  Final   Special Requests NONE Reflexed from [id number]  Final   Gram Stain   Final    FEW WBC PRESENT,BOTH PMN AND MONONUCLEAR MODERATE GRAM NEGATIVE RODS FEW YEAST WITH PSEUDOHYPHAE Performed at [facility], [address] [place], [place] [address]    Culture   Final    ABUNDANT STENOTROPHOMONAS MALTOPHILIA MODERATE CANDIDA ALBICANS    Report Status 05/20/2023 FINAL  Final   Organism ID, Bacteria STENOTROPHOMONAS MALTOPHILIA  Final      Susceptibility   Stenotrophomonas maltophilia - MIC*    LEVOFLOXACIN 1 SENSITIVE Sensitive     TRIMETH/SULFA <=20 SENSITIVE Sensitive     * ABUNDANT STENOTROPHOMONAS MALTOPHILIA    Labs: CBC: Recent Labs  Lab 05/15/23 0513 05/16/23 0441 05/17/23 0502 05/18/23 0546 05/21/23 0259  WBC 9.3 7.5 8.5 9.3 6.7  HGB 10.3* 10.2* 9.6* 10.1* 10.9*  HCT 32.2* 30.4* 28.8* 30.6* 33.7*  MCV 99.4 97.4 97.3 98.1 99.4  PLT 51* 58* 85* 116* 184   Basic Metabolic Panel: Recent Labs  Lab 05/16/23 0441 05/17/23 0502 05/18/23 0546 05/20/23 0805 05/21/23 0259  NA 144 144 140 140 140  K 4.1 3.4* 3.6 3.9 3.5  CL 112* 106 104 102 100  CO2 23 29 26 25 30   GLUCOSE 100* 83 102* 90 91  BUN 95* 88* 78* 54* 50*  CREATININE 2.27*  2.35* 2.19* 1.99*  2.13*  CALCIUM 8.6* 8.5* 8.5* 8.8* 8.5*  MG 2.3  --   --   --   --    Liver Function Tests: Recent Labs  Lab 05/18/23 0546 05/20/23 0805 05/21/23 0259  AST 53* 47* 40  ALT 300* 204* 171*  ALKPHOS 105 98 98  BILITOT 2.3* 2.1* 2.2*  PROT 5.2* 5.6* 5.8*  ALBUMIN 2.6* 2.9* 3.0*   CBG: Recent Labs  Lab 05/19/23 2105 05/20/23 1627 05/20/23 2132 05/21/23 0757 05/21/23 1204  GLUCAP 137* 92 120* 80 82    Discharge time spent: 35 minutes.  Signed: Marcelino Duster, MD [facility] 05/21/2023

## 2023-05-21 NOTE — Progress Notes (Signed)
 Mobility Specialist Progress Note:    05/21/23 1530  Mobility  Activity Ambulated with assistance in room  Level of Assistance Contact guard assist, steadying assist  Assistive Device Front wheel walker  Distance Ambulated (ft)  (5 lat steps)  Activity Response Tolerated well  Mobility Referral Yes  Mobility visit 1 Mobility  Mobility Specialist Start Time (ACUTE ONLY) 1040  Mobility Specialist Stop Time (ACUTE ONLY) 1052  Mobility Specialist Time Calculation (min) (ACUTE ONLY) 12 min   Pt received in bed agreeable to mobility. No physical assistance needed throughout session. Assisted in gown change. Took 5 lateral steps towards HOB. Distance limited d/t fear of nausea from pt. Returned to supine in bed. All needs met w/ call bell and personal belongings in reach. Bed alarm on.   Thompson Grayer Mobility Specialist  Please contact vis Secure Chat or  Rehab Office [phone]

## 2023-05-21 NOTE — TOC Transition Note (Signed)
 Transition of Care [facility]) - Discharge Note   Patient Details  Name: Heather [name] MRN: [id number] Date of Birth: 04-15-1940  [facility] CM/SW Contact:  Nicanor Bake Phone Number: [phone] 05/21/2023, 9:42 AM   Clinical Narrative:   HF CSW called and scheduled pts Hospital follow up appointment for Wednesday, May 30, 2023 at 11:00 AM.   HF CSW spoke with Erskine Squibb at [facility] who confirmed bed availability. The pts room will be ready today after 2pm. Her room # will be 119. The number to call for report is [phone].     Final next level of care: Skilled Nursing Facility Barriers to Discharge: No Barriers Identified   Patient Goals and CMS Choice            Discharge Placement                Patient to be transferred to facility by: [facility] Name of family member notified: Janne Lab   [phone] Patient and family notified of of transfer: 05/21/23  Discharge Plan and Services Additional resources added to the After Visit Summary for   In-house Referral: Clinical Social Work   Post Acute Care Choice: Skilled Nursing Facility                               Social Drivers of Health (SDOH) Interventions SDOH Screenings   Food Insecurity: Patient Declined (05/09/2023)  Housing: Patient Declined (05/09/2023)  Transportation Needs: Patient Unable To Answer (05/09/2023)  Utilities: Patient Unable To Answer (05/09/2023)  Alcohol Screen: Low Risk  (11/27/2022)  Depression (PHQ2-9): Low Risk  (05/02/2023)  Financial Resource Strain: Medium Risk (11/27/2022)  Physical Activity: Insufficiently Active (11/27/2022)  Social Connections: Patient Unable To Answer (05/09/2023)  Stress: Stress Concern Present (11/27/2022)  Tobacco Use: Medium Risk (05/08/2023)  Health Literacy: Adequate Health Literacy (11/09/2022)     Readmission Risk Interventions     No data to display

## 2023-05-21 NOTE — Care Management Important Message (Signed)
 Important Message  Patient Details  Name: MARAM BENTLY MRN: [id number] Date of Birth: 1940-07-04   Important Message Given:  Yes - Medicare IM     Renie Ora 05/21/2023, 11:31 AM

## 2023-05-21 NOTE — Progress Notes (Signed)
 Patient ID: EKTA DANCER, female   DOB: [birth year], 83 y.o.   MRN: [id number] Report called to [facility] and given to [facility] LPN.  Lidia Collum, RN

## 2023-05-21 NOTE — Progress Notes (Signed)
 Physical Therapy Treatment Patient Details Name: Heather [name] MRN: [id number] DOB: 06-23-1940 Today's Date: 05/21/2023   History of Present Illness 83 y/o woman admitted from [facility] 3/4 with confusion, hypoglycemia, CAP and shock after wellfare check. PMhx: HFrEF, HTN, COPD, DM    PT Comments  Pt reports plan for discharge to SNF today, but would like to get up and try to walk before she goes. Pt is supervision from bed mobility and contact guard to transfer from bed to recliner. Unfortunately, pt becomes dizzy and nauseous with transfer with slightly low BP, and ambulation deferred. After ~ 5 min pt able to open eyes, but defers ambulation today. Pt is agreeable to seated exercise.    If plan is discharge home, recommend the following: A little help with walking and/or transfers;Assistance with cooking/housework;Assist for transportation;A little help with bathing/dressing/bathroom;Help with stairs or ramp for entrance   Can travel by private vehicle     Yes  Equipment Recommendations  Rolling walker (2 wheels);BSC/3in1       Precautions / Restrictions Precautions Precautions: Fall Recall of Precautions/Restrictions: Intact Restrictions Weight Bearing Restrictions Per Provider Order: No     Mobility  Bed Mobility Overal bed mobility: Needs Assistance Bed Mobility: Supine to Sit, Sit to Supine     Supine to sit: HOB elevated, Supervision     General bed mobility comments: supervision to come to EoB    Transfers Overall transfer level: Needs assistance Equipment used: Rolling walker (2 wheels) Transfers: Sit to/from Stand Sit to Stand: Contact guard assist Stand pivot transfers: Contact guard assist         General transfer comment: contact guard for safety, vc for hand placement for power up, good power up and stepping from bed to recliner, pt with c/o of dizziness/lightheadedness with transfer to recliner    Ambulation/Gait               General Gait  Details: deferrred due to symptomatic hypotension         Balance Overall balance assessment: Mild deficits observed, not formally tested Sitting-balance support: No upper extremity supported, Feet supported Sitting balance-Leahy Scale: Fair     Standing balance support: Bilateral upper extremity supported Standing balance-Leahy Scale: Poor Standing balance comment: requires B UE support for dynamic balance                            Communication Communication Communication: No apparent difficulties  Cognition Arousal: Alert Behavior During Therapy: WFL for tasks assessed/performed   PT - Cognitive impairments: Orientation   Orientation impairments: Place, Time, Situation                   PT - Cognition Comments: Pt pleasantly confused throughout session. Re-oriented at begining of session and reassessed with no carryover noted. Following commands: Intact      Cueing Cueing Techniques: Verbal cues, Gestural cues  Exercises General Exercises - Lower Extremity Long Arc Quad: AROM, Both, 10 reps, Seated Hip Flexion/Marching: AROM, Both, 10 reps, Seated    General Comments General comments (skin integrity, edema, etc.): BP 103/72 with transfer pt with dizziness and nausea, pt prefers to keep eyes shut, with sitting 4 min BP 113/72 and pt comfortable opening eyes as nausea has passed      Pertinent Vitals/Pain Pain Assessment Pain Assessment: Faces Faces Pain Scale: Hurts a little bit Pain Location: stomach Pain Descriptors / Indicators: Aching Pain Intervention(s): Limited activity within patient's tolerance,  Monitored during session, Repositioned     PT Goals (current goals can now be found in the care plan section) Acute Rehab PT Goals PT Goal Formulation: Patient unable to participate in goal setting Time For Goal Achievement: 05/24/23 Potential to Achieve Goals: Fair Progress towards PT goals: Progressing toward goals    Frequency     Min 2X/week       AM-PAC PT "6 Clicks" Mobility   Outcome Measure  Help needed turning from your back to your side while in a flat bed without using bedrails?: A Little Help needed moving from lying on your back to sitting on the side of a flat bed without using bedrails?: A Little Help needed moving to and from a bed to a chair (including a wheelchair)?: A Little Help needed standing up from a chair using your arms (e.g., wheelchair or bedside chair)?: A Little Help needed to walk in hospital room?: A Little Help needed climbing 3-5 steps with a railing? : A Lot 6 Click Score: 17    End of Session Equipment Utilized During Treatment: Gait belt Activity Tolerance: Patient tolerated treatment well;Patient limited by fatigue Patient left: in chair;with call bell/phone within reach;with chair alarm set   PT Visit Diagnosis: Other abnormalities of gait and mobility (R26.89);Difficulty in walking, not elsewhere classified (R26.2);Muscle weakness (generalized) (M62.81)     Time: 4098-1191 PT Time Calculation (min) (ACUTE ONLY): 26 min  Charges:    $Therapeutic Exercise: 8-22 mins $Therapeutic Activity: 8-22 mins PT General Charges $$ ACUTE PT VISIT: 1 Visit                     Meshach Perry B. Beverely Risen PT, DPT Acute Rehabilitation Services Please use secure chat or  Call Office [phone]    Elon Al[facility] 05/21/2023, 11:39 AM

## 2023-05-21 NOTE — Progress Notes (Addendum)
 Advanced Heart Failure Rounding Note  Cardiologist: Vishnu Norton Pastel, MD  Chief Complaint: Heart Failure Subjective:    SNF transfer today.  Net negative 800cc. Weight down 4-5lbs.  + croupy cough.  Feeling well this morning. Slept well overnight. SOB improved.   Objective:    Weight Range: 66.5 kg Body mass index is 26.81 kg/m.   Vital Signs:   Temp:  [98.2 F (36.8 C)-99.1 F (37.3 C)] 98.4 F (36.9 C) (03/17 0759) Pulse Rate:  [77-82] 82 (03/17 0759) Resp:  [16-19] 19 (03/17 0759) BP: (104-124)/(56-75) 113/71 (03/17 0759) SpO2:  [95 %-100 %] 100 % (03/17 0759) Weight:  [66.5 kg] 66.5 kg (03/17 0500) Last BM Date : 05/19/23  Weight change: Filed Weights   05/19/23 0421 05/20/23 0412 05/21/23 0500  Weight: 70.1 kg 68.8 kg 66.5 kg   Intake/Output:  Intake/Output Summary (Last 24 hours) at 05/21/2023 0941 Last data filed at 05/21/2023 0400 Gross per 24 hour  Intake 240 ml  Output 1100 ml  Net -860 ml    Physical Exam   General: Frail appearing. No distress on RA HEENT: neck supple.   Cardiac: JVP ~12cm. S1 and S2 present. No murmurs or rub. Resp: course, crackles in bases Extremities: Warm and dry. Trace BLE edema.  Neuro: Alert and oriented to self and place. Affect pleasant. Generalized weakness  Telemetry   A-V pacing in 80-90s, occasional PVCs (personally reviewed)  EKG    N/A   Labs    CBC Recent Labs    05/21/23 0259  WBC 6.7  HGB 10.9*  HCT 33.7*  MCV 99.4  PLT 184   Basic Metabolic Panel Recent Labs    16/10/96 0805 05/21/23 0259  NA 140 140  K 3.9 3.5  CL 102 100  CO2 25 30  GLUCOSE 90 91  BUN 54* 50*  CREATININE 1.99* 2.13*  CALCIUM 8.8* 8.5*   Liver Function Tests Recent Labs    05/20/23 0805 05/21/23 0259  AST 47* 40  ALT 204* 171*  ALKPHOS 98 98  BILITOT 2.1* 2.2*  PROT 5.6* 5.8*  ALBUMIN 2.9* 3.0*   BNP: BNP (last 3 results) Recent Labs    04/09/23 1144 05/08/23 2255 05/15/23 0513  BNP  >4,500.0* >4,500.0* >4,500.0*   Imaging   No results found.  Medications:    Scheduled Medications:  allopurinol  150 mg Oral Daily   arformoterol  15 mcg Nebulization BID   budesonide (PULMICORT) nebulizer solution  0.5 mg Nebulization BID   famotidine  10 mg Oral Daily   feeding supplement  237 mL Oral BID BM   melatonin  3 mg Oral QHS   mouth rinse  15 mL Mouth Rinse 4 times per day   polyethylene glycol  17 g Oral Daily   polyvinyl alcohol  1 drop Both Eyes BID   revefenacin  175 mcg Nebulization Daily   torsemide  60 mg Oral Daily   venlafaxine  37.5 mg Oral BID WC   Infusions:  PRN Medications: acetaminophen, docusate, guaiFENesin-dextromethorphan, ipratropium-albuterol, mouth rinse, phenol, white petrolatum  Patient Profile   83 y/o female w/ 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 Septic shock 2/2 PNA.   Assessment/Plan   1. Acute on Chronic Systolic Heart Failure Nonischemic cardiomyopathy. Cath in 8/23 with no significant CAD, CI 2.24. ?LBBB cardiomyopathy versus familial versus prior myocarditis. Cardiac MRI showed LV severely dilated with EF 16% and  septal-lateral dyssynchrony, RV moderately dilated with EF 23%, mid-apical inferolateral LGE in prior infarction pattern. However, CAD does not explain the extent of her cardiomyopathy based on cath. Echo in 5/23 showed EF <20%, moderate RV dysfunction, PASP 65 mmHg. Underwent CRT-P 5/24. Echo 9/24 showed that EF is still 20-25%. She was noted to only be BiV pacing 87% of the time, frequent PVCs noted on ECG. Concern that PVCs were decreasing her BiV % and worsen LV function. - Admitted w/ mixed septic and cardiogenic shock. Overall improved and off NE and Milrinone. Diuresed well. - Continue torsemide 60 mg daily.  - GDMT limited by elevated SCr/AKI  - resume amio 200mg  daily for PVC suppression, prev held for shock liver (now  improved)  2. Acute Hypoxic Respiratory Failure - improved  - completed doxy + ceftriaxone for PNA - inhalers for COPD per IM - continue daily diuresis  3. AKI on CKD IIIb - baseline SCr ~2.2 - SCr peaked to 4.87 this admit, in setting of shock - stable at baseline    4. Shock Liver - improving - AST 2,916>236>53>40 - ALT 2,618>980 >300>171  5. Thrombocytopenia - resolved.   6. Hypernatremia - resolved.   7. Hypokalemia - K 3.5, supp  8. GOC: given multiple co morbidities, frailty and declining condition, recommend palliative care referral at discharge  9. Deconditioning - SNF transfer today  Heart failure team will sign off as of 05/21/23  HF Team Medication Recommendations for SNF: Torsemide 60 mg daily Amio 200 mg daily  Follow up as an outpatient with AHF.   Length of Stay: 16  [place] Lee, NP  05/21/2023, 9:41 AM  Advanced Heart Failure Team Pager [phone] (M-F; 7a - 5p)  Please contact [facility] for night-coverage after hours (5p -7a ) and weekends on [email or web address]  Patient seen with NP, I formulated the plan and agree with the above note.   Stable today.  GDMT limited by AKI/CKD.   Agree with torsemide 60 mg daily and amiodarone 200 daily to suppress PVCs.   Planned for discharge. We will arrange followup.   Marca Ancona 05/21/2023 12:56 PM

## 2023-05-21 NOTE — TOC Progression Note (Signed)
 Transition of Care [facility]) - Progression Note    Patient Details  Name: Heather [name] MRN: [id number] Date of Birth: 16-Apr-1940  [facility] CM/SW Contact  Reva Bores, LCSWA Phone Number: 05/21/2023, 9:02 AM  Clinical Narrative:   HF CSW received a VM from pts insurance. Pts insurance and transport was approved.   Research scientist (life sciences) # [id number] or [id number]  Ambulance # [phone] or [phone].   9:04 AM- HF CSW called and informed pt of there insurance auth approval.   9:08 AM- HF CSW called the pts son and left a VM.   9:10 AM- HF CSW received a call back from pts son. CSW informed the pts son that  she is medically ready for dc per MD and insurance auth was approved and the pt will be transported by [facility] to the SNF today. Pt son agrees.   TOC will continue following.     Expected Discharge Plan: Skilled Nursing Facility Barriers to Discharge: Continued Medical Work up, SNF Pending bed offer  Expected Discharge Plan and Services In-house Referral: Clinical Social Work   Post Acute Care Choice: Skilled Nursing Facility Living arrangements for the past 2 months: Apartment                                       Social Determinants of Health (SDOH) Interventions SDOH Screenings   Food Insecurity: Patient Declined (05/09/2023)  Housing: Patient Declined (05/09/2023)  Transportation Needs: Patient Unable To Answer (05/09/2023)  Utilities: Patient Unable To Answer (05/09/2023)  Alcohol Screen: Low Risk  (11/27/2022)  Depression (PHQ2-9): Low Risk  (05/02/2023)  Financial Resource Strain: Medium Risk (11/27/2022)  Physical Activity: Insufficiently Active (11/27/2022)  Social Connections: Patient Unable To Answer (05/09/2023)  Stress: Stress Concern Present (11/27/2022)  Tobacco Use: Medium Risk (05/08/2023)  Health Literacy: Adequate Health Literacy (11/09/2022)    Readmission Risk Interventions     No data to display

## 2023-05-21 NOTE — Plan of Care (Signed)
 Patient ID: Heather [name], female   DOB: Apr 15, 1940, 83 y.o.   MRN: [id number]  Problem: Education: Goal: Knowledge of General Education information will improve Description: Including pain rating scale, medication(s)/side effects and non-pharmacologic comfort measures Outcome: Adequate for Discharge   Problem: Health Behavior/Discharge Planning: Goal: Ability to manage health-related needs will improve Outcome: Adequate for Discharge   Problem: Clinical Measurements: Goal: Ability to maintain clinical measurements within normal limits will improve Outcome: Adequate for Discharge Goal: Will remain free from infection Outcome: Adequate for Discharge Goal: Diagnostic test results will improve Outcome: Adequate for Discharge Goal: Respiratory complications will improve Outcome: Adequate for Discharge Goal: Cardiovascular complication will be avoided Outcome: Adequate for Discharge   Problem: Activity: Goal: Risk for activity intolerance will decrease Outcome: Adequate for Discharge   Problem: Nutrition: Goal: Adequate nutrition will be maintained Outcome: Adequate for Discharge   Problem: Coping: Goal: Level of anxiety will decrease Outcome: Adequate for Discharge   Problem: Elimination: Goal: Will not experience complications related to bowel motility Outcome: Adequate for Discharge Goal: Will not experience complications related to urinary retention Outcome: Adequate for Discharge   Problem: Pain Managment: Goal: General experience of comfort will improve and/or be controlled Outcome: Adequate for Discharge   Problem: Safety: Goal: Ability to remain free from injury will improve Outcome: Adequate for Discharge   Problem: Skin Integrity: Goal: Risk for impaired skin integrity will decrease Outcome: Adequate for Discharge   Problem: Education: Goal: Ability to describe self-care measures that may prevent or decrease complications (Diabetes Survival Skills Education)  will improve Outcome: Adequate for Discharge Goal: Individualized Educational Video(s) Outcome: Adequate for Discharge   Problem: Coping: Goal: Ability to adjust to condition or change in health will improve Outcome: Adequate for Discharge   Problem: Fluid Volume: Goal: Ability to maintain a balanced intake and output will improve Outcome: Adequate for Discharge   Problem: Health Behavior/Discharge Planning: Goal: Ability to identify and utilize available resources and services will improve Outcome: Adequate for Discharge Goal: Ability to manage health-related needs will improve Outcome: Adequate for Discharge   Problem: Metabolic: Goal: Ability to maintain appropriate glucose levels will improve Outcome: Adequate for Discharge   Problem: Nutritional: Goal: Maintenance of adequate nutrition will improve Outcome: Adequate for Discharge Goal: Progress toward achieving an optimal weight will improve Outcome: Adequate for Discharge   Problem: Skin Integrity: Goal: Risk for impaired skin integrity will decrease Outcome: Adequate for Discharge   Problem: Tissue Perfusion: Goal: Adequacy of tissue perfusion will improve Outcome: Adequate for Discharge   Problem: Acute Rehab PT Goals(only PT should resolve) Goal: Pt Will Go Supine/Side To Sit Outcome: Adequate for Discharge Goal: Patient Will Transfer Sit To/From Stand Outcome: Adequate for Discharge Goal: Pt Will Transfer Bed To Chair/Chair To Bed Outcome: Adequate for Discharge Goal: Pt Will Ambulate Outcome: Adequate for Discharge Goal: Pt/caregiver will Perform Home Exercise Program Outcome: Adequate for Discharge   Problem: SLP Dysphagia Goals Goal: Patient will demonstrate readiness for PO's Description: Patient will demonstrate readiness for PO's and/or instrumental swallow study as evidenced by: Outcome: Adequate for Discharge   Problem: Safety: Goal: Non-violent Restraint(s) Outcome: Adequate for Discharge    Problem: Malnutrition  (NI-5.2) Goal: Food and/or nutrient delivery Description: Individualized approach for food/nutrient provision. Outcome: Adequate for Discharge   Problem: Acute Rehab OT Goals (only OT should resolve) Goal: Pt. Will Perform Grooming Outcome: Adequate for Discharge Goal: Pt. Will Perform Upper Body Bathing Outcome: Adequate for Discharge Goal: Pt. Will Perform Lower Body Bathing Outcome: Adequate  for Discharge Goal: Pt. Will Perform Upper Body Dressing Outcome: Adequate for Discharge Goal: Pt. Will Transfer To Toilet Outcome: Adequate for Discharge Goal: Pt. Will Perform Toileting-Clothing Manipulation Outcome: Adequate for Discharge    Lidia Collum, RN

## 2023-05-22 DIAGNOSIS — A419 Sepsis, unspecified organism: Secondary | ICD-10-CM | POA: Diagnosis not present

## 2023-05-22 DIAGNOSIS — R059 Cough, unspecified: Secondary | ICD-10-CM | POA: Diagnosis not present

## 2023-05-22 DIAGNOSIS — E1122 Type 2 diabetes mellitus with diabetic chronic kidney disease: Secondary | ICD-10-CM | POA: Diagnosis not present

## 2023-05-22 DIAGNOSIS — E119 Type 2 diabetes mellitus without complications: Secondary | ICD-10-CM | POA: Diagnosis not present

## 2023-05-22 DIAGNOSIS — E785 Hyperlipidemia, unspecified: Secondary | ICD-10-CM | POA: Diagnosis not present

## 2023-05-22 DIAGNOSIS — Z4682 Encounter for fitting and adjustment of non-vascular catheter: Secondary | ICD-10-CM | POA: Diagnosis not present

## 2023-05-22 DIAGNOSIS — R52 Pain, unspecified: Secondary | ICD-10-CM | POA: Diagnosis not present

## 2023-05-22 DIAGNOSIS — R0902 Hypoxemia: Secondary | ICD-10-CM | POA: Diagnosis not present

## 2023-05-22 DIAGNOSIS — F419 Anxiety disorder, unspecified: Secondary | ICD-10-CM | POA: Diagnosis not present

## 2023-05-22 DIAGNOSIS — J90 Pleural effusion, not elsewhere classified: Secondary | ICD-10-CM | POA: Diagnosis not present

## 2023-05-22 DIAGNOSIS — F4321 Adjustment disorder with depressed mood: Secondary | ICD-10-CM | POA: Diagnosis not present

## 2023-05-22 DIAGNOSIS — E876 Hypokalemia: Secondary | ICD-10-CM | POA: Diagnosis not present

## 2023-05-22 DIAGNOSIS — B952 Enterococcus as the cause of diseases classified elsewhere: Secondary | ICD-10-CM | POA: Diagnosis not present

## 2023-05-22 DIAGNOSIS — M6281 Muscle weakness (generalized): Secondary | ICD-10-CM | POA: Diagnosis not present

## 2023-05-22 DIAGNOSIS — I5022 Chronic systolic (congestive) heart failure: Secondary | ICD-10-CM | POA: Diagnosis not present

## 2023-05-22 DIAGNOSIS — I509 Heart failure, unspecified: Secondary | ICD-10-CM | POA: Diagnosis not present

## 2023-05-22 DIAGNOSIS — D649 Anemia, unspecified: Secondary | ICD-10-CM | POA: Diagnosis not present

## 2023-05-22 DIAGNOSIS — R7401 Elevation of levels of liver transaminase levels: Secondary | ICD-10-CM | POA: Diagnosis not present

## 2023-05-22 DIAGNOSIS — I959 Hypotension, unspecified: Secondary | ICD-10-CM | POA: Diagnosis not present

## 2023-05-22 DIAGNOSIS — I10 Essential (primary) hypertension: Secondary | ICD-10-CM | POA: Diagnosis not present

## 2023-05-22 DIAGNOSIS — N184 Chronic kidney disease, stage 4 (severe): Secondary | ICD-10-CM | POA: Diagnosis not present

## 2023-05-22 DIAGNOSIS — E162 Hypoglycemia, unspecified: Secondary | ICD-10-CM | POA: Diagnosis not present

## 2023-05-22 DIAGNOSIS — R7881 Bacteremia: Secondary | ICD-10-CM | POA: Diagnosis not present

## 2023-05-22 DIAGNOSIS — I639 Cerebral infarction, unspecified: Secondary | ICD-10-CM | POA: Diagnosis not present

## 2023-05-22 DIAGNOSIS — R918 Other nonspecific abnormal finding of lung field: Secondary | ICD-10-CM | POA: Diagnosis not present

## 2023-05-22 DIAGNOSIS — R404 Transient alteration of awareness: Secondary | ICD-10-CM | POA: Diagnosis not present

## 2023-05-22 DIAGNOSIS — E11649 Type 2 diabetes mellitus with hypoglycemia without coma: Secondary | ICD-10-CM | POA: Diagnosis not present

## 2023-05-22 DIAGNOSIS — I110 Hypertensive heart disease with heart failure: Secondary | ICD-10-CM | POA: Diagnosis not present

## 2023-05-22 DIAGNOSIS — N179 Acute kidney failure, unspecified: Secondary | ICD-10-CM | POA: Diagnosis not present

## 2023-05-22 DIAGNOSIS — F411 Generalized anxiety disorder: Secondary | ICD-10-CM | POA: Diagnosis not present

## 2023-05-22 DIAGNOSIS — R1312 Dysphagia, oropharyngeal phase: Secondary | ICD-10-CM | POA: Diagnosis not present

## 2023-05-22 DIAGNOSIS — J449 Chronic obstructive pulmonary disease, unspecified: Secondary | ICD-10-CM | POA: Diagnosis not present

## 2023-05-22 DIAGNOSIS — K219 Gastro-esophageal reflux disease without esophagitis: Secondary | ICD-10-CM | POA: Diagnosis not present

## 2023-05-22 DIAGNOSIS — R058 Other specified cough: Secondary | ICD-10-CM | POA: Diagnosis not present

## 2023-05-22 DIAGNOSIS — I469 Cardiac arrest, cause unspecified: Secondary | ICD-10-CM | POA: Diagnosis present

## 2023-05-22 DIAGNOSIS — R7989 Other specified abnormal findings of blood chemistry: Secondary | ICD-10-CM | POA: Diagnosis not present

## 2023-05-22 DIAGNOSIS — R001 Bradycardia, unspecified: Secondary | ICD-10-CM | POA: Diagnosis not present

## 2023-05-22 DIAGNOSIS — R0989 Other specified symptoms and signs involving the circulatory and respiratory systems: Secondary | ICD-10-CM | POA: Diagnosis not present

## 2023-05-23 DIAGNOSIS — F411 Generalized anxiety disorder: Secondary | ICD-10-CM | POA: Diagnosis not present

## 2023-05-23 DIAGNOSIS — F4321 Adjustment disorder with depressed mood: Secondary | ICD-10-CM | POA: Diagnosis not present

## 2023-05-25 DIAGNOSIS — I5022 Chronic systolic (congestive) heart failure: Secondary | ICD-10-CM | POA: Diagnosis not present

## 2023-05-25 DIAGNOSIS — R058 Other specified cough: Secondary | ICD-10-CM | POA: Diagnosis not present

## 2023-05-26 DIAGNOSIS — R059 Cough, unspecified: Secondary | ICD-10-CM | POA: Diagnosis not present

## 2023-05-28 DIAGNOSIS — I639 Cerebral infarction, unspecified: Secondary | ICD-10-CM | POA: Diagnosis not present

## 2023-05-28 DIAGNOSIS — F419 Anxiety disorder, unspecified: Secondary | ICD-10-CM | POA: Diagnosis not present

## 2023-05-28 DIAGNOSIS — N184 Chronic kidney disease, stage 4 (severe): Secondary | ICD-10-CM | POA: Diagnosis not present

## 2023-05-28 DIAGNOSIS — R058 Other specified cough: Secondary | ICD-10-CM | POA: Diagnosis not present

## 2023-05-28 DIAGNOSIS — E785 Hyperlipidemia, unspecified: Secondary | ICD-10-CM | POA: Diagnosis not present

## 2023-05-28 DIAGNOSIS — K219 Gastro-esophageal reflux disease without esophagitis: Secondary | ICD-10-CM | POA: Diagnosis not present

## 2023-05-28 DIAGNOSIS — I10 Essential (primary) hypertension: Secondary | ICD-10-CM | POA: Diagnosis not present

## 2023-05-28 DIAGNOSIS — J449 Chronic obstructive pulmonary disease, unspecified: Secondary | ICD-10-CM | POA: Diagnosis not present

## 2023-05-29 DIAGNOSIS — E876 Hypokalemia: Secondary | ICD-10-CM | POA: Diagnosis not present

## 2023-05-29 DIAGNOSIS — R7401 Elevation of levels of liver transaminase levels: Secondary | ICD-10-CM | POA: Diagnosis not present

## 2023-05-30 ENCOUNTER — Inpatient Hospital Stay: Payer: Self-pay | Admitting: Family Medicine

## 2023-06-03 ENCOUNTER — Emergency Department (HOSPITAL_COMMUNITY)
Admission: EM | Admit: 2023-06-03 | Discharge: 2023-06-05 | Disposition: E | Attending: Emergency Medicine | Admitting: Emergency Medicine

## 2023-06-03 ENCOUNTER — Emergency Department (HOSPITAL_COMMUNITY)

## 2023-06-03 ENCOUNTER — Other Ambulatory Visit: Payer: Self-pay

## 2023-06-03 DIAGNOSIS — R7881 Bacteremia: Secondary | ICD-10-CM | POA: Diagnosis not present

## 2023-06-03 DIAGNOSIS — E11649 Type 2 diabetes mellitus with hypoglycemia without coma: Secondary | ICD-10-CM | POA: Insufficient documentation

## 2023-06-03 DIAGNOSIS — I509 Heart failure, unspecified: Secondary | ICD-10-CM | POA: Diagnosis not present

## 2023-06-03 DIAGNOSIS — R0902 Hypoxemia: Secondary | ICD-10-CM | POA: Diagnosis not present

## 2023-06-03 DIAGNOSIS — J449 Chronic obstructive pulmonary disease, unspecified: Secondary | ICD-10-CM | POA: Insufficient documentation

## 2023-06-03 DIAGNOSIS — I110 Hypertensive heart disease with heart failure: Secondary | ICD-10-CM | POA: Diagnosis not present

## 2023-06-03 DIAGNOSIS — R918 Other nonspecific abnormal finding of lung field: Secondary | ICD-10-CM | POA: Diagnosis not present

## 2023-06-03 DIAGNOSIS — Z4682 Encounter for fitting and adjustment of non-vascular catheter: Secondary | ICD-10-CM | POA: Diagnosis not present

## 2023-06-03 DIAGNOSIS — D649 Anemia, unspecified: Secondary | ICD-10-CM | POA: Insufficient documentation

## 2023-06-03 DIAGNOSIS — R7989 Other specified abnormal findings of blood chemistry: Secondary | ICD-10-CM | POA: Insufficient documentation

## 2023-06-03 DIAGNOSIS — R404 Transient alteration of awareness: Secondary | ICD-10-CM | POA: Diagnosis not present

## 2023-06-03 DIAGNOSIS — I959 Hypotension, unspecified: Secondary | ICD-10-CM | POA: Diagnosis not present

## 2023-06-03 DIAGNOSIS — B952 Enterococcus as the cause of diseases classified elsewhere: Secondary | ICD-10-CM | POA: Insufficient documentation

## 2023-06-03 DIAGNOSIS — R0989 Other specified symptoms and signs involving the circulatory and respiratory systems: Secondary | ICD-10-CM | POA: Diagnosis not present

## 2023-06-03 DIAGNOSIS — J90 Pleural effusion, not elsewhere classified: Secondary | ICD-10-CM | POA: Insufficient documentation

## 2023-06-03 DIAGNOSIS — E162 Hypoglycemia, unspecified: Secondary | ICD-10-CM | POA: Diagnosis not present

## 2023-06-03 DIAGNOSIS — R001 Bradycardia, unspecified: Secondary | ICD-10-CM | POA: Diagnosis not present

## 2023-06-03 DIAGNOSIS — I469 Cardiac arrest, cause unspecified: Secondary | ICD-10-CM | POA: Diagnosis not present

## 2023-06-03 LAB — CBC WITH DIFFERENTIAL/PLATELET
Abs Immature Granulocytes: 0.5 10*3/uL — ABNORMAL HIGH (ref 0.00–0.07)
Basophils Absolute: 0.1 10*3/uL (ref 0.0–0.1)
Basophils Relative: 1 %
Eosinophils Absolute: 0.1 10*3/uL (ref 0.0–0.5)
Eosinophils Relative: 1 %
HCT: 37.8 % (ref 36.0–46.0)
Hemoglobin: 10.9 g/dL — ABNORMAL LOW (ref 12.0–15.0)
Immature Granulocytes: 6 %
Lymphocytes Relative: 18 %
Lymphs Abs: 1.5 10*3/uL (ref 0.7–4.0)
MCH: 32.3 pg (ref 26.0–34.0)
MCHC: 28.8 g/dL — ABNORMAL LOW (ref 30.0–36.0)
MCV: 112.2 fL — ABNORMAL HIGH (ref 80.0–100.0)
Monocytes Absolute: 0.3 10*3/uL (ref 0.1–1.0)
Monocytes Relative: 4 %
Neutro Abs: 6.2 10*3/uL (ref 1.7–7.7)
Neutrophils Relative %: 70 %
Platelets: 46 10*3/uL — ABNORMAL LOW (ref 150–400)
RBC: 3.37 MIL/uL — ABNORMAL LOW (ref 3.87–5.11)
RDW: 18.4 % — ABNORMAL HIGH (ref 11.5–15.5)
Smear Review: DECREASED
WBC: 8.7 10*3/uL (ref 4.0–10.5)
nRBC: 0.7 % — ABNORMAL HIGH (ref 0.0–0.2)

## 2023-06-03 LAB — I-STAT CHEM 8, ED
BUN: 31 mg/dL — ABNORMAL HIGH (ref 8–23)
Calcium, Ion: 0.67 mmol/L — CL (ref 1.15–1.40)
Chloride: 108 mmol/L (ref 98–111)
Creatinine, Ser: 2.1 mg/dL — ABNORMAL HIGH (ref 0.44–1.00)
Glucose, Bld: 501 mg/dL (ref 70–99)
HCT: 15 % — ABNORMAL LOW (ref 36.0–46.0)
Hemoglobin: 5.1 g/dL — CL (ref 12.0–15.0)
Potassium: 3.7 mmol/L (ref 3.5–5.1)
Sodium: 139 mmol/L (ref 135–145)
TCO2: 13 mmol/L — ABNORMAL LOW (ref 22–32)

## 2023-06-03 LAB — BRAIN NATRIURETIC PEPTIDE: B Natriuretic Peptide: >4500 pg/mL — ABNORMAL HIGH (ref 0.0–100.0)

## 2023-06-03 LAB — COMPREHENSIVE METABOLIC PANEL WITH GFR
ALT: 213 U/L — ABNORMAL HIGH (ref 0–44)
AST: 616 U/L — ABNORMAL HIGH (ref 15–41)
Albumin: 2 g/dL — ABNORMAL LOW (ref 3.5–5.0)
Alkaline Phosphatase: 80 U/L (ref 38–126)
Anion gap: 33 — ABNORMAL HIGH (ref 5–15)
BUN: 35 mg/dL — ABNORMAL HIGH (ref 8–23)
CO2: 16 mmol/L — ABNORMAL LOW (ref 22–32)
Calcium: 14.2 mg/dL (ref 8.9–10.3)
Chloride: 96 mmol/L — ABNORMAL LOW (ref 98–111)
Creatinine, Ser: 3.66 mg/dL — ABNORMAL HIGH (ref 0.44–1.00)
GFR, Estimated: 12 mL/min — ABNORMAL LOW (ref >60)
Glucose, Bld: 146 mg/dL — ABNORMAL HIGH (ref 70–99)
Potassium: 4.5 mmol/L (ref 3.5–5.1)
Sodium: 145 mmol/L (ref 135–145)
Total Bilirubin: 1.5 mg/dL — ABNORMAL HIGH (ref 0.0–1.2)
Total Protein: 4.6 g/dL — ABNORMAL LOW (ref 6.5–8.1)

## 2023-06-03 LAB — CORTISOL: Cortisol, Plasma: 72.3 ug/dL

## 2023-06-03 LAB — TROPONIN I (HIGH SENSITIVITY): Troponin I (High Sensitivity): 2286 ng/L (ref <18)

## 2023-06-03 LAB — I-STAT CG4 LACTIC ACID, ED: Lactic Acid, Venous: 11.3 mmol/L (ref 0.5–1.9)

## 2023-06-03 LAB — LIPASE, BLOOD: Lipase: 82 U/L — ABNORMAL HIGH (ref 11–51)

## 2023-06-03 LAB — TYPE AND SCREEN
ABO/RH(D): A POS
Antibody Screen: NEGATIVE

## 2023-06-03 LAB — CBG MONITORING, ED
Glucose-Capillary: 131 mg/dL — ABNORMAL HIGH (ref 70–99)
Glucose-Capillary: 136 mg/dL — ABNORMAL HIGH (ref 70–99)

## 2023-06-03 MED ORDER — NALOXONE HCL 0.4 MG/ML IJ SOLN
INTRAMUSCULAR | Status: AC
Start: 2023-06-03 — End: ?
  Filled 2023-06-03: qty 1

## 2023-06-03 MED ORDER — CALCIUM CHLORIDE 10 % IV SOLN
INTRAVENOUS | Status: DC | PRN
  Administered 2023-06-03: 1 g via INTRAVENOUS

## 2023-06-03 MED ORDER — PROPOFOL 1000 MG/100ML IV EMUL
5.0000 ug/kg/min | INTRAVENOUS | Status: DC
Start: 2023-06-03 — End: 2023-06-03

## 2023-06-03 MED ORDER — LACTATED RINGERS IV BOLUS
1000.0000 mL | Freq: Once | INTRAVENOUS | Status: AC
  Administered 2023-06-03: 1000 mL via INTRAVENOUS

## 2023-06-03 MED ORDER — EPINEPHRINE 1 MG/10ML IJ SOSY
PREFILLED_SYRINGE | INTRAMUSCULAR | Status: DC | PRN
  Administered 2023-06-03 (×2): 1 mg via INTRAVENOUS

## 2023-06-03 MED ORDER — PROPOFOL 1000 MG/100ML IV EMUL
INTRAVENOUS | Status: AC
  Filled 2023-06-03: qty 100

## 2023-06-03 MED ORDER — NOREPINEPHRINE 4 MG/250ML-% IV SOLN
0.0000 ug/min | INTRAVENOUS | Status: DC
Start: 2023-06-03 — End: 2023-06-03
  Administered 2023-06-03: 2 ug/min via INTRAVENOUS

## 2023-06-03 MED ORDER — SODIUM BICARBONATE 8.4 % IV SOLN
INTRAVENOUS | Status: DC | PRN
  Administered 2023-06-03: 50 meq via INTRAVENOUS

## 2023-06-03 MED ORDER — DEXAMETHASONE SODIUM PHOSPHATE 4 MG/ML IJ SOLN
4.0000 mg | Freq: Once | INTRAMUSCULAR | Status: DC
  Filled 2023-06-03: qty 1

## 2023-06-03 MED ORDER — NOREPINEPHRINE 4 MG/250ML-% IV SOLN: 0.0000 ug/min | INTRAVENOUS | Status: DC

## 2023-06-03 MED ORDER — SODIUM CHLORIDE 0.9 % IV SOLN: 250.0000 mL | INTRAVENOUS | Status: DC

## 2023-06-04 ENCOUNTER — Telehealth: Payer: Self-pay | Admitting: Internal Medicine

## 2023-06-04 LAB — BLOOD CULTURE ID PANEL (REFLEXED) - BCID2

## 2023-06-04 NOTE — Telephone Encounter (Signed)
 ER called after hours  Report pt death  Death certificate needs signature

## 2023-06-05 NOTE — ED Notes (Signed)
Time of death 0903

## 2023-06-05 NOTE — ED Notes (Signed)
 [place] PCP-P.Dixon called to complete DC given Dr. Maple Hudson contact #

## 2023-06-05 NOTE — ED Triage Notes (Addendum)
 Patient arrived by [facility] from [facility] after being found unresponsive by staff. EMS reports CBG 31 and D10 hung. Initial BP 80/50 and repeat BP 132/70. Ems initially began pacing prior to arrival and pacing discontinued pta. Patient with no movement, arrived with GCS 3. Upon primary assessment no pulses on and CPR started.

## 2023-06-05 NOTE — Progress Notes (Signed)
 Per ED MD order, RT extubated pt to room air/comfort care. RN at bedside during that time.

## 2023-06-05 NOTE — ED Notes (Signed)
 Pt family requested to withdraw care.

## 2023-06-05 DEATH — deceased

## 2023-06-06 LAB — CULTURE, BLOOD (ROUTINE X 2)

## 2023-06-11 ENCOUNTER — Encounter (HOSPITAL_COMMUNITY)

## 2023-06-12 ENCOUNTER — Ambulatory Visit: Payer: HMO | Admitting: Internal Medicine

## 2023-06-25 ENCOUNTER — Ambulatory Visit: Payer: PPO | Admitting: Registered Nurse

## 2023-08-23 ENCOUNTER — Ambulatory Visit: Payer: PPO | Admitting: Physical Medicine & Rehabilitation

## 2023-11-22 ENCOUNTER — Inpatient Hospital Stay: Admitting: Adult Health
